# Patient Record
Sex: Male | Born: 1935
Health system: Southern US, Community
[De-identification: ages and names within clinical notes are randomized; demographics above are authoritative.]

## PROBLEM LIST (undated history)

## (undated) DIAGNOSIS — D649 Anemia, unspecified: Secondary | ICD-10-CM

## (undated) DIAGNOSIS — N189 Chronic kidney disease, unspecified: Secondary | ICD-10-CM

## (undated) DIAGNOSIS — G709 Myoneural disorder, unspecified: Secondary | ICD-10-CM

## (undated) DIAGNOSIS — F329 Major depressive disorder, single episode, unspecified: Secondary | ICD-10-CM

## (undated) DIAGNOSIS — I10 Essential (primary) hypertension: Secondary | ICD-10-CM

## (undated) DIAGNOSIS — C801 Malignant (primary) neoplasm, unspecified: Secondary | ICD-10-CM

## (undated) DIAGNOSIS — F32A Depression, unspecified: Secondary | ICD-10-CM

## (undated) HISTORY — PX: EYE SURGERY: SHX253

## (undated) MED FILL — Iron Sucrose Inj 20 MG/ML (Fe Equiv): INTRAVENOUS | Qty: 10 | Status: AC

---

## 2006-08-23 ENCOUNTER — Ambulatory Visit: Payer: Self-pay

## 2006-08-23 IMAGING — CR DG CHEST 2V
1 series · 3 of 3 positions shown · non-contrast
Comparison: none

REASON FOR EXAM: COUGH, FEVER, WEAKNESS
COMMENTS:

[Series 1: view not recorded · 0.17mm/px · 3 of 3 slices shown]
[im 1/3]
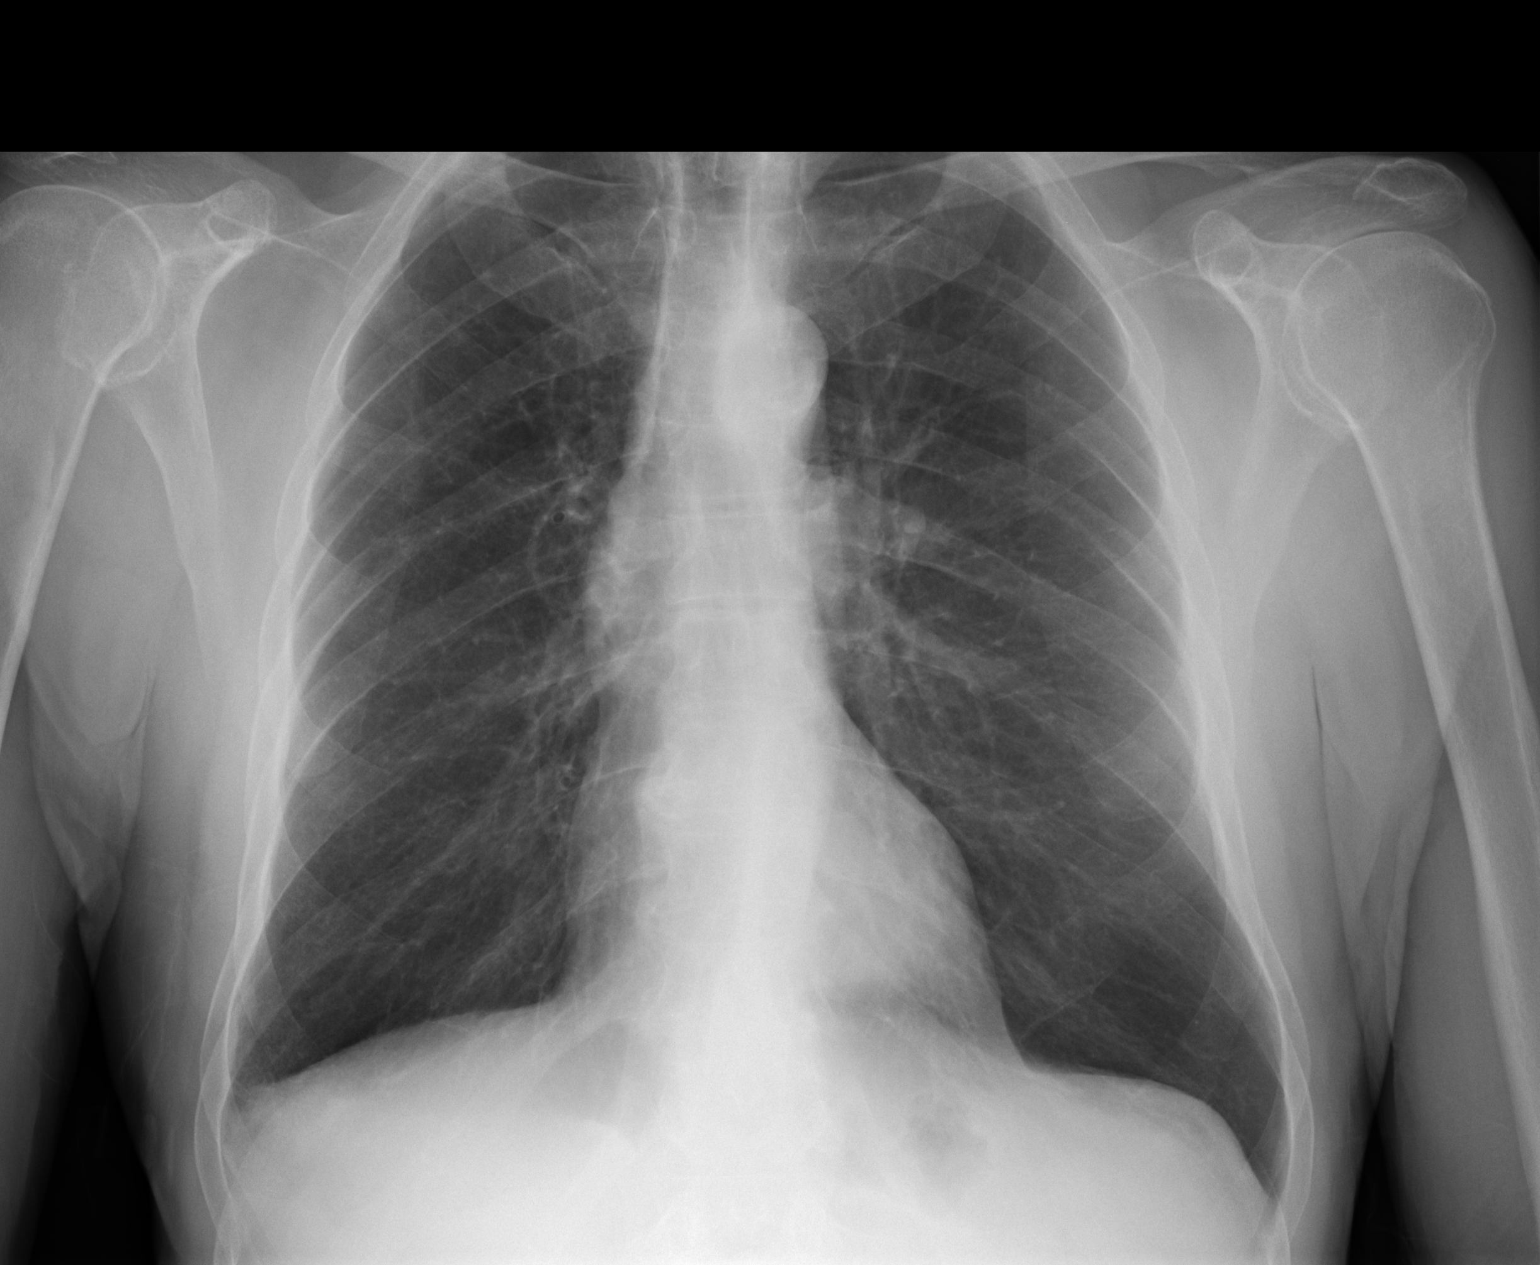
[im 2/3]
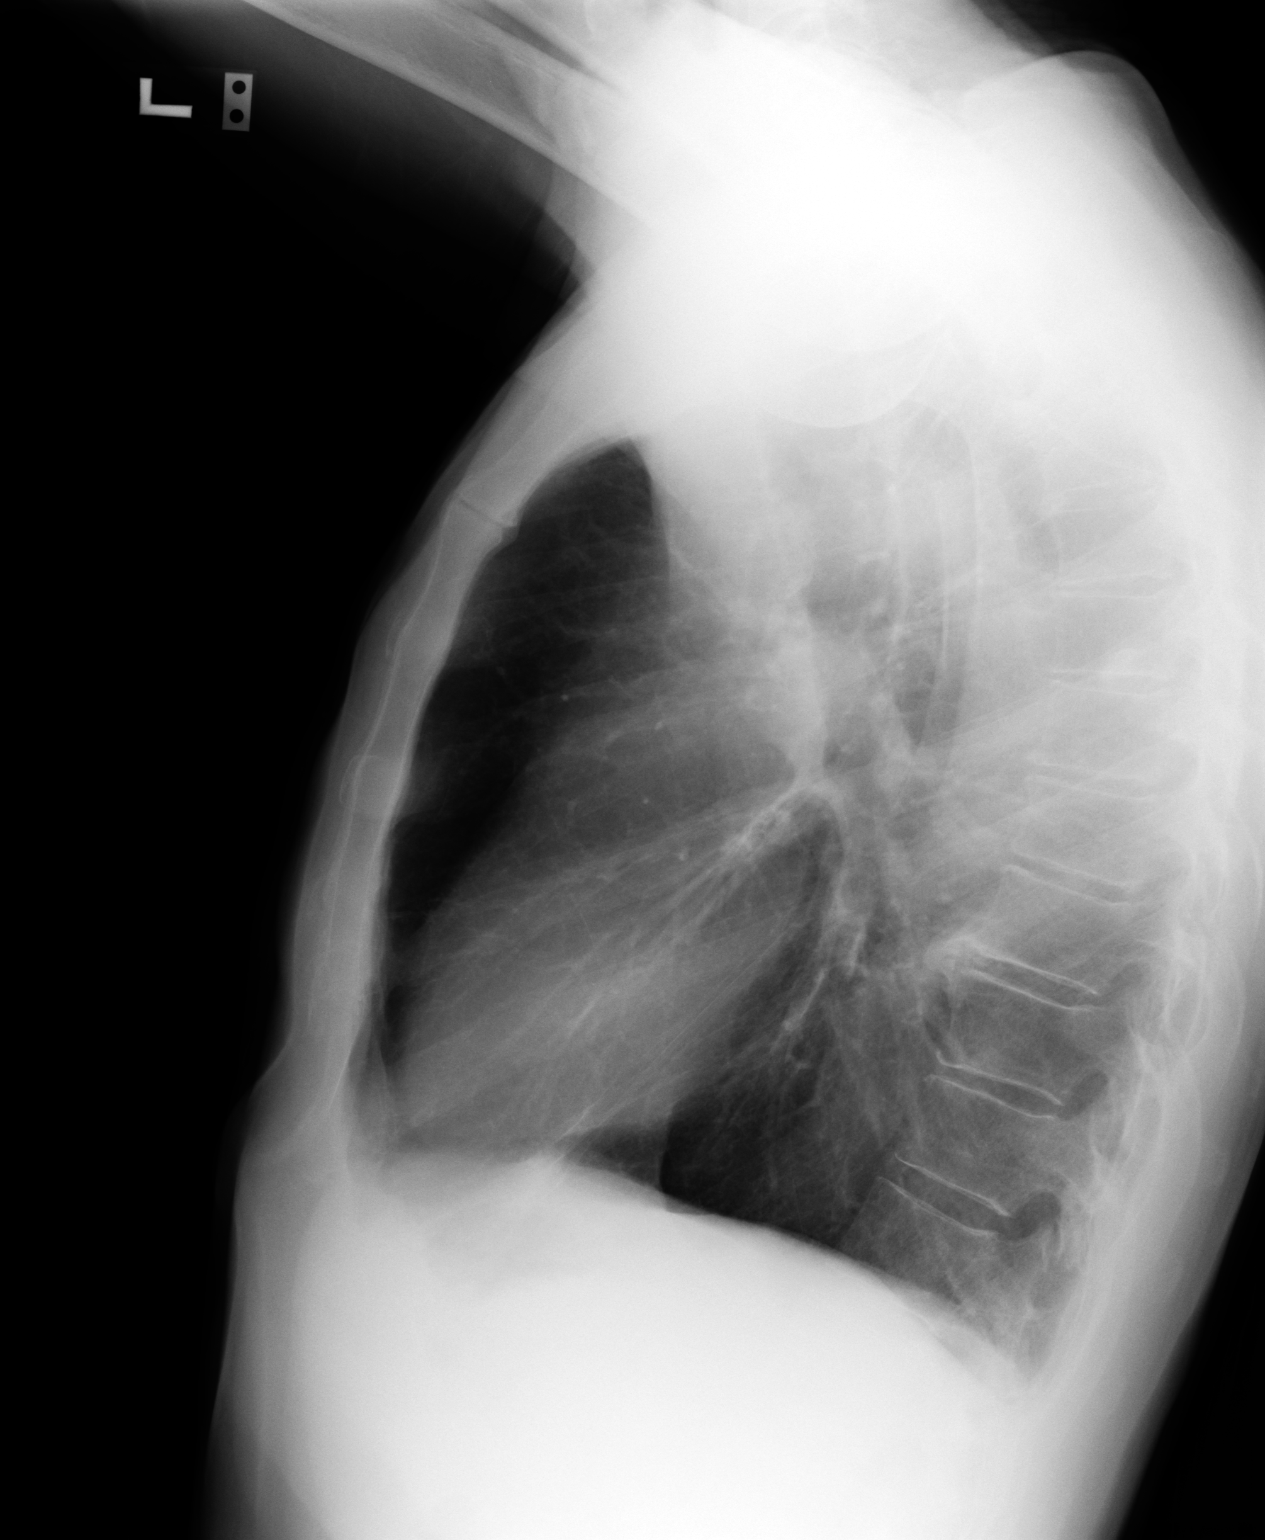
[im 3/3]
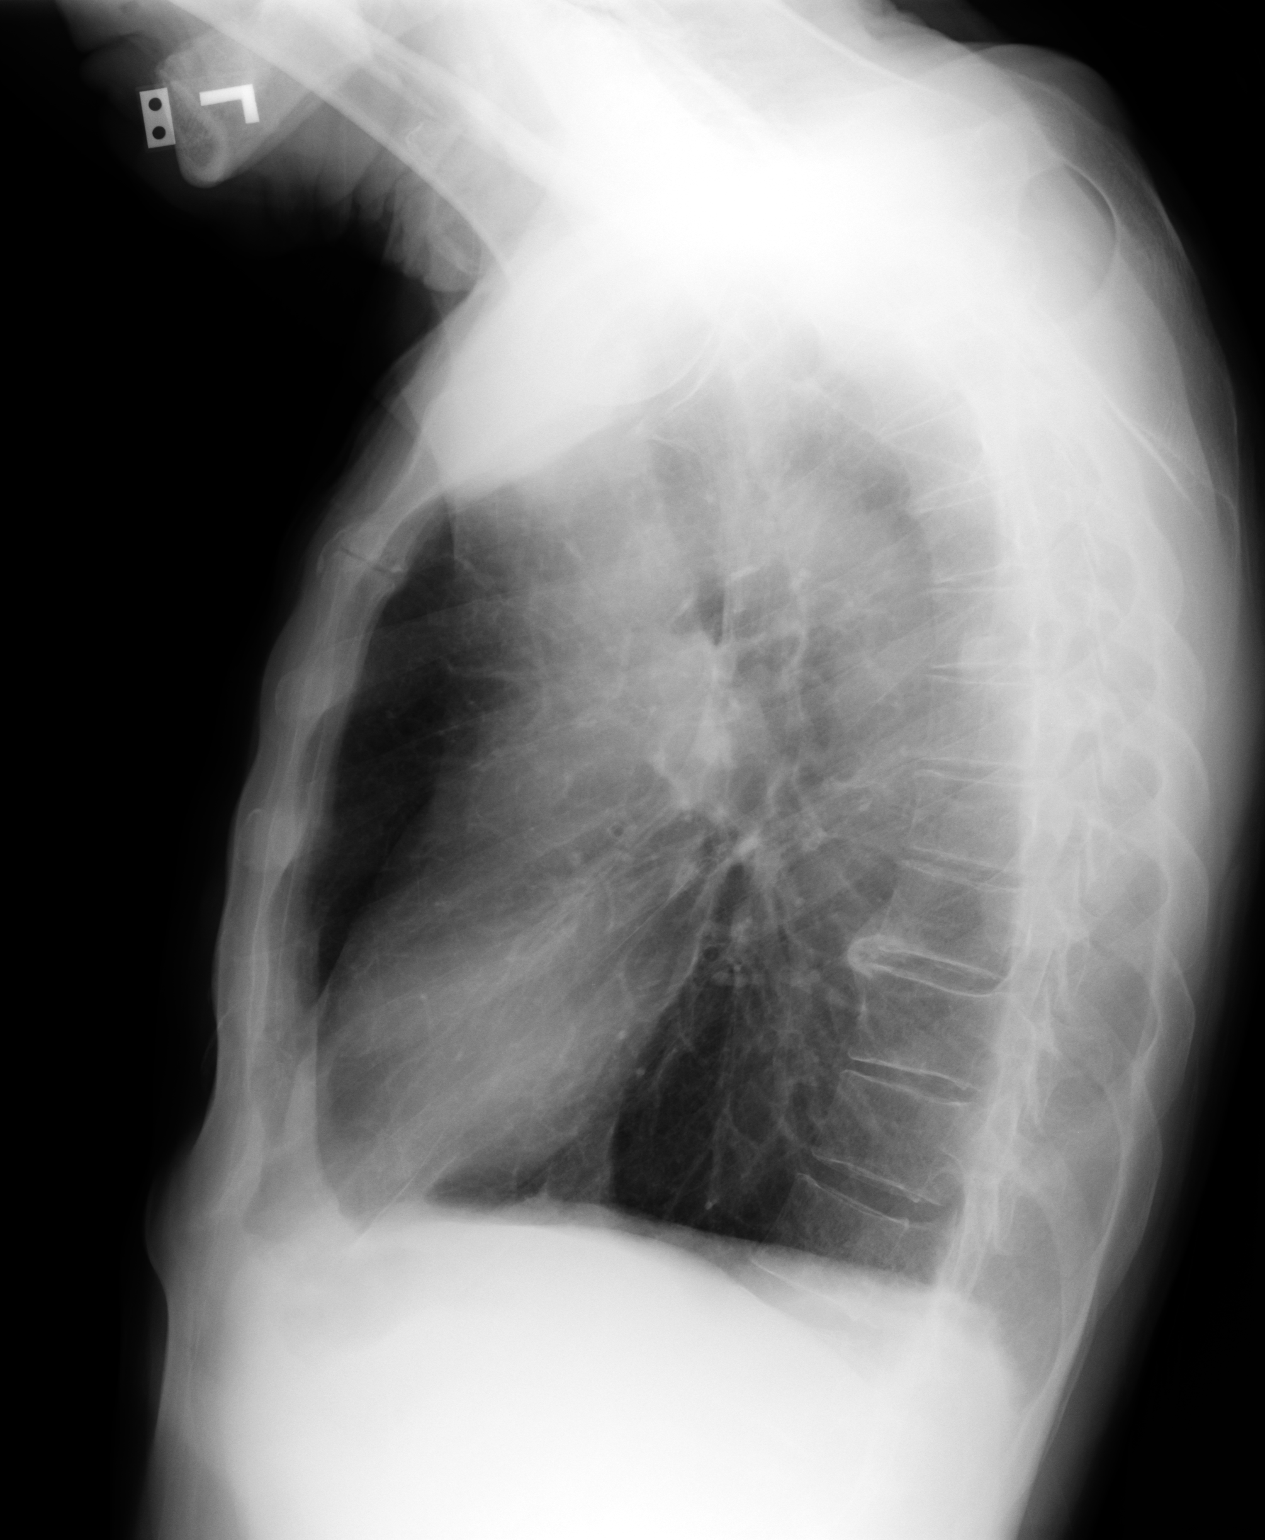

[3 of 3 positions shown; findings below may reference images not displayed]

PROCEDURE:     DXR - DXR CHEST PA (OR AP) AND LATERAL  - [DATE]  [DATE]

RESULT:     The lung fields are clear. No pneumonia, pneumothorax, or
pleural effusion is seen. Heart size is normal. The lungs are hyperexpanded
compatible with a history of COPD. There is slight degenerative spurring at
the midthoracic spine.
IMPRESSION: 1. The lung fields are clear of infiltrate.
2. The chest is hyperexpanded compatible with COPD.

## 2008-07-01 ENCOUNTER — Inpatient Hospital Stay: Payer: Self-pay | Admitting: Internal Medicine

## 2008-07-01 IMAGING — CR DG CHEST 2V
1 series · 3 of 3 positions shown · non-contrast
Comparison: none

REASON FOR EXAM: syncope
COMMENTS:

PROCEDURE:     DXR - DXR CHEST PA (OR AP) AND LATERAL  - [DATE]  [DATE]
RESULT:     The lungs are hyperinflated consistent with COPD. There is mild
hemidiaphragm flattening. The heart is not enlarged. The pulmonary
vascularity is not engorged.

[Series 1: view not recorded · 0.17mm/px · 3 of 3 slices shown]
[im 1/3]
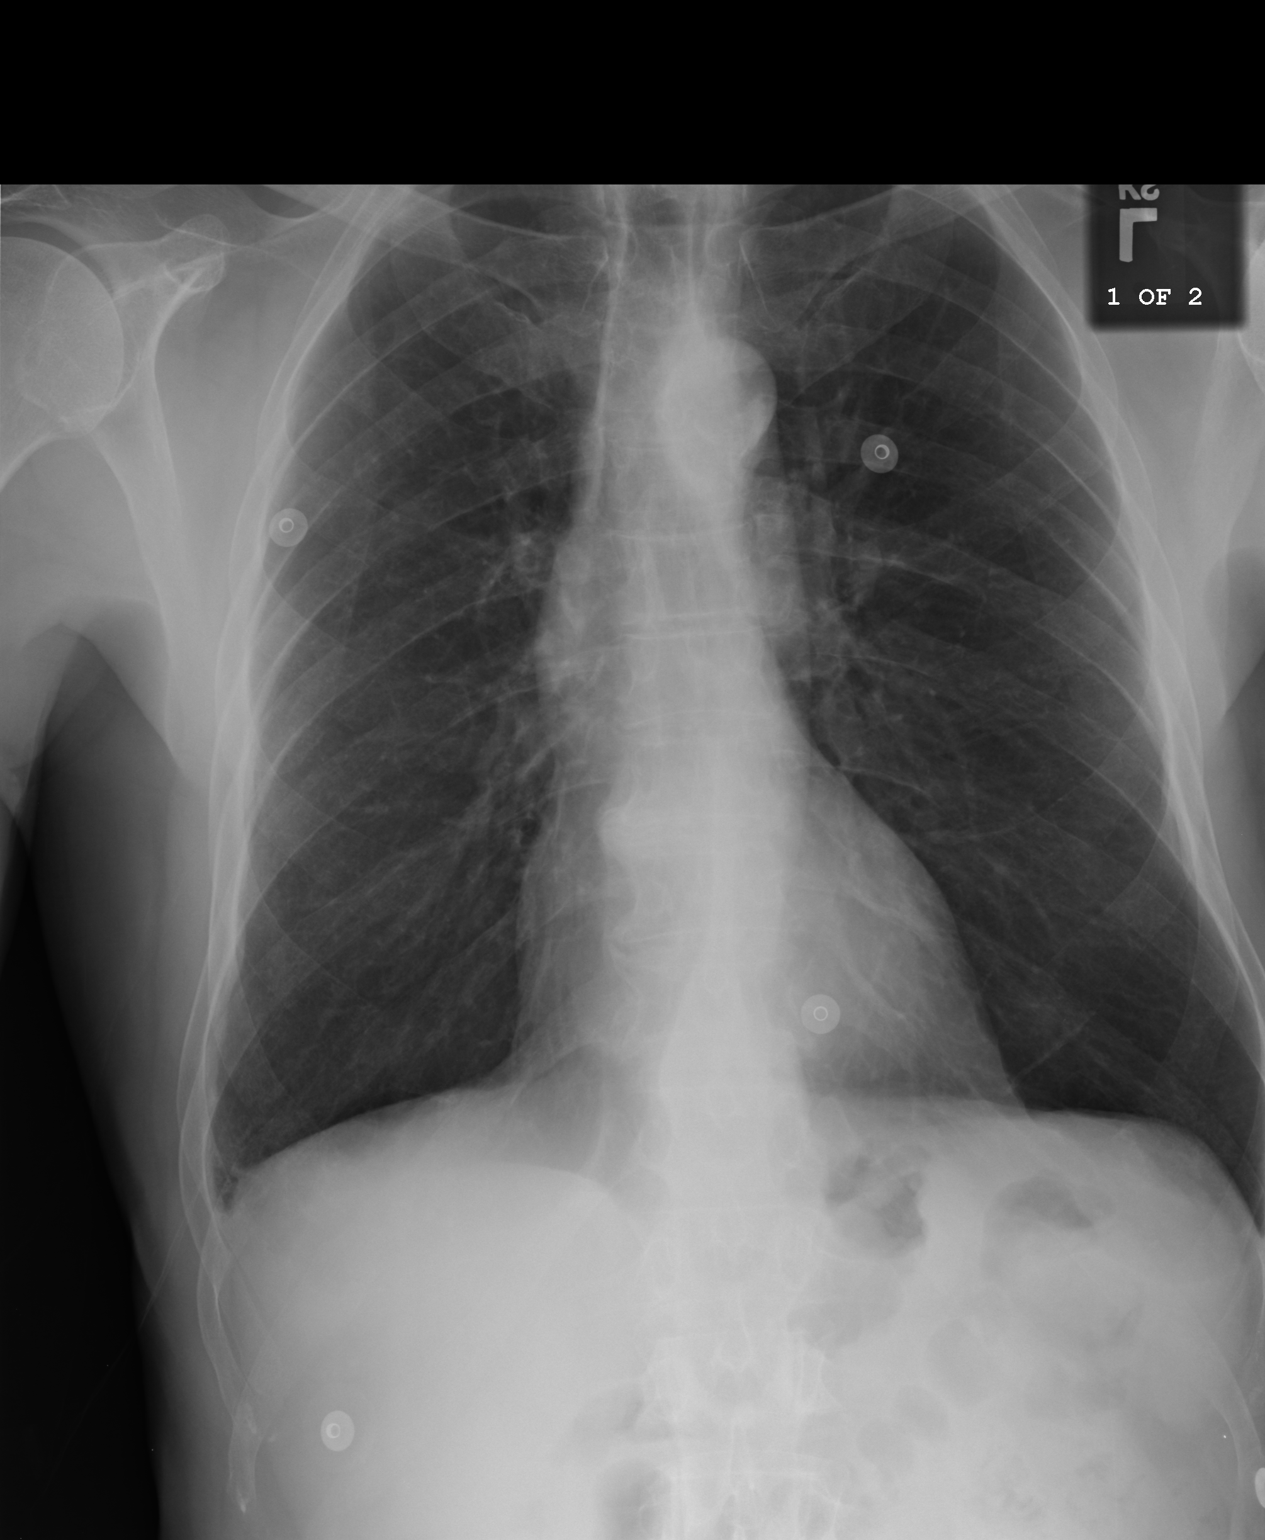
[im 2/3]
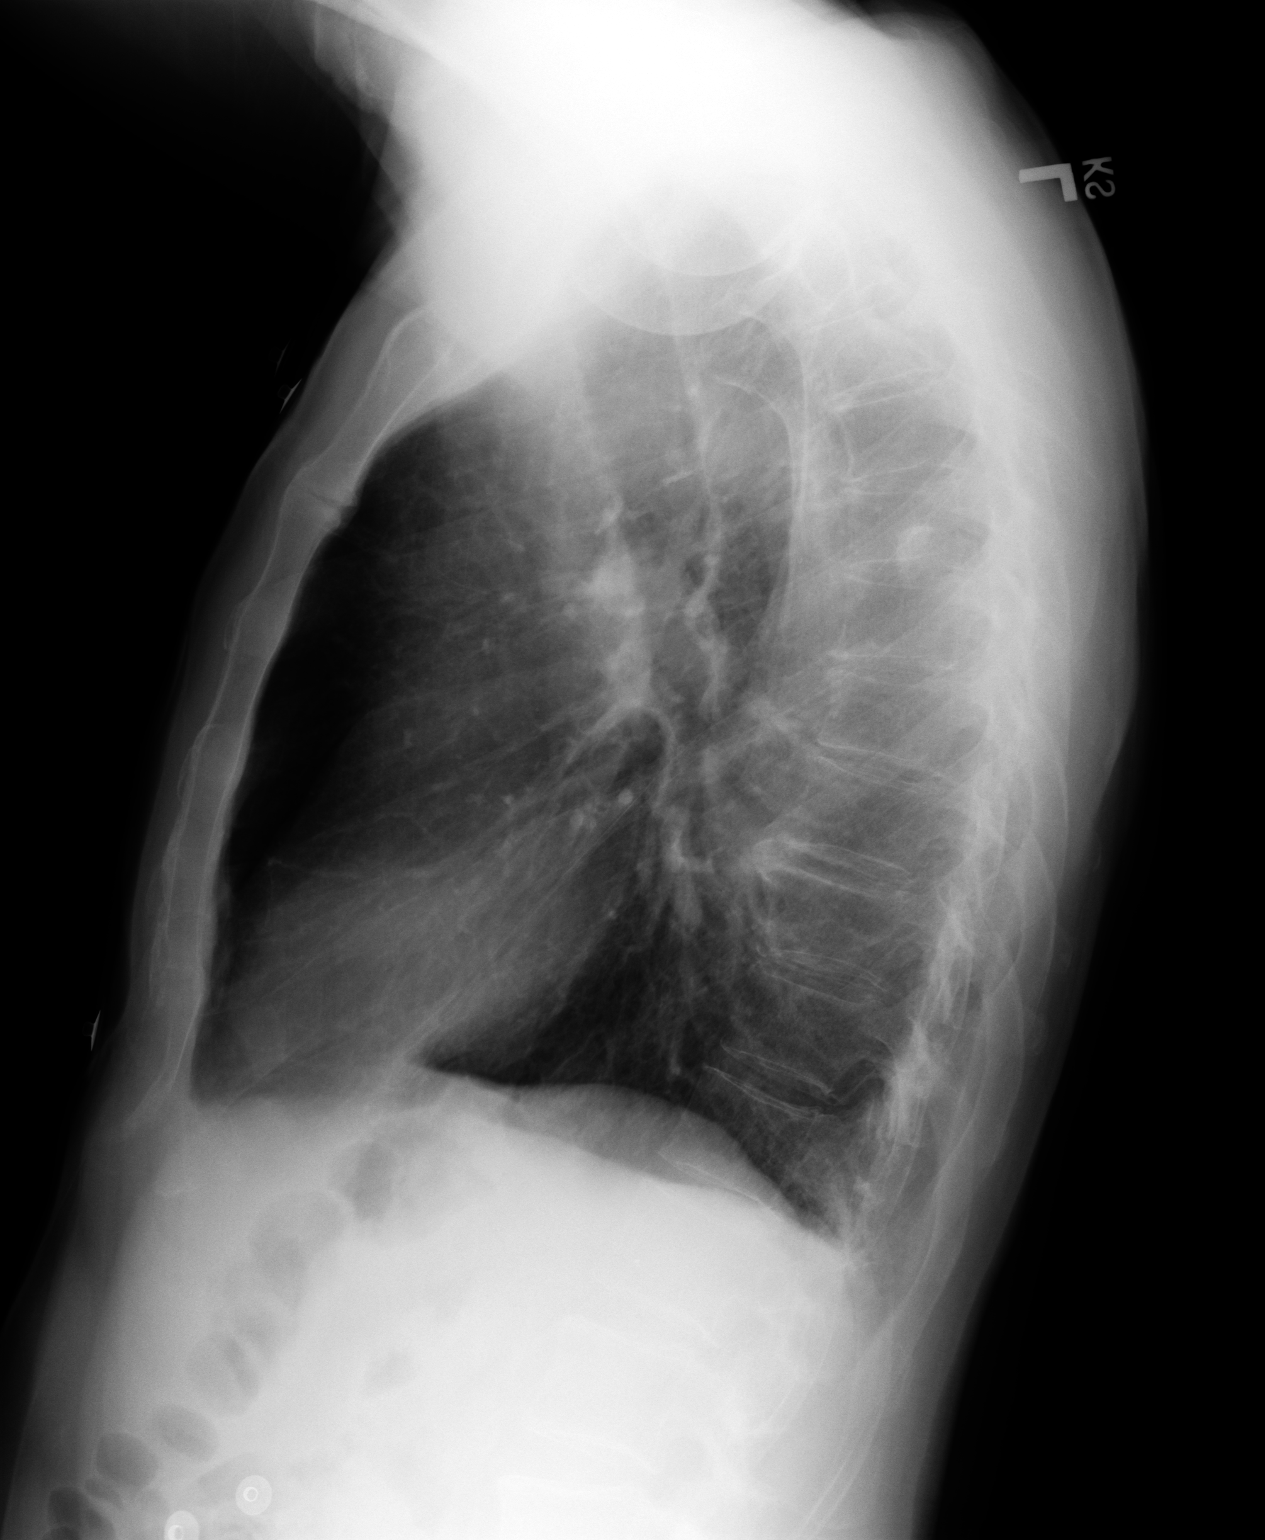
[im 3/3]
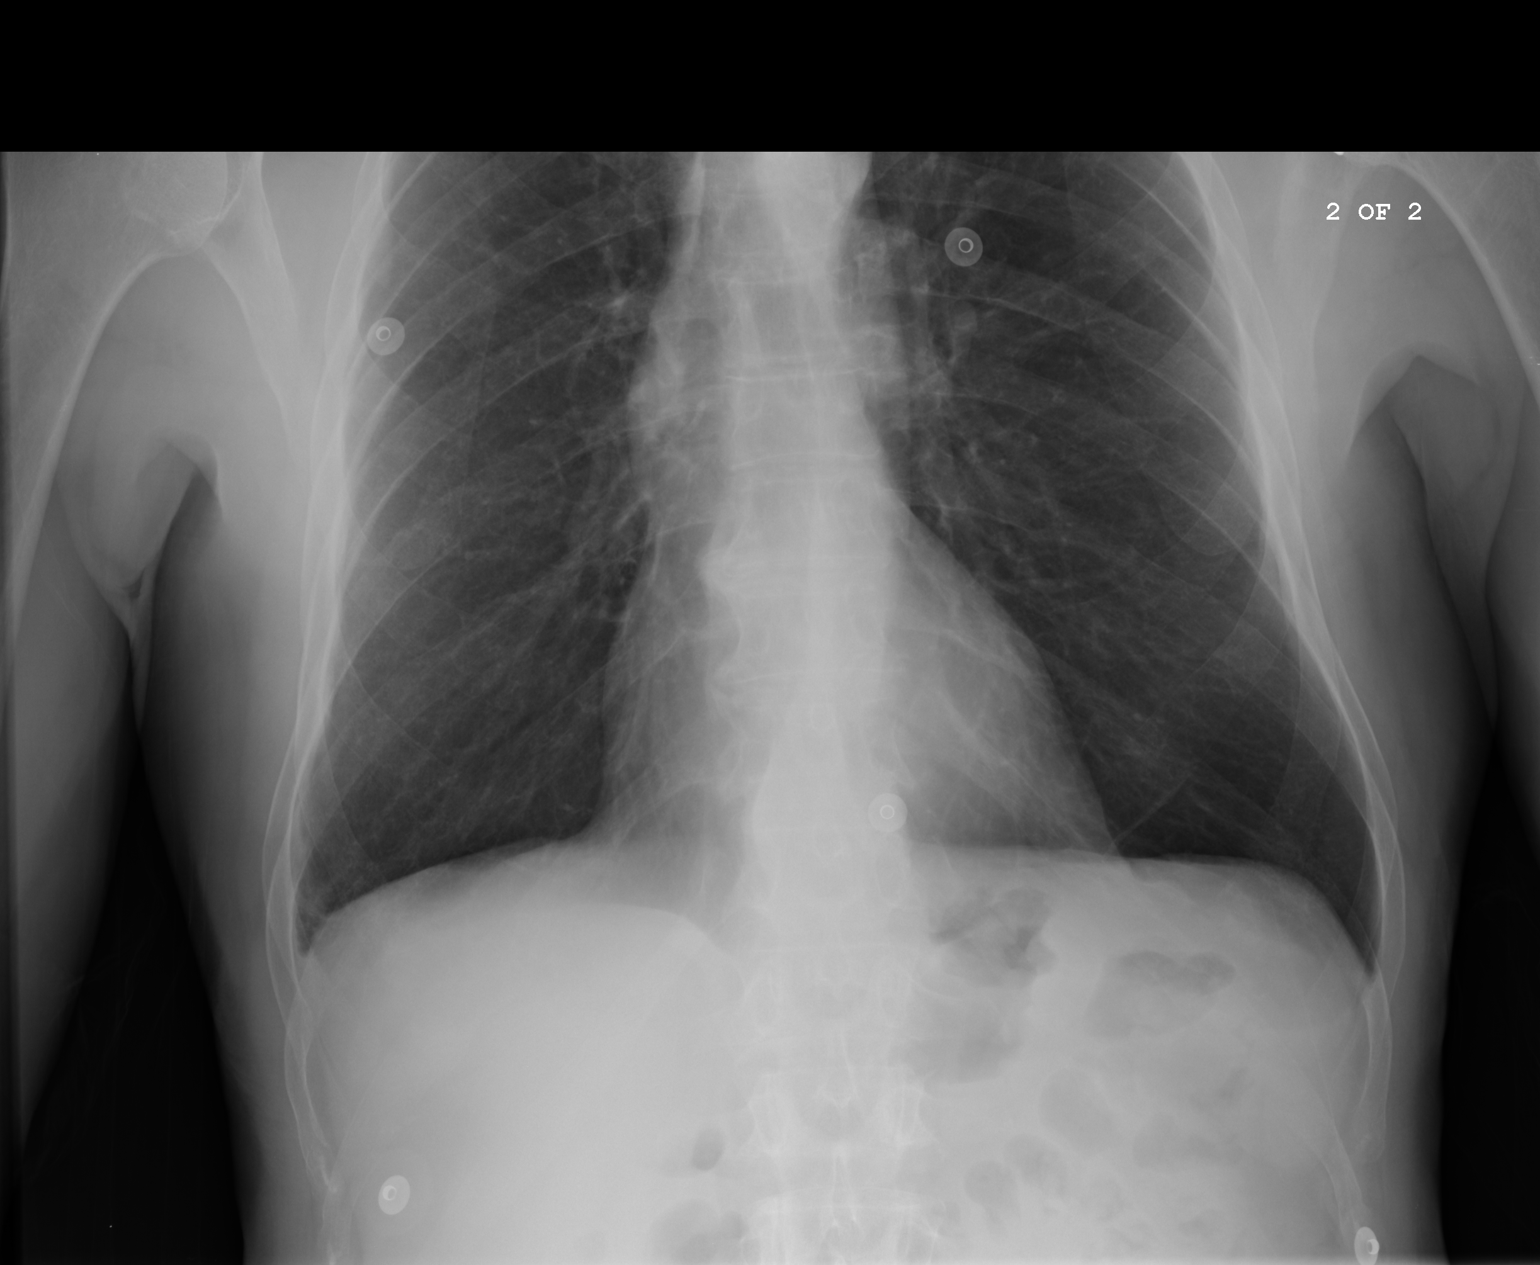

[3 of 3 positions shown; findings below may reference images not displayed]

IMPRESSION: There are findings consistent with COPD. I do not see evidence of congestive
heart failure, pneumonia or other acute cardiopulmonary abnormality.

## 2008-07-01 IMAGING — CT CT HEAD WITHOUT CONTRAST
2 series · 15 of 30 positions shown, 19 images · non-contrast
Comparison: none

REASON FOR EXAM: syncope
COMMENTS:

[Series 2: without · axial · non-contrast · 0.40mm/px · z∈[+1142,+1262]mm · 13 of 29 slices shown, 17 images]
[im 3/29  brain]
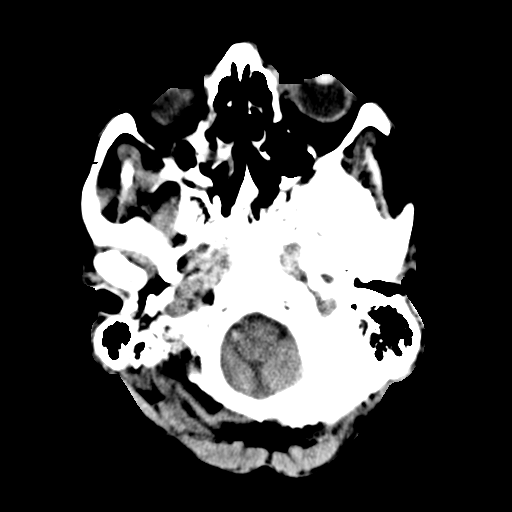
[im 3/29  bone]
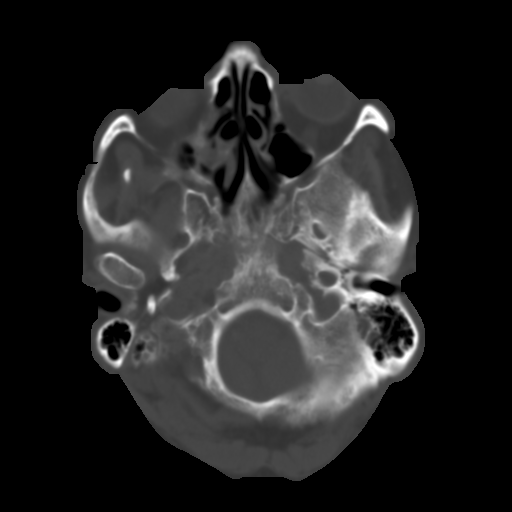
[im 5/29  brain]
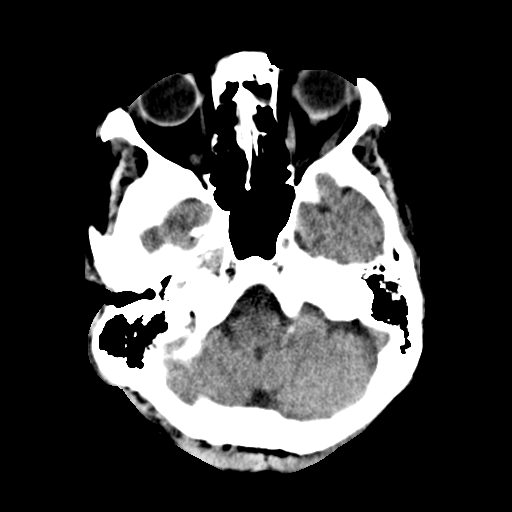
[im 7/29  brain]
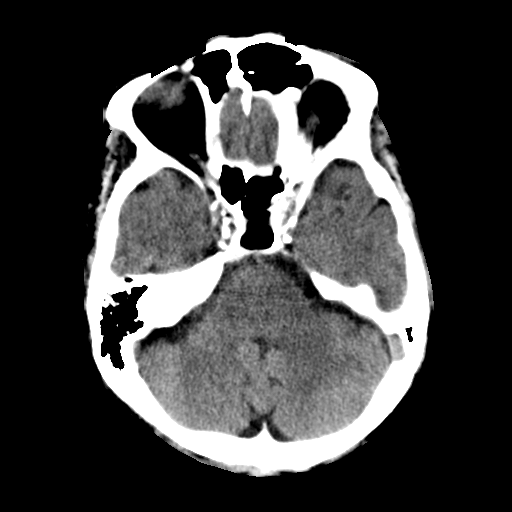
[im 9/29  brain]
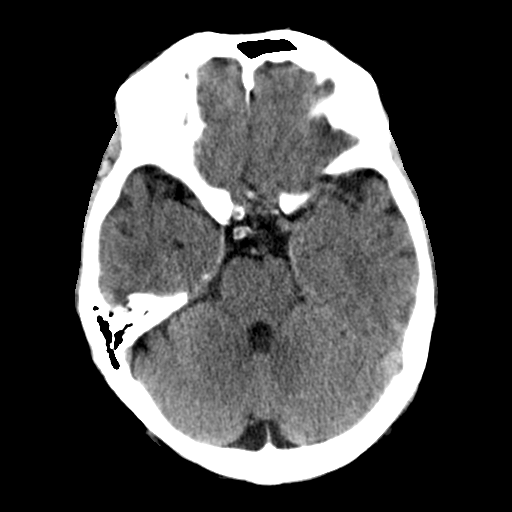
[im 11/29  brain]
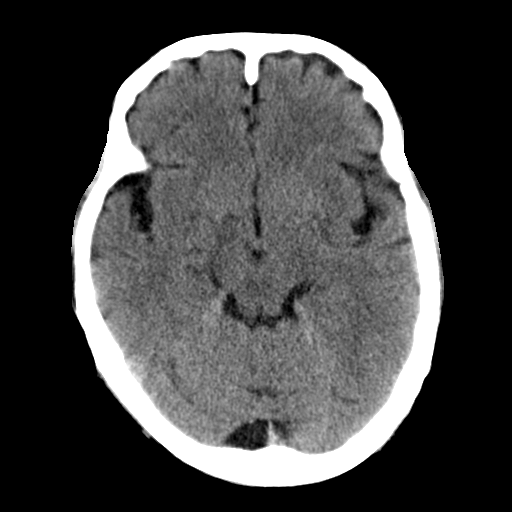
[im 11/29  bone]
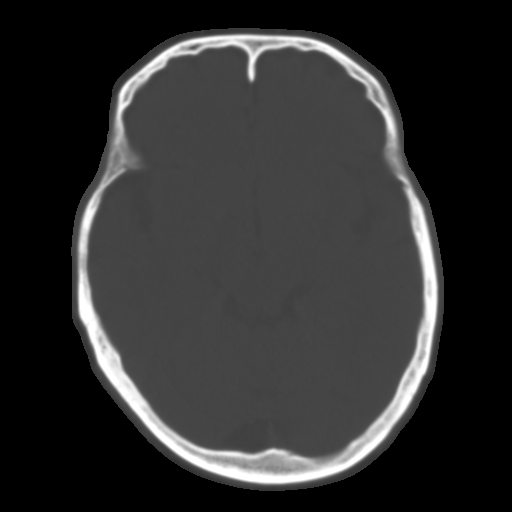
[im 13/29  brain]
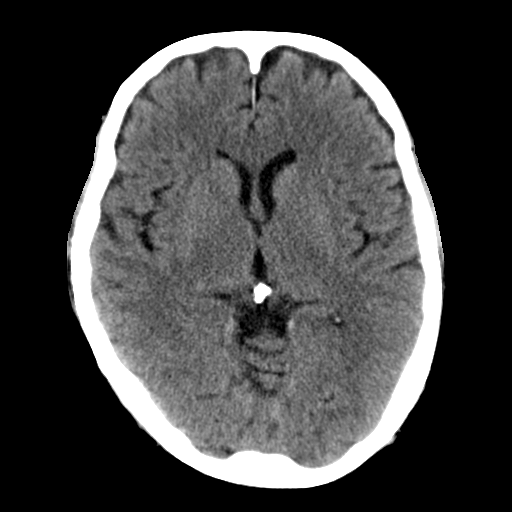
[im 15/29  brain]
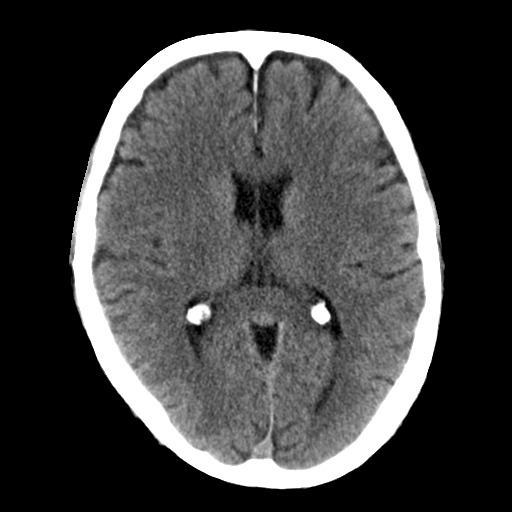
[im 17/29  brain]
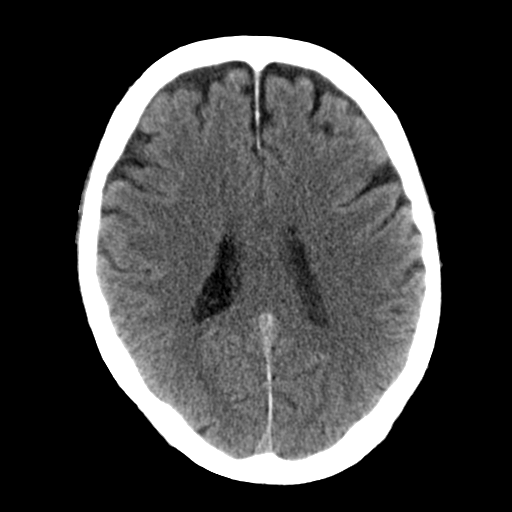
[im 19/29  brain]
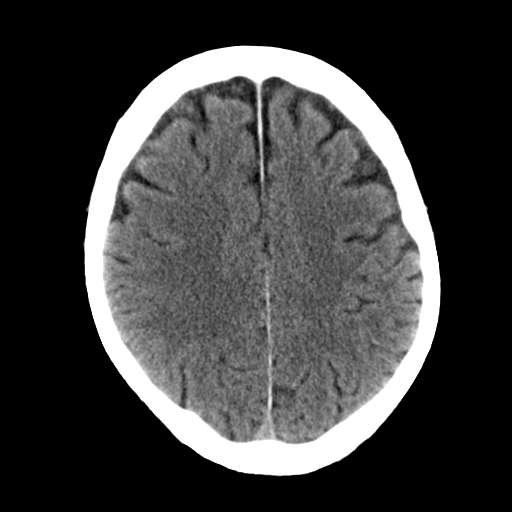
[im 19/29  bone]
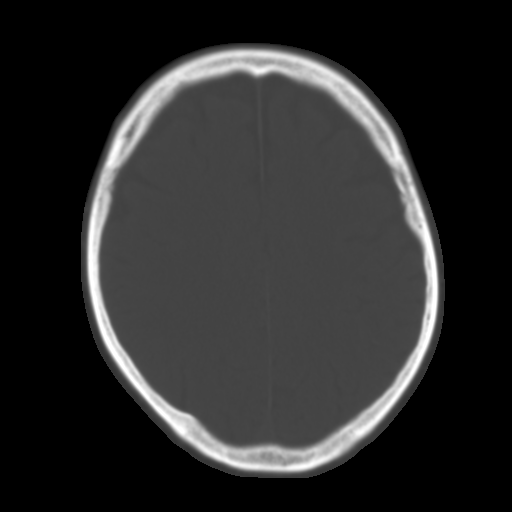
[im 21/29  brain]
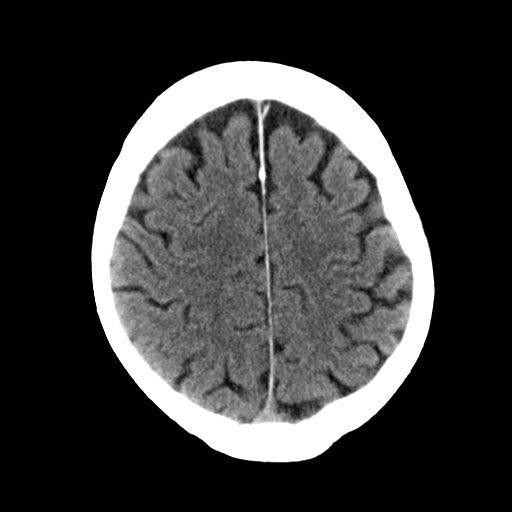
[im 23/29  brain]
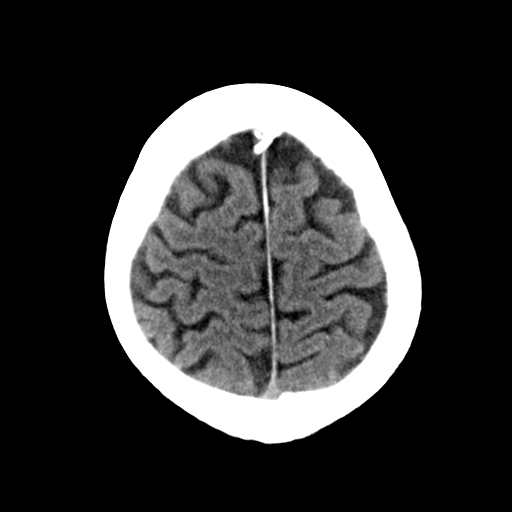
[im 25/29  brain]
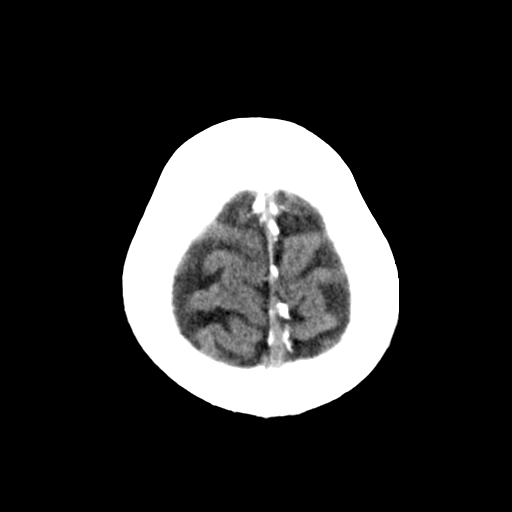
[im 27/29  brain]
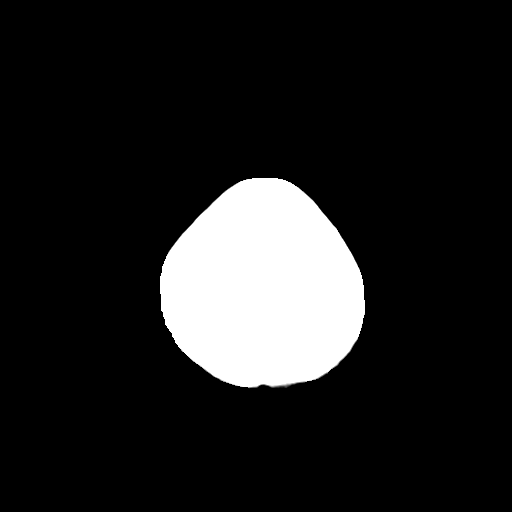
[im 27/29  bone]
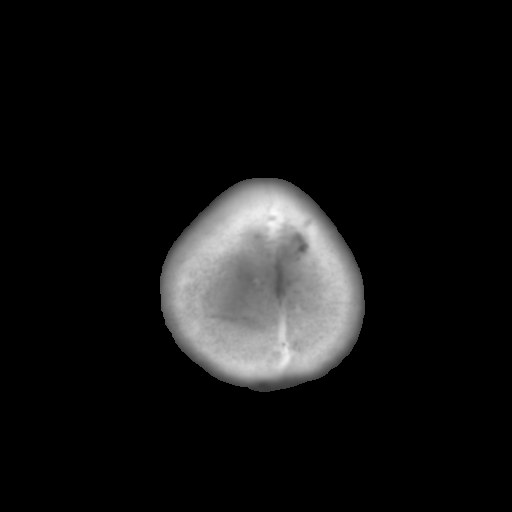

[Series 3: bone · axial · 0.40mm/px · z∈[+1142,+1162]mm · 2 of 29 slices shown]
[im 3/29  bone]
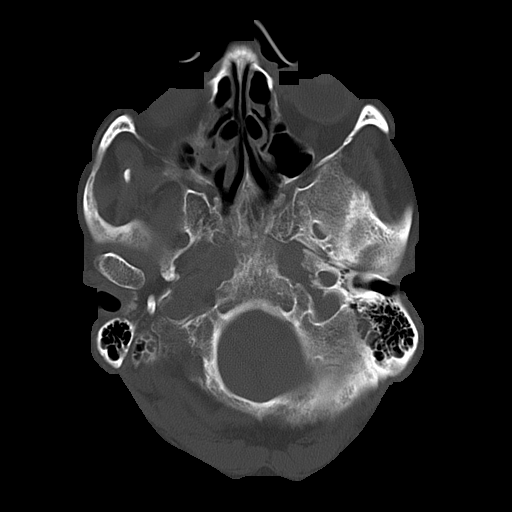
[im 7/29  bone]
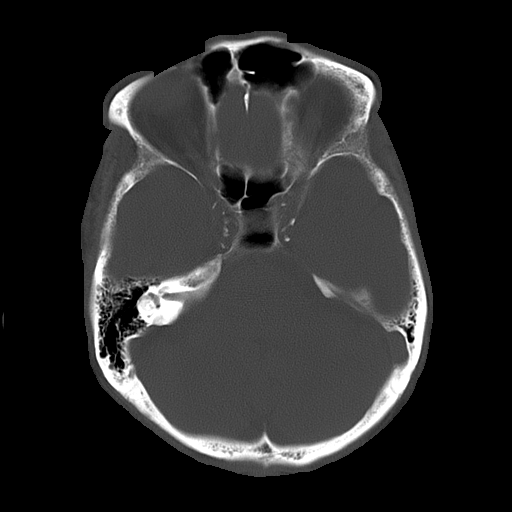

[15 of 30 positions shown; findings below may reference images not displayed]

PROCEDURE:     CT  - CT HEAD WITHOUT CONTRAST  - [DATE]  [DATE]

RESULT:     Axial imaging was performed through the brain at 5 mm intervals
and slice thicknesses. Images were reviewed at soft tissue and bone window
settings.

The ventricles are normal in size and position. There is moderate diffuse
cerebral and cerebellar atrophy present consistent with the patient's age.
There is no intracranial hemorrhage, mass, or mass-effect. At bone window
settings there is an air-fluid level in the right maxillary sinus. There are
small amounts of mucoperiosteal thickening within the ethmoid sinuses and a
portion of the right sphenoid sinus. The frontal sinuses are clear.
IMPRESSION: 1. I do not see evidence of an acute ischemic or hemorrhagic event. There
are mild age-appropriate atrophic changes present.
2. No acute skull fractures are identified.
3. There is an air-fluid level in the right maxillary sinus and there is
mucoperiosteal thickening noted within the ethmoid and a right sphenoid
sinus cell. This may reflect acute and chronic sinus inflammation

This report was called to the [HOSPITAL] the conclusion of the
study.

## 2008-07-02 IMAGING — US US RENAL KIDNEY
1 series · 17 of 25 positions shown · non-contrast
Comparison: none

REASON FOR EXAM: ARF
COMMENTS:

[Series 1: us renal kidney · 17 of 33 slices shown]
[im 1/33]
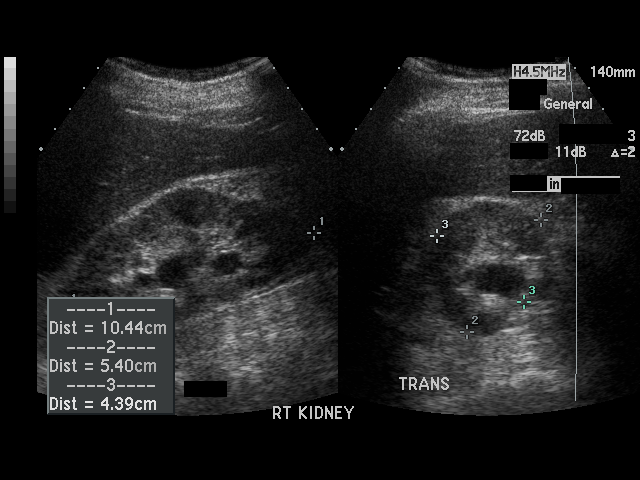
[im 3/33]
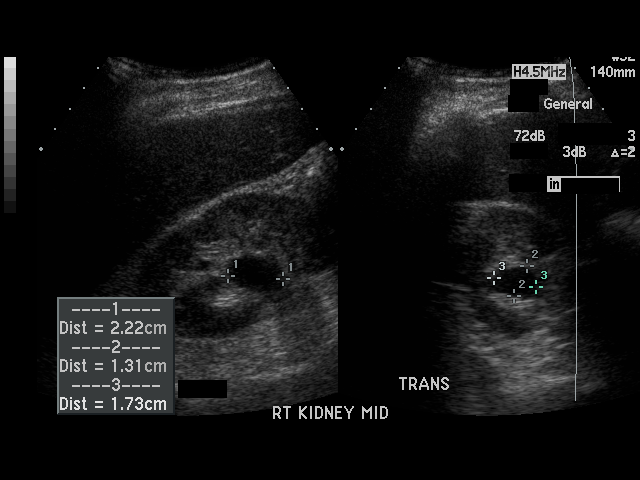
[im 5/33]
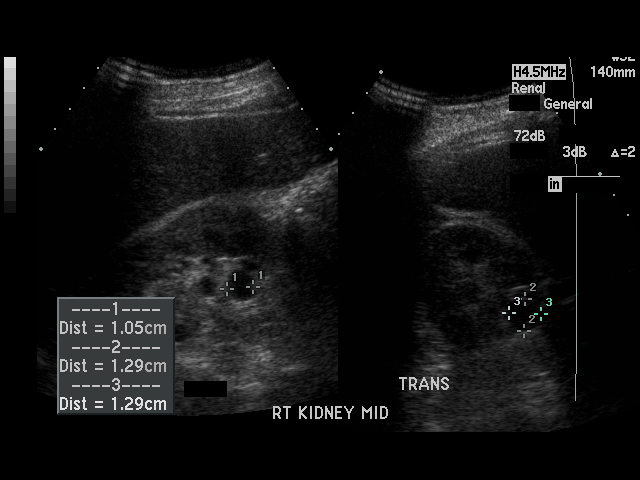
[im 7/33]
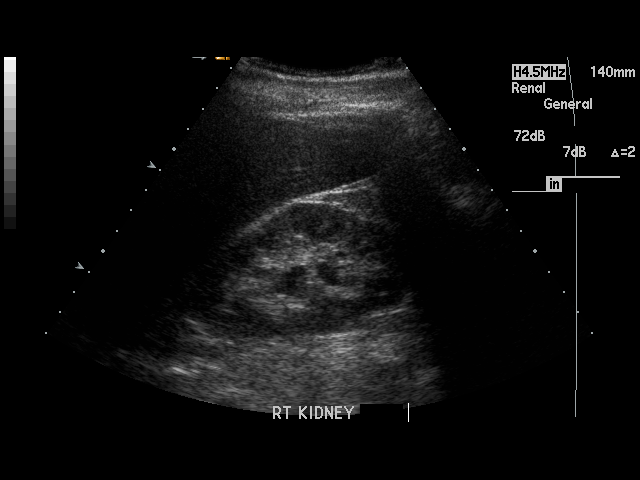
[im 9/33]
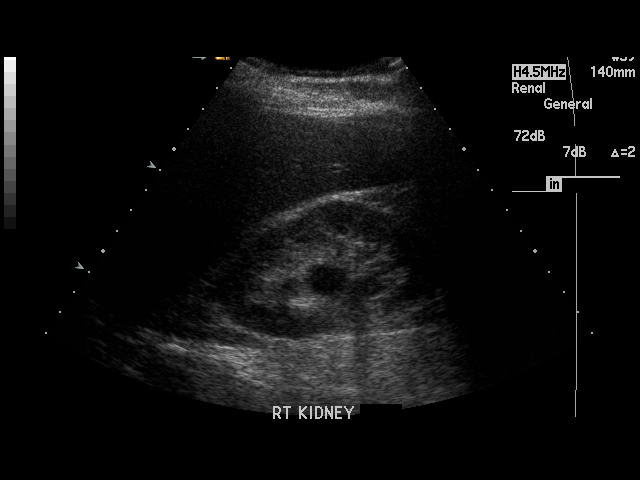
[im 11/33]
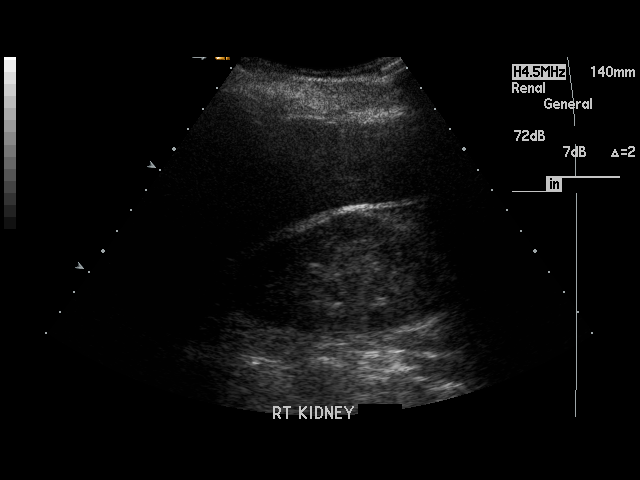
[im 13/33]
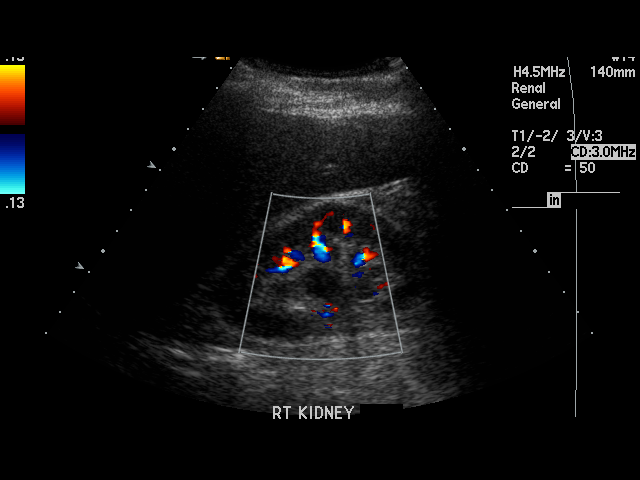
[im 15/33]
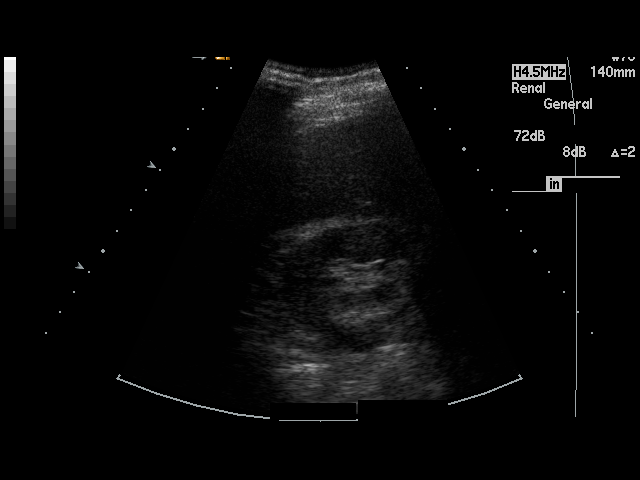
[im 17/33]
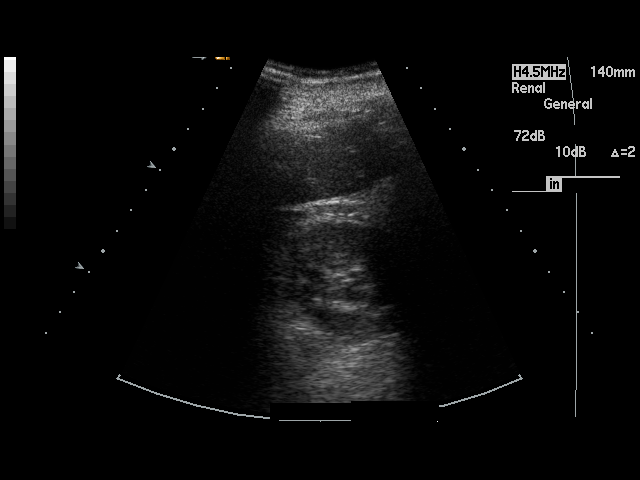
[im 18/33]
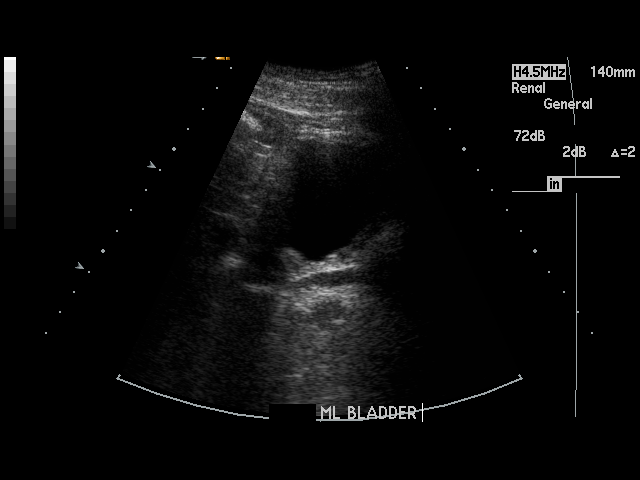
[im 21/33]
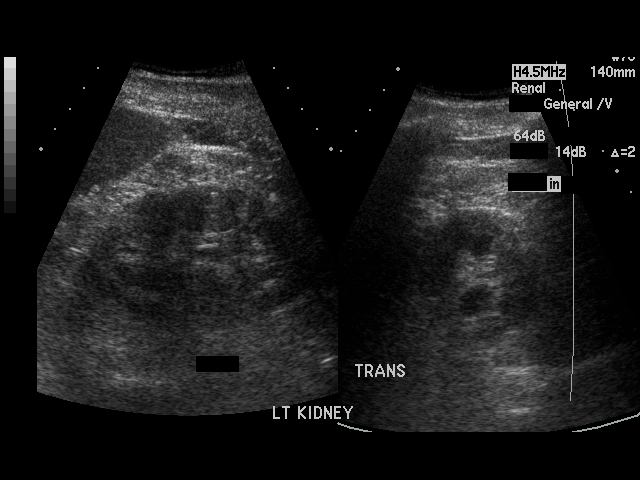
[im 22/33]
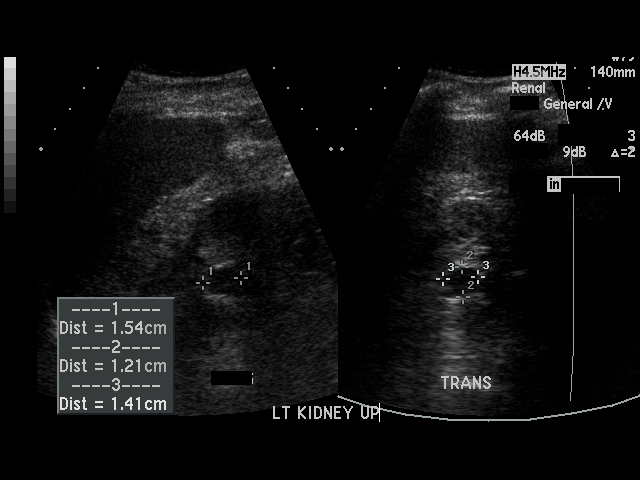
[im 25/33]
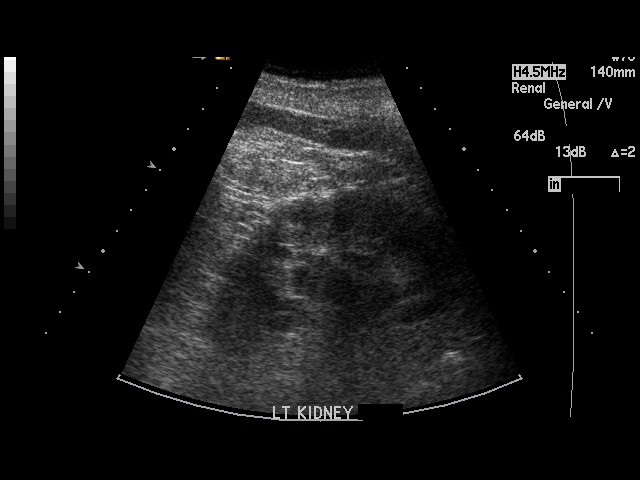
[im 26/33]
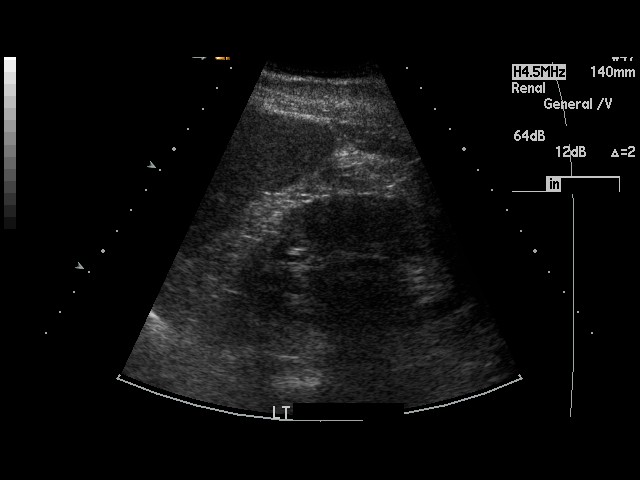
[im 29/33]
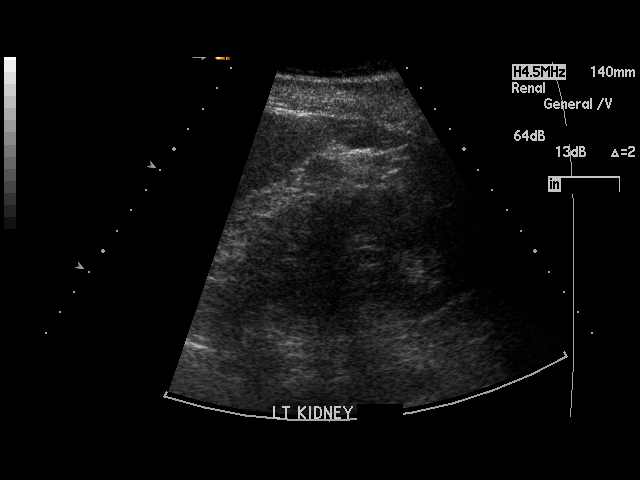
[im 30/33]
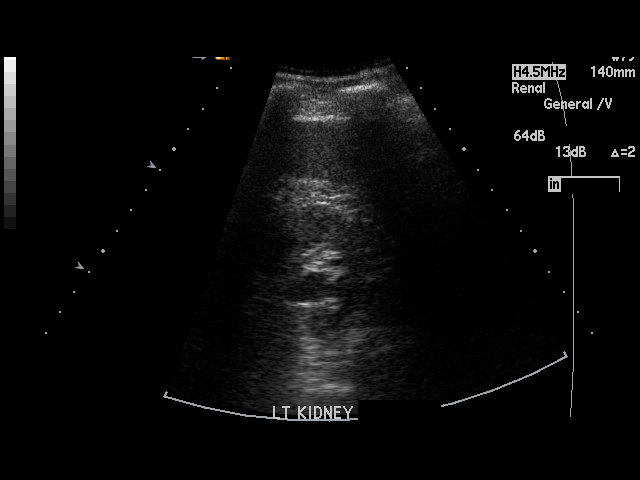
[im 33/33]
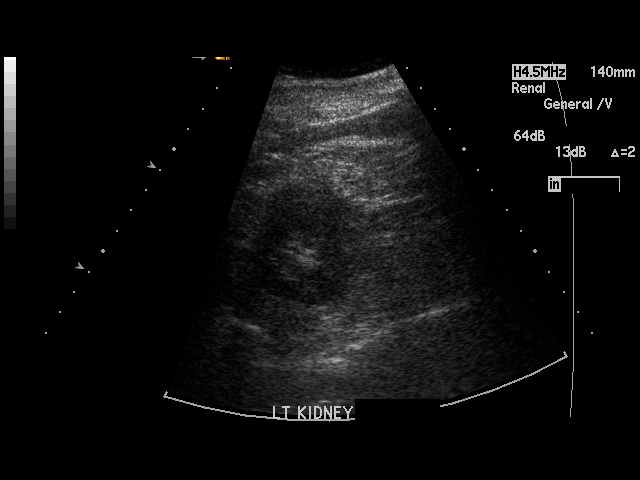

[17 of 25 positions shown; findings below may reference images not displayed]

PROCEDURE:     US  - US KIDNEY  - [DATE] [DATE]

RESULT:     Ultrasound evaluation of the kidneys demonstrates bilateral
renal cysts. Parapelvic cyst on the RIGHT measures up to 2.2 cm in diameter.
LEFT renal cyst is present measuring up approximately 1.5 cm in diameter.
The RIGHT kidney measures 10.4 x 5.4 x 4.4 cm. The LEFT kidney measures
approximately 10.1 x 5.2 x 4.5 cm. There is no obstruction. No definite
stones are identified.
IMPRESSION: Renal cysts present bilaterally. No other significant abnormality.

## 2008-07-02 IMAGING — US US CAROTID DUPLEX BILAT
1 series · 17 of 24 positions shown · non-contrast
Comparison: none

REASON FOR EXAM: syncope
COMMENTS:

[Series 1: us carotid duplex bilat · 17 of 62 slices shown]
[im 1/62]
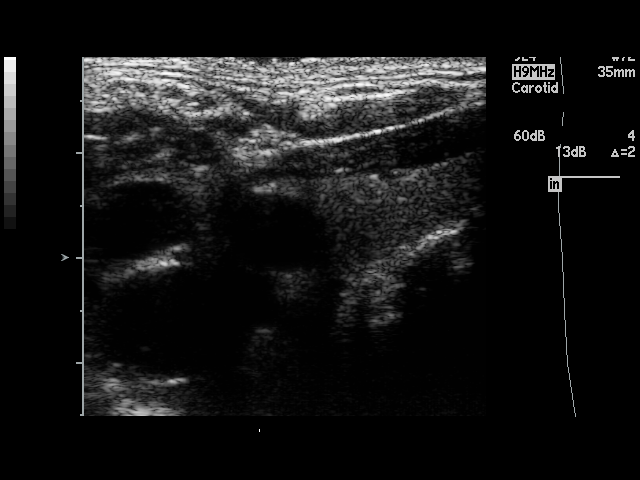
[im 6/62]
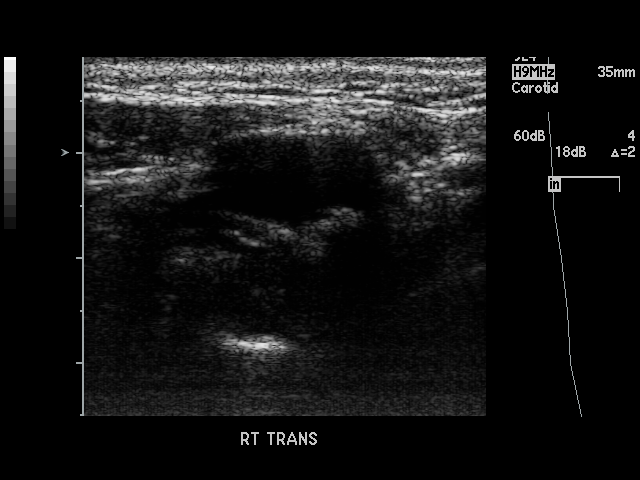
[im 8/62]
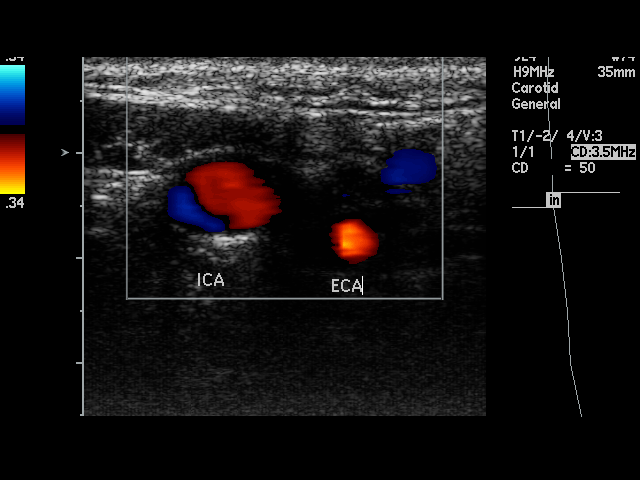
[im 11/62]
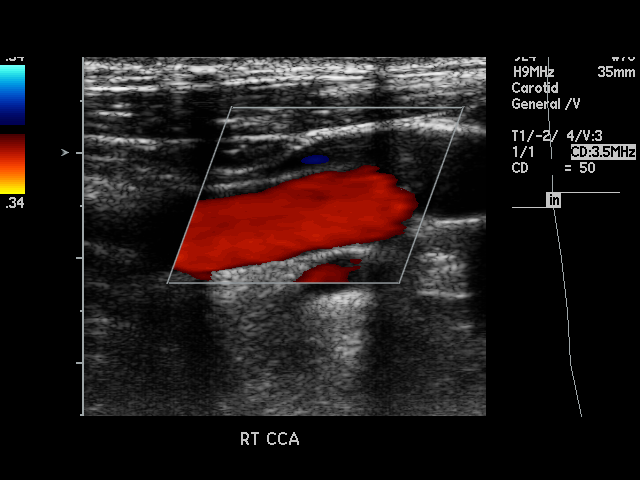
[im 16/62]
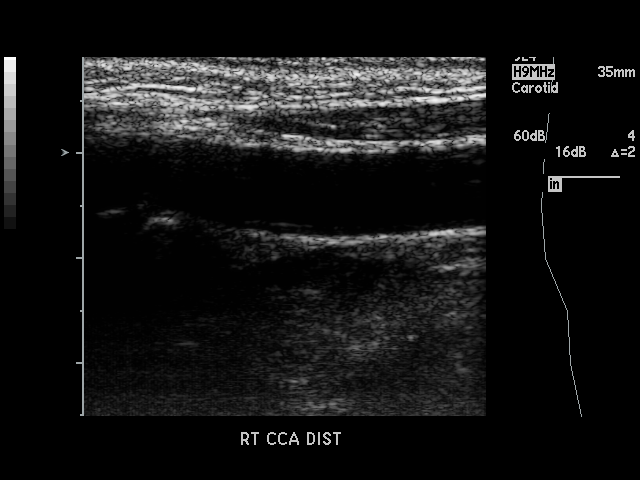
[im 19/62]
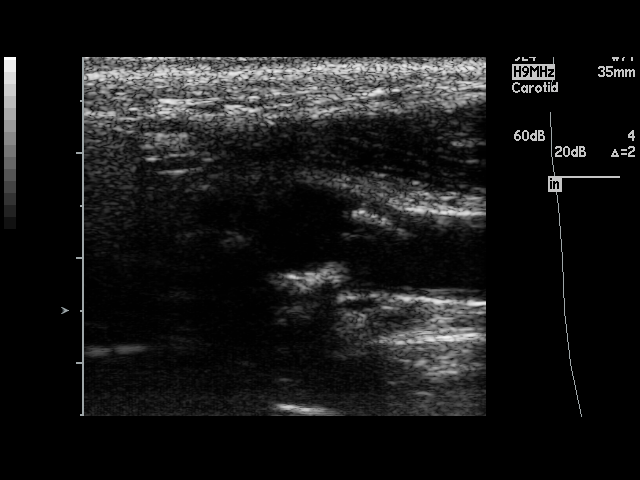
[im 24/62]
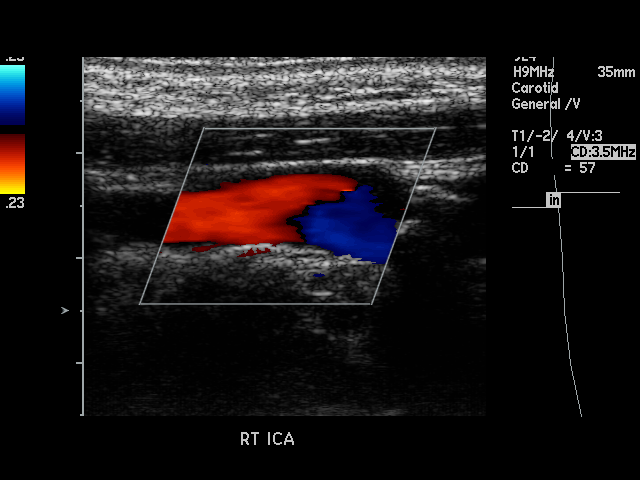
[im 27/62]
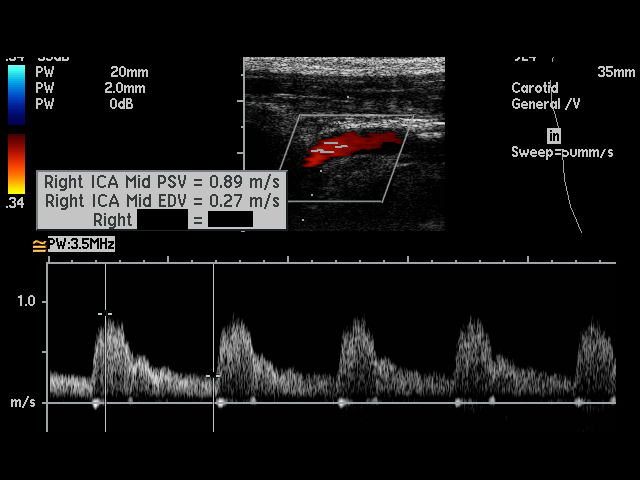
[im 32/62]
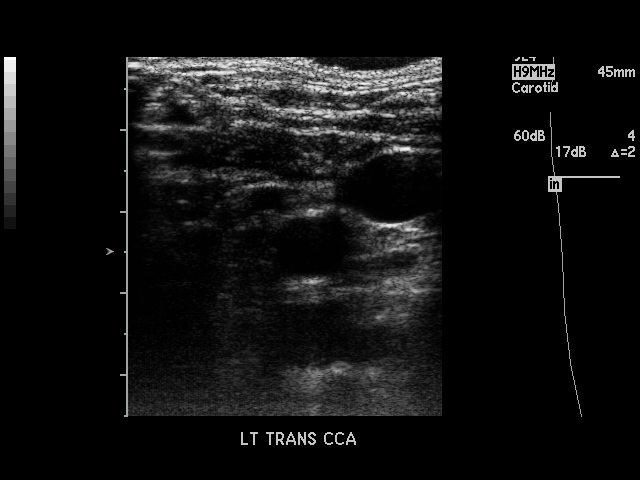
[im 35/62]
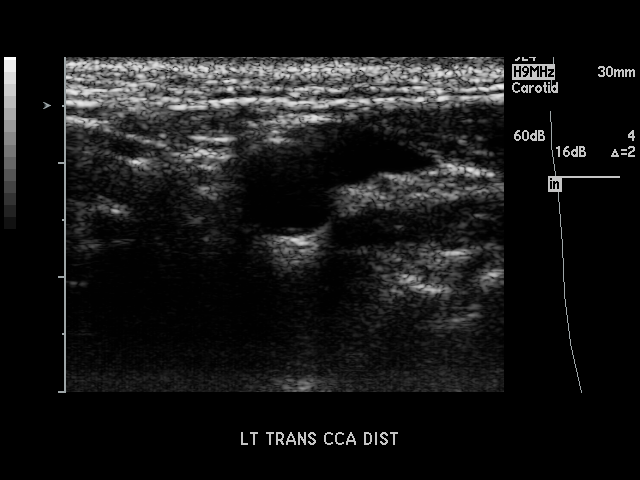
[im 38/62]
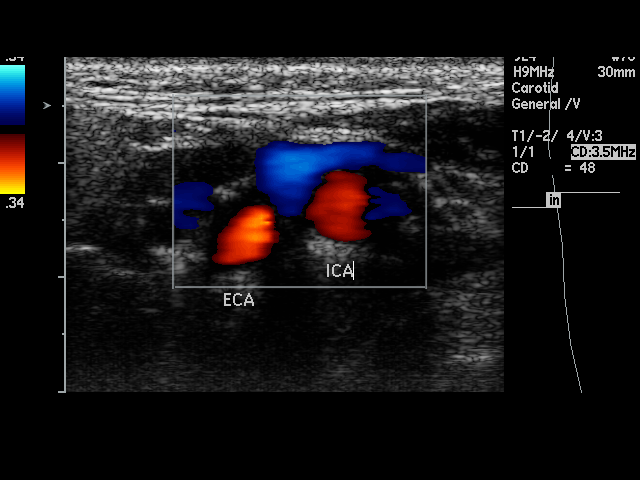
[im 43/62]
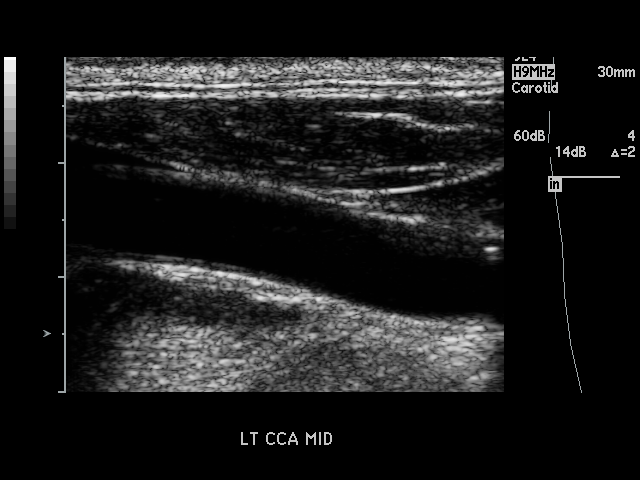
[im 46/62]
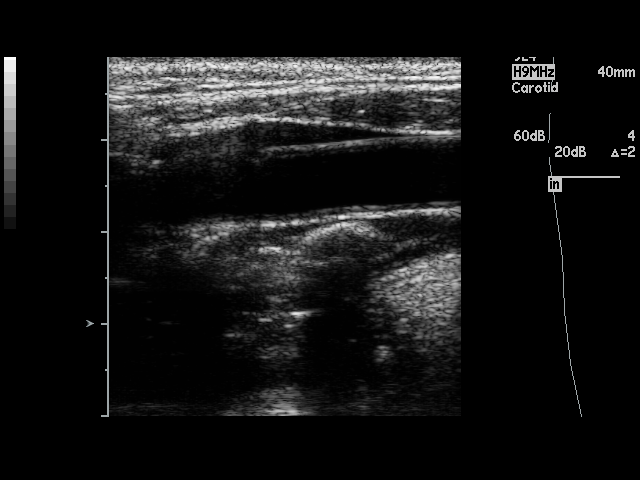
[im 51/62]
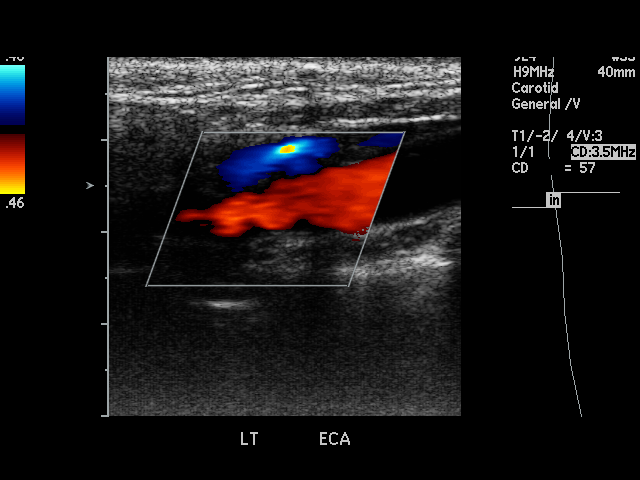
[im 54/62]
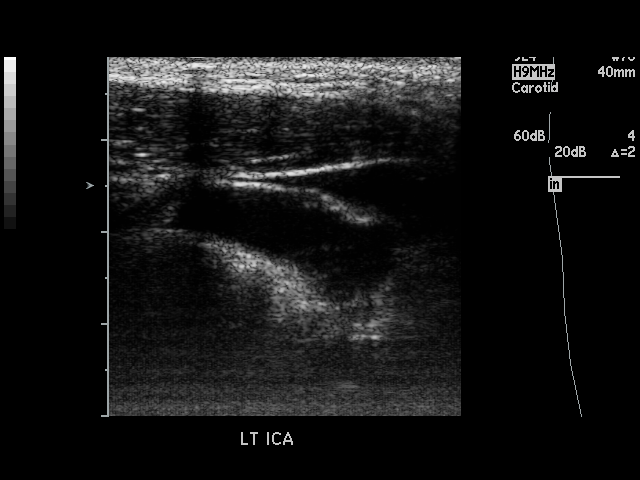
[im 56/62]
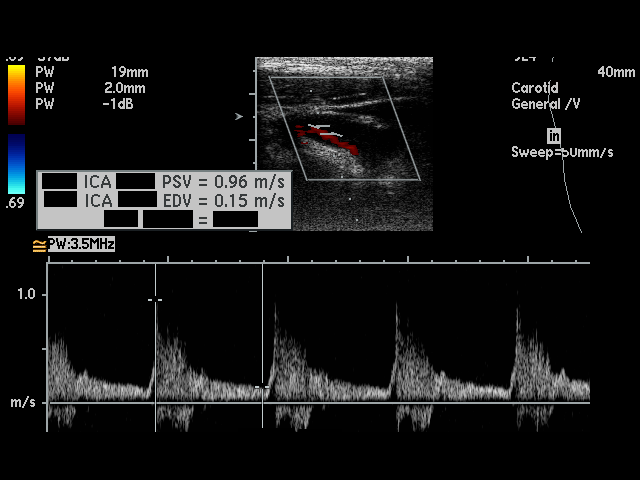
[im 62/62]
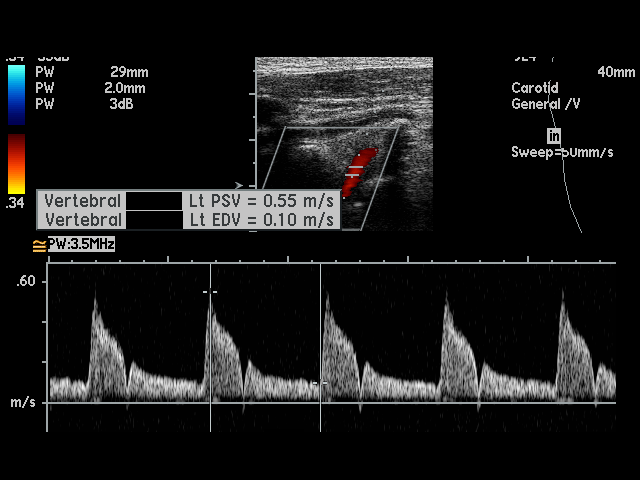

[17 of 24 positions shown; findings below may reference images not displayed]

PROCEDURE:     US  - US CAROTID DOPPLER BILATERAL  - [DATE] [DATE]

RESULT:     Duplex Doppler interrogation of the carotids is performed in the
standard fashion. The study demonstrates the presence of atherosclerotic
plaque bilaterally with visually no definite stenosis evident. Antegrade
flow is seen in both vertebral arteries. The spectral Doppler and color
Doppler images appear to be within normal limits. The peak systolic
velocities are unremarkable. The internal to common carotid peak systolic
velocity ratio on the RIGHT is 0.84. The ratio on the LEFT is 0.86.
IMPRESSION: Atherosclerotic disease without evidence of hemodynamically significant
stenosis.

## 2013-10-10 DIAGNOSIS — H18519 Endothelial corneal dystrophy, unspecified eye: Secondary | ICD-10-CM | POA: Insufficient documentation

## 2013-10-10 DIAGNOSIS — H2513 Age-related nuclear cataract, bilateral: Secondary | ICD-10-CM | POA: Insufficient documentation

## 2013-11-19 DIAGNOSIS — I10 Essential (primary) hypertension: Secondary | ICD-10-CM | POA: Insufficient documentation

## 2016-06-20 DIAGNOSIS — C44119 Basal cell carcinoma of skin of left eyelid, including canthus: Secondary | ICD-10-CM | POA: Diagnosis not present

## 2016-06-20 DIAGNOSIS — C44311 Basal cell carcinoma of skin of nose: Secondary | ICD-10-CM | POA: Diagnosis not present

## 2016-07-04 DIAGNOSIS — C44311 Basal cell carcinoma of skin of nose: Secondary | ICD-10-CM | POA: Diagnosis not present

## 2016-07-04 DIAGNOSIS — Z85828 Personal history of other malignant neoplasm of skin: Secondary | ICD-10-CM | POA: Diagnosis not present

## 2016-08-22 DIAGNOSIS — C44311 Basal cell carcinoma of skin of nose: Secondary | ICD-10-CM | POA: Diagnosis not present

## 2016-09-06 DIAGNOSIS — C44311 Basal cell carcinoma of skin of nose: Secondary | ICD-10-CM | POA: Diagnosis not present

## 2017-08-08 DIAGNOSIS — E7849 Other hyperlipidemia: Secondary | ICD-10-CM | POA: Diagnosis not present

## 2017-08-08 DIAGNOSIS — I10 Essential (primary) hypertension: Secondary | ICD-10-CM | POA: Diagnosis not present

## 2017-08-08 DIAGNOSIS — R5381 Other malaise: Secondary | ICD-10-CM | POA: Diagnosis not present

## 2017-08-08 DIAGNOSIS — Z125 Encounter for screening for malignant neoplasm of prostate: Secondary | ICD-10-CM | POA: Diagnosis not present

## 2017-08-09 DIAGNOSIS — I10 Essential (primary) hypertension: Secondary | ICD-10-CM | POA: Diagnosis not present

## 2017-08-09 DIAGNOSIS — R12 Heartburn: Secondary | ICD-10-CM | POA: Diagnosis not present

## 2017-08-09 DIAGNOSIS — D509 Iron deficiency anemia, unspecified: Secondary | ICD-10-CM | POA: Diagnosis not present

## 2017-08-09 DIAGNOSIS — H53002 Unspecified amblyopia, left eye: Secondary | ICD-10-CM | POA: Diagnosis not present

## 2017-12-12 ENCOUNTER — Inpatient Hospital Stay
Admission: EM | Admit: 2017-12-12 | Discharge: 2017-12-14 | DRG: 684 | Disposition: A | Discharge status: Home / Self Care | Payer: Medicare HMO | Attending: Family Medicine | Admitting: Family Medicine

## 2017-12-12 ENCOUNTER — Encounter: Payer: Self-pay | Admitting: Emergency Medicine

## 2017-12-12 ENCOUNTER — Inpatient Hospital Stay: Payer: Medicare HMO

## 2017-12-12 ENCOUNTER — Other Ambulatory Visit: Payer: Self-pay

## 2017-12-12 DIAGNOSIS — B9689 Other specified bacterial agents as the cause of diseases classified elsewhere: Secondary | ICD-10-CM | POA: Diagnosis present

## 2017-12-12 DIAGNOSIS — N136 Pyonephrosis: Secondary | ICD-10-CM | POA: Diagnosis present

## 2017-12-12 DIAGNOSIS — K5900 Constipation, unspecified: Secondary | ICD-10-CM | POA: Diagnosis present

## 2017-12-12 DIAGNOSIS — I10 Essential (primary) hypertension: Secondary | ICD-10-CM | POA: Diagnosis not present

## 2017-12-12 DIAGNOSIS — E860 Dehydration: Secondary | ICD-10-CM | POA: Diagnosis not present

## 2017-12-12 DIAGNOSIS — Z7982 Long term (current) use of aspirin: Secondary | ICD-10-CM

## 2017-12-12 DIAGNOSIS — N1330 Unspecified hydronephrosis: Secondary | ICD-10-CM | POA: Diagnosis not present

## 2017-12-12 DIAGNOSIS — Z79899 Other long term (current) drug therapy: Secondary | ICD-10-CM

## 2017-12-12 DIAGNOSIS — R531 Weakness: Secondary | ICD-10-CM | POA: Diagnosis not present

## 2017-12-12 DIAGNOSIS — N329 Bladder disorder, unspecified: Secondary | ICD-10-CM | POA: Diagnosis present

## 2017-12-12 DIAGNOSIS — N179 Acute kidney failure, unspecified: Principal | ICD-10-CM | POA: Diagnosis present

## 2017-12-12 DIAGNOSIS — N390 Urinary tract infection, site not specified: Secondary | ICD-10-CM | POA: Diagnosis not present

## 2017-12-12 DIAGNOSIS — R310 Gross hematuria: Secondary | ICD-10-CM | POA: Diagnosis not present

## 2017-12-12 DIAGNOSIS — N1339 Other hydronephrosis: Secondary | ICD-10-CM | POA: Diagnosis not present

## 2017-12-12 DIAGNOSIS — F17220 Nicotine dependence, chewing tobacco, uncomplicated: Secondary | ICD-10-CM | POA: Diagnosis present

## 2017-12-12 DIAGNOSIS — R339 Retention of urine, unspecified: Secondary | ICD-10-CM | POA: Diagnosis not present

## 2017-12-12 DIAGNOSIS — Z720 Tobacco use: Secondary | ICD-10-CM | POA: Diagnosis not present

## 2017-12-12 DIAGNOSIS — N3289 Other specified disorders of bladder: Secondary | ICD-10-CM | POA: Diagnosis not present

## 2017-12-12 HISTORY — DX: Essential (primary) hypertension: I10

## 2017-12-12 LAB — URINALYSIS, COMPLETE (UACMP) WITH MICROSCOPIC
Bacteria, UA: NONE SEEN
Bilirubin Urine: NEGATIVE
GLUCOSE, UA: NEGATIVE mg/dL
Ketones, ur: NEGATIVE mg/dL
Nitrite: NEGATIVE
PROTEIN: 100 mg/dL — AB
RBC / HPF: >50 RBC/hpf — ABNORMAL HIGH (ref 0–5)
SPECIFIC GRAVITY, URINE: 1.014 (ref 1.005–1.030)
pH: 5 (ref 5.0–8.0)

## 2017-12-12 LAB — CBC
HEMATOCRIT: 30.7 % — AB (ref 40.0–52.0)
HEMOGLOBIN: 10.9 g/dL — AB (ref 13.0–18.0)
MCH: 34.1 pg — ABNORMAL HIGH (ref 26.0–34.0)
MCHC: 35.5 g/dL (ref 32.0–36.0)
MCV: 95.9 fL (ref 80.0–100.0)
Platelets: 250 10*3/uL (ref 150–440)
RBC: 3.2 MIL/uL — AB (ref 4.40–5.90)
RDW: 12.8 % (ref 11.5–14.5)
WBC: 6.9 10*3/uL (ref 3.8–10.6)

## 2017-12-12 LAB — BASIC METABOLIC PANEL
Anion gap: 4 — ABNORMAL LOW (ref 5–15)
BUN: 38 mg/dL — AB (ref 8–23)
CO2: 24 mmol/L (ref 22–32)
CREATININE: 2.4 mg/dL — AB (ref 0.61–1.24)
Calcium: 9.1 mg/dL (ref 8.9–10.3)
Chloride: 111 mmol/L (ref 98–111)
GFR calc non Af Amer: 23 mL/min — ABNORMAL LOW (ref >60)
GFR, EST AFRICAN AMERICAN: 27 mL/min — AB (ref >60)
Glucose, Bld: 96 mg/dL (ref 70–99)
Potassium: 4.8 mmol/L (ref 3.5–5.1)
Sodium: 139 mmol/L (ref 135–145)

## 2017-12-12 IMAGING — US US RENAL
1 series · 14 of 25 positions shown · non-contrast
Comparison: No prior renal imaging.

CLINICAL DATA: 85-year-old male with acute renal injury.

EXAM:
RENAL / URINARY TRACT ULTRASOUND COMPLETE

[Series 1: us renal · 14 of 46 slices shown]
[im 1/46]
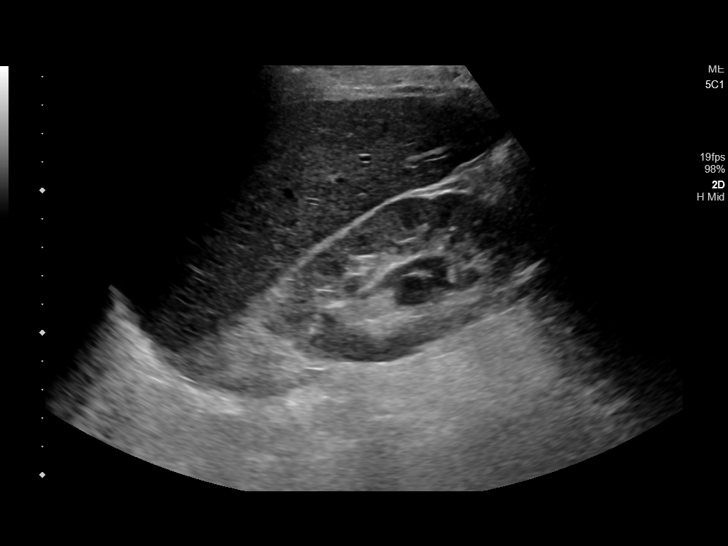
[im 4/46]
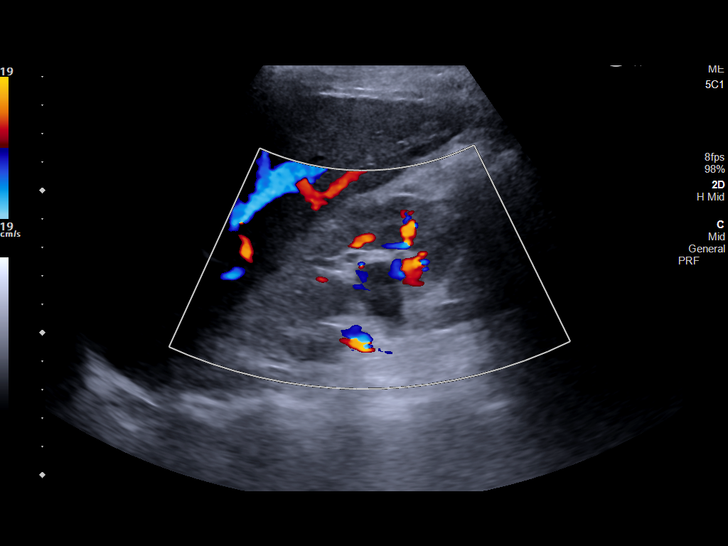
[im 8/46]
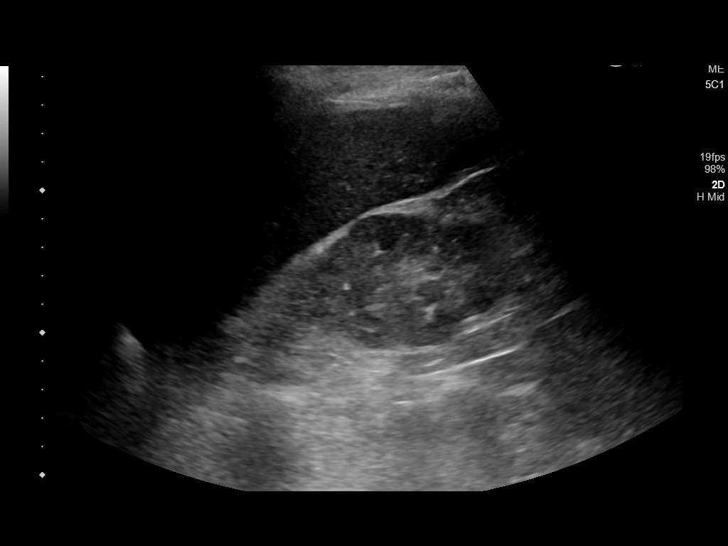
[im 12/46]
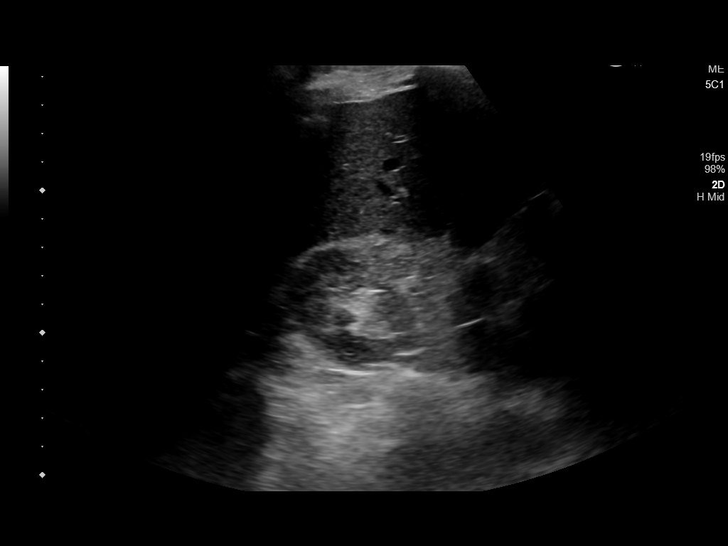
[im 16/46]
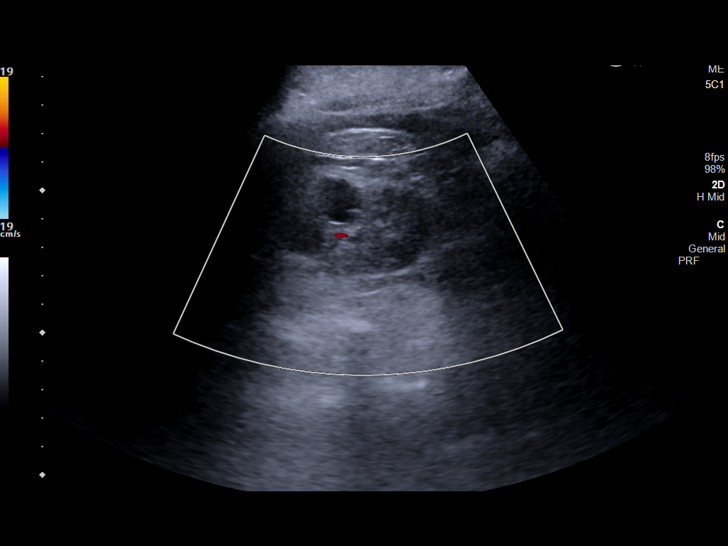
[im 17/46]
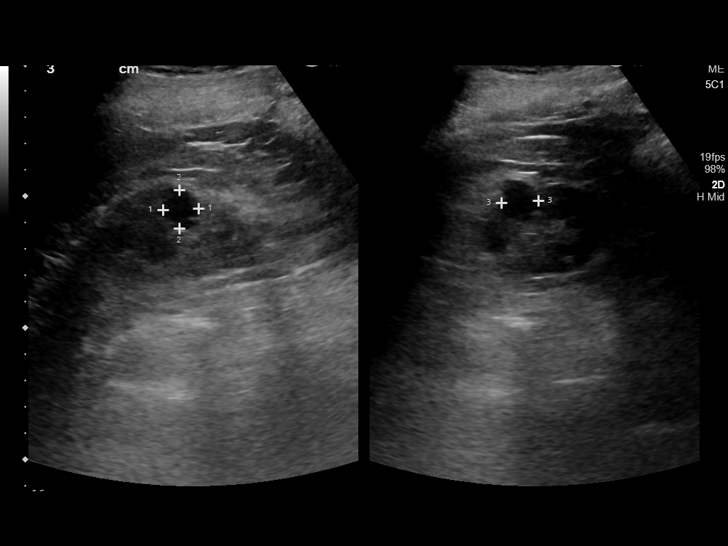
[im 21/46]
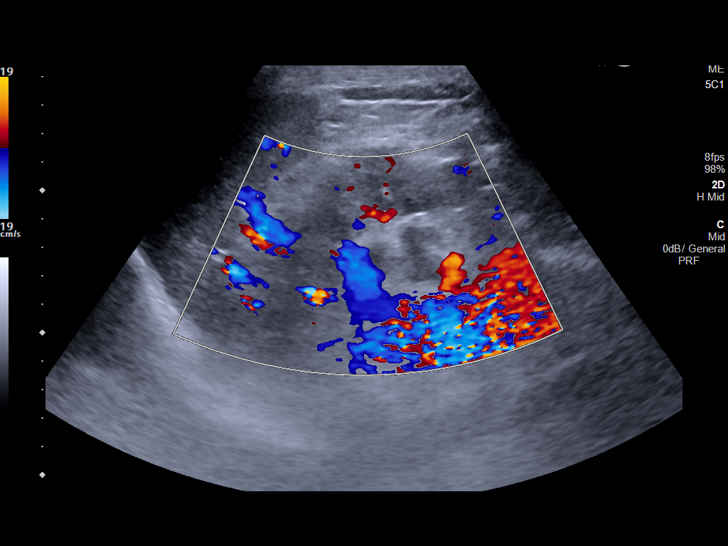
[im 25/46]
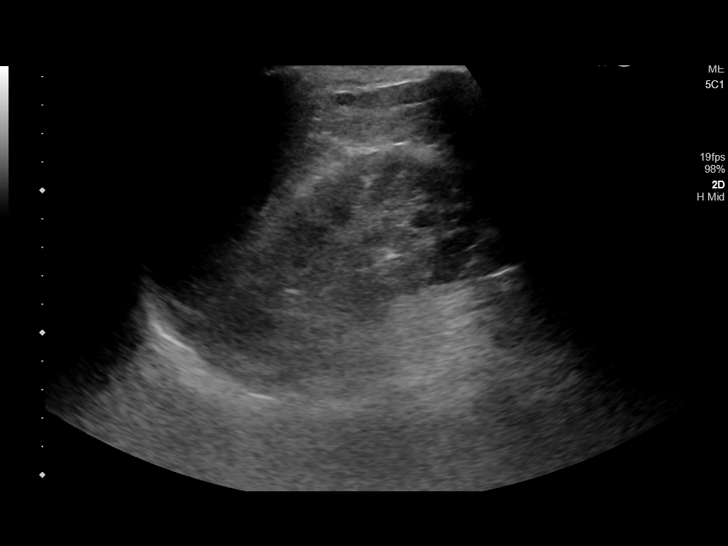
[im 29/46]
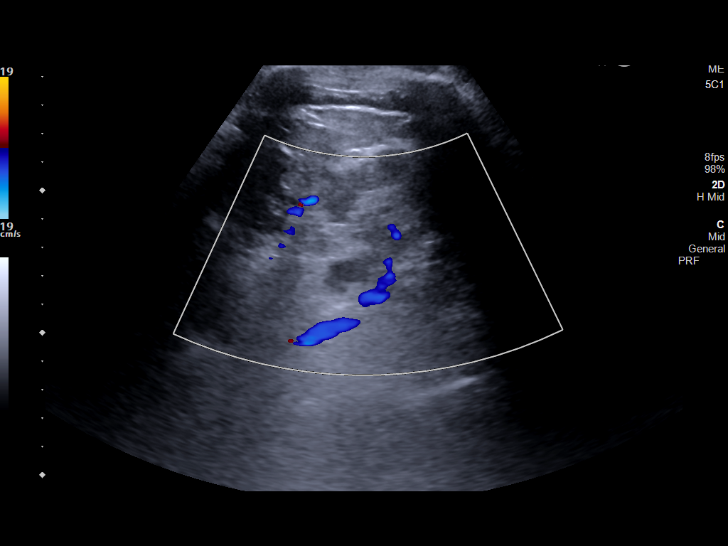
[im 31/46]
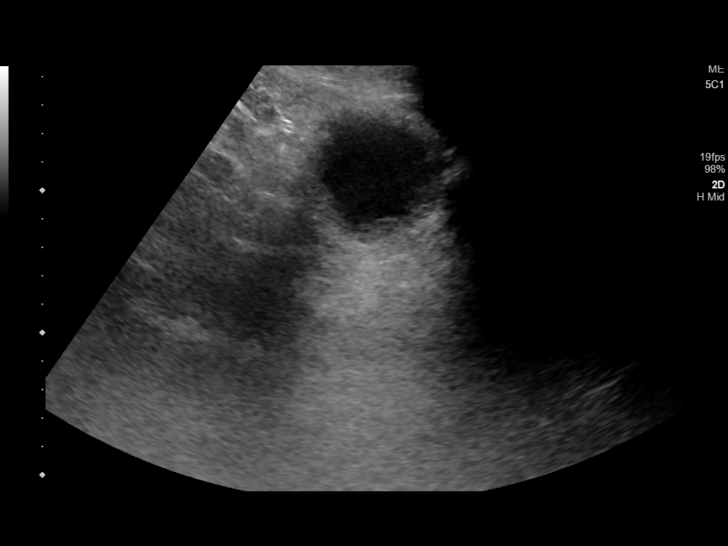
[im 34/46]
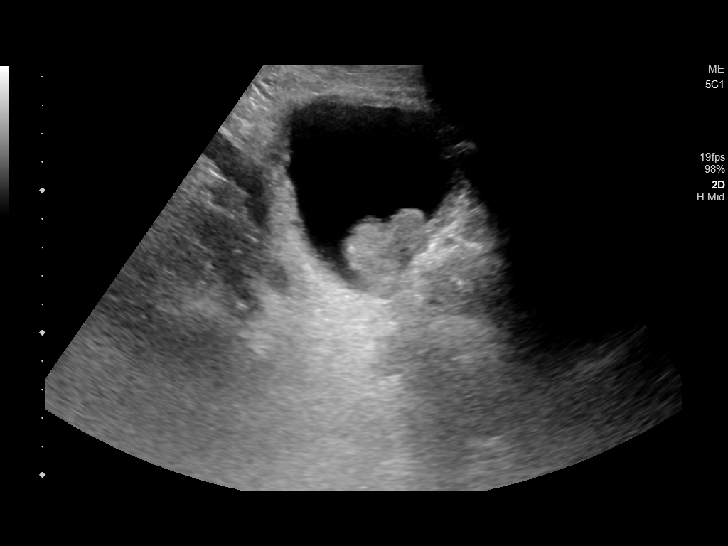
[im 38/46]
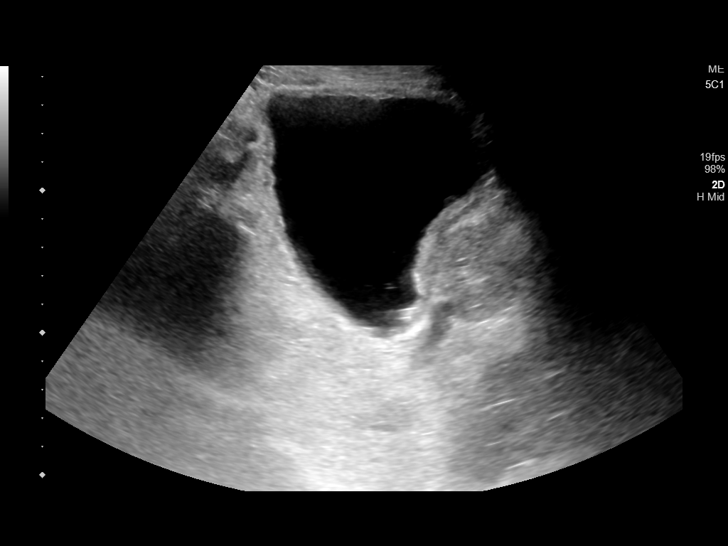
[im 42/46]
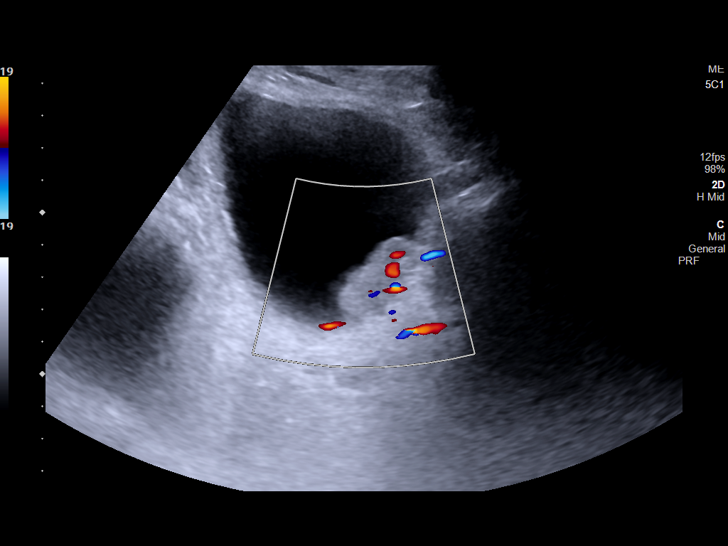
[im 46/46]
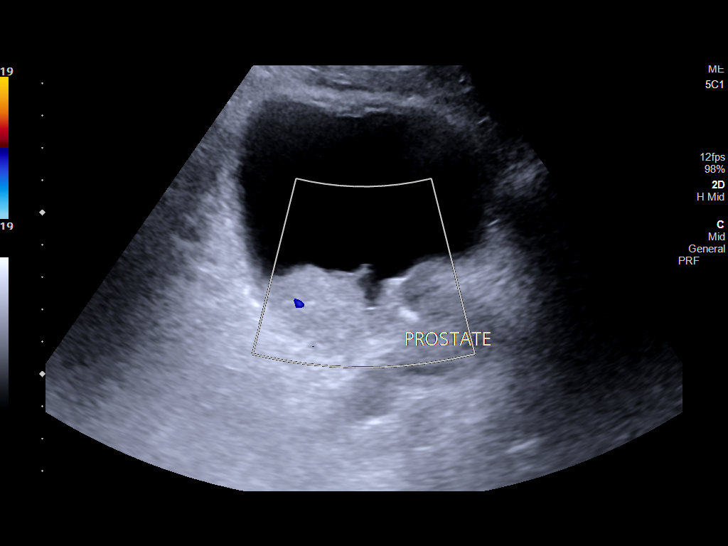

[14 of 25 positions shown; findings below may reference images not displayed]

FINDINGS: Right Kidney:

Length: 8.8 centimeters. Cortical echogenicity within normal limits.
Mild right hydronephrosis (image 6). The proximal right ureter is
mildly dilated on image 7. A small simple 15 millimeter lower pole
cyst is noted.

Left Kidney:

Length: 7.8 centimeters. The left kidney is not as well visualized
as the right. There is evidence of mild left hydronephrosis (images
22 and 23) similar to the right side.

Bladder:

There is a solid, vascular lobulated echogenic mass at the base of
the bladder (image 43), measuring about 3.8 centimeters. See image
46.
IMPRESSION: 1. Lobulated, vascular soft tissue mass at the base of the bladder,
estimated at 3.8 cm.
2. Mild bilateral hydronephrosis, likely bilateral UVJ obstruction
from #1.
3. Recommend Urology consultation.

## 2017-12-12 MED ORDER — ENOXAPARIN SODIUM 30 MG/0.3ML ~~LOC~~ SOLN
30.0000 mg | SUBCUTANEOUS | Status: DC
  Administered 2017-12-13: 30 mg via SUBCUTANEOUS
  Filled 2017-12-12 (×2): qty 0.3

## 2017-12-12 MED ORDER — FERROUS SULFATE 325 (65 FE) MG PO TABS
325.0000 mg | ORAL_TABLET | Freq: Every day | ORAL | Status: DC
  Administered 2017-12-13 – 2017-12-14 (×2): 325 mg via ORAL
  Filled 2017-12-12 (×2): qty 1

## 2017-12-12 MED ORDER — AMLODIPINE BESYLATE 10 MG PO TABS
10.0000 mg | ORAL_TABLET | Freq: Every day | ORAL | Status: DC
  Administered 2017-12-13 – 2017-12-14 (×2): 10 mg via ORAL
  Filled 2017-12-12 (×2): qty 1

## 2017-12-12 MED ORDER — ASPIRIN EC 81 MG PO TBEC
81.0000 mg | DELAYED_RELEASE_TABLET | Freq: Every day | ORAL | Status: DC
  Administered 2017-12-13 – 2017-12-14 (×2): 81 mg via ORAL
  Filled 2017-12-12 (×2): qty 1

## 2017-12-12 MED ORDER — ACETAMINOPHEN 650 MG RE SUPP: 650.0000 mg | Freq: Four times a day (QID) | RECTAL | Status: DC | PRN

## 2017-12-12 MED ORDER — SODIUM CHLORIDE 0.9 % IV SOLN
1000.0000 mL | Freq: Once | INTRAVENOUS | Status: AC
  Administered 2017-12-12: 1000 mL via INTRAVENOUS

## 2017-12-12 MED ORDER — ADULT MULTIVITAMIN W/MINERALS CH
1.0000 | ORAL_TABLET | Freq: Every day | ORAL | Status: DC
  Administered 2017-12-13 – 2017-12-14 (×2): 1 via ORAL
  Filled 2017-12-12 (×2): qty 1

## 2017-12-12 MED ORDER — CEFTRIAXONE SODIUM 1 G IJ SOLR
1.0000 g | Freq: Once | INTRAMUSCULAR | Status: AC
  Administered 2017-12-12: 1 g via INTRAVENOUS
  Filled 2017-12-12: qty 10

## 2017-12-12 MED ORDER — SODIUM CHLORIDE 0.9 % IV SOLN
INTRAVENOUS | Status: AC
  Administered 2017-12-12 – 2017-12-13 (×3): via INTRAVENOUS

## 2017-12-12 MED ORDER — ACETAMINOPHEN 325 MG PO TABS: 650.0000 mg | ORAL_TABLET | Freq: Four times a day (QID) | ORAL | Status: DC | PRN

## 2017-12-12 MED ORDER — ONDANSETRON HCL 4 MG/2ML IJ SOLN: 4.0000 mg | Freq: Four times a day (QID) | INTRAMUSCULAR | Status: DC | PRN

## 2017-12-12 MED ORDER — CEFTRIAXONE SODIUM 1 G IJ SOLR
1.0000 g | INTRAMUSCULAR | Status: DC
  Administered 2017-12-13: 1 g via INTRAVENOUS
  Filled 2017-12-12: qty 1
  Filled 2017-12-12: qty 10

## 2017-12-12 MED ORDER — ONDANSETRON HCL 4 MG PO TABS: 4.0000 mg | ORAL_TABLET | Freq: Four times a day (QID) | ORAL | Status: DC | PRN

## 2017-12-12 NOTE — ED Triage Notes (Signed)
Pt comes into the ED via POV qwith his family c/o weakness x 3-4 days.  Patient states he feels like "hes giving out".  Patient has even and unlabored respirations and states at times he has dizziness but denies any chest pain or shortness of breath.  Patient in NAD at this time and is neurologically intact.

## 2017-12-12 NOTE — ED Provider Notes (Signed)
[facility] Emergency Department Provider Note   ____________________________________________    I have reviewed the triage vital signs and the nursing notes.   HISTORY  Chief Complaint Weakness     HPI Edward Cummings is a 84 y.o. male who presents with complaints of weakness.  Patient reports that over the last week he has had worsening diffuse weakness.  He denies shortness of breath or cough.  No fevers or chills.  Does report some dysuria and lower abdominal discomfort occasionally.  Has never had this before.  Reports normal stools.  No weight loss.  No calf pain or swelling.  No recent travel.  Past Medical History:  Diagnosis Date  . Hypertension     Patient Active Problem List   Diagnosis Date Noted  . AKI (acute kidney injury) ([place]) 12/12/2017    History reviewed. No pertinent surgical history.  Prior to Admission medications   Medication Sig Start Date End Date Taking? Authorizing Provider  amLODipine (NORVASC) 10 MG tablet Take 1 tablet by mouth daily. 08/25/13  Yes [provider]  aspirin EC 81 MG tablet Take 1 tablet by mouth daily.   Yes [provider]  ferrous sulfate 325 (65 FE) MG tablet Take 325 mg by mouth daily with breakfast.   Yes [provider]  meloxicam (MOBIC) 7.5 MG tablet Take 1 tablet by mouth daily. 11/08/17  Yes [provider]  Multiple Vitamin (MULTIVITAMIN) capsule Take 1 capsule by mouth daily.   Yes [provider]     Allergies Patient has no known allergies.  No family history on file.  Social History Social History   Tobacco Use  . Smoking status: Former Research scientist (life sciences)  . Smokeless tobacco: Current User    Types: Chew  Substance Use Topics  . Alcohol use: Yes  . Drug use: Never    Review of Systems  Constitutional: No fever/chills Eyes: No visual changes.  ENT: No sore throat. Cardiovascular: Denies chest pain. Respiratory: Denies shortness of  breath. Gastrointestinal: No nausea, no vomiting.   Genitourinary: As above Musculoskeletal: Negative for back pain. Skin: Negative for rash. Neurological: Negative for headaches   ____________________________________________   PHYSICAL EXAM:  VITAL SIGNS: ED Triage Vitals  Enc Vitals Group     BP 12/12/17 1202 (!) 121/51     Pulse Rate 12/12/17 1202 66     Resp 12/12/17 1202 18     Temp 12/12/17 1202 97.8 F (36.6 C)     Temp Source 12/12/17 1202 Oral     SpO2 12/12/17 1202 100 %     Weight 12/12/17 1212 63.5 kg (140 lb)     Height 12/12/17 1212 1.727 m (5\' 8" )     Head Circumference --      Peak Flow --      Pain Score 12/12/17 1212 0     Pain Loc --      Pain Edu? --      Excl. in [place]? --     Constitutional: Alert and oriented.Pleasant and interactive Eyes: Conjunctivae are normal.   Nose: No congestion/rhinnorhea. Mouth/Throat: Mucous membranes are moist.   Neck:  Painless ROM Cardiovascular: Normal rate, regular rhythm. Grossly normal heart sounds.  Good peripheral circulation. Respiratory: Normal respiratory effort.  No retractions. Lungs CTAB. Gastrointestinal: Soft and nontender. No distention.  No CVA tenderness. Genitourinary: deferred Musculoskeletal: No lower extremity tenderness nor edema.  Warm and well perfused Neurologic:  Normal speech and language. No gross focal neurologic  deficits are appreciated.  Skin:  Skin is warm, dry and intact. No rash noted. Psychiatric: Mood and affect are normal. Speech and behavior are normal.  ____________________________________________   LABS (all labs ordered are listed, but only abnormal results are displayed)  Labs Reviewed  BASIC METABOLIC PANEL - Abnormal; Notable for the following components:      Result Value   BUN 38 (*)    Creatinine, Ser 2.40 (*)    GFR calc non Af Amer 23 (*)    GFR calc Af Amer 27 (*)    Anion gap 4 (*)    All other components within normal limits  CBC - Abnormal; Notable for  the following components:   RBC 3.20 (*)    Hemoglobin 10.9 (*)    HCT 30.7 (*)    MCH 34.1 (*)    All other components within normal limits  URINALYSIS, COMPLETE (UACMP) WITH MICROSCOPIC - Abnormal; Notable for the following components:   Color, Urine AMBER (*)    APPearance CLOUDY (*)    Hgb urine dipstick LARGE (*)    Protein, ur 100 (*)    Leukocytes, UA MODERATE (*)    RBC / HPF >50 (*)    WBC, UA >50 (*)    Non Squamous Epithelial 0-5 (*)    All other components within normal limits  URINE CULTURE   ____________________________________________  EKG  ED ECG REPORT I, Lavonia Drafts, the attending physician, personally viewed and interpreted this ECG.  Date: 12/12/2017  Rhythm: normal sinus rhythm QRS Axis: normal Intervals: normal ST/T Wave abnormalities: normal Narrative Interpretation: no evidence of acute ischemia  ____________________________________________  RADIOLOGY  None ____________________________________________   PROCEDURES  Procedure(s) performed: No  Procedures   Critical Care performed: No ____________________________________________   INITIAL IMPRESSION / ASSESSMENT AND PLAN / ED COURSE  Pertinent labs & imaging results that were available during my care of the patient were reviewed by me and considered in my medical decision making (see chart for details).  Patient with worsening weakness over the last week, his creatinine and BUN are significantly elevated consistent with likely acute kidney injury, no old records available, attempted to call PCP but apparently they are closed for the holidays.  Also with evidence of urinary tract infection, will give IV Rocephin, IV fluids, admit to the hospitalist for treatment of AKI    ____________________________________________   FINAL CLINICAL IMPRESSION(S) / ED DIAGNOSES  Final diagnoses:  Dehydration  AKI (acute kidney injury) ([place])  Lower urinary tract infectious disease         Note:  This document was prepared using Dragon voice recognition software and may include unintentional dictation errors.    Lavonia Drafts, MD 12/12/17 1357

## 2017-12-12 NOTE — H&P (Signed)
Edward Cummings: Edward Cummings    MR#:  [id number]  DATE OF BIRTH:  [birth year]  DATE OF ADMISSION:  12/12/2017  PRIMARY CARE PHYSICIAN: [name], MD   REQUESTING/REFERRING PHYSICIAN:   CHIEF COMPLAINT:   Generalized weakness HISTORY OF PRESENT ILLNESS:  Edward Cummings  is a 84 y.o. male with a known history of essential hypertension is presenting to the ED with a chief complaint of worsening of weakness for the past 1 week.  Denies any nausea vomiting or abdominal pain.  Denies any back pain or fever.  Urinalysis is abnormal.  Creatinine 2.40.  Patient denies any past medical history of renal insufficiency.  Hospitalist team is called to admit the patient urinalysis is abnormal with WBC clumps  PAST MEDICAL HISTORY:   Past Medical History:  Diagnosis Date  . Hypertension     PAST SURGICAL HISTOIRY:  History reviewed. No pertinent surgical history.  SOCIAL HISTORY:   Social History   Tobacco Use  . Smoking status: Former Research scientist (life sciences)  . Smokeless tobacco: Current User    Types: Chew  Substance Use Topics  . Alcohol use: Yes    FAMILY HISTORY:  Hypertension runs in his family  DRUG ALLERGIES:  No Known Allergies  REVIEW OF SYSTEMS:  CONSTITUTIONAL: No fever, fatigue or weakness.  EYES: No blurred or double vision.  EARS, NOSE, AND THROAT: No tinnitus or ear pain.  RESPIRATORY: No cough, shortness of breath, wheezing or hemoptysis.  CARDIOVASCULAR: No chest pain, orthopnea, edema.  GASTROINTESTINAL: No nausea, vomiting, diarrhea or abdominal pain.  GENITOURINARY: No dysuria, hematuria.  ENDOCRINE: No polyuria, nocturia,  HEMATOLOGY: No anemia, easy bruising or bleeding SKIN: No rash or lesion. MUSCULOSKELETAL: No joint pain or arthritis.   NEUROLOGIC: No tingling, numbness, weakness.  PSYCHIATRY: No anxiety or depression.   MEDICATIONS AT HOME:   Prior to Admission medications   Medication Sig Start  Date End Date Taking? Authorizing Provider  amLODipine (NORVASC) 10 MG tablet Take 1 tablet by mouth daily. 08/25/13  Yes [provider]  aspirin EC 81 MG tablet Take 1 tablet by mouth daily.   Yes [provider]  ferrous sulfate 325 (65 FE) MG tablet Take 325 mg by mouth daily with breakfast.   Yes [provider]  meloxicam (MOBIC) 7.5 MG tablet Take 1 tablet by mouth daily. 11/08/17  Yes [provider]  Multiple Vitamin (MULTIVITAMIN) capsule Take 1 capsule by mouth daily.   Yes [provider]      VITAL SIGNS:  Blood pressure (!) 127/93, pulse 67, temperature 97.8 F (36.6 C), temperature source Oral, resp. rate 16, height 5\' 8"  (1.727 m), weight 63.5 kg (140 lb), SpO2 99 %.  PHYSICAL EXAMINATION:  GENERAL:  84 y.o.-year-old patient lying in the bed with no acute distress.  EYES: Pupils equal, round, reactive to light and accommodation. No scleral icterus. Extraocular muscles intact.  HEENT: Head atraumatic, normocephalic. Oropharynx and nasopharynx clear.  NECK:  Supple, no jugular venous distention. No thyroid enlargement, no tenderness.  LUNGS: Normal breath sounds bilaterally, no wheezing, rales,rhonchi or crepitation. No use of accessory muscles of respiration.  CARDIOVASCULAR: S1, S2 normal. No murmurs, rubs, or gallops.  ABDOMEN: Soft, nontender, nondistended. Bowel sounds present. No organomegaly or mass.  EXTREMITIES: No pedal edema, cyanosis, or clubbing.  NEUROLOGIC: Cranial nerves II through XII are intact. Muscle strength 5/5 in all extremities. Sensation intact. Gait not checked.  PSYCHIATRIC: The patient is alert  and oriented x 3.  SKIN: No obvious rash, lesion, or ulcer.   LABORATORY PANEL:   CBC Recent Labs  Lab 12/12/17 1214  WBC 6.9  HGB 10.9*  HCT 30.7*  PLT 250   ------------------------------------------------------------------------------------------------------------------  Chemistries  Recent Labs   Lab 12/12/17 1214  NA 139  K 4.8  CL 111  CO2 24  GLUCOSE 96  BUN 38*  CREATININE 2.40*  CALCIUM 9.1   ------------------------------------------------------------------------------------------------------------------  Cardiac Enzymes No results for input(s): TROPONINI in the last 168 hours. ------------------------------------------------------------------------------------------------------------------  RADIOLOGY:  No results found.  EKG:   Orders placed or performed during the hospital encounter of 12/12/17  . ED EKG  . ED EKG    IMPRESSION AND PLAN:   Edward Cummings  is a 84 y.o. male with a known history of essential hypertension is presenting to the ED with a chief complaint of worsening of weakness for the past 1 week.  Denies any nausea vomiting or abdominal pain.  Denies any back pain or fever.  Urinalysis is abnormal.  Creatinine 2.40   #Acute kidney injury Admit to MedSurg unit  hydrate with IV fluids and monitor urine output Avoid nephrotoxins and I discontinued home medication meloxicam  renal ultrasound  check a.m. labs if no improvement in nephrology consult  #Acute cystitis IV fluids, IV Rocephin, follow-up on the urine culture and sensitivity  #Urinary retention check PSA, Foley catheter, monitor urine output  #Essential hypertension continue home medication amlodipine and titrate as needed  DVT prophylaxis with Lovenox subcu renal dose adjusted     All the records are reviewed and case discussed with ED provider. Management plans discussed with the patient, family and they are in agreement.  CODE STATUS: fc   TOTAL TIME TAKING CARE OF THIS PATIENT: 42 minutes.   Note: This dictation was prepared with Dragon dictation along with smaller phrase technology. Any transcriptional errors that result from this process are unintentional.  Nicholes Mango M.D on 12/12/2017 at 2:31 PM  Between 7am to 6pm - Pager - [phone]  After 6pm go to  [email or web address] - password [email or web address] [facility]  Office  [phone]  CC: Primary care physician; [name], MD

## 2017-12-12 NOTE — Progress Notes (Signed)
Family Meeting Note  Advance Directive:yes  Today a meeting took place with the Patient, son at bed side     The following clinical team members were present during this meeting:MD  The following were discussed:Patient's diagnosis: Acute kidney injury, acute cystitis, urinary retention, essential hypertension treatment plan of care discussed in detail with the patient.  He verbalized understanding of the plan.,,  Son patient's progosis: Unable to determine and Goals for treatment: Full Code Ronalee Belts is a healthcare power of attorney   Additional follow-up to be provided: Hospitalist  Time spent during discussion:17 min  Nicholes Mango, MD

## 2017-12-12 NOTE — Progress Notes (Signed)
Patient refusing foley at this time; spoke with Dr. Margaretmary Eddy - she does not want to cancel order but acknowledges refusal.

## 2017-12-13 DIAGNOSIS — N1330 Unspecified hydronephrosis: Secondary | ICD-10-CM

## 2017-12-13 DIAGNOSIS — N179 Acute kidney failure, unspecified: Secondary | ICD-10-CM

## 2017-12-13 DIAGNOSIS — R310 Gross hematuria: Secondary | ICD-10-CM

## 2017-12-13 DIAGNOSIS — N3289 Other specified disorders of bladder: Secondary | ICD-10-CM

## 2017-12-13 LAB — PSA: Prostatic Specific Antigen: 0.6 ng/mL (ref 0.00–4.00)

## 2017-12-13 LAB — CBC
HCT: 28.9 % — ABNORMAL LOW (ref 40.0–52.0)
Hemoglobin: 10.2 g/dL — ABNORMAL LOW (ref 13.0–18.0)
MCH: 33.5 pg (ref 26.0–34.0)
MCHC: 35.1 g/dL (ref 32.0–36.0)
MCV: 95.4 fL (ref 80.0–100.0)
PLATELETS: 227 10*3/uL (ref 150–440)
RBC: 3.03 MIL/uL — ABNORMAL LOW (ref 4.40–5.90)
RDW: 13.2 % (ref 11.5–14.5)
WBC: 5.9 10*3/uL (ref 3.8–10.6)

## 2017-12-13 LAB — BASIC METABOLIC PANEL
Anion gap: 5 (ref 5–15)
BUN: 29 mg/dL — AB (ref 8–23)
CALCIUM: 8.5 mg/dL — AB (ref 8.9–10.3)
CHLORIDE: 115 mmol/L — AB (ref 98–111)
CO2: 22 mmol/L (ref 22–32)
CREATININE: 1.75 mg/dL — AB (ref 0.61–1.24)
GFR calc Af Amer: 40 mL/min — ABNORMAL LOW (ref >60)
GFR calc non Af Amer: 34 mL/min — ABNORMAL LOW (ref >60)
GLUCOSE: 80 mg/dL (ref 70–99)
Potassium: 4.7 mmol/L (ref 3.5–5.1)
Sodium: 142 mmol/L (ref 135–145)

## 2017-12-13 MED ORDER — DOCUSATE SODIUM 100 MG PO CAPS
200.0000 mg | ORAL_CAPSULE | Freq: Two times a day (BID) | ORAL | Status: DC
  Administered 2017-12-13 – 2017-12-14 (×3): 200 mg via ORAL
  Filled 2017-12-13 (×3): qty 2

## 2017-12-13 MED ORDER — SODIUM CHLORIDE 0.9 % IV SOLN
INTRAVENOUS | Status: DC
  Administered 2017-12-13 (×2): via INTRAVENOUS

## 2017-12-13 NOTE — Consult Note (Addendum)
H&P Physician requesting consult: Loney Hering, MD  Chief Complaint: Bladder mass  History of Present Illness: 84 year old male who presented to the emergency department with a weeklong history of weakness.  He was found to have acute renal failure with a creatinine of 2.4.  He also had gross hematuria.  He underwent a renal bladder ultrasound that revealed evidence of a 4 cm posterior bladder wall bladder mass.  There is also revealed mild bilateral hydronephrosis.  He refused catheter placement.  After fluid resuscitation, he feels much improved.  Weakness is much better.  Serum creatinine has also improved down to 1.75.  He states he is voiding well and denies any obstructive voiding complaints.  Urine has cleared up.  Patient admits to a greater than 50-year history of smoking.  He also notes intermittent gross hematuria for quite some time now.  This is only his second time being in the hospital in his life.  PSA was drawn and was normal at 0.6.  Past Medical History:  Diagnosis Date  . Hypertension    History reviewed. No pertinent surgical history.  Home Medications:  Medications Prior to Admission  Medication Sig Dispense Refill Last Dose  . amLODipine (NORVASC) 10 MG tablet Take 1 tablet by mouth daily.   12/12/2017 at Unknown time  . aspirin EC 81 MG tablet Take 1 tablet by mouth daily.   12/12/2017 at Unknown time  . ferrous sulfate 325 (65 FE) MG tablet Take 325 mg by mouth daily with breakfast.   12/12/2017 at Unknown time  . meloxicam (MOBIC) 7.5 MG tablet Take 1 tablet by mouth daily.   12/12/2017 at Unknown time  . Multiple Vitamin (MULTIVITAMIN) capsule Take 1 capsule by mouth daily.   12/12/2017 at Unknown time   Allergies: No Known Allergies  No family history on file. Social History:  reports that he has quit smoking. His smokeless tobacco use includes chew. He reports that he drinks alcohol. He reports that he does not use drugs.  ROS: A complete review of systems was  performed.  All systems are negative except for pertinent findings as noted. ROS   Physical Exam:  Vital signs in last 24 hours: Temp:  [97.5 F (36.4 C)-98.3 F (36.8 C)] 97.8 F (36.6 C) (07/04 0955) Pulse Rate:  [58-78] 63 (07/04 0955) Resp:  [16-20] 16 (07/04 0955) BP: (124-139)/(61-93) 135/65 (07/04 0955) SpO2:  [96 %-100 %] 100 % (07/04 0955)  General:  Alert and oriented, No acute distress HEENT: Normocephalic, atraumatic Neck: No JVD or lymphadenopathy Cardiovascular: Regular rate and rhythm Lungs: Regular rate and effort Abdomen: Soft, nontender, nondistended, no abdominal masses Back: No CVA tenderness Extremities: No edema Neurologic: Grossly intact  Laboratory Data:  Results for orders placed or performed during the hospital encounter of 12/12/17 (from the past 24 hour(s))  PSA     Status: None   Collection Time: 12/13/17  4:53 AM  Result Value Ref Range   Prostatic Specific Antigen 0.60 0.00 - 4.00 ng/mL  Basic metabolic panel     Status: Abnormal   Collection Time: 12/13/17  4:53 AM  Result Value Ref Range   Sodium 142 135 - 145 mmol/L   Potassium 4.7 3.5 - 5.1 mmol/L   Chloride 115 (H) 98 - 111 mmol/L   CO2 22 22 - 32 mmol/L   Glucose, Bld 80 70 - 99 mg/dL   BUN 29 (H) 8 - 23 mg/dL   Creatinine, Ser 1.75 (H) 0.61 - 1.24 mg/dL   Calcium  8.5 (L) 8.9 - 10.3 mg/dL   GFR calc non Af Amer 34 (L) >60 mL/min   GFR calc Af Amer 40 (L) >60 mL/min   Anion gap 5 5 - 15  CBC     Status: Abnormal   Collection Time: 12/13/17  4:53 AM  Result Value Ref Range   WBC 5.9 3.8 - 10.6 K/uL   RBC 3.03 (L) 4.40 - 5.90 MIL/uL   Hemoglobin 10.2 (L) 13.0 - 18.0 g/dL   HCT 28.9 (L) 40.0 - 52.0 %   MCV 95.4 80.0 - 100.0 fL   MCH 33.5 26.0 - 34.0 pg   MCHC 35.1 32.0 - 36.0 g/dL   RDW 13.2 11.5 - 14.5 %   Platelets 227 150 - 440 K/uL   No results found for this or any previous visit (from the past 240 hour(s)). Creatinine: Recent Labs    12/12/17 1214 12/13/17 0453   CREATININE 2.40* 1.75*   Renal ultrasound personally reviewed and is documented in the history of present illness  Impression/Assessment:  Bladder mass Gross hematuria secondary to the above, improving Bilateral hydronephrosis likely secondary to obstruction from bladder mass  Plan:  Creatinine has improved as has his hematuria with conservative management with IV fluids.  No urgent intervention necessary today.  I discussed the option of TURBT with bilateral retrograde pyelogram and possible ureteral stent placement.  We will get him scheduled with a outpatient follow-up with 1 of the [facility] urology providers.  He will eventually need upper tract imaging with a CT scan of the abdomen and pelvis.  This would be preferably performed with contrast if his kidney function is able to improve to that point but otherwise, can just use a noncontrast scan if renal function does not allow for IV contrast.  This can be done outpatient.  Marton Redwood, III 12/13/2017, 1:31 PM

## 2017-12-13 NOTE — Progress Notes (Signed)
[place] at [place] NAME: Edward Cummings    MR#:  [id number]  DATE OF BIRTH:  1932/10/22  SUBJECTIVE:  CHIEF COMPLAINT:   Chief Complaint  Patient presents with  . Weakness  Patient complains of constipation only, discussed case with the patient's son with all questions answered, discussed ultrasound abnormalities with urology/Dr. Bell-recommended outpatient follow-up next week for reevaluation  REVIEW OF SYSTEMS:  CONSTITUTIONAL: No fever, fatigue or weakness.  EYES: No blurred or double vision.  EARS, NOSE, AND THROAT: No tinnitus or ear pain.  RESPIRATORY: No cough, shortness of breath, wheezing or hemoptysis.  CARDIOVASCULAR: No chest pain, orthopnea, edema.  GASTROINTESTINAL: No nausea, vomiting, diarrhea or abdominal pain.  GENITOURINARY: No dysuria, hematuria.  ENDOCRINE: No polyuria, nocturia,  HEMATOLOGY: No anemia, easy bruising or bleeding SKIN: No rash or lesion. MUSCULOSKELETAL: No joint pain or arthritis.   NEUROLOGIC: No tingling, numbness, weakness.  PSYCHIATRY: No anxiety or depression.   ROS  DRUG ALLERGIES:  No Known Allergies  VITALS:  Blood pressure 135/65, pulse 63, temperature 97.8 F (36.6 C), temperature source Oral, resp. rate 16, height 5\' 8"  (1.727 m), weight 63.5 kg (140 lb), SpO2 100 %.  PHYSICAL EXAMINATION:  GENERAL:  84 y.o.-year-old patient lying in the bed with no acute distress.  EYES: Pupils equal, round, reactive to light and accommodation. No scleral icterus. Extraocular muscles intact.  HEENT: Head atraumatic, normocephalic. Oropharynx and nasopharynx clear.  NECK:  Supple, no jugular venous distention. No thyroid enlargement, no tenderness.  LUNGS: Normal breath sounds bilaterally, no wheezing, rales,rhonchi or crepitation. No use of accessory muscles of respiration.  CARDIOVASCULAR: S1, S2 normal. No murmurs, rubs, or gallops.  ABDOMEN: Soft, nontender, nondistended. Bowel sounds present. No  organomegaly or mass.  EXTREMITIES: No pedal edema, cyanosis, or clubbing.  NEUROLOGIC: Cranial nerves II through XII are intact. Muscle strength 5/5 in all extremities. Sensation intact. Gait not checked.  PSYCHIATRIC: The patient is alert and oriented x 3.  SKIN: No obvious rash, lesion, or ulcer.   Physical Exam LABORATORY PANEL:   CBC Recent Labs  Lab 12/13/17 0453  WBC 5.9  HGB 10.2*  HCT 28.9*  PLT 227   ------------------------------------------------------------------------------------------------------------------  Chemistries  Recent Labs  Lab 12/13/17 0453  NA 142  K 4.7  CL 115*  CO2 22  GLUCOSE 80  BUN 29*  CREATININE 1.75*  CALCIUM 8.5*   ------------------------------------------------------------------------------------------------------------------  Cardiac Enzymes No results for input(s): TROPONINI in the last 168 hours. ------------------------------------------------------------------------------------------------------------------  RADIOLOGY:  US Renal  Result Date: 12/12/2017 CLINICAL DATA:  84 year old male with acute renal injury. EXAM: RENAL / URINARY TRACT ULTRASOUND COMPLETE COMPARISON:  No prior renal imaging. FINDINGS: Right Kidney: Length: 8.8 centimeters. Cortical echogenicity within normal limits. Mild right hydronephrosis (image 6). The proximal right ureter is mildly dilated on image 7. A small simple 15 millimeter lower pole cyst is noted. Left Kidney: Length: 7.8 centimeters. The left kidney is not as well visualized as the right. There is evidence of mild left hydronephrosis (images 22 and 23) similar to the right side. Bladder: There is a solid, vascular lobulated echogenic mass at the base of the bladder (image 43), measuring about 3.8 centimeters. See image 46. IMPRESSION: 1. Lobulated, vascular soft tissue mass at the base of the bladder, estimated at 3.8 cm. 2. Mild bilateral hydronephrosis, likely bilateral UVJ obstruction from #1.  3. Recommend Urology consultation. Electronically Signed   By: Genevie Ann M.D.   On: 12/12/2017 15:31  ASSESSMENT AND PLAN:  Edward Cummings  is a 84 y.o. male with a known history of essential hypertension is presenting to the ED with a chief complaint of worsening of weakness for the past 1 week.  Denies any nausea vomiting or abdominal pain.  Denies any back pain or fever.  Urinalysis is abnormal.  Creatinine 2.40   *AKI Resolving  Continue IV fluids for rehydration, avoid nephrotoxic agents, ultrasound noted for UV junction obstruction/rated 3 cm bladder mass-Case discussed with Dr. Bell/urology who recommended outpatient follow-up next week for reevaluation   *Acute constipation  Colace twice daily   *Acute UTI  Continue empiric Rocephin and follow-up on cultures   *Acute urinary retention  Ultrasound noted above-plan of care per above   *hypertension Stable on current regiment   All the records are reviewed and case discussed with Care Management/Social Workerr. Management plans discussed with the patient, family and they are in agreement.  CODE STATUS: full  TOTAL TIME TAKING CARE OF THIS PATIENT: 35 minutes.     POSSIBLE D/C IN 1-3 DAYS, DEPENDING ON CLINICAL CONDITION.   Avel Peace Francyne Arreaga M.D on 12/13/2017   Between 7am to 6pm - Pager - [phone]  After 6pm go to [email or web address] - password [email or web address] [facility]  Office  [phone]  CC: Primary care physician; [name], MD  Note: This dictation was prepared with Dragon dictation along with smaller phrase technology. Any transcriptional errors that result from this process are unintentional.

## 2017-12-13 NOTE — Progress Notes (Signed)
IVF has expired.  Dr Jerelyn Charles requested that it be resumed at 81ml/hr

## 2017-12-13 NOTE — Progress Notes (Signed)
Patient concerned about not having a BM.  Order received from Dr Jerelyn Charles for colace

## 2017-12-14 LAB — URINE CULTURE: Culture: 60000 — AB

## 2017-12-14 LAB — BASIC METABOLIC PANEL
Anion gap: 8 (ref 5–15)
BUN: 22 mg/dL (ref 8–23)
CALCIUM: 9 mg/dL (ref 8.9–10.3)
CO2: 23 mmol/L (ref 22–32)
CREATININE: 1.79 mg/dL — AB (ref 0.61–1.24)
Chloride: 112 mmol/L — ABNORMAL HIGH (ref 98–111)
GFR calc Af Amer: 39 mL/min — ABNORMAL LOW (ref >60)
GFR, EST NON AFRICAN AMERICAN: 33 mL/min — AB (ref >60)
GLUCOSE: 133 mg/dL — AB (ref 70–99)
Potassium: 4.9 mmol/L (ref 3.5–5.1)
Sodium: 143 mmol/L (ref 135–145)

## 2017-12-14 MED ORDER — DOCUSATE SODIUM 100 MG PO CAPS: 200.0000 mg | ORAL_CAPSULE | Freq: Two times a day (BID) | ORAL | 0 refills | Status: DC | PRN

## 2017-12-14 MED ORDER — CEFDINIR 300 MG PO CAPS: 300.0000 mg | ORAL_CAPSULE | Freq: Two times a day (BID) | ORAL | 0 refills | Status: DC

## 2017-12-14 MED ORDER — POLYETHYLENE GLYCOL 3350 17 G PO PACK
17.0000 g | PACK | Freq: Once | ORAL | Status: AC
  Administered 2017-12-14: 17 g via ORAL
  Filled 2017-12-14: qty 1

## 2017-12-14 NOTE — Discharge Summary (Signed)
[place] at [place] NAME: Edward Cummings    MR#:  [id number]  DATE OF BIRTH:  1933/03/21  DATE OF ADMISSION:  12/12/2017 ADMITTING PHYSICIAN: Nicholes Mango, MD  DATE OF DISCHARGE: No discharge date for patient encounter.  PRIMARY CARE PHYSICIAN: [name], MD    ADMISSION DIAGNOSIS:  Dehydration [E86.0] Lower urinary tract infectious disease [N39.0] AKI (acute kidney injury) ([place]) [N17.9]  DISCHARGE DIAGNOSIS:  Active Problems:   AKI (acute kidney injury) ([place])   SECONDARY DIAGNOSIS:   Past Medical History:  Diagnosis Date  . Hypertension     HOSPITAL COURSE:   [name] is presenting to the ED with a chief complaint of worsening of weakness for the past 1 week. Denies any nausea vomiting or abdominal pain. Denies any back pain or fever. Urinalysis is abnormal. Creatinine 2.40   *AKI Resolved Treated with IV fluids for rehydration, avoided nephrotoxic agents, ultrasound noted for UV junction obstruction/rated 3 cm bladder mass-Case discussed with Dr. Bell/urology whom recommended outpatient follow-up next week for reevaluation   *Acute constipation  Treated with MiraLAX and Colace twice daily   *Acute Morganella UTI  Resolving Treated initially with Rocephin, sensitivities noted, will complete course with cefdinir   *Acute urinary retention  Resolved Ultrasound noted above-plan of care per above   *hypertension Stable on current regiment   DISCHARGE CONDITIONS:  stable  CONSULTS OBTAINED:    DRUG ALLERGIES:  No Known Allergies  DISCHARGE MEDICATIONS:   Allergies as of 12/14/2017   No Known Allergies     Medication List    TAKE these medications   amLODipine 10 MG tablet Commonly known as:  NORVASC Take 1 tablet by mouth daily.   aspirin EC 81 MG tablet Take 1 tablet by mouth daily.   cefdinir 300 MG  capsule Commonly known as:  OMNICEF Take 1 capsule (300 mg total) by mouth 2 (two) times daily.   docusate sodium 100 MG capsule Commonly known as:  COLACE Take 2 capsules (200 mg total) by mouth 2 (two) times daily as needed for mild constipation.   ferrous sulfate 325 (65 FE) MG tablet Take 325 mg by mouth daily with breakfast.   meloxicam 7.5 MG tablet Commonly known as:  MOBIC Take 1 tablet by mouth daily.   multivitamin capsule Take 1 capsule by mouth daily.        DISCHARGE INSTRUCTIONS:  If you experience worsening of your admission symptoms, develop shortness of breath, life threatening emergency, suicidal or homicidal thoughts you must seek medical attention immediately by calling 911 or calling your MD immediately  if symptoms less severe.  You Must read complete instructions/literature along with all the possible adverse reactions/side effects for all the Medicines you take and that have been prescribed to you. Take any new Medicines after you have completely understood and accept all the possible adverse reactions/side effects.   Please note  You were cared for by a hospitalist during your hospital stay. If you have any questions about your discharge medications or the care you received while you were in the hospital after you are discharged, you can call the unit and asked to speak with the hospitalist on call if the hospitalist that took care of you is not available. Once you are discharged, your primary care physician will handle any further medical issues. Please note that NO REFILLS for any discharge medications will be authorized once you are discharged,  as it is imperative that you return to your primary care physician (or establish a relationship with a primary care physician if you do not have one) for your aftercare needs so that they can reassess your need for medications and monitor your lab values.    Today   CHIEF COMPLAINT:   Chief Complaint  Patient  presents with  . Weakness    HISTORY OF PRESENT ILLNESS:  84 y.o. male with a known history of essential hypertension is presenting to the ED with a chief complaint of worsening of weakness for the past 1 week.  Denies any nausea vomiting or abdominal pain.  Denies any back pain or fever.  Urinalysis is abnormal.  Creatinine 2.40.  Patient denies any past medical history of renal insufficiency.  Hospitalist team is called to admit the patient urinalysis is abnormal with WBC clumps VITAL SIGNS:  Blood pressure (!) 149/67, pulse (!) 59, temperature 97.7 F (36.5 C), temperature source Oral, resp. rate 19, height 5\' 8"  (1.727 m), weight 63.5 kg (140 lb), SpO2 100 %.  I/O:    Intake/Output Summary (Last 24 hours) at 12/14/2017 0937 Last data filed at 12/14/2017 0600 Gross per 24 hour  Intake 1296 ml  Output 725 ml  Net 571 ml    PHYSICAL EXAMINATION:  GENERAL:  84 y.o.-year-old patient lying in the bed with no acute distress.  EYES: Pupils equal, round, reactive to light and accommodation. No scleral icterus. Extraocular muscles intact.  HEENT: Head atraumatic, normocephalic. Oropharynx and nasopharynx clear.  NECK:  Supple, no jugular venous distention. No thyroid enlargement, no tenderness.  LUNGS: Normal breath sounds bilaterally, no wheezing, rales,rhonchi or crepitation. No use of accessory muscles of respiration.  CARDIOVASCULAR: S1, S2 normal. No murmurs, rubs, or gallops.  ABDOMEN: Soft, non-tender, non-distended. Bowel sounds present. No organomegaly or mass.  EXTREMITIES: No pedal edema, cyanosis, or clubbing.  NEUROLOGIC: Cranial nerves II through XII are intact. Muscle strength 5/5 in all extremities. Sensation intact. Gait not checked.  PSYCHIATRIC: The patient is alert and oriented x 3.  SKIN: No obvious rash, lesion, or ulcer.   DATA REVIEW:   CBC Recent Labs  Lab 12/13/17 0453  WBC 5.9  HGB 10.2*  HCT 28.9*  PLT 227    Chemistries  Recent Labs  Lab  12/14/17 0848  NA 143  K 4.9  CL 112*  CO2 23  GLUCOSE 133*  BUN 22  CREATININE 1.79*  CALCIUM 9.0    Cardiac Enzymes No results for input(s): TROPONINI in the last 168 hours.  Microbiology Results  Results for orders placed or performed during the hospital encounter of 12/12/17  Urine culture     Status: Abnormal   Collection Time: 12/12/17 12:14 PM  Result Value Ref Range Status   Specimen Description   Final    URINE, RANDOM Performed at [facility], [address], [place], [place] [address]    Special Requests   Final    NONE Performed at [facility], [address] [place], [place] [address]    Culture 60,000 COLONIES/mL [facility] MORGANII (A)  Final   Report Status 12/14/2017 FINAL  Final   Organism ID, Bacteria MORGANELLA MORGANII (A)  Final      Susceptibility   Morganella morganii - MIC*    AMPICILLIN >=32 RESISTANT Resistant     CEFAZOLIN >=64 RESISTANT Resistant     CEFTRIAXONE <=1 SENSITIVE Sensitive     CIPROFLOXACIN <=0.25 SENSITIVE Sensitive     GENTAMICIN <=1  SENSITIVE Sensitive     IMIPENEM 4 SENSITIVE Sensitive     NITROFURANTOIN 128 RESISTANT Resistant     TRIMETH/SULFA <=20 SENSITIVE Sensitive     AMPICILLIN/SULBACTAM 16 INTERMEDIATE Intermediate     PIP/TAZO <=4 SENSITIVE Sensitive     * 60,000 COLONIES/mL MORGANELLA MORGANII  Urine culture     Status: Abnormal   Collection Time: 12/12/17  1:05 PM  Result Value Ref Range Status   Specimen Description   Final    URINE, RANDOM Performed at [facility], [address] [place], [place] [address]    Special Requests   Final    NONE Performed at [facility], [address] [place], [place] [address]    Culture 60,000 COLONIES/mL [facility] MORGANII (A)  Final   Report Status 12/14/2017 FINAL  Final   Organism ID, Bacteria MORGANELLA MORGANII (A)  Final      Susceptibility   Morganella morganii - MIC*    AMPICILLIN >=32 RESISTANT Resistant      CEFAZOLIN >=64 RESISTANT Resistant     CEFTRIAXONE <=1 SENSITIVE Sensitive     CIPROFLOXACIN <=0.25 SENSITIVE Sensitive     GENTAMICIN <=1 SENSITIVE Sensitive     IMIPENEM 1 SENSITIVE Sensitive     NITROFURANTOIN RESISTANT Resistant     TRIMETH/SULFA <=20 SENSITIVE Sensitive     AMPICILLIN/SULBACTAM 16 INTERMEDIATE Intermediate     PIP/TAZO <=4 SENSITIVE Sensitive     * 60,000 COLONIES/mL MORGANELLA MORGANII    RADIOLOGY:  US Renal  Result Date: 12/12/2017 CLINICAL DATA:  84 year old male with acute renal injury. EXAM: RENAL / URINARY TRACT ULTRASOUND COMPLETE COMPARISON:  No prior renal imaging. FINDINGS: Right Kidney: Length: 8.8 centimeters. Cortical echogenicity within normal limits. Mild right hydronephrosis (image 6). The proximal right ureter is mildly dilated on image 7. A small simple 15 millimeter lower pole cyst is noted. Left Kidney: Length: 7.8 centimeters. The left kidney is not as well visualized as the right. There is evidence of mild left hydronephrosis (images 22 and 23) similar to the right side. Bladder: There is a solid, vascular lobulated echogenic mass at the base of the bladder (image 43), measuring about 3.8 centimeters. See image 46. IMPRESSION: 1. Lobulated, vascular soft tissue mass at the base of the bladder, estimated at 3.8 cm. 2. Mild bilateral hydronephrosis, likely bilateral UVJ obstruction from #1. 3. Recommend Urology consultation. Electronically Signed   By: Genevie Ann M.D.   On: 12/12/2017 15:31    EKG:   Orders placed or performed during the hospital encounter of 12/12/17  . ED EKG  . ED EKG      Management plans discussed with the patient, family and they are in agreement.  CODE STATUS:     Code Status Orders  (From admission, onward)        Start     Ordered   12/12/17 1550  Full code  Continuous     12/12/17 1549    Code Status History    This patient has a current code status but no historical code status.      TOTAL TIME TAKING  CARE OF THIS PATIENT: 45 minutes.    Edward Cummings M.D on 12/14/2017 at 9:37 AM  Between 7am to 6pm - Pager - [phone]  After 6pm go to [email or web address] - password [email or web address] [place] [facility]  Office  [phone]  CC: Primary care physician; [name], MD   Note: This dictation was prepared with Dragon dictation along with smaller  Company secretary. Any transcriptional errors that result from this process are unintentional.

## 2017-12-14 NOTE — Progress Notes (Signed)
Discharge order received. Patient is alert and oriented. Vital signs stable . No signs of acute distress. Discharge instructions given. Patient verbalized understanding. No other issues noted at this time.   

## 2017-12-14 NOTE — Care Management Important Message (Signed)
Copy of signed IM left with patient in room.  

## 2017-12-19 ENCOUNTER — Encounter

## 2017-12-19 ENCOUNTER — Ambulatory Visit: Payer: Medicare HMO | Admitting: Urology

## 2017-12-19 ENCOUNTER — Encounter: Payer: Self-pay | Admitting: Urology

## 2017-12-19 VITALS — BP 144/60 | HR 62 | Resp 16 | Ht 68.0 in | Wt 137.6 lb

## 2017-12-19 DIAGNOSIS — R310 Gross hematuria: Secondary | ICD-10-CM

## 2017-12-19 DIAGNOSIS — N3289 Other specified disorders of bladder: Secondary | ICD-10-CM

## 2017-12-19 LAB — MICROSCOPIC EXAMINATION: RBC, UA: >30 /hpf — ABNORMAL HIGH (ref 0–2)

## 2017-12-19 LAB — URINALYSIS, COMPLETE
BILIRUBIN UA: NEGATIVE
Glucose, UA: NEGATIVE
KETONES UA: NEGATIVE
Nitrite, UA: NEGATIVE
SPEC GRAV UA: 1.02 (ref 1.005–1.030)
Urobilinogen, Ur: 0.2 mg/dL (ref 0.2–1.0)
pH, UA: 5 (ref 5.0–7.5)

## 2017-12-19 NOTE — Progress Notes (Signed)
12/19/2017 2:47 PM   Edward Cummings Sep 09, 1932 [id number]  Referring provider: [name], MD [address] [place], [place] [address]  Chief Complaint  Patient presents with  . Hospitalization Follow-up    HPI: Edward Cummings is an 84 year old male who was admitted to [facility] in early July 2019 with a weeklong history of weakness.  He was found to have a creatinine of 2.4.  A renal ultrasound was performed which showed a 4 cm posterior wall bladder mass and mild bilateral hydronephrosis.  He refused Foley catheter placement.  He had clinical improvement with fluid resuscitation and his creatinine was 1.75 at the time of discharge.  He was seen as an inpatient by Dr. Gloriann Loan and was scheduled for follow-up here.  He denies previous history of urologic problems.  He has had intermittent gross hematuria.  He has a greater than 50-pack-year smoking history.   PMH: Past Medical History:  Diagnosis Date  . Hypertension     Surgical History: History reviewed. No pertinent surgical history.  Home Medications:  Allergies as of 12/19/2017   No Known Allergies     Medication List        Accurate as of 12/19/17  2:47 PM. Always use your most recent med list.          amLODipine 10 MG tablet Commonly known as:  NORVASC Take 1 tablet by mouth daily.   aspirin EC 81 MG tablet Take 1 tablet by mouth daily.   cefdinir 300 MG capsule Commonly known as:  OMNICEF Take 1 capsule (300 mg total) by mouth 2 (two) times daily.   docusate sodium 100 MG capsule Commonly known as:  COLACE Take 2 capsules (200 mg total) by mouth 2 (two) times daily as needed for mild constipation.   ferrous sulfate 325 (65 FE) MG tablet Take 325 mg by mouth daily with breakfast.   meloxicam 7.5 MG tablet Commonly known as:  MOBIC Take 1 tablet by mouth daily.   multivitamin capsule Take 1 capsule by mouth daily.       Allergies: No Known Allergies  Family History: Family History  Problem Relation  Age of Onset  . Prostate cancer Neg Hx   . Kidney cancer Neg Hx   . Bladder Cancer Neg Hx     Social History:  reports that he has quit smoking. His smokeless tobacco use includes chew. He reports that he drinks alcohol. He reports that he does not use drugs.  ROS: UROLOGY Frequent Urination?: Yes Hard to postpone urination?: No Burning/pain with urination?: Yes Get up at night to urinate?: Yes Leakage of urine?: Yes Urine stream starts and stops?: Yes Trouble starting stream?: Yes Do you have to strain to urinate?: No Blood in urine?: Yes Urinary tract infection?: No Sexually transmitted disease?: No Injury to kidneys or bladder?: Yes Painful intercourse?: No Weak stream?: Yes Erection problems?: Yes Penile pain?: No  Gastrointestinal Nausea?: No Vomiting?: No Indigestion/heartburn?: No Diarrhea?: No Constipation?: Yes  Constitutional Fever: No Night sweats?: No Weight loss?: No Fatigue?: No  Skin Skin rash/lesions?: No Itching?: No  Eyes Blurred vision?: No Double vision?: No  Ears/Nose/Throat Sore throat?: No Sinus problems?: No  Hematologic/Lymphatic Swollen glands?: No Easy bruising?: No  Cardiovascular Leg swelling?: No Chest pain?: No  Respiratory Cough?: No Shortness of breath?: No  Endocrine Excessive thirst?: No  Musculoskeletal Back pain?: No Joint pain?: No  Neurological Headaches?: No Dizziness?: Yes  Psychologic Depression?: No Anxiety?: No  Physical Exam: BP (!) 144/60  Pulse 62   Resp 16   Ht 5\' 8"  (1.727 m)   Wt 137 lb 9.6 oz (62.4 kg)   SpO2 96%   BMI 20.92 kg/m   Constitutional:  Alert and oriented, No acute distress. HEENT: [place] AT, moist mucus membranes.  Trachea midline, no masses. Cardiovascular: No clubbing, cyanosis, or edema.  RRR Respiratory: Normal respiratory effort, no increased work of breathing.  Lungs clear GI: Abdomen is soft, nontender, nondistended, no abdominal masses GU: No CVA  tenderness Lymph: No cervical or inguinal lymphadenopathy. Skin: No rashes, bruises or suspicious lesions. Neurologic: Grossly intact, no focal deficits, moving all 4 extremities. Psychiatric: Normal mood and affect.  Laboratory Data:  Urinalysis Dipstick 3+ blood trace leukocytes nitrite negative; micro 11-30 WBC, >30 RBC  Pertinent Imaging:  Results for orders placed during the hospital encounter of 12/12/17  US RENAL   Narrative CLINICAL DATA:  84 year old male with acute renal injury.  EXAM: RENAL / URINARY TRACT ULTRASOUND COMPLETE  COMPARISON:  No prior renal imaging.  FINDINGS: Right Kidney:  Length: 8.8 centimeters. Cortical echogenicity within normal limits. Mild right hydronephrosis (image 6). The proximal right ureter is mildly dilated on image 7. A small simple 15 millimeter lower pole cyst is noted.  Left Kidney:  Length: 7.8 centimeters. The left kidney is not as well visualized as the right. There is evidence of mild left hydronephrosis (images 22 and 23) similar to the right side.  Bladder:  There is a solid, vascular lobulated echogenic mass at the base of the bladder (image 43), measuring about 3.8 centimeters. See image 46.  IMPRESSION: 1. Lobulated, vascular soft tissue mass at the base of the bladder, estimated at 3.8 cm. 2. Mild bilateral hydronephrosis, likely bilateral UVJ obstruction from #1. 3. Recommend Urology consultation.   Electronically Signed   By: Genevie Ann M.D.   On: 12/12/2017 15:31     Assessment & Plan:   84 year old male with intermittent gross hematuria and a large bladder mass seen on renal ultrasound.  His serum creatinine was repeated today and if improving will schedule a CT urogram.  Based on his ultrasonic findings would forego office cystoscopy and schedule cystoscopy under anesthesia with possible TURBT and possible ureteral stent placement based on CT findings.    The procedure was discussed in detail including  potential risks of bleeding, infection, bladder injury as well as risks of anesthesia and the potential need for additional treatment.  He indicated all questions were answered and desires to schedule.  He will be notified with his CT results.   Abbie Sons, [place] [address] [place],  [address] [phone]

## 2017-12-20 LAB — CREATININE, SERUM
CREATININE: 2.37 mg/dL — AB (ref 0.76–1.27)
GFR calc Af Amer: 28 mL/min/{1.73_m2} — ABNORMAL LOW (ref >59)
GFR calc non Af Amer: 24 mL/min/{1.73_m2} — ABNORMAL LOW (ref >59)

## 2017-12-21 DIAGNOSIS — D509 Iron deficiency anemia, unspecified: Secondary | ICD-10-CM | POA: Diagnosis not present

## 2017-12-21 DIAGNOSIS — R399 Unspecified symptoms and signs involving the genitourinary system: Secondary | ICD-10-CM | POA: Diagnosis not present

## 2017-12-21 DIAGNOSIS — H53002 Unspecified amblyopia, left eye: Secondary | ICD-10-CM | POA: Diagnosis not present

## 2017-12-21 DIAGNOSIS — C679 Malignant neoplasm of bladder, unspecified: Secondary | ICD-10-CM | POA: Diagnosis not present

## 2018-01-02 DIAGNOSIS — R0602 Shortness of breath: Secondary | ICD-10-CM | POA: Diagnosis not present

## 2018-01-02 DIAGNOSIS — R12 Heartburn: Secondary | ICD-10-CM | POA: Diagnosis not present

## 2018-01-02 DIAGNOSIS — I10 Essential (primary) hypertension: Secondary | ICD-10-CM | POA: Diagnosis not present

## 2018-01-02 DIAGNOSIS — C679 Malignant neoplasm of bladder, unspecified: Secondary | ICD-10-CM | POA: Diagnosis not present

## 2018-01-03 ENCOUNTER — Telehealth: Payer: Self-pay | Admitting: Urology

## 2018-01-03 DIAGNOSIS — N3289 Other specified disorders of bladder: Secondary | ICD-10-CM

## 2018-01-03 DIAGNOSIS — R310 Gross hematuria: Secondary | ICD-10-CM

## 2018-01-03 NOTE — Telephone Encounter (Signed)
Entered

## 2018-01-03 NOTE — Telephone Encounter (Signed)
Patient's creatine is high and CT thinks he needs it without contrast. Can you out in a new order to do it without please. His scan is tomorrow and I will need to get it approved today. Please and thank you!!!   Sharyn Lull

## 2018-01-04 ENCOUNTER — Ambulatory Visit
Admission: RE | Admit: 2018-01-04 | Discharge: 2018-01-04 | Disposition: A | Discharge status: Home / Self Care | Payer: Medicare HMO | Source: Ambulatory Visit | Attending: Urology | Admitting: Urology

## 2018-01-04 DIAGNOSIS — N400 Enlarged prostate without lower urinary tract symptoms: Secondary | ICD-10-CM | POA: Insufficient documentation

## 2018-01-04 DIAGNOSIS — R310 Gross hematuria: Secondary | ICD-10-CM | POA: Diagnosis not present

## 2018-01-04 DIAGNOSIS — N3289 Other specified disorders of bladder: Secondary | ICD-10-CM | POA: Diagnosis not present

## 2018-01-04 DIAGNOSIS — J439 Emphysema, unspecified: Secondary | ICD-10-CM | POA: Insufficient documentation

## 2018-01-04 DIAGNOSIS — I700 Atherosclerosis of aorta: Secondary | ICD-10-CM | POA: Insufficient documentation

## 2018-01-04 DIAGNOSIS — R590 Localized enlarged lymph nodes: Secondary | ICD-10-CM | POA: Insufficient documentation

## 2018-01-04 IMAGING — CT CT RENAL STONE PROTOCOL
2 of 4 series · 15 of 46 positions shown, 17 images · non-contrast
Comparison: [DATE] renal sonogram.

CLINICAL DATA: Gross hematuria. Dysuria. Reported history of
bladder mass.

EXAM:
CT ABDOMEN AND PELVIS WITHOUT CONTRAST
TECHNIQUE: Multidetector CT imaging of the abdomen and pelvis was performed
following the standard protocol without IV contrast.

[Series 2: renal stone · axial · 0.65mm/px · z∈[-1561,-1221]mm · 12 of 82 slices shown, 14 images (1 of 2)]
[im 7/82  soft-tissue]
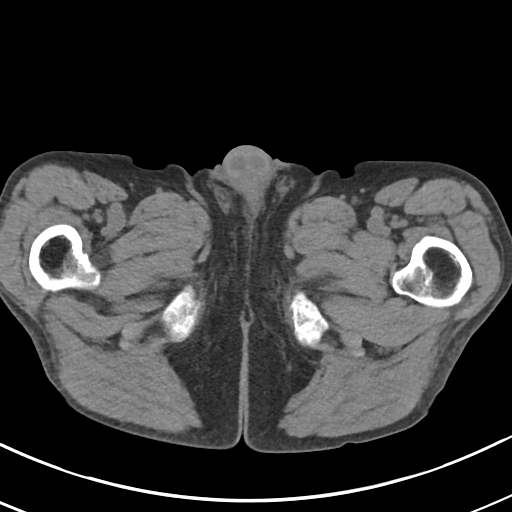
[im 7/82  bone]
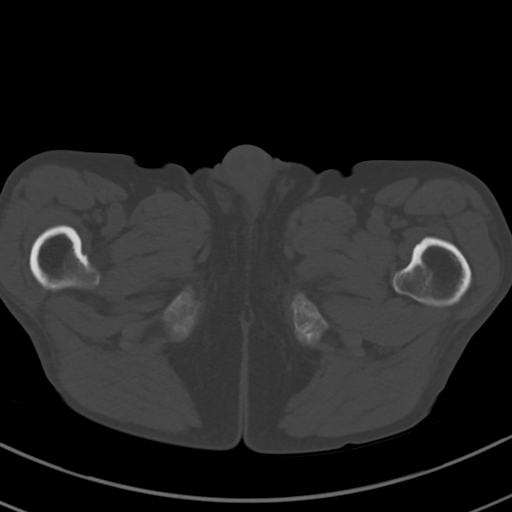
[im 13/82  soft-tissue]
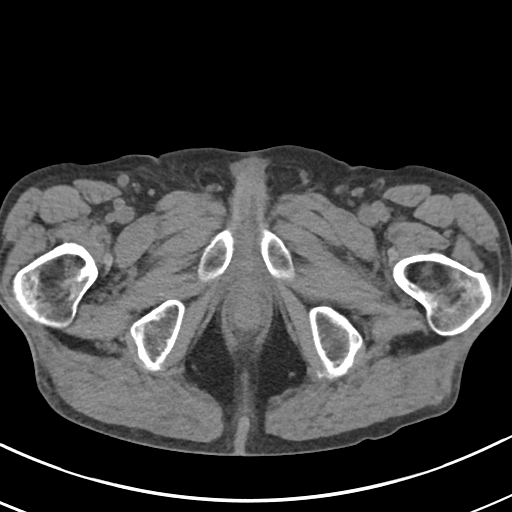
[im 19/82  soft-tissue]
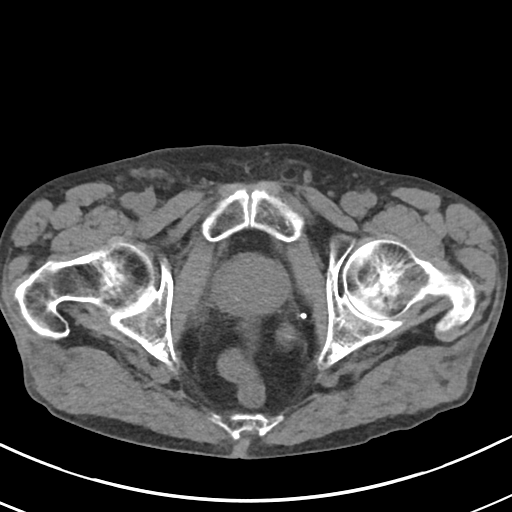
[im 25/82  soft-tissue]
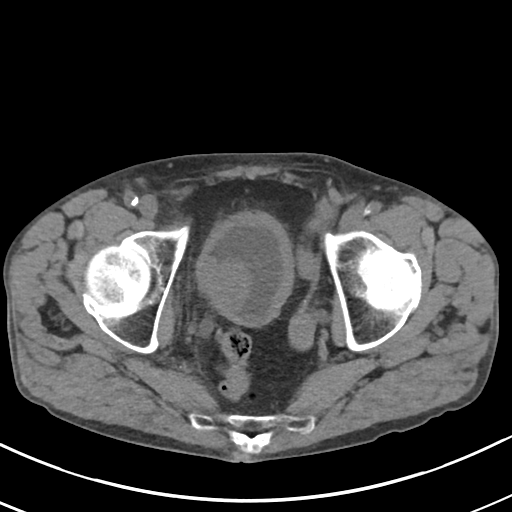
[im 32/82  soft-tissue]
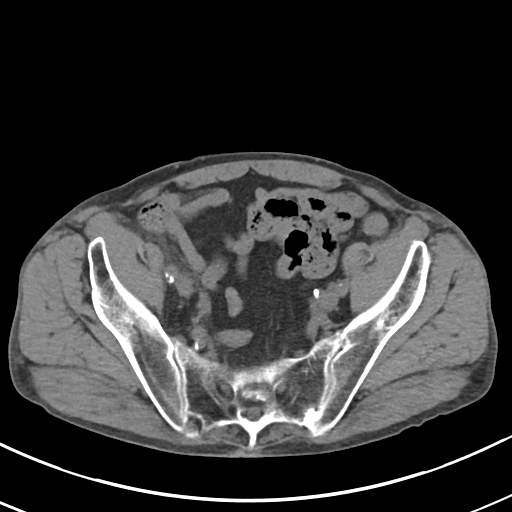
[im 38/82  soft-tissue]
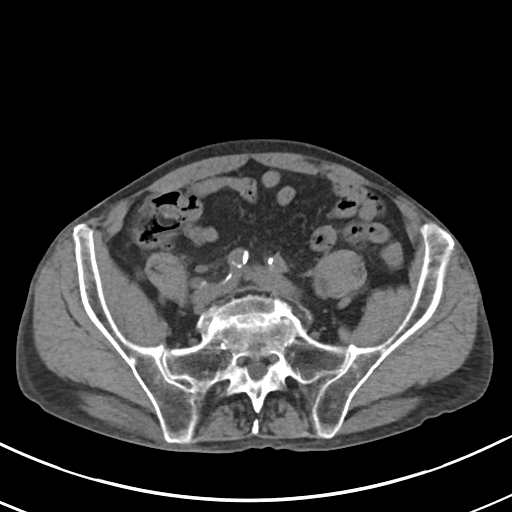
[im 44/82  soft-tissue]
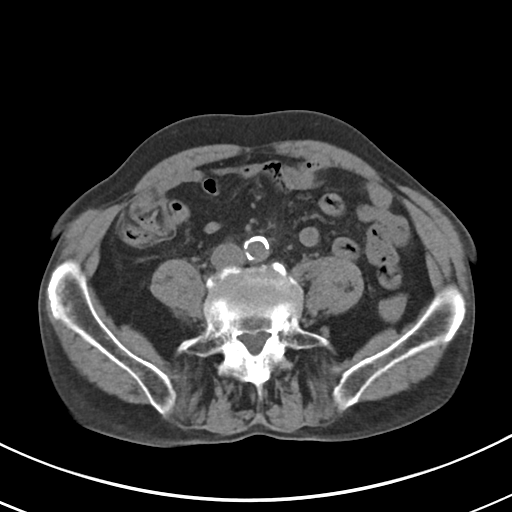
[im 50/82  soft-tissue]
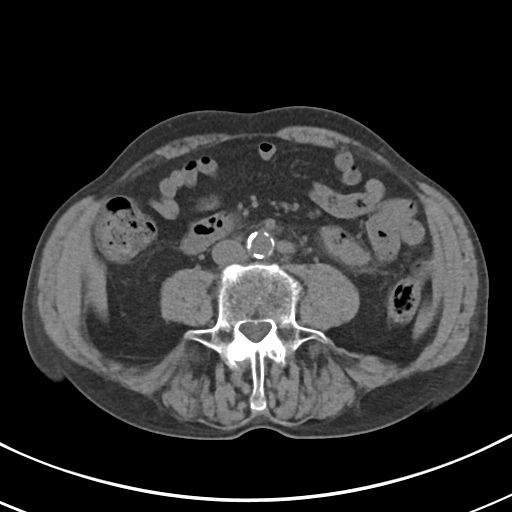
[im 57/82  soft-tissue]
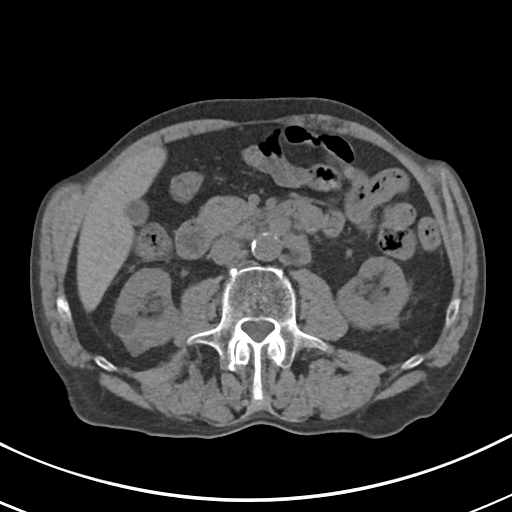
[im 57/82  bone]
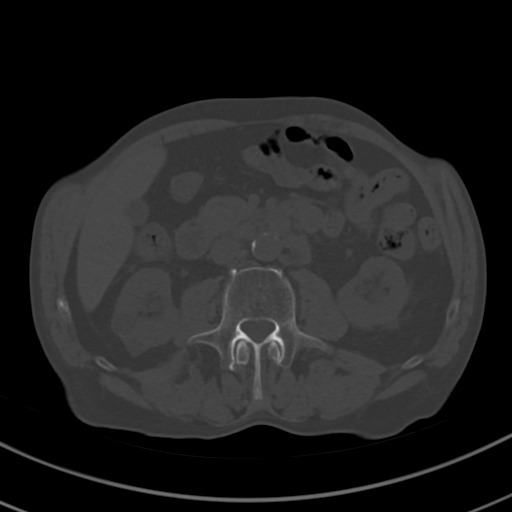
[im 63/82  soft-tissue]
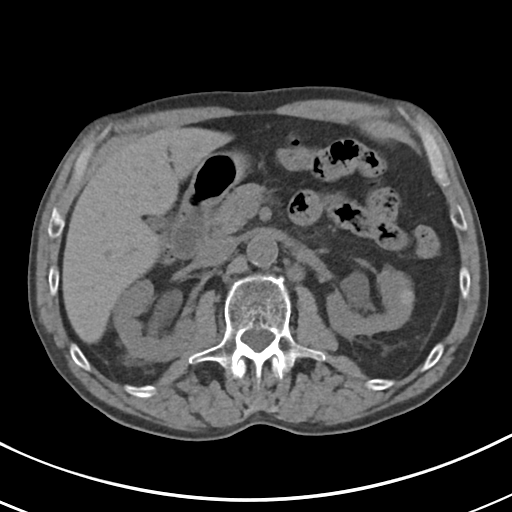
[im 69/82  soft-tissue]
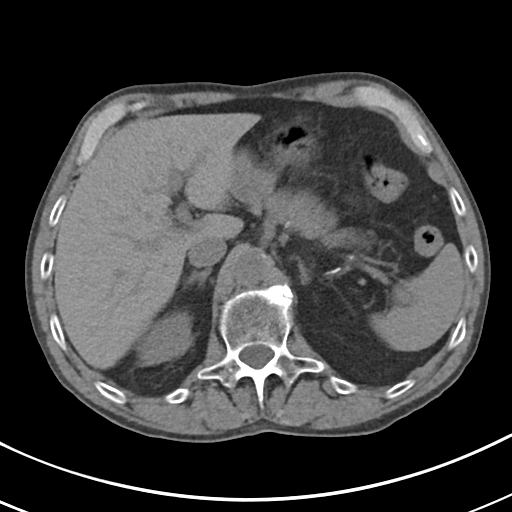
[im 75/82  soft-tissue]
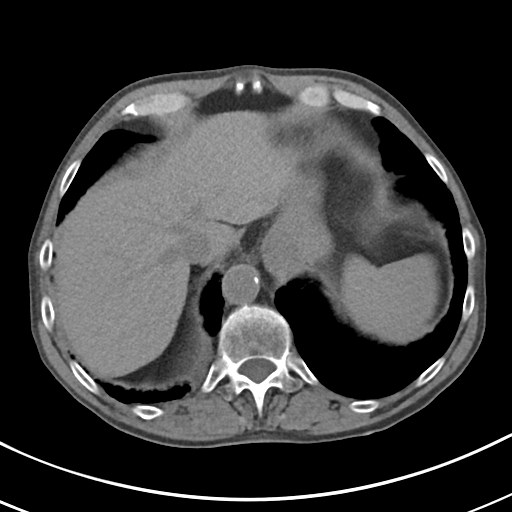

[Series 4: renal stone · coronal · 0.63mm/px · 3 of 128 slices shown (2 of 2)]
[im 43/128  soft-tissue]
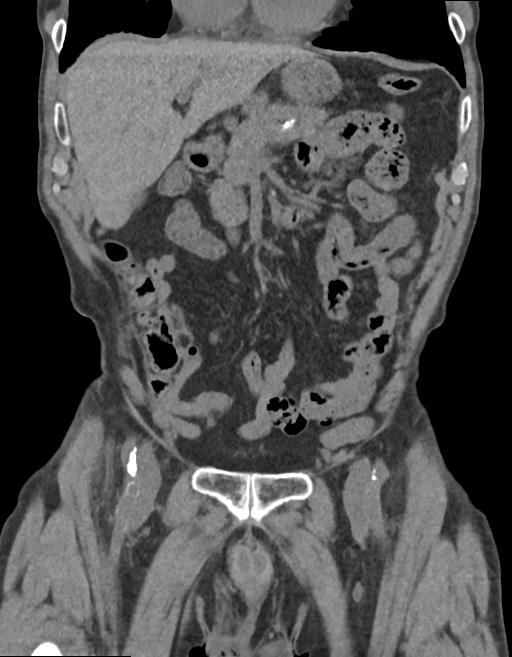
[im 57/128  soft-tissue]
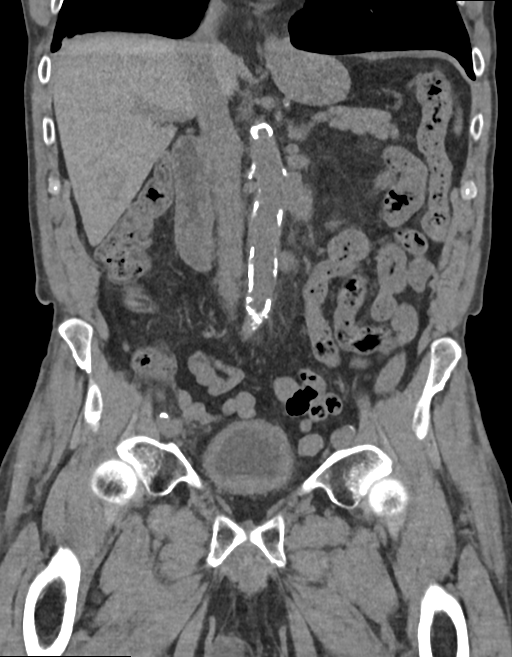
[im 71/128  soft-tissue]
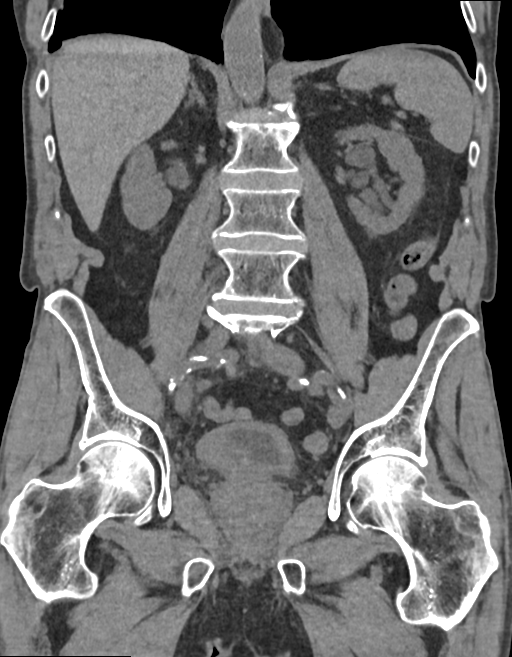

[15 of 46 positions shown; findings below may reference images not displayed]

FINDINGS: Lower chest: Emphysema and nonspecific patchy subpleural
reticulation at the lung bases. Right coronary atherosclerosis.

Hepatobiliary: Normal liver size. No liver mass. Normal gallbladder
with no radiopaque cholelithiasis. No biliary ductal dilatation.

Pancreas: Normal, with no mass or duct dilation.

Spleen: Normal size. No mass.

Adrenals/Urinary Tract: Normal adrenals. There is a polypoid 4.1 x
3.0 x 3.2 cm right posterior bladder wall mass (series 2/image 58),
with associated mild right hydroureteronephrosis. No left
hydronephrosis. Normal caliber left ureter. No renal or ureteral
stones. No bladder stones. Parapelvic renal cysts in the left
kidney. Simple lower right renal cysts, largest 1.6 cm. Homogeneous
hyperdense 0.9 cm renal cortical lesion in the lateral upper left
kidney with density 68 HU (series 2/image 20). No additional contour
deforming renal lesions. Bladder is nondistended. Underlying diffuse
bladder wall thickening.

Stomach/Bowel: Normal non-distended stomach. Normal caliber small
bowel with no small bowel wall thickening. Normal appendix. Normal
large bowel with no diverticulosis, large bowel wall thickening or
pericolonic fat stranding.

Vascular/Lymphatic: Atherosclerotic nonaneurysmal abdominal aorta.
Multiple mildly enlarged aortocaval and left para-aortic nodes.
Representative 1.2 cm aortocaval node (series 2/image 27).
Representative 1.3 cm left para-aortic node (series 2/image 21).

Reproductive: Mildly enlarged prostate.

Other: No pneumoperitoneum, ascites or focal fluid collection.

Musculoskeletal: No aggressive appearing focal osseous lesions.
Moderate lumbar spondylosis.
IMPRESSION: 1. Polypoid 4.1 cm right posterior bladder wall mass with associated
mild right hydroureteronephrosis. Primary bladder malignancy is the
diagnosis of exclusion. Cystoscopic correlation advised.
2. Retroperitoneal lymphadenopathy involving the aortocaval and left
para-aortic chains, suspicious for metastatic disease.
3. Mildly enlarged prostate with underlying diffuse bladder wall
thickening suggestive of chronic bladder outlet obstruction.
4. Hyperdense 0.9 cm upper left renal cortical lesion,
indeterminate. MRI (preferred) or CT abdomen without and with IV
contrast recommended for further characterization when clinically
feasible.
5. Aortic Atherosclerosis ([Z3]-[Z3]) and Emphysema ([Z3]-[Z3]).

## 2018-01-11 ENCOUNTER — Telehealth: Payer: Self-pay | Admitting: Urology

## 2018-01-11 NOTE — Telephone Encounter (Signed)
Pt's wife called today asking about CT results from 7/26.  He doesn't have a return appt.  Discharge said to schedule CT ASAP.

## 2018-01-11 NOTE — Telephone Encounter (Signed)
-----   Message from Abbie Sons, MD sent at 01/07/2018  1:48 PM EDT ----- He has a large bladder mass discovered on ultrasound.  I had previously discussed scheduling TURBT.  The CT confirms the large mass however there does not appear to be extension through the bladder.  Recommend scheduling TURBT, bilateral retrogrades and possible right ureteral stent placement.  I will bring by the order sheet ----- Message ----- From: Ranell Patrick, RN Sent: 01/07/2018  10:36 AM To: Abbie Sons, MD  Patient would like a call back regarding results

## 2018-01-11 NOTE — Telephone Encounter (Signed)
Patient's wife notified of Dr Dene Gentry note below. Surgery has been scheduled for 01/25/2018. Questions answered. Wife voices understanding.

## 2018-01-15 ENCOUNTER — Other Ambulatory Visit: Payer: Self-pay | Admitting: Radiology

## 2018-01-15 DIAGNOSIS — N3289 Other specified disorders of bladder: Secondary | ICD-10-CM

## 2018-01-17 ENCOUNTER — Ambulatory Visit: Payer: Self-pay

## 2018-01-17 ENCOUNTER — Other Ambulatory Visit: Payer: Self-pay

## 2018-01-17 ENCOUNTER — Encounter
Admission: RE | Admit: 2018-01-17 | Discharge: 2018-01-17 | Disposition: A | Discharge status: Home / Self Care | Payer: Medicare HMO | Source: Ambulatory Visit | Attending: Urology | Admitting: Urology

## 2018-01-17 DIAGNOSIS — Z01812 Encounter for preprocedural laboratory examination: Secondary | ICD-10-CM | POA: Insufficient documentation

## 2018-01-17 HISTORY — DX: Chronic kidney disease, unspecified: N18.9

## 2018-01-17 HISTORY — DX: Depression, unspecified: F32.A

## 2018-01-17 HISTORY — DX: Major depressive disorder, single episode, unspecified: F32.9

## 2018-01-17 HISTORY — DX: Myoneural disorder, unspecified: G70.9

## 2018-01-17 HISTORY — DX: Anemia, unspecified: D64.9

## 2018-01-17 LAB — BASIC METABOLIC PANEL
ANION GAP: 5 (ref 5–15)
BUN: 33 mg/dL — ABNORMAL HIGH (ref 8–23)
CALCIUM: 9.4 mg/dL (ref 8.9–10.3)
CO2: 25 mmol/L (ref 22–32)
Chloride: 113 mmol/L — ABNORMAL HIGH (ref 98–111)
Creatinine, Ser: 2.2 mg/dL — ABNORMAL HIGH (ref 0.61–1.24)
GFR calc Af Amer: 30 mL/min — ABNORMAL LOW (ref >60)
GFR, EST NON AFRICAN AMERICAN: 26 mL/min — AB (ref >60)
Glucose, Bld: 96 mg/dL (ref 70–99)
Potassium: 5.5 mmol/L — ABNORMAL HIGH (ref 3.5–5.1)
Sodium: 143 mmol/L (ref 135–145)

## 2018-01-17 NOTE — Patient Instructions (Signed)
Your procedure is scheduled on: Friday 01/25/18.  Report to DAY SURGERY DEPARTMENT LOCATED ON 2ND FLOOR MEDICAL MALL ENTRANCE. To find out your arrival time please call [phone] between 1PM - 3PM on Thursday 01/24/18.  Remember: Instructions that are not followed completely may result in serious medical risk, up to and including death, or upon the discretion of your surgeon and anesthesiologist your surgery may need to be rescheduled.     _X__ 1. Do not eat food after midnight the night before your procedure.                 No gum chewing or hard candies. You may drink clear liquids up to 2 hours                 before you are scheduled to arrive for your surgery- DO not drink clear                 liquids within 2 hours of the start of your surgery.                 Clear Liquids include:  water, apple juice without pulp, clear carbohydrate                 drink such as Clearfast or Gatorade, Black Coffee or Tea (Do not add                 anything to coffee or tea).  __X__2.  On the morning of surgery brush your teeth with toothpaste and water, you may rinse your mouth with mouthwash if you wish.  Do not swallow any toothpaste of mouthwash.     _X__ 3.  No Alcohol for 24 hours before or after surgery.   _X__ 4.  Do Not Smoke or use e-cigarettes For 24 Hours Prior to Your Surgery.                 Do not use any chewable tobacco products for at least 6 hours prior to                 surgery.  ____  5.  Bring all medications with you on the day of surgery if instructed.   __X__  6.  Notify your doctor if there is any change in your medical condition      (cold, fever, infections).     Do not wear jewelry, make-up, hairpins, clips or nail polish. Do not wear lotions, powders, or perfumes.  Do not shave 48 hours prior to surgery. Men may shave face and neck. Do not bring valuables to the hospital.    [facility] is not responsible for any belongings or valuables.  Contacts,  dentures/partials or body piercings may not be worn into surgery. Bring a case for your contacts, glasses or hearing aids, a denture cup will be supplied. Leave your suitcase in the car. After surgery it may be brought to your room. For patients admitted to the hospital, discharge time is determined by your treatment team.   Patients discharged the day of surgery will not be allowed to drive home.   Please read over the following fact sheets that you were given:   MRSA Information  __X__ Take these medicines the morning of surgery with A SIP OF WATER:     1. amLODipine (NORVASC) 10 MG tablet  2. prednisoLONE acetate (PRED FORTE) 1 % ophthalmic suspension  3. tamsulosin (FLOMAX) 0.4 MG CAPS capsule  4.  5.  6.  ____ Fleet Enema (as directed)   __X__ Use CHG Soap/SAGE wipes as directed   __X__ Stop Blood Thinners Coumadin/Plavix/Xarelto/Pleta/Pradaxa/Eliquis/Effient/Aspirin   ____ Stop Anti-inflammatories 7 days before surgery such as Advil, Ibuprofen, Motrin, BC or Goodies Powder, Naprosyn, Naproxen, Aleve, Aspirin, Meloxicam TODAY. May take Tylenol if needed for pain or discomfort.   Marland Kitchen

## 2018-01-17 NOTE — Pre-Procedure Instructions (Signed)
Faxed Met B results to Dr. Bernardo Heater and Dr. Lavera Guise. Fax confirmation received.

## 2018-01-18 ENCOUNTER — Telehealth: Payer: Self-pay | Admitting: Radiology

## 2018-01-18 LAB — URINE CULTURE

## 2018-01-18 NOTE — Telephone Encounter (Signed)
Notified patient of increased potassium level from 01/17/2018. Recommended that patient should discontinue multivitamin & avoid potassium rich foods such as bananas, cantaloupe & potatoes until after surgery scheduled 01/25/2018. Patient voices understanding & states an appointment has been made for 01/23/2018 with PCP regarding this as well.

## 2018-01-23 DIAGNOSIS — R12 Heartburn: Secondary | ICD-10-CM | POA: Diagnosis not present

## 2018-01-23 DIAGNOSIS — R0602 Shortness of breath: Secondary | ICD-10-CM | POA: Diagnosis not present

## 2018-01-23 DIAGNOSIS — I10 Essential (primary) hypertension: Secondary | ICD-10-CM | POA: Diagnosis not present

## 2018-01-23 DIAGNOSIS — C679 Malignant neoplasm of bladder, unspecified: Secondary | ICD-10-CM | POA: Diagnosis not present

## 2018-01-24 MED ORDER — CEFAZOLIN SODIUM-DEXTROSE 2-4 GM/100ML-% IV SOLN
2.0000 g | INTRAVENOUS | Status: AC
  Administered 2018-01-25: 2 g via INTRAVENOUS

## 2018-01-25 ENCOUNTER — Encounter
Admission: RE | Disposition: A | Discharge status: Home / Self Care | Payer: Self-pay | Source: Ambulatory Visit | Attending: Urology

## 2018-01-25 ENCOUNTER — Ambulatory Visit: Payer: Medicare HMO | Admitting: Certified Registered Nurse Anesthetist

## 2018-01-25 ENCOUNTER — Encounter: Payer: Self-pay | Admitting: Emergency Medicine

## 2018-01-25 ENCOUNTER — Ambulatory Visit
Admission: RE | Admit: 2018-01-25 | Discharge: 2018-01-25 | Disposition: A | Discharge status: Home / Self Care | Payer: Medicare HMO | Source: Ambulatory Visit | Attending: Urology | Admitting: Urology

## 2018-01-25 DIAGNOSIS — C672 Malignant neoplasm of lateral wall of bladder: Secondary | ICD-10-CM | POA: Diagnosis not present

## 2018-01-25 DIAGNOSIS — N3289 Other specified disorders of bladder: Secondary | ICD-10-CM

## 2018-01-25 DIAGNOSIS — I10 Essential (primary) hypertension: Secondary | ICD-10-CM | POA: Diagnosis not present

## 2018-01-25 DIAGNOSIS — N1330 Unspecified hydronephrosis: Secondary | ICD-10-CM | POA: Diagnosis not present

## 2018-01-25 DIAGNOSIS — D649 Anemia, unspecified: Secondary | ICD-10-CM | POA: Insufficient documentation

## 2018-01-25 DIAGNOSIS — Z87891 Personal history of nicotine dependence: Secondary | ICD-10-CM | POA: Insufficient documentation

## 2018-01-25 DIAGNOSIS — N179 Acute kidney failure, unspecified: Secondary | ICD-10-CM | POA: Diagnosis not present

## 2018-01-25 DIAGNOSIS — Z79899 Other long term (current) drug therapy: Secondary | ICD-10-CM | POA: Insufficient documentation

## 2018-01-25 DIAGNOSIS — Z7982 Long term (current) use of aspirin: Secondary | ICD-10-CM | POA: Insufficient documentation

## 2018-01-25 DIAGNOSIS — I129 Hypertensive chronic kidney disease with stage 1 through stage 4 chronic kidney disease, or unspecified chronic kidney disease: Secondary | ICD-10-CM | POA: Diagnosis not present

## 2018-01-25 DIAGNOSIS — C674 Malignant neoplasm of posterior wall of bladder: Secondary | ICD-10-CM | POA: Insufficient documentation

## 2018-01-25 DIAGNOSIS — D631 Anemia in chronic kidney disease: Secondary | ICD-10-CM | POA: Diagnosis not present

## 2018-01-25 DIAGNOSIS — F329 Major depressive disorder, single episode, unspecified: Secondary | ICD-10-CM | POA: Insufficient documentation

## 2018-01-25 DIAGNOSIS — N189 Chronic kidney disease, unspecified: Secondary | ICD-10-CM | POA: Diagnosis not present

## 2018-01-25 HISTORY — PX: CYSTOSCOPY W/ RETROGRADES: SHX1426

## 2018-01-25 HISTORY — PX: TRANSURETHRAL RESECTION OF BLADDER TUMOR: SHX2575

## 2018-01-25 HISTORY — PX: CYSTOSCOPY WITH STENT PLACEMENT: SHX5790

## 2018-01-25 LAB — BASIC METABOLIC PANEL
Anion gap: 7 (ref 5–15)
BUN: 31 mg/dL — ABNORMAL HIGH (ref 8–23)
CHLORIDE: 111 mmol/L (ref 98–111)
CO2: 23 mmol/L (ref 22–32)
Calcium: 9.2 mg/dL (ref 8.9–10.3)
Creatinine, Ser: 2.34 mg/dL — ABNORMAL HIGH (ref 0.61–1.24)
GFR, EST AFRICAN AMERICAN: 28 mL/min — AB (ref >60)
GFR, EST NON AFRICAN AMERICAN: 24 mL/min — AB (ref >60)
Glucose, Bld: 105 mg/dL — ABNORMAL HIGH (ref 70–99)
POTASSIUM: 4.2 mmol/L (ref 3.5–5.1)
SODIUM: 141 mmol/L (ref 135–145)

## 2018-01-25 SURGERY — TURBT (TRANSURETHRAL RESECTION OF BLADDER TUMOR)
Anesthesia: General | Site: Ureter | Wound class: Clean Contaminated

## 2018-01-25 MED ORDER — DEXAMETHASONE SODIUM PHOSPHATE 10 MG/ML IJ SOLN
INTRAMUSCULAR | Status: DC | PRN
  Administered 2018-01-25: 5 mg via INTRAVENOUS

## 2018-01-25 MED ORDER — PROPOFOL 10 MG/ML IV BOLUS
INTRAVENOUS | Status: AC
  Filled 2018-01-25: qty 20

## 2018-01-25 MED ORDER — SUGAMMADEX SODIUM 200 MG/2ML IV SOLN
INTRAVENOUS | Status: DC | PRN
  Administered 2018-01-25: 150 mg via INTRAVENOUS

## 2018-01-25 MED ORDER — PHENYLEPHRINE HCL 10 MG/ML IJ SOLN
INTRAMUSCULAR | Status: DC | PRN
  Administered 2018-01-25 (×2): 200 ug via INTRAVENOUS

## 2018-01-25 MED ORDER — FAMOTIDINE 20 MG PO TABS
20.0000 mg | ORAL_TABLET | Freq: Once | ORAL | Status: AC
  Administered 2018-01-25: 20 mg via ORAL

## 2018-01-25 MED ORDER — LACTATED RINGERS IV SOLN
INTRAVENOUS | Status: DC
  Administered 2018-01-25: 08:00:00 via INTRAVENOUS

## 2018-01-25 MED ORDER — OXYBUTYNIN CHLORIDE 5 MG PO TABS
ORAL_TABLET | ORAL | Status: AC
  Filled 2018-01-25: qty 1

## 2018-01-25 MED ORDER — ROCURONIUM BROMIDE 100 MG/10ML IV SOLN
INTRAVENOUS | Status: DC | PRN
  Administered 2018-01-25: 5 mg via INTRAVENOUS
  Administered 2018-01-25: 25 mg via INTRAVENOUS
  Administered 2018-01-25: 10 mg via INTRAVENOUS

## 2018-01-25 MED ORDER — SULFAMETHOXAZOLE-TRIMETHOPRIM 800-160 MG PO TABS: 1.0000 | ORAL_TABLET | Freq: Two times a day (BID) | ORAL | 0 refills | Status: DC

## 2018-01-25 MED ORDER — SUCCINYLCHOLINE CHLORIDE 20 MG/ML IJ SOLN
INTRAMUSCULAR | Status: DC | PRN
  Administered 2018-01-25: 100 mg via INTRAVENOUS

## 2018-01-25 MED ORDER — IOTHALAMATE MEGLUMINE 43 % IV SOLN
INTRAVENOUS | Status: DC | PRN
  Administered 2018-01-25: 45 mL via URETHRAL

## 2018-01-25 MED ORDER — MIDAZOLAM HCL 2 MG/2ML IJ SOLN
INTRAMUSCULAR | Status: AC
  Filled 2018-01-25: qty 2

## 2018-01-25 MED ORDER — PROPOFOL 10 MG/ML IV BOLUS
INTRAVENOUS | Status: DC | PRN
  Administered 2018-01-25: 130 mg via INTRAVENOUS

## 2018-01-25 MED ORDER — ACETAMINOPHEN 10 MG/ML IV SOLN
INTRAVENOUS | Status: DC | PRN
  Administered 2018-01-25: 1000 mg via INTRAVENOUS

## 2018-01-25 MED ORDER — OXYBUTYNIN CHLORIDE 5 MG PO TABS
5.0000 mg | ORAL_TABLET | Freq: Three times a day (TID) | ORAL | Status: DC
  Administered 2018-01-25: 5 mg via ORAL
  Filled 2018-01-25: qty 1

## 2018-01-25 MED ORDER — ONDANSETRON HCL 4 MG/2ML IJ SOLN: 4.0000 mg | Freq: Once | INTRAMUSCULAR | Status: DC | PRN

## 2018-01-25 MED ORDER — LIDOCAINE HCL (CARDIAC) PF 100 MG/5ML IV SOSY
PREFILLED_SYRINGE | INTRAVENOUS | Status: DC | PRN
  Administered 2018-01-25: 100 mg via INTRAVENOUS

## 2018-01-25 MED ORDER — OXYBUTYNIN CHLORIDE 5 MG PO TABS: ORAL_TABLET | ORAL | 0 refills | Status: DC

## 2018-01-25 MED ORDER — SEVOFLURANE IN SOLN
RESPIRATORY_TRACT | Status: AC
  Filled 2018-01-25: qty 250

## 2018-01-25 MED ORDER — FAMOTIDINE 20 MG PO TABS
ORAL_TABLET | ORAL | Status: AC
Start: 2018-01-25 — End: 2018-01-25
  Administered 2018-01-25: 20 mg via ORAL
  Filled 2018-01-25: qty 1

## 2018-01-25 MED ORDER — FENTANYL CITRATE (PF) 100 MCG/2ML IJ SOLN
INTRAMUSCULAR | Status: AC
  Administered 2018-01-25: 25 ug via INTRAVENOUS
  Filled 2018-01-25: qty 2

## 2018-01-25 MED ORDER — DEXAMETHASONE SODIUM PHOSPHATE 10 MG/ML IJ SOLN
INTRAMUSCULAR | Status: AC
  Filled 2018-01-25: qty 1

## 2018-01-25 MED ORDER — FENTANYL CITRATE (PF) 100 MCG/2ML IJ SOLN
INTRAMUSCULAR | Status: AC
  Filled 2018-01-25: qty 2

## 2018-01-25 MED ORDER — LIDOCAINE HCL (PF) 2 % IJ SOLN
INTRAMUSCULAR | Status: AC
  Filled 2018-01-25: qty 10

## 2018-01-25 MED ORDER — ONDANSETRON HCL 4 MG/2ML IJ SOLN
INTRAMUSCULAR | Status: AC
  Filled 2018-01-25: qty 2

## 2018-01-25 MED ORDER — ONDANSETRON HCL 4 MG/2ML IJ SOLN
INTRAMUSCULAR | Status: DC | PRN
  Administered 2018-01-25: 4 mg via INTRAVENOUS

## 2018-01-25 MED ORDER — FENTANYL CITRATE (PF) 100 MCG/2ML IJ SOLN
INTRAMUSCULAR | Status: DC | PRN
  Administered 2018-01-25 (×2): 25 ug via INTRAVENOUS

## 2018-01-25 MED ORDER — HYDROCODONE-ACETAMINOPHEN 5-325 MG PO TABS: 1.0000 | ORAL_TABLET | ORAL | 0 refills | Status: DC | PRN

## 2018-01-25 MED ORDER — CEFAZOLIN SODIUM-DEXTROSE 2-4 GM/100ML-% IV SOLN
INTRAVENOUS | Status: AC
  Filled 2018-01-25: qty 100

## 2018-01-25 MED ORDER — FENTANYL CITRATE (PF) 100 MCG/2ML IJ SOLN
25.0000 ug | INTRAMUSCULAR | Status: DC | PRN
  Administered 2018-01-25 (×3): 25 ug via INTRAVENOUS

## 2018-01-25 SURGICAL SUPPLY — 40 items
BAG DRAIN CYSTO-URO LG1000N (MISCELLANEOUS) ×3 IMPLANT
BAG URINE DRAINAGE (UROLOGICAL SUPPLIES) ×3 IMPLANT
BRUSH SCRUB EZ  4% CHG (MISCELLANEOUS) ×1
BRUSH SCRUB EZ 4% CHG (MISCELLANEOUS) ×2 IMPLANT
CATH FOL 2WAY LX 18X30 (CATHETERS) ×1 IMPLANT
CATH FOLEY 2WAY  5CC 16FR (CATHETERS)
CATH FOLEY 2WAY 5CC 16FR (CATHETERS)
CATH URETL 5X70 OPEN END (CATHETERS) ×3 IMPLANT
CATH URTH 16FR FL 2W BLN LF (CATHETERS) ×2 IMPLANT
CONRAY 43 FOR UROLOGY 50M (MISCELLANEOUS) ×3 IMPLANT
DRAPE UTILITY 15X26 TOWEL STRL (DRAPES) ×3 IMPLANT
DRSG TELFA 4X3 1S NADH ST (GAUZE/BANDAGES/DRESSINGS) ×3 IMPLANT
ELECT LOOP 22F BIPOLAR SML (ELECTROSURGICAL) ×3
ELECT REM PT RETURN 9FT ADLT (ELECTROSURGICAL)
ELECTRODE LOOP 22F BIPOLAR SML (ELECTROSURGICAL) ×2 IMPLANT
ELECTRODE REM PT RTRN 9FT ADLT (ELECTROSURGICAL) IMPLANT
GLOVE BIO SURGEON STRL SZ8 (GLOVE) ×3 IMPLANT
GLOVE BIOGEL PI IND STRL 8 (GLOVE) ×2 IMPLANT
GLOVE BIOGEL PI INDICATOR 8 (GLOVE)
GOWN STANDARD XL  REUSABL (MISCELLANEOUS) ×3 IMPLANT
GOWN STRL REUS W/ TWL LRG LVL3 (GOWN DISPOSABLE) ×2 IMPLANT
GOWN STRL REUS W/TWL LRG LVL3 (GOWN DISPOSABLE) ×3
GOWN STRL REUS W/TWL XL LVL3 (GOWN DISPOSABLE) ×3 IMPLANT
GOWN STRL REUS W/TWL XL LVL4 (GOWN DISPOSABLE) ×3 IMPLANT
KIT TURNOVER CYSTO (KITS) ×3 IMPLANT
LOOP CUT BIPOLAR 24F LRG (ELECTROSURGICAL) IMPLANT
PACK CYSTO AR (MISCELLANEOUS) ×3 IMPLANT
SENSORWIRE 0.038 NOT ANGLED (WIRE) ×3
SET CYSTO W/LG BORE CLAMP LF (SET/KITS/TRAYS/PACK) ×2 IMPLANT
SET IRRIG Y TYPE TUR BLADDER L (SET/KITS/TRAYS/PACK) ×3 IMPLANT
SOL .9 NS 3000ML IRR  AL (IV SOLUTION) ×6
SOL .9 NS 3000ML IRR AL (IV SOLUTION) ×12
SOL .9 NS 3000ML IRR UROMATIC (IV SOLUTION) ×2 IMPLANT
STENT URET 6FRX24 CONTOUR (STENTS) IMPLANT
STENT URET 6FRX26 CONTOUR (STENTS) IMPLANT
STENT URO INLAY 6FRX24CM (STENTS) ×2 IMPLANT
SURGILUBE 2OZ TUBE FLIPTOP (MISCELLANEOUS) ×3 IMPLANT
SYRINGE IRR TOOMEY STRL 70CC (SYRINGE) ×3 IMPLANT
WATER STERILE IRR 1000ML POUR (IV SOLUTION) ×3 IMPLANT
WIRE SENSOR 0.038 NOT ANGLED (WIRE) ×2 IMPLANT

## 2018-01-25 NOTE — Interval H&P Note (Signed)
History and Physical Interval Note:  01/25/2018 9:53 AM  [name]  has presented today for surgery, with the diagnosis of bladder mass  The various methods of treatment have been discussed with the patient and family. After consideration of risks, benefits and other options for treatment, the patient has consented to  Procedure(s): TRANSURETHRAL RESECTION OF BLADDER TUMOR (TURBT) (N/A) CYSTOSCOPY WITH RETROGRADE PYELOGRAM (Bilateral) CYSTOSCOPY WITH STENT PLACEMENT (Right) as a surgical intervention .  The patient's history has been reviewed, patient examined, no change in status, stable for surgery.  I have reviewed the patient's chart and labs.  Questions were answered to the patient's satisfaction.     Richey

## 2018-01-25 NOTE — Transfer of Care (Signed)
Immediate Anesthesia Transfer of Care Note  Patient: MALCOLM QUAST  Procedure(s) Performed: TRANSURETHRAL RESECTION OF BLADDER TUMOR (TURBT) (N/A Bladder) CYSTOSCOPY WITH RETROGRADE PYELOGRAM (Bilateral Ureter) CYSTOSCOPY WITH STENT PLACEMENT (Bilateral Ureter)  Patient Location: PACU  Anesthesia Type:General  Level of Consciousness: drowsy  Airway & Oxygen Therapy: Patient Spontanous Breathing and Patient connected to face mask oxygen  Post-op Assessment: Report given to RN and Post -op Vital signs reviewed and stable  Post vital signs: Reviewed and stable  Last Vitals:  Vitals Value Taken Time  BP 131/56 01/25/2018 11:58 AM  Temp 36.3 C 01/25/2018 11:58 AM  Pulse 51 01/25/2018 12:01 PM  Resp 12 01/25/2018 12:01 PM  SpO2 100 % 01/25/2018 12:01 PM  Vitals shown include unvalidated device data.  Last Pain:  Vitals:   01/25/18 0753  TempSrc: Tympanic  PainSc: 0-No pain         Complications: No apparent anesthesia complications

## 2018-01-25 NOTE — Anesthesia Postprocedure Evaluation (Signed)
Anesthesia Post Note  Patient: Edward Cummings  Procedure(s) Performed: TRANSURETHRAL RESECTION OF BLADDER TUMOR (TURBT) (N/A Bladder) CYSTOSCOPY WITH RETROGRADE PYELOGRAM (Bilateral Ureter) CYSTOSCOPY WITH STENT PLACEMENT (Bilateral Ureter)  Patient location during evaluation: PACU Anesthesia Type: General Level of consciousness: awake and alert and oriented Pain management: pain level controlled Vital Signs Assessment: post-procedure vital signs reviewed and stable Respiratory status: spontaneous breathing Cardiovascular status: blood pressure returned to baseline Anesthetic complications: no     Last Vitals:  Vitals:   01/25/18 1300 01/25/18 1321  BP: 109/65 (!) 144/62  Pulse: 61   Resp: 18   Temp: (!) 36 C   SpO2: 99% 99%    Last Pain:  Vitals:   01/25/18 1300  TempSrc:   PainSc: 2                  Bernese Doffing

## 2018-01-25 NOTE — H&P (Signed)
01/25/2018 9:49 AM   [name] April 13, 1933 [id number]  Referring provider: No referring provider defined for this encounter.    HPI: Edward Cummings is an 84 year old male who was admitted to [facility] in early July 2019 with a weeklong history of weakness.  He was found to have a creatinine of 2.4.  A renal ultrasound was performed which showed a 4 cm posterior wall bladder mass and mild bilateral hydronephrosis.  He refused Foley catheter placement.  He had clinical improvement with fluid resuscitation and his creatinine was 1.75 at the time of discharge.  He denies previous history of urologic problems.  He has had intermittent gross hematuria.  He has a greater than 50-pack-year smoking history.  Due to chronic kidney disease a noncontrast CT of the abdomen pelvis was performed which confirmed a 4 cm bladder mass.  There was mild right hydronephrosis.  He is scheduled for cystoscopy under anesthesia, TURBT, bilateral retrograde pyelograms and possible right ureteral stent placement.   PMH: Past Medical History:  Diagnosis Date  . Anemia   . Chronic kidney disease   . Depression   . Hypertension   . Neuromuscular disorder ([place])    Nerve damage to left face/eye since around 2002.    Surgical History: Past Surgical History:  Procedure Laterality Date  . EYE SURGERY     Cornea transplants bilaterally & cataract surgery.    Home Medications:  amLODipine 10 MG tablet Commonly known as:  NORVASC Take 1 tablet by mouth daily.   aspirin EC 81 MG tablet Take 1 tablet by mouth daily.     docusate sodium 100 MG capsule Commonly known as:  COLACE Take 2 capsules (200 mg total) by mouth 2 (two) times daily as needed for mild constipation.   ferrous sulfate 325 (65 FE) MG tablet Take 325 mg by mouth daily with breakfast.   meloxicam 7.5 MG tablet Commonly known as:  MOBIC Take 1 tablet by mouth daily.   multivitamin capsule Take 1 capsule by mouth daily.      Allergies: No Known Allergies  Family History: Family History  Problem Relation Age of Onset  . Prostate cancer Neg Hx   . Kidney cancer Neg Hx   . Bladder Cancer Neg Hx     Social History:  reports that he has quit smoking. His smokeless tobacco use includes chew. He reports that he drinks about 2.0 standard drinks of alcohol per week. He reports that he does not use drugs.  ROS: No significant change from 12/19/2017  Physical Exam: BP (!) 144/75   Pulse 71   Temp (!) 96.1 F (35.6 C) (Tympanic)   Resp 16   Ht 5' 7.5" (1.715 m)   Wt 63 kg   SpO2 99%   BMI 21.45 kg/m   Constitutional:  Alert and oriented, No acute distress. HEENT: [place] AT, moist mucus membranes.  Trachea midline, no masses. Cardiovascular: No clubbing, cyanosis, or edema.  RRR Respiratory: Normal respiratory effort, no increased work of breathing.  Lungs clear GI: Abdomen is soft, nontender, nondistended, no abdominal masses GU: No CVA tenderness Lymph: No cervical or inguinal lymphadenopathy. Skin: No rashes, bruises or suspicious lesions. Neurologic: Grossly intact, no focal deficits, moving all 4 extremities. Psychiatric: Normal mood and affect.  Laboratory Data: Lab Results  Component Value Date   WBC 5.9 12/13/2017   HGB 10.2 (L) 12/13/2017   HCT 28.9 (L) 12/13/2017   MCV 95.4 12/13/2017   PLT 227 12/13/2017    Lab  Results  Component Value Date   CREATININE 2.34 (H) 01/25/2018    No results found for: PSA  No results found for: TESTOSTERONE  No results found for: HGBA1C  Urinalysis    Component Value Date/Time   COLORURINE AMBER (A) 12/12/2017 1213   APPEARANCEUR Cloudy (A) 12/19/2017 1352   LABSPEC 1.014 12/12/2017 1213   PHURINE 5.0 12/12/2017 1213   GLUCOSEU Negative 12/19/2017 1352   HGBUR LARGE (A) 12/12/2017 1213   BILIRUBINUR Negative 12/19/2017 1352   KETONESUR NEGATIVE 12/12/2017 1213   PROTEINUR 3+ (A) 12/19/2017 1352   PROTEINUR 100 (A) 12/12/2017 1213    NITRITE Negative 12/19/2017 1352   NITRITE NEGATIVE 12/12/2017 1213   LEUKOCYTESUR Trace (A) 12/19/2017 1352    Lab Results  Component Value Date   LABMICR See below: 12/19/2017   WBCUA 11-30 (A) 12/19/2017   RBCUA >30 (H) 12/19/2017   LABEPIT 0-10 12/19/2017   MUCUS Present (A) 12/19/2017   BACTERIA Moderate (A) 12/19/2017    Pertinent Imaging:  Results for orders placed during the hospital encounter of 12/12/17  US RENAL   Narrative CLINICAL DATA:  84 year old male with acute renal injury.  EXAM: RENAL / URINARY TRACT ULTRASOUND COMPLETE  COMPARISON:  No prior renal imaging.  FINDINGS: Right Kidney:  Length: 8.8 centimeters. Cortical echogenicity within normal limits. Mild right hydronephrosis (image 6). The proximal right ureter is mildly dilated on image 7. A small simple 15 millimeter lower pole cyst is noted.  Left Kidney:  Length: 7.8 centimeters. The left kidney is not as well visualized as the right. There is evidence of mild left hydronephrosis (images 22 and 23) similar to the right side.  Bladder:  There is a solid, vascular lobulated echogenic mass at the base of the bladder (image 43), measuring about 3.8 centimeters. See image 46.  IMPRESSION: 1. Lobulated, vascular soft tissue mass at the base of the bladder, estimated at 3.8 cm. 2. Mild bilateral hydronephrosis, likely bilateral UVJ obstruction from #1. 3. Recommend Urology consultation.   Electronically Signed   By: Genevie Ann M.D.   On: 12/12/2017 15:31    Results for orders placed during the hospital encounter of 01/04/18  CT RENAL STONE STUDY   Narrative CLINICAL DATA:  Gross hematuria. Dysuria. Reported history of bladder mass.  EXAM: CT ABDOMEN AND PELVIS WITHOUT CONTRAST  TECHNIQUE: Multidetector CT imaging of the abdomen and pelvis was performed following the standard protocol without IV contrast.  COMPARISON:  12/12/2017 renal sonogram.  FINDINGS: Lower chest: Emphysema  and nonspecific patchy subpleural reticulation at the lung bases. Right coronary atherosclerosis.  Hepatobiliary: Normal liver size. No liver mass. Normal gallbladder with no radiopaque cholelithiasis. No biliary ductal dilatation.  Pancreas: Normal, with no mass or duct dilation.  Spleen: Normal size. No mass.  Adrenals/Urinary Tract: Normal adrenals. There is a polypoid 4.1 x 3.0 x 3.2 cm right posterior bladder wall mass (series 2/image 58), with associated mild right hydroureteronephrosis. No left hydronephrosis. Normal caliber left ureter. No renal or ureteral stones. No bladder stones. Parapelvic renal cysts in the left kidney. Simple lower right renal cysts, largest 1.6 cm. Homogeneous hyperdense 0.9 cm renal cortical lesion in the lateral upper left kidney with density 68 HU (series 2/image 20). No additional contour deforming renal lesions. Bladder is nondistended. Underlying diffuse bladder wall thickening.  Stomach/Bowel: Normal non-distended stomach. Normal caliber small bowel with no small bowel wall thickening. Normal appendix. Normal large bowel with no diverticulosis, large bowel wall thickening or pericolonic fat stranding.  Vascular/Lymphatic: Atherosclerotic nonaneurysmal abdominal aorta. Multiple mildly enlarged aortocaval and left para-aortic nodes. Representative 1.2 cm aortocaval node (series 2/image 27). Representative 1.3 cm left para-aortic node (series 2/image 21).  Reproductive: Mildly enlarged prostate.  Other: No pneumoperitoneum, ascites or focal fluid collection.  Musculoskeletal: No aggressive appearing focal osseous lesions. Moderate lumbar spondylosis.  IMPRESSION: 1. Polypoid 4.1 cm right posterior bladder wall mass with associated mild right hydroureteronephrosis. Primary bladder malignancy is the diagnosis of exclusion. Cystoscopic correlation advised. 2. Retroperitoneal lymphadenopathy involving the aortocaval and left para-aortic  chains, suspicious for metastatic disease. 3. Mildly enlarged prostate with underlying diffuse bladder wall thickening suggestive of chronic bladder outlet obstruction. 4. Hyperdense 0.9 cm upper left renal cortical lesion, indeterminate. MRI (preferred) or CT abdomen without and with IV contrast recommended for further characterization when clinically feasible. 5. Aortic Atherosclerosis (ICD10-I70.0) and Emphysema (ICD10-J43.9).   Electronically Signed   By: Ilona Sorrel M.D.   On: 01/04/2018 16:02     Assessment & Plan:   84 year old male with a large bladder mass suspicious for urothelial carcinoma.  He presents for cystoscopy under anesthesia with TURBT and bilateral retrograde pyelograms with possible right ureteral stent placement.  The procedure has been discussed in detail including potential risks of bleeding, infection, bladder injury as well as potential anesthetic complications.  He indicated all questions were answered and desires to proceed.   Abbie Sons, [place] [address] [place], [place] [address] [phone]

## 2018-01-25 NOTE — OR Nursing (Signed)
Discharge instructions discussed with pt and family. All voice understanding. 

## 2018-01-25 NOTE — Anesthesia Preprocedure Evaluation (Addendum)
Anesthesia Evaluation  Patient identified by MRN, date of birth, ID band Patient awake    Reviewed: Allergy & Precautions, NPO status , Patient's Chart, lab work & pertinent test results  Airway Mallampati: II  TM Distance: >3 FB     Dental  (+) Edentulous Upper, Edentulous Lower   Pulmonary former smoker,    Pulmonary exam normal        Cardiovascular hypertension, Normal cardiovascular exam     Neuro/Psych PSYCHIATRIC DISORDERS Depression  Neuromuscular disease    GI/Hepatic negative GI ROS, Neg liver ROS,   Endo/Other  negative endocrine ROS  Renal/GU Renal InsufficiencyRenal disease     Musculoskeletal negative musculoskeletal ROS (+)   Abdominal Normal abdominal exam  (+)   Peds negative pediatric ROS (+)  Hematology  (+) anemia ,   Anesthesia Other Findings   Reproductive/Obstetrics                            Anesthesia Physical Anesthesia Plan  ASA: III  Anesthesia Plan: General   Post-op Pain Management:    Induction: Intravenous  PONV Risk Score and Plan:   Airway Management Planned: Oral ETT  Additional Equipment:   Intra-op Plan:   Post-operative Plan: Extubation in OR  Informed Consent: I have reviewed the patients History and Physical, chart, labs and discussed the procedure including the risks, benefits and alternatives for the proposed anesthesia with the patient or authorized representative who has indicated his/her understanding and acceptance.   Dental advisory given  Plan Discussed with: CRNA and Surgeon  Anesthesia Plan Comments:         Anesthesia Quick Evaluation

## 2018-01-25 NOTE — Op Note (Signed)
Preoperative diagnosis: 1. Bladder tumor (4 cm) 2. Right hydronephrosis  Postoperative diagnosis:  1. Bladder tumor ( 4cm) 2. Bilateral hydronephrosis  Procedure:  1. Cystoscopy 2. Transurethral resection of bladder tumor (medium) 3. Bilateral retrograde pyelography with interpretation 4. Bilateral ureteral stent placement  Surgeon: Nicki Reaper C. Safiyah Cisney, M.D.  Anesthesia: General  Complications: None  Intraoperative findings:  1. Bladder tumor: High-grade appearing solid/papillary tumor right lateral wall extending to the trigone 2. Retrograde pyelography: Moderate right hydronephrosis and hydroureter to the distal ureter.  Mild left hydronephrosis/hydroureter with a persistent, contrast noted at the distal ureter throughout the entirety of the case  EBL: Minimal  Specimens: 1. Bladder tumor   Indication: Edward Cummings is a patient who was incidentally found to have a bladder tumor during a hospital admission for weakness and acute kidney injury on renal ultrasound with survey views of the bladder.  A contrast study was not performed due to acute kidney injury and a noncontrast CT of the abdomen pelvis showed right hydronephrosis and a large bladder tumor measuring up to 4 cm on the right lateral wall.  He presents for cystoscopy under anesthesia, bilateral retrograde pyelograms and TURBT.  After reviewing the management options for treatment, he elected to proceed with the above surgical procedure(s). We have discussed the potential benefits and risks of the procedure, side effects of the proposed treatment, the likelihood of the patient achieving the goals of the procedure, and any potential problems that might occur during the procedure or recuperation. Informed consent has been obtained.  Description of procedure:  The patient was taken to the operating room and general anesthesia was induced.  The patient was placed in the dorsal lithotomy position, prepped and draped in the  usual sterile fashion, and preoperative antibiotics were administered. A preoperative time-out was performed.   Cystourethroscopy was performed.  The patient's urethra was examined and was normal/ demonstrated moderate bilobar prostatic hypertrophy.  The bladder was then systematically examined in its entirety with findings of a large bladder tumor as described above.  Attention then turned to the left ureteral orifice and a ureteral catheter was used to intubate the ureteral orifice.  Omnipaque contrast was injected through the ureteral catheter and a retrograde pyelogram was performed with findings as dictated above.  The right ureteral orifice could not be identified secondary to tumor.  The cystoscope was removed and a 26 French continuous-flow resectoscope with obturator was passed per urethra.  An Iglesias resectoscope with loop was then placed into the sheath.  The bladder was then re-examined after the resectoscope was placed.  Using loop cautery resection, the entire tumor was resected starting at the most superior aspect and working inferiorly towards the trigone.  The tumor was completely resected down to superficial muscle.  Hemostasis was obtained with cautery.  All tumor was removed via irrigation.    While resecting the portion of the tumor on the right hemitrigone a rush of urine was noted and the ureteral orifice was identified.  A sensor wire was placed through the resectoscope via a laser bridge and advanced into the ureteral orifice.  A 6 French open-ended ureteral catheter was then advanced over the wire and right retrograde pyelogram was performed with findings as described above.   It was elected to place a right ureteral stent and due to persistent hydroureter on the left a ureteral stent on that side in addition.  6 French/24 cm stents were placed through the laser bridge in the usual fashion over a guidewire.  Good positioning was noted proximally and distally.  The entire  bladder mucosa was inspected and hemostasis was noted to be adequate.  The bladder was then emptied and the resectoscope was removed.  An 23 French Foley catheter was placed with return of pink-tinged effluent upon irrigation.  The patient appeared to tolerate the procedure well and after anesthetic reversal was transferred to the PACU in satisfactory condition.   John Giovanni, MD   Taneka Espiritu C. Bernardo Heater,  MD

## 2018-01-25 NOTE — Anesthesia Procedure Notes (Signed)
Procedure Name: Intubation Date/Time: 01/25/2018 10:10 AM Performed by: Johnna Acosta, CRNA Pre-anesthesia Checklist: Patient identified, Emergency Drugs available, Suction available, Patient being monitored and Timeout performed Patient Re-evaluated:Patient Re-evaluated prior to induction Oxygen Delivery Method: Circle system utilized Preoxygenation: Pre-oxygenation with 100% oxygen Induction Type: IV induction Ventilation: Mask ventilation without difficulty and Oral airway inserted - appropriate to patient size Laryngoscope Size: Sabra Heck and 2 Grade View: Grade I Tube type: Oral Tube size: 7.5 mm Number of attempts: 1 Airway Equipment and Method: Stylet Placement Confirmation: ETT inserted through vocal cords under direct vision,  positive ETCO2 and breath sounds checked- equal and bilateral Secured at: 21 cm Tube secured with: Tape Dental Injury: Teeth and Oropharynx as per pre-operative assessment

## 2018-01-25 NOTE — Anesthesia Post-op Follow-up Note (Signed)
Anesthesia QCDR form completed.        

## 2018-01-28 ENCOUNTER — Other Ambulatory Visit: Payer: Self-pay | Admitting: Urology

## 2018-01-28 LAB — SURGICAL PATHOLOGY

## 2018-01-29 ENCOUNTER — Telehealth: Payer: Self-pay | Admitting: Urology

## 2018-01-29 ENCOUNTER — Ambulatory Visit: Payer: Medicare HMO | Admitting: Urology

## 2018-01-29 ENCOUNTER — Ambulatory Visit: Payer: Medicare HMO

## 2018-01-29 DIAGNOSIS — N3289 Other specified disorders of bladder: Secondary | ICD-10-CM

## 2018-01-29 NOTE — Progress Notes (Signed)
Catheter Removal  Patient is present today for a catheter removal.  12ml of water was drained from the balloon. A 16FR foley cath was removed from the bladder no complications were noted . Patient tolerated well.  Preformed by: Cristie Hem, CMA Follow up/ Additional notes: As Scheduled

## 2018-01-29 NOTE — Telephone Encounter (Signed)
Patient had surgery on 01-25-18 and has a foley When does he need to have it removed and he states he is in a lot of pain. Can I bring him in on the nurse schedule to have it removed?

## 2018-02-04 ENCOUNTER — Other Ambulatory Visit: Payer: Self-pay

## 2018-02-04 ENCOUNTER — Encounter: Payer: Self-pay | Admitting: Internal Medicine

## 2018-02-04 ENCOUNTER — Inpatient Hospital Stay: Payer: Medicare HMO | Attending: Internal Medicine | Admitting: Internal Medicine

## 2018-02-04 ENCOUNTER — Inpatient Hospital Stay: Payer: Medicare HMO

## 2018-02-04 VITALS — BP 126/65 | HR 61 | Temp 97.6°F | Resp 20 | Ht 67.5 in | Wt 134.0 lb

## 2018-02-04 DIAGNOSIS — R634 Abnormal weight loss: Secondary | ICD-10-CM | POA: Insufficient documentation

## 2018-02-04 DIAGNOSIS — D631 Anemia in chronic kidney disease: Secondary | ICD-10-CM | POA: Diagnosis not present

## 2018-02-04 DIAGNOSIS — G8929 Other chronic pain: Secondary | ICD-10-CM | POA: Insufficient documentation

## 2018-02-04 DIAGNOSIS — M545 Low back pain: Secondary | ICD-10-CM | POA: Diagnosis not present

## 2018-02-04 DIAGNOSIS — N183 Chronic kidney disease, stage 3 (moderate): Secondary | ICD-10-CM | POA: Diagnosis not present

## 2018-02-04 DIAGNOSIS — D649 Anemia, unspecified: Secondary | ICD-10-CM

## 2018-02-04 DIAGNOSIS — C678 Malignant neoplasm of overlapping sites of bladder: Secondary | ICD-10-CM | POA: Insufficient documentation

## 2018-02-04 DIAGNOSIS — N184 Chronic kidney disease, stage 4 (severe): Secondary | ICD-10-CM | POA: Insufficient documentation

## 2018-02-04 DIAGNOSIS — Z87891 Personal history of nicotine dependence: Secondary | ICD-10-CM | POA: Insufficient documentation

## 2018-02-04 DIAGNOSIS — R599 Enlarged lymph nodes, unspecified: Secondary | ICD-10-CM | POA: Insufficient documentation

## 2018-02-04 DIAGNOSIS — Z8551 Personal history of malignant neoplasm of bladder: Secondary | ICD-10-CM | POA: Insufficient documentation

## 2018-02-04 DIAGNOSIS — R5383 Other fatigue: Secondary | ICD-10-CM | POA: Diagnosis not present

## 2018-02-04 DIAGNOSIS — R319 Hematuria, unspecified: Secondary | ICD-10-CM | POA: Diagnosis not present

## 2018-02-04 LAB — CBC WITH DIFFERENTIAL/PLATELET
BASOS PCT: 1 %
Basophils Absolute: 0 10*3/uL (ref 0–0.1)
EOS ABS: 0.4 10*3/uL (ref 0–0.7)
EOS PCT: 5 %
HCT: 28.6 % — ABNORMAL LOW (ref 40.0–52.0)
Hemoglobin: 9.7 g/dL — ABNORMAL LOW (ref 13.0–18.0)
LYMPHS ABS: 1.7 10*3/uL (ref 1.0–3.6)
Lymphocytes Relative: 21 %
MCH: 32.5 pg (ref 26.0–34.0)
MCHC: 33.8 g/dL (ref 32.0–36.0)
MCV: 96.2 fL (ref 80.0–100.0)
MONOS PCT: 8 %
Monocytes Absolute: 0.7 10*3/uL (ref 0.2–1.0)
Neutro Abs: 5.2 10*3/uL (ref 1.4–6.5)
Neutrophils Relative %: 65 %
PLATELETS: 350 10*3/uL (ref 150–440)
RBC: 2.97 MIL/uL — ABNORMAL LOW (ref 4.40–5.90)
RDW: 12.6 % (ref 11.5–14.5)
WBC: 8 10*3/uL (ref 3.8–10.6)

## 2018-02-04 LAB — COMPREHENSIVE METABOLIC PANEL
ALK PHOS: 82 U/L (ref 38–126)
ALT: 20 U/L (ref 0–44)
AST: 27 U/L (ref 15–41)
Albumin: 3.6 g/dL (ref 3.5–5.0)
Anion gap: 10 (ref 5–15)
BUN: 37 mg/dL — AB (ref 8–23)
CALCIUM: 9.1 mg/dL (ref 8.9–10.3)
CHLORIDE: 109 mmol/L (ref 98–111)
CO2: 21 mmol/L — AB (ref 22–32)
CREATININE: 2.28 mg/dL — AB (ref 0.61–1.24)
GFR calc Af Amer: 29 mL/min — ABNORMAL LOW (ref >60)
GFR, EST NON AFRICAN AMERICAN: 25 mL/min — AB (ref >60)
Glucose, Bld: 100 mg/dL — ABNORMAL HIGH (ref 70–99)
Potassium: 4.5 mmol/L (ref 3.5–5.1)
SODIUM: 140 mmol/L (ref 135–145)
Total Bilirubin: 0.4 mg/dL (ref 0.3–1.2)
Total Protein: 7.3 g/dL (ref 6.5–8.1)

## 2018-02-04 LAB — IRON AND TIBC
IRON: 28 ug/dL — AB (ref 45–182)
Saturation Ratios: 11 % — ABNORMAL LOW (ref 17.9–39.5)
TIBC: 251 ug/dL (ref 250–450)
UIBC: 223 ug/dL

## 2018-02-04 LAB — LACTATE DEHYDROGENASE: LDH: 150 U/L (ref 98–192)

## 2018-02-04 LAB — FERRITIN: FERRITIN: 232 ng/mL (ref 24–336)

## 2018-02-04 NOTE — Progress Notes (Signed)
[facility] CONSULT NOTE  Patient Care Team: [name], MD as PCP - General (Internal Medicine)  CHIEF COMPLAINTS/PURPOSE OF CONSULTATION:  Bladder cancer  #  Oncology History   # AUG 2019-TRANSITIONAL CELL BLADDER CA [~ 4cm tumor] s/p cystoscopy [Dr.Stoiff]  with extensive angiolymphatic invasion; lamina propria present but no involvement.  However unfortunately, approximately 1.5 cm retroperitoneal lymph nodes present-highly suspicious for malignancy.  # CKD stage III [creat 2.5]      Cancer of overlapping sites of bladder ([place])     HISTORY OF PRESENTING ILLNESS:  Edward Cummings 84 y.o.  male with a new diagnosis of bladder cancer is here for initial consultation.  Patient states that he was recently admitted the hospital for acute renal failure-and further imaging noted to have approximately 4 cm bladder tumor/right hydronephrosis.  Patient underwent TURBT; also stenting of his right ureter.  Biopsy came back positive for high-grade transitional cell carcinoma; but negative for lamina propria.  CT imaging showed-approximately 1.3 cm multiple retroperitoneal lymph nodes-concerning for malignancy.  Patient complains of moderate to severe fatigue.  Also planes of 10 to 12 pounds weight loss in the last 1 to 2 months.  He has intermittent blood in urine.  Otherwise currently resolved.  Chronic back pain.  Not any worse.   Review of Systems  Constitutional: Positive for malaise/fatigue and weight loss (10-12 pounds 1-2 months). Negative for chills, diaphoresis and fever.  HENT: Negative for nosebleeds and sore throat.   Eyes: Negative for double vision.  Respiratory: Negative for cough, hemoptysis, sputum production, shortness of breath and wheezing.   Cardiovascular: Negative for chest pain, palpitations, orthopnea and leg swelling.  Gastrointestinal: Negative for abdominal pain, blood in stool, constipation, diarrhea, heartburn, melena, nausea and vomiting.   Genitourinary: Positive for hematuria. Negative for dysuria, frequency and urgency.  Musculoskeletal: Positive for back pain. Negative for joint pain.  Skin: Negative.  Negative for itching and rash.  Neurological: Negative for dizziness, tingling, focal weakness, weakness and headaches.  Endo/Heme/Allergies: Does not bruise/bleed easily.  Psychiatric/Behavioral: Negative for depression. The patient is not nervous/anxious and does not have insomnia.      MEDICAL HISTORY:  Past Medical History:  Diagnosis Date  . Anemia   . Chronic kidney disease   . Depression   . Hypertension   . Neuromuscular disorder ([place])    Nerve damage to left face/eye since around 2002.    SURGICAL HISTORY: Past Surgical History:  Procedure Laterality Date  . CYSTOSCOPY W/ RETROGRADES Bilateral 01/25/2018   Procedure: CYSTOSCOPY WITH RETROGRADE PYELOGRAM;  [name], MD;  [name];  Service: Urology;  Laterality: Bilateral;  . CYSTOSCOPY WITH STENT PLACEMENT Bilateral 01/25/2018   Procedure: CYSTOSCOPY WITH STENT PLACEMENT;  [name], MD;  [name];  Service: Urology;  Laterality: Bilateral;  . EYE SURGERY     Cornea transplants bilaterally & cataract surgery.  . TRANSURETHRAL RESECTION OF BLADDER TUMOR N/A 01/25/2018   Procedure: TRANSURETHRAL RESECTION OF BLADDER TUMOR (TURBT);  [name], MD;  [name];  Service: Urology;  [name];    SOCIAL HISTORY: lives in [place]; with wife; quit smoking 18 years ago; beer every 2 months or so.mechanic/retd.   Social History   Socioeconomic History  . Marital status: Married    Spouse name: Not on file  . Number of children: Not on file  . Years of education: Not on file  . Highest education level: Not on file  Occupational History  . Not on file  Social Needs  . Financial resource strain: Not hard at all  . Food insecurity:    Worry: Never true    Inability: Never  true  . Transportation needs:    Medical: Not on file    Non-medical: Not on file  Tobacco Use  . Smoking status: Former Research scientist (life sciences)  . Smokeless tobacco: Current User    Types: Chew  . Tobacco comment: Stopped approximately 10 years ago.  Substance and Sexual Activity  . Alcohol use: Yes    Alcohol/week: 2.0 standard drinks    Types: 2 Cans of beer per week    Comment: Daily  . Drug use: Never  . Sexual activity: Not on file  Lifestyle  . Physical activity:    Days per week: Not on file    Minutes per session: Not on file  . Stress: Only a little  Relationships  . Social connections:    Talks on phone: Not on file    Gets together: Not on file    Attends religious service: Not on file    Active member of club or organization: Not on file    Attends meetings of clubs or organizations: Not on file    Relationship status: Not on file  . Intimate partner violence:    Fear of current or ex partner: Not on file    Emotionally abused: Not on file    Physically abused: Not on file    Forced sexual activity: Not on file  Other Topics Concern  . Not on file  Social History Narrative  . Not on file    FAMILY HISTORY: Family History  Problem Relation Age of Onset  . Prostate cancer Neg Hx   . Kidney cancer Neg Hx   . Bladder Cancer Neg Hx     ALLERGIES:  has No Known Allergies.  MEDICATIONS:  Current Outpatient Medications  Medication Sig Dispense Refill  . amLODipine (NORVASC) 10 MG tablet Take 10 mg by mouth daily.     . diphenhydrAMINE (BENADRYL) 25 MG tablet Take 25 mg by mouth daily as needed (ALLERGIES).    Marland Kitchen docusate sodium (COLACE) 100 MG capsule Take 2 capsules (200 mg total) by mouth 2 (two) times daily as needed for mild constipation. 10 capsule 0  . HYDROcodone-acetaminophen (NORCO/VICODIN) 5-325 MG tablet Take 1 tablet by mouth every 4 (four) hours as needed for moderate pain. 20 tablet 0  . oxybutynin (DITROPAN) 5 MG tablet 1 tab tid prn frequency,urgency,  bladder spasm 15 tablet 0  . prednisoLONE acetate (PRED FORTE) 1 % ophthalmic suspension Place 1 drop into both eyes daily.    . tamsulosin (FLOMAX) 0.4 MG CAPS capsule Take 0.4 mg by mouth daily.    Marland Kitchen aspirin EC 81 MG tablet Take 81 mg by mouth daily.     . Multiple Vitamin (MULTIVITAMIN WITH MINERALS) TABS tablet Take 1 tablet by mouth daily. One-A-Day     No current facility-administered medications for this visit.       Marland Kitchen  PHYSICAL EXAMINATION: ECOG PERFORMANCE STATUS: 1 - Symptomatic but completely ambulatory  Vitals:   02/04/18 1445  BP: 126/65  Pulse: 61  Resp: 20  Temp: 97.6 F (36.4 C)   Filed Weights   02/04/18 1455  Weight: 134 lb (60.8 kg)    Physical Exam  Constitutional: He is oriented to person, place, and time and well-developed, well-nourished, and in no distress.  Accompanied by 2 sons and wife.  Walking himself.  HENT:  Head: Normocephalic and atraumatic.  Mouth/Throat: Oropharynx is clear and moist. No oropharyngeal exudate.  Eyes: Pupils are equal, round, and reactive to light.  Neck: Normal range of motion. Neck supple.  Cardiovascular: Normal rate and regular rhythm.  Pulmonary/Chest: No respiratory distress. He has no wheezes.  Abdominal: Soft. Bowel sounds are normal. He exhibits no distension and no mass. There is no tenderness. There is no rebound and no guarding.  Musculoskeletal: Normal range of motion. He exhibits no edema or tenderness.  Neurological: He is alert and oriented to person, place, and time.  Skin: Skin is warm.  Psychiatric: Affect normal.     LABORATORY DATA:  I have reviewed the data as listed Lab Results  Component Value Date   WBC 8.0 02/04/2018   HGB 9.7 (L) 02/04/2018   HCT 28.6 (L) 02/04/2018   MCV 96.2 02/04/2018   PLT 350 02/04/2018   Recent Labs    12/14/17 0848 12/19/17 1450 01/17/18 0943 01/25/18 0813  NA 143  --  143 141  K 4.9  --  5.5* 4.2  CL 112*  --  113* 111  CO2 23  --  25 23  GLUCOSE  133*  --  96 105*  BUN 22  --  33* 31*  CREATININE 1.79* 2.37* 2.20* 2.34*  CALCIUM 9.0  --  9.4 9.2  GFRNONAA 33* 24* 26* 24*  GFRAA 39* 28* 30* 28*    RADIOGRAPHIC STUDIES: I have personally reviewed the radiological images as listed and agreed with the findings in the report. No results found.  ASSESSMENT & PLAN:   Cancer of overlapping sites of bladder ([place]) #High-grade transitional cell carcinoma; with extensive angiolymphatic invasion; lamina propria present but no involvement.  However unfortunately, approximately 1.5 cm retroperitoneal lymph nodes present-highly suspicious for malignancy.  #A long discussion the patient and family regarding the importance of biopsy of the retroperitoneal lymph nodes-as this would change treatment plan/prognosis.  Discussed that if lymph node biopsy is positive for malignancy-recommend immunotherapy.  However if it is negative-would recommend imaging like PET scan.  Again if all the work-up is negative for any advanced malignancy-then intravesical therapy could be recommended.  However await on above biopsy at this time.  This was discussed at the tumor conference on August 22.  #Extreme fatigue -anemia /CKD -recommend checking lab work; CBC CMP LDH iron studies ferritin myeloma panel.  Also check PT PTT for upcoming biopsy.  #Follow-up with me few days after the retroperitoneal lymph node biopsy; possible IV iron/Venofer.  # I reviewed the blood work- with the patient in detail; also reviewed the imaging independently [as summarized above]; and with the patient in detail.   # Thank you Drs. Masoud/Stoifffor allowing me to participate in the care of your pleasant patient. Please do not hesitate to contact me with questions or concerns in the interim.   All questions were answered. The patient knows to call the clinic with any problems, questions or concerns.    Cammie Sickle, MD 02/04/2018 4:27 PM

## 2018-02-04 NOTE — Assessment & Plan Note (Addendum)
#  High-grade transitional cell carcinoma; with extensive angiolymphatic invasion; lamina propria present but no involvement.  However unfortunately, approximately 1.5 cm retroperitoneal lymph nodes present-highly suspicious for malignancy.  #A long discussion the patient and family regarding the importance of biopsy of the retroperitoneal lymph nodes-as this would change treatment plan/prognosis.  Discussed that if lymph node biopsy is positive for malignancy-recommend immunotherapy.  However if it is negative-would recommend imaging like PET scan.  Again if all the work-up is negative for any advanced malignancy-then intravesical therapy could be recommended.  However await on above biopsy at this time.  This was discussed at the tumor conference on August 22.  #Extreme fatigue -anemia /CKD -recommend checking lab work; CBC CMP LDH iron studies ferritin myeloma panel.  Also check PT PTT for upcoming biopsy.  #Follow-up with me few days after the retroperitoneal lymph node biopsy; possible IV iron/Venofer.  # I reviewed the blood work- with the patient in detail; also reviewed the imaging independently [as summarized above]; and with the patient in detail.   # Thank you Drs. Masoud/Stoifffor allowing me to participate in the care of your pleasant patient. Please do not hesitate to contact me with questions or concerns in the interim.

## 2018-02-05 DIAGNOSIS — R12 Heartburn: Secondary | ICD-10-CM | POA: Diagnosis not present

## 2018-02-05 DIAGNOSIS — Z0000 Encounter for general adult medical examination without abnormal findings: Secondary | ICD-10-CM | POA: Diagnosis not present

## 2018-02-05 DIAGNOSIS — R0602 Shortness of breath: Secondary | ICD-10-CM | POA: Diagnosis not present

## 2018-02-05 DIAGNOSIS — C679 Malignant neoplasm of bladder, unspecified: Secondary | ICD-10-CM | POA: Diagnosis not present

## 2018-02-05 DIAGNOSIS — I10 Essential (primary) hypertension: Secondary | ICD-10-CM | POA: Diagnosis not present

## 2018-02-05 LAB — MULTIPLE MYELOMA PANEL, SERUM
ALBUMIN/GLOB SERPL: 1.1 (ref 0.7–1.7)
ALPHA2 GLOB SERPL ELPH-MCNC: 0.9 g/dL (ref 0.4–1.0)
Albumin SerPl Elph-Mcnc: 3.3 g/dL (ref 2.9–4.4)
Alpha 1: 0.3 g/dL (ref 0.0–0.4)
B-GLOBULIN SERPL ELPH-MCNC: 1 g/dL (ref 0.7–1.3)
GAMMA GLOB SERPL ELPH-MCNC: 1 g/dL (ref 0.4–1.8)
GLOBULIN, TOTAL: 3.3 g/dL (ref 2.2–3.9)
IGG (IMMUNOGLOBIN G), SERUM: 1164 mg/dL (ref 700–1600)
IgA: 267 mg/dL (ref 61–437)
IgM (Immunoglobulin M), Srm: 43 mg/dL (ref 15–143)
TOTAL PROTEIN ELP: 6.6 g/dL (ref 6.0–8.5)

## 2018-02-05 LAB — KAPPA/LAMBDA LIGHT CHAINS
Kappa free light chain: 82.2 mg/L — ABNORMAL HIGH (ref 3.3–19.4)
Kappa, lambda light chain ratio: 1.39 (ref 0.26–1.65)
Lambda free light chains: 59.1 mg/L — ABNORMAL HIGH (ref 5.7–26.3)

## 2018-02-14 ENCOUNTER — Other Ambulatory Visit: Payer: Self-pay | Admitting: Radiology

## 2018-02-14 ENCOUNTER — Telehealth: Payer: Self-pay | Admitting: *Deleted

## 2018-02-14 ENCOUNTER — Telehealth: Payer: Self-pay | Admitting: Internal Medicine

## 2018-02-14 DIAGNOSIS — C678 Malignant neoplasm of overlapping sites of bladder: Secondary | ICD-10-CM

## 2018-02-14 DIAGNOSIS — N183 Chronic kidney disease, stage 3 unspecified: Secondary | ICD-10-CM

## 2018-02-14 DIAGNOSIS — D649 Anemia, unspecified: Secondary | ICD-10-CM

## 2018-02-14 DIAGNOSIS — D631 Anemia in chronic kidney disease: Secondary | ICD-10-CM

## 2018-02-14 DIAGNOSIS — C679 Malignant neoplasm of bladder, unspecified: Secondary | ICD-10-CM

## 2018-02-14 NOTE — Telephone Encounter (Signed)
Per Pamala Hurry in scheduling, patient's bx can be added tomorrow on 02/15/18 at 9 am. Patient's apt with symptom mgmt - moved to pm tomorrow due to apts in am. Wife aware of plan of care. I spoke with wife and provided bx instructions. Patient will need to be npo after midnight. Wife gave verbal understanding. Wife states that patient has decreased lower extremity strength. She feels for this reason, he is declining.

## 2018-02-14 NOTE — Telephone Encounter (Signed)
Spoke to IR scheduling; we have fax confirmation of request for 8/26; as per IR did not receive the request.   Faxed request again; will set up asap.   Given pt "declining" recommend eval in [facility].

## 2018-02-14 NOTE — Telephone Encounter (Signed)
[name] called to report that the biopsy for patient has not been scheduled and his condition is deteriorating quickly. Please advise  #A long discussion the patient and family regarding the importance of biopsy of the retroperitoneal lymph nodes-as this would change treatment plan/prognosis.  Discussed that if lymph node biopsy is positive for malignancy-recommend immunotherapy.  However if it is negative-would recommend imaging like PET scan.  Again if all the work-up is negative for any advanced malignancy-then intravesical therapy could be recommended.  However await on above biopsy at this time.  This was discussed at the tumor conference on August 22. #Follow-up with me few days after the retroperitoneal lymph node biopsy; possible IV iron/Venofer.

## 2018-02-14 NOTE — Telephone Encounter (Signed)
Appointment accepted for tomorrow at 845 for labs followed by [facility] labs entered in computer

## 2018-02-14 NOTE — Telephone Encounter (Signed)
Per md - please have NP see patient tomorrow - cbc, metc, hold tube.  We are inquiring about the patient's biopsy information. This information was previously faxed on 8/26. Per radiology scheduling, this procedure was not scheduled.  Will follow-up with radiology.

## 2018-02-15 ENCOUNTER — Inpatient Hospital Stay: Payer: Medicare HMO | Attending: Internal Medicine

## 2018-02-15 ENCOUNTER — Inpatient Hospital Stay: Payer: Medicare HMO | Admitting: Nurse Practitioner

## 2018-02-15 ENCOUNTER — Inpatient Hospital Stay (HOSPITAL_BASED_OUTPATIENT_CLINIC_OR_DEPARTMENT_OTHER): Payer: Medicare HMO | Admitting: Nurse Practitioner

## 2018-02-15 ENCOUNTER — Other Ambulatory Visit: Payer: Self-pay

## 2018-02-15 ENCOUNTER — Encounter: Payer: Medicare HMO | Admitting: Nurse Practitioner

## 2018-02-15 ENCOUNTER — Other Ambulatory Visit: Payer: Medicare HMO

## 2018-02-15 ENCOUNTER — Encounter: Payer: Self-pay | Admitting: Nurse Practitioner

## 2018-02-15 ENCOUNTER — Telehealth: Payer: Self-pay | Admitting: Internal Medicine

## 2018-02-15 ENCOUNTER — Ambulatory Visit
Admission: RE | Admit: 2018-02-15 | Discharge: 2018-02-15 | Disposition: A | Discharge status: Home / Self Care | Payer: Medicare HMO | Source: Ambulatory Visit | Attending: Internal Medicine | Admitting: Internal Medicine

## 2018-02-15 VITALS — BP 121/76 | HR 72 | Temp 97.7°F | Resp 20 | Ht 68.0 in | Wt 120.0 lb

## 2018-02-15 DIAGNOSIS — C678 Malignant neoplasm of overlapping sites of bladder: Secondary | ICD-10-CM | POA: Diagnosis not present

## 2018-02-15 DIAGNOSIS — M545 Low back pain: Secondary | ICD-10-CM | POA: Insufficient documentation

## 2018-02-15 DIAGNOSIS — Z79899 Other long term (current) drug therapy: Secondary | ICD-10-CM | POA: Diagnosis not present

## 2018-02-15 DIAGNOSIS — Z87891 Personal history of nicotine dependence: Secondary | ICD-10-CM | POA: Insufficient documentation

## 2018-02-15 DIAGNOSIS — C679 Malignant neoplasm of bladder, unspecified: Secondary | ICD-10-CM

## 2018-02-15 DIAGNOSIS — D649 Anemia, unspecified: Secondary | ICD-10-CM | POA: Insufficient documentation

## 2018-02-15 DIAGNOSIS — R5383 Other fatigue: Secondary | ICD-10-CM | POA: Insufficient documentation

## 2018-02-15 DIAGNOSIS — I129 Hypertensive chronic kidney disease with stage 1 through stage 4 chronic kidney disease, or unspecified chronic kidney disease: Secondary | ICD-10-CM | POA: Insufficient documentation

## 2018-02-15 DIAGNOSIS — F329 Major depressive disorder, single episode, unspecified: Secondary | ICD-10-CM | POA: Insufficient documentation

## 2018-02-15 DIAGNOSIS — R634 Abnormal weight loss: Secondary | ICD-10-CM | POA: Diagnosis not present

## 2018-02-15 DIAGNOSIS — R590 Localized enlarged lymph nodes: Secondary | ICD-10-CM | POA: Diagnosis not present

## 2018-02-15 DIAGNOSIS — N184 Chronic kidney disease, stage 4 (severe): Secondary | ICD-10-CM | POA: Diagnosis not present

## 2018-02-15 DIAGNOSIS — G709 Myoneural disorder, unspecified: Secondary | ICD-10-CM | POA: Diagnosis not present

## 2018-02-15 DIAGNOSIS — Z5112 Encounter for antineoplastic immunotherapy: Secondary | ICD-10-CM | POA: Insufficient documentation

## 2018-02-15 DIAGNOSIS — N189 Chronic kidney disease, unspecified: Secondary | ICD-10-CM | POA: Insufficient documentation

## 2018-02-15 DIAGNOSIS — D631 Anemia in chronic kidney disease: Secondary | ICD-10-CM

## 2018-02-15 DIAGNOSIS — R630 Anorexia: Secondary | ICD-10-CM | POA: Insufficient documentation

## 2018-02-15 DIAGNOSIS — G8929 Other chronic pain: Secondary | ICD-10-CM | POA: Insufficient documentation

## 2018-02-15 DIAGNOSIS — N183 Chronic kidney disease, stage 3 unspecified: Secondary | ICD-10-CM

## 2018-02-15 DIAGNOSIS — Z7982 Long term (current) use of aspirin: Secondary | ICD-10-CM | POA: Diagnosis not present

## 2018-02-15 DIAGNOSIS — N329 Bladder disorder, unspecified: Secondary | ICD-10-CM | POA: Diagnosis not present

## 2018-02-15 LAB — COMPREHENSIVE METABOLIC PANEL
ALBUMIN: 3.9 g/dL (ref 3.5–5.0)
ALBUMIN: 3.6 g/dL (ref 3.5–5.0)
ALT: 12 U/L (ref 0–44)
ALT: 11 U/L (ref 0–44)
ANION GAP: 10 (ref 5–15)
AST: 23 U/L (ref 15–41)
AST: 19 U/L (ref 15–41)
Alkaline Phosphatase: 93 U/L (ref 38–126)
Alkaline Phosphatase: 91 U/L (ref 38–126)
Anion gap: 7 (ref 5–15)
BUN: 34 mg/dL — ABNORMAL HIGH (ref 8–23)
BUN: 36 mg/dL — ABNORMAL HIGH (ref 8–23)
CHLORIDE: 109 mmol/L (ref 98–111)
CHLORIDE: 111 mmol/L (ref 98–111)
CO2: 23 mmol/L (ref 22–32)
CO2: 23 mmol/L (ref 22–32)
CREATININE: 2.58 mg/dL — AB (ref 0.61–1.24)
Calcium: 9.1 mg/dL (ref 8.9–10.3)
Calcium: 9 mg/dL (ref 8.9–10.3)
Creatinine, Ser: 2.61 mg/dL — ABNORMAL HIGH (ref 0.61–1.24)
GFR calc Af Amer: 25 mL/min — ABNORMAL LOW (ref >60)
GFR calc non Af Amer: 21 mL/min — ABNORMAL LOW (ref >60)
GFR, EST AFRICAN AMERICAN: 25 mL/min — AB (ref >60)
GFR, EST NON AFRICAN AMERICAN: 21 mL/min — AB (ref >60)
GLUCOSE: 96 mg/dL (ref 70–99)
GLUCOSE: 120 mg/dL — AB (ref 70–99)
POTASSIUM: 3.8 mmol/L (ref 3.5–5.1)
Potassium: 4.4 mmol/L (ref 3.5–5.1)
SODIUM: 142 mmol/L (ref 135–145)
SODIUM: 141 mmol/L (ref 135–145)
Total Bilirubin: 0.7 mg/dL (ref 0.3–1.2)
Total Bilirubin: 0.7 mg/dL (ref 0.3–1.2)
Total Protein: 7.4 g/dL (ref 6.5–8.1)
Total Protein: 7.1 g/dL (ref 6.5–8.1)

## 2018-02-15 LAB — SAMPLE TO BLOOD BANK

## 2018-02-15 LAB — CBC WITH DIFFERENTIAL/PLATELET
BASOS PCT: 1 %
Basophils Absolute: 0.1 10*3/uL (ref 0–0.1)
Basophils Absolute: 0 10*3/uL (ref 0–0.1)
Basophils Relative: 1 %
EOS ABS: 0.2 10*3/uL (ref 0–0.7)
EOS PCT: 3 %
Eosinophils Absolute: 0.3 10*3/uL (ref 0–0.7)
Eosinophils Relative: 5 %
HCT: 27.2 % — ABNORMAL LOW (ref 40.0–52.0)
HEMATOCRIT: 28.5 % — AB (ref 40.0–52.0)
Hemoglobin: 10 g/dL — ABNORMAL LOW (ref 13.0–18.0)
Hemoglobin: 9.2 g/dL — ABNORMAL LOW (ref 13.0–18.0)
LYMPHS ABS: 2 10*3/uL (ref 1.0–3.6)
LYMPHS ABS: 1.7 10*3/uL (ref 1.0–3.6)
LYMPHS PCT: 28 %
Lymphocytes Relative: 27 %
MCH: 33.2 pg (ref 26.0–34.0)
MCH: 32.1 pg (ref 26.0–34.0)
MCHC: 35.2 g/dL (ref 32.0–36.0)
MCHC: 33.9 g/dL (ref 32.0–36.0)
MCV: 94.3 fL (ref 80.0–100.0)
MCV: 94.7 fL (ref 80.0–100.0)
MONOS PCT: 8 %
MONOS PCT: 10 %
Monocytes Absolute: 0.5 10*3/uL (ref 0.2–1.0)
Monocytes Absolute: 0.6 10*3/uL (ref 0.2–1.0)
NEUTROS ABS: 4.2 10*3/uL (ref 1.4–6.5)
Neutro Abs: 3.7 10*3/uL (ref 1.4–6.5)
Neutrophils Relative %: 58 %
Neutrophils Relative %: 59 %
PLATELETS: 350 10*3/uL (ref 150–440)
Platelets: 401 10*3/uL (ref 150–440)
RBC: 3.02 MIL/uL — ABNORMAL LOW (ref 4.40–5.90)
RBC: 2.88 MIL/uL — ABNORMAL LOW (ref 4.40–5.90)
RDW: 12.8 % (ref 11.5–14.5)
RDW: 12.6 % (ref 11.5–14.5)
WBC: 7.1 10*3/uL (ref 3.8–10.6)
WBC: 6.2 10*3/uL (ref 3.8–10.6)

## 2018-02-15 LAB — TSH: TSH: 1.268 u[IU]/mL (ref 0.350–4.500)

## 2018-02-15 LAB — PROTIME-INR
INR: 1.12
Prothrombin Time: 14.3 seconds (ref 11.4–15.2)

## 2018-02-15 IMAGING — CT CT BIOPSY
2 series · 10 of 14 positions shown, 12 images · non-contrast
Comparison: none

INDICATION: Bladder mass, retroperitoneal adenopathy

[Series 2: i-spiral 5.0 b30f · axial · 0.61mm/px · z∈[-220,-114]mm · 7 of 40 slices shown, 9 images]
[im 5/40  soft-tissue]
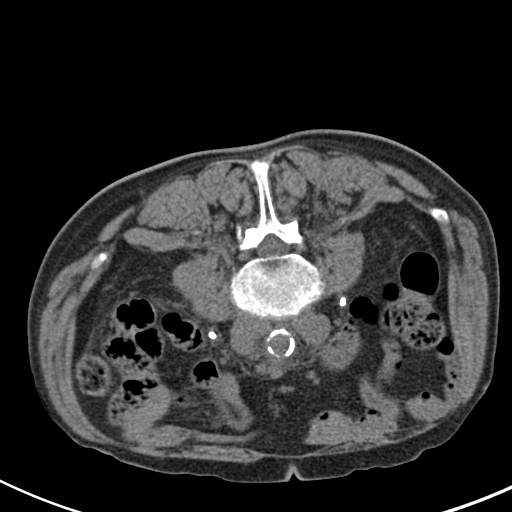
[im 5/40  bone]
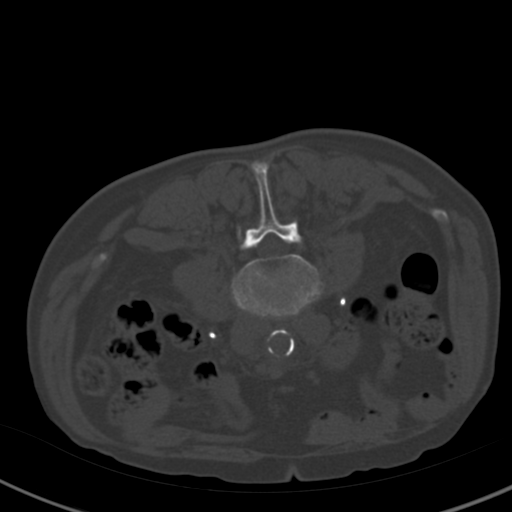
[im 10/40  bone]
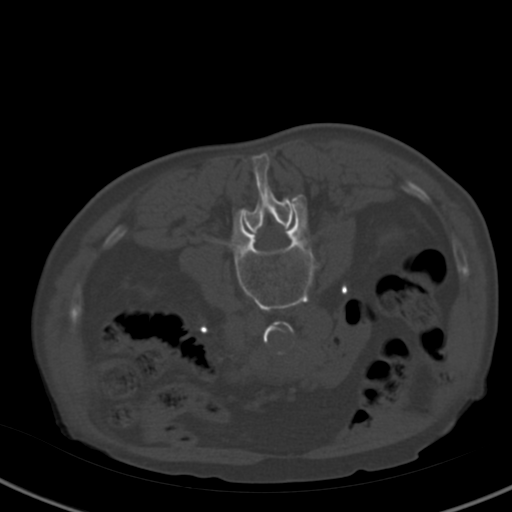
[im 15/40  bone]
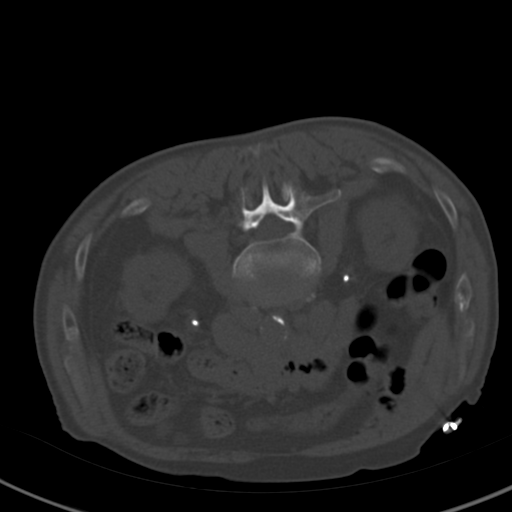
[im 20/40  bone]
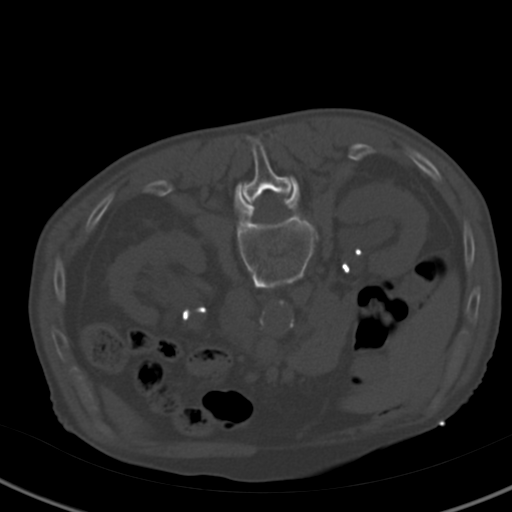
[im 25/40  soft-tissue]
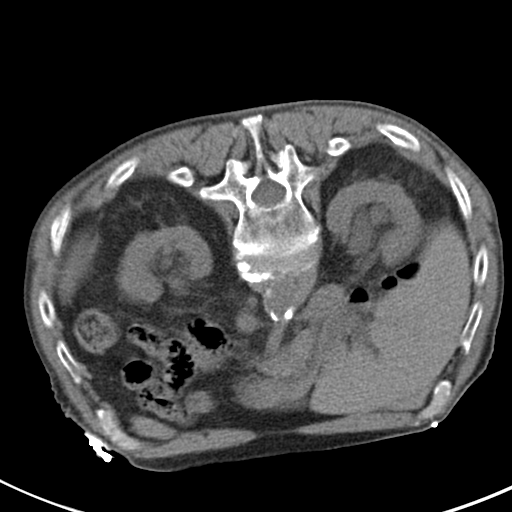
[im 25/40  bone]
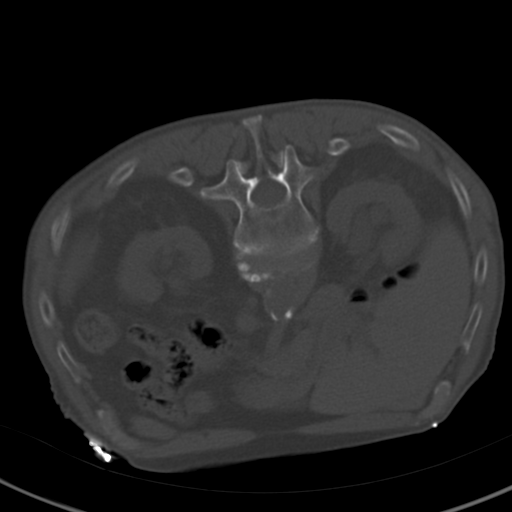
[im 30/40  bone]
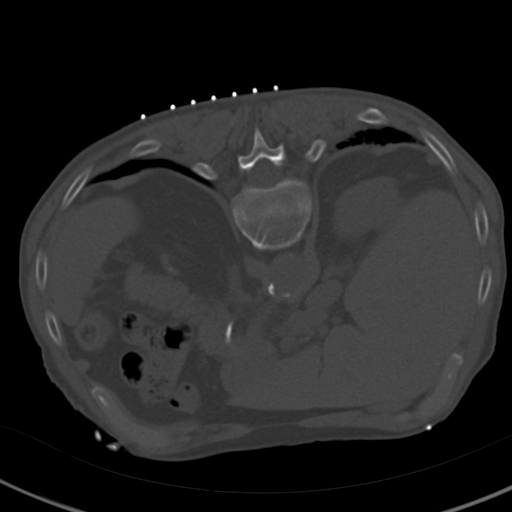
[im 35/40  bone]
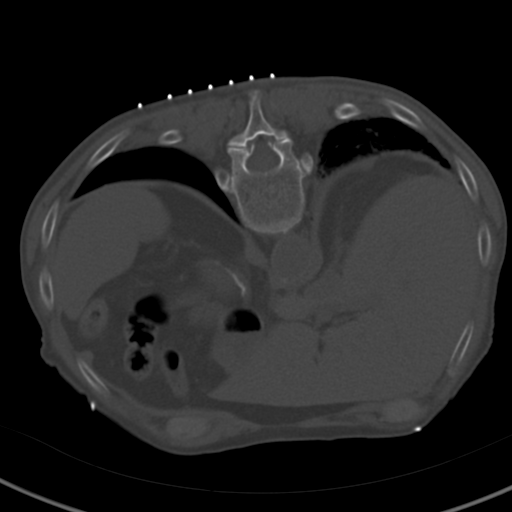

[Series 3: i-sequence 4.8 b30s · axial · 0.61mm/px · z∈[-193,-183]mm · 3 of 21 slices shown]
[im 6/21  bone]
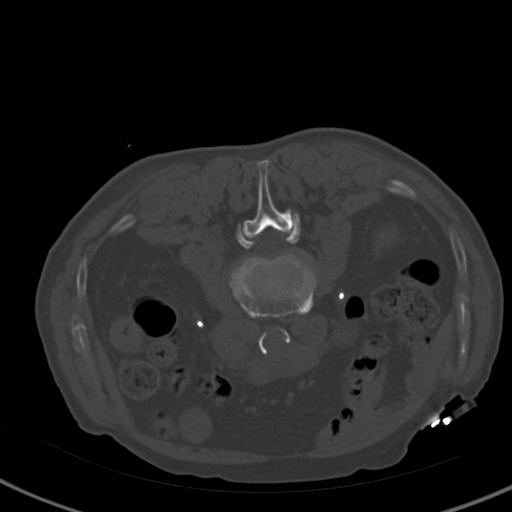
[im 11/21  bone]
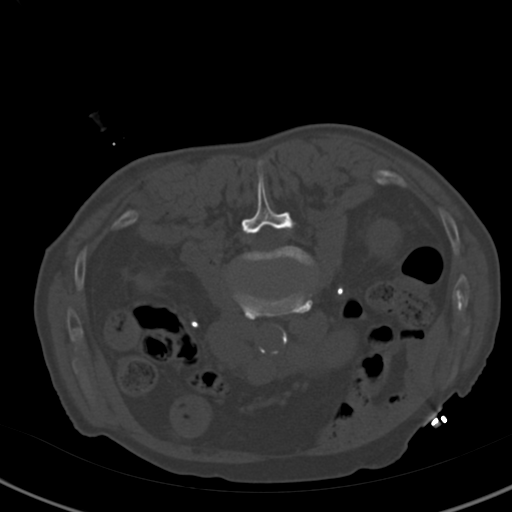
[im 16/21  bone]
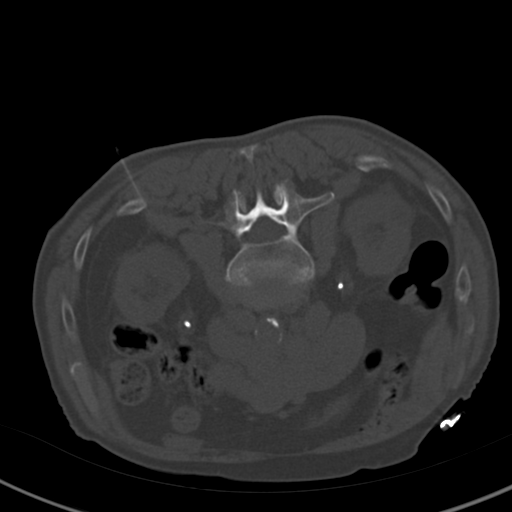

[10 of 14 positions shown; findings below may reference images not displayed]

EXAM:
CT BIOPSY LEFT RETROPERITONEAL ADENOPATHY

MEDICATIONS:
1% lidocaine local

ANESTHESIA/SEDATION:
Moderate (conscious) sedation was employed during this procedure. A
total of Versed 1.5 mg and Fentanyl 75 mcg was administered
intravenously.

Moderate Sedation Time: 11 minutes. The patient's level of
consciousness and vital signs were monitored continuously by
radiology nursing throughout the procedure under my direct
supervision.

FLUOROSCOPY TIME:  Fluoroscopy Time: None.

COMPLICATIONS:
None immediate.

PROCEDURE:
Informed written consent was obtained from the patient after a
thorough discussion of the procedural risks, benefits and
alternatives. All questions were addressed. Maximal Sterile Barrier
Technique was utilized including caps, mask, sterile gowns, sterile
gloves, sterile drape, hand hygiene and skin antiseptic. A timeout
was performed prior to the initiation of the procedure.

Previous imaging reviewed. Patient positioned prone. Noncontrast
localization CT performed. The left retroperitoneal adenopathy was
localized. Overlying skin marked for a left posterior paraspinous
approach. Under CT guidance, a 17 gauge 11.8 cm access needle was
advanced from a posterior approach to the left retroperitoneal
adenopathy. Needle position confirmed with CT. 18 gauge core
biopsies obtained. Samples were intact and non fragmented. These
were placed in formalin. Needle removed. Postprocedure imaging
demonstrates no hemorrhage or hematoma. Patient tolerated the biopsy
well.
IMPRESSION: Successful CT-guided left retroperitoneal adenopathy 18 gauge core
biopsies

## 2018-02-15 MED ORDER — FENTANYL CITRATE (PF) 100 MCG/2ML IJ SOLN
INTRAMUSCULAR | Status: AC | PRN
  Administered 2018-02-15: 50 ug via INTRAVENOUS
  Administered 2018-02-15: 25 ug via INTRAVENOUS

## 2018-02-15 MED ORDER — FENTANYL CITRATE (PF) 100 MCG/2ML IJ SOLN
INTRAMUSCULAR | Status: AC
  Filled 2018-02-15: qty 4

## 2018-02-15 MED ORDER — LIDOCAINE HCL (PF) 1 % IJ SOLN
INTRAMUSCULAR | Status: AC | PRN
  Administered 2018-02-15: 9 mL

## 2018-02-15 MED ORDER — MIDAZOLAM HCL 5 MG/5ML IJ SOLN
INTRAMUSCULAR | Status: AC
  Filled 2018-02-15: qty 5

## 2018-02-15 MED ORDER — SODIUM CHLORIDE 0.9 % IV SOLN
INTRAVENOUS | Status: DC
  Administered 2018-02-15: 09:00:00 via INTRAVENOUS

## 2018-02-15 MED ORDER — MIDAZOLAM HCL 5 MG/5ML IJ SOLN
INTRAMUSCULAR | Status: AC | PRN
  Administered 2018-02-15: 0.5 mg via INTRAVENOUS
  Administered 2018-02-15: 1 mg via INTRAVENOUS

## 2018-02-15 NOTE — Progress Notes (Signed)
Erroneous encounter.   Patient to follow up with Dr. Rogue Bussing on 02/20/18 for discussion of biopsy results. We discussed the role of Symptom Management and services offered. He denies acute issues today and does not feel he needs to be evaluated. Family feels appointment was made by mistake.  Beckey Rutter, NP

## 2018-02-15 NOTE — Procedures (Signed)
Retroperitoneal adenopathy  S/p CT RP NODE CORE BX  No comp Stable EBL MIN Full report in pacs

## 2018-02-15 NOTE — Telephone Encounter (Signed)
#  Patient had a biopsy today; would recommend a follow-up with me on 9/11 at 8:30 to discuss the results of pathology.    patient is an appointment this afternoon with Lauren. dw lauren.

## 2018-02-15 NOTE — Progress Notes (Signed)
Patient has had a better appetite today. Although in previous days, he has had no appetite. Patient denies N&V. Patient reports lower extremity weakness. He has w/c and walker/canes in the home, but does not use these items to ambulate.

## 2018-02-15 NOTE — H&P (Signed)
Chief Complaint: Patient was seen in consultation today for retroperitoneal LN bx at the request of Brahmanday,Govinda R  Referring Physician(s): Cammie Sickle  Supervising Physician: Daryll Brod  Patient Status: [facility] - Out-pt  History of Present Illness: Edward Cummings is a 84 y.o. male referred for retroperitoneal adenopathy biopsy. PMHx, meds, labs, imaging, allergies reviewed. Feels well, no recent fevers, chills, illness. Has been NPO today as directed. Family at bedside.   Past Medical History:  Diagnosis Date  . Anemia   . Chronic kidney disease   . Depression   . Hypertension   . Neuromuscular disorder ([place])    Nerve damage to left face/eye since around 2002.    Past Surgical History:  Procedure Laterality Date  . CYSTOSCOPY W/ RETROGRADES Bilateral 01/25/2018   Procedure: CYSTOSCOPY WITH RETROGRADE PYELOGRAM;  [name], MD;  [name];  Service: Urology;  Laterality: Bilateral;  . CYSTOSCOPY WITH STENT PLACEMENT Bilateral 01/25/2018   Procedure: CYSTOSCOPY WITH STENT PLACEMENT;  [name], MD;  [name];  Service: Urology;  Laterality: Bilateral;  . EYE SURGERY     Cornea transplants bilaterally & cataract surgery.  . TRANSURETHRAL RESECTION OF BLADDER TUMOR N/A 01/25/2018   Procedure: TRANSURETHRAL RESECTION OF BLADDER TUMOR (TURBT);  [name], MD;  [name];  Service: Urology;  [name];    Allergies: Patient has no known allergies.  Medications: Prior to Admission medications   Medication Sig Start Date End Date Taking? Authorizing Provider  amLODipine (NORVASC) 10 MG tablet Take 10 mg by mouth daily.  08/25/13   [provider]  aspirin EC 81 MG tablet Take 81 mg by mouth daily.     [provider]  diphenhydrAMINE (BENADRYL) 25 MG tablet Take 25 mg by mouth daily as needed (ALLERGIES).    [provider]  docusate sodium (COLACE)  100 MG capsule Take 2 capsules (200 mg total) by mouth 2 (two) times daily as needed for mild constipation. 12/14/17   Salary, Avel Peace, MD  HYDROcodone-acetaminophen (NORCO/VICODIN) 5-325 MG tablet Take 1 tablet by mouth every 4 (four) hours as needed for moderate pain. 01/25/18   Stoioff, Ronda Fairly, MD  Multiple Vitamin (MULTIVITAMIN WITH MINERALS) TABS tablet Take 1 tablet by mouth daily. One-A-Day    [provider]  oxybutynin (DITROPAN) 5 MG tablet 1 tab tid prn frequency,urgency, bladder spasm 01/25/18   Stoioff, Ronda Fairly, MD  prednisoLONE acetate (PRED FORTE) 1 % ophthalmic suspension Place 1 drop into both eyes daily.    [provider]  tamsulosin (FLOMAX) 0.4 MG CAPS capsule Take 0.4 mg by mouth daily.    [provider]     Family History  Problem Relation Age of Onset  . Prostate cancer Neg Hx   . Kidney cancer Neg Hx   . Bladder Cancer Neg Hx     Social History   Socioeconomic History  . Marital status: Married    Spouse name: Not on file  . Number of children: Not on file  . Years of education: Not on file  . Highest education level: Not on file  Occupational History  . Not on file  Social Needs  . Financial resource strain: Not hard at all  . Food insecurity:    Worry: Never true    Inability: Never true  . Transportation needs:    Medical: Not on file    Non-medical: Not on file  Tobacco Use  .  Smoking status: Former Research scientist (life sciences)  . Smokeless tobacco: Current User    Types: Chew  . Tobacco comment: Stopped approximately 10 years ago.  Substance and Sexual Activity  . Alcohol use: Yes    Alcohol/week: 2.0 standard drinks    Types: 2 Cans of beer per week    Comment: Daily  . Drug use: Never  . Sexual activity: Not on file  Lifestyle  . Physical activity:    Days per week: Not on file    Minutes per session: Not on file  . Stress: Only a little  Relationships  . Social connections:    Talks on phone: Not on file    Gets together: Not  on file    Attends religious service: Not on file    Active member of club or organization: Not on file    Attends meetings of clubs or organizations: Not on file    Relationship status: Not on file  Other Topics Concern  . Not on file  Social History Narrative  . Not on file     Review of Systems: A 12 point ROS discussed and pertinent positives are indicated in the HPI above.  All other systems are negative.  Review of Systems  Vital Signs: BP (!) 124/56   Pulse 67   Temp 97.7 F (36.5 C) (Oral)   Resp 15   Ht 5\' 8"  (1.727 m)   Wt 54.4 kg   SpO2 100%   BMI 18.25 kg/m   Physical Exam  Constitutional: He is oriented to person, place, and time. He appears well-developed and well-nourished. No distress.  HENT:  Head: Normocephalic.  Mouth/Throat: Oropharynx is clear and moist.  Neck: Normal range of motion. No JVD present. No tracheal deviation present.  Cardiovascular: Normal rate, regular rhythm and normal heart sounds.  Pulmonary/Chest: Effort normal and breath sounds normal. No respiratory distress.  Neurological: He is alert and oriented to person, place, and time.  Skin: Skin is warm and dry.  Psychiatric: He has a normal mood and affect.     Imaging: No results found.  Labs:  CBC: Recent Labs    12/12/17 1214 12/13/17 0453 02/04/18 1556 02/15/18 0838  WBC 6.9 5.9 8.0 7.1  HGB 10.9* 10.2* 9.7* 10.0*  HCT 30.7* 28.9* 28.6* 28.5*  PLT 250 227 350 401    COAGS: Recent Labs    02/15/18 0838  INR 1.12    BMP: Recent Labs    01/17/18 0943 01/25/18 0813 02/04/18 1556 02/15/18 0838  NA 143 141 140 142  K 5.5* 4.2 4.5 3.8  CL 113* 111 109 109  CO2 25 23 21* 23  GLUCOSE 96 105* 100* 96  BUN 33* 31* 37* 34*  CALCIUM 9.4 9.2 9.1 9.1  CREATININE 2.20* 2.34* 2.28* 2.61*  GFRNONAA 26* 24* 25* 21*  GFRAA 30* 28* 29* 25*    LIVER FUNCTION TESTS: Recent Labs    02/04/18 1556 02/15/18 0838  BILITOT 0.4 0.7  AST 27 23  ALT 20 12  ALKPHOS  82 93  PROT 7.3 7.4  ALBUMIN 3.6 3.9    TUMOR MARKERS: No results for input(s): AFPTM, CEA, CA199, CHROMGRNA in the last 8760 hours.  Assessment and Plan: RP adenopathy For CT guided biopsy Labs ok Risks and benefits discussed with the patient including, but not limited to bleeding, infection, damage to adjacent structures or low yield requiring additional tests.  All of the patient's questions were answered, patient is agreeable to proceed. Consent signed and  in chart.    Thank you for this interesting consult.  I greatly enjoyed meeting HARVY RIERA and look forward to participating in their care.  A copy of this report was sent to the requesting provider on this date.  Electronically Signed: Ascencion Dike, PA-C 02/15/2018, 9:12 AM   I spent a total of 20 minutes in face to face in clinical consultation, greater than 50% of which was counseling/coordinating care for LN bx

## 2018-02-18 LAB — SURGICAL PATHOLOGY

## 2018-02-20 ENCOUNTER — Inpatient Hospital Stay (HOSPITAL_BASED_OUTPATIENT_CLINIC_OR_DEPARTMENT_OTHER): Payer: Medicare HMO | Admitting: Internal Medicine

## 2018-02-20 ENCOUNTER — Other Ambulatory Visit: Payer: Self-pay

## 2018-02-20 ENCOUNTER — Inpatient Hospital Stay: Payer: Medicare HMO | Admitting: Internal Medicine

## 2018-02-20 ENCOUNTER — Encounter: Payer: Self-pay | Admitting: Internal Medicine

## 2018-02-20 VITALS — BP 119/69 | HR 73 | Temp 97.6°F | Resp 18 | Ht 68.0 in | Wt 134.0 lb

## 2018-02-20 DIAGNOSIS — D649 Anemia, unspecified: Secondary | ICD-10-CM | POA: Diagnosis not present

## 2018-02-20 DIAGNOSIS — Z5112 Encounter for antineoplastic immunotherapy: Secondary | ICD-10-CM | POA: Diagnosis not present

## 2018-02-20 DIAGNOSIS — R630 Anorexia: Secondary | ICD-10-CM

## 2018-02-20 DIAGNOSIS — G8929 Other chronic pain: Secondary | ICD-10-CM

## 2018-02-20 DIAGNOSIS — R5383 Other fatigue: Secondary | ICD-10-CM

## 2018-02-20 DIAGNOSIS — C678 Malignant neoplasm of overlapping sites of bladder: Secondary | ICD-10-CM

## 2018-02-20 DIAGNOSIS — R634 Abnormal weight loss: Secondary | ICD-10-CM

## 2018-02-20 DIAGNOSIS — N184 Chronic kidney disease, stage 4 (severe): Secondary | ICD-10-CM | POA: Diagnosis not present

## 2018-02-20 DIAGNOSIS — M545 Low back pain: Secondary | ICD-10-CM

## 2018-02-20 DIAGNOSIS — Z79899 Other long term (current) drug therapy: Secondary | ICD-10-CM | POA: Diagnosis not present

## 2018-02-20 DIAGNOSIS — R339 Retention of urine, unspecified: Secondary | ICD-10-CM | POA: Diagnosis not present

## 2018-02-20 NOTE — Progress Notes (Signed)
[name]  Patient Care Team: [name], MD as PCP - General (Internal Medicine)  CHIEF COMPLAINTS/PURPOSE OF CONSULTATION:  Bladder cancer  #  Oncology History   # AUG 2019-TRANSITIONAL CELL BLADDER CA [~ 4cm tumor] s/p cystoscopy [Dr.Stoiff]  with extensive angiolymphatic invasion; [name]  # Mid Sep 2019- Start Key/Tec  # CKD stage III-IV [creat 2.5]  # Molecular testing- pending.   DIAGNOSIS: Bladder ca  STAGE:   IV      ;GOALS: palliative  CURRENT/MOST RECENT THERAPY:        Cancer of overlapping sites of bladder ([place])   02/27/2018 -  Chemotherapy    The patient had atezolizumab (TECENTRIQ) 1,[name], 1,200 mg, Intravenous, Once, 0 of 6 cycles  for chemotherapy treatment.       HISTORY OF PRESENTING ILLNESS: Edward Cummings 84 y.o.  male with a new diagnosis of bladder cancer is here for follow-up/review the results of his lymph node biopsy.  Patient continues to complain of worsening fatigue continues to have poor appetite.  Intermittent hematuria.  He denies any blood in urine currently.  Chronic back pain.  Review of Systems  Constitutional: Positive for malaise/fatigue and weight loss (10-12 pounds 1-2 months). Negative for chills, diaphoresis and fever.  HENT: Negative for nosebleeds and sore throat.   Eyes: Negative for double vision.  Respiratory: Negative for cough, hemoptysis, sputum production, shortness of breath and wheezing.   Cardiovascular: Negative for chest pain, palpitations, orthopnea and leg swelling.  Gastrointestinal: Negative for abdominal pain, blood in stool, constipation, diarrhea, heartburn, melena, nausea and vomiting.  Genitourinary: Positive for hematuria. Negative for dysuria, frequency and urgency.  Musculoskeletal: Positive for back pain. Negative for joint pain.  Skin:  Negative.  Negative for itching and rash.  Neurological: Negative for dizziness, tingling, focal weakness, weakness and headaches.  Endo/Heme/Allergies: Does not bruise/bleed easily.  Psychiatric/Behavioral: Negative for depression. The patient is not nervous/anxious and does not have insomnia.      MEDICAL HISTORY:  Past Medical History:  Diagnosis Date  . Anemia   . Chronic kidney disease   . Depression   . Hypertension   . Neuromuscular disorder ([place])    Nerve damage to left face/eye since around 2002.    SURGICAL HISTORY: Past Surgical History:  Procedure Laterality Date  . CYSTOSCOPY W/ RETROGRADES Bilateral 01/25/2018   Procedure: CYSTOSCOPY WITH RETROGRADE PYELOGRAM;  [name], MD;  [name];  Service: Urology;  Laterality: Bilateral;  . CYSTOSCOPY WITH STENT PLACEMENT Bilateral 01/25/2018   Procedure: CYSTOSCOPY WITH STENT PLACEMENT;  [name], MD;  [name];  Service: Urology;  Laterality: Bilateral;  . EYE SURGERY     Cornea transplants bilaterally & cataract surgery.  . TRANSURETHRAL RESECTION OF BLADDER TUMOR N/A 01/25/2018   Procedure: TRANSURETHRAL RESECTION OF BLADDER TUMOR (TURBT);  [name], MD;  [name];  Service: Urology;  [name];    SOCIAL HISTORY: lives in [place]; with wife; quit smoking 18 years ago; beer every 2 months or so.mechanic/retd.   Social History   Socioeconomic History  . Marital status: Married    Spouse name: Not on file  . Number of children: Not on file  . Years of education: Not on file  . Highest education level: Not on file  Occupational History  .  Not on file  Social Needs  . Financial resource strain: Not hard at all  . Food insecurity:    Worry: Never true    Inability: Never true  . Transportation needs:    Medical: Not on file    Non-medical: Not on file  Tobacco Use  . Smoking status: Former Research scientist (life sciences)  . Smokeless tobacco: Current  User    Types: Chew  . Tobacco comment: Stopped approximately 10 years ago.  Substance and Sexual Activity  . Alcohol use: Yes    Alcohol/week: 2.0 standard drinks    Types: 2 Cans of beer per week    Comment: Daily  . Drug use: Never  . Sexual activity: Not on file  Lifestyle  . Physical activity:    Days per week: Not on file    Minutes per session: Not on file  . Stress: Only a little  Relationships  . Social connections:    Talks on phone: Not on file    Gets together: Not on file    Attends religious service: Not on file    Active member of club or organization: Not on file    Attends meetings of clubs or organizations: Not on file    Relationship status: Not on file  . Intimate partner violence:    Fear of current or ex partner: Not on file    Emotionally abused: Not on file    Physically abused: Not on file    Forced sexual activity: Not on file  Other Topics Concern  . Not on file  Social History Narrative  . Not on file    FAMILY HISTORY: Family History  Problem Relation Age of Onset  . Prostate cancer Neg Hx   . Kidney cancer Neg Hx   . Bladder Cancer Neg Hx     ALLERGIES:  has No Known Allergies.  MEDICATIONS:  Current Outpatient Medications  Medication Sig Dispense Refill  . amLODipine (NORVASC) 10 MG tablet Take 10 mg by mouth daily.     Marland Kitchen aspirin EC 81 MG tablet Take 81 mg by mouth daily.     . diphenhydrAMINE (BENADRYL) 25 MG tablet Take 25 mg by mouth daily as needed (ALLERGIES).    Marland Kitchen docusate sodium (COLACE) 100 MG capsule Take 2 capsules (200 mg total) by mouth 2 (two) times daily as needed for mild constipation. 10 capsule 0  . HYDROcodone-acetaminophen (NORCO/VICODIN) 5-325 MG tablet Take 1 tablet by mouth every 4 (four) hours as needed for moderate pain. 20 tablet 0  . Multiple Vitamin (MULTIVITAMIN WITH MINERALS) TABS tablet Take 1 tablet by mouth daily. One-A-Day    . oxybutynin (DITROPAN) 5 MG tablet 1 tab tid prn frequency,urgency, bladder  spasm 15 tablet 0  . prednisoLONE acetate (PRED FORTE) 1 % ophthalmic suspension Place 1 drop into both eyes daily.    . tamsulosin (FLOMAX) 0.4 MG CAPS capsule Take 0.4 mg by mouth daily.     No current facility-administered medications for this visit.       Marland Kitchen  PHYSICAL EXAMINATION: ECOG PERFORMANCE STATUS: 1 - Symptomatic but completely ambulatory  Vitals:   02/20/18 1136  BP: 119/69  Pulse: 73  Resp: 18  Temp: 97.6 F (36.4 C)   Filed Weights   02/20/18 1136  Weight: 134 lb (60.8 kg)    Physical Exam  Constitutional: He is oriented to person, place, and time and well-developed, well-nourished, and in no distress.  Accompanied by 2 sons and wife.  Walking himself.  HENT:  Head: Normocephalic and atraumatic.  Mouth/Throat: Oropharynx is clear and moist. No oropharyngeal exudate.  Eyes: Pupils are equal, round, and reactive to light.  Chronic drooping of the left eyelid.  Neck: Normal range of motion. Neck supple.  Cardiovascular: Normal rate and regular rhythm.  Pulmonary/Chest: No respiratory distress. He has no wheezes.  Abdominal: Soft. Bowel sounds are normal. He exhibits no distension and no mass. There is no tenderness. There is no rebound and no guarding.  Musculoskeletal: Normal range of motion. He exhibits no edema or tenderness.  Neurological: He is alert and oriented to person, place, and time.  Skin: Skin is warm.  Psychiatric: Affect normal.     LABORATORY DATA:  I have reviewed the data as listed Lab Results  Component Value Date   WBC 6.2 02/15/2018   HGB 9.2 (L) 02/15/2018   HCT 27.2 (L) 02/15/2018   MCV 94.7 02/15/2018   PLT 350 02/15/2018   Recent Labs    02/04/18 1556 02/15/18 0838 02/15/18 1354  NA 140 142 141  K 4.5 3.8 4.4  CL 109 109 111  CO2 21* 23 23  GLUCOSE 100* 96 120*  BUN 37* 34* 36*  CREATININE 2.28* 2.61* 2.58*  CALCIUM 9.1 9.1 9.0  GFRNONAA 25* 21* 21*  GFRAA 29* 25* 25*  PROT 7.3 7.4 7.1  ALBUMIN 3.6 3.9 3.6   AST _0 ALT _1 ALKPHOS 82 93 91  BILITOT 0.4 0.7 0.7    RADIOGRAPHIC STUDIES: I have personally reviewed the radiological images as listed and agreed with the findings in the report. Ct Biopsy  Result Date: 02/15/2018 INDICATION: Bladder mass, retroperitoneal adenopathy EXAM: CT BIOPSY LEFT RETROPERITONEAL ADENOPATHY MEDICATIONS: 1% lidocaine local ANESTHESIA/SEDATION: Moderate (conscious) sedation was employed during this procedure. A total of Versed 1.5 mg and Fentanyl 75 mcg was administered intravenously. Moderate Sedation Time: 11 minutes. The patient's level of consciousness and vital signs were monitored continuously by radiology nursing throughout the procedure under my direct supervision. FLUOROSCOPY TIME:  Fluoroscopy Time: None. COMPLICATIONS: None immediate. PROCEDURE: Informed written consent was obtained from the patient after a thorough discussion of the procedural risks, benefits and alternatives. All questions were addressed. Maximal Sterile Barrier Technique was utilized including caps, mask, sterile gowns, sterile gloves, sterile drape, hand hygiene and skin antiseptic. A timeout was performed prior to the initiation of the procedure. Previous imaging reviewed. Patient positioned prone. Noncontrast localization CT performed. The left retroperitoneal adenopathy was localized. Overlying skin marked for a left posterior paraspinous approach. Under CT guidance, a 17 gauge 11.8 cm access needle was advanced from a posterior approach to the left retroperitoneal adenopathy. Needle position confirmed with CT. 18 gauge core biopsies obtained. Samples were intact and non fragmented. These were placed in formalin. Needle removed. Postprocedure imaging demonstrates no hemorrhage or hematoma. Patient tolerated the biopsy well. IMPRESSION: Successful CT-guided left retroperitoneal adenopathy 18 gauge core biopsies Electronically Signed   By: Jerilynn Mages.  Shick M.D.   On: 02/15/2018 11:25     ASSESSMENT & PLAN:   Cancer of overlapping sites of bladder ([place]) #High-grade transitional cell carcinoma of the bladder metastatic to retroperitoneal lymph node.  Stage IV.   #I reviewed the pathology and stage with the patient and family detail.  Discussed that unfortunately this is incurable; treatments are palliative.   #Recommend immunotherapy as patient is platinum ineligible given stage IV kidney disease/comorbidities frailty. Will check NGS/Omniseq/PDL-1.    I discussed the mechanism of action; The goal  of therapy is palliative; and length of treatments are likely ongoing/based upon the results of the scans. Discussed the potential side effects of immunotherapy including but not limited to diarrhea; skin rash; elevated LFTs/endocrine abnormalities etc.  #Extreme fatigue/hemoglobin 9-anemia /CKD-iron saturation 11%. Recommend IV Venofer  # Recommend follow-up in approximately 1 week/labs/immunotherapy/Venofer.  PET scan ASAP.  # 40 minutes face-to-face with the patient discussing the above plan of care; more than 50% of time spent on prognosis/ natural history; counseling and coordination.   All questions were answered. The patient knows to call the clinic with any problems, questions or concerns.    Cammie Sickle, MD 02/20/2018 12:42 PM

## 2018-02-20 NOTE — Progress Notes (Signed)
START ON PATHWAY REGIMEN - Bladder     A cycle is every 21 days:     Atezolizumab   **Always confirm dose/schedule in your pharmacy ordering system**  Patient Characteristics: Metastatic Disease, First Line, No Prior Neoadjuvant/Adjuvant Therapy, Poor Renal Function (CrCl < 50 mL/min), Unknown PD-L1 Expression AJCC M Category: M1a AJCC N Category: NX AJCC T Category: T1 Current evidence of distant metastases<= Yes AJCC 8 Stage Grouping: IVA Line of Therapy: First Line Prior Neoadjuvant/Adjuvant Therapy<= No Renal Function: Poor Renal Function (CrCl < 50 mL/min) PD-L1 Expression Status: Unknown PD-L1 Expression Intent of Therapy: Non-Curative / Palliative Intent, Discussed with Patient

## 2018-02-20 NOTE — Progress Notes (Deleted)
[facility] CONSULT NOTE  Patient Care Team: [name], MD as PCP - General (Internal Medicine)  CHIEF COMPLAINTS/PURPOSE OF CONSULTATION:  Bladder cancer  #  Oncology History   # AUG 2019-TRANSITIONAL CELL BLADDER CA [~ 4cm tumor] s/p cystoscopy [Dr.Stoiff]  with extensive angiolymphatic invasion; lamina propria present but no involvement.  However unfortunately, approximately 1.5 cm retroperitoneal lymph nodes present-highly suspicious for malignancy.  # CKD stage III [creat 2.5]      Cancer of overlapping sites of bladder ([place])     HISTORY OF PRESENTING ILLNESS:  Edward Cummings 84 y.o.  male with a new diagnosis of bladder cancer is here for initial consultation.  Patient states that he was recently admitted the hospital for acute renal failure-and further imaging noted to have approximately 4 cm bladder tumor/right hydronephrosis.  Patient underwent TURBT; also stenting of his right ureter.  Biopsy came back positive for high-grade transitional cell carcinoma; but negative for lamina propria.  CT imaging showed-approximately 1.3 cm multiple retroperitoneal lymph nodes-concerning for malignancy.  Patient complains of moderate to severe fatigue.  Also planes of 10 to 12 pounds weight loss in the last 1 to 2 months.  He has intermittent blood in urine.  Otherwise currently resolved.  Chronic back pain.  Not any worse.   Review of Systems  Constitutional: Positive for malaise/fatigue and weight loss (10-12 pounds 1-2 months). Negative for chills, diaphoresis and fever.  HENT: Negative for nosebleeds and sore throat.   Eyes: Negative for double vision.  Respiratory: Negative for cough, hemoptysis, sputum production, shortness of breath and wheezing.   Cardiovascular: Negative for chest pain, palpitations, orthopnea and leg swelling.  Gastrointestinal: Negative for abdominal pain, blood in stool, constipation, diarrhea, heartburn, melena, nausea and vomiting.   Genitourinary: Positive for hematuria. Negative for dysuria, frequency and urgency.  Musculoskeletal: Positive for back pain. Negative for joint pain.  Skin: Negative.  Negative for itching and rash.  Neurological: Negative for dizziness, tingling, focal weakness, weakness and headaches.  Endo/Heme/Allergies: Does not bruise/bleed easily.  Psychiatric/Behavioral: Negative for depression. The patient is not nervous/anxious and does not have insomnia.      MEDICAL HISTORY:  Past Medical History:  Diagnosis Date  . Anemia   . Chronic kidney disease   . Depression   . Hypertension   . Neuromuscular disorder ([place])    Nerve damage to left face/eye since around 2002.    SURGICAL HISTORY: Past Surgical History:  Procedure Laterality Date  . CYSTOSCOPY W/ RETROGRADES Bilateral 01/25/2018   Procedure: CYSTOSCOPY WITH RETROGRADE PYELOGRAM;  [name], MD;  [name];  Service: Urology;  Laterality: Bilateral;  . CYSTOSCOPY WITH STENT PLACEMENT Bilateral 01/25/2018   Procedure: CYSTOSCOPY WITH STENT PLACEMENT;  [name], MD;  [name];  Service: Urology;  Laterality: Bilateral;  . EYE SURGERY     Cornea transplants bilaterally & cataract surgery.  . TRANSURETHRAL RESECTION OF BLADDER TUMOR N/A 01/25/2018   Procedure: TRANSURETHRAL RESECTION OF BLADDER TUMOR (TURBT);  [name], MD;  [name];  Service: Urology;  [name];    SOCIAL HISTORY: lives in [place]; with wife; quit smoking 18 years ago; beer every 2 months or so.mechanic/retd.   Social History   Socioeconomic History  . Marital status: Married    Spouse name: Not on file  . Number of children: Not on file  . Years of education: Not on file  . Highest education level: Not on file  Occupational History  . Not on file  Social Needs  . Financial resource strain: Not hard at all  . Food insecurity:    Worry: Never true    Inability: Never  true  . Transportation needs:    Medical: Not on file    Non-medical: Not on file  Tobacco Use  . Smoking status: Former Research scientist (life sciences)  . Smokeless tobacco: Current User    Types: Chew  . Tobacco comment: Stopped approximately 10 years ago.  Substance and Sexual Activity  . Alcohol use: Yes    Alcohol/week: 2.0 standard drinks    Types: 2 Cans of beer per week    Comment: Daily  . Drug use: Never  . Sexual activity: Not on file  Lifestyle  . Physical activity:    Days per week: Not on file    Minutes per session: Not on file  . Stress: Only a little  Relationships  . Social connections:    Talks on phone: Not on file    Gets together: Not on file    Attends religious service: Not on file    Active member of club or organization: Not on file    Attends meetings of clubs or organizations: Not on file    Relationship status: Not on file  . Intimate partner violence:    Fear of current or ex partner: Not on file    Emotionally abused: Not on file    Physically abused: Not on file    Forced sexual activity: Not on file  Other Topics Concern  . Not on file  Social History Narrative  . Not on file    FAMILY HISTORY: Family History  Problem Relation Age of Onset  . Prostate cancer Neg Hx   . Kidney cancer Neg Hx   . Bladder Cancer Neg Hx     ALLERGIES:  has No Known Allergies.  MEDICATIONS:  Current Outpatient Medications  Medication Sig Dispense Refill  . amLODipine (NORVASC) 10 MG tablet Take 10 mg by mouth daily.     Marland Kitchen aspirin EC 81 MG tablet Take 81 mg by mouth daily.     . diphenhydrAMINE (BENADRYL) 25 MG tablet Take 25 mg by mouth daily as needed (ALLERGIES).    Marland Kitchen docusate sodium (COLACE) 100 MG capsule Take 2 capsules (200 mg total) by mouth 2 (two) times daily as needed for mild constipation. 10 capsule 0  . HYDROcodone-acetaminophen (NORCO/VICODIN) 5-325 MG tablet Take 1 tablet by mouth every 4 (four) hours as needed for moderate pain. 20 tablet 0  . Multiple  Vitamin (MULTIVITAMIN WITH MINERALS) TABS tablet Take 1 tablet by mouth daily. One-A-Day    . oxybutynin (DITROPAN) 5 MG tablet 1 tab tid prn frequency,urgency, bladder spasm 15 tablet 0  . prednisoLONE acetate (PRED FORTE) 1 % ophthalmic suspension Place 1 drop into both eyes daily.    . tamsulosin (FLOMAX) 0.4 MG CAPS capsule Take 0.4 mg by mouth daily.     No current facility-administered medications for this visit.       Marland Kitchen  PHYSICAL EXAMINATION: ECOG PERFORMANCE STATUS: 1 - Symptomatic but completely ambulatory  There were no vitals filed for this visit. There were no vitals filed for this visit.  Physical Exam  Constitutional: He is oriented to person, place, and time and well-developed, well-nourished, and in no distress.  Accompanied by 2 sons and wife.  Walking himself.  HENT:  Head: Normocephalic and atraumatic.  Mouth/Throat: Oropharynx is clear and moist. No oropharyngeal exudate.  Eyes: Pupils are equal, round, and reactive to light.  Neck: Normal range of motion. Neck supple.  Cardiovascular: Normal rate and regular rhythm.  Pulmonary/Chest: No respiratory distress. He has no wheezes.  Abdominal: Soft. Bowel sounds are normal. He exhibits no distension and no mass. There is no tenderness. There is no rebound and no guarding.  Musculoskeletal: Normal range of motion. He exhibits no edema or tenderness.  Neurological: He is alert and oriented to person, place, and time.  Skin: Skin is warm.  Psychiatric: Affect normal.     LABORATORY DATA:  I have reviewed the data as listed Lab Results  Component Value Date   WBC 6.2 02/15/2018   HGB 9.2 (L) 02/15/2018   HCT 27.2 (L) 02/15/2018   MCV 94.7 02/15/2018   PLT 350 02/15/2018   Recent Labs    02/04/18 1556 02/15/18 0838 02/15/18 1354  NA 140 142 141  K 4.5 3.8 4.4  CL 109 109 111  CO2 21* 23 23  GLUCOSE 100* 96 120*  BUN 37* 34* 36*  CREATININE 2.28* 2.61* 2.58*  CALCIUM 9.1 9.1 9.0  GFRNONAA 25* 21*  21*  GFRAA 29* 25* 25*  PROT 7.3 7.4 7.1  ALBUMIN 3.6 3.9 3.6  AST 27 23 19   ALT 20 12 11   ALKPHOS 82 93 91  BILITOT 0.4 0.7 0.7    RADIOGRAPHIC STUDIES: I have personally reviewed the radiological images as listed and agreed with the findings in the report. Ct Biopsy  Result Date: 02/15/2018 INDICATION: Bladder mass, retroperitoneal adenopathy EXAM: CT BIOPSY LEFT RETROPERITONEAL ADENOPATHY MEDICATIONS: 1% lidocaine local ANESTHESIA/SEDATION: Moderate (conscious) sedation was employed during this procedure. A total of Versed 1.5 mg and Fentanyl 75 mcg was administered intravenously. Moderate Sedation Time: 11 minutes. The patient's level of consciousness and vital signs were monitored continuously by radiology nursing throughout the procedure under my direct supervision. FLUOROSCOPY TIME:  Fluoroscopy Time: None. COMPLICATIONS: None immediate. PROCEDURE: Informed written consent was obtained from the patient after a thorough discussion of the procedural risks, benefits and alternatives. All questions were addressed. Maximal Sterile Barrier Technique was utilized including caps, mask, sterile gowns, sterile gloves, sterile drape, hand hygiene and skin antiseptic. A timeout was performed prior to the initiation of the procedure. Previous imaging reviewed. Patient positioned prone. Noncontrast localization CT performed. The left retroperitoneal adenopathy was localized. Overlying skin marked for a left posterior paraspinous approach. Under CT guidance, a 17 gauge 11.8 cm access needle was advanced from a posterior approach to the left retroperitoneal adenopathy. Needle position confirmed with CT. 18 gauge core biopsies obtained. Samples were intact and non fragmented. These were placed in formalin. Needle removed. Postprocedure imaging demonstrates no hemorrhage or hematoma. Patient tolerated the biopsy well. IMPRESSION: Successful CT-guided left retroperitoneal adenopathy 18 gauge core biopsies  Electronically Signed   By: Jerilynn Mages.  Shick M.D.   On: 02/15/2018 11:25    ASSESSMENT & PLAN:   No problem-specific Assessment & Plan notes found for this encounter.  All questions were answered. The patient knows to call the clinic with any problems, questions or concerns.    Cammie Sickle, MD 02/20/2018 8:12 AM

## 2018-02-20 NOTE — Assessment & Plan Note (Deleted)
#  High-grade transitional cell carcinoma; with extensive angiolymphatic invasion; lamina propria present but no involvement.  However unfortunately, approximately 1.5 cm retroperitoneal lymph nodes present-highly suspicious for malignancy.  #A long discussion the patient and family regarding the importance of biopsy of the retroperitoneal lymph nodes-as this would change treatment plan/prognosis.  Discussed that if lymph node biopsy is positive for malignancy-recommend immunotherapy.  However if it is negative-would recommend imaging like PET scan.  Again if all the work-up is negative for any advanced malignancy-then intravesical therapy could be recommended.  However await on above biopsy at this time.  This was discussed at the tumor conference on August 22.  #Extreme fatigue -anemia /CKD -recommend checking lab work; CBC CMP LDH iron studies ferritin myeloma panel.  Also check PT PTT for upcoming biopsy.  #Follow-up with me few days after the retroperitoneal lymph node biopsy; possible IV iron/Venofer.  # I reviewed the blood work- with the patient in detail; also reviewed the imaging independently [as summarized above]; and with the patient in detail.   # Thank you Drs. Masoud/Stoifffor allowing me to participate in the care of your pleasant patient. Please do not hesitate to contact me with questions or concerns in the interim.

## 2018-02-20 NOTE — Assessment & Plan Note (Addendum)
#  High-grade transitional cell carcinoma of the bladder metastatic to retroperitoneal lymph node.  Stage IV.   #I reviewed the pathology and stage with the patient and family detail.  Discussed that unfortunately this is incurable; treatments are palliative.   #Recommend immunotherapy as patient is platinum ineligible given stage IV kidney disease/comorbidities frailty. Will check NGS/Omniseq/PDL-1.    I discussed the mechanism of action; The goal of therapy is palliative; and length of treatments are likely ongoing/based upon the results of the scans. Discussed the potential side effects of immunotherapy including but not limited to diarrhea; skin rash; elevated LFTs/endocrine abnormalities etc.  #Extreme fatigue/hemoglobin 9-anemia /CKD-iron saturation 11%. Recommend IV Venofer  # Recommend follow-up in approximately 1 week/labs/immunotherapy/Venofer.  PET scan ASAP.  # 40 minutes face-to-face with the patient discussing the above plan of care; more than 50% of time spent on prognosis/ natural history; counseling and coordination.

## 2018-02-21 ENCOUNTER — Emergency Department
Admission: EM | Admit: 2018-02-21 | Discharge: 2018-02-21 | Disposition: A | Discharge status: Home / Self Care | Payer: Medicare HMO | Attending: Emergency Medicine | Admitting: Emergency Medicine

## 2018-02-21 ENCOUNTER — Other Ambulatory Visit: Payer: Self-pay

## 2018-02-21 DIAGNOSIS — C679 Malignant neoplasm of bladder, unspecified: Secondary | ICD-10-CM | POA: Insufficient documentation

## 2018-02-21 DIAGNOSIS — R339 Retention of urine, unspecified: Secondary | ICD-10-CM

## 2018-02-21 DIAGNOSIS — Z79899 Other long term (current) drug therapy: Secondary | ICD-10-CM | POA: Diagnosis not present

## 2018-02-21 DIAGNOSIS — Z7982 Long term (current) use of aspirin: Secondary | ICD-10-CM | POA: Insufficient documentation

## 2018-02-21 DIAGNOSIS — R110 Nausea: Secondary | ICD-10-CM | POA: Insufficient documentation

## 2018-02-21 DIAGNOSIS — R1030 Lower abdominal pain, unspecified: Secondary | ICD-10-CM | POA: Insufficient documentation

## 2018-02-21 DIAGNOSIS — I129 Hypertensive chronic kidney disease with stage 1 through stage 4 chronic kidney disease, or unspecified chronic kidney disease: Secondary | ICD-10-CM | POA: Insufficient documentation

## 2018-02-21 DIAGNOSIS — N183 Chronic kidney disease, stage 3 (moderate): Secondary | ICD-10-CM | POA: Insufficient documentation

## 2018-02-21 DIAGNOSIS — F17220 Nicotine dependence, chewing tobacco, uncomplicated: Secondary | ICD-10-CM | POA: Diagnosis not present

## 2018-02-21 HISTORY — DX: Malignant (primary) neoplasm, unspecified: C80.1

## 2018-02-21 LAB — URINALYSIS, COMPLETE (UACMP) WITH MICROSCOPIC
BACTERIA UA: NONE SEEN
BILIRUBIN URINE: NEGATIVE
Glucose, UA: NEGATIVE mg/dL
KETONES UR: NEGATIVE mg/dL
Nitrite: NEGATIVE
PROTEIN: 30 mg/dL — AB
Specific Gravity, Urine: 1.006 (ref 1.005–1.030)
pH: 7 (ref 5.0–8.0)

## 2018-02-21 MED ORDER — LIDOCAINE HCL URETHRAL/MUCOSAL 2 % EX GEL: 1.0000 "application " | Freq: Once | CUTANEOUS | Status: DC | PRN

## 2018-02-21 MED ORDER — LIDOCAINE HCL URETHRAL/MUCOSAL 2 % EX GEL
1.0000 "application " | Freq: Once | CUTANEOUS | Status: DC
  Filled 2018-02-21: qty 10

## 2018-02-21 NOTE — ED Triage Notes (Signed)
Pt arrives to ED via POV from home with c/o urinary retention x3-4 hrs. Pt reports a diagnosis of bladder cancer received today. No c/o CP, no SHOB, no fever, N/V/D.

## 2018-02-21 NOTE — ED Notes (Signed)
Bladder scan= 479mL

## 2018-02-21 NOTE — ED Provider Notes (Signed)
[facility] Emergency Department Provider Note  ____________________________________________   First MD Initiated Contact with Patient 02/21/18 0044     (approximate)  I have reviewed the triage vital signs and the nursing notes.   HISTORY  Chief Complaint Urinary Retention   HPI Edward Cummings is a 84 y.o. male who self presents the emergency department with about 3 to 4 hours of acute urinary retention.   Today he received a diagnosis of bladder cancer and has not yet begun treatment.  He was able to urinate until about 3 hours prior to arrival at which point he attempted to and nothing would come out.  No fevers or chills.  No back pain.  Symptoms have been slowly progressive and he now has moderate to severe suprapubic discomfort.  Some nausea.   Past Medical History:  Diagnosis Date  . Anemia   . Cancer ([place])   . Chronic kidney disease   . Depression   . Hypertension   . Neuromuscular disorder ([place])    Nerve damage to left face/eye since around 2002.    Patient Active Problem List   Diagnosis Date Noted  . Cancer of overlapping sites of bladder ([place]) 02/04/2018  . Anemia due to stage 3 chronic kidney disease ([place]) 02/04/2018  . AKI (acute kidney injury) ([place]) 12/12/2017    Past Surgical History:  Procedure Laterality Date  . CYSTOSCOPY W/ RETROGRADES Bilateral 01/25/2018   Procedure: CYSTOSCOPY WITH RETROGRADE PYELOGRAM;  [name], MD;  [name];  Service: Urology;  Laterality: Bilateral;  . CYSTOSCOPY WITH STENT PLACEMENT Bilateral 01/25/2018   Procedure: CYSTOSCOPY WITH STENT PLACEMENT;  [name], MD;  [name];  Service: Urology;  Laterality: Bilateral;  . EYE SURGERY     Cornea transplants bilaterally & cataract surgery.  . TRANSURETHRAL RESECTION OF BLADDER TUMOR N/A 01/25/2018   Procedure: TRANSURETHRAL RESECTION OF BLADDER TUMOR (TURBT);  [name], MD;  Location:  [facility] ORS;  Service: Urology;  [name];    Prior to Admission medications   Medication Sig Start Date End Date Taking? Authorizing Provider  amLODipine (NORVASC) 10 MG tablet Take 10 mg by mouth daily.  08/25/13   [provider]  aspirin EC 81 MG tablet Take 81 mg by mouth daily.     [provider]  diphenhydrAMINE (BENADRYL) 25 MG tablet Take 25 mg by mouth daily as needed (ALLERGIES).    [provider]  docusate sodium (COLACE) 100 MG capsule Take 2 capsules (200 mg total) by mouth 2 (two) times daily as needed for mild constipation. 12/14/17   Salary, Avel Peace, MD  HYDROcodone-acetaminophen (NORCO/VICODIN) 5-325 MG tablet Take 1 tablet by mouth every 4 (four) hours as needed for moderate pain. 01/25/18   Stoioff, Ronda Fairly, MD  Multiple Vitamin (MULTIVITAMIN WITH MINERALS) TABS tablet Take 1 tablet by mouth daily. One-A-Day    [provider]  oxybutynin (DITROPAN) 5 MG tablet 1 tab tid prn frequency,urgency, bladder spasm 01/25/18   Stoioff, Ronda Fairly, MD  prednisoLONE acetate (PRED FORTE) 1 % ophthalmic suspension Place 1 drop into both eyes daily.    [provider]  tamsulosin (FLOMAX) 0.4 MG CAPS capsule Take 0.4 mg by mouth daily.    [provider]    Allergies Patient has no known allergies.  Family History  Problem Relation Age of Onset  . Prostate cancer Neg Hx   . Kidney cancer Neg Hx   .  Bladder Cancer Neg Hx     Social History Social History   Tobacco Use  . Smoking status: Former Research scientist (life sciences)  . Smokeless tobacco: Current User    Types: Chew  . Tobacco comment: Stopped approximately 10 years ago.  Substance Use Topics  . Alcohol use: Yes    Alcohol/week: 2.0 standard drinks    Types: 2 Cans of beer per week    Comment: Daily  . Drug use: Never    Review of Systems Constitutional: No fever/chills Cardiovascular: Denies chest pain. Respiratory: Denies shortness of breath. Gastrointestinal: Positive for  abdominal pain.  Positive for nausea, no vomiting.  No diarrhea.  No constipation. Genitourinary: Urinary retention Musculoskeletal: Negative for back pain. Skin: Negative for rash. Neurological: Negative for headaches, focal weakness or numbness.   ____________________________________________   PHYSICAL EXAM:  VITAL SIGNS: ED Triage Vitals  Enc Vitals Group     BP 02/21/18 0031 140/75     Pulse Rate 02/21/18 0031 (!) 133     Resp 02/21/18 0031 18     Temp 02/21/18 0031 97.8 F (36.6 C)     Temp Source 02/21/18 0031 Oral     SpO2 02/21/18 0031 100 %     Weight 02/21/18 0029 133 lb (60.3 kg)     Height 02/21/18 0029 5\' 8"  (1.727 m)     Head Circumference --      Peak Flow --      Pain Score 02/21/18 0028 10     Pain Loc --      Pain Edu? --      Excl. in [place]? --     Constitutional: Alert and oriented x4 somewhat uncomfortable appearing nontoxic no diaphoresis Cardiovascular: Normal rate, regular rhythm. Grossly normal heart sounds.  Good peripheral circulation. Respiratory: Normal respiratory effort.  No retractions. Lungs CTAB and moving good air Gastrointestinal: No peritonitis.  He does have some suprapubic fullness and tenderness Musculoskeletal: No lower extremity edema   Neurologic:  Normal speech and language. No gross focal neurologic deficits are appreciated. Skin:  Skin is warm, dry and intact. No rash noted. Psychiatric: Mood and affect are normal. Speech and behavior are normal.    ____________________________________________   DIFFERENTIAL includes but not limited to  Acute urinary retention, prostatitis, pyelonephritis ____________________________________________   LABS (all labs ordered are listed, but only abnormal results are displayed)  Labs Reviewed  URINALYSIS, COMPLETE (UACMP) WITH MICROSCOPIC - Abnormal; Notable for the following components:      Result Value   Color, Urine YELLOW (*)    APPearance CLEAR (*)    Hgb urine dipstick LARGE (*)     Protein, ur 30 (*)    Leukocytes, UA MODERATE (*)    RBC / HPF >50 (*)    WBC, UA >50 (*)    All other components within normal limits    Urinalysis reviewed by me with large amount of blood __________________________________________  EKG   ____________________________________________  RADIOLOGY   ____________________________________________   PROCEDURES  Procedure(s) performed: no  Procedures  Critical Care performed: no  ____________________________________________   INITIAL IMPRESSION / ASSESSMENT AND PLAN / ED COURSE  Pertinent labs & imaging results that were available during my care of the patient were reviewed by me and considered in my medical decision making (see chart for details).   As part of my medical decision making, I reviewed the following data within the [place] History obtained from family if available, nursing notes, old chart and ekg, as  well as notes from prior ED visits.  The patient has acute urinary retention with a bladder scan of about 500 cc.  Nursing was able to place a Foley catheter without complication and the patient's symptoms nearly completely resolved thereafter.  No gross clot.  I will discharge him home with a Foley catheter in place and a one-week urology follow-up.      ____________________________________________   FINAL CLINICAL IMPRESSION(S) / ED DIAGNOSES  Final diagnoses:  Urinary retention      NEW MEDICATIONS STARTED DURING THIS VISIT:  Discharge Medication List as of 02/21/2018  6:33 AM       Note:  This document was prepared using Dragon voice recognition software and may include unintentional dictation errors.     Darel Hong, MD 02/22/18 1007

## 2018-02-22 ENCOUNTER — Other Ambulatory Visit: Payer: Medicare HMO

## 2018-02-22 NOTE — Patient Instructions (Signed)
Atezolizumab injection What is this medicine? ATEZOLIZUMAB (a te zoe LIZ ue mab) is a monoclonal antibody. It is used to treat bladder cancer (urothelial cancer) and non-small cell lung cancer. This medicine may be used for other purposes; ask your health care provider or pharmacist if you have questions. COMMON BRAND NAME(S): Tecentriq What should I tell my health care provider before I take this medicine? They need to know if you have any of these conditions: -diabetes -immune system problems -infection -inflammatory bowel disease -liver disease -lung or breathing disease -lupus -nervous system problems like myasthenia gravis or Guillain-Barre syndrome -organ transplant -an unusual or allergic reaction to atezolizumab, other medicines, foods, dyes, or preservatives -pregnant or trying to get pregnant -breast-feeding How should I use this medicine? This medicine is for infusion into a vein. It is given by a health care professional in a hospital or clinic setting. A special MedGuide will be given to you before each treatment. Be sure to read this information carefully each time. Talk to your pediatrician regarding the use of this medicine in children. Special care may be needed. Overdosage: If you think you have taken too much of this medicine contact a poison control center or emergency room at once. NOTE: This medicine is only for you. Do not share this medicine with others. What if I miss a dose? It is important not to miss your dose. Call your doctor or health care professional if you are unable to keep an appointment. What may interact with this medicine? Interactions have not been studied. This list may not describe all possible interactions. Give your health care provider a list of all the medicines, herbs, non-prescription drugs, or dietary supplements you use. Also tell them if you smoke, drink alcohol, or use illegal drugs. Some items may interact with your medicine. What  should I watch for while using this medicine? Your condition will be monitored carefully while you are receiving this medicine. You may need blood work done while you are taking this medicine. Do not become pregnant while taking this medicine or for at least 5 months after stopping it. Women should inform their doctor if they wish to become pregnant or think they might be pregnant. There is a potential for serious side effects to an unborn child. Talk to your health care professional or pharmacist for more information. Do not breast-feed an infant while taking this medicine or for at least 5 months after the last dose. What side effects may I notice from receiving this medicine? Side effects that you should report to your doctor or health care professional as soon as possible: -allergic reactions like skin rash, itching or hives, swelling of the face, lips, or tongue -black, tarry stools -bloody or watery diarrhea -breathing problems -changes in vision -chest pain or chest tightness -chills -facial flushing -fever -headache -signs and symptoms of high blood sugar such as dizziness; dry mouth; dry skin; fruity breath; nausea; stomach pain; increased hunger or thirst; increased urination -signs and symptoms of liver injury like dark yellow or brown urine; general ill feeling or flu-like symptoms; light-colored stools; loss of appetite; nausea; right upper belly pain; unusually weak or tired; yellowing of the eyes or skin -stomach pain -trouble passing urine or change in the amount of urine Side effects that usually do not require medical attention (report to your doctor or health care professional if they continue or are bothersome): -cough -diarrhea -joint pain -muscle pain -muscle weakness -tiredness -weight loss This list may not describe all   possible side effects. Call your doctor for medical advice about side effects. You may report side effects to FDA at 1-800-FDA-1088. Where should  I keep my medicine? This drug is given in a hospital or clinic and will not be stored at home. NOTE: This sheet is a summary. It may not cover all possible information. If you have questions about this medicine, talk to your doctor, pharmacist, or health care provider.  2018 Elsevier/Gold Standard (2015-06-30 17:54:14)  

## 2018-02-25 ENCOUNTER — Inpatient Hospital Stay: Payer: Medicare HMO

## 2018-02-26 ENCOUNTER — Encounter
Admission: RE | Admit: 2018-02-26 | Discharge: 2018-02-26 | Disposition: A | Discharge status: Home / Self Care | Payer: Medicare HMO | Source: Ambulatory Visit | Attending: Internal Medicine | Admitting: Internal Medicine

## 2018-02-26 DIAGNOSIS — R911 Solitary pulmonary nodule: Secondary | ICD-10-CM | POA: Diagnosis not present

## 2018-02-26 DIAGNOSIS — R590 Localized enlarged lymph nodes: Secondary | ICD-10-CM | POA: Diagnosis not present

## 2018-02-26 DIAGNOSIS — C678 Malignant neoplasm of overlapping sites of bladder: Secondary | ICD-10-CM | POA: Diagnosis not present

## 2018-02-26 DIAGNOSIS — C679 Malignant neoplasm of bladder, unspecified: Secondary | ICD-10-CM | POA: Diagnosis not present

## 2018-02-26 LAB — GLUCOSE, CAPILLARY: GLUCOSE-CAPILLARY: 88 mg/dL (ref 70–99)

## 2018-02-26 IMAGING — PT NM PET TUM IMG INITIAL (PI) SKULL BASE T - THIGH
2 series · 25 of 25 positions shown · non-contrast
Comparison: None.

CLINICAL DATA: Initial treatment strategy for bladder cancer.

EXAM:
NUCLEAR MEDICINE PET SKULL BASE TO THIGH
TECHNIQUE: 7.3 mCi F-18 FDG was injected intravenously. Full-ring PET imaging
was performed from the skull base to thigh after the radiotracer. CT
data was obtained and used for attenuation correction and anatomic
localization.
Fasting blood glucose: 88 mg/dl

[Series 4: pet wb (ac) · axial · 5.0mm · 2.72mm/px · z∈[-1046,-180]mm · 13 of 290 slices shown]
[im 1/290]
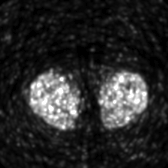
[im 25/290]
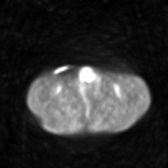
[im 49/290]
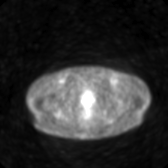
[im 73/290]
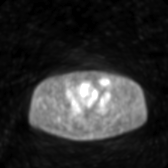
[im 97/290]
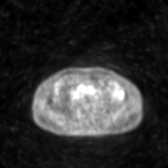
[im 121/290]
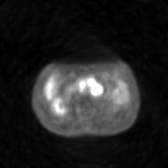
[im 145/290]
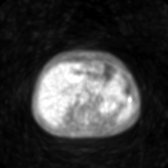
[im 169/290]
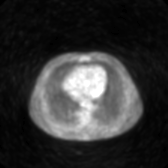
[im 193/290]
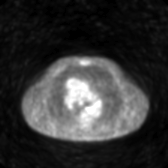
[im 217/290]
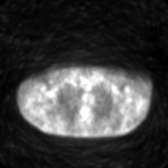
[im 241/290]
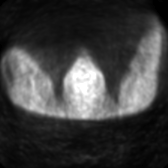
[im 265/290]
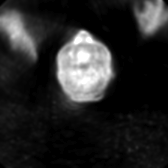
[im 290/290]
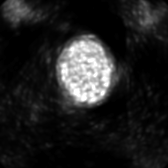

[Series 5: pet wb uncorrected (nac) · axial · 5.0mm · 4.07mm/px · z∈[-1046,-180]mm · 12 of 290 slices shown]
[im 1/290]
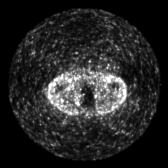
[im 27/290]
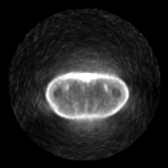
[im 53/290]
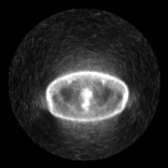
[im 79/290]
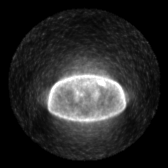
[im 106/290]
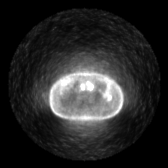
[im 132/290]
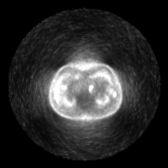
[im 158/290]
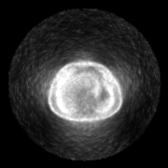
[im 184/290]
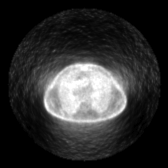
[im 211/290]
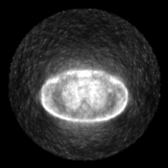
[im 237/290]
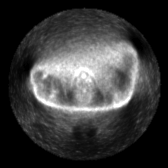
[im 263/290]
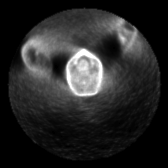
[im 290/290]
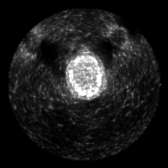

[25 of 25 positions shown; findings below may reference images not displayed]

FINDINGS: Mediastinal blood pool activity: SUV max

NECK: No hypermetabolic lymph nodes in the neck.

Incidental CT findings: none

CHEST: Within the posterior mediastinum there is a small lymph node
between the descending aorta and esophagus which measures 1 cm and
has an SUV max of 5.03.

Several tiny lower lobe pulmonary nodules are identified which are
too small to reliably characterize by PET-CT the largest is in the
posterior left lower lobe measuring 4 mm, image 121/3. Nonspecific.

Incidental CT findings: Mild to moderate changes of emphysema.
Aortic atherosclerosis. Calcification in the RCA, LAD, left
circumflex and left main coronary artery noted.

ABDOMEN/PELVIS: No abnormal radiotracer uptake identified within the
liver, pancreas, or spleen. Extensive retroperitoneal adenopathy
identified. Left retroperitoneal nodal mass measures 5.2 by 3.1 cm
within SUV max of 10.6. Hypermetabolic right common iliac node
measures 1 cm and has an SUV max of 6.88. Lymph node just below the
aortic bifurcation measures 0.8 cm and has an SUV max of 5.19 thick
walled urinary bladder is collapsed around a Foley catheter balloon.

Incidental CT findings: Bilateral nephroureteral stents are in
place. Aortic atherosclerosis noted. No aneurysm.

SKELETON: There is a lytic lesion involving the right inferior pubic
rami which has an SUV max of 9.9.

Incidental CT findings: none
IMPRESSION: 1. Enlarged and hypermetabolic retroperitoneal lymph nodes
identified compatible with metastatic adenopathy. There also
hypermetabolic right common iliac lymph nodes and a small mildly
hypermetabolic posterior mediastinal lymph node.
2. Hypermetabolic lytic lesion is noted within the right inferior
pubic rami.
3. Aortic Atherosclerosis ([2P]-[2P]) and Emphysema ([2P]-[2P]).
4. Multi vessel coronary artery atherosclerotic calcifications
including left main disease.
5. A few small scattered pulmonary nodules are noted which are
nonspecific and too small to characterize by PET-CT.

## 2018-02-26 MED ORDER — FLUDEOXYGLUCOSE F - 18 (FDG) INJECTION
6.9000 | Freq: Once | INTRAVENOUS | Status: AC | PRN
  Administered 2018-02-26: 7.3 via INTRAVENOUS

## 2018-02-28 ENCOUNTER — Inpatient Hospital Stay: Payer: Medicare HMO

## 2018-02-28 ENCOUNTER — Telehealth: Payer: Self-pay | Admitting: *Deleted

## 2018-02-28 ENCOUNTER — Inpatient Hospital Stay (HOSPITAL_BASED_OUTPATIENT_CLINIC_OR_DEPARTMENT_OTHER): Payer: Medicare HMO | Admitting: Internal Medicine

## 2018-02-28 ENCOUNTER — Ambulatory Visit (INDEPENDENT_AMBULATORY_CARE_PROVIDER_SITE_OTHER): Payer: Medicare HMO

## 2018-02-28 ENCOUNTER — Other Ambulatory Visit: Payer: Self-pay

## 2018-02-28 ENCOUNTER — Encounter: Payer: Self-pay | Admitting: Internal Medicine

## 2018-02-28 VITALS — BP 112/64 | HR 81 | Temp 98.0°F | Resp 20

## 2018-02-28 DIAGNOSIS — M545 Low back pain: Secondary | ICD-10-CM | POA: Diagnosis not present

## 2018-02-28 DIAGNOSIS — G8929 Other chronic pain: Secondary | ICD-10-CM | POA: Diagnosis not present

## 2018-02-28 DIAGNOSIS — C678 Malignant neoplasm of overlapping sites of bladder: Secondary | ICD-10-CM | POA: Diagnosis not present

## 2018-02-28 DIAGNOSIS — N184 Chronic kidney disease, stage 4 (severe): Secondary | ICD-10-CM

## 2018-02-28 DIAGNOSIS — D649 Anemia, unspecified: Secondary | ICD-10-CM | POA: Diagnosis not present

## 2018-02-28 DIAGNOSIS — D631 Anemia in chronic kidney disease: Secondary | ICD-10-CM

## 2018-02-28 DIAGNOSIS — N183 Chronic kidney disease, stage 3 (moderate): Secondary | ICD-10-CM

## 2018-02-28 DIAGNOSIS — K5900 Constipation, unspecified: Secondary | ICD-10-CM | POA: Diagnosis not present

## 2018-02-28 DIAGNOSIS — R630 Anorexia: Secondary | ICD-10-CM

## 2018-02-28 DIAGNOSIS — C7951 Secondary malignant neoplasm of bone: Secondary | ICD-10-CM | POA: Diagnosis not present

## 2018-02-28 DIAGNOSIS — E611 Iron deficiency: Secondary | ICD-10-CM

## 2018-02-28 DIAGNOSIS — R634 Abnormal weight loss: Secondary | ICD-10-CM | POA: Diagnosis not present

## 2018-02-28 DIAGNOSIS — Z79899 Other long term (current) drug therapy: Secondary | ICD-10-CM | POA: Diagnosis not present

## 2018-02-28 DIAGNOSIS — Z5112 Encounter for antineoplastic immunotherapy: Secondary | ICD-10-CM | POA: Diagnosis not present

## 2018-02-28 DIAGNOSIS — R339 Retention of urine, unspecified: Secondary | ICD-10-CM | POA: Diagnosis not present

## 2018-02-28 DIAGNOSIS — R5383 Other fatigue: Secondary | ICD-10-CM | POA: Diagnosis not present

## 2018-02-28 DIAGNOSIS — Z87891 Personal history of nicotine dependence: Secondary | ICD-10-CM

## 2018-02-28 LAB — CBC WITH DIFFERENTIAL/PLATELET
Basophils Absolute: 0.1 10*3/uL (ref 0–0.1)
Basophils Relative: 1 %
EOS PCT: 7 %
Eosinophils Absolute: 0.5 10*3/uL (ref 0–0.7)
HCT: 28.3 % — ABNORMAL LOW (ref 40.0–52.0)
Hemoglobin: 10 g/dL — ABNORMAL LOW (ref 13.0–18.0)
LYMPHS ABS: 1.5 10*3/uL (ref 1.0–3.6)
LYMPHS PCT: 21 %
MCH: 32.7 pg (ref 26.0–34.0)
MCHC: 35.3 g/dL (ref 32.0–36.0)
MCV: 92.9 fL (ref 80.0–100.0)
MONO ABS: 0.5 10*3/uL (ref 0.2–1.0)
MONOS PCT: 7 %
Neutro Abs: 4.7 10*3/uL (ref 1.4–6.5)
Neutrophils Relative %: 64 %
PLATELETS: 286 10*3/uL (ref 150–440)
RBC: 3.04 MIL/uL — ABNORMAL LOW (ref 4.40–5.90)
RDW: 12.8 % (ref 11.5–14.5)
WBC: 7.2 10*3/uL (ref 3.8–10.6)

## 2018-02-28 LAB — BASIC METABOLIC PANEL
Anion gap: 5 (ref 5–15)
BUN: 31 mg/dL — AB (ref 8–23)
CHLORIDE: 107 mmol/L (ref 98–111)
CO2: 28 mmol/L (ref 22–32)
Calcium: 9.2 mg/dL (ref 8.9–10.3)
Creatinine, Ser: 2.24 mg/dL — ABNORMAL HIGH (ref 0.61–1.24)
GFR calc Af Amer: 29 mL/min — ABNORMAL LOW (ref >60)
GFR calc non Af Amer: 25 mL/min — ABNORMAL LOW (ref >60)
GLUCOSE: 101 mg/dL — AB (ref 70–99)
Potassium: 4.1 mmol/L (ref 3.5–5.1)
Sodium: 140 mmol/L (ref 135–145)

## 2018-02-28 MED ORDER — SODIUM CHLORIDE 0.9 % IV SOLN: 200.0000 mg | Freq: Once | INTRAVENOUS | Status: DC

## 2018-02-28 MED ORDER — SODIUM CHLORIDE 0.9 % IV SOLN
Freq: Once | INTRAVENOUS | Status: AC
  Filled 2018-02-28: qty 250

## 2018-02-28 MED ORDER — SODIUM CHLORIDE 0.9 % IV SOLN
1200.0000 mg | Freq: Once | INTRAVENOUS | Status: AC
  Administered 2018-02-28: 1200 mg via INTRAVENOUS
  Filled 2018-02-28: qty 20

## 2018-02-28 MED ORDER — IRON SUCROSE 20 MG/ML IV SOLN
200.0000 mg | Freq: Once | INTRAVENOUS | Status: AC
  Administered 2018-02-28: 200 mg via INTRAVENOUS
  Filled 2018-02-28: qty 10

## 2018-02-28 MED ORDER — SODIUM CHLORIDE 0.9 % IV SOLN
Freq: Once | INTRAVENOUS | Status: AC
  Administered 2018-02-28: 10:00:00 via INTRAVENOUS
  Filled 2018-02-28: qty 250

## 2018-02-28 NOTE — Progress Notes (Signed)
Per Dr Rogue Bussing proceed with treatment today with CBC/ BMP results

## 2018-02-28 NOTE — Progress Notes (Signed)
Catheter Removal  Patient is present today for a catheter removal.  15ml of water was drained from the balloon. A 16FR foley cath was removed from the bladder no complications were noted . Patient tolerated well.  Preformed by: Ambrose [place]  Follow up/ Additional notes: Return at 1430 for PVR

## 2018-02-28 NOTE — Progress Notes (Signed)
Parchment CONSULT NOTE  Patient Care Team: [name], MD as PCP - General (Internal Medicine)  CHIEF COMPLAINTS/PURPOSE OF CONSULTATION:  Bladder cancer  #  Oncology History   # AUG 2019-TRANSITIONAL CELL BLADDER CA [~ 4cm tumor] s/p cystoscopy [Dr.Stoiff]  with extensive angiolymphatic invasion; [name]; [name]; mediastinal uptake; right pubic rami uptake.  # 19th ep 2019- Tecentrir  # CKD stage III-IV [creat 2.5]  # Molecular testing- pending.   DIAGNOSIS: Bladder ca  STAGE:   IV      ;GOALS: palliative  CURRENT/MOST RECENT THERAPY:Tecentriq        Cancer of overlapping sites of bladder ([place])   02/27/2018 -  Chemotherapy    The patient had atezolizumab (TECENTRIQ) 1,[name], 1,200 mg, Intravenous, Once, 1 of 6 cycles  for chemotherapy treatment.       HISTORY OF PRESENTING ILLNESS: Edward Cummings 84 y.o.  male with a new diagnosis of bladder cancer is here for follow-up/review the results of the PET scan/proceed with Tecentriq.  Patient continues to complain of ongoing fatigue.  Poor appetite.  Positive weight loss.  No hematuria.  Chronic back pain.  Complains of constipation.  No nausea or abdominal pain.  Review of Systems  Constitutional: Positive for malaise/fatigue and weight loss (10-12 pounds 1-2 months). Negative for chills, diaphoresis and fever.  HENT: Negative for nosebleeds and sore throat.   Eyes: Negative for double vision.  Respiratory: Negative for cough, hemoptysis, sputum production, shortness of breath and wheezing.   Cardiovascular: Negative for chest pain, palpitations, orthopnea and leg swelling.  Gastrointestinal: Positive for constipation. Negative for abdominal pain, blood in stool, diarrhea, heartburn, melena, nausea and vomiting.  Genitourinary:  Negative for dysuria, frequency and urgency.  Musculoskeletal: Positive for back pain. Negative for joint pain.  Skin: Negative.  Negative for itching and rash.  Neurological: Negative for dizziness, tingling, focal weakness, weakness and headaches.  Endo/Heme/Allergies: Does not bruise/bleed easily.  Psychiatric/Behavioral: Negative for depression. The patient is not nervous/anxious and does not have insomnia.      MEDICAL HISTORY:  Past Medical History:  Diagnosis Date  . Anemia   . Cancer ([place])   . Chronic kidney disease   . Depression   . Hypertension   . Neuromuscular disorder ([place])    Nerve damage to left face/eye since around 2002.    SURGICAL HISTORY: Past Surgical History:  Procedure Laterality Date  . CYSTOSCOPY W/ RETROGRADES Bilateral 01/25/2018   Procedure: CYSTOSCOPY WITH RETROGRADE PYELOGRAM;  [name], MD;  [name];  Service: Urology;  Laterality: Bilateral;  . CYSTOSCOPY WITH STENT PLACEMENT Bilateral 01/25/2018   Procedure: CYSTOSCOPY WITH STENT PLACEMENT;  [name], MD;  [name];  Service: Urology;  Laterality: Bilateral;  . EYE SURGERY     Cornea transplants bilaterally & cataract surgery.  . TRANSURETHRAL RESECTION OF BLADDER TUMOR N/A 01/25/2018   Procedure: TRANSURETHRAL RESECTION OF BLADDER TUMOR (TURBT);  [name], MD;  [name];  Service: Urology;  [name];    SOCIAL HISTORY: lives in [place]; with wife; quit smoking 18 years ago; beer every 2 months or so.mechanic/retd.   Social History   Socioeconomic History  . Marital status: Married    Spouse name: Not on file  . Number of children: Not on file  .  Years of education: Not on file  . Highest education level: Not on file  Occupational History  . Not on file  Social Needs  . Financial resource strain: Not hard at all  . Food insecurity:    Worry: Never true    Inability: Never true  . Transportation  needs:    Medical: Not on file    Non-medical: Not on file  Tobacco Use  . Smoking status: Former Research scientist (life sciences)  . Smokeless tobacco: Current User    Types: Chew  . Tobacco comment: Stopped approximately 10 years ago.  Substance and Sexual Activity  . Alcohol use: Yes    Alcohol/week: 2.0 standard drinks    Types: 2 Cans of beer per week    Comment: Daily  . Drug use: Never  . Sexual activity: Not on file  Lifestyle  . Physical activity:    Days per week: Not on file    Minutes per session: Not on file  . Stress: Only a little  Relationships  . Social connections:    Talks on phone: Not on file    Gets together: Not on file    Attends religious service: Not on file    Active member of club or organization: Not on file    Attends meetings of clubs or organizations: Not on file    Relationship status: Not on file  . Intimate partner violence:    Fear of current or ex partner: Not on file    Emotionally abused: Not on file    Physically abused: Not on file    Forced sexual activity: Not on file  Other Topics Concern  . Not on file  Social History Narrative  . Not on file    FAMILY HISTORY: Family History  Problem Relation Age of Onset  . Prostate cancer Neg Hx   . Kidney cancer Neg Hx   . Bladder Cancer Neg Hx     ALLERGIES:  has No Known Allergies.  MEDICATIONS:  Current Outpatient Medications  Medication Sig Dispense Refill  . amLODipine (NORVASC) 10 MG tablet Take 10 mg by mouth daily.     Marland Kitchen aspirin EC 81 MG tablet Take 81 mg by mouth daily.     . diphenhydrAMINE (BENADRYL) 25 MG tablet Take 25 mg by mouth daily as needed (ALLERGIES).    Marland Kitchen docusate sodium (COLACE) 100 MG capsule Take 2 capsules (200 mg total) by mouth 2 (two) times daily as needed for mild constipation. 10 capsule 0  . HYDROcodone-acetaminophen (NORCO/VICODIN) 5-325 MG tablet Take 1 tablet by mouth every 4 (four) hours as needed for moderate pain. 20 tablet 0  . Multiple Vitamin (MULTIVITAMIN WITH  MINERALS) TABS tablet Take 1 tablet by mouth daily. One-A-Day    . oxybutynin (DITROPAN) 5 MG tablet 1 tab tid prn frequency,urgency, bladder spasm 15 tablet 0  . prednisoLONE acetate (PRED FORTE) 1 % ophthalmic suspension Place 1 drop into both eyes daily.    . tamsulosin (FLOMAX) 0.4 MG CAPS capsule Take 0.4 mg by mouth daily.     No current facility-administered medications for this visit.    Facility-Administered Medications Ordered in Other Visits  Medication Dose Route Frequency Provider Last Rate Last Dose  . 0.9 %  sodium chloride infusion   Intravenous Once Cammie Sickle, MD      . [facility]) 1,[name]  1,200 mg Intravenous Once Cammie Sickle, MD      .  iron sucrose (VENOFER) injection 200 mg  200 mg Intravenous Once Charlaine Dalton R, MD          .  PHYSICAL EXAMINATION: ECOG PERFORMANCE STATUS: 1 - Symptomatic but completely ambulatory  Vitals:   02/28/18 0900  BP: 112/64  Pulse: 81  Resp: 20  Temp: 98 F (36.7 C)   There were no vitals filed for this visit.  Physical Exam  Constitutional: He is oriented to person, place, and time.  Accompanied by son and wife.  Walking himself.  Thin built moderately nourished male patient.  Appears pale.  HENT:  Head: Normocephalic and atraumatic.  Mouth/Throat: Oropharynx is clear and moist. No oropharyngeal exudate.  Eyes: Pupils are equal, round, and reactive to light.  Chronic drooping of the left eyelid.  Neck: Normal range of motion. Neck supple.  Cardiovascular: Normal rate and regular rhythm.  Pulmonary/Chest: No respiratory distress. He has no wheezes.  Abdominal: Soft. Bowel sounds are normal. He exhibits no distension and no mass. There is no tenderness. There is no rebound and no guarding.  Musculoskeletal: Normal range of motion. He exhibits no edema or tenderness.  Neurological: He is alert and oriented to person, place, and time.   Skin: Skin is warm.  Psychiatric: Affect normal.     LABORATORY DATA:  I have reviewed the data as listed Lab Results  Component Value Date   WBC 7.2 02/28/2018   HGB 10.0 (L) 02/28/2018   HCT 28.3 (L) 02/28/2018   MCV 92.9 02/28/2018   PLT 286 02/28/2018   Recent Labs    02/04/18 1556 02/15/18 0838 02/15/18 1354 02/28/18 0840  NA 140 142 141 140  K 4.5 3.8 4.4 4.1  CL 109 109 111 107  CO2 21* 23 23 28   GLUCOSE 100* 96 120* 101*  BUN 37* 34* 36* 31*  CREATININE 2.28* 2.61* 2.58* 2.24*  CALCIUM 9.1 9.1 9.0 9.2  GFRNONAA 25* 21* 21* 25*  GFRAA 29* 25* 25* 29*  PROT 7.3 7.4 7.1  --   ALBUMIN 3.6 3.9 3.6  --   AST 27 23 19   --   ALT 20 12 11   --   ALKPHOS 82 93 91  --   BILITOT 0.4 0.7 0.7  --     RADIOGRAPHIC STUDIES: I have personally reviewed the radiological images as listed and agreed with the findings in the report. Nm Pet Image Initial (pi) Skull Base To Thigh  Result Date: 02/26/2018 CLINICAL DATA:  Initial treatment strategy for bladder cancer. EXAM: NUCLEAR MEDICINE PET SKULL BASE TO THIGH TECHNIQUE: 7.3 mCi F-18 FDG was injected intravenously. Full-ring PET imaging was performed from the skull base to thigh after the radiotracer. CT data was obtained and used for attenuation correction and anatomic localization. Fasting blood glucose: 88 mg/dl COMPARISON:  None. FINDINGS: Mediastinal blood pool activity: SUV max 2.44 NECK: No hypermetabolic lymph nodes in the neck. Incidental CT findings: none CHEST: Within the posterior mediastinum there is a small lymph node between the descending aorta and esophagus which measures 1 cm and has an SUV max of 5.03. Several tiny lower lobe pulmonary nodules are identified which are too small to reliably characterize by PET-CT the largest is in the posterior left lower lobe measuring 4 mm, image 121/3. Nonspecific. Incidental CT findings: Mild to moderate changes of emphysema. Aortic atherosclerosis. Calcification in the RCA, LAD,  left circumflex and left main coronary artery noted. ABDOMEN/PELVIS: No abnormal radiotracer uptake identified within the liver, pancreas, or spleen. Extensive retroperitoneal  adenopathy identified. Left retroperitoneal nodal mass measures 5.2 by 3.1 cm within SUV max of 10.6. Hypermetabolic right common iliac node measures 1 cm and has an SUV max of 6.88. Lymph node just below the aortic bifurcation measures 0.8 cm and has an SUV max of 5.19 thick walled urinary bladder is collapsed around a Foley catheter balloon. Incidental CT findings: Bilateral nephroureteral stents are in place. Aortic atherosclerosis noted. No aneurysm. SKELETON: There is a lytic lesion involving the right inferior pubic rami which has an SUV max of 9.9. Incidental CT findings: none IMPRESSION: 1. Enlarged and hypermetabolic retroperitoneal lymph nodes identified compatible with metastatic adenopathy. There also hypermetabolic right common iliac lymph nodes and a small mildly hypermetabolic posterior mediastinal lymph node. 2. Hypermetabolic lytic lesion is noted within the right inferior pubic rami. 3. Aortic Atherosclerosis (ICD10-I70.0) and Emphysema (ICD10-J43.9). 4. Multi vessel coronary artery atherosclerotic calcifications including left main disease. 5. A few small scattered pulmonary nodules are noted which are nonspecific and too small to characterize by PET-CT. Electronically Signed   By: Kerby Moors M.D.   On: 02/26/2018 14:02   Ct Biopsy  Result Date: 02/15/2018 INDICATION: Bladder mass, retroperitoneal adenopathy EXAM: CT BIOPSY LEFT RETROPERITONEAL ADENOPATHY MEDICATIONS: 1% lidocaine local ANESTHESIA/SEDATION: Moderate (conscious) sedation was employed during this procedure. A total of Versed 1.5 mg and Fentanyl 75 mcg was administered intravenously. Moderate Sedation Time: 11 minutes. The patient's level of consciousness and vital signs were monitored continuously by radiology nursing throughout the procedure under my  direct supervision. FLUOROSCOPY TIME:  Fluoroscopy Time: None. COMPLICATIONS: None immediate. PROCEDURE: Informed written consent was obtained from the patient after a thorough discussion of the procedural risks, benefits and alternatives. All questions were addressed. Maximal Sterile Barrier Technique was utilized including caps, mask, sterile gowns, sterile gloves, sterile drape, hand hygiene and skin antiseptic. A timeout was performed prior to the initiation of the procedure. Previous imaging reviewed. Patient positioned prone. Noncontrast localization CT performed. The left retroperitoneal adenopathy was localized. Overlying skin marked for a left posterior paraspinous approach. Under CT guidance, a 17 gauge 11.8 cm access needle was advanced from a posterior approach to the left retroperitoneal adenopathy. Needle position confirmed with CT. 18 gauge core biopsies obtained. Samples were intact and non fragmented. These were placed in formalin. Needle removed. Postprocedure imaging demonstrates no hemorrhage or hematoma. Patient tolerated the biopsy well. IMPRESSION: Successful CT-guided left retroperitoneal adenopathy 18 gauge core biopsies Electronically Signed   By: Jerilynn Mages.  Shick M.D.   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  Stage IV; SEP 17th PET-bulky retroperitoneal adenopathy; mediastinal uptake; right pubic rami uptake.  # proceed with Tecentriq infusion #1 today. Labs today reviewed;  acceptable for treatment today.   #Again reviewed the potential side effects of immunotherapy in detail.  #Iron deficiency CKD-IV-hemoglobin between 9-10.  Recommend Venofer every 3 weeks.  #Solitary right pubic rami metastases-monitor for now patient would be candidate for Xgeva.  We will monitor for now.  # constipation-recommend MiraLAX.  #Follow-up in 3 weeks  labs MD Tecentriq IV iron.  # I reviewed the blood work- with the patient in detail; also reviewed the imaging independently [as summarized above]; and with the patient in detail.     All questions were answered. The patient knows to call the clinic with any problems, questions or concerns.    Cammie Sickle, MD  02/28/2018 10:46 AM

## 2018-02-28 NOTE — Assessment & Plan Note (Addendum)
#  High-grade transitional cell carcinoma of the bladder metastatic to retroperitoneal lymph node.  Stage IV; SEP 17th PET-bulky retroperitoneal adenopathy; mediastinal uptake; right pubic rami uptake.  # proceed with Tecentriq infusion #1 today. Labs today reviewed;  acceptable for treatment today.   #Again reviewed the potential side effects of immunotherapy in detail.  #Iron deficiency CKD-IV-hemoglobin between 9-10.  Recommend Venofer every 3 weeks.  #Solitary right pubic rami metastases-monitor for now patient would be candidate for Xgeva.  We will monitor for now.  # constipation-recommend MiraLAX.  #Follow-up in 3 weeks labs MD Tecentriq IV iron.  # I reviewed the blood work- with the patient in detail; also reviewed the imaging independently [as summarized above]; and with the patient in detail.

## 2018-02-28 NOTE — Telephone Encounter (Signed)
Patient called to report that after having his foley catheter removed today he is unable to void. Advised patient to contact urologist for follow up. If the urologist could not be reached patient should go to emergency room. Patient verbalized understanding of plan.

## 2018-03-01 ENCOUNTER — Ambulatory Visit: Payer: Medicare HMO | Admitting: Internal Medicine

## 2018-03-05 ENCOUNTER — Ambulatory Visit: Payer: Medicare HMO | Admitting: Urology

## 2018-03-08 DIAGNOSIS — C679 Malignant neoplasm of bladder, unspecified: Secondary | ICD-10-CM | POA: Diagnosis not present

## 2018-03-08 DIAGNOSIS — H53002 Unspecified amblyopia, left eye: Secondary | ICD-10-CM | POA: Diagnosis not present

## 2018-03-08 DIAGNOSIS — D509 Iron deficiency anemia, unspecified: Secondary | ICD-10-CM | POA: Diagnosis not present

## 2018-03-08 DIAGNOSIS — I10 Essential (primary) hypertension: Secondary | ICD-10-CM | POA: Diagnosis not present

## 2018-03-21 ENCOUNTER — Inpatient Hospital Stay (HOSPITAL_BASED_OUTPATIENT_CLINIC_OR_DEPARTMENT_OTHER): Payer: Medicare HMO | Admitting: Internal Medicine

## 2018-03-21 ENCOUNTER — Encounter: Payer: Self-pay | Admitting: Internal Medicine

## 2018-03-21 ENCOUNTER — Other Ambulatory Visit: Payer: Self-pay

## 2018-03-21 ENCOUNTER — Inpatient Hospital Stay: Payer: Medicare HMO

## 2018-03-21 ENCOUNTER — Inpatient Hospital Stay: Payer: Medicare HMO | Attending: Internal Medicine

## 2018-03-21 VITALS — BP 104/65 | HR 52 | Temp 95.7°F | Resp 18

## 2018-03-21 VITALS — BP 137/56 | HR 69 | Temp 97.6°F | Resp 18 | Ht 68.0 in | Wt 128.0 lb

## 2018-03-21 DIAGNOSIS — D631 Anemia in chronic kidney disease: Secondary | ICD-10-CM | POA: Diagnosis not present

## 2018-03-21 DIAGNOSIS — K5900 Constipation, unspecified: Secondary | ICD-10-CM | POA: Diagnosis not present

## 2018-03-21 DIAGNOSIS — C772 Secondary and unspecified malignant neoplasm of intra-abdominal lymph nodes: Secondary | ICD-10-CM | POA: Insufficient documentation

## 2018-03-21 DIAGNOSIS — C7951 Secondary malignant neoplasm of bone: Secondary | ICD-10-CM | POA: Insufficient documentation

## 2018-03-21 DIAGNOSIS — Z5112 Encounter for antineoplastic immunotherapy: Secondary | ICD-10-CM | POA: Diagnosis not present

## 2018-03-21 DIAGNOSIS — R5383 Other fatigue: Secondary | ICD-10-CM

## 2018-03-21 DIAGNOSIS — N183 Chronic kidney disease, stage 3 unspecified: Secondary | ICD-10-CM

## 2018-03-21 DIAGNOSIS — C678 Malignant neoplasm of overlapping sites of bladder: Secondary | ICD-10-CM | POA: Diagnosis not present

## 2018-03-21 DIAGNOSIS — N139 Obstructive and reflux uropathy, unspecified: Secondary | ICD-10-CM

## 2018-03-21 DIAGNOSIS — Z87891 Personal history of nicotine dependence: Secondary | ICD-10-CM

## 2018-03-21 LAB — COMPREHENSIVE METABOLIC PANEL
ALBUMIN: 3.6 g/dL (ref 3.5–5.0)
ALK PHOS: 86 U/L (ref 38–126)
ALT: 20 U/L (ref 0–44)
AST: 23 U/L (ref 15–41)
Anion gap: 8 (ref 5–15)
BILIRUBIN TOTAL: 0.8 mg/dL (ref 0.3–1.2)
BUN: 33 mg/dL — ABNORMAL HIGH (ref 8–23)
CO2: 23 mmol/L (ref 22–32)
CREATININE: 2.5 mg/dL — AB (ref 0.61–1.24)
Calcium: 9.1 mg/dL (ref 8.9–10.3)
Chloride: 109 mmol/L (ref 98–111)
GFR calc Af Amer: 26 mL/min — ABNORMAL LOW (ref >60)
GFR, EST NON AFRICAN AMERICAN: 22 mL/min — AB (ref >60)
GLUCOSE: 100 mg/dL — AB (ref 70–99)
POTASSIUM: 4.3 mmol/L (ref 3.5–5.1)
Sodium: 140 mmol/L (ref 135–145)
TOTAL PROTEIN: 6.7 g/dL (ref 6.5–8.1)

## 2018-03-21 LAB — CBC WITH DIFFERENTIAL/PLATELET
ABS IMMATURE GRANULOCYTES: 0.02 10*3/uL (ref 0.00–0.07)
BASOS PCT: 1 %
Basophils Absolute: 0 10*3/uL (ref 0.0–0.1)
EOS PCT: 8 %
Eosinophils Absolute: 0.4 10*3/uL (ref 0.0–0.5)
HCT: 24.5 % — ABNORMAL LOW (ref 39.0–52.0)
HEMOGLOBIN: 7.9 g/dL — AB (ref 13.0–17.0)
Immature Granulocytes: 0 %
Lymphocytes Relative: 29 %
Lymphs Abs: 1.4 10*3/uL (ref 0.7–4.0)
MCH: 30.6 pg (ref 26.0–34.0)
MCHC: 32.2 g/dL (ref 30.0–36.0)
MCV: 95 fL (ref 80.0–100.0)
MONOS PCT: 10 %
Monocytes Absolute: 0.5 10*3/uL (ref 0.1–1.0)
NEUTROS ABS: 2.5 10*3/uL (ref 1.7–7.7)
Neutrophils Relative %: 52 %
Platelets: 290 10*3/uL (ref 150–400)
RBC: 2.58 MIL/uL — ABNORMAL LOW (ref 4.22–5.81)
RDW: 12.9 % (ref 11.5–15.5)
WBC: 4.9 10*3/uL (ref 4.0–10.5)
nRBC: 0 % (ref 0.0–0.2)

## 2018-03-21 LAB — SAMPLE TO BLOOD BANK

## 2018-03-21 MED ORDER — SODIUM CHLORIDE 0.9 % IV SOLN
Freq: Once | INTRAVENOUS | Status: DC
  Filled 2018-03-21: qty 250

## 2018-03-21 MED ORDER — SODIUM CHLORIDE 0.9 % IV SOLN
Freq: Once | INTRAVENOUS | Status: AC
  Administered 2018-03-21: 10:00:00 via INTRAVENOUS
  Filled 2018-03-21: qty 250

## 2018-03-21 MED ORDER — IRON SUCROSE 20 MG/ML IV SOLN: 200.0000 mg | Freq: Once | INTRAVENOUS | Status: DC

## 2018-03-21 MED ORDER — IRON SUCROSE 20 MG/ML IV SOLN
200.0000 mg | Freq: Once | INTRAVENOUS | Status: AC
  Administered 2018-03-21: 200 mg via INTRAVENOUS
  Filled 2018-03-21: qty 10

## 2018-03-21 MED ORDER — SODIUM CHLORIDE 0.9 % IV SOLN
1200.0000 mg | Freq: Once | INTRAVENOUS | Status: AC
  Administered 2018-03-21: 1200 mg via INTRAVENOUS
  Filled 2018-03-21: qty 20

## 2018-03-21 NOTE — Progress Notes (Signed)
[facility] CONSULT NOTE  Patient Care Team: [name], MD as PCP - General (Internal Medicine)  CHIEF COMPLAINTS/PURPOSE OF CONSULTATION:  Bladder cancer  #  Oncology History   # AUG 2019-TRANSITIONAL CELL BLADDER CA [~ 4cm tumor] s/p cystoscopy [Dr.Stoiff]  with extensive angiolymphatic invasion; [name]; [name]; mediastinal uptake; right pubic rami uptake.  # 19th ep 2019- Tecentrir  # CKD stage III-IV [creat 2.5]  # Molecular testing- pending.   DIAGNOSIS: Bladder ca  STAGE:   IV      ;GOALS: palliative  CURRENT/MOST RECENT THERAPY:Tecentriq        Cancer of overlapping sites of bladder ([place])   02/27/2018 -  Chemotherapy    The patient had atezolizumab (TECENTRIQ) 1,[name], 1,200 mg, Intravenous, Once, 2 of 6 cycles Administration: 1,200 mg (02/28/2018)  for chemotherapy treatment.       HISTORY OF PRESENTING ILLNESS: Edward Cummings 84 y.o.  male with metastatic transitional carcinoma of the bladder currently on Tecentriq is here for follow-up.  Patient interim had his Foley catheter taken out.  Is currently on Flomax.  Patient complains of mild to moderate fatigue.  Otherwise appetite is fair.  Weight is stable.  No blood in urine.  No diarrhea.  Constipation stable.  Dizziness and standing.  Review of Systems  Constitutional: Positive for malaise/fatigue and weight loss (10-12 pounds 1-2 months). Negative for chills, diaphoresis and fever.  HENT: Negative for nosebleeds and sore throat.   Eyes: Negative for double vision.  Respiratory: Negative for cough, hemoptysis, sputum production, shortness of breath and wheezing.   Cardiovascular: Negative for chest pain, palpitations, orthopnea and leg swelling.  Gastrointestinal: Positive for constipation. Negative for abdominal pain,  blood in stool, diarrhea, heartburn, melena, nausea and vomiting.  Genitourinary: Negative for dysuria, frequency and urgency.  Musculoskeletal: Positive for back pain. Negative for joint pain.  Skin: Negative.  Negative for itching and rash.  Neurological: Negative for dizziness, tingling, focal weakness, weakness and headaches.  Endo/Heme/Allergies: Does not bruise/bleed easily.  Psychiatric/Behavioral: Negative for depression. The patient is not nervous/anxious and does not have insomnia.      MEDICAL HISTORY:  Past Medical History:  Diagnosis Date  . Anemia   . Cancer ([place])   . Chronic kidney disease   . Depression   . Hypertension   . Neuromuscular disorder ([place])    Nerve damage to left face/eye since around 2002.    SURGICAL HISTORY: Past Surgical History:  Procedure Laterality Date  . CYSTOSCOPY W/ RETROGRADES Bilateral 01/25/2018   Procedure: CYSTOSCOPY WITH RETROGRADE PYELOGRAM;  [name], MD;  [name];  Service: Urology;  Laterality: Bilateral;  . CYSTOSCOPY WITH STENT PLACEMENT Bilateral 01/25/2018   Procedure: CYSTOSCOPY WITH STENT PLACEMENT;  [name], MD;  [name];  Service: Urology;  Laterality: Bilateral;  . EYE SURGERY     Cornea transplants bilaterally & cataract surgery.  . TRANSURETHRAL RESECTION OF BLADDER TUMOR N/A 01/25/2018   Procedure: TRANSURETHRAL RESECTION OF BLADDER TUMOR (TURBT);  [name], MD;  [name];  Service: Urology;  [name];    SOCIAL HISTORY: lives in [place]; with wife; quit smoking 18 years ago; beer every 2 months or so.mechanic/retd.   Social History   Socioeconomic History  . Marital status: Married    Spouse  name: Not on file  . Number of children: Not on file  . Years of education: Not on file  . Highest education level: Not on file  Occupational History  . Not on file  Social Needs  . Financial resource strain: Not hard at all  .  Food insecurity:    Worry: Never true    Inability: Never true  . Transportation needs:    Medical: Not on file    Non-medical: Not on file  Tobacco Use  . Smoking status: Former Research scientist (life sciences)  . Smokeless tobacco: Current User    Types: Chew  . Tobacco comment: Stopped approximately 10 years ago.  Substance and Sexual Activity  . Alcohol use: Yes    Alcohol/week: 2.0 standard drinks    Types: 2 Cans of beer per week    Comment: Daily  . Drug use: Never  . Sexual activity: Not on file  Lifestyle  . Physical activity:    Days per week: Not on file    Minutes per session: Not on file  . Stress: Only a little  Relationships  . Social connections:    Talks on phone: Not on file    Gets together: Not on file    Attends religious service: Not on file    Active member of club or organization: Not on file    Attends meetings of clubs or organizations: Not on file    Relationship status: Not on file  . Intimate partner violence:    Fear of current or ex partner: Not on file    Emotionally abused: Not on file    Physically abused: Not on file    Forced sexual activity: Not on file  Other Topics Concern  . Not on file  Social History Narrative  . Not on file    FAMILY HISTORY: Family History  Problem Relation Age of Onset  . Prostate cancer Neg Hx   . Kidney cancer Neg Hx   . Bladder Cancer Neg Hx     ALLERGIES:  has No Known Allergies.  MEDICATIONS:  Current Outpatient Medications  Medication Sig Dispense Refill  . amLODipine (NORVASC) 10 MG tablet Take 10 mg by mouth daily.     Marland Kitchen aspirin EC 81 MG tablet Take 81 mg by mouth daily.     Marland Kitchen HYDROcodone-acetaminophen (NORCO/VICODIN) 5-325 MG tablet Take 1 tablet by mouth every 4 (four) hours as needed for moderate pain. 20 tablet 0  . Multiple Vitamin (MULTIVITAMIN WITH MINERALS) TABS tablet Take 1 tablet by mouth daily. One-A-Day    . oxybutynin (DITROPAN) 5 MG tablet 1 tab tid prn frequency,urgency, bladder spasm 15 tablet 0   . prednisoLONE acetate (PRED FORTE) 1 % ophthalmic suspension Place 1 drop into both eyes daily.    . tamsulosin (FLOMAX) 0.4 MG CAPS capsule Take 0.4 mg by mouth daily.    . diphenhydrAMINE (BENADRYL) 25 MG tablet Take 25 mg by mouth daily as needed (ALLERGIES).    Marland Kitchen docusate sodium (COLACE) 100 MG capsule Take 2 capsules (200 mg total) by mouth 2 (two) times daily as needed for mild constipation. (Patient not taking: Reported on 03/21/2018) 10 capsule 0   No current facility-administered medications for this visit.    Facility-Administered Medications Ordered in Other Visits  Medication Dose Route Frequency Provider Last Rate Last Dose  . 0.9 %  sodium chloride infusion   Intravenous Once Cammie Sickle, MD      . [facility]) 1,200 mg in sodium chloride  0.9 % 250 mL chemo infusion  1,200 mg Intravenous Once Charlaine Dalton R, MD      . iron sucrose (VENOFER) injection 200 mg  200 mg Intravenous Once Charlaine Dalton R, MD          .  PHYSICAL EXAMINATION: ECOG PERFORMANCE STATUS: 1 - Symptomatic but completely ambulatory  Vitals:   03/21/18 0913  BP: (!) 137/56  Pulse: 69  Resp: 18  Temp: 97.6 F (36.4 C)   Filed Weights   03/21/18 0913  Weight: 128 lb (58.1 kg)    Physical Exam  Constitutional: He is oriented to person, place, and time.  Accompanied by son and wife.  Walking himself.  Thin built moderately nourished male patient.  Appears pale.  HENT:  Head: Normocephalic and atraumatic.  Mouth/Throat: Oropharynx is clear and moist. No oropharyngeal exudate.  Eyes: Pupils are equal, round, and reactive to light.  Chronic drooping of the left eyelid.  Neck: Normal range of motion. Neck supple.  Cardiovascular: Normal rate and regular rhythm.  Pulmonary/Chest: No respiratory distress. He has no wheezes.  Abdominal: Soft. Bowel sounds are normal. He exhibits no distension and no mass. There is no tenderness. There is no rebound and no guarding.   Musculoskeletal: Normal range of motion. He exhibits no edema or tenderness.  Neurological: He is alert and oriented to person, place, and time.  Skin: Skin is warm.  Psychiatric: Affect normal.     LABORATORY DATA:  I have reviewed the data as listed Lab Results  Component Value Date   WBC 4.9 03/21/2018   HGB 7.9 (L) 03/21/2018   HCT 24.5 (L) 03/21/2018   MCV 95.0 03/21/2018   PLT 290 03/21/2018   Recent Labs    02/15/18 0838 02/15/18 1354 02/28/18 0840 03/21/18 0840  NA 142 141 140 140  K 3.8 4.4 4.1 4.3  CL 109 111 107 109  CO2 23 23 28 23   GLUCOSE 96 120* 101* 100*  BUN 34* 36* 31* 33*  CREATININE 2.61* 2.58* 2.24* 2.50*  CALCIUM 9.1 9.0 9.2 9.1  GFRNONAA 21* 21* 25* 22*  GFRAA 25* 25* 29* 26*  PROT 7.4 7.1  --  6.7  ALBUMIN 3.9 3.6  --  3.6  AST 23 19  --  23  ALT 12 11  --  20  ALKPHOS 93 91  --  86  BILITOT 0.7 0.7  --  0.8    RADIOGRAPHIC STUDIES: I have personally reviewed the radiological images as listed and agreed with the findings in the report. Nm Pet Image Initial (pi) Skull Base To Thigh  Result Date: 02/26/2018 CLINICAL DATA:  Initial treatment strategy for bladder cancer. EXAM: NUCLEAR MEDICINE PET SKULL BASE TO THIGH TECHNIQUE: 7.3 mCi F-18 FDG was injected intravenously. Full-ring PET imaging was performed from the skull base to thigh after the radiotracer. CT data was obtained and used for attenuation correction and anatomic localization. Fasting blood glucose: 88 mg/dl COMPARISON:  None. FINDINGS: Mediastinal blood pool activity: SUV max 2.44 NECK: No hypermetabolic lymph nodes in the neck. Incidental CT findings: none CHEST: Within the posterior mediastinum there is a small lymph node between the descending aorta and esophagus which measures 1 cm and has an SUV max of 5.03. Several tiny lower lobe pulmonary nodules are identified which are too small to reliably characterize by PET-CT the largest is in the posterior left lower lobe measuring 4 mm,  image 121/3. Nonspecific. Incidental CT findings: Mild to moderate changes of emphysema. Aortic  atherosclerosis. Calcification in the RCA, LAD, left circumflex and left main coronary artery noted. ABDOMEN/PELVIS: No abnormal radiotracer uptake identified within the liver, pancreas, or spleen. Extensive retroperitoneal adenopathy identified. Left retroperitoneal nodal mass measures 5.2 by 3.1 cm within SUV max of 10.6. Hypermetabolic right common iliac node measures 1 cm and has an SUV max of 6.88. Lymph node just below the aortic bifurcation measures 0.8 cm and has an SUV max of 5.19 thick walled urinary bladder is collapsed around a Foley catheter balloon. Incidental CT findings: Bilateral nephroureteral stents are in place. Aortic atherosclerosis noted. No aneurysm. SKELETON: There is a lytic lesion involving the right inferior pubic rami which has an SUV max of 9.9. Incidental CT findings: none IMPRESSION: 1. Enlarged and hypermetabolic retroperitoneal lymph nodes identified compatible with metastatic adenopathy. There also hypermetabolic right common iliac lymph nodes and a small mildly hypermetabolic posterior mediastinal lymph node. 2. Hypermetabolic lytic lesion is noted within the right inferior pubic rami. 3. Aortic Atherosclerosis (ICD10-I70.0) and Emphysema (ICD10-J43.9). 4. Multi vessel coronary artery atherosclerotic calcifications including left main disease. 5. A few small scattered pulmonary nodules are noted which are nonspecific and too small to characterize by PET-CT. Electronically Signed   By: Kerby Moors M.D.   On: 02/26/2018 14:02    ASSESSMENT & PLAN:   Cancer of overlapping sites of bladder ([place]) # High-grade transitional cell carcinoma of the bladder metastatic to retroperitoneal lymph node.  Stage IV; SEP 17th PET-bulky retroperitoneal adenopathy; mediastinal uptake; right pubic rami uptake.  Currently on Tecentriq.  # Proceed with Tecentriq #2 today. Labs today reviewed;   Acceptable for treatment today-except for anemia/see discussion below.  ## Anemia sec to CKD/ on Iv iron.  Hemoglobin 7.9.  Hold off pRBC transfusion.  Iron deficiency CKD-IV- continue Venofer every 3 weeks.  # Urinary Obstruction- S/p foley explantation; Continue flomax 0.4; off foley  #Solitary right pubic rami metastases-monitor for now patient would be candidate for Xgeva.  We will monitor for now.  # constipation- improved on MiraLAX.  # DISPOSITION: # Tecentriq/venofer today # Follow-up in 3 weeks labs-cbc/cmp- MD Tecentriq IV venofer- Dr.B  All questions were answered. The patient knows to call the clinic with any problems, questions or concerns.    Cammie Sickle, MD 03/21/2018 10:20 AM

## 2018-03-21 NOTE — Progress Notes (Signed)
Ok to proceed with labs today per MD

## 2018-03-21 NOTE — Progress Notes (Signed)
Per Nira Conn RN Per Dr. Rogue Bussing okay to proceed with Tecentriq and venofer with Hemoglobin of 7.9

## 2018-03-21 NOTE — Assessment & Plan Note (Addendum)
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  Stage IV; SEP 17th PET-bulky retroperitoneal adenopathy; mediastinal uptake; right pubic rami uptake.  Currently on Tecentriq.  # Proceed with Tecentriq #2 today. Labs today reviewed;  Acceptable for treatment today-except for anemia/see discussion below.  ## Anemia sec to CKD/ on Iv iron.  Hemoglobin 7.9.  Hold off pRBC transfusion.  Iron deficiency CKD-IV- continue Venofer every 3 weeks.  # Urinary Obstruction- S/p foley explantation; Continue flomax 0.4; off foley  #Solitary right pubic rami metastases-monitor for now patient would be candidate for Xgeva.  We will monitor for now.  # constipation- improved on MiraLAX.  # DISPOSITION: # Tecentriq/venofer today # Follow-up in 3 weeks labs-cbc/cmp- MD Tecentriq IV venofer- Dr.B

## 2018-04-11 ENCOUNTER — Encounter: Payer: Self-pay | Admitting: Internal Medicine

## 2018-04-11 ENCOUNTER — Inpatient Hospital Stay: Payer: Medicare HMO

## 2018-04-11 ENCOUNTER — Inpatient Hospital Stay (HOSPITAL_BASED_OUTPATIENT_CLINIC_OR_DEPARTMENT_OTHER): Payer: Medicare HMO | Admitting: Internal Medicine

## 2018-04-11 ENCOUNTER — Other Ambulatory Visit: Payer: Self-pay

## 2018-04-11 ENCOUNTER — Telehealth: Payer: Self-pay | Admitting: Internal Medicine

## 2018-04-11 VITALS — BP 115/65 | HR 62 | Temp 97.4°F | Resp 16 | Wt 127.0 lb

## 2018-04-11 DIAGNOSIS — D631 Anemia in chronic kidney disease: Secondary | ICD-10-CM

## 2018-04-11 DIAGNOSIS — R42 Dizziness and giddiness: Secondary | ICD-10-CM | POA: Diagnosis not present

## 2018-04-11 DIAGNOSIS — N139 Obstructive and reflux uropathy, unspecified: Secondary | ICD-10-CM

## 2018-04-11 DIAGNOSIS — Z87891 Personal history of nicotine dependence: Secondary | ICD-10-CM

## 2018-04-11 DIAGNOSIS — Z5112 Encounter for antineoplastic immunotherapy: Secondary | ICD-10-CM | POA: Diagnosis not present

## 2018-04-11 DIAGNOSIS — C678 Malignant neoplasm of overlapping sites of bladder: Secondary | ICD-10-CM | POA: Diagnosis not present

## 2018-04-11 DIAGNOSIS — N183 Chronic kidney disease, stage 3 (moderate): Secondary | ICD-10-CM

## 2018-04-11 DIAGNOSIS — C772 Secondary and unspecified malignant neoplasm of intra-abdominal lymph nodes: Secondary | ICD-10-CM | POA: Diagnosis not present

## 2018-04-11 DIAGNOSIS — C7951 Secondary malignant neoplasm of bone: Secondary | ICD-10-CM | POA: Diagnosis not present

## 2018-04-11 DIAGNOSIS — K5900 Constipation, unspecified: Secondary | ICD-10-CM

## 2018-04-11 LAB — COMPREHENSIVE METABOLIC PANEL
ALT: 13 U/L (ref 0–44)
AST: 19 U/L (ref 15–41)
Albumin: 3.7 g/dL (ref 3.5–5.0)
Alkaline Phosphatase: 66 U/L (ref 38–126)
Anion gap: 4 — ABNORMAL LOW (ref 5–15)
BUN: 24 mg/dL — ABNORMAL HIGH (ref 8–23)
CO2: 26 mmol/L (ref 22–32)
Calcium: 9 mg/dL (ref 8.9–10.3)
Chloride: 111 mmol/L (ref 98–111)
Creatinine, Ser: 2.14 mg/dL — ABNORMAL HIGH (ref 0.61–1.24)
GFR calc Af Amer: 31 mL/min — ABNORMAL LOW (ref >60)
GFR, EST NON AFRICAN AMERICAN: 27 mL/min — AB (ref >60)
Glucose, Bld: 86 mg/dL (ref 70–99)
POTASSIUM: 4.1 mmol/L (ref 3.5–5.1)
Sodium: 141 mmol/L (ref 135–145)
TOTAL PROTEIN: 6.6 g/dL (ref 6.5–8.1)
Total Bilirubin: 0.4 mg/dL (ref 0.3–1.2)

## 2018-04-11 LAB — CBC WITH DIFFERENTIAL/PLATELET
ABS IMMATURE GRANULOCYTES: 0.01 10*3/uL (ref 0.00–0.07)
BASOS PCT: 1 %
Basophils Absolute: 0.1 10*3/uL (ref 0.0–0.1)
Eosinophils Absolute: 0.5 10*3/uL (ref 0.0–0.5)
Eosinophils Relative: 9 %
HCT: 27.5 % — ABNORMAL LOW (ref 39.0–52.0)
Hemoglobin: 9 g/dL — ABNORMAL LOW (ref 13.0–17.0)
IMMATURE GRANULOCYTES: 0 %
Lymphocytes Relative: 32 %
Lymphs Abs: 1.8 10*3/uL (ref 0.7–4.0)
MCH: 31.1 pg (ref 26.0–34.0)
MCHC: 32.7 g/dL (ref 30.0–36.0)
MCV: 95.2 fL (ref 80.0–100.0)
Monocytes Absolute: 0.5 10*3/uL (ref 0.1–1.0)
Monocytes Relative: 9 %
NEUTROS ABS: 2.7 10*3/uL (ref 1.7–7.7)
NEUTROS PCT: 49 %
NRBC: 0 % (ref 0.0–0.2)
PLATELETS: 173 10*3/uL (ref 150–400)
RBC: 2.89 MIL/uL — AB (ref 4.22–5.81)
RDW: 13 % (ref 11.5–15.5)
WBC: 5.4 10*3/uL (ref 4.0–10.5)

## 2018-04-11 MED ORDER — SODIUM CHLORIDE 0.9 % IV SOLN
Freq: Once | INTRAVENOUS | Status: AC
  Administered 2018-04-11: 10:00:00 via INTRAVENOUS
  Filled 2018-04-11: qty 250

## 2018-04-11 MED ORDER — AMLODIPINE BESYLATE 5 MG PO TABS: 5.0000 mg | ORAL_TABLET | Freq: Every day | ORAL | 3 refills | Status: DC

## 2018-04-11 MED ORDER — SODIUM CHLORIDE 0.9 % IV SOLN
1200.0000 mg | Freq: Once | INTRAVENOUS | Status: AC
  Administered 2018-04-11: 1200 mg via INTRAVENOUS
  Filled 2018-04-11: qty 20

## 2018-04-11 MED ORDER — IRON SUCROSE 20 MG/ML IV SOLN
200.0000 mg | Freq: Once | INTRAVENOUS | Status: AC
  Administered 2018-04-11: 200 mg via INTRAVENOUS
  Filled 2018-04-11: qty 10

## 2018-04-11 NOTE — Progress Notes (Signed)
[facility] CONSULT NOTE  Patient Care Team: [name], MD as PCP - General (Internal Medicine)  CHIEF COMPLAINTS/PURPOSE OF CONSULTATION:  Bladder cancer  #  Oncology History   # AUG 2019-TRANSITIONAL CELL BLADDER CA [~ 4cm tumor] s/p cystoscopy [Dr.Stoiff]  with extensive angiolymphatic invasion; [name]; [name]; mediastinal uptake; right pubic rami uptake.  # 19th ep 2019- Tecentrir  # CKD stage III-IV [creat 2.5]  # Molecular testing- pending.   DIAGNOSIS: Bladder ca  STAGE:   IV      ;GOALS: palliative  CURRENT/MOST RECENT THERAPY:Tecentriq        Cancer of overlapping sites of bladder ([place])   02/27/2018 -  Chemotherapy    The patient had atezolizumab (TECENTRIQ) 1,[name], 1,200 mg, Intravenous, Once, 2 of 6 cycles Administration: 1,200 mg (02/28/2018), 1,200 mg (03/21/2018)  for chemotherapy treatment.       HISTORY OF PRESENTING ILLNESS: Edward Cummings 84 y.o.  male with metastatic transitional carcinoma of the bladder currently on Tecentriq is here for follow-up.  Patient had an episode of dizziness especially on standing few days ago.  Currently improved.  Had mild abdominal pain for which he took hydrocodone currently improved.  Appetite improving.  No nausea no vomiting no diarrhea.  Continues to have mild constipation.  Review of Systems  Constitutional: Positive for malaise/fatigue. Negative for chills, diaphoresis and fever.  HENT: Negative for nosebleeds and sore throat.   Eyes: Negative for double vision.  Respiratory: Negative for cough, hemoptysis, sputum production, shortness of breath and wheezing.   Cardiovascular: Negative for chest pain, palpitations, orthopnea and leg swelling.  Gastrointestinal: Positive for constipation. Negative for abdominal pain, blood in  stool, diarrhea, heartburn, melena, nausea and vomiting.  Genitourinary: Negative for dysuria, frequency and urgency.  Musculoskeletal: Positive for back pain. Negative for joint pain.  Skin: Negative.  Negative for itching and rash.  Neurological: Positive for dizziness. Negative for tingling, focal weakness, weakness and headaches.  Endo/Heme/Allergies: Does not bruise/bleed easily.  Psychiatric/Behavioral: Negative for depression. The patient is not nervous/anxious and does not have insomnia.      MEDICAL HISTORY:  Past Medical History:  Diagnosis Date  . Anemia   . Cancer ([place])   . Chronic kidney disease   . Depression   . Hypertension   . Neuromuscular disorder ([place])    Nerve damage to left face/eye since around 2002.    SURGICAL HISTORY: Past Surgical History:  Procedure Laterality Date  . CYSTOSCOPY W/ RETROGRADES Bilateral 01/25/2018   Procedure: CYSTOSCOPY WITH RETROGRADE PYELOGRAM;  [name], MD;  [name];  Service: Urology;  Laterality: Bilateral;  . CYSTOSCOPY WITH STENT PLACEMENT Bilateral 01/25/2018   Procedure: CYSTOSCOPY WITH STENT PLACEMENT;  [name], MD;  [name];  Service: Urology;  Laterality: Bilateral;  . EYE SURGERY     Cornea transplants bilaterally & cataract surgery.  . TRANSURETHRAL RESECTION OF BLADDER TUMOR N/A 01/25/2018   Procedure: TRANSURETHRAL RESECTION OF BLADDER TUMOR (TURBT);  [name], MD;  [name];  Service: Urology;  [name];    SOCIAL HISTORY: lives in [place]; with wife; quit smoking 18 years ago; beer every 2 months or so.mechanic/retd.   Social History   Socioeconomic History  . Marital status: Married    Spouse name: Not on file  .  Number of children: Not on file  . Years of education: Not on file  . Highest education level: Not on file  Occupational History  . Not on file  Social Needs  . Financial resource strain: Not hard at all   . Food insecurity:    Worry: Never true    Inability: Never true  . Transportation needs:    Medical: Not on file    Non-medical: Not on file  Tobacco Use  . Smoking status: Former Research scientist (life sciences)  . Smokeless tobacco: Current User    Types: Chew  . Tobacco comment: Stopped approximately 10 years ago.  Substance and Sexual Activity  . Alcohol use: Yes    Alcohol/week: 2.0 standard drinks    Types: 2 Cans of beer per week    Comment: Daily  . Drug use: Never  . Sexual activity: Not on file  Lifestyle  . Physical activity:    Days per week: Not on file    Minutes per session: Not on file  . Stress: Only a little  Relationships  . Social connections:    Talks on phone: Not on file    Gets together: Not on file    Attends religious service: Not on file    Active member of club or organization: Not on file    Attends meetings of clubs or organizations: Not on file    Relationship status: Not on file  . Intimate partner violence:    Fear of current or ex partner: Not on file    Emotionally abused: Not on file    Physically abused: Not on file    Forced sexual activity: Not on file  Other Topics Concern  . Not on file  Social History Narrative  . Not on file    FAMILY HISTORY: Family History  Problem Relation Age of Onset  . Prostate cancer Neg Hx   . Kidney cancer Neg Hx   . Bladder Cancer Neg Hx     ALLERGIES:  has No Known Allergies.  MEDICATIONS:  Current Outpatient Medications  Medication Sig Dispense Refill  . amLODipine (NORVASC) 5 MG tablet Take 1 tablet (5 mg total) by mouth daily. 30 tablet 3  . aspirin EC 81 MG tablet Take 81 mg by mouth daily.     . diphenhydrAMINE (BENADRYL) 25 MG tablet Take 25 mg by mouth daily as needed (ALLERGIES).    Marland Kitchen docusate sodium (COLACE) 100 MG capsule Take 2 capsules (200 mg total) by mouth 2 (two) times daily as needed for mild constipation. 10 capsule 0  . HYDROcodone-acetaminophen (NORCO/VICODIN) 5-325 MG tablet Take 1 tablet  by mouth every 4 (four) hours as needed for moderate pain. 20 tablet 0  . Multiple Vitamin (MULTIVITAMIN WITH MINERALS) TABS tablet Take 1 tablet by mouth daily. One-A-Day    . oxybutynin (DITROPAN) 5 MG tablet 1 tab tid prn frequency,urgency, bladder spasm 15 tablet 0  . prednisoLONE acetate (PRED FORTE) 1 % ophthalmic suspension Place 1 drop into both eyes daily.    . tamsulosin (FLOMAX) 0.4 MG CAPS capsule Take 0.4 mg by mouth daily.     No current facility-administered medications for this visit.       Marland Kitchen  PHYSICAL EXAMINATION: ECOG PERFORMANCE STATUS: 1 - Symptomatic but completely ambulatory  Vitals:   04/11/18 0855  BP: 115/65  Pulse: 62  Resp: 16  Temp: (!) 97.4 F (36.3 C)   Filed Weights   04/11/18 0855  Weight: 127 lb (57.6 kg)  Physical Exam  Constitutional: He is oriented to person, place, and time.  Accompanied by son and wife.  Walking himself.  Thin built moderately nourished male patient.  Appears pale.  HENT:  Head: Normocephalic and atraumatic.  Mouth/Throat: Oropharynx is clear and moist. No oropharyngeal exudate.  Eyes: Pupils are equal, round, and reactive to light.  Chronic drooping of the left eyelid.  Neck: Normal range of motion. Neck supple.  Cardiovascular: Normal rate and regular rhythm.  Pulmonary/Chest: No respiratory distress. He has no wheezes.  Abdominal: Soft. Bowel sounds are normal. He exhibits no distension and no mass. There is no tenderness. There is no rebound and no guarding.  Musculoskeletal: Normal range of motion. He exhibits no edema or tenderness.  Neurological: He is alert and oriented to person, place, and time.  Skin: Skin is warm.  Psychiatric: Affect normal.     LABORATORY DATA:  I have reviewed the data as listed Lab Results  Component Value Date   WBC 5.4 04/11/2018   HGB 9.0 (L) 04/11/2018   HCT 27.5 (L) 04/11/2018   MCV 95.2 04/11/2018   PLT 173 04/11/2018   Recent Labs    02/15/18 1354 02/28/18 0840  03/21/18 0840 04/11/18 0759  NA 141 140 140 141  K 4.4 4.1 4.3 4.1  CL 111 107 109 111  CO2 23 28 23 26   GLUCOSE 120* 101* 100* 86  BUN 36* 31* 33* 24*  CREATININE 2.58* 2.24* 2.50* 2.14*  CALCIUM 9.0 9.2 9.1 9.0  GFRNONAA 21* 25* 22* 27*  GFRAA 25* 29* 26* 31*  PROT 7.1  --  6.7 6.6  ALBUMIN 3.6  --  3.6 3.7  AST 19  --  23 19  ALT 11  --  20 13  ALKPHOS 91  --  86 66  BILITOT 0.7  --  0.8 0.4    RADIOGRAPHIC STUDIES: I have personally reviewed the radiological images as listed and agreed with the findings in the report. No results found.  ASSESSMENT & PLAN:   Cancer of overlapping sites of bladder ([place]) # High-grade transitional cell carcinoma of the bladder metastatic to retroperitoneal lymph node.  Stage IV; SEP 17th PET-bulky retroperitoneal adenopathy; mediastinal uptake; right pubic rami uptake.  Currently on Tecentriq.  # Proceed with Tecentriq #3 today. Labs today reviewed;  acceptable for treatment today.  We will get a CT scan prior to next visit.  ## Anemia sec to CKD/ on Iv iron.hb- 9; improving; continue IV iron every 3 weeks.  # Dizyy-question orthostatic.  Recommend decreasing the dose of Norvasc to 5 mg a day.  # Urinary Obstruction- S/p foley explantation; Continue flomax 0.4.  Stable.  #Solitary right pubic rami metastases-monitor for now patient would be candidate for Xgeva.  Stable.  # constipation-on MiraLAX stable.  # DISPOSITION: # Tecentriq/venofer today # Follow-up in 3 weeks labs-cbc/cmp- MD Tecentriq IV venofer; CT prior- Dr.B  All questions were answered. The patient knows to call the clinic with any problems, questions or concerns.    Cammie Sickle, MD 04/11/2018 9:20 AM

## 2018-04-11 NOTE — Assessment & Plan Note (Addendum)
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  Stage IV; SEP 17th PET-bulky retroperitoneal adenopathy; mediastinal uptake; right pubic rami uptake.  Currently on Tecentriq.  # Proceed with Tecentriq #3 today. Labs today reviewed;  acceptable for treatment today.  We will get a CT scan prior to next visit.  ## Anemia sec to CKD/ on Iv iron.hb- 9; improving; continue IV iron every 3 weeks.  # Dizyy-question orthostatic.  Recommend decreasing the dose of Norvasc to 5 mg a day.  # Urinary Obstruction- S/p foley explantation; Continue flomax 0.4.  Stable.  #Solitary right pubic rami metastases-monitor for now patient would be candidate for Xgeva.  Stable.  # constipation-on MiraLAX stable.  # DISPOSITION: # Tecentriq/venofer today # Follow-up in 3 weeks labs-cbc/cmp- MD Tecentriq IV venofer; CT prior- Dr.B

## 2018-04-11 NOTE — Telephone Encounter (Signed)
Brooke please check if we have sent omniseq; if not we need to order. thx

## 2018-04-12 NOTE — Telephone Encounter (Signed)
Omniseq order has been signed and faxed.

## 2018-04-13 ENCOUNTER — Other Ambulatory Visit: Payer: Self-pay

## 2018-04-13 ENCOUNTER — Observation Stay
Admission: EM | Admit: 2018-04-13 | Discharge: 2018-04-14 | Disposition: A | Discharge status: Home / Self Care | Payer: Medicare HMO | Attending: Internal Medicine | Admitting: Internal Medicine

## 2018-04-13 ENCOUNTER — Emergency Department: Payer: Medicare HMO

## 2018-04-13 DIAGNOSIS — Z7982 Long term (current) use of aspirin: Secondary | ICD-10-CM | POA: Insufficient documentation

## 2018-04-13 DIAGNOSIS — Z8551 Personal history of malignant neoplasm of bladder: Secondary | ICD-10-CM | POA: Diagnosis not present

## 2018-04-13 DIAGNOSIS — J984 Other disorders of lung: Secondary | ICD-10-CM | POA: Diagnosis not present

## 2018-04-13 DIAGNOSIS — D631 Anemia in chronic kidney disease: Secondary | ICD-10-CM | POA: Insufficient documentation

## 2018-04-13 DIAGNOSIS — Z66 Do not resuscitate: Secondary | ICD-10-CM | POA: Diagnosis not present

## 2018-04-13 DIAGNOSIS — R55 Syncope and collapse: Secondary | ICD-10-CM | POA: Diagnosis not present

## 2018-04-13 DIAGNOSIS — N183 Chronic kidney disease, stage 3 (moderate): Secondary | ICD-10-CM | POA: Diagnosis not present

## 2018-04-13 DIAGNOSIS — I129 Hypertensive chronic kidney disease with stage 1 through stage 4 chronic kidney disease, or unspecified chronic kidney disease: Secondary | ICD-10-CM | POA: Diagnosis not present

## 2018-04-13 DIAGNOSIS — I491 Atrial premature depolarization: Secondary | ICD-10-CM | POA: Diagnosis not present

## 2018-04-13 DIAGNOSIS — N179 Acute kidney failure, unspecified: Secondary | ICD-10-CM | POA: Diagnosis not present

## 2018-04-13 DIAGNOSIS — N400 Enlarged prostate without lower urinary tract symptoms: Secondary | ICD-10-CM | POA: Insufficient documentation

## 2018-04-13 DIAGNOSIS — E860 Dehydration: Secondary | ICD-10-CM | POA: Insufficient documentation

## 2018-04-13 DIAGNOSIS — Z87891 Personal history of nicotine dependence: Secondary | ICD-10-CM | POA: Insufficient documentation

## 2018-04-13 DIAGNOSIS — R000 Tachycardia, unspecified: Secondary | ICD-10-CM | POA: Diagnosis not present

## 2018-04-13 DIAGNOSIS — I10 Essential (primary) hypertension: Secondary | ICD-10-CM | POA: Diagnosis not present

## 2018-04-13 DIAGNOSIS — Z79899 Other long term (current) drug therapy: Secondary | ICD-10-CM | POA: Insufficient documentation

## 2018-04-13 DIAGNOSIS — C679 Malignant neoplasm of bladder, unspecified: Secondary | ICD-10-CM | POA: Diagnosis not present

## 2018-04-13 LAB — CBC
HCT: 26.2 % — ABNORMAL LOW (ref 39.0–52.0)
Hemoglobin: 8.7 g/dL — ABNORMAL LOW (ref 13.0–17.0)
MCH: 31.4 pg (ref 26.0–34.0)
MCHC: 33.2 g/dL (ref 30.0–36.0)
MCV: 94.6 fL (ref 80.0–100.0)
NRBC: 0 % (ref 0.0–0.2)
PLATELETS: 168 10*3/uL (ref 150–400)
RBC: 2.77 MIL/uL — AB (ref 4.22–5.81)
RDW: 13.2 % (ref 11.5–15.5)
WBC: 5.5 10*3/uL (ref 4.0–10.5)

## 2018-04-13 LAB — URINALYSIS, COMPLETE (UACMP) WITH MICROSCOPIC
BACTERIA UA: NONE SEEN
Bilirubin Urine: NEGATIVE
Glucose, UA: NEGATIVE mg/dL
KETONES UR: NEGATIVE mg/dL
Nitrite: NEGATIVE
Protein, ur: NEGATIVE mg/dL
Specific Gravity, Urine: 1.006 (ref 1.005–1.030)
pH: 6 (ref 5.0–8.0)

## 2018-04-13 LAB — COMPREHENSIVE METABOLIC PANEL
ALT: 14 U/L (ref 0–44)
AST: 20 U/L (ref 15–41)
Albumin: 3.8 g/dL (ref 3.5–5.0)
Alkaline Phosphatase: 66 U/L (ref 38–126)
Anion gap: 7 (ref 5–15)
BUN: 26 mg/dL — AB (ref 8–23)
CHLORIDE: 111 mmol/L (ref 98–111)
CO2: 22 mmol/L (ref 22–32)
Calcium: 8.7 mg/dL — ABNORMAL LOW (ref 8.9–10.3)
Creatinine, Ser: 1.91 mg/dL — ABNORMAL HIGH (ref 0.61–1.24)
GFR calc Af Amer: 36 mL/min — ABNORMAL LOW (ref >60)
GFR calc non Af Amer: 31 mL/min — ABNORMAL LOW (ref >60)
Glucose, Bld: 97 mg/dL (ref 70–99)
Potassium: 3.9 mmol/L (ref 3.5–5.1)
SODIUM: 140 mmol/L (ref 135–145)
Total Bilirubin: 0.6 mg/dL (ref 0.3–1.2)
Total Protein: 6.4 g/dL — ABNORMAL LOW (ref 6.5–8.1)

## 2018-04-13 LAB — TROPONIN I: Troponin I: <0.03 ng/mL (ref <0.03)

## 2018-04-13 IMAGING — CT CT HEAD W/O CM
3 series · 16 of 46 positions shown, 19 images · non-contrast
Comparison: CT scan [DATE]

CLINICAL DATA: Syncope.

EXAM:
CT HEAD WITHOUT CONTRAST
TECHNIQUE: Contiguous axial images were obtained from the base of the skull
through the vertex without intravenous contrast.

[Series 3: head wo · axial · 0.40mm/px · z∈[-90,+30]mm · 10 of 29 slices shown, 13 images]
[im 3/29  brain]
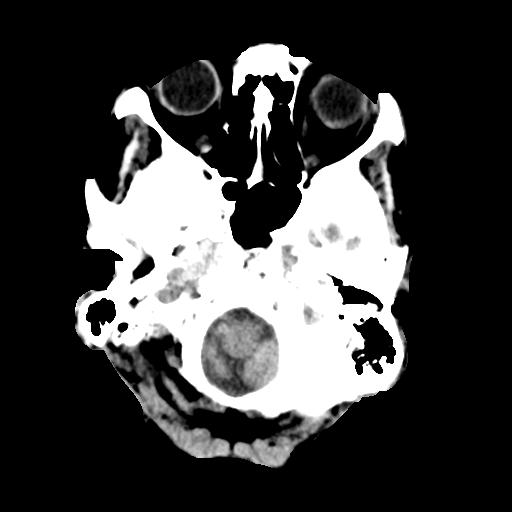
[im 3/29  bone]
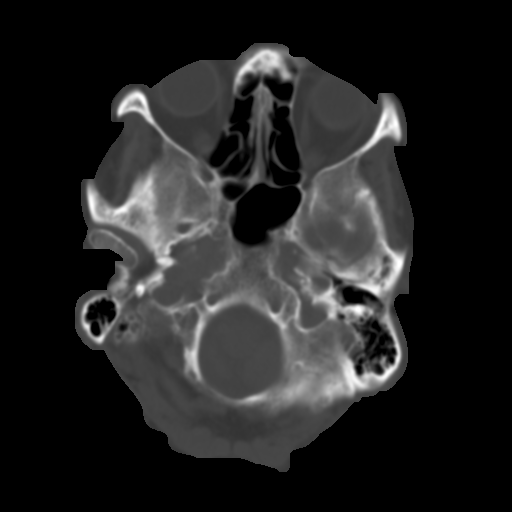
[im 6/29  brain]
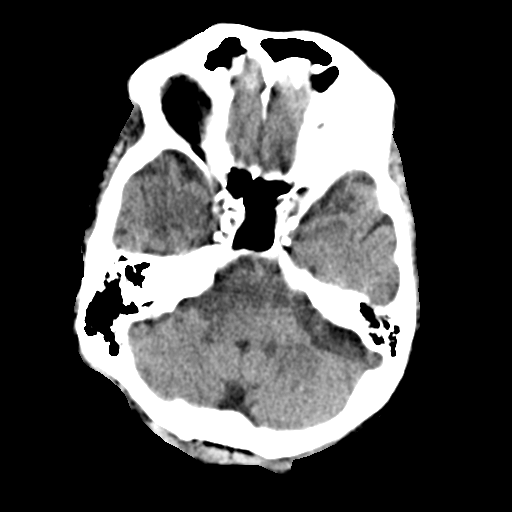
[im 8/29  brain]
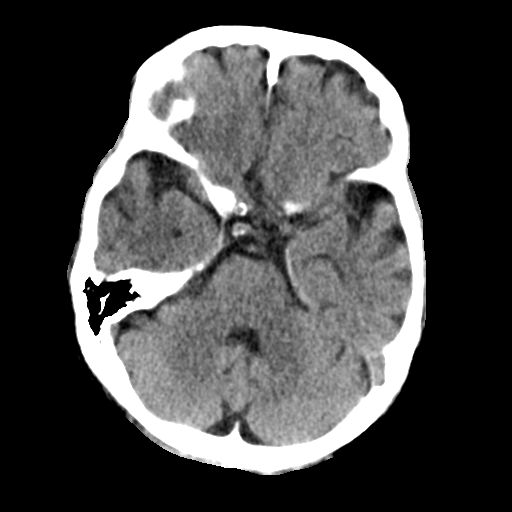
[im 11/29  brain]
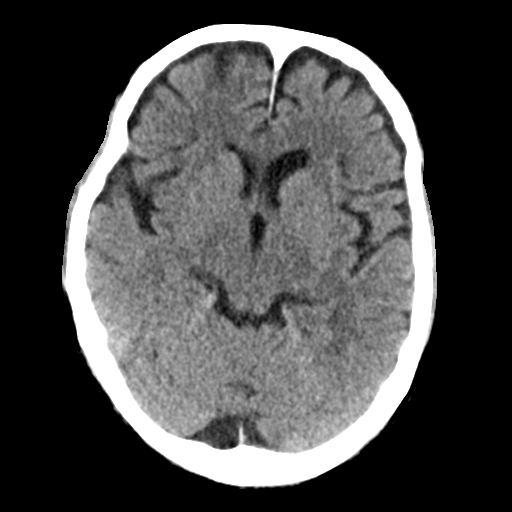
[im 14/29  brain]
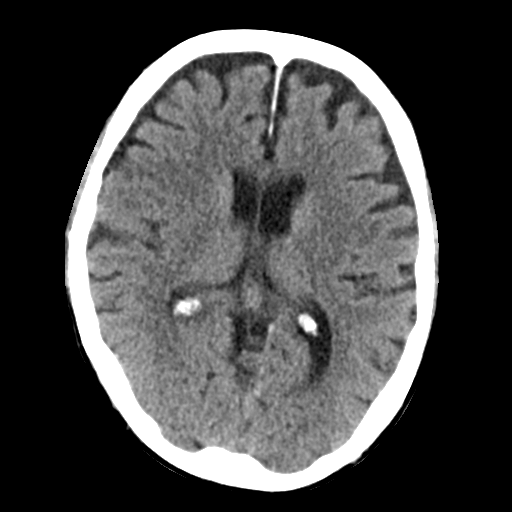
[im 14/29  bone]
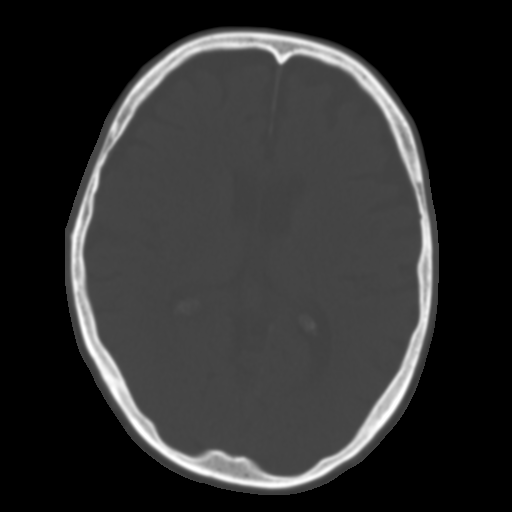
[im 16/29  brain]
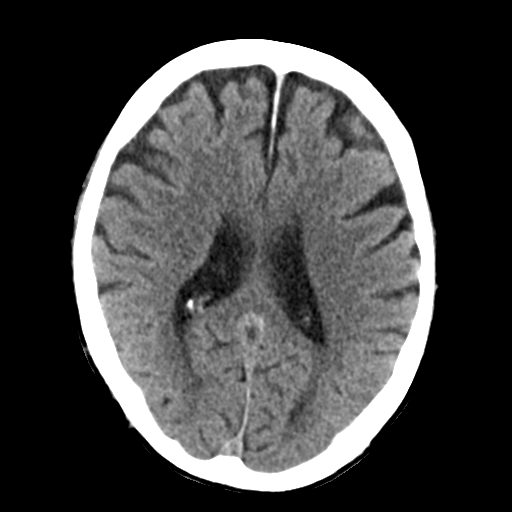
[im 19/29  brain]
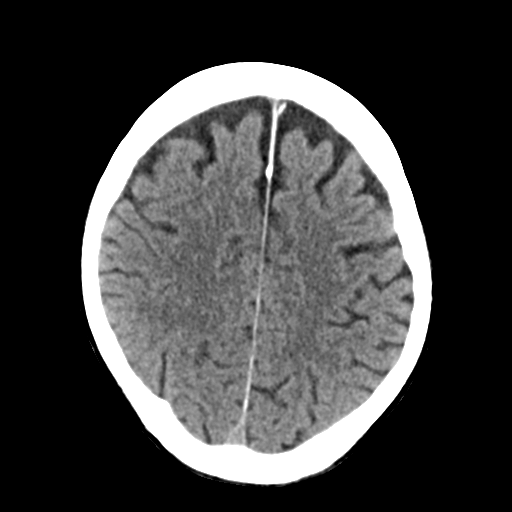
[im 22/29  brain]
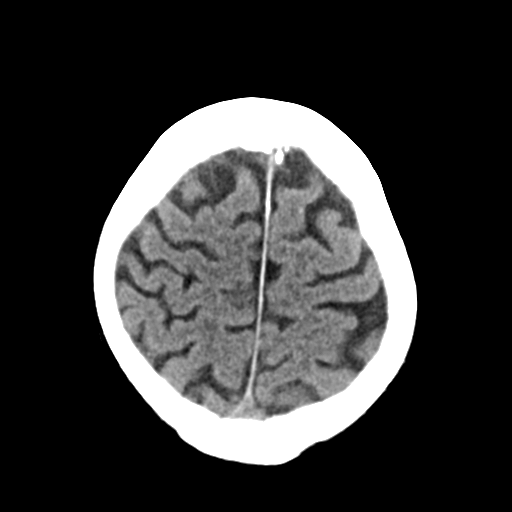
[im 24/29  brain]
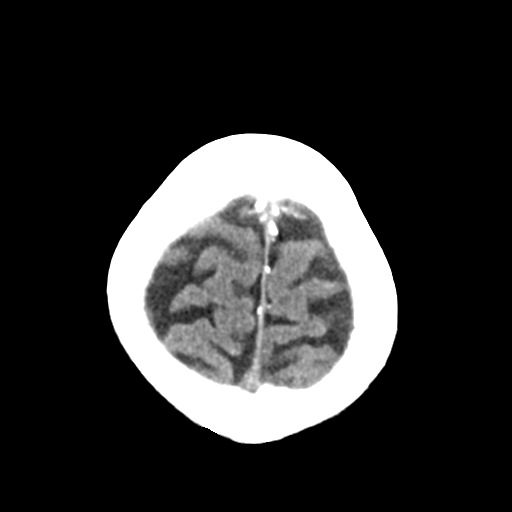
[im 24/29  bone]
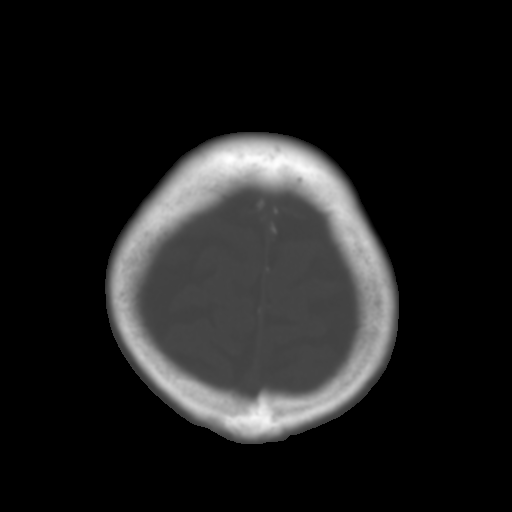
[im 27/29  brain]
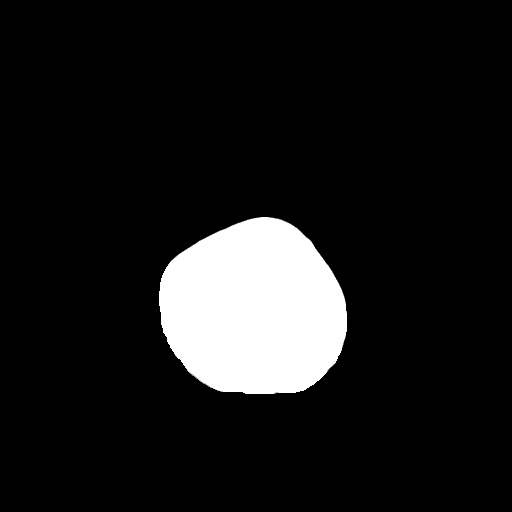

[Series 6: coronal soft tissue · coronal · 0.29mm/px · 3 of 65 slices shown]
[im 22/65  brain]
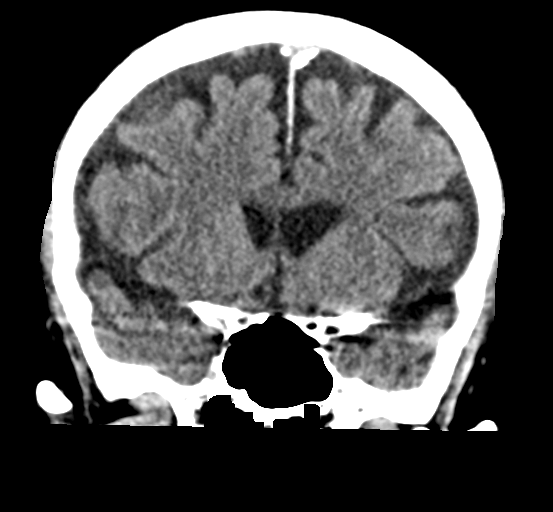
[im 29/65  brain]
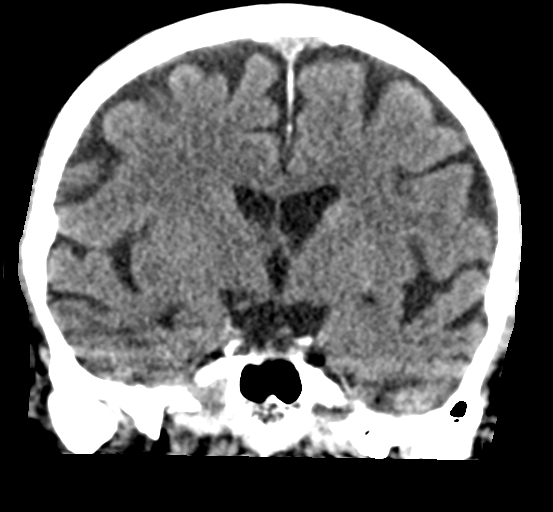
[im 36/65  brain]
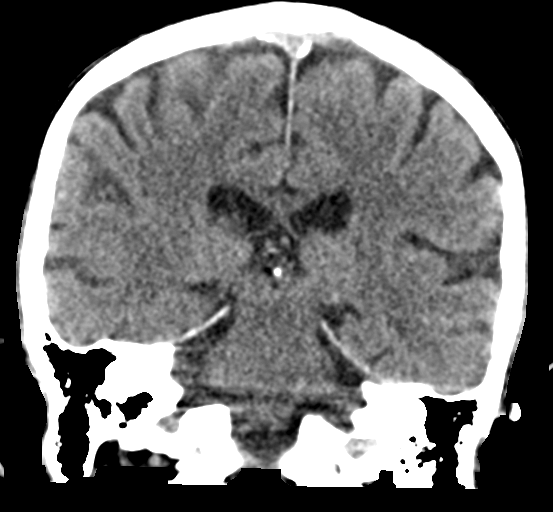

[Series 7: sagittal soft tissue · sagittal · 0.30mm/px · 3 of 54 slices shown]
[im 18/54  brain]
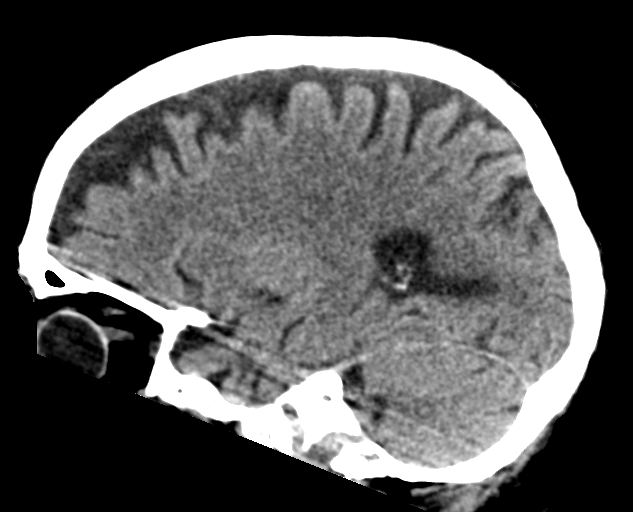
[im 27/54  brain]
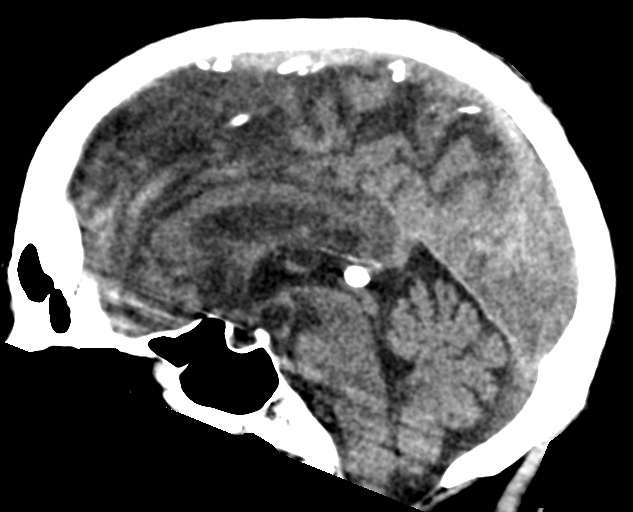
[im 36/54  brain]
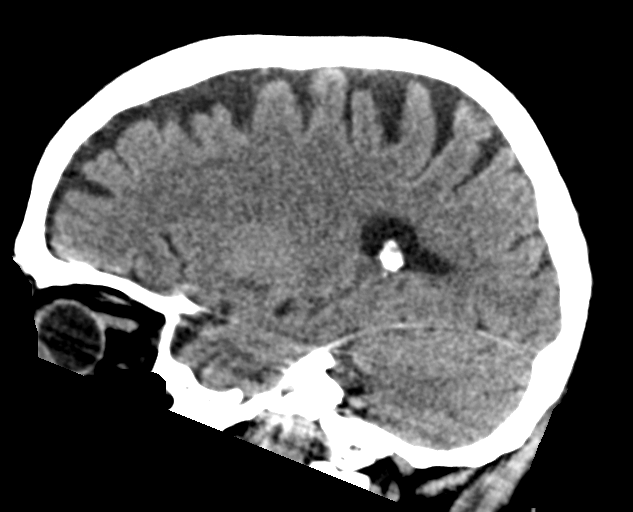

[16 of 46 positions shown; findings below may reference images not displayed]

FINDINGS: Brain: No subdural, epidural, or subarachnoid hemorrhage.
Cerebellum, brainstem, and basal cisterns are normal. Ventricles and
sulci are prominent consistent volume loss. No acute cortical
ischemia infarct. No mass effect or midline shift.

Vascular: Calcified atherosclerosis in the intracranial carotids.

Skull: Normal. Negative for fracture or focal lesion.

Sinuses/Orbits: No acute finding.

Other: None.
IMPRESSION: 1. No acute intracranial abnormalities.

## 2018-04-13 IMAGING — CR DG CHEST 2V
1 series · 2 of 2 positions shown · non-contrast
Comparison: [DATE]

CLINICAL DATA: Syncope.  History of bladder cancer.

EXAM:
CHEST - 2 VIEW

[Series 1: dg chest 2 view · 0.14mm/px · 2 of 2 slices shown]
[im 1/2]
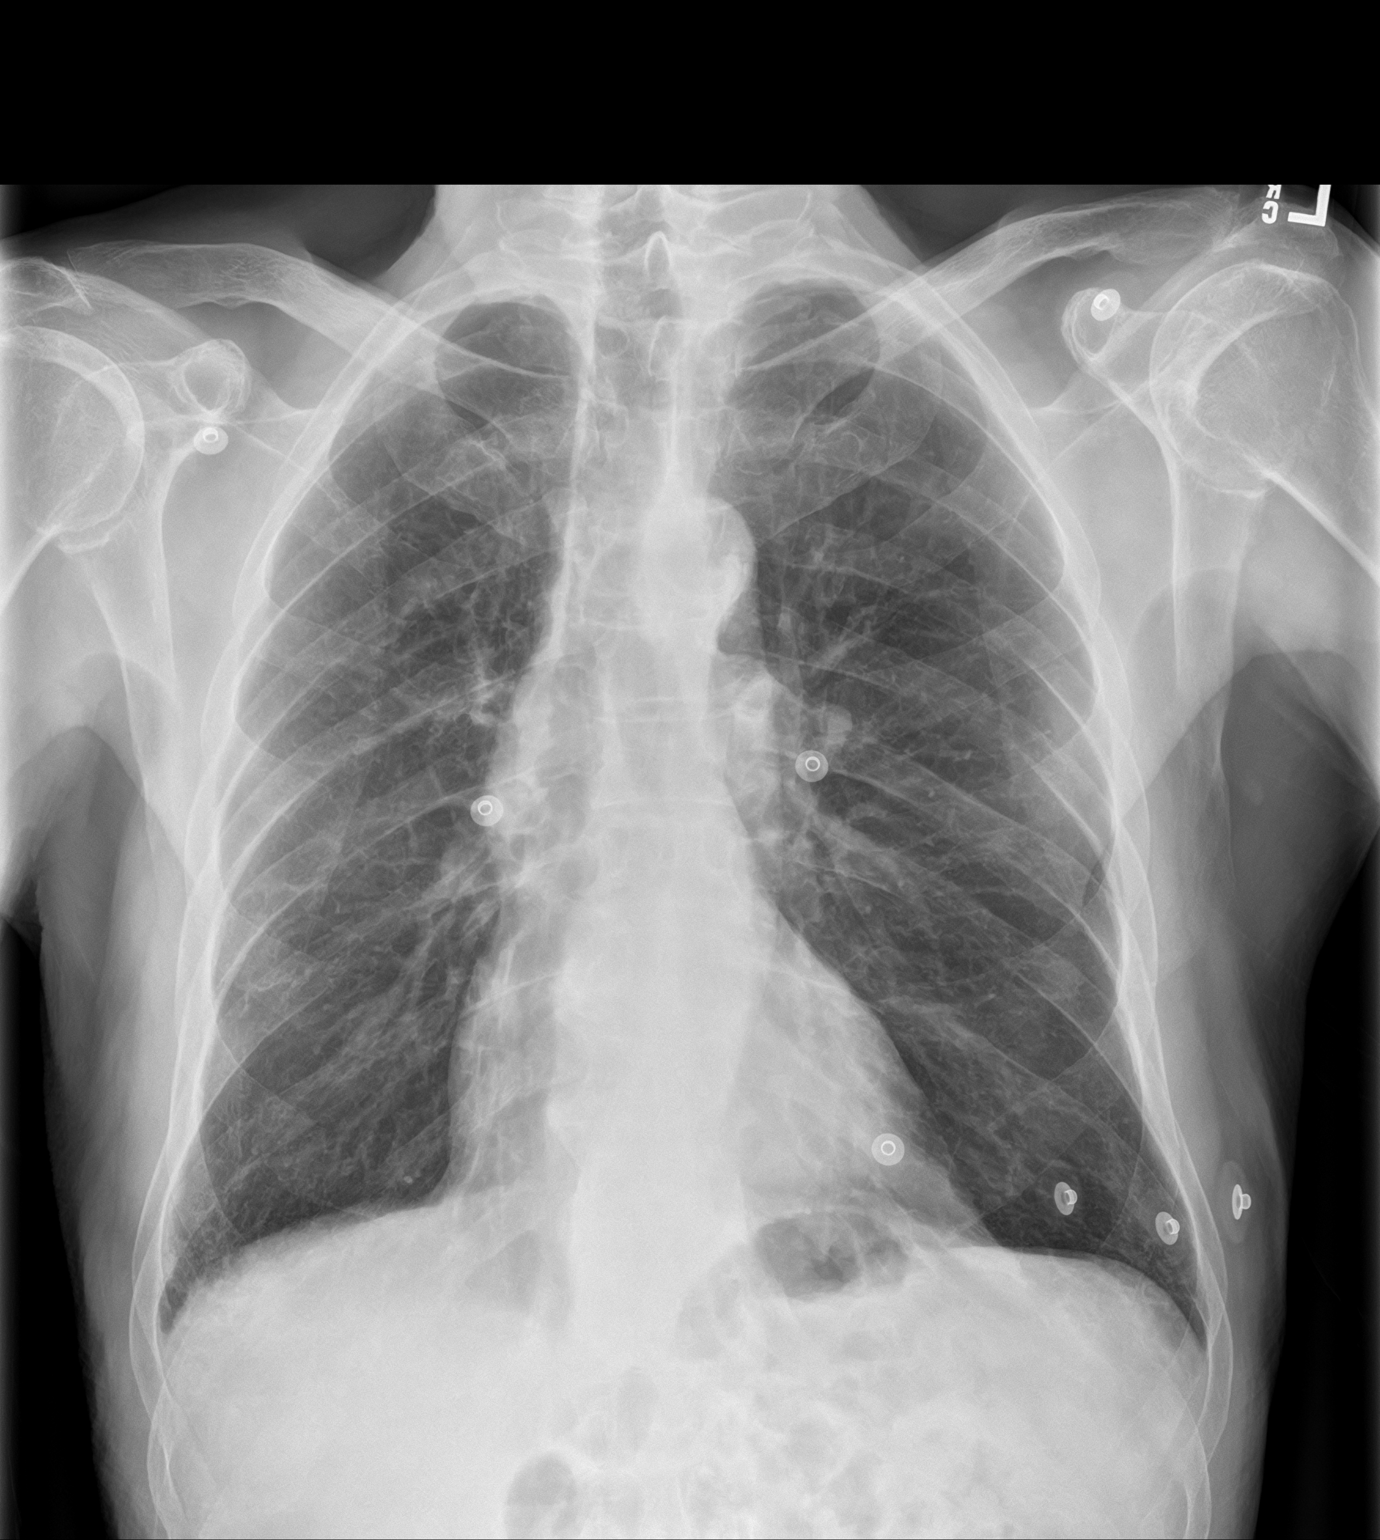
[im 2/2]
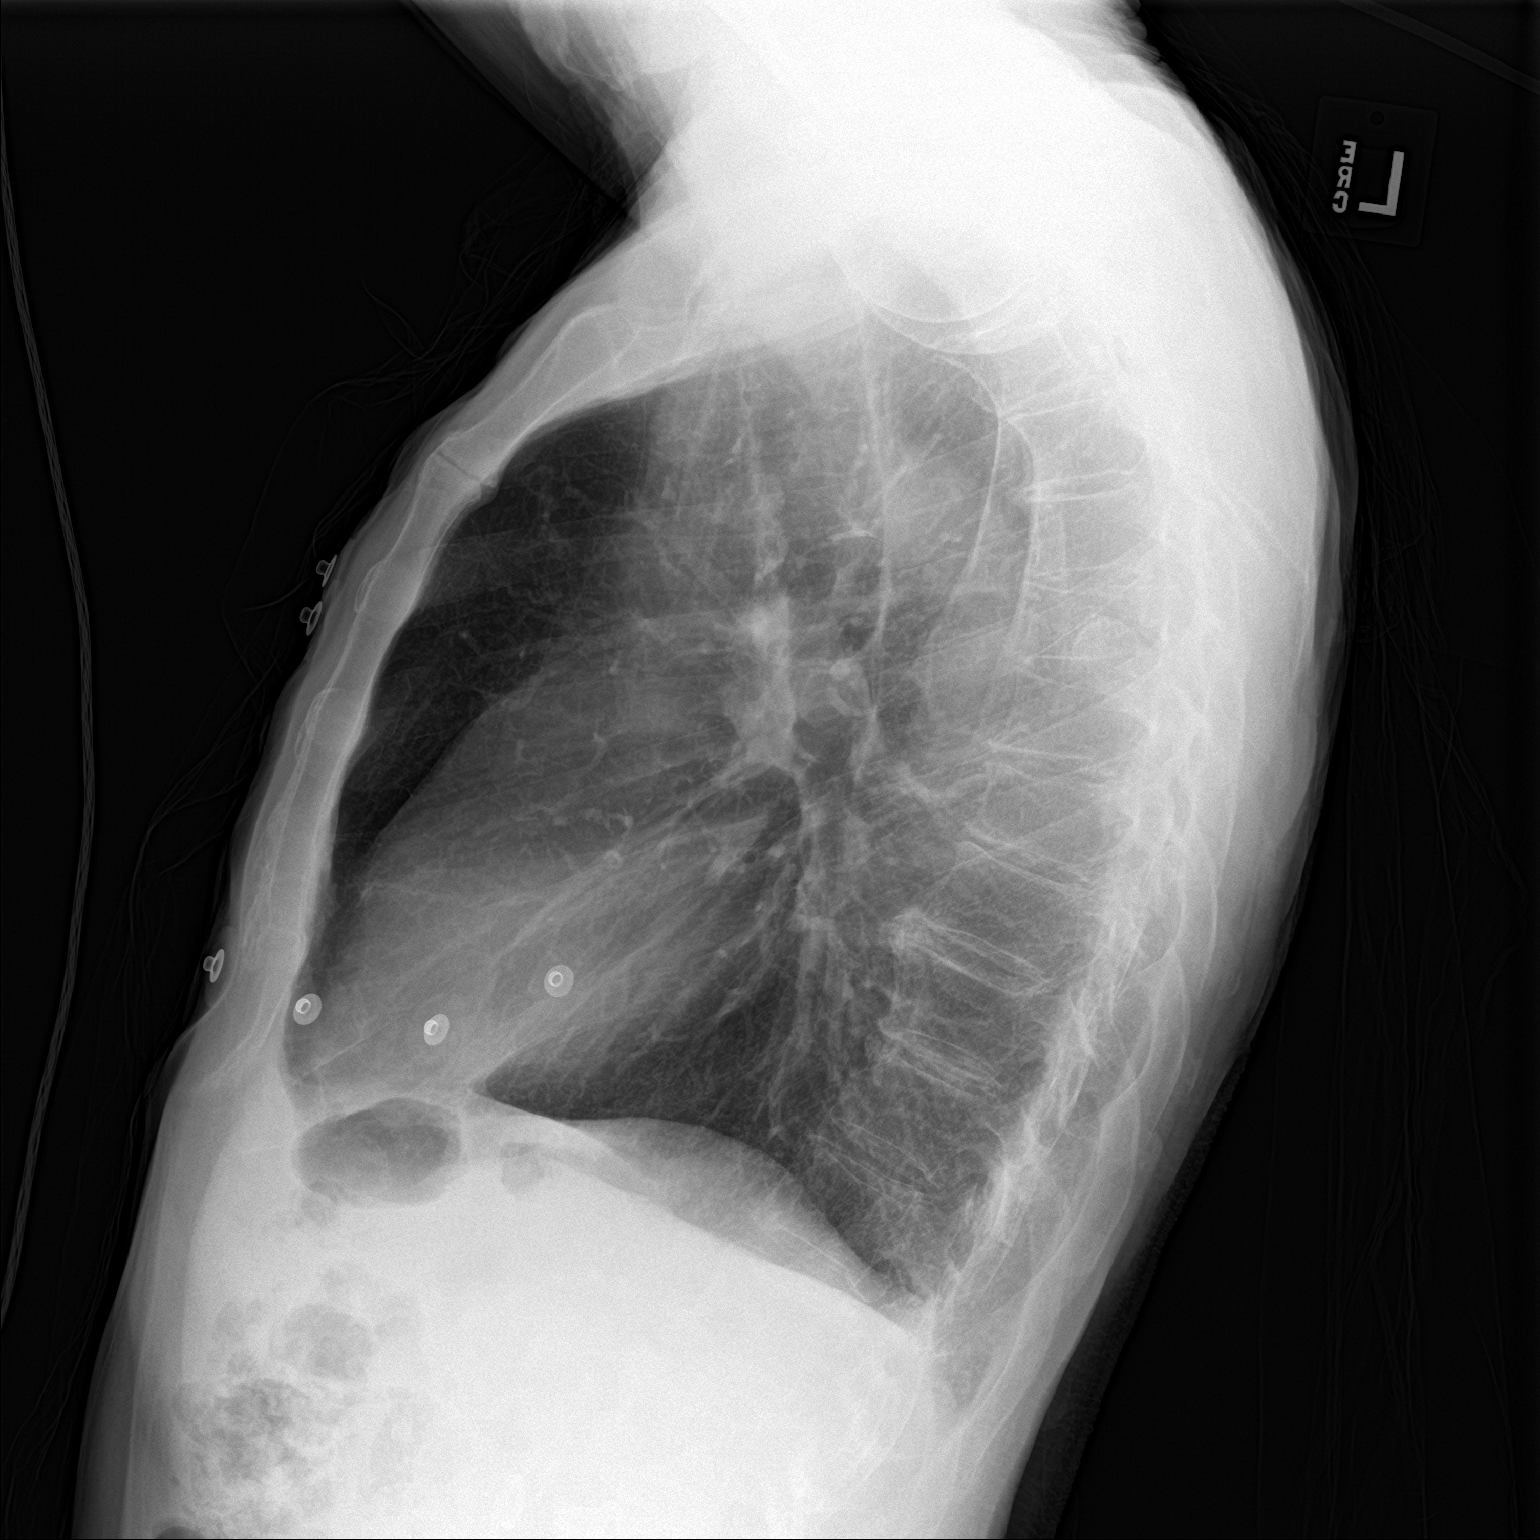

[2 of 2 positions shown; findings below may reference images not displayed]

FINDINGS: The heart, hila, and mediastinum are normal. A nodular density over
the lateral left chest may represent a nipple shadow. No other
nodules or masses are noted. No focal infiltrates. No other acute
abnormalities.
IMPRESSION: A nodular density over the lateral left chest may represent a nipple
shadow. Repeat imaging with nipple markers could confirm. No other
acute abnormalities.

## 2018-04-13 MED ORDER — INSULIN ASPART 100 UNIT/ML ~~LOC~~ SOLN: 0.0000 [IU] | Freq: Three times a day (TID) | SUBCUTANEOUS | Status: DC

## 2018-04-13 MED ORDER — ACETAMINOPHEN 325 MG PO TABS: 650.0000 mg | ORAL_TABLET | Freq: Four times a day (QID) | ORAL | Status: DC | PRN

## 2018-04-13 MED ORDER — OXYBUTYNIN CHLORIDE 5 MG PO TABS
10.0000 mg | ORAL_TABLET | Freq: Two times a day (BID) | ORAL | Status: DC
  Administered 2018-04-14: 11:00:00 10 mg via ORAL
  Filled 2018-04-13: qty 2

## 2018-04-13 MED ORDER — SODIUM CHLORIDE 0.9 % IV BOLUS
500.0000 mL | Freq: Once | INTRAVENOUS | Status: AC
  Administered 2018-04-13: 500 mL via INTRAVENOUS

## 2018-04-13 MED ORDER — ONDANSETRON HCL 4 MG PO TABS: 4.0000 mg | ORAL_TABLET | Freq: Four times a day (QID) | ORAL | Status: DC | PRN

## 2018-04-13 MED ORDER — ALBUTEROL SULFATE (2.5 MG/3ML) 0.083% IN NEBU: 2.5000 mg | INHALATION_SOLUTION | RESPIRATORY_TRACT | Status: DC | PRN

## 2018-04-13 MED ORDER — ACETAMINOPHEN 650 MG RE SUPP: 650.0000 mg | Freq: Four times a day (QID) | RECTAL | Status: DC | PRN

## 2018-04-13 MED ORDER — PREDNISOLONE ACETATE 1 % OP SUSP
1.0000 [drp] | Freq: Every day | OPHTHALMIC | Status: DC
  Filled 2018-04-13: qty 1

## 2018-04-13 MED ORDER — DOCUSATE SODIUM 100 MG PO CAPS
200.0000 mg | ORAL_CAPSULE | Freq: Two times a day (BID) | ORAL | Status: DC | PRN
  Administered 2018-04-14: 200 mg via ORAL
  Filled 2018-04-13: qty 2

## 2018-04-13 MED ORDER — HYDROCODONE-ACETAMINOPHEN 5-325 MG PO TABS: 1.0000 | ORAL_TABLET | ORAL | Status: DC | PRN

## 2018-04-13 MED ORDER — AMLODIPINE BESYLATE 5 MG PO TABS
5.0000 mg | ORAL_TABLET | Freq: Every day | ORAL | Status: DC
  Administered 2018-04-14: 5 mg via ORAL
  Filled 2018-04-13: qty 1

## 2018-04-13 MED ORDER — SODIUM CHLORIDE 0.9 % IV BOLUS
1000.0000 mL | Freq: Once | INTRAVENOUS | Status: AC
  Administered 2018-04-14: 1000 mL via INTRAVENOUS

## 2018-04-13 MED ORDER — TAMSULOSIN HCL 0.4 MG PO CAPS
0.4000 mg | ORAL_CAPSULE | Freq: Every day | ORAL | Status: DC
  Administered 2018-04-14: 11:00:00 0.4 mg via ORAL
  Filled 2018-04-13: qty 1

## 2018-04-13 MED ORDER — POLYETHYLENE GLYCOL 3350 17 G PO PACK: 17.0000 g | PACK | Freq: Every day | ORAL | Status: DC | PRN

## 2018-04-13 MED ORDER — ENOXAPARIN SODIUM 40 MG/0.4ML ~~LOC~~ SOLN: 30.0000 mg | SUBCUTANEOUS | Status: DC

## 2018-04-13 MED ORDER — SODIUM CHLORIDE 0.9% FLUSH
3.0000 mL | Freq: Two times a day (BID) | INTRAVENOUS | Status: DC
  Administered 2018-04-14: 3 mL via INTRAVENOUS

## 2018-04-13 MED ORDER — ONDANSETRON HCL 4 MG/2ML IJ SOLN: 4.0000 mg | Freq: Four times a day (QID) | INTRAMUSCULAR | Status: DC | PRN

## 2018-04-13 MED ORDER — ASPIRIN EC 81 MG PO TBEC
81.0000 mg | DELAYED_RELEASE_TABLET | Freq: Every day | ORAL | Status: DC
  Administered 2018-04-14: 11:00:00 81 mg via ORAL
  Filled 2018-04-13: qty 1

## 2018-04-13 NOTE — ED Notes (Signed)
Pt is going to medical imaging.   

## 2018-04-13 NOTE — ED Provider Notes (Signed)
[facility] Emergency Department Provider Note   ____________________________________________   First MD Initiated Contact with Patient 04/13/18 1909     (approximate)  I have reviewed the triage vital signs and the nursing notes.   HISTORY  Chief Complaint Loss of Consciousness    HPI Edward Cummings is a 84 y.o. male with a history of transitional cell carcinoma, chronic kidney disease anemia  Patient reports that tonight he was getting his haircut by family, as he was seated he then remembers is waking up on the couch.  He reports his family told him they saw him pass out.  He did not fall or get injured but he was caught in his family lowered him onto the sofa.  No pain.  No headache no neck pain.  No nausea or vomiting.  Reports he felt lightheaded right before and then remembers waking up on the couch  There is no report of any seizure activity.  Also reports a similar act occurred about a week ago as he was getting out of his truck, he felt lightheaded as though he is going to pass out but did not.  He saw his doctor and they decreased his blood pressure medicine by half.  He does report he has not been to keeping up too well with eating and drinking because he was having a little bit of stomach discomfort and occasional nausea over the last week which is now better, but he has had decreased appetite due to his cancer.  Denies previous history of passing out.  Does report he has some chronic weakness in the muscles of the left side of his face and eyelid that is due to previous problem not new.  No trouble speaking.  No weakness in arm or leg.   Past Medical History:  Diagnosis Date  . Anemia   . Cancer ([place])   . Chronic kidney disease   . Depression   . Hypertension   . 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    Patient Active Problem List   Diagnosis Date Noted  . Cancer of overlapping sites of bladder  ([place]) 02/04/2018  . Anemia due to stage 3 chronic kidney disease ([place]) 02/04/2018  . AKI (acute kidney injury) ([place]) 12/12/2017    Past Surgical History:  Procedure Laterality Date  . CYSTOSCOPY W/ RETROGRADES Bilateral 01/25/2018   Procedure: CYSTOSCOPY WITH RETROGRADE PYELOGRAM;  [name], MD;  [name];  Service: Urology;  Laterality: Bilateral;  . CYSTOSCOPY WITH STENT PLACEMENT Bilateral 01/25/2018   Procedure: CYSTOSCOPY WITH STENT PLACEMENT;  [name], MD;  [name];  Service: Urology;  Laterality: Bilateral;  . EYE SURGERY     Cornea transplants bilaterally & cataract surgery.  . TRANSURETHRAL RESECTION OF BLADDER TUMOR N/A 01/25/2018   Procedure: TRANSURETHRAL RESECTION OF BLADDER TUMOR (TURBT);  [name], MD;  [name];  Service: Urology;  [name];    Prior to Admission medications   Medication Sig Start Date End Date Taking? Authorizing Provider  amLODipine (NORVASC) 5 MG tablet Take 1 tablet (5 mg total) by mouth daily. 04/11/18   Cammie Sickle, MD  aspirin EC 81 MG tablet Take 81 mg by mouth daily.     [provider]  diphenhydrAMINE (BENADRYL) 25 MG tablet Take 25 mg by mouth daily as needed (ALLERGIES).    [provider]  docusate sodium (COLACE)  100 MG capsule Take 2 capsules (200 mg total) by mouth 2 (two) times daily as needed for mild constipation. 12/14/17   Salary, Avel Peace, MD  HYDROcodone-acetaminophen (NORCO/VICODIN) 5-325 MG tablet Take 1 tablet by mouth every 4 (four) hours as needed for moderate pain. 01/25/18   Stoioff, Ronda Fairly, MD  Multiple Vitamin (MULTIVITAMIN WITH MINERALS) TABS tablet Take 1 tablet by mouth daily. One-A-Day    [provider]  oxybutynin (DITROPAN) 5 MG tablet 1 tab tid prn frequency,urgency, bladder spasm 01/25/18   Stoioff, Ronda Fairly, MD  prednisoLONE acetate (PRED FORTE) 1 % ophthalmic suspension Place 1 drop into both  eyes daily.    [provider]  tamsulosin (FLOMAX) 0.4 MG CAPS capsule Take 0.4 mg by mouth daily.    [provider]    Allergies Patient has no known allergies.  Family History  Problem Relation Age of Onset  . Prostate cancer Neg Hx   . Kidney cancer Neg Hx   . Bladder Cancer Neg Hx     Social History Social History   Tobacco Use  . Smoking status: Former Research scientist (life sciences)  . Smokeless tobacco: Current User    Types: Chew  . Tobacco comment: Stopped approximately 10 years ago.  Substance Use Topics  . Alcohol use: Yes    Alcohol/week: 2.0 standard drinks    Types: 2 Cans of beer per week    Comment: Daily  . Drug use: Never    Review of Systems Constitutional: No fever/chills but feeling fatigued for about a week Eyes: No visual changes. ENT: No sore throat. Cardiovascular: Denies chest pain. Respiratory: Denies shortness of breath. Gastrointestinal: No abdominal pain.  Had some abdominal discomfort nausea earlier last week which is better now Genitourinary: Negative for dysuria. Musculoskeletal: Negative for back pain. Skin: Negative for rash. Neurological: Negative for headaches, areas of focal weakness or numbness.    ____________________________________________   PHYSICAL EXAM:  VITAL SIGNS: ED Triage Vitals  Enc Vitals Group     BP 04/13/18 1913 (!) 143/92     Pulse Rate 04/13/18 1913 97     Resp 04/13/18 1913 16     Temp 04/13/18 1913 98.6 F (37 C)     Temp Source 04/13/18 1913 Oral     SpO2 04/13/18 1913 100 %     Weight 04/13/18 1908 120 lb (54.4 kg)     Height 04/13/18 1908 5\' 8"  (1.727 m)     Head Circumference --      Peak Flow --      Pain Score 04/13/18 1908 0     Pain Loc --      Pain Edu? --      Excl. in [place]? --     Constitutional: Alert and oriented. Well appearing and in no acute distress.  He is very pleasant, does appear somewhat pale in complexion but in no distress. Eyes: Conjunctivae are normal. Head:  Atraumatic. Nose: No congestion/rhinnorhea. Mouth/Throat: Mucous membranes are lightly dry. Neck: No stridor.  Cardiovascular: Normal rate, regular rhythm though noted to have frequent PVCs. Grossly normal heart sounds.  Good peripheral circulation. Respiratory: Normal respiratory effort.  No retractions. Lungs CTAB. Gastrointestinal: Soft and nontender. No distention. Musculoskeletal: No lower extremity tenderness nor edema. Neurologic:  Normal speech and language. No gross focal neurologic deficits are appreciated.  Skin:  Skin is warm, dry and intact. No rash noted. Psychiatric: Mood and affect are normal. Speech and behavior are normal.  ____________________________________________   LABS (all labs  ordered are listed, but only abnormal results are displayed)  Labs Reviewed  CBC - Abnormal; Notable for the following components:      Result Value   RBC 2.77 (*)    Hemoglobin 8.7 (*)    HCT 26.2 (*)    All other components within normal limits  COMPREHENSIVE METABOLIC PANEL - Abnormal; Notable for the following components:   BUN 26 (*)    Creatinine, Ser 1.91 (*)    Calcium 8.7 (*)    Total Protein 6.4 (*)    GFR calc non Af Amer 31 (*)    GFR calc Af Amer 36 (*)    All other components within normal limits  TROPONIN I  URINALYSIS, COMPLETE (UACMP) WITH MICROSCOPIC   ____________________________________________  EKG  Reviewed enterotomy at 1920 Heart rate 100 QRS 110 QTc 479 sinus tachycardia, occasional ectopic beats including PACs.  Review of telemetry also having occasional PVC.  No runs of V. tach denoted.  No evidence of acute T wave abnormality suggest ischemia. ____________________________________________  RADIOLOGY  Dg Chest 2 View  Result Date: 04/13/2018 CLINICAL DATA:  Syncope.  History of bladder cancer. EXAM: CHEST - 2 VIEW COMPARISON:  July 01, 2008 FINDINGS: The heart, hila, and mediastinum are normal. A nodular density over the lateral left chest  may represent a nipple shadow. No other nodules or masses are noted. No focal infiltrates. No other acute abnormalities. IMPRESSION: A nodular density over the lateral left chest may represent a nipple shadow. Repeat imaging with nipple markers could confirm. No other acute abnormalities. Electronically Signed   By: Dorise Bullion III M.D   On: 04/13/2018 19:47   Ct Head Wo Contrast  Result Date: 04/13/2018 CLINICAL DATA:  Syncope. EXAM: CT HEAD WITHOUT CONTRAST TECHNIQUE: Contiguous axial images were obtained from the base of the skull through the vertex without intravenous contrast. COMPARISON:  CT scan July 01, 2008 FINDINGS: Brain: No subdural, epidural, or subarachnoid hemorrhage. Cerebellum, brainstem, and basal cisterns are normal. Ventricles and sulci are prominent consistent volume loss. No acute cortical ischemia infarct. No mass effect or midline shift. Vascular: Calcified atherosclerosis in the intracranial carotids. Skull: Normal. Negative for fracture or focal lesion. Sinuses/Orbits: No acute finding. Other: None. IMPRESSION: 1. No acute intracranial abnormalities. Electronically Signed   By: Dorise Bullion III M.D   On: 04/13/2018 19:51     ____________________________________________   PROCEDURES  Procedure(s) performed: None  Procedures  Critical Care performed: No  ____________________________________________   INITIAL IMPRESSION / ASSESSMENT AND PLAN / ED COURSE  Pertinent labs & imaging results that were available during my care of the patient were reviewed by me and considered in my medical decision making (see chart for details).   Syncope, witnessed.  No fall or trauma.  Known cancer, with recent fatigue and weakness.  Denies acute cardiac or pulmonary symptoms such as chest pain or shortness of breath.  Alert with normal mental status, some slight weakness of the musculature around the left eye is chronic and nonacute according to patient.  No focal  abnormalities on examination.  Because of his history of known cancer that is reported by patient has metastatic I will proceed with CT of the head to evaluate for gross abnormality tumor mass or bleed.  Additionally will monitor carefully on telemetry, check blood count, continue to monitor the patient and further evaluate for etiology of syncope.  Will start with gentle hydration at this time.     ----------------------------------------- 8:34 PM on 04/13/2018 -----------------------------------------  Patient resting comfortably, normal vital signs.  Due to the patient's syncope, associated occasional PACs and PVCs as well as his low hematocrit patient fails the [facility] syncope rule.  Discussed with the patient, will admit for further evaluation for syncope.  He is awake alert asymptomatic well-appearing at this time.  Patient has family at bedside agreeable and understanding of plan for admission for further work-up. ____________________________________________   FINAL CLINICAL IMPRESSION(S) / ED DIAGNOSES  Final diagnoses:  Syncope and collapse        Note:  This document was prepared using Dragon voice recognition software and may include unintentional dictation errors       Delman Kitten, MD 04/13/18 2034

## 2018-04-13 NOTE — ED Notes (Signed)
Attempted to call report and nurse was unavailable to take report. Stated she would called back to ext [phone]

## 2018-04-13 NOTE — Progress Notes (Signed)
Advance care planning  Purpose of Encounter Syncope, bladder cancer and CODE STATUS discussion  Parties in Attendance Patient, wife and son  Patients Decisional capacity Alert and oriented.  Able to make medical decisions.  Patient does not have a documented healthcare power of attorney.  He tells me he wants his youngest son to be his healthcare power of attorney but he is trying to have all of his family together at the same time to make that decision.  Discussed regarding CODE STATUS.  Patient was full code till now and after discussing he has changed his CODE STATUS to DNR/DNI.  Orders entered.  Wife and son at bedside when patient made the decision.   Time spent - 17 minutes

## 2018-04-13 NOTE — ED Triage Notes (Signed)
EMS stated that pt was sitting down in a chair and he passed out. Family stated that it lasted for about 5 mins. On arrival to ED, pt is A&OX4, pt stated that he has been feeling dizzy and recently his pcp changed his BP medication(cut the dose in half). Pt denies any pain at this time.

## 2018-04-13 NOTE — ED Notes (Signed)
Pt given a boxed lunch and cup of iced water ok'd by Dr.Quale

## 2018-04-13 NOTE — ED Notes (Signed)
Edward Cummings

## 2018-04-13 NOTE — H&P (Signed)
[facility] NAME: Edward Cummings    MR#:  [id number]  DATE OF BIRTH:  09/26/1932  DATE OF ADMISSION:  04/13/2018  PRIMARY CARE PHYSICIAN: [name], MD   REQUESTING/REFERRING PHYSICIAN: Dr. Jacqualine Code  CHIEF COMPLAINT:   Chief Complaint  Patient presents with  . Loss of Consciousness    HISTORY OF PRESENT ILLNESS:  Edward Cummings  is a 84 y.o. male with a known history of bladder cancer, anemia of chronic disease, hypertension, weight loss presents to the emergency room after having an episode of syncope while sitting in a chair.  Patient was passed out for 3 to 4 minutes.  By time EMS arrived he was back to baseline.  Here in the emergency room he has been found to be dehydrated with some acute kidney injury and anemia and is being admitted to the hospitalist service.  Patient recently had dizziness and presyncope and his blood pressure medications were cut in half by his physician.  He used to drink alcohol heavily but quit 3 months back when he was diagnosed with bladder cancer.  He had one beer earlier in the day today but did not eat or drink well.  PAST MEDICAL HISTORY:   Past Medical History:  Diagnosis Date  . Anemia   . Cancer ([place])   . Chronic kidney disease   . Depression   . Hypertension   . Neuromuscular disorder ([place])    Nerve damage to left face/eye since around 2002.    PAST SURGICAL HISTORY:   Past Surgical History:  Procedure Laterality Date  . CYSTOSCOPY W/ RETROGRADES Bilateral 01/25/2018   Procedure: CYSTOSCOPY WITH RETROGRADE PYELOGRAM;  [name], MD;  [name];  Service: Urology;  Laterality: Bilateral;  . CYSTOSCOPY WITH STENT PLACEMENT Bilateral 01/25/2018   Procedure: CYSTOSCOPY WITH STENT PLACEMENT;  [name], MD;  [name];  Service: Urology;  Laterality: Bilateral;  . EYE SURGERY     Cornea transplants bilaterally & cataract surgery.  .  TRANSURETHRAL RESECTION OF BLADDER TUMOR N/A 01/25/2018   Procedure: TRANSURETHRAL RESECTION OF BLADDER TUMOR (TURBT);  [name], MD;  [name];  Service: Urology;  [name];    SOCIAL HISTORY:   Social History   Tobacco Use  . Smoking status: Former Research scientist (life sciences)  . Smokeless tobacco: Current User    Types: Chew  . Tobacco comment: Stopped approximately 10 years ago.  Substance Use Topics  . Alcohol use: Yes    Alcohol/week: 2.0 standard drinks    Types: 2 Cans of beer per week    Comment: Daily    FAMILY HISTORY:   Family History  Problem Relation Age of Onset  . Prostate cancer Neg Hx   . Kidney cancer Neg Hx   . Bladder Cancer Neg Hx     DRUG ALLERGIES:  No Known Allergies  REVIEW OF SYSTEMS:   Review of Systems  Constitutional: Positive for malaise/fatigue and weight loss. Negative for chills and fever.  HENT: Negative for hearing loss and nosebleeds.   Eyes: Negative for blurred vision, double vision and pain.  Respiratory: Negative for cough, hemoptysis, sputum production, shortness of breath and wheezing.   Cardiovascular: Negative for chest pain, palpitations, orthopnea and leg swelling.  Gastrointestinal: Negative for abdominal pain, constipation, diarrhea, nausea and vomiting.  Genitourinary: Positive for hematuria. Negative for dysuria.  Musculoskeletal: Negative for back pain, falls and myalgias.  Skin: Negative for rash.  Neurological: Positive for dizziness, loss of consciousness and weakness. Negative for tremors, sensory change, speech change, focal weakness, seizures and headaches.  Endo/Heme/Allergies: Does not bruise/bleed easily.  Psychiatric/Behavioral: Negative for depression and memory loss. The patient is not nervous/anxious.     MEDICATIONS AT HOME:   Prior to Admission medications   Medication Sig Start Date End Date Taking? Authorizing Provider  amLODipine (NORVASC) 5 MG tablet Take 1 tablet (5 mg total) by  mouth daily. 04/11/18   Cammie Sickle, MD  aspirin EC 81 MG tablet Take 81 mg by mouth daily.     [provider]  diphenhydrAMINE (BENADRYL) 25 MG tablet Take 25 mg by mouth daily as needed (ALLERGIES).    [provider]  docusate sodium (COLACE) 100 MG capsule Take 2 capsules (200 mg total) by mouth 2 (two) times daily as needed for mild constipation. 12/14/17   Salary, Avel Peace, MD  HYDROcodone-acetaminophen (NORCO/VICODIN) 5-325 MG tablet Take 1 tablet by mouth every 4 (four) hours as needed for moderate pain. 01/25/18   Stoioff, Ronda Fairly, MD  Multiple Vitamin (MULTIVITAMIN WITH MINERALS) TABS tablet Take 1 tablet by mouth daily. One-A-Day    [provider]  oxybutynin (DITROPAN) 5 MG tablet 1 tab tid prn frequency,urgency, bladder spasm 01/25/18   Stoioff, Ronda Fairly, MD  prednisoLONE acetate (PRED FORTE) 1 % ophthalmic suspension Place 1 drop into both eyes daily.    [provider]  tamsulosin (FLOMAX) 0.4 MG CAPS capsule Take 0.4 mg by mouth daily.    [provider]     VITAL SIGNS:  Blood pressure 138/67, pulse 88, temperature 98.6 F (37 C), temperature source Oral, resp. rate 19, height 5\' 8"  (1.727 m), weight 54.4 kg, SpO2 100 %.  PHYSICAL EXAMINATION:  Physical Exam  GENERAL:  84 y.o.-year-old patient lying in the bed with no acute distress.  EYES: Pupils equal, round, reactive to light and accommodation. No scleral icterus. Extraocular muscles intact.  HEENT: Head atraumatic, normocephalic. Oropharynx and nasopharynx clear. No oropharyngeal erythema, moist oral mucosa  NECK:  Supple, no jugular venous distention. No thyroid enlargement, no tenderness.  LUNGS: Normal breath sounds bilaterally, no wheezing, rales, rhonchi. No use of accessory muscles of respiration.  CARDIOVASCULAR: S1, S2 normal. No murmurs, rubs, or gallops.  ABDOMEN: Soft, nontender, nondistended. Bowel sounds present. No organomegaly or mass.  EXTREMITIES: No  pedal edema, cyanosis, or clubbing. + 2 pedal & radial pulses b/l.   NEUROLOGIC: Cranial nerves II through XII are intact. No focal Motor or sensory deficits appreciated b/l PSYCHIATRIC: The patient is alert and oriented x 3. Good affect.  SKIN: No obvious rash, lesion, or ulcer.   Left ptosis  LABORATORY PANEL:   CBC Recent Labs  Lab 04/13/18 1929  WBC 5.5  HGB 8.7*  HCT 26.2*  PLT 168   ------------------------------------------------------------------------------------------------------------------  Chemistries  Recent Labs  Lab 04/13/18 1929  NA 140  K 3.9  CL 111  CO2 22  GLUCOSE 97  BUN 26*  CREATININE 1.91*  CALCIUM 8.7*  AST 20  ALT 14  ALKPHOS 66  BILITOT 0.6   ------------------------------------------------------------------------------------------------------------------  Cardiac Enzymes Recent Labs  Lab 04/13/18 1929  TROPONINI <0.03   ------------------------------------------------------------------------------------------------------------------  RADIOLOGY:  Dg Chest 2 View  Result Date: 04/13/2018 CLINICAL DATA:  Syncope.  History of bladder cancer. EXAM: CHEST - 2 VIEW COMPARISON:  July 01, 2008 FINDINGS: The heart, hila, and mediastinum are normal. A nodular density over the lateral left chest may represent a  nipple shadow. No other nodules or masses are noted. No focal infiltrates. No other acute abnormalities. IMPRESSION: A nodular density over the lateral left chest may represent a nipple shadow. Repeat imaging with nipple markers could confirm. No other acute abnormalities. Electronically Signed   By: Dorise Bullion III M.D   On: 04/13/2018 19:47   Ct Head Wo Contrast  Result Date: 04/13/2018 CLINICAL DATA:  Syncope. EXAM: CT HEAD WITHOUT CONTRAST TECHNIQUE: Contiguous axial images were obtained from the base of the skull through the vertex without intravenous contrast. COMPARISON:  CT scan July 01, 2008 FINDINGS: Brain: No subdural,  epidural, or subarachnoid hemorrhage. Cerebellum, brainstem, and basal cisterns are normal. Ventricles and sulci are prominent consistent volume loss. No acute cortical ischemia infarct. No mass effect or midline shift. Vascular: Calcified atherosclerosis in the intracranial carotids. Skull: Normal. Negative for fracture or focal lesion. Sinuses/Orbits: No acute finding. Other: None. IMPRESSION: 1. No acute intracranial abnormalities. Electronically Signed   By: Dorise Bullion III M.D   On: 04/13/2018 19:51     IMPRESSION AND PLAN:   * Syncope likely due to dehydration or alcohol Patient admission to medical floor with telemetry monitoring.  Repeat troponin.  Will give normal saline 1 L bolus.  Orthostats.  No need for echocardiogram  *Acute kidney injury over CKD stage III.  Repeat labs in the morning.  IV fluids.  *Bladder cancer.  Follows with Dr. Bradd Canary  *Hypertension.  Continue Norvasc  *Anemia of chronic disease.  Is getting IV iron infusions at the cancer center regularly  DVT prophylaxis with Lovenox  All the records are reviewed and case discussed with ED provider. Management plans discussed with the patient, family and they are in agreement.  CODE STATUS: DNR  TOTAL TIME TAKING CARE OF THIS PATIENT: 40 minutes.   Neita Carp M.D on 04/13/2018 at 9:13 PM  Between 7am to 6pm - Pager - [phone]  After 6pm go to [email or web address] - password [email or web address] [facility]  Office  [phone]  CC: Primary care physician; [name], MD  Note: This dictation was prepared with Dragon dictation along with smaller phrase technology. Any transcriptional errors that result from this process are unintentional.

## 2018-04-14 ENCOUNTER — Other Ambulatory Visit: Payer: Self-pay

## 2018-04-14 DIAGNOSIS — R55 Syncope and collapse: Secondary | ICD-10-CM | POA: Diagnosis not present

## 2018-04-14 DIAGNOSIS — E860 Dehydration: Secondary | ICD-10-CM | POA: Diagnosis not present

## 2018-04-14 DIAGNOSIS — C679 Malignant neoplasm of bladder, unspecified: Secondary | ICD-10-CM | POA: Diagnosis not present

## 2018-04-14 DIAGNOSIS — N179 Acute kidney failure, unspecified: Secondary | ICD-10-CM | POA: Diagnosis not present

## 2018-04-14 LAB — CBC
HEMATOCRIT: 24.9 % — AB (ref 39.0–52.0)
Hemoglobin: 8.4 g/dL — ABNORMAL LOW (ref 13.0–17.0)
MCH: 31.8 pg (ref 26.0–34.0)
MCHC: 33.7 g/dL (ref 30.0–36.0)
MCV: 94.3 fL (ref 80.0–100.0)
NRBC: 0 % (ref 0.0–0.2)
PLATELETS: 173 10*3/uL (ref 150–400)
RBC: 2.64 MIL/uL — AB (ref 4.22–5.81)
RDW: 13.2 % (ref 11.5–15.5)
WBC: 4.9 10*3/uL (ref 4.0–10.5)

## 2018-04-14 LAB — TROPONIN I

## 2018-04-14 LAB — BASIC METABOLIC PANEL
Anion gap: 4 — ABNORMAL LOW (ref 5–15)
BUN: 24 mg/dL — ABNORMAL HIGH (ref 8–23)
CO2: 25 mmol/L (ref 22–32)
Calcium: 8.4 mg/dL — ABNORMAL LOW (ref 8.9–10.3)
Chloride: 115 mmol/L — ABNORMAL HIGH (ref 98–111)
Creatinine, Ser: 1.68 mg/dL — ABNORMAL HIGH (ref 0.61–1.24)
GFR calc Af Amer: 42 mL/min — ABNORMAL LOW (ref >60)
GFR calc non Af Amer: 36 mL/min — ABNORMAL LOW (ref >60)
Glucose, Bld: 81 mg/dL (ref 70–99)
Potassium: 3.8 mmol/L (ref 3.5–5.1)
SODIUM: 144 mmol/L (ref 135–145)

## 2018-04-14 LAB — HEMOGLOBIN A1C
HEMOGLOBIN A1C: 4.5 % — AB (ref 4.8–5.6)
Mean Plasma Glucose: 82.45 mg/dL

## 2018-04-14 LAB — GLUCOSE, CAPILLARY: Glucose-Capillary: 81 mg/dL (ref 70–99)

## 2018-04-14 NOTE — Discharge Summary (Signed)
[facility] [place] NAME: Edward Cummings    MR#:  [id number]  DATE OF BIRTH:  14-Mar-1933  DATE OF ADMISSION:  04/13/2018   ADMITTING PHYSICIAN: Hillary Bow, MD  DATE OF DISCHARGE: 04/14/2018 11:35 AM  PRIMARY CARE PHYSICIAN: [name], MD   ADMISSION DIAGNOSIS:   Syncope and collapse [R55]  DISCHARGE DIAGNOSIS:   Active Problems:   Syncope   SECONDARY DIAGNOSIS:   Past Medical History:  Diagnosis Date  . Anemia   . Cancer ([place])   . Chronic kidney disease   . Depression   . Hypertension   . Neuromuscular disorder ([place])    Nerve damage to left face/eye since around 2002.    HOSPITAL COURSE:   84 year old male with past medical history significant for bladder cancer, CKD, hypertension who presents to hospital secondary to a near syncopal episode.  1.  Near syncope-secondary to dehydration.  Patient said he had worked all day without eating or drinking and then almost passed out. -He still remembers when he was out how he was moved to the self and then got back up within few minutes. -Denies any cardiac symptoms. -Improved with IV fluids.  No further symptoms.  Ambulated well with therapy. -Advised to cut down alcohol  2.  ARF on CKD-Baseline creatinine seems to be around 2, creatinine is now at 1.6 with IV fluids.  No further changes.  Outpatient follow-up recommended  3.  Hypertension-Norvasc  4.  BPH-Flomax  Patient is being discharged home today  DISCHARGE CONDITIONS:   Guarded CONSULTS OBTAINED:   None  DRUG ALLERGIES:   No Known Allergies DISCHARGE MEDICATIONS:   Allergies as of 04/14/2018   No Known Allergies     Medication List    TAKE these medications   amLODipine 5 MG tablet Commonly known as:  NORVASC Take 1 tablet (5 mg total) by mouth daily.   aspirin EC 81 MG tablet Take 81 mg by mouth daily.   diphenhydrAMINE 25 MG tablet Commonly known as:  BENADRYL Take 25 mg by mouth daily  as needed (ALLERGIES).   docusate sodium 100 MG capsule Commonly known as:  COLACE Take 2 capsules (200 mg total) by mouth 2 (two) times daily as needed for mild constipation. What changed:    how much to take  when to take this   HYDROcodone-acetaminophen 5-325 MG tablet Commonly known as:  NORCO/VICODIN Take 1 tablet by mouth every 4 (four) hours as needed for moderate pain.   multivitamin with minerals Tabs tablet Take 1 tablet by mouth daily. One-A-Day   oxybutynin 5 MG tablet Commonly known as:  DITROPAN 1 tab tid prn frequency,urgency, bladder spasm What changed:    how much to take  how to take this  when to take this  additional instructions   prednisoLONE acetate 1 % ophthalmic suspension Commonly known as:  PRED FORTE Place 1 drop into both eyes daily.   tamsulosin 0.4 MG Caps capsule Commonly known as:  FLOMAX Take 0.4 mg by mouth at bedtime.        DISCHARGE INSTRUCTIONS:   1.  PCP follow-up in 1 to 2 weeks 2.  Oncology follow-up as prior scheduled  DIET:   Cardiac diet  ACTIVITY:   Activity as tolerated  OXYGEN:   Home Oxygen: No.  Oxygen Delivery: room air  DISCHARGE LOCATION:   home   If you experience worsening of your admission symptoms, develop shortness of breath, life threatening emergency,  suicidal or homicidal thoughts you must seek medical attention immediately by calling 911 or calling your MD immediately  if symptoms less severe.  You Must read complete instructions/literature along with all the possible adverse reactions/side effects for all the Medicines you take and that have been prescribed to you. Take any new Medicines after you have completely understood and accpet all the possible adverse reactions/side effects.   Please note  You were cared for by a hospitalist during your hospital stay. If you have any questions about your discharge medications or the care you received while you were in the hospital after you  are discharged, you can call the unit and asked to speak with the hospitalist on call if the hospitalist that took care of you is not available. Once you are discharged, your primary care physician will handle any further medical issues. Please note that NO REFILLS for any discharge medications will be authorized once you are discharged, as it is imperative that you return to your primary care physician (or establish a relationship with a primary care physician if you do not have one) for your aftercare needs so that they can reassess your need for medications and monitor your lab values.    On the day of Discharge:  VITAL SIGNS:   Blood pressure 120/68, pulse 67, temperature 97.7 F (36.5 C), temperature source Oral, resp. rate 16, height 5\' 8"  (1.727 m), weight 56.2 kg, SpO2 99 %.  PHYSICAL EXAMINATION:    GENERAL:  84 y.o.-year-old elderly patient lying in the bed with no acute distress.  EYES: Pupils equal, round, reactive to light and accommodation. No scleral icterus. Extraocular muscles intact.  Ptosis of the left eye noted due to injury. HEENT: Head atraumatic, normocephalic. Oropharynx and nasopharynx clear.  NECK:  Supple, no jugular venous distention. No thyroid enlargement, no tenderness.  LUNGS: Normal breath sounds bilaterally, no wheezing, rales,rhonchi or crepitation. No use of accessory muscles of respiration.  CARDIOVASCULAR: S1, S2 normal. No  rubs, or gallops. 2/6 systolic murmur is present. ABDOMEN: Soft, non-tender, non-distended. Bowel sounds present. No organomegaly or mass.  EXTREMITIES: No pedal edema, cyanosis, or clubbing.  NEUROLOGIC: Cranial nerves II through XII are intact. Muscle strength 5/5 in all extremities. Sensation intact. Gait not checked.  PSYCHIATRIC: The patient is alert and oriented x 3.  SKIN: No obvious rash, lesion, or ulcer.   DATA REVIEW:   CBC Recent Labs  Lab 04/14/18 0436  WBC 4.9  HGB 8.4*  HCT 24.9*  PLT 173    Chemistries    Recent Labs  Lab 04/13/18 1929 04/14/18 0436  NA 140 144  K 3.9 3.8  CL 111 115*  CO2 22 25  GLUCOSE 97 81  BUN 26* 24*  CREATININE 1.91* 1.68*  CALCIUM 8.7* 8.4*  AST 20  --   ALT 14  --   ALKPHOS 66  --   BILITOT 0.6  --      Microbiology Results  Results for orders placed or performed during the hospital encounter of 01/17/18  Urine culture     Status: Abnormal   Collection Time: 01/17/18  9:43 AM  Result Value Ref Range Status   Specimen Description   Final    URINE, CLEAN CATCH Performed at [facility], [address], [place], [place] [address]    Special Requests   Final    NONE Performed at [facility], [address] [place], [place] [address]    Culture [place]  RECOLLECTION (A)  Final   Report Status 01/18/2018 FINAL  Final    RADIOLOGY:  Dg Chest 2 View  Result Date: 04/13/2018 CLINICAL DATA:  Syncope.  History of bladder cancer. EXAM: CHEST - 2 VIEW COMPARISON:  July 01, 2008 FINDINGS: The heart, hila, and mediastinum are normal. A nodular density over the lateral left chest may represent a nipple shadow. No other nodules or masses are noted. No focal infiltrates. No other acute abnormalities. IMPRESSION: A nodular density over the lateral left chest may represent a nipple shadow. Repeat imaging with nipple markers could confirm. No other acute abnormalities. Electronically Signed   By: Dorise Bullion III M.D   On: 04/13/2018 19:47   Ct Head Wo Contrast  Result Date: 04/13/2018 CLINICAL DATA:  Syncope. EXAM: CT HEAD WITHOUT CONTRAST TECHNIQUE: Contiguous axial images were obtained from the base of the skull through the vertex without intravenous contrast. COMPARISON:  CT scan July 01, 2008 FINDINGS: Brain: No subdural, epidural, or subarachnoid hemorrhage. Cerebellum, brainstem, and basal cisterns are normal. Ventricles and sulci are prominent consistent volume loss. No acute cortical ischemia infarct. No  mass effect or midline shift. Vascular: Calcified atherosclerosis in the intracranial carotids. Skull: Normal. Negative for fracture or focal lesion. Sinuses/Orbits: No acute finding. Other: None. IMPRESSION: 1. No acute intracranial abnormalities. Electronically Signed   By: Dorise Bullion III M.D   On: 04/13/2018 19:51     Management plans discussed with the patient, family and they are in agreement.  CODE STATUS:     Code Status Orders  (From admission, onward)         Start     Ordered   04/13/18 2111  Do not attempt resuscitation (DNR)  Continuous    Question Answer Comment  In the event of cardiac or respiratory ARREST Do not call a "code blue"   In the event of cardiac or respiratory ARREST Do not perform Intubation, CPR, defibrillation or ACLS   In the event of cardiac or respiratory ARREST Use medication by any route, position, wound care, and other measures to relive pain and suffering. May use oxygen, suction and manual treatment of airway obstruction as needed for comfort.      04/13/18 2111        Code Status History    Date Active Date Inactive Code Status Order ID Comments User Context   12/12/2017 1549 12/14/2017 1439 Full Code [id number]  Nicholes Mango, MD Inpatient      TOTAL TIME TAKING CARE OF THIS PATIENT: 38 minutes.    Gladstone Lighter M.D on 04/14/2018 at 2:50 PM  Between 7am to 6pm - Pager - [phone]  After 6pm go to [email or web address] - Proofreader  [facility]  Office  [phone]  CC: Primary care physician; [name], MD   Note: This dictation was prepared with Dragon dictation along with smaller phrase technology. Any transcriptional errors that result from this process are unintentional.

## 2018-04-14 NOTE — Care Management Note (Signed)
Case Management Note  Patient Details  Name: Edward Cummings MRN: [id number] Date of Birth: 06/24/1932  Subjective/Objective:    Patient admitted to [facility] under observation status for syncope. RNCM consulted on patient to provide MOON letter and complete assessment. Patient tells me he currently lives with his son and is complete independent with all of his activities at baseline. Patient does not use any DME and still drives occasionally. Patient had a syncopal episode and had passed out, according to the patient he has been at his baseline since he awakened from this episode. Patient noted to be dehydrated on admission and he tells me his normal nutritional status is strong. Recently had his BP meds decreased by PCP ([place]).                    Action/Plan: PT pending. Staff to check orthostatic VS  Expected Discharge Date:                  Expected Discharge Plan:     In-House Referral:     Discharge planning Services     Post Acute Care Choice:    Choice offered to:     DME Arranged:    DME Agency:     HH Arranged:    HH Agency:     Status of Service:     If discussed at [employer], dates discussed:    Additional Comments:  Latanya Maudlin, RN 04/14/2018, 9:36 AM

## 2018-04-14 NOTE — Care Management Obs Status (Signed)
[place] NOTIFICATION   Patient Details  Name: Edward Cummings MRN: [id number] Date of Birth: 1932/07/21   Medicare Observation Status Notification Given:  Yes    Welma Mccombs A Juelz Whittenberg, RN 04/14/2018, 9:34 AM

## 2018-04-14 NOTE — Progress Notes (Signed)
PT Cancellation Note  Patient Details Name: Edward Cummings MRN: [id number] DOB: February 14, 1933   Cancelled Treatment:    Reason Eval/Treat Not Completed: PT screened, no needs identified, will sign off.  Pt up and walking in room when PT arrived.  Able to bend and lift object from floor and reach overhead without LOB.  No pain, sensation loss or asymmetry of strength noted.  No dizziness or symptoms of hypotension reported.  Pt does not present with PT needs at this time.  Please consult if future need arises.   Roxanne Gates, PT, DPT 04/14/2018, 10:59 AM

## 2018-04-14 NOTE — Progress Notes (Signed)
Pt D/C to home with wife. IV removed intact. Education completed. All questions answered.

## 2018-04-15 DIAGNOSIS — C678 Malignant neoplasm of overlapping sites of bladder: Secondary | ICD-10-CM | POA: Diagnosis not present

## 2018-04-15 DIAGNOSIS — C7989 Secondary malignant neoplasm of other specified sites: Secondary | ICD-10-CM | POA: Diagnosis not present

## 2018-04-16 DIAGNOSIS — C679 Malignant neoplasm of bladder, unspecified: Secondary | ICD-10-CM | POA: Diagnosis not present

## 2018-04-16 DIAGNOSIS — R55 Syncope and collapse: Secondary | ICD-10-CM | POA: Diagnosis not present

## 2018-04-16 DIAGNOSIS — R12 Heartburn: Secondary | ICD-10-CM | POA: Diagnosis not present

## 2018-04-16 DIAGNOSIS — Z0000 Encounter for general adult medical examination without abnormal findings: Secondary | ICD-10-CM | POA: Diagnosis not present

## 2018-04-16 DIAGNOSIS — I10 Essential (primary) hypertension: Secondary | ICD-10-CM | POA: Diagnosis not present

## 2018-04-24 ENCOUNTER — Ambulatory Visit
Admission: RE | Admit: 2018-04-24 | Discharge: 2018-04-24 | Disposition: A | Discharge status: Home / Self Care | Payer: Medicare HMO | Source: Ambulatory Visit | Attending: Internal Medicine | Admitting: Internal Medicine

## 2018-04-24 DIAGNOSIS — I700 Atherosclerosis of aorta: Secondary | ICD-10-CM | POA: Diagnosis not present

## 2018-04-24 DIAGNOSIS — C678 Malignant neoplasm of overlapping sites of bladder: Secondary | ICD-10-CM

## 2018-04-24 DIAGNOSIS — C679 Malignant neoplasm of bladder, unspecified: Secondary | ICD-10-CM | POA: Diagnosis not present

## 2018-04-24 IMAGING — CT CT ABD-PELV W/O CM
2 of 4 series · 16 of 46 positions shown, 18 images · non-contrast
Comparison: CT [DATE]

CLINICAL DATA: Follow up for metastatic transitional carcinoma of
the bladder E[facility]RLICH this year and treated with chemo. Pt currently
denies any symptoms. bladder cancer; on immunotherapy

EXAM:
CT ABDOMEN AND PELVIS WITHOUT CONTRAST
TECHNIQUE: Multidetector CT imaging of the abdomen and pelvis was performed
following the standard protocol without IV contrast.

[Series 2: axial st · axial · 0.68mm/px · z∈[+304,+684]mm · 13 of 84 slices shown, 15 images]
[im 4/84  soft-tissue]
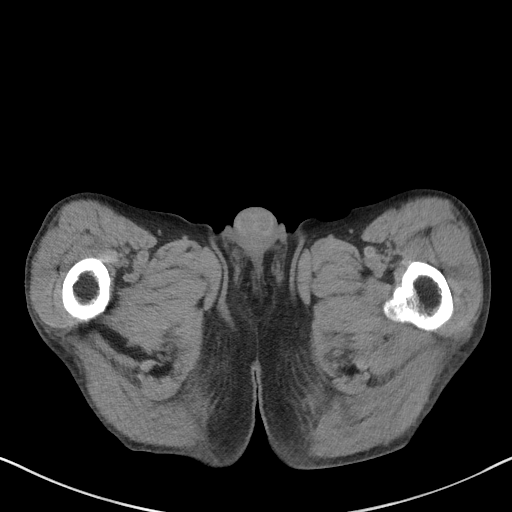
[im 4/84  bone]
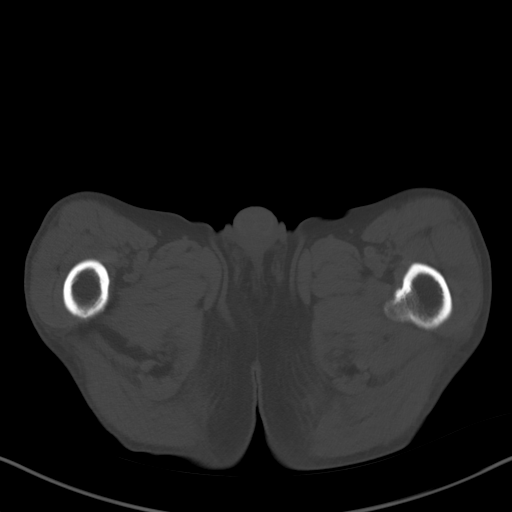
[im 10/84  soft-tissue]
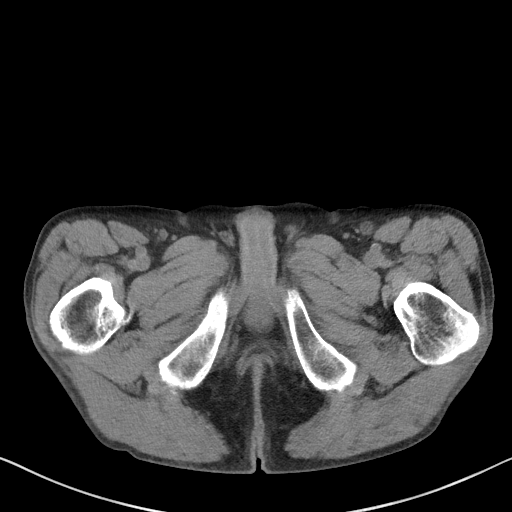
[im 16/84  soft-tissue]
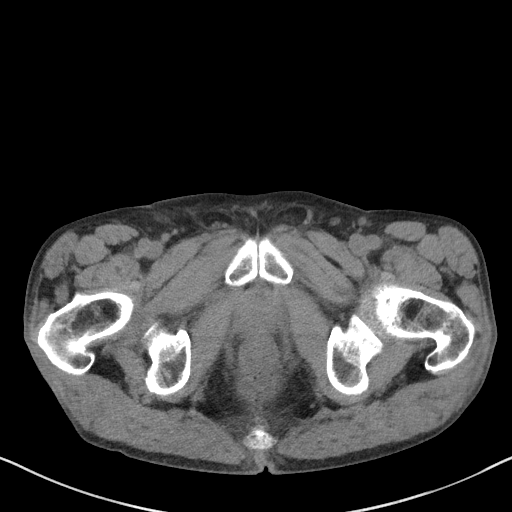
[im 23/84  soft-tissue]
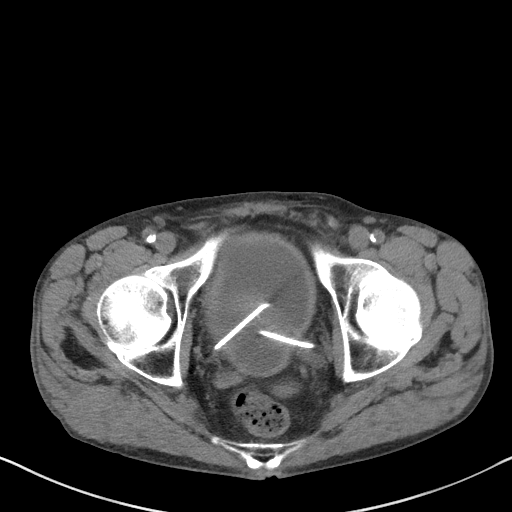
[im 29/84  soft-tissue]
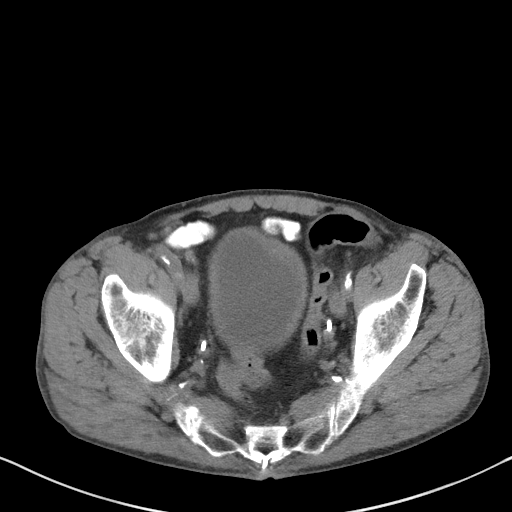
[im 36/84  soft-tissue]
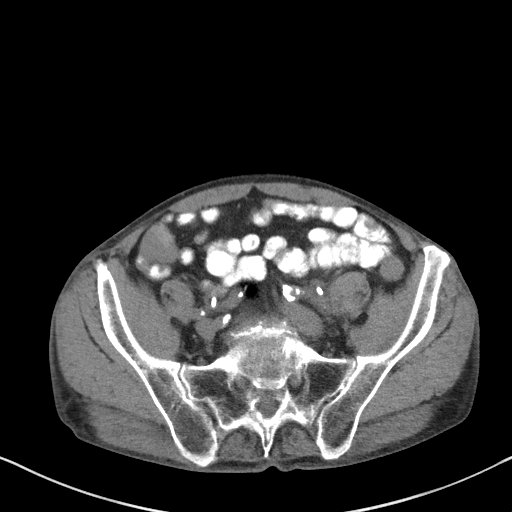
[im 42/84  soft-tissue]
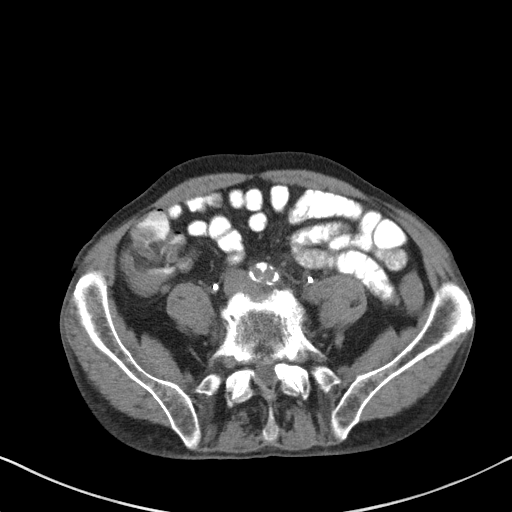
[im 48/84  soft-tissue]
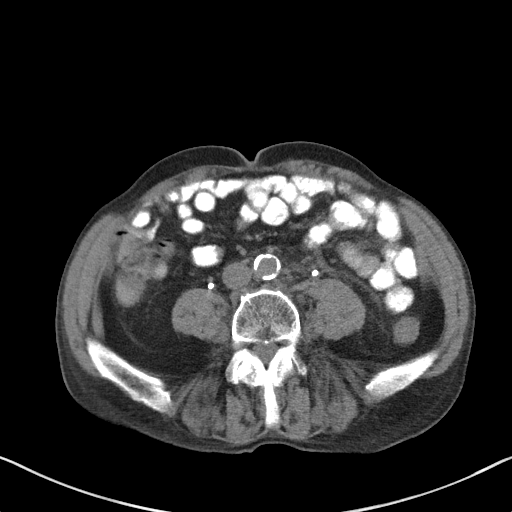
[im 55/84  soft-tissue]
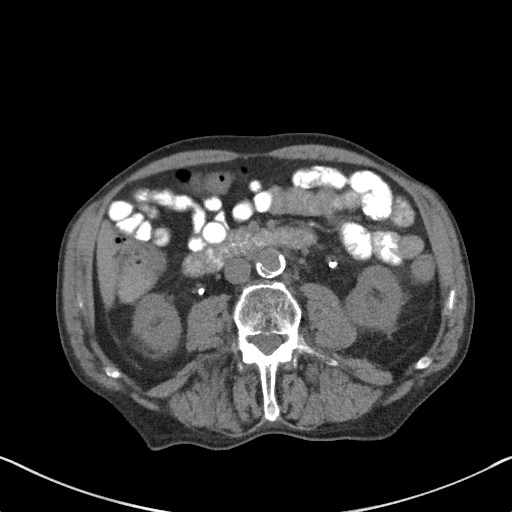
[im 55/84  bone]
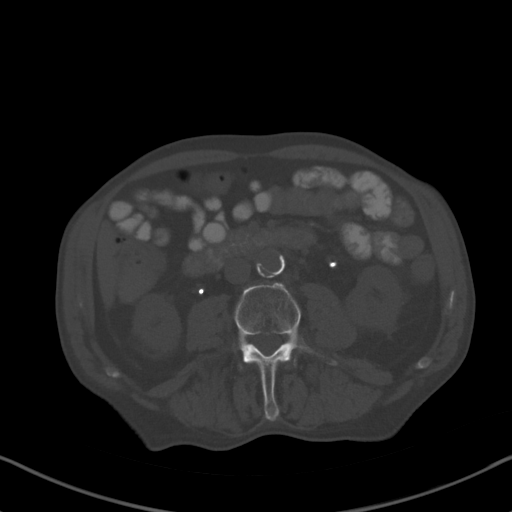
[im 61/84  soft-tissue]
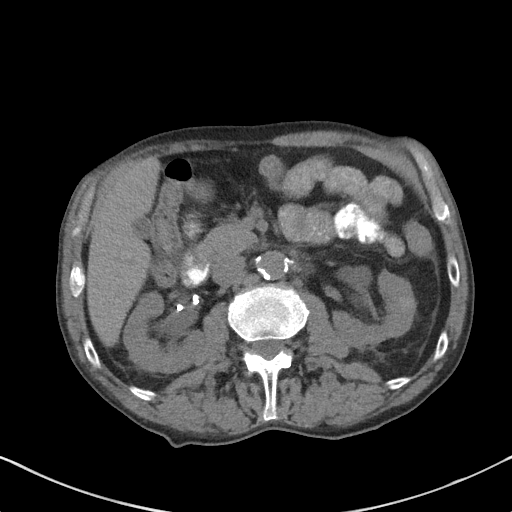
[im 68/84  soft-tissue]
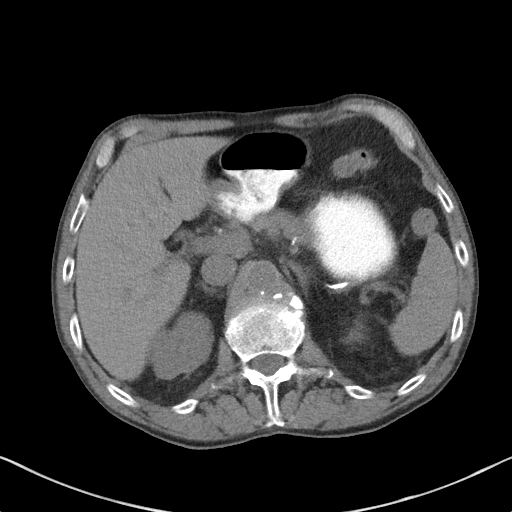
[im 74/84  soft-tissue]
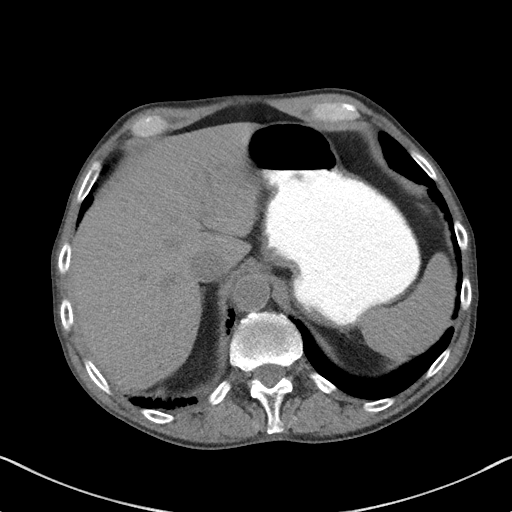
[im 80/84  soft-tissue]
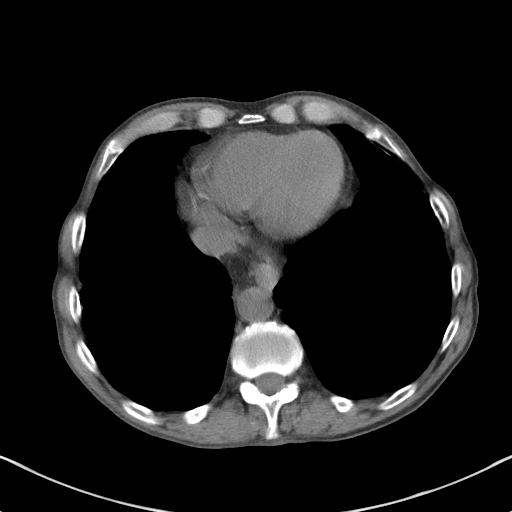

[Series 5: coronal st · coronal · 0.64mm/px · 3 of 80 slices shown]
[im 27/80  soft-tissue]
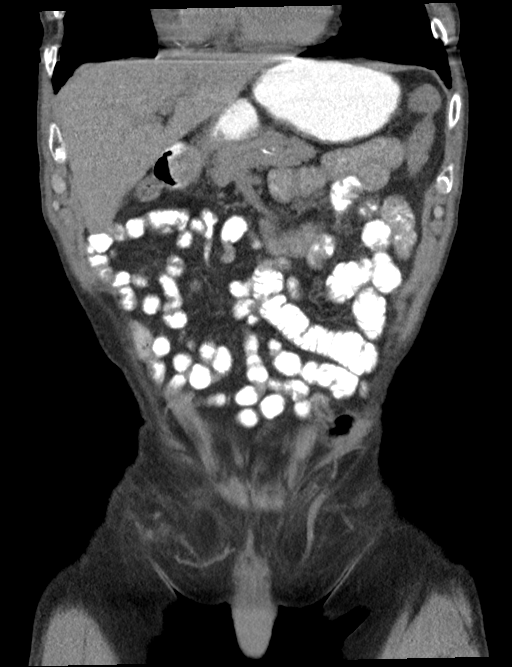
[im 36/80  soft-tissue]
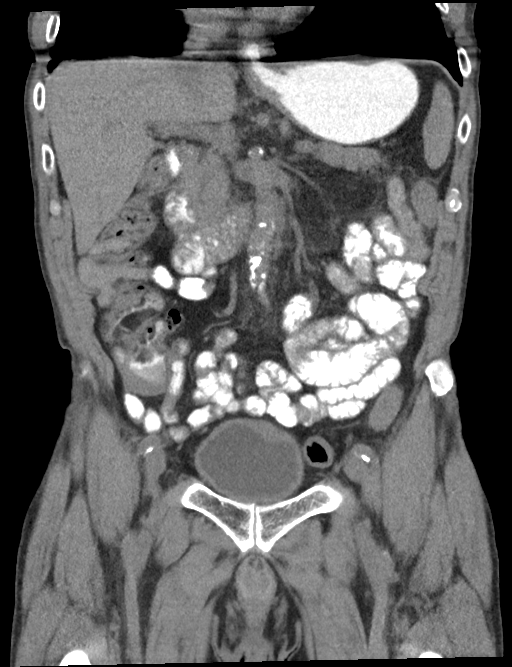
[im 44/80  soft-tissue]
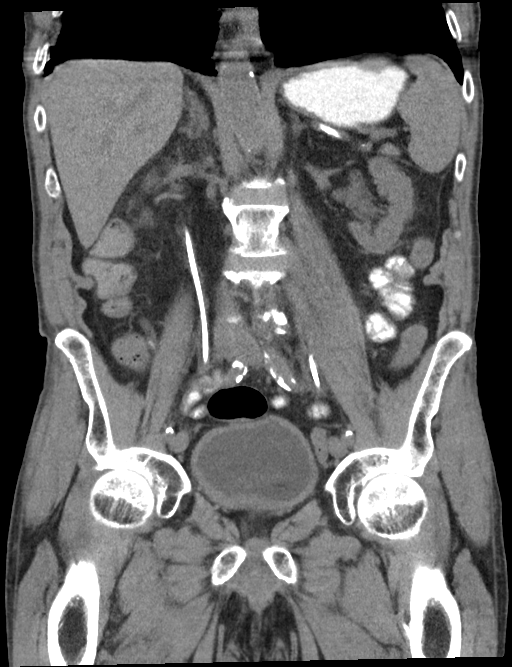

[16 of 46 positions shown; findings below may reference images not displayed]

FINDINGS: Lower chest: Lung bases are clear.

Hepatobiliary: No focal hepatic lesion. No biliary duct dilatation.
Tiny gallstone. No cholecystitis.. Common bile duct is normal.

Pancreas: Pancreas is normal. No ductal dilatation. No pancreatic
inflammation.

Spleen: Normal spleen

Adrenals/urinary tract: Adrenal glands normal. No renal lesion
identified on noncontrast exam. Bilateral ureteral stents in
appropriate position. No gross bladder lesion on noncontrast exam.
Prostate hypertrophy.

Stomach/Bowel: Small hiatal hernia. Stomach, small-bowel appendix
and cecum normal. The colon and rectosigmoid colon normal.

Vascular/Lymphatic: Abdominal aorta is normal caliber with
atherosclerotic calcification. There is no retroperitoneal or
periportal lymphadenopathy. No pelvic lymphadenopathy.

Reproductive: Mild large of the prostate gland.

Other: No free fluid.

Musculoskeletal: No aggressive osseous lesion.
IMPRESSION: 1. No evidence of bladder cancer recurrence on noncontrast exam.
2. Bilateral ureteral stents in appropriate position.
3. No evidence of metastatic disease in the abdomen pelvis.
4. Atherosclerotic calcification of the abdominal aorta.
([M7]-[M7]).

## 2018-04-30 ENCOUNTER — Encounter: Payer: Self-pay | Admitting: Internal Medicine

## 2018-04-30 DIAGNOSIS — R12 Heartburn: Secondary | ICD-10-CM | POA: Diagnosis not present

## 2018-04-30 DIAGNOSIS — C679 Malignant neoplasm of bladder, unspecified: Secondary | ICD-10-CM | POA: Diagnosis not present

## 2018-04-30 DIAGNOSIS — I10 Essential (primary) hypertension: Secondary | ICD-10-CM | POA: Diagnosis not present

## 2018-04-30 DIAGNOSIS — H53002 Unspecified amblyopia, left eye: Secondary | ICD-10-CM | POA: Diagnosis not present

## 2018-05-02 ENCOUNTER — Encounter: Payer: Self-pay | Admitting: Internal Medicine

## 2018-05-02 ENCOUNTER — Inpatient Hospital Stay: Payer: Medicare HMO | Attending: Internal Medicine

## 2018-05-02 ENCOUNTER — Inpatient Hospital Stay (HOSPITAL_BASED_OUTPATIENT_CLINIC_OR_DEPARTMENT_OTHER): Payer: Medicare HMO | Admitting: Internal Medicine

## 2018-05-02 ENCOUNTER — Inpatient Hospital Stay: Payer: Medicare HMO

## 2018-05-02 VITALS — BP 159/63 | HR 76 | Temp 97.4°F | Resp 16 | Wt 128.0 lb

## 2018-05-02 DIAGNOSIS — R42 Dizziness and giddiness: Secondary | ICD-10-CM | POA: Insufficient documentation

## 2018-05-02 DIAGNOSIS — D631 Anemia in chronic kidney disease: Secondary | ICD-10-CM

## 2018-05-02 DIAGNOSIS — N183 Chronic kidney disease, stage 3 unspecified: Secondary | ICD-10-CM

## 2018-05-02 DIAGNOSIS — C772 Secondary and unspecified malignant neoplasm of intra-abdominal lymph nodes: Secondary | ICD-10-CM | POA: Diagnosis not present

## 2018-05-02 DIAGNOSIS — Z87891 Personal history of nicotine dependence: Secondary | ICD-10-CM

## 2018-05-02 DIAGNOSIS — C678 Malignant neoplasm of overlapping sites of bladder: Secondary | ICD-10-CM

## 2018-05-02 DIAGNOSIS — C7951 Secondary malignant neoplasm of bone: Secondary | ICD-10-CM | POA: Insufficient documentation

## 2018-05-02 DIAGNOSIS — Z5112 Encounter for antineoplastic immunotherapy: Secondary | ICD-10-CM | POA: Diagnosis not present

## 2018-05-02 LAB — COMPREHENSIVE METABOLIC PANEL
ALBUMIN: 4 g/dL (ref 3.5–5.0)
ALT: 14 U/L (ref 0–44)
ANION GAP: 4 — AB (ref 5–15)
AST: 20 U/L (ref 15–41)
Alkaline Phosphatase: 67 U/L (ref 38–126)
BUN: 28 mg/dL — ABNORMAL HIGH (ref 8–23)
CALCIUM: 9.4 mg/dL (ref 8.9–10.3)
CO2: 25 mmol/L (ref 22–32)
CREATININE: 2.04 mg/dL — AB (ref 0.61–1.24)
Chloride: 109 mmol/L (ref 98–111)
GFR calc Af Amer: 33 mL/min — ABNORMAL LOW (ref >60)
GFR calc non Af Amer: 28 mL/min — ABNORMAL LOW (ref >60)
Glucose, Bld: 96 mg/dL (ref 70–99)
Potassium: 4.3 mmol/L (ref 3.5–5.1)
SODIUM: 138 mmol/L (ref 135–145)
TOTAL PROTEIN: 6.7 g/dL (ref 6.5–8.1)
Total Bilirubin: 0.8 mg/dL (ref 0.3–1.2)

## 2018-05-02 LAB — CBC WITH DIFFERENTIAL/PLATELET
Abs Immature Granulocytes: 0.01 10*3/uL (ref 0.00–0.07)
BASOS ABS: 0.1 10*3/uL (ref 0.0–0.1)
BASOS PCT: 1 %
EOS ABS: 0.4 10*3/uL (ref 0.0–0.5)
EOS PCT: 9 %
HEMATOCRIT: 29.4 % — AB (ref 39.0–52.0)
Hemoglobin: 9.7 g/dL — ABNORMAL LOW (ref 13.0–17.0)
Immature Granulocytes: 0 %
Lymphocytes Relative: 36 %
Lymphs Abs: 1.8 10*3/uL (ref 0.7–4.0)
MCH: 31.7 pg (ref 26.0–34.0)
MCHC: 33 g/dL (ref 30.0–36.0)
MCV: 96.1 fL (ref 80.0–100.0)
Monocytes Absolute: 0.4 10*3/uL (ref 0.1–1.0)
Monocytes Relative: 7 %
Neutro Abs: 2.3 10*3/uL (ref 1.7–7.7)
Neutrophils Relative %: 47 %
PLATELETS: 231 10*3/uL (ref 150–400)
RBC: 3.06 MIL/uL — AB (ref 4.22–5.81)
RDW: 13 % (ref 11.5–15.5)
WBC: 5 10*3/uL (ref 4.0–10.5)
nRBC: 0 % (ref 0.0–0.2)

## 2018-05-02 MED ORDER — SODIUM CHLORIDE 0.9 % IV SOLN
Freq: Once | INTRAVENOUS | Status: DC
  Filled 2018-05-02: qty 250

## 2018-05-02 MED ORDER — SODIUM CHLORIDE 0.9 % IV SOLN
1200.0000 mg | Freq: Once | INTRAVENOUS | Status: AC
  Administered 2018-05-02: 1200 mg via INTRAVENOUS
  Filled 2018-05-02: qty 20

## 2018-05-02 MED ORDER — SODIUM CHLORIDE 0.9 % IV SOLN
Freq: Once | INTRAVENOUS | Status: AC
  Administered 2018-05-02: 10:00:00 via INTRAVENOUS
  Filled 2018-05-02: qty 250

## 2018-05-02 MED ORDER — IRON SUCROSE 20 MG/ML IV SOLN
200.0000 mg | Freq: Once | INTRAVENOUS | Status: AC
  Administered 2018-05-02: 200 mg via INTRAVENOUS
  Filled 2018-05-02: qty 10

## 2018-05-02 NOTE — Assessment & Plan Note (Addendum)
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  Stage IV;  S/p cycle #3- Currently on Tecentriq; 11th NOV 2019- CT- improved RP LN. improved  # Proceed with Tecentriq #4 today. Labs today reviewed;  acceptable for treatment today.   ## Anemia sec to CKD/ on Iv iron.hb- 9; improving; continue IV iron every 3 weeks.  Stable  # Dizyy-question orthostatic.  Stable recommend Norvasc 5 mg a day.  # Urinary Obstruction- S/p foley explantation; Continue flomax 0.4.  Stable.  #Solitary right pubic rami metastases-Hold x-geva for now.   #Recent syncopal episode noncontrast CT negative for metastases.  Stable  # DISPOSITION: # Tecentriq/venofer today # Follow-up in 3 weeks labs-cbc/cmp- MD Tecentriq IV venofer-Dr.B  # I reviewed the blood work- with the patient in detail; also reviewed the imaging independently [as summarized above]; and with the patient in detail.

## 2018-05-02 NOTE — Progress Notes (Signed)
[facility] CONSULT NOTE  Patient Care Team: [name], MD as PCP - General (Internal Medicine)  CHIEF COMPLAINTS/PURPOSE OF CONSULTATION:  Bladder cancer  #  Oncology History   # AUG 2019-TRANSITIONAL CELL BLADDER CA [~ 4cm tumor] s/p cystoscopy [Dr.Stoiff]  with extensive angiolymphatic invasion; [name]; [name]; mediastinal uptake; right pubic rami uptake.  # 19th ep 2019- Tecentrir  # CKD stage III-IV [creat 2.5]  # Molecular testing- PDL-1 CPS- 20%;   DIAGNOSIS: Bladder ca  STAGE:   IV  ;GOALS: palliative  CURRENT/MOST RECENT THERAPY:Tecentriq        Cancer of overlapping sites of bladder ([place])   02/27/2018 -  Chemotherapy    The patient had atezolizumab (TECENTRIQ) 1,[name], 1,200 mg, Intravenous, Once, 4 of 6 cycles Administration: 1,200 mg (02/28/2018), 1,200 mg (03/21/2018), 1,200 mg (04/11/2018), 1,200 mg (05/02/2018)  for chemotherapy treatment.       HISTORY OF PRESENTING ILLNESS: Edward Cummings 84 y.o.  male with metastatic transitional carcinoma of the bladder currently on Tecentriq is here for follow-up/review results of the restaging CAT scan  In the interim patient was admitted to the hospital for syncopal episode.  Thought to be from dehydration.  Discharge after IV fluids.  Appetite is improving.  No weight loss.  No nausea no vomiting.  No chest pain or shortness with the cough.  No diarrhea.  Chronic mild dizziness.  Review of Systems  Constitutional: Positive for malaise/fatigue. Negative for chills, diaphoresis and fever.  HENT: Negative for nosebleeds and sore throat.   Eyes: Negative for double vision.  Respiratory: Negative for cough, hemoptysis, sputum production, shortness of breath and wheezing.   Cardiovascular: Negative for chest pain, palpitations,  orthopnea and leg swelling.  Gastrointestinal: Positive for constipation. Negative for abdominal pain, blood in stool, diarrhea, heartburn, melena, nausea and vomiting.  Genitourinary: Negative for dysuria, frequency and urgency.  Musculoskeletal: Positive for back pain. Negative for joint pain.  Skin: Negative.  Negative for itching and rash.  Neurological: Positive for dizziness. Negative for tingling, focal weakness, weakness and headaches.  Endo/Heme/Allergies: Does not bruise/bleed easily.  Psychiatric/Behavioral: Negative for depression. The patient is not nervous/anxious and does not have insomnia.      MEDICAL HISTORY:  Past Medical History:  Diagnosis Date  . Anemia   . Cancer ([place])   . Chronic kidney disease   . Depression   . Hypertension   . Neuromuscular disorder ([place])    Nerve damage to left face/eye since around 2002.    SURGICAL HISTORY: Past Surgical History:  Procedure Laterality Date  . CYSTOSCOPY W/ RETROGRADES Bilateral 01/25/2018   Procedure: CYSTOSCOPY WITH RETROGRADE PYELOGRAM;  [name], MD;  [name];  Service: Urology;  Laterality: Bilateral;  . CYSTOSCOPY WITH STENT PLACEMENT Bilateral 01/25/2018   Procedure: CYSTOSCOPY WITH STENT PLACEMENT;  [name], MD;  [name];  Service: Urology;  Laterality: Bilateral;  . EYE SURGERY     Cornea transplants bilaterally & cataract surgery.  . TRANSURETHRAL RESECTION OF BLADDER TUMOR N/A 01/25/2018   Procedure: TRANSURETHRAL RESECTION OF BLADDER TUMOR (TURBT);  [name], MD;  [name];  Service: Urology;  [name];    SOCIAL HISTORY: lives in [place]; with wife; quit smoking 18 years ago; beer every 2 months or so.mechanic/retd.  Social History   Socioeconomic History  . Marital status: Married    Spouse name: Not on file  . Number of children: Not on file  . Years of education: Not on file  . Highest education level:  Not on file  Occupational History  . Not on file  Social Needs  . Financial resource strain: Not hard at all  . Food insecurity:    Worry: Never true    Inability: Never true  . Transportation needs:    Medical: Not on file    Non-medical: Not on file  Tobacco Use  . Smoking status: Former Research scientist (life sciences)  . Smokeless tobacco: Current User    Types: Chew  . Tobacco comment: Stopped approximately 10 years ago.  Substance and Sexual Activity  . Alcohol use: Yes    Alcohol/week: 2.0 standard drinks    Types: 2 Cans of beer per week    Comment: Daily  . Drug use: Never  . Sexual activity: Not on file  Lifestyle  . Physical activity:    Days per week: Not on file    Minutes per session: Not on file  . Stress: Only a little  Relationships  . Social connections:    Talks on phone: Not on file    Gets together: Not on file    Attends religious service: Not on file    Active member of club or organization: Not on file    Attends meetings of clubs or organizations: Not on file    Relationship status: Not on file  . Intimate partner violence:    Fear of current or ex partner: Not on file    Emotionally abused: Not on file    Physically abused: Not on file    Forced sexual activity: Not on file  Other Topics Concern  . Not on file  Social History Narrative  . Not on file    FAMILY HISTORY: Family History  Problem Relation Age of Onset  . Prostate cancer Neg Hx   . Kidney cancer Neg Hx   . Bladder Cancer Neg Hx     ALLERGIES:  has No Known Allergies.  MEDICATIONS:  Current Outpatient Medications  Medication Sig Dispense Refill  . amLODipine (NORVASC) 5 MG tablet Take 1 tablet (5 mg total) by mouth daily. 30 tablet 3  . aspirin EC 81 MG tablet Take 81 mg by mouth daily.     . diphenhydrAMINE (BENADRYL) 25 MG tablet Take 25 mg by mouth daily as needed (ALLERGIES).    Marland Kitchen docusate sodium (COLACE) 100 MG capsule Take 2 capsules (200 mg total) by mouth 2 (two) times daily as needed  for mild constipation. (Patient taking differently: Take 100 mg by mouth at bedtime. ) 10 capsule 0  . HYDROcodone-acetaminophen (NORCO/VICODIN) 5-325 MG tablet Take 1 tablet by mouth every 4 (four) hours as needed for moderate pain. 20 tablet 0  . Multiple Vitamin (MULTIVITAMIN WITH MINERALS) TABS tablet Take 1 tablet by mouth daily. One-A-Day    . oxybutynin (DITROPAN) 5 MG tablet 1 tab tid prn frequency,urgency, bladder spasm (Patient taking differently: Take 5 mg by mouth 3 (three) times daily. ) 15 tablet 0  . prednisoLONE acetate (PRED FORTE) 1 % ophthalmic suspension Place 1 drop into both eyes daily.    . tamsulosin (FLOMAX) 0.4 MG CAPS capsule Take 0.4 mg by mouth at bedtime.      No current facility-administered medications for this visit.       Marland Kitchen  PHYSICAL EXAMINATION:  ECOG PERFORMANCE STATUS: 1 - Symptomatic but completely ambulatory  Vitals:   05/02/18 0859  BP: (!) 159/63  Pulse: 76  Resp: 16  Temp: (!) 97.4 F (36.3 C)   Filed Weights   05/02/18 0859  Weight: 128 lb (58.1 kg)    Physical Exam  Constitutional: He is oriented to person, place, and time.  Accompanied by son and wife.  Walking himself.  Thin built moderately nourished male patient.  Appears pale.  HENT:  Head: Normocephalic and atraumatic.  Mouth/Throat: Oropharynx is clear and moist. No oropharyngeal exudate.  Eyes: Pupils are equal, round, and reactive to light.  Chronic drooping of the left eyelid.  Neck: Normal range of motion. Neck supple.  Cardiovascular: Normal rate and regular rhythm.  Pulmonary/Chest: No respiratory distress. He has no wheezes.  Abdominal: Soft. Bowel sounds are normal. He exhibits no distension and no mass. There is no tenderness. There is no rebound and no guarding.  Musculoskeletal: Normal range of motion. He exhibits no edema or tenderness.  Neurological: He is alert and oriented to person, place, and time.  Skin: Skin is warm.  Psychiatric: Affect normal.      LABORATORY DATA:  I have reviewed the data as listed Lab Results  Component Value Date   WBC 5.0 05/02/2018   HGB 9.7 (L) 05/02/2018   HCT 29.4 (L) 05/02/2018   MCV 96.1 05/02/2018   PLT 231 05/02/2018   Recent Labs    04/11/18 0759 04/13/18 1929 04/14/18 0436 05/02/18 0830  NA 141 140 144 138  K 4.1 3.9 3.8 4.3  CL 111 111 115* 109  CO2 '26 22 25 25  ' GLUCOSE 86 97 81 96  BUN 24* 26* 24* 28*  CREATININE 2.14* 1.91* 1.68* 2.04*  CALCIUM 9.0 8.7* 8.4* 9.4  GFRNONAA 27* 31* 36* 28*  GFRAA 31* 36* 42* 33*  PROT 6.6 6.4*  --  6.7  ALBUMIN 3.7 3.8  --  4.0  AST 19 20  --  20  ALT 13 14  --  14  ALKPHOS 66 66  --  67  BILITOT 0.4 0.6  --  0.8    RADIOGRAPHIC STUDIES: I have personally reviewed the radiological images as listed and agreed with the findings in the report. Ct Abdomen Pelvis Wo Contrast  Result Date: 04/25/2018 CLINICAL DATA:  Follow up for metastatic transitional carcinoma of the bladder dx Sept this year and treated with chemo. Pt currently denies any symptoms. bladder cancer; on immunotherapy EXAM: CT ABDOMEN AND PELVIS WITHOUT CONTRAST TECHNIQUE: Multidetector CT imaging of the abdomen and pelvis was performed following the standard protocol without IV contrast. COMPARISON:  CT 02/26/2018 FINDINGS: Lower chest: Lung bases are clear. Hepatobiliary: No focal hepatic lesion. No biliary duct dilatation. Tiny gallstone. No cholecystitis. Common bile duct is normal. Pancreas: Pancreas is normal. No ductal dilatation. No pancreatic inflammation. Spleen: Normal spleen Adrenals/urinary tract: Adrenal glands normal. No renal lesion identified on noncontrast exam. Bilateral ureteral stents in appropriate position. No gross bladder lesion on noncontrast exam. Prostate hypertrophy. Stomach/Bowel: Small hiatal hernia. Stomach, small-bowel appendix and cecum normal. The colon and rectosigmoid colon normal. Vascular/Lymphatic: Abdominal aorta is normal caliber with  atherosclerotic calcification. There is no retroperitoneal or periportal lymphadenopathy. No pelvic lymphadenopathy. Reproductive: Mild large of the prostate gland. Other: No free fluid. Musculoskeletal: No aggressive osseous lesion. IMPRESSION: 1. No evidence of bladder cancer recurrence on noncontrast exam. 2. Bilateral ureteral stents in appropriate position. 3. No evidence of metastatic disease in the abdomen  pelvis. 4. Atherosclerotic calcification of the abdominal aorta. (ICD10-I70.0). Electronically Signed   By: Suzy Bouchard M.D.   On: 04/25/2018 07:38   Dg Chest 2 View  Result Date: 04/13/2018 CLINICAL DATA:  Syncope.  History of bladder cancer. EXAM: CHEST - 2 VIEW COMPARISON:  July 01, 2008 FINDINGS: The heart, hila, and mediastinum are normal. A nodular density over the lateral left chest may represent a nipple shadow. No other nodules or masses are noted. No focal infiltrates. No other acute abnormalities. IMPRESSION: A nodular density over the lateral left chest may represent a nipple shadow. Repeat imaging with nipple markers could confirm. No other acute abnormalities. Electronically Signed   By: Dorise Bullion III M.D   On: 04/13/2018 19:47   Ct Head Wo Contrast  Result Date: 04/13/2018 CLINICAL DATA:  Syncope. EXAM: CT HEAD WITHOUT CONTRAST TECHNIQUE: Contiguous axial images were obtained from the base of the skull through the vertex without intravenous contrast. COMPARISON:  CT scan July 01, 2008 FINDINGS: Brain: No subdural, epidural, or subarachnoid hemorrhage. Cerebellum, brainstem, and basal cisterns are normal. Ventricles and sulci are prominent consistent volume loss. No acute cortical ischemia infarct. No mass effect or midline shift. Vascular: Calcified atherosclerosis in the intracranial carotids. Skull: Normal. Negative for fracture or focal lesion. Sinuses/Orbits: No acute finding. Other: None. IMPRESSION: 1. No acute intracranial abnormalities. Electronically Signed    By: Dorise Bullion III M.D   On: 04/13/2018 19:51    ASSESSMENT & PLAN:   Cancer of overlapping sites of bladder ([place]) # High-grade transitional cell carcinoma of the bladder metastatic to retroperitoneal lymph node.  Stage IV;  S/p cycle #3- Currently on Tecentriq; 11th NOV 2019- CT- improved RP LN. improved  # Proceed with Tecentriq #4 today. Labs today reviewed;  acceptable for treatment today.   ## Anemia sec to CKD/ on Iv iron.hb- 9; improving; continue IV iron every 3 weeks.  Stable  # Dizyy-question orthostatic.  Stable recommend Norvasc 5 mg a day.  # Urinary Obstruction- S/p foley explantation; Continue flomax 0.4.  Stable.  #Solitary right pubic rami metastases-Hold x-geva for now.   #Recent syncopal episode noncontrast CT negative for metastases.  Stable  # DISPOSITION: # Tecentriq/venofer today # Follow-up in 3 weeks labs-cbc/cmp- MD Tecentriq IV venofer-Dr.B  # I reviewed the blood work- with the patient in detail; also reviewed the imaging independently [as summarized above]; and with the patient in detail.    All questions were answered. The patient knows to call the clinic with any problems, questions or concerns.    Cammie Sickle, MD 05/03/2018 7:27 AM

## 2018-05-23 ENCOUNTER — Inpatient Hospital Stay (HOSPITAL_BASED_OUTPATIENT_CLINIC_OR_DEPARTMENT_OTHER): Payer: Medicare HMO | Admitting: Internal Medicine

## 2018-05-23 ENCOUNTER — Other Ambulatory Visit: Payer: Self-pay

## 2018-05-23 ENCOUNTER — Inpatient Hospital Stay: Payer: Medicare HMO

## 2018-05-23 ENCOUNTER — Inpatient Hospital Stay: Payer: Medicare HMO | Attending: Internal Medicine

## 2018-05-23 VITALS — BP 143/66 | HR 77 | Temp 96.1°F | Resp 16 | Wt 127.6 lb

## 2018-05-23 DIAGNOSIS — R682 Dry mouth, unspecified: Secondary | ICD-10-CM | POA: Diagnosis not present

## 2018-05-23 DIAGNOSIS — D631 Anemia in chronic kidney disease: Secondary | ICD-10-CM

## 2018-05-23 DIAGNOSIS — C772 Secondary and unspecified malignant neoplasm of intra-abdominal lymph nodes: Secondary | ICD-10-CM | POA: Diagnosis not present

## 2018-05-23 DIAGNOSIS — R42 Dizziness and giddiness: Secondary | ICD-10-CM

## 2018-05-23 DIAGNOSIS — Z5112 Encounter for antineoplastic immunotherapy: Secondary | ICD-10-CM | POA: Insufficient documentation

## 2018-05-23 DIAGNOSIS — N183 Chronic kidney disease, stage 3 unspecified: Secondary | ICD-10-CM

## 2018-05-23 DIAGNOSIS — C678 Malignant neoplasm of overlapping sites of bladder: Secondary | ICD-10-CM | POA: Diagnosis not present

## 2018-05-23 DIAGNOSIS — K6289 Other specified diseases of anus and rectum: Secondary | ICD-10-CM | POA: Diagnosis not present

## 2018-05-23 DIAGNOSIS — Z87891 Personal history of nicotine dependence: Secondary | ICD-10-CM

## 2018-05-23 LAB — CBC WITH DIFFERENTIAL/PLATELET
ABS IMMATURE GRANULOCYTES: 0.01 10*3/uL (ref 0.00–0.07)
BASOS ABS: 0 10*3/uL (ref 0.0–0.1)
BASOS PCT: 0 %
EOS ABS: 0.4 10*3/uL (ref 0.0–0.5)
Eosinophils Relative: 7 %
HCT: 28 % — ABNORMAL LOW (ref 39.0–52.0)
Hemoglobin: 9.1 g/dL — ABNORMAL LOW (ref 13.0–17.0)
Immature Granulocytes: 0 %
Lymphocytes Relative: 31 %
Lymphs Abs: 1.6 10*3/uL (ref 0.7–4.0)
MCH: 31.1 pg (ref 26.0–34.0)
MCHC: 32.5 g/dL (ref 30.0–36.0)
MCV: 95.6 fL (ref 80.0–100.0)
MONO ABS: 0.5 10*3/uL (ref 0.1–1.0)
Monocytes Relative: 9 %
NEUTROS PCT: 53 %
Neutro Abs: 2.6 10*3/uL (ref 1.7–7.7)
Platelets: 190 10*3/uL (ref 150–400)
RBC: 2.93 MIL/uL — ABNORMAL LOW (ref 4.22–5.81)
RDW: 12.9 % (ref 11.5–15.5)
WBC: 5 10*3/uL (ref 4.0–10.5)
nRBC: 0 % (ref 0.0–0.2)

## 2018-05-23 LAB — COMPREHENSIVE METABOLIC PANEL
ALT: 19 U/L (ref 0–44)
AST: 22 U/L (ref 15–41)
Albumin: 3.8 g/dL (ref 3.5–5.0)
Alkaline Phosphatase: 66 U/L (ref 38–126)
Anion gap: 4 — ABNORMAL LOW (ref 5–15)
BUN: 36 mg/dL — ABNORMAL HIGH (ref 8–23)
CO2: 25 mmol/L (ref 22–32)
CREATININE: 2.25 mg/dL — AB (ref 0.61–1.24)
Calcium: 9.2 mg/dL (ref 8.9–10.3)
Chloride: 113 mmol/L — ABNORMAL HIGH (ref 98–111)
GFR calc Af Amer: 30 mL/min — ABNORMAL LOW (ref >60)
GFR, EST NON AFRICAN AMERICAN: 26 mL/min — AB (ref >60)
Glucose, Bld: 111 mg/dL — ABNORMAL HIGH (ref 70–99)
POTASSIUM: 4.3 mmol/L (ref 3.5–5.1)
SODIUM: 142 mmol/L (ref 135–145)
Total Bilirubin: 0.7 mg/dL (ref 0.3–1.2)
Total Protein: 6.3 g/dL — ABNORMAL LOW (ref 6.5–8.1)

## 2018-05-23 MED ORDER — HYDROCODONE-ACETAMINOPHEN 5-325 MG PO TABS: 1.0000 | ORAL_TABLET | Freq: Three times a day (TID) | ORAL | 0 refills | Status: DC | PRN

## 2018-05-23 MED ORDER — HEPARIN SOD (PORK) LOCK FLUSH 100 UNIT/ML IV SOLN: 500.0000 [IU] | Freq: Once | INTRAVENOUS | Status: DC | PRN

## 2018-05-23 MED ORDER — IRON SUCROSE 20 MG/ML IV SOLN
200.0000 mg | Freq: Once | INTRAVENOUS | Status: AC
  Administered 2018-05-23: 200 mg via INTRAVENOUS
  Filled 2018-05-23: qty 10

## 2018-05-23 MED ORDER — SODIUM CHLORIDE 0.9 % IV SOLN
Freq: Once | INTRAVENOUS | Status: AC
  Administered 2018-05-23: 10:00:00 via INTRAVENOUS
  Filled 2018-05-23: qty 250

## 2018-05-23 MED ORDER — SODIUM CHLORIDE 0.9 % IV SOLN
1200.0000 mg | Freq: Once | INTRAVENOUS | Status: AC
  Administered 2018-05-23: 1200 mg via INTRAVENOUS
  Filled 2018-05-23: qty 20

## 2018-05-23 NOTE — Progress Notes (Signed)
[facility] CONSULT NOTE  Patient Care Team: [name], MD as PCP - General (Internal Medicine)  CHIEF COMPLAINTS/PURPOSE OF CONSULTATION:  Bladder cancer  #  Oncology History   # AUG 2019-TRANSITIONAL CELL BLADDER CA [~ 4cm tumor] s/p cystoscopy [Dr.Stoiff]  with extensive angiolymphatic invasion; [name]; [name]; mediastinal uptake; right pubic rami uptake.  # 19th ep 2019- Tecentrir  # CKD stage III-IV [creat 2.5]  # Molecular testing- PDL-1 CPS- 20%; NO other targets**  DIAGNOSIS: Bladder ca  STAGE:   IV  ;GOALS: palliative  CURRENT/MOST RECENT THERAPY:Tecentriq        Cancer of overlapping sites of bladder ([place])   02/28/2018 -  Chemotherapy    The patient had atezolizumab (TECENTRIQ) 1,[name], 1,200 mg, Intravenous, Once, 5 of 8 cycles Administration: 1,200 mg (02/28/2018), 1,200 mg (03/21/2018), 1,200 mg (04/11/2018), 1,200 mg (05/02/2018)  for chemotherapy treatment.       HISTORY OF PRESENTING ILLNESS: Edward Cummings 84 y.o.  male with metastatic transitional carcinoma of the bladder currently on Tecentriq is here for follow-up.  Patient has not had any recent hospital visits.  No more syncopal episodes.  He continues to feel little dizzy on standing up.  Otherwise no falls.  Denies any chest pain or shortness of breath.  Has intermittent pain in his rectal region/penile area as per his wife.  He has been taking hydrocodone as needed.  Not any worse.  Complains of dry mouth.  Review of Systems  Constitutional: Positive for malaise/fatigue. Negative for chills, diaphoresis and fever.  HENT: Negative for nosebleeds and sore throat.        Dry mouth  Eyes: Negative for double vision.  Respiratory: Negative for cough, hemoptysis, sputum production, shortness of breath and  wheezing.   Cardiovascular: Negative for chest pain, palpitations, orthopnea and leg swelling.  Gastrointestinal: Negative for abdominal pain, blood in stool, diarrhea, heartburn, melena, nausea and vomiting.  Genitourinary: Negative for dysuria, frequency and urgency.  Musculoskeletal: Positive for back pain. Negative for joint pain.  Skin: Negative.  Negative for itching and rash.  Neurological: Positive for dizziness. Negative for tingling, focal weakness, weakness and headaches.  Endo/Heme/Allergies: Does not bruise/bleed easily.  Psychiatric/Behavioral: Negative for depression. The patient is not nervous/anxious and does not have insomnia.      MEDICAL HISTORY:  Past Medical History:  Diagnosis Date  . Anemia   . Cancer ([place])   . Chronic kidney disease   . Depression   . Hypertension   . Neuromuscular disorder ([place])    Nerve damage to left face/eye since around 2002.    SURGICAL HISTORY: Past Surgical History:  Procedure Laterality Date  . CYSTOSCOPY W/ RETROGRADES Bilateral 01/25/2018   Procedure: CYSTOSCOPY WITH RETROGRADE PYELOGRAM;  [name], MD;  [name];  Service: Urology;  Laterality: Bilateral;  . CYSTOSCOPY WITH STENT PLACEMENT Bilateral 01/25/2018   Procedure: CYSTOSCOPY WITH STENT PLACEMENT;  [name], MD;  [name];  Service: Urology;  Laterality: Bilateral;  . EYE SURGERY     Cornea transplants bilaterally & cataract surgery.  . TRANSURETHRAL RESECTION OF BLADDER TUMOR N/A 01/25/2018   Procedure: TRANSURETHRAL RESECTION OF BLADDER TUMOR (TURBT);  [name], MD;  [name];  Service: Urology;  [name];    SOCIAL HISTORY: lives in [place];  with wife; quit smoking 18 years ago; beer every 2 months or so.mechanic/retd.   Social History   Socioeconomic History  . Marital status: Married    Spouse name: Not on file  . Number of children: Not on file  . Years of education:  Not on file  . Highest education level: Not on file  Occupational History  . Not on file  Social Needs  . Financial resource strain: Not hard at all  . Food insecurity:    Worry: Never true    Inability: Never true  . Transportation needs:    Medical: Not on file    Non-medical: Not on file  Tobacco Use  . Smoking status: Former Research scientist (life sciences)  . Smokeless tobacco: Current User    Types: Chew  . Tobacco comment: Stopped approximately 10 years ago.  Substance and Sexual Activity  . Alcohol use: Yes    Alcohol/week: 2.0 standard drinks    Types: 2 Cans of beer per week    Comment: Daily  . Drug use: Never  . Sexual activity: Not on file  Lifestyle  . Physical activity:    Days per week: Not on file    Minutes per session: Not on file  . Stress: Only a little  Relationships  . Social connections:    Talks on phone: Not on file    Gets together: Not on file    Attends religious service: Not on file    Active member of club or organization: Not on file    Attends meetings of clubs or organizations: Not on file    Relationship status: Not on file  . Intimate partner violence:    Fear of current or ex partner: Not on file    Emotionally abused: Not on file    Physically abused: Not on file    Forced sexual activity: Not on file  Other Topics Concern  . Not on file  Social History Narrative  . Not on file    FAMILY HISTORY: Family History  Problem Relation Age of Onset  . Prostate cancer Neg Hx   . Kidney cancer Neg Hx   . Bladder Cancer Neg Hx     ALLERGIES:  has No Known Allergies.  MEDICATIONS:  Current Outpatient Medications  Medication Sig Dispense Refill  . amLODipine (NORVASC) 5 MG tablet Take 1 tablet (5 mg total) by mouth daily. 30 tablet 3  . aspirin EC 81 MG tablet Take 81 mg by mouth daily.     . diphenhydrAMINE (BENADRYL) 25 MG tablet Take 25 mg by mouth daily as needed (ALLERGIES).    Marland Kitchen docusate sodium (COLACE) 100 MG capsule Take 2 capsules (200 mg  total) by mouth 2 (two) times daily as needed for mild constipation. (Patient taking differently: Take 100 mg by mouth at bedtime. ) 10 capsule 0  . HYDROcodone-acetaminophen (NORCO/VICODIN) 5-325 MG tablet Take 1 tablet by mouth every 8 (eight) hours as needed for moderate pain. 30 tablet 0  . Multiple Vitamin (MULTIVITAMIN WITH MINERALS) TABS tablet Take 1 tablet by mouth daily. One-A-Day    . oxybutynin (DITROPAN) 5 MG tablet 1 tab tid prn frequency,urgency, bladder spasm (Patient taking differently: Take 5 mg by mouth 3 (three) times daily. ) 15 tablet 0  . prednisoLONE acetate (PRED FORTE) 1 % ophthalmic suspension Place 1 drop into both eyes daily.    . tamsulosin (FLOMAX) 0.4 MG CAPS capsule Take 0.4 mg by mouth at bedtime.      No current  facility-administered medications for this visit.    Facility-Administered Medications Ordered in Other Visits  Medication Dose Route Frequency Provider Last Rate Last Dose  . atezolizumab (TECENTRIQ) 1,[name]  1,200 mg Intravenous Once Charlaine Dalton R, MD      . heparin lock flush 100 unit/mL  500 Units Intracatheter Once PRN Cammie Sickle, MD          .  PHYSICAL EXAMINATION: ECOG PERFORMANCE STATUS: 1 - Symptomatic but completely ambulatory  Vitals:   05/23/18 0846  BP: (!) 143/66  Pulse: 77  Resp: 16  Temp: (!) 96.1 F (35.6 C)   Filed Weights   05/23/18 0846  Weight: 127 lb 9.6 oz (57.9 kg)    Physical Exam  Constitutional: He is oriented to person, place, and time.  Accompanied by son and wife.  Walking himself.  Thin built moderately nourished male patient.  Appears pale.  HENT:  Head: Normocephalic and atraumatic.  Mouth/Throat: Oropharynx is clear and moist. No oropharyngeal exudate.  Eyes: Pupils are equal, round, and reactive to light.  Chronic drooping of the left eyelid.  Neck: Normal range of motion. Neck supple.  Cardiovascular: Normal rate and regular  rhythm.  Pulmonary/Chest: No respiratory distress. He has no wheezes.  Abdominal: Soft. Bowel sounds are normal. He exhibits no distension and no mass. There is no abdominal tenderness. There is no rebound and no guarding.  Musculoskeletal: Normal range of motion.        General: No tenderness or edema.  Neurological: He is alert and oriented to person, place, and time.  Skin: Skin is warm.  Psychiatric: Affect normal.     LABORATORY DATA:  I have reviewed the data as listed Lab Results  Component Value Date   WBC 5.0 05/23/2018   HGB 9.1 (L) 05/23/2018   HCT 28.0 (L) 05/23/2018   MCV 95.6 05/23/2018   PLT 190 05/23/2018   Recent Labs    04/13/18 1929 04/14/18 0436 05/02/18 0830 05/23/18 0819  NA 140 144 138 142  K 3.9 3.8 4.3 4.3  CL 111 115* 109 113*  CO2 _0 GLUCOSE 97 81 96 111*  BUN 26* 24* 28* 36*  CREATININE 1.91* 1.68* 2.04* 2.25*  CALCIUM 8.7* 8.4* 9.4 9.2  GFRNONAA 31* 36* 28* 26*  GFRAA 36* 42* 33* 30*  PROT 6.4*  --  6.7 6.3*  ALBUMIN 3.8  --  4.0 3.8  AST 20  --  20 22  ALT 14  --  14 19  ALKPHOS 66  --  67 66  BILITOT 0.6  --  0.8 0.7    RADIOGRAPHIC STUDIES: I have personally reviewed the radiological images as listed and agreed with the findings in the report. Ct Abdomen Pelvis Wo Contrast  Result Date: 04/25/2018 CLINICAL DATA:  Follow up for metastatic transitional carcinoma of the bladder dx Sept this year and treated with chemo. Pt currently denies any symptoms. bladder cancer; on immunotherapy EXAM: CT ABDOMEN AND PELVIS WITHOUT CONTRAST TECHNIQUE: Multidetector CT imaging of the abdomen and pelvis was performed following the standard protocol without IV contrast. COMPARISON:  CT 02/26/2018 FINDINGS: Lower chest: Lung bases are clear. Hepatobiliary: No focal hepatic lesion. No biliary duct dilatation. Tiny gallstone. No cholecystitis. Common bile duct is normal. Pancreas: Pancreas is normal. No ductal dilatation. No pancreatic  inflammation. Spleen: Normal spleen Adrenals/urinary tract: Adrenal glands normal. No renal lesion identified on noncontrast exam. Bilateral ureteral  stents in appropriate position. No gross bladder lesion on noncontrast exam. Prostate hypertrophy. Stomach/Bowel: Small hiatal hernia. Stomach, small-bowel appendix and cecum normal. The colon and rectosigmoid colon normal. Vascular/Lymphatic: Abdominal aorta is normal caliber with atherosclerotic calcification. There is no retroperitoneal or periportal lymphadenopathy. No pelvic lymphadenopathy. Reproductive: Mild large of the prostate gland. Other: No free fluid. Musculoskeletal: No aggressive osseous lesion. IMPRESSION: 1. No evidence of bladder cancer recurrence on noncontrast exam. 2. Bilateral ureteral stents in appropriate position. 3. No evidence of metastatic disease in the abdomen pelvis. 4. Atherosclerotic calcification of the abdominal aorta. (ICD10-I70.0). Electronically Signed   By: Suzy Bouchard M.D.   On: 04/25/2018 07:38    ASSESSMENT & PLAN:   Cancer of overlapping sites of bladder ([place]) # High-grade transitional cell carcinoma of the bladder metastatic to retroperitoneal lymph node.  Stage IV;  S/p cycle #3- Currently on Tecentriq; 11th NOV 2019- CT- improved RP LN. stable  # Proceed with Tecentriq #5 today. Labs today reviewed;  acceptable for treatment today.   # Anemia sec to CKD/ on Iv iron.hb- 9;STABLE; continue IV iron every 3 weeks.  # Urinary Obstruction- improved on Continue flomax 0.4.  Stable.  # Dry mouth- ? Ditropan; recommend taking once a day.   # DISPOSITION: # Tecentriq/venofer today # Follow-up in 3 weeks labs-cbc/cmp- MD Tecentriq IV venofer-Dr.B   All questions were answered. The patient knows to call the clinic with any problems, questions or concerns.    Cammie Sickle, MD 05/23/2018 9:55 AM

## 2018-05-23 NOTE — Assessment & Plan Note (Addendum)
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  Stage IV;  S/p cycle #3- Currently on Tecentriq; 11th NOV 2019- CT- improved RP LN. stable  # Proceed with Tecentriq #5 today. Labs today reviewed;  acceptable for treatment today.   # Anemia sec to CKD/ on Iv iron.hb- 9;STABLE; continue IV iron every 3 weeks.  # Urinary Obstruction- improved on Continue flomax 0.4.  Stable.  # Dry mouth- ? Ditropan; recommend taking once a day.   # DISPOSITION: # Tecentriq/venofer today # Follow-up in 3 weeks labs-cbc/cmp- MD Tecentriq IV venofer-Dr.B

## 2018-06-11 ENCOUNTER — Other Ambulatory Visit: Payer: Self-pay | Admitting: *Deleted

## 2018-06-11 DIAGNOSIS — C678 Malignant neoplasm of overlapping sites of bladder: Secondary | ICD-10-CM

## 2018-06-13 ENCOUNTER — Other Ambulatory Visit: Payer: Self-pay

## 2018-06-13 ENCOUNTER — Inpatient Hospital Stay (HOSPITAL_BASED_OUTPATIENT_CLINIC_OR_DEPARTMENT_OTHER): Payer: Medicare HMO | Admitting: Internal Medicine

## 2018-06-13 ENCOUNTER — Encounter: Payer: Self-pay | Admitting: Internal Medicine

## 2018-06-13 ENCOUNTER — Inpatient Hospital Stay: Payer: Medicare HMO | Attending: Internal Medicine

## 2018-06-13 ENCOUNTER — Inpatient Hospital Stay: Payer: Medicare HMO

## 2018-06-13 VITALS — BP 161/65 | HR 70 | Temp 97.5°F | Resp 18 | Ht 68.0 in | Wt 129.0 lb

## 2018-06-13 DIAGNOSIS — D631 Anemia in chronic kidney disease: Secondary | ICD-10-CM | POA: Insufficient documentation

## 2018-06-13 DIAGNOSIS — R5383 Other fatigue: Secondary | ICD-10-CM

## 2018-06-13 DIAGNOSIS — Z87891 Personal history of nicotine dependence: Secondary | ICD-10-CM

## 2018-06-13 DIAGNOSIS — M545 Low back pain: Secondary | ICD-10-CM

## 2018-06-13 DIAGNOSIS — N183 Chronic kidney disease, stage 3 unspecified: Secondary | ICD-10-CM

## 2018-06-13 DIAGNOSIS — Z79899 Other long term (current) drug therapy: Secondary | ICD-10-CM | POA: Insufficient documentation

## 2018-06-13 DIAGNOSIS — Z5112 Encounter for antineoplastic immunotherapy: Secondary | ICD-10-CM | POA: Insufficient documentation

## 2018-06-13 DIAGNOSIS — I129 Hypertensive chronic kidney disease with stage 1 through stage 4 chronic kidney disease, or unspecified chronic kidney disease: Secondary | ICD-10-CM

## 2018-06-13 DIAGNOSIS — C678 Malignant neoplasm of overlapping sites of bladder: Secondary | ICD-10-CM | POA: Insufficient documentation

## 2018-06-13 DIAGNOSIS — G8929 Other chronic pain: Secondary | ICD-10-CM | POA: Diagnosis not present

## 2018-06-13 DIAGNOSIS — I951 Orthostatic hypotension: Secondary | ICD-10-CM

## 2018-06-13 LAB — CBC WITH DIFFERENTIAL/PLATELET
Abs Immature Granulocytes: 0 10*3/uL (ref 0.00–0.07)
Basophils Absolute: 0 10*3/uL (ref 0.0–0.1)
Basophils Relative: 1 %
Eosinophils Absolute: 0.5 10*3/uL (ref 0.0–0.5)
Eosinophils Relative: 8 %
HCT: 29.8 % — ABNORMAL LOW (ref 39.0–52.0)
Hemoglobin: 9.7 g/dL — ABNORMAL LOW (ref 13.0–17.0)
Immature Granulocytes: 0 %
Lymphocytes Relative: 33 %
Lymphs Abs: 1.8 10*3/uL (ref 0.7–4.0)
MCH: 30.6 pg (ref 26.0–34.0)
MCHC: 32.6 g/dL (ref 30.0–36.0)
MCV: 94 fL (ref 80.0–100.0)
Monocytes Absolute: 0.5 10*3/uL (ref 0.1–1.0)
Monocytes Relative: 9 %
NEUTROS PCT: 49 %
Neutro Abs: 2.7 10*3/uL (ref 1.7–7.7)
PLATELETS: 206 10*3/uL (ref 150–400)
RBC: 3.17 MIL/uL — AB (ref 4.22–5.81)
RDW: 12.4 % (ref 11.5–15.5)
WBC: 5.4 10*3/uL (ref 4.0–10.5)
nRBC: 0 % (ref 0.0–0.2)

## 2018-06-13 LAB — COMPREHENSIVE METABOLIC PANEL
ALT: 25 U/L (ref 0–44)
ANION GAP: 6 (ref 5–15)
AST: 24 U/L (ref 15–41)
Albumin: 3.7 g/dL (ref 3.5–5.0)
Alkaline Phosphatase: 73 U/L (ref 38–126)
BUN: 25 mg/dL — ABNORMAL HIGH (ref 8–23)
CHLORIDE: 110 mmol/L (ref 98–111)
CO2: 25 mmol/L (ref 22–32)
Calcium: 9.4 mg/dL (ref 8.9–10.3)
Creatinine, Ser: 1.72 mg/dL — ABNORMAL HIGH (ref 0.61–1.24)
GFR calc Af Amer: 42 mL/min — ABNORMAL LOW (ref >60)
GFR calc non Af Amer: 36 mL/min — ABNORMAL LOW (ref >60)
Glucose, Bld: 119 mg/dL — ABNORMAL HIGH (ref 70–99)
Potassium: 3.7 mmol/L (ref 3.5–5.1)
Sodium: 141 mmol/L (ref 135–145)
Total Bilirubin: 0.6 mg/dL (ref 0.3–1.2)
Total Protein: 6.7 g/dL (ref 6.5–8.1)

## 2018-06-13 LAB — TSH: TSH: <0.01 u[IU]/mL — ABNORMAL LOW (ref 0.350–4.500)

## 2018-06-13 MED ORDER — IRON SUCROSE 20 MG/ML IV SOLN
200.0000 mg | Freq: Once | INTRAVENOUS | Status: AC
  Administered 2018-06-13: 200 mg via INTRAVENOUS
  Filled 2018-06-13: qty 10

## 2018-06-13 MED ORDER — SODIUM CHLORIDE 0.9 % IV SOLN
Freq: Once | INTRAVENOUS | Status: AC
  Administered 2018-06-13: 10:00:00 via INTRAVENOUS
  Filled 2018-06-13: qty 250

## 2018-06-13 MED ORDER — SODIUM CHLORIDE 0.9 % IV SOLN
1200.0000 mg | Freq: Once | INTRAVENOUS | Status: AC
  Administered 2018-06-13: 1200 mg via INTRAVENOUS
  Filled 2018-06-13: qty 20

## 2018-06-13 MED ORDER — SODIUM CHLORIDE 0.9 % IV SOLN
Freq: Once | INTRAVENOUS | Status: AC
  Filled 2018-06-13: qty 250

## 2018-06-13 NOTE — Progress Notes (Signed)
[facility] CONSULT NOTE  Patient Care Team: Masoud, Javed, MD as PCP - General (Internal Medicine)  CHIEF COMPLAINTS/PURPOSE OF CONSULTATION:  Bladder cancer  #  Oncology History   # AUG 2019-TRANSITIONAL CELL BLADDER CA [~ 4cm tumor] s/p cystoscopy [Dr.Stoiff]  with extensive angiolymphatic invasion; [name]; [name]; mediastinal uptake; right pubic rami uptake.  # 19th ep 2019- Tecentrir  # CKD stage III-IV [creat 2.5]  # Molecular testing- PDL-1 CPS- 20%; NO other targets**  DIAGNOSIS: Bladder ca  STAGE:   IV  ;GOALS: palliative  CURRENT/MOST RECENT THERAPY:Tecentriq        Cancer of overlapping sites of bladder ([facility])   02/28/2018 -  Chemotherapy    The patient had atezolizumab (TECENTRIQ) 1,[name], 1,200 mg, Intravenous, Once, 5 of 8 cycles Administration: 1,200 mg (02/28/2018), 1,200 mg (03/21/2018), 1,200 mg (04/11/2018), 1,200 mg (05/02/2018), 1,200 mg (05/23/2018)  for chemotherapy treatment.       HISTORY OF PRESENTING ILLNESS: Edward Cummings 85 y.o.  male with metastatic transitional carcinoma of the bladder currently on Tecentriq is here for follow-up.  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   Currently symptoms have overall improved.  Continues to have mild to moderate fatigue on exertion.  His appetite is improving.  Chronic mild back pain.   Review of Systems  Constitutional: Positive for malaise/fatigue. Negative for chills, diaphoresis and fever.  HENT: Negative for nosebleeds and sore throat.   Eyes: Negative for double vision.  Respiratory: Negative for cough, hemoptysis, sputum production, shortness of breath and wheezing.   Cardiovascular: Negative for chest pain, palpitations,  orthopnea and leg swelling.  Gastrointestinal: Negative for abdominal pain, blood in stool, diarrhea, heartburn, melena, nausea and vomiting.  Genitourinary: Negative for dysuria, frequency and urgency.  Musculoskeletal: Positive for back pain. Negative for joint pain.  Skin: Negative.  Negative for itching and rash.  Neurological: Positive for dizziness. Negative for tingling, focal weakness, weakness and headaches.  Endo/Heme/Allergies: Does not bruise/bleed easily.  Psychiatric/Behavioral: Negative for depression. The patient is not nervous/anxious and does not have insomnia.      MEDICAL HISTORY:  Past Medical History:  Diagnosis Date  . Anemia   . Cancer ([facility])   . Chronic kidney disease   . Depression   . Hypertension   . Neuromuscular disorder ([facility])    Nerve damage to left face/eye since around 2002.    SURGICAL HISTORY: Past Surgical History:  Procedure Laterality Date  . CYSTOSCOPY W/ RETROGRADES Bilateral 01/25/2018   Procedure: CYSTOSCOPY WITH RETROGRADE PYELOGRAM;  Surgeon: Stoioff, Scott C, MD;  [name];  Service: Urology;  Laterality: Bilateral;  . CYSTOSCOPY WITH STENT PLACEMENT Bilateral 01/25/2018   Procedure: CYSTOSCOPY WITH STENT PLACEMENT;  Surgeon: Stoioff, Scott C, MD;  [name];  Service: Urology;  Laterality: Bilateral;  . EYE SURGERY     Cornea transplants bilaterally & cataract surgery.  . TRANSURETHRAL RESECTION OF BLADDER TUMOR N/A 01/25/2018   Procedure: TRANSURETHRAL RESECTION OF BLADDER TUMOR (TURBT);  Surgeon: Stoioff, Scott C, MD;  [name];  Service: Urology;  [name];    SOCIAL HISTORY: lives in [place]; with wife; quit smoking 18 years ago; beer every 2 months or so.mechanic/retd.   Social History   Socioeconomic History  .   Marital status: Married    Spouse name: Not on file  . Number of children: Not on file  . Years of education: Not on file  . Highest education level: Not on file  Occupational  History  . Not on file  Social Needs  . Financial resource strain: Not hard at all  . Food insecurity:    Worry: Never true    Inability: Never true  . Transportation needs:    Medical: Not on file    Non-medical: Not on file  Tobacco Use  . Smoking status: Former Research scientist (life sciences)  . Smokeless tobacco: Current User    Types: Chew  . Tobacco comment: Stopped approximately 10 years ago.  Substance and Sexual Activity  . Alcohol use: Yes    Alcohol/week: 2.0 standard drinks    Types: 2 Cans of beer per week    Comment: Daily  . Drug use: Never  . Sexual activity: Not on file  Lifestyle  . Physical activity:    Days per week: Not on file    Minutes per session: Not on file  . Stress: Only a little  Relationships  . Social connections:    Talks on phone: Not on file    Gets together: Not on file    Attends religious service: Not on file    Active member of club or organization: Not on file    Attends meetings of clubs or organizations: Not on file    Relationship status: Not on file  . Intimate partner violence:    Fear of current or ex partner: Not on file    Emotionally abused: Not on file    Physically abused: Not on file    Forced sexual activity: Not on file  Other Topics Concern  . Not on file  Social History Narrative  . Not on file    FAMILY HISTORY: Family History  Problem Relation Age of Onset  . Prostate cancer Neg Hx   . Kidney cancer Neg Hx   . Bladder Cancer Neg Hx     ALLERGIES:  has No Known Allergies.  MEDICATIONS:  Current Outpatient Medications  Medication Sig Dispense Refill  . amLODipine (NORVASC) 5 MG tablet Take 1 tablet (5 mg total) by mouth daily. 30 tablet 3  . aspirin EC 81 MG tablet Take 81 mg by mouth daily.     . Multiple Vitamin (MULTIVITAMIN WITH MINERALS) TABS tablet Take 1 tablet by mouth daily. One-A-Day    . oxybutynin (DITROPAN) 5 MG tablet 1 tab tid prn frequency,urgency, bladder spasm (Patient taking differently: Take 5 mg by mouth  3 (three) times daily. ) 15 tablet 0  . prednisoLONE acetate (PRED FORTE) 1 % ophthalmic suspension Place 1 drop into both eyes daily.    . tamsulosin (FLOMAX) 0.4 MG CAPS capsule Take 0.4 mg by mouth at bedtime.     . diphenhydrAMINE (BENADRYL) 25 MG tablet Take 25 mg by mouth daily as needed (ALLERGIES).    Marland Kitchen docusate sodium (COLACE) 100 MG capsule Take 2 capsules (200 mg total) by mouth 2 (two) times daily as needed for mild constipation. (Patient not taking: Reported on 06/13/2018) 10 capsule 0  . HYDROcodone-acetaminophen (NORCO/VICODIN) 5-325 MG tablet Take 1 tablet by mouth every 8 (eight) hours as needed for moderate pain. (Patient not taking: Reported on 06/13/2018) 30 tablet 0   No current facility-administered medications for this visit.       Marland Kitchen  PHYSICAL EXAMINATION: ECOG PERFORMANCE STATUS: 1 - Symptomatic but  completely ambulatory  Vitals:   06/13/18 0850  BP: (!) 161/65  Pulse: 70  Resp: 18  Temp: (!) 97.5 F (36.4 C)   Filed Weights   06/13/18 0850  Weight: 129 lb (58.5 kg)    Physical Exam  Constitutional: He is oriented to person, place, and time.  Accompanied by son and wife.  Walking himself.  Thin built moderately nourished male patient.  Appears pale.  HENT:  Head: Normocephalic and atraumatic.  Mouth/Throat: Oropharynx is clear and moist. No oropharyngeal exudate.  Eyes: Pupils are equal, round, and reactive to light.  Chronic drooping of the left eyelid.  Neck: Normal range of motion. Neck supple.  Cardiovascular: Normal rate and regular rhythm.  Pulmonary/Chest: No respiratory distress. He has no wheezes.  Abdominal: Soft. Bowel sounds are normal. He exhibits no distension and no mass. There is no abdominal tenderness. There is no rebound and no guarding.  Musculoskeletal: Normal range of motion.        General: No tenderness or edema.  Neurological: He is alert and oriented to person, place, and time.  Skin: Skin is warm.  Psychiatric: Affect normal.      LABORATORY DATA:  I have reviewed the data as listed Lab Results  Component Value Date   WBC 5.4 06/13/2018   HGB 9.7 (L) 06/13/2018   HCT 29.8 (L) 06/13/2018   MCV 94.0 06/13/2018   PLT 206 06/13/2018   Recent Labs    05/02/18 0830 05/23/18 0819 06/13/18 0813  NA 138 142 141  K 4.3 4.3 3.7  CL 109 113* 110  CO2 25 25 25  GLUCOSE 96 111* 119*  BUN 28* 36* 25*  CREATININE 2.04* 2.25* 1.72*  CALCIUM 9.4 9.2 9.4  GFRNONAA 28* 26* 36*  GFRAA 33* 30* 42*  PROT 6.7 6.3* 6.7  ALBUMIN 4.0 3.8 3.7  AST 20 22 24  ALT 14 19 25  ALKPHOS 67 66 73  BILITOT 0.8 0.7 0.6    RADIOGRAPHIC STUDIES: I have personally reviewed the radiological images as listed and agreed with the findings in the report. No results found.  ASSESSMENT & PLAN:   Cancer of overlapping sites of bladder ([facility]) # High-grade transitional cell carcinoma of the bladder metastatic to retroperitoneal lymph node.  Stage IV;  S/p cycle #3- Currently on Tecentriq; 11th NOV 2019- CT- improved RP LN. Stable.   # Proceed with Tecentriq #6 today. Labs today reviewed;  acceptable for treatment today.   # Anemia sec to CKD/ on Iv iron.hb- 9.7; stable;  continue IV iron every 3 weeks.  # Urinary Obstruction- improved on Continue flomax 0.4. stable.   # HTN- labile-given the orthostasis hold off increasing the antihypertensives at this time.  Monitor closely goal would be around 140 systolic.  # DISPOSITION: # Tecentriq/venofer today # Follow-up in 3 weeks labs-cbc/cmp- MD Tecentriq IV venofer-Dr.B   All questions were answered. The patient knows to call the clinic with any problems, questions or concerns.     R , MD 06/13/2018 9:14 AM    

## 2018-06-13 NOTE — Assessment & Plan Note (Addendum)
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  Stage IV;  S/p cycle #3- Currently on Tecentriq; 11th NOV 2019- CT- improved RP LN. Stable.   # Proceed with Tecentriq #6 today. Labs today reviewed;  acceptable for treatment today.  We will repeat imaging towards mid to late February.  # Anemia sec to CKD/ on Iv iron.hb- 9.7; stable;  continue IV iron every 3 weeks.  # Urinary Obstruction- improved on Continue flomax 0.4. stable.   # HTN- labile-given the orthostasis hold off increasing the antihypertensives at this time.  Monitor closely goal would be around 915 systolic.  # DISPOSITION: # Tecentriq/venofer today # Follow-up in 3 weeks labs-cbc/cmp- MD Tecentriq IV venofer-Dr.B

## 2018-06-18 ENCOUNTER — Other Ambulatory Visit: Payer: Self-pay | Admitting: Internal Medicine

## 2018-06-18 DIAGNOSIS — E0590 Thyrotoxicosis, unspecified without thyrotoxic crisis or storm: Secondary | ICD-10-CM

## 2018-07-04 ENCOUNTER — Inpatient Hospital Stay: Payer: Medicare HMO

## 2018-07-04 ENCOUNTER — Inpatient Hospital Stay (HOSPITAL_BASED_OUTPATIENT_CLINIC_OR_DEPARTMENT_OTHER): Payer: Medicare HMO | Admitting: Internal Medicine

## 2018-07-04 ENCOUNTER — Other Ambulatory Visit: Payer: Self-pay

## 2018-07-04 ENCOUNTER — Encounter: Payer: Self-pay | Admitting: Internal Medicine

## 2018-07-04 VITALS — BP 163/62 | HR 59 | Temp 96.1°F | Resp 18 | Ht 68.0 in | Wt 125.0 lb

## 2018-07-04 DIAGNOSIS — E0590 Thyrotoxicosis, unspecified without thyrotoxic crisis or storm: Secondary | ICD-10-CM

## 2018-07-04 DIAGNOSIS — N183 Chronic kidney disease, stage 3 unspecified: Secondary | ICD-10-CM

## 2018-07-04 DIAGNOSIS — G8929 Other chronic pain: Secondary | ICD-10-CM | POA: Diagnosis not present

## 2018-07-04 DIAGNOSIS — M545 Low back pain: Secondary | ICD-10-CM | POA: Diagnosis not present

## 2018-07-04 DIAGNOSIS — C678 Malignant neoplasm of overlapping sites of bladder: Secondary | ICD-10-CM

## 2018-07-04 DIAGNOSIS — I129 Hypertensive chronic kidney disease with stage 1 through stage 4 chronic kidney disease, or unspecified chronic kidney disease: Secondary | ICD-10-CM

## 2018-07-04 DIAGNOSIS — R5382 Chronic fatigue, unspecified: Secondary | ICD-10-CM

## 2018-07-04 DIAGNOSIS — D631 Anemia in chronic kidney disease: Secondary | ICD-10-CM

## 2018-07-04 DIAGNOSIS — Z87891 Personal history of nicotine dependence: Secondary | ICD-10-CM | POA: Diagnosis not present

## 2018-07-04 DIAGNOSIS — Z5112 Encounter for antineoplastic immunotherapy: Secondary | ICD-10-CM | POA: Diagnosis not present

## 2018-07-04 LAB — CBC WITH DIFFERENTIAL/PLATELET
Abs Immature Granulocytes: 0.01 10*3/uL (ref 0.00–0.07)
Basophils Absolute: 0 10*3/uL (ref 0.0–0.1)
Basophils Relative: 1 %
Eosinophils Absolute: 0.7 10*3/uL — ABNORMAL HIGH (ref 0.0–0.5)
Eosinophils Relative: 11 %
HCT: 30.6 % — ABNORMAL LOW (ref 39.0–52.0)
Hemoglobin: 10.1 g/dL — ABNORMAL LOW (ref 13.0–17.0)
Immature Granulocytes: 0 %
Lymphocytes Relative: 36 %
Lymphs Abs: 2.3 10*3/uL (ref 0.7–4.0)
MCH: 31.2 pg (ref 26.0–34.0)
MCHC: 33 g/dL (ref 30.0–36.0)
MCV: 94.4 fL (ref 80.0–100.0)
MONO ABS: 0.5 10*3/uL (ref 0.1–1.0)
Monocytes Relative: 7 %
Neutro Abs: 2.9 10*3/uL (ref 1.7–7.7)
Neutrophils Relative %: 45 %
Platelets: 214 10*3/uL (ref 150–400)
RBC: 3.24 MIL/uL — AB (ref 4.22–5.81)
RDW: 12.5 % (ref 11.5–15.5)
WBC: 6.3 10*3/uL (ref 4.0–10.5)
nRBC: 0 % (ref 0.0–0.2)

## 2018-07-04 LAB — COMPREHENSIVE METABOLIC PANEL
ALT: 25 U/L (ref 0–44)
ANION GAP: 4 — AB (ref 5–15)
AST: 28 U/L (ref 15–41)
Albumin: 3.8 g/dL (ref 3.5–5.0)
Alkaline Phosphatase: 81 U/L (ref 38–126)
BUN: 32 mg/dL — ABNORMAL HIGH (ref 8–23)
CO2: 26 mmol/L (ref 22–32)
Calcium: 9.3 mg/dL (ref 8.9–10.3)
Chloride: 112 mmol/L — ABNORMAL HIGH (ref 98–111)
Creatinine, Ser: 1.77 mg/dL — ABNORMAL HIGH (ref 0.61–1.24)
GFR calc Af Amer: 40 mL/min — ABNORMAL LOW (ref >60)
GFR calc non Af Amer: 35 mL/min — ABNORMAL LOW (ref >60)
Glucose, Bld: 110 mg/dL — ABNORMAL HIGH (ref 70–99)
Potassium: 4 mmol/L (ref 3.5–5.1)
SODIUM: 142 mmol/L (ref 135–145)
TOTAL PROTEIN: 6.8 g/dL (ref 6.5–8.1)
Total Bilirubin: 0.7 mg/dL (ref 0.3–1.2)

## 2018-07-04 MED ORDER — SODIUM CHLORIDE 0.9 % IV SOLN
1200.0000 mg | Freq: Once | INTRAVENOUS | Status: AC
  Administered 2018-07-04: 1200 mg via INTRAVENOUS
  Filled 2018-07-04: qty 20

## 2018-07-04 MED ORDER — IRON SUCROSE 20 MG/ML IV SOLN
200.0000 mg | Freq: Once | INTRAVENOUS | Status: AC
  Administered 2018-07-04: 200 mg via INTRAVENOUS
  Filled 2018-07-04: qty 10

## 2018-07-04 MED ORDER — SODIUM CHLORIDE 0.9 % IV SOLN
Freq: Once | INTRAVENOUS | Status: AC
  Administered 2018-07-04: 10:00:00 via INTRAVENOUS
  Filled 2018-07-04: qty 250

## 2018-07-04 NOTE — Progress Notes (Signed)
[place] CONSULT NOTE  Patient Care Team: [name], MD as PCP - General (Internal Medicine)  CHIEF COMPLAINTS/PURPOSE OF CONSULTATION:  Bladder cancer  #  Oncology History   # AUG 2019-TRANSITIONAL CELL BLADDER CA [~ 4cm tumor] s/p cystoscopy [Dr.Stoiff]  with extensive angiolymphatic invasion; [name]; [name]; mediastinal uptake; right pubic rami uptake.  # 19th ep 2019- Tecentrir  # CKD stage III-IV [creat 2.5]  # Molecular testing- PDL-1 CPS- 20%; NO other targets**  DIAGNOSIS: Bladder ca  STAGE:   IV  ;GOALS: palliative  CURRENT/MOST RECENT THERAPY:Tecentriq        Cancer of overlapping sites of bladder ([place])   02/28/2018 -  Chemotherapy    The patient had atezolizumab (TECENTRIQ) 1,[name], 1,200 mg, Intravenous, Once, 7 of 8 cycles Administration: 1,200 mg (02/28/2018), 1,200 mg (03/21/2018), 1,200 mg (04/11/2018), 1,200 mg (05/02/2018), 1,200 mg (06/13/2018), 1,200 mg (05/23/2018), 1,200 mg (07/04/2018)  for chemotherapy treatment.       HISTORY OF PRESENTING ILLNESS: Edward Cummings 85 y.o.  male with metastatic transitional carcinoma of the bladder currently on Tecentriq is here for follow-up.  Patient denies any nausea vomiting abdominal pain constipation.  Continues to have chronic back pain.  Chronic mild fatigue.  Not any worse.   Review of Systems  Constitutional: Positive for malaise/fatigue. Negative for chills, diaphoresis and fever.  HENT: Negative for nosebleeds and sore throat.   Eyes: Negative for double vision.  Respiratory: Negative for cough, hemoptysis, sputum production, shortness of breath and wheezing.   Cardiovascular: Negative for chest pain, palpitations, orthopnea and leg swelling.  Gastrointestinal: Negative for abdominal pain, blood in stool, diarrhea,  heartburn, melena, nausea and vomiting.  Genitourinary: Negative for dysuria, frequency and urgency.  Musculoskeletal: Positive for back pain. Negative for joint pain.  Skin: Negative.  Negative for itching and rash.  Neurological: Positive for dizziness. Negative for tingling, focal weakness, weakness and headaches.  Endo/Heme/Allergies: Does not bruise/bleed easily.  Psychiatric/Behavioral: Negative for depression. The patient is not nervous/anxious and does not have insomnia.      MEDICAL HISTORY:  Past Medical History:  Diagnosis Date  . Anemia   . Cancer ([place])   . Chronic kidney disease   . Depression   . Hypertension   . Neuromuscular disorder ([place])    Nerve damage to left face/eye since around 2002.    SURGICAL HISTORY: Past Surgical History:  Procedure Laterality Date  . CYSTOSCOPY W/ RETROGRADES Bilateral 01/25/2018   Procedure: CYSTOSCOPY WITH RETROGRADE PYELOGRAM;  [name], MD;  [name];  Service: Urology;  Laterality: Bilateral;  . CYSTOSCOPY WITH STENT PLACEMENT Bilateral 01/25/2018   Procedure: CYSTOSCOPY WITH STENT PLACEMENT;  [name], MD;  [name];  Service: Urology;  Laterality: Bilateral;  . EYE SURGERY     Cornea transplants bilaterally & cataract surgery.  . TRANSURETHRAL RESECTION OF BLADDER TUMOR N/A 01/25/2018   Procedure: TRANSURETHRAL RESECTION OF BLADDER TUMOR (TURBT);  [name], MD;  [name];  Service: Urology;  [name];    SOCIAL HISTORY: lives in [place]; with wife; quit smoking 18 years ago; beer every 2 months or so.mechanic/retd.   Social History   Socioeconomic History  . Marital status: Married    Spouse name: Not on file  . Number of children: Not on  file  . Years of education: Not on file  . Highest education level: Not on file  Occupational History  . Not on file  Social Needs  . Financial resource strain: Not hard at all  . Food  insecurity:    Worry: Never true    Inability: Never true  . Transportation needs:    Medical: Not on file    Non-medical: Not on file  Tobacco Use  . Smoking status: Former Research scientist (life sciences)  . Smokeless tobacco: Current User    Types: Chew  . Tobacco comment: Stopped approximately 10 years ago.  Substance and Sexual Activity  . Alcohol use: Yes    Alcohol/week: 2.0 standard drinks    Types: 2 Cans of beer per week    Comment: Daily  . Drug use: Never  . Sexual activity: Not on file  Lifestyle  . Physical activity:    Days per week: Not on file    Minutes per session: Not on file  . Stress: Only a little  Relationships  . Social connections:    Talks on phone: Not on file    Gets together: Not on file    Attends religious service: Not on file    Active member of club or organization: Not on file    Attends meetings of clubs or organizations: Not on file    Relationship status: Not on file  . Intimate partner violence:    Fear of current or ex partner: Not on file    Emotionally abused: Not on file    Physically abused: Not on file    Forced sexual activity: Not on file  Other Topics Concern  . Not on file  Social History Narrative  . Not on file    FAMILY HISTORY: Family History  Problem Relation Age of Onset  . Prostate cancer Neg Hx   . Kidney cancer Neg Hx   . Bladder Cancer Neg Hx     ALLERGIES:  has No Known Allergies.  MEDICATIONS:  Current Outpatient Medications  Medication Sig Dispense Refill  . amLODipine (NORVASC) 5 MG tablet Take 1 tablet (5 mg total) by mouth daily. 30 tablet 3  . aspirin EC 81 MG tablet Take 81 mg by mouth daily.     . diphenhydrAMINE (BENADRYL) 25 MG tablet Take 25 mg by mouth daily as needed (ALLERGIES).    . Multiple Vitamin (MULTIVITAMIN WITH MINERALS) TABS tablet Take 1 tablet by mouth daily. One-A-Day    . oxybutynin (DITROPAN) 5 MG tablet 1 tab tid prn frequency,urgency, bladder spasm (Patient taking differently: Take 5 mg by mouth  3 (three) times daily. ) 15 tablet 0  . prednisoLONE acetate (PRED FORTE) 1 % ophthalmic suspension Place 1 drop into both eyes daily.    . tamsulosin (FLOMAX) 0.4 MG CAPS capsule Take 0.4 mg by mouth at bedtime.     . docusate sodium (COLACE) 100 MG capsule Take 2 capsules (200 mg total) by mouth 2 (two) times daily as needed for mild constipation. (Patient not taking: Reported on 06/13/2018) 10 capsule 0  . HYDROcodone-acetaminophen (NORCO/VICODIN) 5-325 MG tablet Take 1 tablet by mouth every 8 (eight) hours as needed for moderate pain. (Patient not taking: Reported on 06/13/2018) 30 tablet 0   No current facility-administered medications for this visit.       Marland Kitchen  PHYSICAL EXAMINATION: ECOG PERFORMANCE STATUS: 1 - Symptomatic but completely ambulatory  Vitals:   07/04/18 0853  BP: (!) 163/62  Pulse: (!) 59  Resp:  18  Temp: (!) 96.1 F (35.6 C)   Filed Weights   07/04/18 0853  Weight: 125 lb (56.7 kg)    Physical Exam  Constitutional: He is oriented to person, place, and time.  Accompanied by son and wife.  Walking himself.  Thin built moderately nourished male patient.  Appears pale.  HENT:  Head: Normocephalic and atraumatic.  Mouth/Throat: Oropharynx is clear and moist. No oropharyngeal exudate.  Eyes: Pupils are equal, round, and reactive to light.  Chronic drooping of the left eyelid.  Neck: Normal range of motion. Neck supple.  Cardiovascular: Normal rate and regular rhythm.  Pulmonary/Chest: No respiratory distress. He has no wheezes.  Abdominal: Soft. Bowel sounds are normal. He exhibits no distension and no mass. There is no abdominal tenderness. There is no rebound and no guarding.  Musculoskeletal: Normal range of motion.        General: No tenderness or edema.  Neurological: He is alert and oriented to person, place, and time.  Skin: Skin is warm.  Psychiatric: Affect normal.     LABORATORY DATA:  I have reviewed the data as listed Lab Results  Component  Value Date   WBC 6.3 07/04/2018   HGB 10.1 (L) 07/04/2018   HCT 30.6 (L) 07/04/2018   MCV 94.4 07/04/2018   PLT 214 07/04/2018   Recent Labs    05/23/18 0819 06/13/18 0813 07/04/18 0812  NA 142 141 142  K 4.3 3.7 4.0  CL 113* 110 112*  CO2 '25 25 26  ' GLUCOSE 111* 119* 110*  BUN 36* 25* 32*  CREATININE 2.25* 1.72* 1.77*  CALCIUM 9.2 9.4 9.3  GFRNONAA 26* 36* 35*  GFRAA 30* 42* 40*  PROT 6.3* 6.7 6.8  ALBUMIN 3.8 3.7 3.8  AST '22 24 28  ' ALT '19 25 25  ' ALKPHOS 66 73 81  BILITOT 0.7 0.6 0.7    RADIOGRAPHIC STUDIES: I have personally reviewed the radiological images as listed and agreed with the findings in the report. Ct Abdomen Pelvis Wo Contrast  Result Date: 07/23/2018 CLINICAL DATA:  Follow-up bladder cancer EXAM: CT ABDOMEN AND PELVIS WITHOUT CONTRAST TECHNIQUE: Multidetector CT imaging of the abdomen and pelvis was performed following the standard protocol without IV contrast. COMPARISON:  04/24/2018 FINDINGS: Motion degraded images. Lower chest: Lung bases are clear. Hepatobiliary: Unenhanced liver is grossly unremarkable. Gallbladder is decompressed. No intrahepatic or extrahepatic ductal dilatation. Pancreas: Within normal limits. Spleen: Within normal limits. Adrenals/Urinary Tract: Adrenal glands are within normal limits. Two lower pole renal cysts, measuring up to 1.8 cm (series 2/image 25), poorly evaluated on unenhanced CT. 12 mm hyperdense./hemorrhagic lesion in the lateral left upper kidney. Double pigtail ureteral stents in the proximal ureters and terminating in the bladder. No hydronephrosis. Thick-walled bladder, although underdistended. No bladder mass is evident on unenhanced CT. Stomach/Bowel: Stomach is within normal limits. No evidence of bowel obstruction. Normal appendix (series 2/image 45). Mild to moderate left colonic stool burden. Vascular/Lymphatic: No evidence of abdominal aortic aneurysm. Atherosclerotic calcifications of the abdominal aorta and branch  vessels. No suspicious abdominopelvic lymphadenopathy. Reproductive: Mild prostatomegaly. Other: No abdominopelvic ascites. Musculoskeletal: Degenerative changes of the lumbar spine. IMPRESSION: Thick-walled bladder.  No bladder mass is evident on unenhanced CT. Indwelling double pigtail ureteral stents.  No hydronephrosis. No findings specific for metastatic disease. Additional ancillary findings as above. Electronically Signed   By: Julian Hy M.D.   On: 07/23/2018 13:02    ASSESSMENT & PLAN:   Cancer of overlapping sites of bladder [facility]) #  High-grade transitional cell carcinoma of the bladder metastatic to retroperitoneal lymph node.  Stage IV;  S/p cycle #3- Currently on Tecentriq; 11th NOV 2019- CT- improved RP LN. Stable.   # Proceed with Tecentriq #7 today. Labs today reviewed;  acceptable for treatment today.  Will order imaging today.  # Anemia sec to CKD/ on Iv iron.hb- 10 stable.  # Urinary Obstruction/CKD- improved on Continue flomax 0.4.stable.   # HTN- labile-given the orthostasis hold off increasing the antihypertensives at this time.  STABLE.  # DISPOSITION: # Tecentriq/venofer today # Follow-up in 3 weeks labs-cbc/cmp- MD Tecentriq IV; CT scan prior-Dr.B   All questions were answered. The patient knows to call the clinic with any problems, questions or concerns.    Cammie Sickle, MD 07/25/2018 7:35 AM

## 2018-07-04 NOTE — Assessment & Plan Note (Signed)
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  Stage IV;  S/p cycle #3- Currently on Tecentriq; 11th NOV 2019- CT- improved RP LN. Stable.   # Proceed with Tecentriq #7 today. Labs today reviewed;  acceptable for treatment today.  Will order imaging today.  # Anemia sec to CKD/ on Iv iron.hb- 10 stable.  # Urinary Obstruction/CKD- improved on Continue flomax 0.4.stable.   # HTN- labile-given the orthostasis hold off increasing the antihypertensives at this time.  STABLE.  # DISPOSITION: # Tecentriq/venofer today # Follow-up in 3 weeks labs-cbc/cmp- MD Tecentriq IV; CT scan prior-Dr.B

## 2018-07-05 LAB — THYROID PANEL WITH TSH
Free Thyroxine Index: 3.6 (ref 1.2–4.9)
T3 Uptake Ratio: 35 % (ref 24–39)
T4, Total: 10.4 ug/dL (ref 4.5–12.0)
TSH: <0.006 u[IU]/mL — ABNORMAL LOW (ref 0.450–4.500)

## 2018-07-23 ENCOUNTER — Ambulatory Visit
Admission: RE | Admit: 2018-07-23 | Discharge: 2018-07-23 | Disposition: A | Discharge status: Home / Self Care | Payer: Medicare HMO | Source: Ambulatory Visit | Attending: Internal Medicine | Admitting: Internal Medicine

## 2018-07-23 DIAGNOSIS — C678 Malignant neoplasm of overlapping sites of bladder: Secondary | ICD-10-CM | POA: Insufficient documentation

## 2018-07-23 DIAGNOSIS — C679 Malignant neoplasm of bladder, unspecified: Secondary | ICD-10-CM | POA: Diagnosis not present

## 2018-07-23 IMAGING — CT CT ABD-PELV W/O
2 of 4 series · 16 of 46 positions shown, 18 images · non-contrast
Comparison: [DATE]

CLINICAL DATA: Follow-up bladder cancer

EXAM:
CT ABDOMEN AND PELVIS WITHOUT CONTRAST
TECHNIQUE: Multidetector CT imaging of the abdomen and pelvis was performed
following the standard protocol without IV contrast.

[Series 2: routine abd/pel wo · axial · 0.65mm/px · z∈[-946,-586]mm · 13 of 80 slices shown, 15 images]
[im 4/80  soft-tissue]
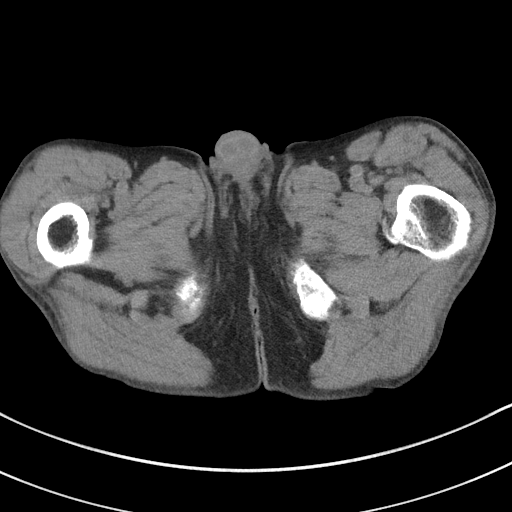
[im 4/80  bone]
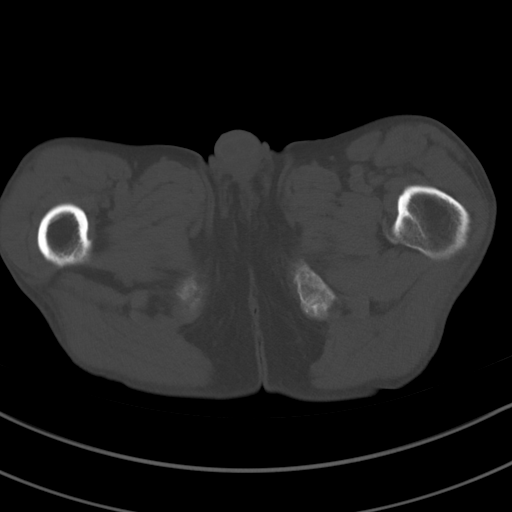
[im 10/80  soft-tissue]
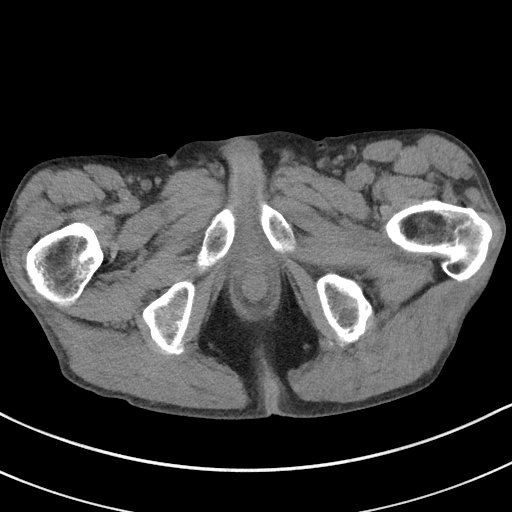
[im 16/80  soft-tissue]
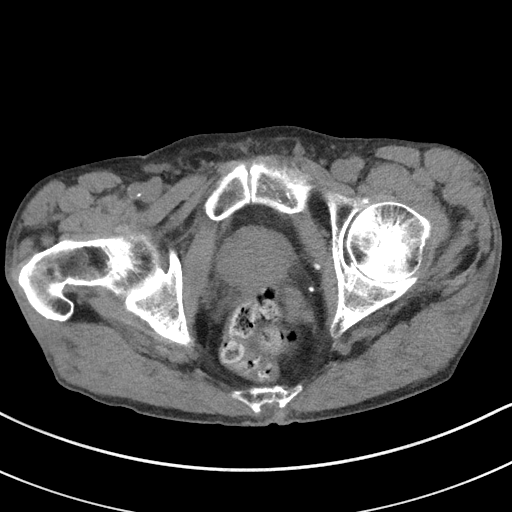
[im 22/80  soft-tissue]
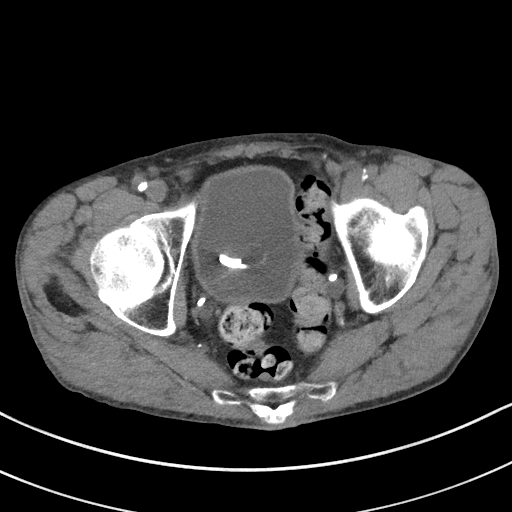
[im 28/80  soft-tissue]
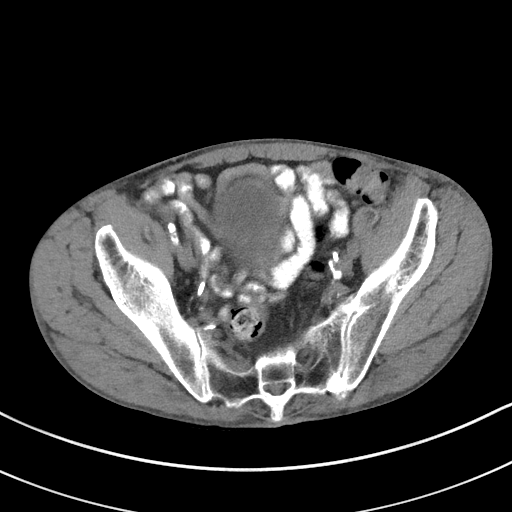
[im 34/80  soft-tissue]
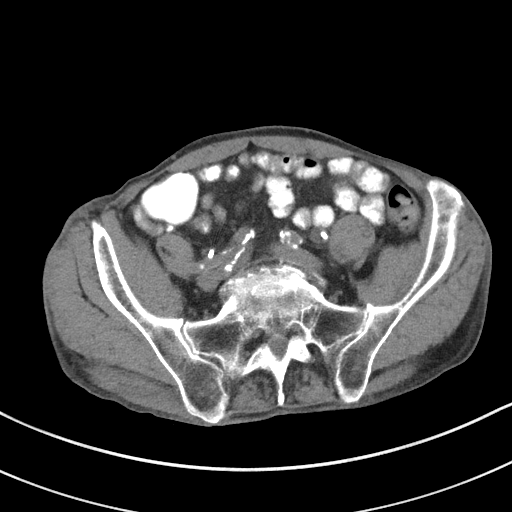
[im 40/80  soft-tissue]
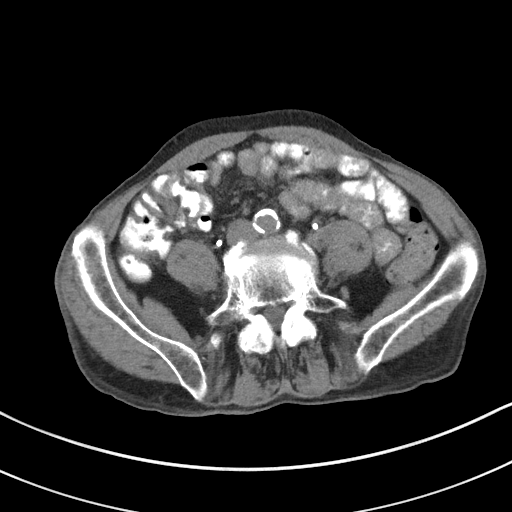
[im 46/80  soft-tissue]
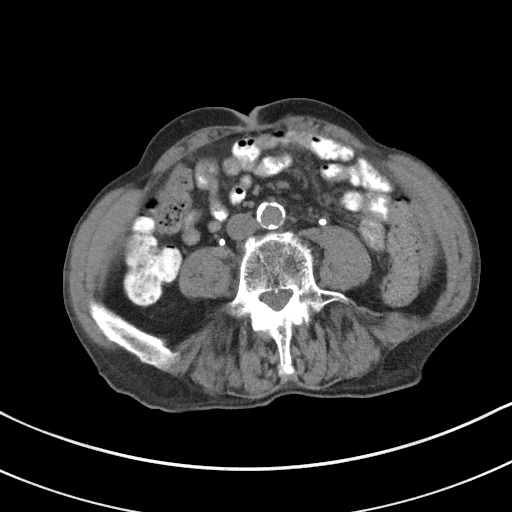
[im 52/80  soft-tissue]
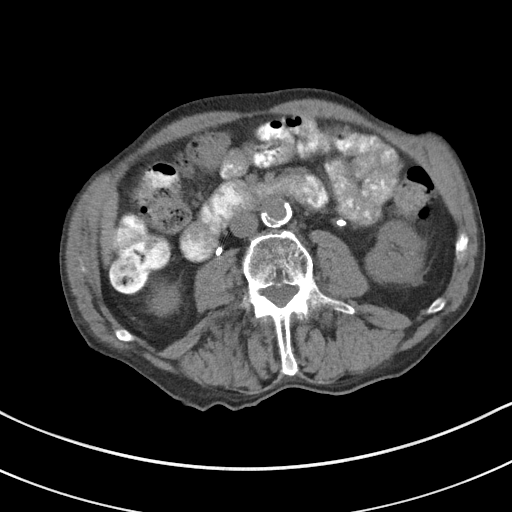
[im 52/80  bone]
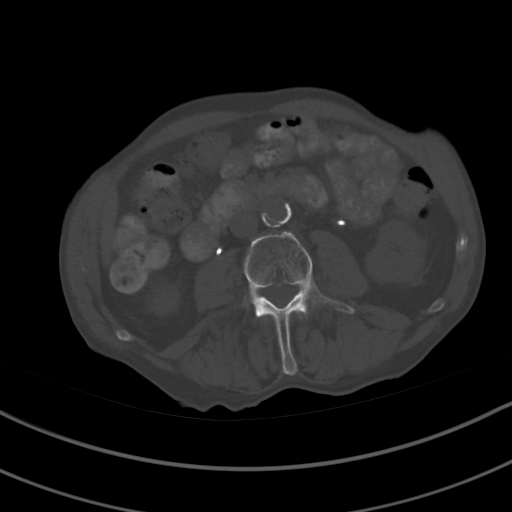
[im 58/80  soft-tissue]
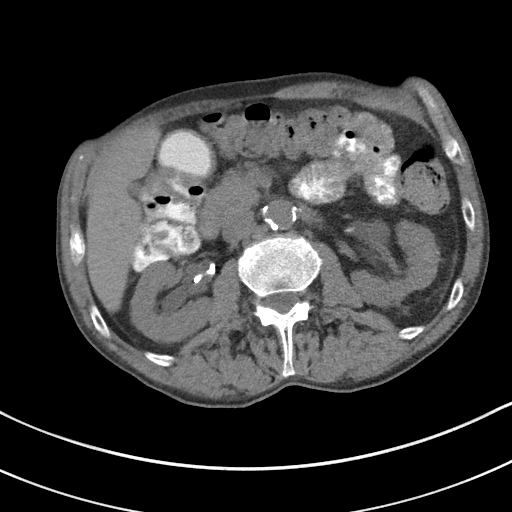
[im 64/80  soft-tissue]
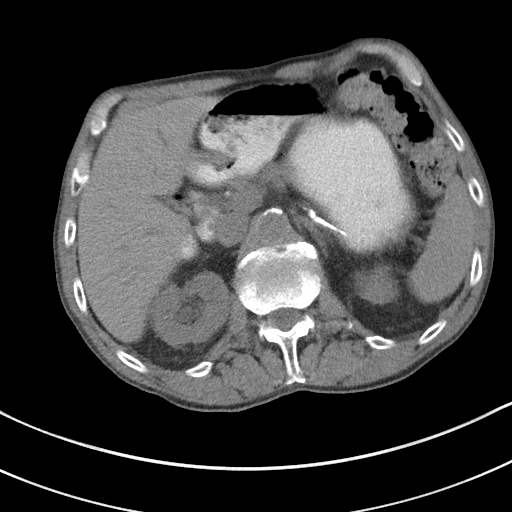
[im 70/80  soft-tissue]
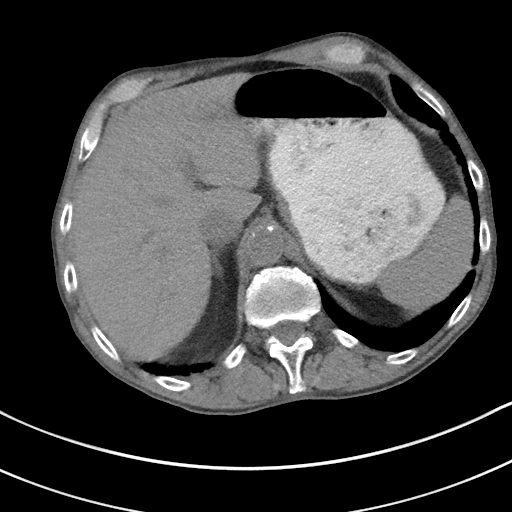
[im 76/80  soft-tissue]
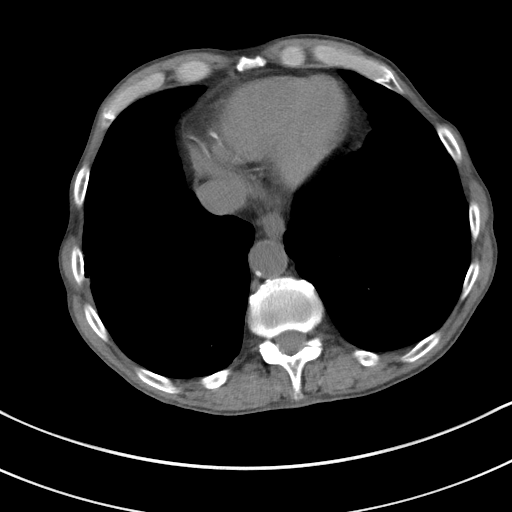

[Series 5: coronal st · coronal · 0.65mm/px · 3 of 86 slices shown]
[im 29/86  soft-tissue]
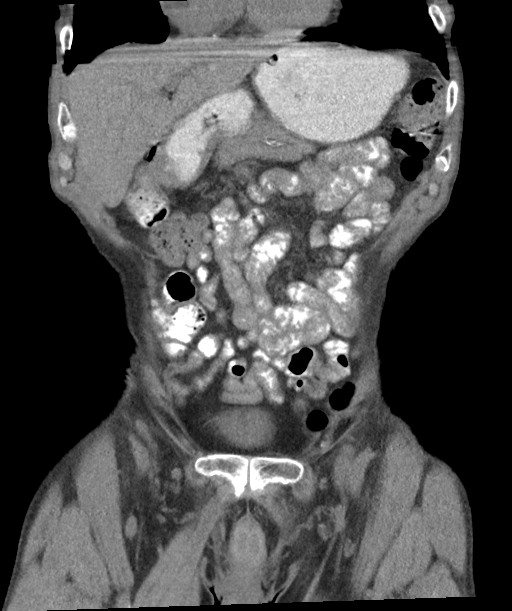
[im 38/86  soft-tissue]
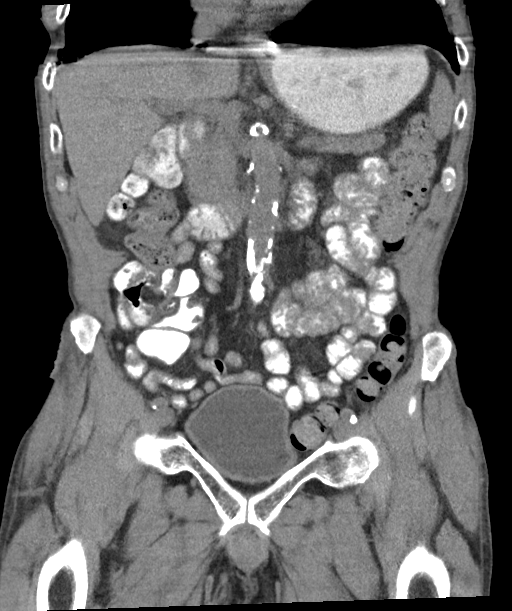
[im 48/86  soft-tissue]
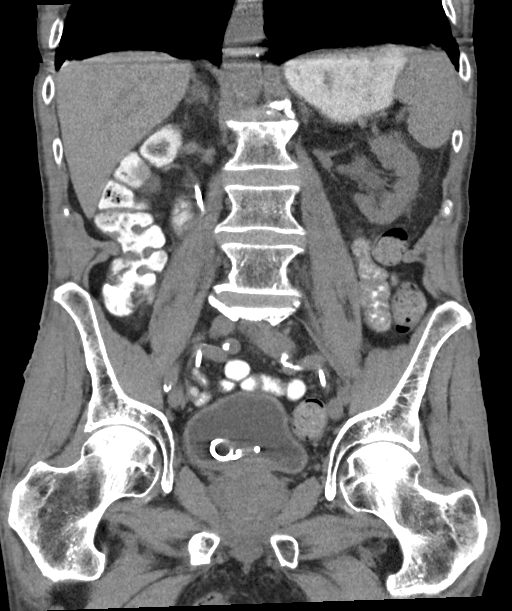

[16 of 46 positions shown; findings below may reference images not displayed]

FINDINGS: Motion degraded images.

Lower chest: Lung bases are clear.

Hepatobiliary: Unenhanced liver is grossly unremarkable.

Gallbladder is decompressed. No intrahepatic or extrahepatic ductal
dilatation.

Pancreas: Within normal limits.

Spleen: Within normal limits.

Adrenals/Urinary Tract: Adrenal glands are within normal limits.

Two lower pole renal cysts, measuring up to 1.8 cm (series 2/image
25), poorly evaluated on unenhanced CT. 12 mm
hyperdense./hemorrhagic lesion in the lateral left upper kidney.
Double pigtail ureteral stents in the proximal ureters and
terminating in the bladder. No hydronephrosis.

Thick-walled bladder, although underdistended. No bladder mass is
evident on unenhanced CT.

Stomach/Bowel: Stomach is within normal limits.

No evidence of bowel obstruction.

Normal appendix (series 2/image 45).

Mild to moderate left colonic stool burden.

Vascular/Lymphatic: No evidence of abdominal aortic aneurysm.

Atherosclerotic calcifications of the abdominal aorta and branch
vessels.

No suspicious abdominopelvic lymphadenopathy.

Reproductive: Mild prostatomegaly.

Other: No abdominopelvic ascites.

Musculoskeletal: Degenerative changes of the lumbar spine.
IMPRESSION: Thick-walled bladder.  No bladder mass is evident on unenhanced CT.

Indwelling double pigtail ureteral stents.  No hydronephrosis.

No findings specific for metastatic disease.

Additional ancillary findings as above.

## 2018-07-25 ENCOUNTER — Encounter: Payer: Self-pay | Admitting: Internal Medicine

## 2018-07-25 ENCOUNTER — Inpatient Hospital Stay (HOSPITAL_BASED_OUTPATIENT_CLINIC_OR_DEPARTMENT_OTHER): Payer: Medicare HMO | Admitting: Internal Medicine

## 2018-07-25 ENCOUNTER — Inpatient Hospital Stay: Payer: Medicare HMO

## 2018-07-25 ENCOUNTER — Other Ambulatory Visit: Payer: Self-pay

## 2018-07-25 ENCOUNTER — Inpatient Hospital Stay: Payer: Medicare HMO | Attending: Internal Medicine

## 2018-07-25 DIAGNOSIS — D631 Anemia in chronic kidney disease: Secondary | ICD-10-CM | POA: Insufficient documentation

## 2018-07-25 DIAGNOSIS — C678 Malignant neoplasm of overlapping sites of bladder: Secondary | ICD-10-CM | POA: Diagnosis not present

## 2018-07-25 DIAGNOSIS — Z5112 Encounter for antineoplastic immunotherapy: Secondary | ICD-10-CM | POA: Insufficient documentation

## 2018-07-25 DIAGNOSIS — Z7189 Other specified counseling: Secondary | ICD-10-CM | POA: Insufficient documentation

## 2018-07-25 DIAGNOSIS — Z87891 Personal history of nicotine dependence: Secondary | ICD-10-CM | POA: Diagnosis not present

## 2018-07-25 DIAGNOSIS — Z0000 Encounter for general adult medical examination without abnormal findings: Secondary | ICD-10-CM | POA: Insufficient documentation

## 2018-07-25 DIAGNOSIS — N139 Obstructive and reflux uropathy, unspecified: Secondary | ICD-10-CM | POA: Diagnosis not present

## 2018-07-25 DIAGNOSIS — G8929 Other chronic pain: Secondary | ICD-10-CM

## 2018-07-25 DIAGNOSIS — M545 Low back pain: Secondary | ICD-10-CM | POA: Diagnosis not present

## 2018-07-25 DIAGNOSIS — N183 Chronic kidney disease, stage 3 (moderate): Secondary | ICD-10-CM | POA: Diagnosis not present

## 2018-07-25 DIAGNOSIS — I129 Hypertensive chronic kidney disease with stage 1 through stage 4 chronic kidney disease, or unspecified chronic kidney disease: Secondary | ICD-10-CM

## 2018-07-25 DIAGNOSIS — R5383 Other fatigue: Secondary | ICD-10-CM | POA: Diagnosis not present

## 2018-07-25 LAB — CBC WITH DIFFERENTIAL/PLATELET
Abs Immature Granulocytes: 0.01 10*3/uL (ref 0.00–0.07)
BASOS ABS: 0.1 10*3/uL (ref 0.0–0.1)
Basophils Relative: 1 %
EOS PCT: 14 %
Eosinophils Absolute: 0.9 10*3/uL — ABNORMAL HIGH (ref 0.0–0.5)
HCT: 31.1 % — ABNORMAL LOW (ref 39.0–52.0)
Hemoglobin: 10.3 g/dL — ABNORMAL LOW (ref 13.0–17.0)
Immature Granulocytes: 0 %
Lymphocytes Relative: 34 %
Lymphs Abs: 2.2 10*3/uL (ref 0.7–4.0)
MCH: 31.6 pg (ref 26.0–34.0)
MCHC: 33.1 g/dL (ref 30.0–36.0)
MCV: 95.4 fL (ref 80.0–100.0)
Monocytes Absolute: 0.5 10*3/uL (ref 0.1–1.0)
Monocytes Relative: 7 %
NRBC: 0 % (ref 0.0–0.2)
Neutro Abs: 2.9 10*3/uL (ref 1.7–7.7)
Neutrophils Relative %: 44 %
Platelets: 220 10*3/uL (ref 150–400)
RBC: 3.26 MIL/uL — ABNORMAL LOW (ref 4.22–5.81)
RDW: 12.8 % (ref 11.5–15.5)
WBC: 6.5 10*3/uL (ref 4.0–10.5)

## 2018-07-25 LAB — COMPREHENSIVE METABOLIC PANEL
ALT: 26 U/L (ref 0–44)
AST: 28 U/L (ref 15–41)
Albumin: 3.9 g/dL (ref 3.5–5.0)
Alkaline Phosphatase: 85 U/L (ref 38–126)
Anion gap: 4 — ABNORMAL LOW (ref 5–15)
BUN: 25 mg/dL — ABNORMAL HIGH (ref 8–23)
CO2: 25 mmol/L (ref 22–32)
Calcium: 9 mg/dL (ref 8.9–10.3)
Chloride: 112 mmol/L — ABNORMAL HIGH (ref 98–111)
Creatinine, Ser: 1.82 mg/dL — ABNORMAL HIGH (ref 0.61–1.24)
GFR calc Af Amer: 39 mL/min — ABNORMAL LOW (ref >60)
GFR calc non Af Amer: 34 mL/min — ABNORMAL LOW (ref >60)
Glucose, Bld: 114 mg/dL — ABNORMAL HIGH (ref 70–99)
Potassium: 4.3 mmol/L (ref 3.5–5.1)
Sodium: 141 mmol/L (ref 135–145)
Total Bilirubin: 0.6 mg/dL (ref 0.3–1.2)
Total Protein: 6.9 g/dL (ref 6.5–8.1)

## 2018-07-25 MED ORDER — SODIUM CHLORIDE 0.9 % IV SOLN
1200.0000 mg | Freq: Once | INTRAVENOUS | Status: AC
  Administered 2018-07-25: 1200 mg via INTRAVENOUS
  Filled 2018-07-25: qty 20

## 2018-07-25 MED ORDER — SODIUM CHLORIDE 0.9 % IV SOLN
Freq: Once | INTRAVENOUS | Status: AC
  Administered 2018-07-25: 10:00:00 via INTRAVENOUS
  Filled 2018-07-25: qty 250

## 2018-07-25 NOTE — Progress Notes (Signed)
[facility] CONSULT NOTE  Patient Care Team: [name], MD as PCP - General (Internal Medicine)  CHIEF COMPLAINTS/PURPOSE OF CONSULTATION:  Bladder cancer  #  Oncology History   # AUG 2019-TRANSITIONAL CELL BLADDER CA [~ 4cm tumor] s/p cystoscopy [Dr.Stoiff]  with extensive angiolymphatic invasion; [name]; [name]; mediastinal uptake; right pubic rami uptake.  # 19th ep 2019- Tecentrir  # CKD stage III-IV [creat 2.5]  # Molecular testing- PDL-1 CPS- 20%; NO other targets**  DIAGNOSIS: Bladder ca  STAGE:   IV  ;GOALS: palliative  CURRENT/MOST RECENT THERAPY:Tecentriq        Cancer of overlapping sites of bladder ([place])   02/28/2018 -  Chemotherapy    The patient had atezolizumab (TECENTRIQ) 1,[name], 1,200 mg, Intravenous, Once, 7 of 11 cycles Administration: 1,200 mg (02/28/2018), 1,200 mg (03/21/2018), 1,200 mg (04/11/2018), 1,200 mg (05/02/2018), 1,200 mg (06/13/2018), 1,200 mg (05/23/2018), 1,200 mg (07/04/2018)  for chemotherapy treatment.       HISTORY OF PRESENTING ILLNESS: Edward Cummings 85 y.o.  male with metastatic transitional carcinoma of the bladder currently on Tecentriq is here for follow-up/ review the results of CT scan.  Patient appetite is good.  Denies any nausea vomiting.  No palpitations.  Continues to have chronic back pain.  Mild to moderate fatigue.  Denies any heat intolerance.  Denies any palpitations.  Appetite is good.   Review of Systems  Constitutional: Positive for malaise/fatigue. Negative for chills, diaphoresis and fever.  HENT: Negative for nosebleeds and sore throat.   Eyes: Negative for double vision.  Respiratory: Negative for cough, hemoptysis, sputum production, shortness of breath and wheezing.   Cardiovascular: Negative for chest pain,  palpitations, orthopnea and leg swelling.  Gastrointestinal: Negative for abdominal pain, blood in stool, diarrhea, heartburn, melena, nausea and vomiting.  Genitourinary: Negative for dysuria, frequency and urgency.  Musculoskeletal: Positive for back pain. Negative for joint pain.  Skin: Negative.  Negative for itching and rash.  Neurological: Negative for tingling, focal weakness, weakness and headaches.  Endo/Heme/Allergies: Does not bruise/bleed easily.  Psychiatric/Behavioral: Negative for depression. The patient is not nervous/anxious and does not have insomnia.      MEDICAL HISTORY:  Past Medical History:  Diagnosis Date  . Anemia   . Cancer ([place])   . Chronic kidney disease   . Depression   . Hypertension   . Neuromuscular disorder ([place])    Nerve damage to left face/eye since around 2002.    SURGICAL HISTORY: Past Surgical History:  Procedure Laterality Date  . CYSTOSCOPY W/ RETROGRADES Bilateral 01/25/2018   Procedure: CYSTOSCOPY WITH RETROGRADE PYELOGRAM;  [name], MD;  [name];  Service: Urology;  Laterality: Bilateral;  . CYSTOSCOPY WITH STENT PLACEMENT Bilateral 01/25/2018   Procedure: CYSTOSCOPY WITH STENT PLACEMENT;  [name], MD;  [name];  Service: Urology;  Laterality: Bilateral;  . EYE SURGERY     Cornea transplants bilaterally & cataract surgery.  . TRANSURETHRAL RESECTION OF BLADDER TUMOR N/A 01/25/2018   Procedure: TRANSURETHRAL RESECTION OF BLADDER TUMOR (TURBT);  [name], MD;  [name];  Service: Urology;  [name];    SOCIAL HISTORY: lives in [place]; with wife; quit smoking 18 years ago; beer every 2 months or so.mechanic/retd.   Social History   Socioeconomic History  . Marital  status: Married    Spouse name: Not on file  . Number of children: Not on file  . Years of education: Not on file  . Highest education level: Not on file  Occupational History  .  Not on file  Social Needs  . Financial resource strain: Not hard at all  . Food insecurity:    Worry: Never true    Inability: Never true  . Transportation needs:    Medical: Not on file    Non-medical: Not on file  Tobacco Use  . Smoking status: Former Research scientist (life sciences)  . Smokeless tobacco: Current User    Types: Chew  . Tobacco comment: Stopped approximately 10 years ago.  Substance and Sexual Activity  . Alcohol use: Yes    Alcohol/week: 2.0 standard drinks    Types: 2 Cans of beer per week    Comment: Daily  . Drug use: Never  . Sexual activity: Not on file  Lifestyle  . Physical activity:    Days per week: Not on file    Minutes per session: Not on file  . Stress: Only a little  Relationships  . Social connections:    Talks on phone: Not on file    Gets together: Not on file    Attends religious service: Not on file    Active member of club or organization: Not on file    Attends meetings of clubs or organizations: Not on file    Relationship status: Not on file  . Intimate partner violence:    Fear of current or ex partner: Not on file    Emotionally abused: Not on file    Physically abused: Not on file    Forced sexual activity: Not on file  Other Topics Concern  . Not on file  Social History Narrative  . Not on file    FAMILY HISTORY: Family History  Problem Relation Age of Onset  . Prostate cancer Neg Hx   . Kidney cancer Neg Hx   . Bladder Cancer Neg Hx     ALLERGIES:  has No Known Allergies.  MEDICATIONS:  Current Outpatient Medications  Medication Sig Dispense Refill  . amLODipine (NORVASC) 5 MG tablet Take 1 tablet (5 mg total) by mouth daily. 30 tablet 3  . aspirin EC 81 MG tablet Take 81 mg by mouth daily.     . diphenhydrAMINE (BENADRYL) 25 MG tablet Take 25 mg by mouth daily as needed (ALLERGIES).    . Multiple Vitamin (MULTIVITAMIN WITH MINERALS) TABS tablet Take 1 tablet by mouth daily. One-A-Day    . oxybutynin (DITROPAN) 5 MG tablet 1 tab tid  prn frequency,urgency, bladder spasm (Patient taking differently: Take 5 mg by mouth 3 (three) times daily. ) 15 tablet 0  . prednisoLONE acetate (PRED FORTE) 1 % ophthalmic suspension Place 1 drop into both eyes daily.    . tamsulosin (FLOMAX) 0.4 MG CAPS capsule Take 0.4 mg by mouth at bedtime.     . docusate sodium (COLACE) 100 MG capsule Take 2 capsules (200 mg total) by mouth 2 (two) times daily as needed for mild constipation. (Patient not taking: Reported on 06/13/2018) 10 capsule 0  . HYDROcodone-acetaminophen (NORCO/VICODIN) 5-325 MG tablet Take 1 tablet by mouth every 8 (eight) hours as needed for moderate pain. (Patient not taking: Reported on 06/13/2018) 30 tablet 0   No current facility-administered medications for this visit.       Marland Kitchen  PHYSICAL EXAMINATION: ECOG PERFORMANCE STATUS: 1 - Symptomatic but completely  ambulatory  Vitals:   07/25/18 0854  BP: 137/61  Pulse: (!) 53  Resp: 20  Temp: 97.6 F (36.4 C)   Filed Weights   07/25/18 0854  Weight: 123 lb (55.8 kg)    Physical Exam  Constitutional: He is oriented to person, place, and time.  Accompanied by son and wife.  Walking himself.  Thin built moderately nourished male patient.  Appears pale.  HENT:  Head: Normocephalic and atraumatic.  Mouth/Throat: Oropharynx is clear and moist. No oropharyngeal exudate.  Eyes: Pupils are equal, round, and reactive to light.  Chronic drooping of the left eyelid.  Neck: Normal range of motion. Neck supple.  Cardiovascular: Normal rate and regular rhythm.  Pulmonary/Chest: No respiratory distress. He has no wheezes.  Abdominal: Soft. Bowel sounds are normal. He exhibits no distension and no mass. There is no abdominal tenderness. There is no rebound and no guarding.  Musculoskeletal: Normal range of motion.        General: No tenderness or edema.  Neurological: He is alert and oriented to person, place, and time.  Skin: Skin is warm.  Psychiatric: Affect normal.      LABORATORY DATA:  I have reviewed the data as listed Lab Results  Component Value Date   WBC 6.5 07/25/2018   HGB 10.3 (L) 07/25/2018   HCT 31.1 (L) 07/25/2018   MCV 95.4 07/25/2018   PLT 220 07/25/2018   Recent Labs    06/13/18 0813 07/04/18 0812 07/25/18 0813  NA 141 142 141  K 3.7 4.0 4.3  CL 110 112* 112*  CO2 '25 26 25  ' GLUCOSE 119* 110* 114*  BUN 25* 32* 25*  CREATININE 1.72* 1.77* 1.82*  CALCIUM 9.4 9.3 9.0  GFRNONAA 36* 35* 34*  GFRAA 42* 40* 39*  PROT 6.7 6.8 6.9  ALBUMIN 3.7 3.8 3.9  AST '24 28 28  ' ALT '25 25 26  ' ALKPHOS 73 81 85  BILITOT 0.6 0.7 0.6    RADIOGRAPHIC STUDIES: I have personally reviewed the radiological images as listed and agreed with the findings in the report. Ct Abdomen Pelvis Wo Contrast  Result Date: 07/23/2018 CLINICAL DATA:  Follow-up bladder cancer EXAM: CT ABDOMEN AND PELVIS WITHOUT CONTRAST TECHNIQUE: Multidetector CT imaging of the abdomen and pelvis was performed following the standard protocol without IV contrast. COMPARISON:  04/24/2018 FINDINGS: Motion degraded images. Lower chest: Lung bases are clear. Hepatobiliary: Unenhanced liver is grossly unremarkable. Gallbladder is decompressed. No intrahepatic or extrahepatic ductal dilatation. Pancreas: Within normal limits. Spleen: Within normal limits. Adrenals/Urinary Tract: Adrenal glands are within normal limits. Two lower pole renal cysts, measuring up to 1.8 cm (series 2/image 25), poorly evaluated on unenhanced CT. 12 mm hyperdense./hemorrhagic lesion in the lateral left upper kidney. Double pigtail ureteral stents in the proximal ureters and terminating in the bladder. No hydronephrosis. Thick-walled bladder, although underdistended. No bladder mass is evident on unenhanced CT. Stomach/Bowel: Stomach is within normal limits. No evidence of bowel obstruction. Normal appendix (series 2/image 45). Mild to moderate left colonic stool burden. Vascular/Lymphatic: No evidence of abdominal  aortic aneurysm. Atherosclerotic calcifications of the abdominal aorta and branch vessels. No suspicious abdominopelvic lymphadenopathy. Reproductive: Mild prostatomegaly. Other: No abdominopelvic ascites. Musculoskeletal: Degenerative changes of the lumbar spine. IMPRESSION: Thick-walled bladder.  No bladder mass is evident on unenhanced CT. Indwelling double pigtail ureteral stents.  No hydronephrosis. No findings specific for metastatic disease. Additional ancillary findings as above. Electronically Signed   By: Julian Hy M.D.   On: 07/23/2018 13:02  ASSESSMENT & PLAN:   Cancer of overlapping sites of bladder ([place]) # High-grade transitional cell carcinoma of the bladder metastatic to retroperitoneal lymph node.  Stage IV;  FEB 11th CT- NED in RP LN; thickening of bladder  # Proceed with Tecentriq #8 today. Labs today reviewed;  acceptable for treatment today.  # Anemia sec to CKD/ on Iv iron.hb- 10 stable.   # Urinary Obstruction/CKD- improved on Continue flomax 0.4. stable.   # HTN- improved/.continue current medications.   # DISPOSITION: # Tecentriq today # Follow-up in 3 weeks labs-cbc/cmp/Thyroid profile- MD Tecentriq IV-Dr.B   All questions were answered. The patient knows to call the clinic with any problems, questions or concerns.    Cammie Sickle, MD 07/25/2018 9:19 AM

## 2018-07-25 NOTE — Assessment & Plan Note (Signed)
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  Stage IV;  FEB 11th CT- NED in RP LN; thickening of bladder  # Proceed with Tecentriq #8 today. Labs today reviewed;  acceptable for treatment today.  # Anemia sec to CKD/ on Iv iron.hb- 10 stable.   # Urinary Obstruction/CKD- improved on Continue flomax 0.4. stable.   # HTN- improved/.continue current medications.   # DISPOSITION: # Tecentriq today # Follow-up in 3 weeks labs-cbc/cmp/Thyroid profile- MD Tecentriq IV-Dr.B

## 2018-08-15 ENCOUNTER — Inpatient Hospital Stay (HOSPITAL_BASED_OUTPATIENT_CLINIC_OR_DEPARTMENT_OTHER): Payer: Medicare HMO | Admitting: Internal Medicine

## 2018-08-15 ENCOUNTER — Other Ambulatory Visit: Payer: Self-pay

## 2018-08-15 ENCOUNTER — Inpatient Hospital Stay: Payer: Medicare HMO | Attending: Internal Medicine

## 2018-08-15 ENCOUNTER — Inpatient Hospital Stay: Payer: Medicare HMO

## 2018-08-15 ENCOUNTER — Encounter: Payer: Self-pay | Admitting: Internal Medicine

## 2018-08-15 VITALS — BP 138/66 | HR 52 | Temp 97.0°F | Resp 20 | Ht 68.0 in | Wt 127.0 lb

## 2018-08-15 DIAGNOSIS — N139 Obstructive and reflux uropathy, unspecified: Secondary | ICD-10-CM | POA: Diagnosis not present

## 2018-08-15 DIAGNOSIS — C678 Malignant neoplasm of overlapping sites of bladder: Secondary | ICD-10-CM | POA: Insufficient documentation

## 2018-08-15 DIAGNOSIS — D631 Anemia in chronic kidney disease: Secondary | ICD-10-CM | POA: Insufficient documentation

## 2018-08-15 DIAGNOSIS — M545 Low back pain: Secondary | ICD-10-CM

## 2018-08-15 DIAGNOSIS — Z87891 Personal history of nicotine dependence: Secondary | ICD-10-CM | POA: Diagnosis not present

## 2018-08-15 DIAGNOSIS — Z5112 Encounter for antineoplastic immunotherapy: Secondary | ICD-10-CM | POA: Diagnosis not present

## 2018-08-15 DIAGNOSIS — R5383 Other fatigue: Secondary | ICD-10-CM | POA: Diagnosis not present

## 2018-08-15 DIAGNOSIS — Z79899 Other long term (current) drug therapy: Secondary | ICD-10-CM | POA: Insufficient documentation

## 2018-08-15 DIAGNOSIS — I129 Hypertensive chronic kidney disease with stage 1 through stage 4 chronic kidney disease, or unspecified chronic kidney disease: Secondary | ICD-10-CM | POA: Diagnosis not present

## 2018-08-15 DIAGNOSIS — Z7189 Other specified counseling: Secondary | ICD-10-CM

## 2018-08-15 DIAGNOSIS — N183 Chronic kidney disease, stage 3 (moderate): Secondary | ICD-10-CM

## 2018-08-15 LAB — COMPREHENSIVE METABOLIC PANEL
ALT: 20 U/L (ref 0–44)
AST: 29 U/L (ref 15–41)
Albumin: 4.2 g/dL (ref 3.5–5.0)
Alkaline Phosphatase: 79 U/L (ref 38–126)
Anion gap: 5 (ref 5–15)
BUN: 36 mg/dL — ABNORMAL HIGH (ref 8–23)
CO2: 24 mmol/L (ref 22–32)
Calcium: 9.3 mg/dL (ref 8.9–10.3)
Chloride: 109 mmol/L (ref 98–111)
Creatinine, Ser: 2.39 mg/dL — ABNORMAL HIGH (ref 0.61–1.24)
GFR calc Af Amer: 28 mL/min — ABNORMAL LOW (ref >60)
GFR calc non Af Amer: 24 mL/min — ABNORMAL LOW (ref >60)
Glucose, Bld: 124 mg/dL — ABNORMAL HIGH (ref 70–99)
Potassium: 5.4 mmol/L — ABNORMAL HIGH (ref 3.5–5.1)
Sodium: 138 mmol/L (ref 135–145)
Total Bilirubin: 0.8 mg/dL (ref 0.3–1.2)
Total Protein: 7.3 g/dL (ref 6.5–8.1)

## 2018-08-15 LAB — CBC WITH DIFFERENTIAL/PLATELET
Abs Immature Granulocytes: 0.02 10*3/uL (ref 0.00–0.07)
Basophils Absolute: 0.1 10*3/uL (ref 0.0–0.1)
Basophils Relative: 1 %
Eosinophils Absolute: 0.4 10*3/uL (ref 0.0–0.5)
Eosinophils Relative: 7 %
HCT: 31.5 % — ABNORMAL LOW (ref 39.0–52.0)
Hemoglobin: 10.6 g/dL — ABNORMAL LOW (ref 13.0–17.0)
Immature Granulocytes: 0 %
Lymphocytes Relative: 30 %
Lymphs Abs: 1.6 10*3/uL (ref 0.7–4.0)
MCH: 32.5 pg (ref 26.0–34.0)
MCHC: 33.7 g/dL (ref 30.0–36.0)
MCV: 96.6 fL (ref 80.0–100.0)
Monocytes Absolute: 0.4 10*3/uL (ref 0.1–1.0)
Monocytes Relative: 7 %
NEUTROS ABS: 2.9 10*3/uL (ref 1.7–7.7)
Neutrophils Relative %: 55 %
Platelets: 225 10*3/uL (ref 150–400)
RBC: 3.26 MIL/uL — ABNORMAL LOW (ref 4.22–5.81)
RDW: 13.1 % (ref 11.5–15.5)
WBC: 5.4 10*3/uL (ref 4.0–10.5)
nRBC: 0 % (ref 0.0–0.2)

## 2018-08-15 MED ORDER — SODIUM CHLORIDE 0.9 % IV SOLN
Freq: Once | INTRAVENOUS | Status: AC
  Administered 2018-08-15: 11:00:00 via INTRAVENOUS
  Filled 2018-08-15: qty 250

## 2018-08-15 MED ORDER — SODIUM CHLORIDE 0.9 % IV SOLN
1200.0000 mg | Freq: Once | INTRAVENOUS | Status: AC
  Administered 2018-08-15: 1200 mg via INTRAVENOUS
  Filled 2018-08-15: qty 20

## 2018-08-15 MED ORDER — HYDROCODONE-ACETAMINOPHEN 5-325 MG PO TABS: 1.0000 | ORAL_TABLET | Freq: Three times a day (TID) | ORAL | 0 refills | Status: DC | PRN

## 2018-08-15 NOTE — Patient Instructions (Signed)
#   NO MOTRIN/ALEEVE/IBUPROFEN  # Okay with tylenol.

## 2018-08-15 NOTE — Assessment & Plan Note (Addendum)
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  Stage IV;  FEB 11th CT- NED in RP LN; thickening of bladder. STABLE.   # Proceed with Tecentriq #9 today. Labs today reviewed;  acceptable for treatment today.  #Ongoing fatigue-overall stable.  If worse would recommend further work-up.  Discussed regarding care program declines.  # Anemia sec to CKD/ on Iv iron.hb- 10; STABLE.   # Urinary Obstruction/CKD--creatnine-2.3; STABLE. Continue flomax 0.4.   # HTN- improved/.continue current medications.   # DISPOSITION: # Tecentriq today # Follow-up in 3 weeks labs-cbc/cmp/Thyroid profile- MD Tecentriq IV-Dr.B

## 2018-08-15 NOTE — Addendum Note (Signed)
Addended by: Sandria Bales B on: 08/15/2018 11:33 AM   Modules accepted: Orders

## 2018-08-15 NOTE — Progress Notes (Signed)
[facility] CONSULT NOTE  Patient Care Team: [name], MD as PCP - General (Internal Medicine)  CHIEF COMPLAINTS/PURPOSE OF CONSULTATION:  Bladder cancer  #  Oncology History   # AUG 2019-TRANSITIONAL CELL BLADDER CA [~ 4cm tumor] s/p cystoscopy [Dr.Stoiff]  with extensive angiolymphatic invasion; [name]; [name]; mediastinal uptake; right pubic rami uptake.  # 19th ep 2019- Tecentrir  # CKD stage III-IV [creat 2.5]  # Molecular testing- PDL-1 CPS- 20%; NO other targets**  DIAGNOSIS: Bladder ca  STAGE:   IV  ;GOALS: palliative  CURRENT/MOST RECENT THERAPY:Tecentriq        Cancer of overlapping sites of bladder ([place])   02/28/2018 -  Chemotherapy    The patient had atezolizumab (TECENTRIQ) 1,[name], 1,200 mg, Intravenous, Once, 8 of 11 cycles Administration: 1,200 mg (02/28/2018), 1,200 mg (03/21/2018), 1,200 mg (04/11/2018), 1,200 mg (05/02/2018), 1,200 mg (06/13/2018), 1,200 mg (05/23/2018), 1,200 mg (07/04/2018), 1,200 mg (07/25/2018)  for chemotherapy treatment.       HISTORY OF PRESENTING ILLNESS: Edward Cummings 85 y.o.  male with metastatic transitional carcinoma of the bladder currently on Tecentriq is here for follow-up.   Patient denies any shortness of breath or cough.  Complains of fatigue.  Denies any blood in urine.  He is urinating adequately.  He has been taking hydrocodone as needed for back pain.  Review of Systems  Constitutional: Positive for malaise/fatigue. Negative for chills, diaphoresis and fever.  HENT: Negative for nosebleeds and sore throat.   Eyes: Negative for double vision.  Respiratory: Negative for cough, hemoptysis, sputum production, shortness of breath and wheezing.   Cardiovascular: Negative for chest pain, palpitations, orthopnea and leg swelling.   Gastrointestinal: Negative for abdominal pain, blood in stool, diarrhea, heartburn, melena, nausea and vomiting.  Genitourinary: Negative for dysuria, frequency and urgency.  Musculoskeletal: Positive for back pain. Negative for joint pain.  Skin: Negative.  Negative for itching and rash.  Neurological: Negative for tingling, focal weakness, weakness and headaches.  Endo/Heme/Allergies: Does not bruise/bleed easily.  Psychiatric/Behavioral: Negative for depression. The patient is not nervous/anxious and does not have insomnia.      MEDICAL HISTORY:  Past Medical History:  Diagnosis Date  . Anemia   . Cancer ([place])   . Chronic kidney disease   . Depression   . Hypertension   . Neuromuscular disorder ([place])    Nerve damage to left face/eye since around 2002.    SURGICAL HISTORY: Past Surgical History:  Procedure Laterality Date  . CYSTOSCOPY W/ RETROGRADES Bilateral 01/25/2018   Procedure: CYSTOSCOPY WITH RETROGRADE PYELOGRAM;  [name], MD;  [name];  Service: Urology;  Laterality: Bilateral;  . CYSTOSCOPY WITH STENT PLACEMENT Bilateral 01/25/2018   Procedure: CYSTOSCOPY WITH STENT PLACEMENT;  [name], MD;  [name];  Service: Urology;  Laterality: Bilateral;  . EYE SURGERY     Cornea transplants bilaterally & cataract surgery.  . TRANSURETHRAL RESECTION OF BLADDER TUMOR N/A 01/25/2018   Procedure: TRANSURETHRAL RESECTION OF BLADDER TUMOR (TURBT);  [name], MD;  [name];  Service: Urology;  [name];    SOCIAL HISTORY: lives in [place]; with wife; quit smoking 18 years ago; beer every 2 months or so.mechanic/retd.   Social History   Socioeconomic History  . Marital status: Married    Spouse  name: Not on file  . Number of children: Not on file  . Years of education: Not on file  . Highest education level: Not on file  Occupational History  . Not on file  Social Needs  . Financial  resource strain: Not hard at all  . Food insecurity:    Worry: Never true    Inability: Never true  . Transportation needs:    Medical: Not on file    Non-medical: Not on file  Tobacco Use  . Smoking status: Former Research scientist (life sciences)  . Smokeless tobacco: Current User    Types: Chew  . Tobacco comment: Stopped approximately 10 years ago.  Substance and Sexual Activity  . Alcohol use: Yes    Alcohol/week: 2.0 standard drinks    Types: 2 Cans of beer per week    Comment: Daily  . Drug use: Never  . Sexual activity: Not on file  Lifestyle  . Physical activity:    Days per week: Not on file    Minutes per session: Not on file  . Stress: Only a little  Relationships  . Social connections:    Talks on phone: Not on file    Gets together: Not on file    Attends religious service: Not on file    Active member of club or organization: Not on file    Attends meetings of clubs or organizations: Not on file    Relationship status: Not on file  . Intimate partner violence:    Fear of current or ex partner: Not on file    Emotionally abused: Not on file    Physically abused: Not on file    Forced sexual activity: Not on file  Other Topics Concern  . Not on file  Social History Narrative  . Not on file    FAMILY HISTORY: Family History  Problem Relation Age of Onset  . Prostate cancer Neg Hx   . Kidney cancer Neg Hx   . Bladder Cancer Neg Hx     ALLERGIES:  has No Known Allergies.  MEDICATIONS:  Current Outpatient Medications  Medication Sig Dispense Refill  . amLODipine (NORVASC) 5 MG tablet Take 1 tablet (5 mg total) by mouth daily. 30 tablet 3  . aspirin EC 81 MG tablet Take 81 mg by mouth daily.     . diphenhydrAMINE (BENADRYL) 25 MG tablet Take 25 mg by mouth daily as needed (ALLERGIES).    Marland Kitchen HYDROcodone-acetaminophen (NORCO/VICODIN) 5-325 MG tablet Take 1 tablet by mouth every 8 (eight) hours as needed for moderate pain. 60 tablet 0  . Multiple Vitamin (MULTIVITAMIN WITH  MINERALS) TABS tablet Take 1 tablet by mouth daily. One-A-Day    . oxybutynin (DITROPAN) 5 MG tablet 1 tab tid prn frequency,urgency, bladder spasm (Patient taking differently: Take 5 mg by mouth 3 (three) times daily. ) 15 tablet 0  . prednisoLONE acetate (PRED FORTE) 1 % ophthalmic suspension Place 1 drop into both eyes daily.    . tamsulosin (FLOMAX) 0.4 MG CAPS capsule Take 0.4 mg by mouth at bedtime.     . docusate sodium (COLACE) 100 MG capsule Take 2 capsules (200 mg total) by mouth 2 (two) times daily as needed for mild constipation. (Patient not taking: Reported on 06/13/2018) 10 capsule 0   No current facility-administered medications for this visit.       Marland Kitchen  PHYSICAL EXAMINATION: ECOG PERFORMANCE STATUS: 1 - Symptomatic but completely ambulatory  Vitals:   08/15/18 0913  BP: 138/66  Pulse: Marland Kitchen)  52  Resp: 20  Temp: (!) 97 F (36.1 C)   Filed Weights   08/15/18 0913  Weight: 127 lb (57.6 kg)    Physical Exam  Constitutional: He is oriented to person, place, and time.  Accompanied by son and wife.  Walking himself.  Thin built moderately nourished male patient.  Appears pale.  HENT:  Head: Normocephalic and atraumatic.  Mouth/Throat: Oropharynx is clear and moist. No oropharyngeal exudate.  Eyes: Pupils are equal, round, and reactive to light.  Chronic drooping of the left eyelid.  Neck: Normal range of motion. Neck supple.  Cardiovascular: Normal rate and regular rhythm.  Pulmonary/Chest: No respiratory distress. He has no wheezes.  Abdominal: Soft. Bowel sounds are normal. He exhibits no distension and no mass. There is no abdominal tenderness. There is no rebound and no guarding.  Musculoskeletal: Normal range of motion.        General: No tenderness or edema.  Neurological: He is alert and oriented to person, place, and time.  Skin: Skin is warm.  Psychiatric: Affect normal.     LABORATORY DATA:  I have reviewed the data as listed Lab Results  Component  Value Date   WBC 5.4 08/15/2018   HGB 10.6 (L) 08/15/2018   HCT 31.5 (L) 08/15/2018   MCV 96.6 08/15/2018   PLT 225 08/15/2018   Recent Labs    07/04/18 0812 07/25/18 0813 08/15/18 0847  NA 142 141 138  K 4.0 4.3 5.4*  CL 112* 112* 109  CO2 '26 25 24  ' GLUCOSE 110* 114* 124*  BUN 32* 25* 36*  CREATININE 1.77* 1.82* 2.39*  CALCIUM 9.3 9.0 9.3  GFRNONAA 35* 34* 24*  GFRAA 40* 39* 28*  PROT 6.8 6.9 7.3  ALBUMIN 3.8 3.9 4.2  AST '28 28 29  ' ALT '25 26 20  ' ALKPHOS 81 85 79  BILITOT 0.7 0.6 0.8    RADIOGRAPHIC STUDIES: I have personally reviewed the radiological images as listed and agreed with the findings in the report. Ct Abdomen Pelvis Wo Contrast  Result Date: 07/23/2018 CLINICAL DATA:  Follow-up bladder cancer EXAM: CT ABDOMEN AND PELVIS WITHOUT CONTRAST TECHNIQUE: Multidetector CT imaging of the abdomen and pelvis was performed following the standard protocol without IV contrast. COMPARISON:  04/24/2018 FINDINGS: Motion degraded images. Lower chest: Lung bases are clear. Hepatobiliary: Unenhanced liver is grossly unremarkable. Gallbladder is decompressed. No intrahepatic or extrahepatic ductal dilatation. Pancreas: Within normal limits. Spleen: Within normal limits. Adrenals/Urinary Tract: Adrenal glands are within normal limits. Two lower pole renal cysts, measuring up to 1.8 cm (series 2/image 25), poorly evaluated on unenhanced CT. 12 mm hyperdense./hemorrhagic lesion in the lateral left upper kidney. Double pigtail ureteral stents in the proximal ureters and terminating in the bladder. No hydronephrosis. Thick-walled bladder, although underdistended. No bladder mass is evident on unenhanced CT. Stomach/Bowel: Stomach is within normal limits. No evidence of bowel obstruction. Normal appendix (series 2/image 45). Mild to moderate left colonic stool burden. Vascular/Lymphatic: No evidence of abdominal aortic aneurysm. Atherosclerotic calcifications of the abdominal aorta and branch  vessels. No suspicious abdominopelvic lymphadenopathy. Reproductive: Mild prostatomegaly. Other: No abdominopelvic ascites. Musculoskeletal: Degenerative changes of the lumbar spine. IMPRESSION: Thick-walled bladder.  No bladder mass is evident on unenhanced CT. Indwelling double pigtail ureteral stents.  No hydronephrosis. No findings specific for metastatic disease. Additional ancillary findings as above. Electronically Signed   By: Julian Hy M.D.   On: 07/23/2018 13:02    ASSESSMENT & PLAN:   Cancer of overlapping sites of  bladder ([place]) # High-grade transitional cell carcinoma of the bladder metastatic to retroperitoneal lymph node.  Stage IV;  FEB 11th CT- NED in RP LN; thickening of bladder. STABLE.   # Proceed with Tecentriq #9 today. Labs today reviewed;  acceptable for treatment today.  #Ongoing fatigue-overall stable.  If worse would recommend further work-up.  Discussed regarding care program declines.  # Anemia sec to CKD/ on Iv iron.hb- 10; STABLE.   # Urinary Obstruction/CKD--creatnine-2.3; STABLE. Continue flomax 0.4.   # HTN- improved/.continue current medications.   # DISPOSITION: # Tecentriq today # Follow-up in 3 weeks labs-cbc/cmp/Thyroid profile- MD Tecentriq IV-Dr.B   All questions were answered. The patient knows to call the clinic with any problems, questions or concerns.    Cammie Sickle, MD 08/15/2018 10:15 AM

## 2018-08-16 LAB — THYROID PANEL WITH TSH
FREE THYROXINE INDEX: 0.5 — AB (ref 1.2–4.9)
T3 Uptake Ratio: 21 % — ABNORMAL LOW (ref 24–39)
T4, Total: 2.5 ug/dL — ABNORMAL LOW (ref 4.5–12.0)
TSH: 61.59 u[IU]/mL — ABNORMAL HIGH (ref 0.450–4.500)

## 2018-08-20 ENCOUNTER — Telehealth: Payer: Self-pay | Admitting: Internal Medicine

## 2018-08-20 DIAGNOSIS — R5383 Other fatigue: Secondary | ICD-10-CM

## 2018-08-20 MED ORDER — LEVOTHYROXINE SODIUM 88 MCG PO TABS: 88.0000 ug | ORAL_TABLET | Freq: Every day | ORAL | 3 refills | Status: DC

## 2018-08-20 NOTE — Telephone Encounter (Signed)
I left message for patient's wife to return phone call.

## 2018-08-20 NOTE — Telephone Encounter (Signed)
I spoke to pt's Ronalee Belts- that re: low thyroid functioning. Recommend synthroid/sent to pharmacy.   Heather/brooke- please inform pt/wife that he needs to take it on empty stomach.   Also- schedule cortisol/ACTH [I have ordered] in next 1-2 days/ needs to be drawn in AM [prior to 9 AM. ]

## 2018-08-21 NOTE — Telephone Encounter (Signed)
I left 2nd message this AM for pt/pt's wife to return phone call in regards to medication.

## 2018-08-21 NOTE — Telephone Encounter (Signed)
I was able to contact patient's wife and advised her of Dr. Sharmaine Base recommendations. She verbalized understanding.

## 2018-08-22 ENCOUNTER — Inpatient Hospital Stay: Payer: Medicare HMO

## 2018-08-22 ENCOUNTER — Other Ambulatory Visit: Payer: Self-pay

## 2018-08-22 DIAGNOSIS — C678 Malignant neoplasm of overlapping sites of bladder: Secondary | ICD-10-CM

## 2018-08-22 DIAGNOSIS — R5383 Other fatigue: Secondary | ICD-10-CM

## 2018-08-22 DIAGNOSIS — Z5112 Encounter for antineoplastic immunotherapy: Secondary | ICD-10-CM | POA: Diagnosis not present

## 2018-08-22 LAB — CBC WITH DIFFERENTIAL/PLATELET
ABS IMMATURE GRANULOCYTES: 0.01 10*3/uL (ref 0.00–0.07)
Basophils Absolute: 0 10*3/uL (ref 0.0–0.1)
Basophils Relative: 1 %
Eosinophils Absolute: 0.5 10*3/uL (ref 0.0–0.5)
Eosinophils Relative: 9 %
HCT: 28.3 % — ABNORMAL LOW (ref 39.0–52.0)
HEMOGLOBIN: 9.7 g/dL — AB (ref 13.0–17.0)
Immature Granulocytes: 0 %
LYMPHS PCT: 29 %
Lymphs Abs: 1.4 10*3/uL (ref 0.7–4.0)
MCH: 32.8 pg (ref 26.0–34.0)
MCHC: 34.3 g/dL (ref 30.0–36.0)
MCV: 95.6 fL (ref 80.0–100.0)
Monocytes Absolute: 0.3 10*3/uL (ref 0.1–1.0)
Monocytes Relative: 6 %
Neutro Abs: 2.7 10*3/uL (ref 1.7–7.7)
Neutrophils Relative %: 55 %
Platelets: 202 10*3/uL (ref 150–400)
RBC: 2.96 MIL/uL — ABNORMAL LOW (ref 4.22–5.81)
RDW: 13.4 % (ref 11.5–15.5)
WBC: 5 10*3/uL (ref 4.0–10.5)
nRBC: 0 % (ref 0.0–0.2)

## 2018-08-22 LAB — COMPREHENSIVE METABOLIC PANEL
ALT: 21 U/L (ref 0–44)
AST: 30 U/L (ref 15–41)
Albumin: 4 g/dL (ref 3.5–5.0)
Alkaline Phosphatase: 70 U/L (ref 38–126)
Anion gap: 4 — ABNORMAL LOW (ref 5–15)
BUN: 30 mg/dL — ABNORMAL HIGH (ref 8–23)
CO2: 25 mmol/L (ref 22–32)
Calcium: 9.1 mg/dL (ref 8.9–10.3)
Chloride: 110 mmol/L (ref 98–111)
Creatinine, Ser: 2.26 mg/dL — ABNORMAL HIGH (ref 0.61–1.24)
GFR calc Af Amer: 30 mL/min — ABNORMAL LOW (ref >60)
GFR calc non Af Amer: 26 mL/min — ABNORMAL LOW (ref >60)
Glucose, Bld: 95 mg/dL (ref 70–99)
Potassium: 4.7 mmol/L (ref 3.5–5.1)
Sodium: 139 mmol/L (ref 135–145)
Total Bilirubin: 0.7 mg/dL (ref 0.3–1.2)
Total Protein: 6.9 g/dL (ref 6.5–8.1)

## 2018-08-22 LAB — CORTISOL: Cortisol, Plasma: 11.3 ug/dL

## 2018-09-04 ENCOUNTER — Other Ambulatory Visit: Payer: Self-pay

## 2018-09-05 ENCOUNTER — Inpatient Hospital Stay (HOSPITAL_BASED_OUTPATIENT_CLINIC_OR_DEPARTMENT_OTHER): Payer: Medicare HMO | Admitting: Internal Medicine

## 2018-09-05 ENCOUNTER — Encounter: Payer: Self-pay | Admitting: Internal Medicine

## 2018-09-05 ENCOUNTER — Other Ambulatory Visit: Payer: Self-pay

## 2018-09-05 ENCOUNTER — Telehealth: Payer: Self-pay | Admitting: Internal Medicine

## 2018-09-05 ENCOUNTER — Inpatient Hospital Stay: Payer: Medicare HMO

## 2018-09-05 VITALS — BP 164/62 | HR 53 | Temp 97.0°F | Resp 16 | Wt 130.0 lb

## 2018-09-05 DIAGNOSIS — Z5112 Encounter for antineoplastic immunotherapy: Secondary | ICD-10-CM | POA: Diagnosis not present

## 2018-09-05 DIAGNOSIS — D631 Anemia in chronic kidney disease: Secondary | ICD-10-CM

## 2018-09-05 DIAGNOSIS — Z87891 Personal history of nicotine dependence: Secondary | ICD-10-CM | POA: Diagnosis not present

## 2018-09-05 DIAGNOSIS — C678 Malignant neoplasm of overlapping sites of bladder: Secondary | ICD-10-CM | POA: Diagnosis not present

## 2018-09-05 DIAGNOSIS — N183 Chronic kidney disease, stage 3 (moderate): Secondary | ICD-10-CM

## 2018-09-05 DIAGNOSIS — E038 Other specified hypothyroidism: Secondary | ICD-10-CM

## 2018-09-05 DIAGNOSIS — Z7189 Other specified counseling: Secondary | ICD-10-CM

## 2018-09-05 LAB — COMPREHENSIVE METABOLIC PANEL
ALT: 20 U/L (ref 0–44)
AST: 28 U/L (ref 15–41)
Albumin: 4.2 g/dL (ref 3.5–5.0)
Alkaline Phosphatase: 71 U/L (ref 38–126)
Anion gap: 5 (ref 5–15)
BILIRUBIN TOTAL: 0.8 mg/dL (ref 0.3–1.2)
BUN: 25 mg/dL — AB (ref 8–23)
CO2: 24 mmol/L (ref 22–32)
Calcium: 9.2 mg/dL (ref 8.9–10.3)
Chloride: 109 mmol/L (ref 98–111)
Creatinine, Ser: 1.85 mg/dL — ABNORMAL HIGH (ref 0.61–1.24)
GFR calc Af Amer: 38 mL/min — ABNORMAL LOW (ref >60)
GFR, EST NON AFRICAN AMERICAN: 33 mL/min — AB (ref >60)
Glucose, Bld: 99 mg/dL (ref 70–99)
Potassium: 4.2 mmol/L (ref 3.5–5.1)
Sodium: 138 mmol/L (ref 135–145)
Total Protein: 7.3 g/dL (ref 6.5–8.1)

## 2018-09-05 LAB — CBC WITH DIFFERENTIAL/PLATELET
Abs Immature Granulocytes: 0 10*3/uL (ref 0.00–0.07)
Basophils Absolute: 0.1 10*3/uL (ref 0.0–0.1)
Basophils Relative: 1 %
Eosinophils Absolute: 0.4 10*3/uL (ref 0.0–0.5)
Eosinophils Relative: 8 %
HEMATOCRIT: 30 % — AB (ref 39.0–52.0)
Hemoglobin: 10 g/dL — ABNORMAL LOW (ref 13.0–17.0)
Immature Granulocytes: 0 %
Lymphocytes Relative: 41 %
Lymphs Abs: 2.2 10*3/uL (ref 0.7–4.0)
MCH: 32.6 pg (ref 26.0–34.0)
MCHC: 33.3 g/dL (ref 30.0–36.0)
MCV: 97.7 fL (ref 80.0–100.0)
MONOS PCT: 7 %
Monocytes Absolute: 0.4 10*3/uL (ref 0.1–1.0)
Neutro Abs: 2.3 10*3/uL (ref 1.7–7.7)
Neutrophils Relative %: 43 %
Platelets: 192 10*3/uL (ref 150–400)
RBC: 3.07 MIL/uL — ABNORMAL LOW (ref 4.22–5.81)
RDW: 14.1 % (ref 11.5–15.5)
WBC: 5.4 10*3/uL (ref 4.0–10.5)
nRBC: 0 % (ref 0.0–0.2)

## 2018-09-05 MED ORDER — SODIUM CHLORIDE 0.9 % IV SOLN
Freq: Once | INTRAVENOUS | Status: AC
  Administered 2018-09-05: 11:00:00 via INTRAVENOUS
  Filled 2018-09-05: qty 250

## 2018-09-05 MED ORDER — SODIUM CHLORIDE 0.9 % IV SOLN
1200.0000 mg | Freq: Once | INTRAVENOUS | Status: AC
  Administered 2018-09-05: 1200 mg via INTRAVENOUS
  Filled 2018-09-05: qty 20

## 2018-09-05 MED ORDER — TAMSULOSIN HCL 0.4 MG PO CAPS: 0.4000 mg | ORAL_CAPSULE | Freq: Every day | ORAL | 1 refills | Status: DC

## 2018-09-05 NOTE — Assessment & Plan Note (Addendum)
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  Stage IV;  FEB 11th CT- NED in RP LN; thickening of bladder.STABLE.   # Proceed with Tecentriq. . Labs today reviewed;  acceptable for treatment today.  Given the significant improvement on recent CT scan in February/in view of coronavirus pandemic-I think it is reasonable to hold off the next treatment.  # Iatrogenic hypothyroidism-secondary Keytruda.  Started on Synthroid 88 mcg tolerating well.  Slightly improved.  # Anemia sec to CKD/ on Iv iron.hb- 10;stable.   #CKD stage III continue flomax 0.4.  New prescription in.  #I tried to reach patient's son Mike-unable to leave a message/ to the discuss above.  # DISPOSITION: # Tecentriq today # in 4 weeks- labs thyroid profile; Tele health next day- # Follow-up in 7 weeks-MD labs-cbc/cmp. Tecentriq IV-Dr.B

## 2018-09-05 NOTE — Telephone Encounter (Signed)
I tried calling pt son.

## 2018-09-05 NOTE — Progress Notes (Signed)
[facility] CONSULT NOTE  Patient Care Team: [name], MD as PCP - General (Internal Medicine)  CHIEF COMPLAINTS/PURPOSE OF CONSULTATION:  Bladder cancer  #  Oncology History   # AUG 2019-TRANSITIONAL CELL BLADDER CA [~ 4cm tumor] s/p cystoscopy [Dr.Stoiff]  with extensive angiolymphatic invasion; [name]; [name]; mediastinal uptake; right pubic rami uptake.  # 19th ep 2019- Tecentriq   # Match 2020- HYPOTHYROIDISM [sec to Tecen]  # CKD stage III-IV [creat 2.5]  # Molecular testing- PDL-1 CPS- 20%; NO other targets**  DIAGNOSIS: Bladder ca  STAGE:   IV  ;GOALS: palliative  CURRENT/MOST RECENT THERAPY:Tecentriq        Cancer of overlapping sites of bladder ([place])   02/28/2018 -  Chemotherapy    The patient had atezolizumab (TECENTRIQ) 1,[name], 1,200 mg, Intravenous, Once, 10 of 11 cycles Administration: 1,200 mg (02/28/2018), 1,200 mg (03/21/2018), 1,200 mg (04/11/2018), 1,200 mg (05/02/2018), 1,200 mg (06/13/2018), 1,200 mg (05/23/2018), 1,200 mg (07/04/2018), 1,200 mg (07/25/2018), 1,200 mg (08/15/2018), 1,200 mg (09/05/2018)  for chemotherapy treatment.       HISTORY OF PRESENTING ILLNESS: Edward Cummings 85 y.o.  male with metastatic transitional carcinoma of the bladder currently on Tecentriq is here for follow-up.   Patient was diagnosed with hypothyroidism at last visit.  Patient was started on Synthroid.  He states his fatigue is slightly improving.  Denies any worsening shortness of breath or cough.  Appetite is fair.  Review of Systems  Constitutional: Positive for malaise/fatigue. Negative for chills, diaphoresis and fever.  HENT: Negative for nosebleeds and sore throat.   Eyes: Negative for double vision.  Respiratory: Negative for cough, hemoptysis, sputum production, shortness  of breath and wheezing.   Cardiovascular: Negative for chest pain, palpitations, orthopnea and leg swelling.  Gastrointestinal: Negative for abdominal pain, blood in stool, diarrhea, heartburn, melena, nausea and vomiting.  Genitourinary: Negative for dysuria, frequency and urgency.  Musculoskeletal: Positive for back pain. Negative for joint pain.  Skin: Negative.  Negative for itching and rash.  Neurological: Negative for tingling, focal weakness, weakness and headaches.  Endo/Heme/Allergies: Does not bruise/bleed easily.  Psychiatric/Behavioral: Negative for depression. The patient is not nervous/anxious and does not have insomnia.      MEDICAL HISTORY:  Past Medical History:  Diagnosis Date  . Anemia   . Cancer ([place])   . Chronic kidney disease   . Depression   . Hypertension   . Neuromuscular disorder ([place])    Nerve damage to left face/eye since around 2002.    SURGICAL HISTORY: Past Surgical History:  Procedure Laterality Date  . CYSTOSCOPY W/ RETROGRADES Bilateral 01/25/2018   Procedure: CYSTOSCOPY WITH RETROGRADE PYELOGRAM;  [name], MD;  [name];  Service: Urology;  Laterality: Bilateral;  . CYSTOSCOPY WITH STENT PLACEMENT Bilateral 01/25/2018   Procedure: CYSTOSCOPY WITH STENT PLACEMENT;  [name], MD;  [name];  Service: Urology;  Laterality: Bilateral;  . EYE SURGERY     Cornea transplants bilaterally & cataract surgery.  . TRANSURETHRAL RESECTION OF BLADDER TUMOR N/A 01/25/2018   Procedure: TRANSURETHRAL RESECTION OF BLADDER TUMOR (TURBT);  [name], MD;  [name];  Service: Urology;  [name];    SOCIAL HISTORY: lives in [place]; with wife; quit smoking 18 years ago; beer every 2 months or so.mechanic/retd.  Social History   Socioeconomic History  . Marital status: Married    Spouse name: Not on file  . Number of children: Not on file  . Years of education: Not on file   . Highest education level: Not on file  Occupational History  . Not on file  Social Needs  . Financial resource strain: Not hard at all  . Food insecurity:    Worry: Never true    Inability: Never true  . Transportation needs:    Medical: Not on file    Non-medical: Not on file  Tobacco Use  . Smoking status: Former Research scientist (life sciences)  . Smokeless tobacco: Current User    Types: Chew  . Tobacco comment: Stopped approximately 10 years ago.  Substance and Sexual Activity  . Alcohol use: Yes    Alcohol/week: 2.0 standard drinks    Types: 2 Cans of beer per week    Comment: Daily  . Drug use: Never  . Sexual activity: Not on file  Lifestyle  . Physical activity:    Days per week: Not on file    Minutes per session: Not on file  . Stress: Only a little  Relationships  . Social connections:    Talks on phone: Not on file    Gets together: Not on file    Attends religious service: Not on file    Active member of club or organization: Not on file    Attends meetings of clubs or organizations: Not on file    Relationship status: Not on file  . Intimate partner violence:    Fear of current or ex partner: Not on file    Emotionally abused: Not on file    Physically abused: Not on file    Forced sexual activity: Not on file  Other Topics Concern  . Not on file  Social History Narrative  . Not on file    FAMILY HISTORY: Family History  Problem Relation Age of Onset  . Prostate cancer Neg Hx   . Kidney cancer Neg Hx   . Bladder Cancer Neg Hx     ALLERGIES:  has No Known Allergies.  MEDICATIONS:  Current Outpatient Medications  Medication Sig Dispense Refill  . amLODipine (NORVASC) 5 MG tablet Take 1 tablet (5 mg total) by mouth daily. 30 tablet 3  . aspirin EC 81 MG tablet Take 81 mg by mouth daily.     . diphenhydrAMINE (BENADRYL) 25 MG tablet Take 25 mg by mouth daily as needed (ALLERGIES).    Marland Kitchen HYDROcodone-acetaminophen (NORCO/VICODIN) 5-325 MG tablet Take 1 tablet by mouth  every 8 (eight) hours as needed for moderate pain. 60 tablet 0  . levothyroxine (SYNTHROID) 88 MCG tablet Take 1 tablet (88 mcg total) by mouth daily before breakfast. Empty stomach- 1 hour prior to breakfast. 30 tablet 3  . Multiple Vitamin (MULTIVITAMIN WITH MINERALS) TABS tablet Take 1 tablet by mouth daily. One-A-Day    . oxybutynin (DITROPAN) 5 MG tablet 1 tab tid prn frequency,urgency, bladder spasm (Patient taking differently: Take 5 mg by mouth 3 (three) times daily. ) 15 tablet 0  . prednisoLONE acetate (PRED FORTE) 1 % ophthalmic suspension Place 1 drop into both eyes daily.    Marland Kitchen docusate sodium (COLACE) 100 MG capsule Take 2 capsules (200 mg total) by mouth 2 (two) times daily as needed for mild constipation. (Patient not taking: Reported on 06/13/2018) 10 capsule 0  . tamsulosin (FLOMAX) 0.4 MG CAPS capsule Take 1 capsule (0.4 mg total)  by mouth at bedtime. 90 capsule 1   No current facility-administered medications for this visit.       Marland Kitchen  PHYSICAL EXAMINATION: ECOG PERFORMANCE STATUS: 1 - Symptomatic but completely ambulatory  Vitals:   09/05/18 0955  BP: (!) 164/62  Pulse: (!) 53  Resp: 16  Temp: (!) 97 F (36.1 C)   Filed Weights   09/05/18 0955  Weight: 130 lb (59 kg)    Physical Exam  Constitutional: He is oriented to person, place, and time.  Accompanied by son and wife.  Walking himself.  Thin built moderately nourished male patient.  Appears pale.  HENT:  Head: Normocephalic and atraumatic.  Mouth/Throat: Oropharynx is clear and moist. No oropharyngeal exudate.  Eyes: Pupils are equal, round, and reactive to light.  Chronic drooping of the left eyelid.  Neck: Normal range of motion. Neck supple.  Cardiovascular: Normal rate and regular rhythm.  Pulmonary/Chest: No respiratory distress. He has no wheezes.  Abdominal: Soft. Bowel sounds are normal. He exhibits no distension and no mass. There is no abdominal tenderness. There is no rebound and no guarding.   Musculoskeletal: Normal range of motion.        General: No tenderness or edema.  Neurological: He is alert and oriented to person, place, and time.  Skin: Skin is warm.  Psychiatric: Affect normal.     LABORATORY DATA:  I have reviewed the data as listed Lab Results  Component Value Date   WBC 5.4 09/05/2018   HGB 10.0 (L) 09/05/2018   HCT 30.0 (L) 09/05/2018   MCV 97.7 09/05/2018   PLT 192 09/05/2018   Recent Labs    08/15/18 0847 08/22/18 0826 09/05/18 0911  NA 138 139 138  K 5.4* 4.7 4.2  CL 109 110 109  CO2 '24 25 24  ' GLUCOSE 124* 95 99  BUN 36* 30* 25*  CREATININE 2.39* 2.26* 1.85*  CALCIUM 9.3 9.1 9.2  GFRNONAA 24* 26* 33*  GFRAA 28* 30* 38*  PROT 7.3 6.9 7.3  ALBUMIN 4.2 4.0 4.2  AST '29 30 28  ' ALT '20 21 20  ' ALKPHOS 79 70 71  BILITOT 0.8 0.7 0.8    RADIOGRAPHIC STUDIES: I have personally reviewed the radiological images as listed and agreed with the findings in the report. No results found.  ASSESSMENT & PLAN:   Cancer of overlapping sites of bladder ([place]) # High-grade transitional cell carcinoma of the bladder metastatic to retroperitoneal lymph node.  Stage IV;  FEB 11th CT- NED in RP LN; thickening of bladder.STABLE.   # Proceed with Tecentriq. . Labs today reviewed;  acceptable for treatment today.  Given the significant improvement on recent CT scan in February/in view of coronavirus pandemic-I think it is reasonable to hold off the next treatment.  # Iatrogenic hypothyroidism-secondary Keytruda.  Started on Synthroid 88 mcg tolerating well.  Slightly improved.  # Anemia sec to CKD/ on Iv iron.hb- 10;stable.   #CKD stage III continue flomax 0.4.  New prescription in.  #I tried to reach patient's son Mike-unable to leave a message/ to the discuss above.  # DISPOSITION: # Tecentriq today # in 4 weeks- labs thyroid profile; Tele health next day- # Follow-up in 7 weeks-MD labs-cbc/cmp. Tecentriq IV-Dr.B  All questions were answered. The  patient knows to call the clinic with any problems, questions or concerns.    Cammie Sickle, MD 09/06/2018 8:42 AM

## 2018-09-06 LAB — THYROID PANEL WITH TSH
Free Thyroxine Index: 1.7 (ref 1.2–4.9)
T3 UPTAKE RATIO: 24 % (ref 24–39)
T4, Total: 7.2 ug/dL (ref 4.5–12.0)
TSH: 21.92 u[IU]/mL — ABNORMAL HIGH (ref 0.450–4.500)

## 2018-09-17 DIAGNOSIS — I10 Essential (primary) hypertension: Secondary | ICD-10-CM | POA: Diagnosis not present

## 2018-09-17 DIAGNOSIS — R12 Heartburn: Secondary | ICD-10-CM | POA: Diagnosis not present

## 2018-09-17 DIAGNOSIS — R6251 Failure to thrive (child): Secondary | ICD-10-CM | POA: Diagnosis not present

## 2018-09-17 DIAGNOSIS — H53002 Unspecified amblyopia, left eye: Secondary | ICD-10-CM | POA: Diagnosis not present

## 2018-10-03 ENCOUNTER — Other Ambulatory Visit: Payer: Self-pay

## 2018-10-03 ENCOUNTER — Inpatient Hospital Stay: Payer: Medicare HMO | Attending: Internal Medicine | Admitting: *Deleted

## 2018-10-03 VITALS — BP 171/66 | HR 54 | Temp 96.5°F | Resp 20

## 2018-10-03 DIAGNOSIS — D631 Anemia in chronic kidney disease: Secondary | ICD-10-CM | POA: Diagnosis not present

## 2018-10-03 DIAGNOSIS — R5383 Other fatigue: Secondary | ICD-10-CM

## 2018-10-03 DIAGNOSIS — Z95828 Presence of other vascular implants and grafts: Secondary | ICD-10-CM

## 2018-10-03 DIAGNOSIS — N183 Chronic kidney disease, stage 3 (moderate): Secondary | ICD-10-CM | POA: Insufficient documentation

## 2018-10-03 DIAGNOSIS — C678 Malignant neoplasm of overlapping sites of bladder: Secondary | ICD-10-CM | POA: Diagnosis not present

## 2018-10-03 DIAGNOSIS — E038 Other specified hypothyroidism: Secondary | ICD-10-CM | POA: Insufficient documentation

## 2018-10-03 LAB — CBC WITH DIFFERENTIAL/PLATELET
Abs Immature Granulocytes: 0.01 10*3/uL (ref 0.00–0.07)
Basophils Absolute: 0 10*3/uL (ref 0.0–0.1)
Basophils Relative: 1 %
Eosinophils Absolute: 0.5 10*3/uL (ref 0.0–0.5)
Eosinophils Relative: 8 %
HCT: 29.3 % — ABNORMAL LOW (ref 39.0–52.0)
Hemoglobin: 9.8 g/dL — ABNORMAL LOW (ref 13.0–17.0)
Immature Granulocytes: 0 %
Lymphocytes Relative: 32 %
Lymphs Abs: 1.8 10*3/uL (ref 0.7–4.0)
MCH: 32.9 pg (ref 26.0–34.0)
MCHC: 33.4 g/dL (ref 30.0–36.0)
MCV: 98.3 fL (ref 80.0–100.0)
Monocytes Absolute: 0.4 10*3/uL (ref 0.1–1.0)
Monocytes Relative: 8 %
Neutro Abs: 2.9 10*3/uL (ref 1.7–7.7)
Neutrophils Relative %: 51 %
Platelets: 197 10*3/uL (ref 150–400)
RBC: 2.98 MIL/uL — ABNORMAL LOW (ref 4.22–5.81)
RDW: 13.2 % (ref 11.5–15.5)
WBC: 5.6 10*3/uL (ref 4.0–10.5)
nRBC: 0 % (ref 0.0–0.2)

## 2018-10-03 LAB — COMPREHENSIVE METABOLIC PANEL
ALT: 17 U/L (ref 0–44)
AST: 25 U/L (ref 15–41)
Albumin: 4.2 g/dL (ref 3.5–5.0)
Alkaline Phosphatase: 72 U/L (ref 38–126)
Anion gap: 5 (ref 5–15)
BUN: 35 mg/dL — ABNORMAL HIGH (ref 8–23)
CO2: 24 mmol/L (ref 22–32)
Calcium: 9.3 mg/dL (ref 8.9–10.3)
Chloride: 110 mmol/L (ref 98–111)
Creatinine, Ser: 2.35 mg/dL — ABNORMAL HIGH (ref 0.61–1.24)
GFR calc Af Amer: 28 mL/min — ABNORMAL LOW (ref >60)
GFR calc non Af Amer: 24 mL/min — ABNORMAL LOW (ref >60)
Glucose, Bld: 93 mg/dL (ref 70–99)
Potassium: 4.8 mmol/L (ref 3.5–5.1)
Sodium: 139 mmol/L (ref 135–145)
Total Bilirubin: 0.8 mg/dL (ref 0.3–1.2)
Total Protein: 7.2 g/dL (ref 6.5–8.1)

## 2018-10-03 LAB — TSH: TSH: 2.562 u[IU]/mL (ref 0.350–4.500)

## 2018-10-04 ENCOUNTER — Other Ambulatory Visit: Payer: Self-pay | Admitting: *Deleted

## 2018-10-04 ENCOUNTER — Other Ambulatory Visit: Payer: Self-pay

## 2018-10-04 ENCOUNTER — Inpatient Hospital Stay (HOSPITAL_BASED_OUTPATIENT_CLINIC_OR_DEPARTMENT_OTHER): Payer: Medicare HMO | Admitting: Internal Medicine

## 2018-10-04 ENCOUNTER — Encounter: Payer: Self-pay | Admitting: Internal Medicine

## 2018-10-04 DIAGNOSIS — N183 Chronic kidney disease, stage 3 unspecified: Secondary | ICD-10-CM

## 2018-10-04 DIAGNOSIS — C678 Malignant neoplasm of overlapping sites of bladder: Secondary | ICD-10-CM

## 2018-10-04 DIAGNOSIS — D631 Anemia in chronic kidney disease: Secondary | ICD-10-CM | POA: Diagnosis not present

## 2018-10-04 DIAGNOSIS — C772 Secondary and unspecified malignant neoplasm of intra-abdominal lymph nodes: Secondary | ICD-10-CM | POA: Diagnosis not present

## 2018-10-04 DIAGNOSIS — E032 Hypothyroidism due to medicaments and other exogenous substances: Secondary | ICD-10-CM

## 2018-10-04 NOTE — Progress Notes (Signed)
I connected with [name] on 10/04/18 at  1:45 PM EDT by telephone visit and verified that I am speaking with the correct person using two identifiers.  I discussed the limitations, risks, security and privacy concerns of performing an evaluation and management service by telemedicine and the availability of in-person appointments. I also discussed with the patient that there may be a patient responsible charge related to this service. The patient expressed understanding and agreed to proceed.    Other persons participating in the visit and their role in the encounter: wife Patient's location: home  Provider's location: home   Oncology History   # AUG 2019-TRANSITIONAL CELL BLADDER CA [~ 4cm tumor] s/p cystoscopy [Dr.Stoiff]  with extensive angiolymphatic invasion; [name]; [name]; mediastinal uptake; right pubic rami uptake.  # 19th ep 2019- Tecentriq   # Match 2020- HYPOTHYROIDISM [sec to Tecen]  # CKD stage III-IV [creat 2.5]  # Molecular testing- PDL-1 CPS- 20%; NO other targets**  DIAGNOSIS: Bladder ca  STAGE:   IV  ;GOALS: palliative  CURRENT/MOST RECENT THERAPY:Tecentriq        Cancer of overlapping sites of bladder ([place])   02/28/2018 -  Chemotherapy    The patient had atezolizumab (TECENTRIQ) 1,[name], 1,200 mg, Intravenous, Once, 10 of 11 cycles Administration: 1,200 mg (02/28/2018), 1,200 mg (03/21/2018), 1,200 mg (04/11/2018), 1,200 mg (05/02/2018), 1,200 mg (06/13/2018), 1,200 mg (05/23/2018), 1,200 mg (07/04/2018), 1,200 mg (07/25/2018), 1,200 mg (08/15/2018), 1,200 mg (09/05/2018)  for chemotherapy treatment.       Chief Complaint: bladder cancer     History of present illness:Edward Cummings 85 y.o.  male with history of current on Tecentriq.  His treatment approximately 4 weeks ago was held  because of hypothyroidism.  And also because of coronavirus pandemic.  His appetite is improving.  Denies any unusual cough or shortness of breath or chest pain.  Observation/objective: Hemoglobin 9.8.+  Assessment and plan: Cancer of overlapping sites of bladder ([place]) # High-grade transitional cell carcinoma of the bladder metastatic to retroperitoneal lymph node.  Stage IV;  FEB 11th CT- NED in RP LN; thickening of bladder. Stable.    # clinically stable.  Currently Tecentriq on hold because of corona pandemic.  # Iatrogenic hypothyroidism-secondary Keytruda.  Started on Synthroid 88 mcg tolerating well.  Improved TSH normal.  # Anemia sec to CKD/ on Iv iron.hb-9.9 stable.  #CKD stage III continue flomax 0.4.  Stable  # DISPOSITION: # Follow-up in 3 weeks-MD labs-cbc/cmp/thyroid profile. Tecentriq IV-Dr.B    Follow-up instructions:  I discussed the assessment and treatment plan with the patient.  The patient was provided an opportunity to ask questions and all were answered.  The patient agreed with the plan and demonstrated understanding of instructions.  The patient was advised to call back or seek an in person evaluation if the symptoms worsen or if the condition fails to improve as anticipated.  I provided 12  minutes of non face-to-face telephone visit time during this encounter, and > 50% was spent counseling as documented under my assessment & plan.   Dr. Charlaine Dalton [place] at [facility] 10/04/2018 2:11 PM

## 2018-10-04 NOTE — Assessment & Plan Note (Signed)
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  Stage IV;  FEB 11th CT- NED in RP LN; thickening of bladder. Stable.    # clinically stable.  Currently Tecentriq on hold because of corona pandemic.  # Iatrogenic hypothyroidism-secondary Keytruda.  Started on Synthroid 88 mcg tolerating well.  Improved TSH normal.  # Anemia sec to CKD/ on Iv iron.hb-9.9 stable.  #CKD stage III continue flomax 0.4.  Stable  # DISPOSITION: # Follow-up in 3 weeks-MD labs-cbc/cmp/thyroid profile. Tecentriq IV-Dr.B

## 2018-10-08 DIAGNOSIS — R6251 Failure to thrive (child): Secondary | ICD-10-CM | POA: Diagnosis not present

## 2018-10-08 DIAGNOSIS — R0602 Shortness of breath: Secondary | ICD-10-CM | POA: Diagnosis not present

## 2018-10-08 DIAGNOSIS — I119 Hypertensive heart disease without heart failure: Secondary | ICD-10-CM | POA: Diagnosis not present

## 2018-10-08 DIAGNOSIS — D509 Iron deficiency anemia, unspecified: Secondary | ICD-10-CM | POA: Diagnosis not present

## 2018-10-17 ENCOUNTER — Ambulatory Visit: Payer: Medicare HMO | Admitting: Internal Medicine

## 2018-10-17 ENCOUNTER — Other Ambulatory Visit: Payer: Medicare HMO

## 2018-10-17 ENCOUNTER — Ambulatory Visit: Payer: Medicare HMO

## 2018-10-25 ENCOUNTER — Inpatient Hospital Stay: Payer: Medicare HMO

## 2018-10-25 ENCOUNTER — Other Ambulatory Visit: Payer: Self-pay

## 2018-10-25 ENCOUNTER — Other Ambulatory Visit: Payer: Self-pay | Admitting: *Deleted

## 2018-10-25 ENCOUNTER — Inpatient Hospital Stay: Payer: Medicare HMO | Attending: Internal Medicine

## 2018-10-25 ENCOUNTER — Inpatient Hospital Stay (HOSPITAL_BASED_OUTPATIENT_CLINIC_OR_DEPARTMENT_OTHER): Payer: Medicare HMO | Admitting: Internal Medicine

## 2018-10-25 ENCOUNTER — Encounter: Payer: Self-pay | Admitting: Internal Medicine

## 2018-10-25 ENCOUNTER — Encounter (INDEPENDENT_AMBULATORY_CARE_PROVIDER_SITE_OTHER): Payer: Self-pay

## 2018-10-25 VITALS — BP 136/72 | HR 60 | Temp 96.8°F | Resp 18 | Ht 68.0 in | Wt 126.8 lb

## 2018-10-25 DIAGNOSIS — Z5112 Encounter for antineoplastic immunotherapy: Secondary | ICD-10-CM | POA: Insufficient documentation

## 2018-10-25 DIAGNOSIS — C678 Malignant neoplasm of overlapping sites of bladder: Secondary | ICD-10-CM

## 2018-10-25 DIAGNOSIS — D631 Anemia in chronic kidney disease: Secondary | ICD-10-CM | POA: Diagnosis not present

## 2018-10-25 DIAGNOSIS — Z7189 Other specified counseling: Secondary | ICD-10-CM

## 2018-10-25 DIAGNOSIS — Z87891 Personal history of nicotine dependence: Secondary | ICD-10-CM

## 2018-10-25 DIAGNOSIS — E039 Hypothyroidism, unspecified: Secondary | ICD-10-CM | POA: Diagnosis not present

## 2018-10-25 DIAGNOSIS — N183 Chronic kidney disease, stage 3 (moderate): Secondary | ICD-10-CM

## 2018-10-25 DIAGNOSIS — D509 Iron deficiency anemia, unspecified: Secondary | ICD-10-CM

## 2018-10-25 LAB — CBC WITH DIFFERENTIAL/PLATELET
Abs Immature Granulocytes: 0.01 10*3/uL (ref 0.00–0.07)
Basophils Absolute: 0 10*3/uL (ref 0.0–0.1)
Basophils Relative: 1 %
Eosinophils Absolute: 0.6 10*3/uL — ABNORMAL HIGH (ref 0.0–0.5)
Eosinophils Relative: 11 %
HCT: 26.6 % — ABNORMAL LOW (ref 39.0–52.0)
Hemoglobin: 9 g/dL — ABNORMAL LOW (ref 13.0–17.0)
Immature Granulocytes: 0 %
Lymphocytes Relative: 25 %
Lymphs Abs: 1.4 10*3/uL (ref 0.7–4.0)
MCH: 33.5 pg (ref 26.0–34.0)
MCHC: 33.8 g/dL (ref 30.0–36.0)
MCV: 98.9 fL (ref 80.0–100.0)
Monocytes Absolute: 0.5 10*3/uL (ref 0.1–1.0)
Monocytes Relative: 8 %
Neutro Abs: 3 10*3/uL (ref 1.7–7.7)
Neutrophils Relative %: 55 %
Platelets: 188 10*3/uL (ref 150–400)
RBC: 2.69 MIL/uL — ABNORMAL LOW (ref 4.22–5.81)
RDW: 13 % (ref 11.5–15.5)
WBC: 5.5 10*3/uL (ref 4.0–10.5)
nRBC: 0 % (ref 0.0–0.2)

## 2018-10-25 LAB — FERRITIN: Ferritin: 359 ng/mL — ABNORMAL HIGH (ref 24–336)

## 2018-10-25 LAB — COMPREHENSIVE METABOLIC PANEL
ALT: 15 U/L (ref 0–44)
AST: 23 U/L (ref 15–41)
Albumin: 3.8 g/dL (ref 3.5–5.0)
Alkaline Phosphatase: 69 U/L (ref 38–126)
Anion gap: 4 — ABNORMAL LOW (ref 5–15)
BUN: 32 mg/dL — ABNORMAL HIGH (ref 8–23)
CO2: 25 mmol/L (ref 22–32)
Calcium: 8.9 mg/dL (ref 8.9–10.3)
Chloride: 110 mmol/L (ref 98–111)
Creatinine, Ser: 2.19 mg/dL — ABNORMAL HIGH (ref 0.61–1.24)
GFR calc Af Amer: 31 mL/min — ABNORMAL LOW (ref >60)
GFR calc non Af Amer: 27 mL/min — ABNORMAL LOW (ref >60)
Glucose, Bld: 126 mg/dL — ABNORMAL HIGH (ref 70–99)
Potassium: 4 mmol/L (ref 3.5–5.1)
Sodium: 139 mmol/L (ref 135–145)
Total Bilirubin: 0.6 mg/dL (ref 0.3–1.2)
Total Protein: 6.7 g/dL (ref 6.5–8.1)

## 2018-10-25 LAB — IRON AND TIBC
Iron: 81 ug/dL (ref 45–182)
Saturation Ratios: 36 % (ref 17.9–39.5)
TIBC: 226 ug/dL — ABNORMAL LOW (ref 250–450)
UIBC: 145 ug/dL

## 2018-10-25 MED ORDER — SODIUM CHLORIDE 0.9 % IV SOLN
1200.0000 mg | Freq: Once | INTRAVENOUS | Status: AC
  Administered 2018-10-25: 1200 mg via INTRAVENOUS
  Filled 2018-10-25: qty 20

## 2018-10-25 MED ORDER — HYDROCODONE-ACETAMINOPHEN 5-325 MG PO TABS: 1.0000 | ORAL_TABLET | Freq: Three times a day (TID) | ORAL | 0 refills | Status: DC | PRN

## 2018-10-25 MED ORDER — SODIUM CHLORIDE 0.9 % IV SOLN
Freq: Once | INTRAVENOUS | Status: AC
  Administered 2018-10-25: 10:00:00 via INTRAVENOUS
  Filled 2018-10-25: qty 250

## 2018-10-25 NOTE — Assessment & Plan Note (Addendum)
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  Stage IV;  FEB 11th CT- NED in RP LN; thickening of bladder.stable.   # clinically stable.  CBC CMP are reviewed; adequate today; no contraindications to chemotherapy. Proceed with treatment today.   # Iatrogenic hypothyroidism-secondary Keytruda.  Started on Synthroid 88 mcg tolerating well.  Improved TSH normal.  # Anemia sec to CKD/ on Iv iron.hb-9.0; stable. Marland Kitchen  #CKD stage III-STABLE.  continue flomax 0.4.   # I updated patient's son Ronalee Belts over the phone.   # DISPOSITION: add iron studies/ferritin today.  # treatment today. # Follow-up in 4 weeks-MD labs-cbc/cmp-Tecentriq IV-; CT prior-Dr.B

## 2018-10-25 NOTE — Progress Notes (Signed)
[facility] CONSULT NOTE  Patient Care Team: [name], MD as PCP - General (Internal Medicine)  CHIEF COMPLAINTS/PURPOSE OF CONSULTATION:  Bladder cancer  #  Oncology History   # AUG 2019-TRANSITIONAL CELL BLADDER CA [~ 4cm tumor] s/p cystoscopy [Dr.Stoiff]  with extensive angiolymphatic invasion; [name]; [name]; mediastinal uptake; right pubic rami uptake.  # 19th ep 2019- Tecentriq   # Match 2020- HYPOTHYROIDISM [sec to Tecen]  # CKD stage III-IV [creat 2.5]  # Molecular testing- PDL-1 CPS- 20%; NO other targets**  DIAGNOSIS: Bladder ca  STAGE:   IV  ;GOALS: palliative  CURRENT/MOST RECENT THERAPY:Tecentriq        Cancer of overlapping sites of bladder ([place])   02/28/2018 -  Chemotherapy    The patient had atezolizumab (TECENTRIQ) 1,[name], 1,200 mg, Intravenous, Once, 11 of 14 cycles Administration: 1,200 mg (02/28/2018), 1,200 mg (03/21/2018), 1,200 mg (04/11/2018), 1,200 mg (05/02/2018), 1,200 mg (06/13/2018), 1,200 mg (05/23/2018), 1,200 mg (07/04/2018), 1,200 mg (07/25/2018), 1,200 mg (08/15/2018), 1,200 mg (09/05/2018), 1,200 mg (10/25/2018)  for chemotherapy treatment.       HISTORY OF PRESENTING ILLNESS: Edward Cummings 85 y.o.  male with metastatic transitional carcinoma of the bladder currently on Tecentriq is here for follow-up.   Patient denies any nausea vomiting.  Appetite is good.  No blood in stools black or stools.  Mild fatigue.  No chest pain.  Review of Systems  Constitutional: Positive for malaise/fatigue. Negative for chills, diaphoresis and fever.  HENT: Negative for nosebleeds and sore throat.   Eyes: Negative for double vision.  Respiratory: Negative for cough, hemoptysis, sputum production, shortness of breath and wheezing.   Cardiovascular: Negative for chest  pain, palpitations, orthopnea and leg swelling.  Gastrointestinal: Negative for abdominal pain, blood in stool, diarrhea, heartburn, melena, nausea and vomiting.  Genitourinary: Negative for dysuria, frequency and urgency.  Musculoskeletal: Positive for back pain. Negative for joint pain.  Skin: Negative.  Negative for itching and rash.  Neurological: Negative for tingling, focal weakness, weakness and headaches.  Endo/Heme/Allergies: Does not bruise/bleed easily.  Psychiatric/Behavioral: Negative for depression. The patient is not nervous/anxious and does not have insomnia.      MEDICAL HISTORY:  Past Medical History:  Diagnosis Date  . Anemia   . Cancer ([place])   . Chronic kidney disease   . Depression   . Hypertension   . Neuromuscular disorder ([place])    Nerve damage to left face/eye since around 2002.    SURGICAL HISTORY: Past Surgical History:  Procedure Laterality Date  . CYSTOSCOPY W/ RETROGRADES Bilateral 01/25/2018   Procedure: CYSTOSCOPY WITH RETROGRADE PYELOGRAM;  [name], MD;  [name];  Service: Urology;  Laterality: Bilateral;  . CYSTOSCOPY WITH STENT PLACEMENT Bilateral 01/25/2018   Procedure: CYSTOSCOPY WITH STENT PLACEMENT;  [name], MD;  [name];  Service: Urology;  Laterality: Bilateral;  . EYE SURGERY     Cornea transplants bilaterally & cataract surgery.  . TRANSURETHRAL RESECTION OF BLADDER TUMOR N/A 01/25/2018   Procedure: TRANSURETHRAL RESECTION OF BLADDER TUMOR (TURBT);  [name], MD;  [name];  Service: Urology;  [name];    SOCIAL HISTORY: lives in [place]; with wife; quit smoking 18 years ago; beer every 2 months or so.mechanic/retd.   Social History   Socioeconomic History  .  Marital status: Married    Spouse name: Not on file  . Number of children: Not on file  . Years of education: Not on file  . Highest education level: Not on file  Occupational  History  . Not on file  Social Needs  . Financial resource strain: Not hard at all  . Food insecurity:    Worry: Never true    Inability: Never true  . Transportation needs:    Medical: Not on file    Non-medical: Not on file  Tobacco Use  . Smoking status: Former Research scientist (life sciences)  . Smokeless tobacco: Current User    Types: Chew  . Tobacco comment: Stopped approximately 10 years ago.  Substance and Sexual Activity  . Alcohol use: Yes    Alcohol/week: 2.0 standard drinks    Types: 2 Cans of beer per week    Comment: Daily  . Drug use: Never  . Sexual activity: Not on file  Lifestyle  . Physical activity:    Days per week: Not on file    Minutes per session: Not on file  . Stress: Only a little  Relationships  . Social connections:    Talks on phone: Not on file    Gets together: Not on file    Attends religious service: Not on file    Active member of club or organization: Not on file    Attends meetings of clubs or organizations: Not on file    Relationship status: Not on file  . Intimate partner violence:    Fear of current or ex partner: Not on file    Emotionally abused: Not on file    Physically abused: Not on file    Forced sexual activity: Not on file  Other Topics Concern  . Not on file  Social History Narrative  . Not on file    FAMILY HISTORY: Family History  Problem Relation Age of Onset  . Prostate cancer Neg Hx   . Kidney cancer Neg Hx   . Bladder Cancer Neg Hx     ALLERGIES:  has No Known Allergies.  MEDICATIONS:  Current Outpatient Medications  Medication Sig Dispense Refill  . amLODipine (NORVASC) 5 MG tablet Take 1 tablet (5 mg total) by mouth daily. 30 tablet 3  . aspirin EC 81 MG tablet Take 81 mg by mouth daily.     . diphenhydrAMINE (BENADRYL) 25 MG tablet Take 25 mg by mouth daily as needed (ALLERGIES).    Marland Kitchen docusate sodium (COLACE) 100 MG capsule Take 2 capsules (200 mg total) by mouth 2 (two) times daily as needed for mild constipation. 10  capsule 0  . levothyroxine (SYNTHROID) 88 MCG tablet Take 1 tablet (88 mcg total) by mouth daily before breakfast. Empty stomach- 1 hour prior to breakfast. 30 tablet 3  . Multiple Vitamin (MULTIVITAMIN WITH MINERALS) TABS tablet Take 1 tablet by mouth daily. One-A-Day    . oxybutynin (DITROPAN) 5 MG tablet 1 tab tid prn frequency,urgency, bladder spasm (Patient taking differently: Take 5 mg by mouth 3 (three) times daily. ) 15 tablet 0  . prednisoLONE acetate (PRED FORTE) 1 % ophthalmic suspension Place 1 drop into both eyes daily.    . tamsulosin (FLOMAX) 0.4 MG CAPS capsule Take 1 capsule (0.4 mg total) by mouth at bedtime. 90 capsule 1  . HYDROcodone-acetaminophen (NORCO/VICODIN) 5-325 MG tablet Take 1 tablet by mouth every 8 (eight) hours as needed for moderate pain. 60 tablet 0   No current facility-administered medications for  this visit.       Marland Kitchen  PHYSICAL EXAMINATION: ECOG PERFORMANCE STATUS: 1 - Symptomatic but completely ambulatory  Vitals:   10/25/18 0915  BP: 136/72  Pulse: 60  Resp: 18  Temp: (!) 96.8 F (36 C)   Filed Weights   10/25/18 0915  Weight: 126 lb 12.8 oz (57.5 kg)    Physical Exam  Constitutional: He is oriented to person, place, and time.  Alone.   Walking himself.  Thin built moderately nourished male patient.  HENT:  Head: Normocephalic and atraumatic.  Mouth/Throat: Oropharynx is clear and moist. No oropharyngeal exudate.  Eyes: Pupils are equal, round, and reactive to light.  Chronic drooping of the left eyelid.  Neck: Normal range of motion. Neck supple.  Cardiovascular: Normal rate and regular rhythm.  Pulmonary/Chest: No respiratory distress. He has no wheezes.  Abdominal: Soft. Bowel sounds are normal. He exhibits no distension and no mass. There is no abdominal tenderness. There is no rebound and no guarding.  Musculoskeletal: Normal range of motion.        General: No tenderness or edema.  Neurological: He is alert and oriented to  person, place, and time.  Skin: Skin is warm.  Psychiatric: Affect normal.     LABORATORY DATA:  I have reviewed the data as listed Lab Results  Component Value Date   WBC 5.5 10/25/2018   HGB 9.0 (L) 10/25/2018   HCT 26.6 (L) 10/25/2018   MCV 98.9 10/25/2018   PLT 188 10/25/2018   Recent Labs    09/05/18 0911 10/03/18 1030 10/25/18 0904  NA 138 139 139  K 4.2 4.8 4.0  CL 109 110 110  CO2 _0 GLUCOSE 99 93 126*  BUN 25* 35* 32*  CREATININE 1.85* 2.35* 2.19*  CALCIUM 9.2 9.3 8.9  GFRNONAA 33* 24* 27*  GFRAA 38* 28* 31*  PROT 7.3 7.2 6.7  ALBUMIN 4.2 4.2 3.8  AST _1 ALT _2 ALKPHOS 71 72 69  BILITOT 0.8 0.8 0.6    RADIOGRAPHIC STUDIES: I have personally reviewed the radiological images as listed and agreed with the findings in the report. No results found.  ASSESSMENT & PLAN:   Cancer of overlapping sites of bladder ([place]) # High-grade transitional cell carcinoma of the bladder metastatic to retroperitoneal lymph node.  Stage IV;  FEB 11th CT- NED in RP LN; thickening of bladder.stable.   # clinically stable.  CBC CMP are reviewed; adequate today; no contraindications to chemotherapy. Proceed with treatment today.   # Iatrogenic hypothyroidism-secondary Keytruda.  Started on Synthroid 88 mcg tolerating well.  Improved TSH normal.  # Anemia sec to CKD/ on Iv iron.hb-9.0; stable. Marland Kitchen  #CKD stage III-STABLE.  continue flomax 0.4.   # I updated patient's son Ronalee Belts over the phone.   # DISPOSITION: add iron studies/ferritin today.  # treatment today. # Follow-up in 4 weeks-MD labs-cbc/cmp-Tecentriq IV-; CT prior-Dr.B  All questions were answered. The patient knows to call the clinic with any problems, questions or concerns.    Cammie Sickle, MD 10/29/2018 1:03 PM

## 2018-10-26 LAB — ACTH: C206 ACTH: 30.1 pg/mL (ref 7.2–63.3)

## 2018-11-20 ENCOUNTER — Ambulatory Visit: Admission: RE | Admit: 2018-11-20 | Payer: Medicare HMO | Source: Ambulatory Visit

## 2018-11-21 ENCOUNTER — Telehealth: Payer: Self-pay | Admitting: Internal Medicine

## 2018-11-21 ENCOUNTER — Other Ambulatory Visit: Payer: Self-pay

## 2018-11-21 DIAGNOSIS — R0602 Shortness of breath: Secondary | ICD-10-CM | POA: Diagnosis not present

## 2018-11-21 DIAGNOSIS — R6251 Failure to thrive (child): Secondary | ICD-10-CM | POA: Diagnosis not present

## 2018-11-21 DIAGNOSIS — D509 Iron deficiency anemia, unspecified: Secondary | ICD-10-CM | POA: Diagnosis not present

## 2018-11-21 DIAGNOSIS — C679 Malignant neoplasm of bladder, unspecified: Secondary | ICD-10-CM | POA: Diagnosis not present

## 2018-11-21 NOTE — Telephone Encounter (Signed)
Spoke to pt and completed travel screen. Also explained about addl screening questions they will be asked, new guidelines about mask req, no visitors, and fever checks °

## 2018-11-22 ENCOUNTER — Inpatient Hospital Stay (HOSPITAL_BASED_OUTPATIENT_CLINIC_OR_DEPARTMENT_OTHER): Payer: Medicare HMO | Admitting: Internal Medicine

## 2018-11-22 ENCOUNTER — Other Ambulatory Visit: Payer: Self-pay

## 2018-11-22 ENCOUNTER — Inpatient Hospital Stay: Payer: Medicare HMO | Attending: Internal Medicine

## 2018-11-22 ENCOUNTER — Inpatient Hospital Stay: Payer: Medicare HMO

## 2018-11-22 DIAGNOSIS — C678 Malignant neoplasm of overlapping sites of bladder: Secondary | ICD-10-CM

## 2018-11-22 DIAGNOSIS — Z5112 Encounter for antineoplastic immunotherapy: Secondary | ICD-10-CM | POA: Insufficient documentation

## 2018-11-22 DIAGNOSIS — G8929 Other chronic pain: Secondary | ICD-10-CM

## 2018-11-22 DIAGNOSIS — N183 Chronic kidney disease, stage 3 (moderate): Secondary | ICD-10-CM | POA: Diagnosis not present

## 2018-11-22 DIAGNOSIS — E032 Hypothyroidism due to medicaments and other exogenous substances: Secondary | ICD-10-CM

## 2018-11-22 DIAGNOSIS — D631 Anemia in chronic kidney disease: Secondary | ICD-10-CM | POA: Diagnosis not present

## 2018-11-22 DIAGNOSIS — R5382 Chronic fatigue, unspecified: Secondary | ICD-10-CM | POA: Diagnosis not present

## 2018-11-22 DIAGNOSIS — Z7189 Other specified counseling: Secondary | ICD-10-CM

## 2018-11-22 DIAGNOSIS — M545 Low back pain: Secondary | ICD-10-CM

## 2018-11-22 DIAGNOSIS — Z87891 Personal history of nicotine dependence: Secondary | ICD-10-CM

## 2018-11-22 LAB — CBC WITH DIFFERENTIAL/PLATELET
Abs Immature Granulocytes: 0.01 10*3/uL (ref 0.00–0.07)
Basophils Absolute: 0 10*3/uL (ref 0.0–0.1)
Basophils Relative: 1 %
Eosinophils Absolute: 0.5 10*3/uL (ref 0.0–0.5)
Eosinophils Relative: 10 %
HCT: 27.6 % — ABNORMAL LOW (ref 39.0–52.0)
Hemoglobin: 9.3 g/dL — ABNORMAL LOW (ref 13.0–17.0)
Immature Granulocytes: 0 %
Lymphocytes Relative: 31 %
Lymphs Abs: 1.6 10*3/uL (ref 0.7–4.0)
MCH: 33.8 pg (ref 26.0–34.0)
MCHC: 33.7 g/dL (ref 30.0–36.0)
MCV: 100.4 fL — ABNORMAL HIGH (ref 80.0–100.0)
Monocytes Absolute: 0.5 10*3/uL (ref 0.1–1.0)
Monocytes Relative: 9 %
Neutro Abs: 2.6 10*3/uL (ref 1.7–7.7)
Neutrophils Relative %: 49 %
Platelets: 190 10*3/uL (ref 150–400)
RBC: 2.75 MIL/uL — ABNORMAL LOW (ref 4.22–5.81)
RDW: 12.9 % (ref 11.5–15.5)
WBC: 5.3 10*3/uL (ref 4.0–10.5)
nRBC: 0 % (ref 0.0–0.2)

## 2018-11-22 LAB — COMPREHENSIVE METABOLIC PANEL
ALT: 16 U/L (ref 0–44)
AST: 20 U/L (ref 15–41)
Albumin: 4 g/dL (ref 3.5–5.0)
Alkaline Phosphatase: 68 U/L (ref 38–126)
Anion gap: 6 (ref 5–15)
BUN: 27 mg/dL — ABNORMAL HIGH (ref 8–23)
CO2: 22 mmol/L (ref 22–32)
Calcium: 8.9 mg/dL (ref 8.9–10.3)
Chloride: 111 mmol/L (ref 98–111)
Creatinine, Ser: 1.95 mg/dL — ABNORMAL HIGH (ref 0.61–1.24)
GFR calc Af Amer: 36 mL/min — ABNORMAL LOW (ref >60)
GFR calc non Af Amer: 31 mL/min — ABNORMAL LOW (ref >60)
Glucose, Bld: 125 mg/dL — ABNORMAL HIGH (ref 70–99)
Potassium: 4.2 mmol/L (ref 3.5–5.1)
Sodium: 139 mmol/L (ref 135–145)
Total Bilirubin: 0.7 mg/dL (ref 0.3–1.2)
Total Protein: 6.7 g/dL (ref 6.5–8.1)

## 2018-11-22 MED ORDER — SODIUM CHLORIDE 0.9 % IV SOLN
1200.0000 mg | Freq: Once | INTRAVENOUS | Status: AC
  Administered 2018-11-22: 1200 mg via INTRAVENOUS
  Filled 2018-11-22: qty 20

## 2018-11-22 MED ORDER — SODIUM CHLORIDE 0.9 % IV SOLN
Freq: Once | INTRAVENOUS | Status: AC
  Administered 2018-11-22: 10:00:00 via INTRAVENOUS
  Filled 2018-11-22: qty 250

## 2018-11-22 NOTE — Assessment & Plan Note (Addendum)
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  Stage IV;  FEB 11th CT- NED in RP LN; thickening of bladder. Stable.   # on [employer] today reviewed;  acceptable for treatment today. Await CT scans on 6/15.   # Iatrogenic hypothyroidism-secondary Keytruda.  Started on Synthroid 88 mcg tolerating well. Stable.   # Anemia sec to CKD/ on Iv iron.hb-9.3; Iron sat- stable.   #CKD stage III-STABLE.  continue flomax 0.4.   # I updated patient's son Ronalee Belts over the phone.   # DISPOSITION:  # treatment today. # Follow-up in 4 weeks-MD labs-cbc/cmp-Tecentriq IV-Dr.B

## 2018-11-22 NOTE — Progress Notes (Signed)
[facility] CONSULT NOTE  Patient Care Team: [name], MD as PCP - General (Internal Medicine)  CHIEF COMPLAINTS/PURPOSE OF CONSULTATION:  Bladder cancer  #  Oncology History Overview Note  # AUG 2019-TRANSITIONAL CELL BLADDER CA [~ 4cm tumor] s/p cystoscopy [Dr.Stoiff]  with extensive angiolymphatic invasion; [name]; [name]; mediastinal uptake; right pubic rami uptake.  # 19th ep 2019- Tecentriq   # Match 2020- HYPOTHYROIDISM [sec to Tecen]  # CKD stage III-IV [creat 2.5]  # Molecular testing- PDL-1 CPS- 20%; NO other targets**  DIAGNOSIS: Bladder ca  STAGE:   IV  ;GOALS: palliative  CURRENT/MOST RECENT THERAPY:Tecentriq      Cancer of overlapping sites of bladder ([place])  02/28/2018 -  Chemotherapy   The patient had atezolizumab (TECENTRIQ) 1,[name], 1,200 mg, Intravenous, Once, 12 of 14 cycles Administration: 1,200 mg (02/28/2018), 1,200 mg (03/21/2018), 1,200 mg (04/11/2018), 1,200 mg (05/02/2018), 1,200 mg (06/13/2018), 1,200 mg (05/23/2018), 1,200 mg (07/04/2018), 1,200 mg (07/25/2018), 1,200 mg (08/15/2018), 1,200 mg (09/05/2018), 1,200 mg (10/25/2018), 1,200 mg (11/22/2018)  for chemotherapy treatment.       HISTORY OF PRESENTING ILLNESS: Edward Cummings 85 y.o.  male with metastatic transitional carcinoma of the bladder currently on Tecentriq is here for follow-up.   Appetite is good.  No weight loss.  Chronic mild fatigue.  No shortness of breath or cough.  Chronic back pain.  Review of Systems  Constitutional: Positive for malaise/fatigue. Negative for chills, diaphoresis and fever.  HENT: Negative for nosebleeds and sore throat.   Eyes: Negative for double vision.  Respiratory: Negative for cough, hemoptysis, sputum production, shortness of breath and wheezing.   Cardiovascular:  Negative for chest pain, palpitations, orthopnea and leg swelling.  Gastrointestinal: Negative for abdominal pain, blood in stool, diarrhea, heartburn, melena, nausea and vomiting.  Genitourinary: Negative for dysuria, frequency and urgency.  Musculoskeletal: Positive for back pain. Negative for joint pain.  Skin: Negative.  Negative for itching and rash.  Neurological: Negative for tingling, focal weakness, weakness and headaches.  Endo/Heme/Allergies: Does not bruise/bleed easily.  Psychiatric/Behavioral: Negative for depression. The patient is not nervous/anxious and does not have insomnia.      MEDICAL HISTORY:  Past Medical History:  Diagnosis Date  . Anemia   . Cancer ([place])   . Chronic kidney disease   . Depression   . Hypertension   . Neuromuscular disorder ([place])    Nerve damage to left face/eye since around 2002.    SURGICAL HISTORY: Past Surgical History:  Procedure Laterality Date  . CYSTOSCOPY W/ RETROGRADES Bilateral 01/25/2018   Procedure: CYSTOSCOPY WITH RETROGRADE PYELOGRAM;  [name], MD;  [name];  Service: Urology;  Laterality: Bilateral;  . CYSTOSCOPY WITH STENT PLACEMENT Bilateral 01/25/2018   Procedure: CYSTOSCOPY WITH STENT PLACEMENT;  [name], MD;  [name];  Service: Urology;  Laterality: Bilateral;  . EYE SURGERY     Cornea transplants bilaterally & cataract surgery.  . TRANSURETHRAL RESECTION OF BLADDER TUMOR N/A 01/25/2018   Procedure: TRANSURETHRAL RESECTION OF BLADDER TUMOR (TURBT);  [name], MD;  [name];  Service: Urology;  [name];    SOCIAL HISTORY: lives in [place]; with wife; quit smoking 18 years ago; beer every 2 months or so.mechanic/retd.   Social History   Socioeconomic History  . Marital  status: Married    Spouse name: Not on file  . Number of children: Not on file  . Years of education: Not on file  . Highest education level: Not on file   Occupational History  . Not on file  Social Needs  . Financial resource strain: Not hard at all  . Food insecurity    Worry: Never true    Inability: Never true  . Transportation needs    Medical: Not on file    Non-medical: Not on file  Tobacco Use  . Smoking status: Former Research scientist (life sciences)  . Smokeless tobacco: Current User    Types: Chew  . Tobacco comment: Stopped approximately 10 years ago.  Substance and Sexual Activity  . Alcohol use: Yes    Alcohol/week: 2.0 standard drinks    Types: 2 Cans of beer per week    Comment: Daily  . Drug use: Never  . Sexual activity: Not on file  Lifestyle  . Physical activity    Days per week: Not on file    Minutes per session: Not on file  . Stress: Only a little  Relationships  . Social Herbalist on phone: Not on file    Gets together: Not on file    Attends religious service: Not on file    Active member of club or organization: Not on file    Attends meetings of clubs or organizations: Not on file    Relationship status: Not on file  . Intimate partner violence    Fear of current or ex partner: Not on file    Emotionally abused: Not on file    Physically abused: Not on file    Forced sexual activity: Not on file  Other Topics Concern  . Not on file  Social History Narrative  . Not on file    FAMILY HISTORY: Family History  Problem Relation Age of Onset  . Prostate cancer Neg Hx   . Kidney cancer Neg Hx   . Bladder Cancer Neg Hx     ALLERGIES:  has No Known Allergies.  MEDICATIONS:  Current Outpatient Medications  Medication Sig Dispense Refill  . amLODipine (NORVASC) 5 MG tablet Take 1 tablet (5 mg total) by mouth daily. 30 tablet 3  . aspirin EC 81 MG tablet Take 81 mg by mouth daily.     . diphenhydrAMINE (BENADRYL) 25 MG tablet Take 25 mg by mouth daily as needed (ALLERGIES).    Marland Kitchen docusate sodium (COLACE) 100 MG capsule Take 2 capsules (200 mg total) by mouth 2 (two) times daily as needed for mild  constipation. 10 capsule 0  . levothyroxine (SYNTHROID) 88 MCG tablet Take 1 tablet (88 mcg total) by mouth daily before breakfast. Empty stomach- 1 hour prior to breakfast. 30 tablet 3  . Multiple Vitamin (MULTIVITAMIN WITH MINERALS) TABS tablet Take 1 tablet by mouth daily. One-A-Day    . oxybutynin (DITROPAN) 5 MG tablet 1 tab tid prn frequency,urgency, bladder spasm (Patient taking differently: Take 5 mg by mouth 3 (three) times daily. ) 15 tablet 0  . prednisoLONE acetate (PRED FORTE) 1 % ophthalmic suspension Place 1 drop into both eyes daily.    . tamsulosin (FLOMAX) 0.4 MG CAPS capsule Take 1 capsule (0.4 mg total) by mouth at bedtime. 90 capsule 1  . HYDROcodone-acetaminophen (NORCO/VICODIN) 5-325 MG tablet Take 1 tablet by mouth every 8 (eight) hours as needed for moderate pain. (Patient not taking: Reported on 11/22/2018) 60 tablet 0  No current facility-administered medications for this visit.       Marland Kitchen  PHYSICAL EXAMINATION: ECOG PERFORMANCE STATUS: 1 - Symptomatic but completely ambulatory  Vitals:   11/22/18 0927  BP: (!) 151/65  Pulse: (!) 50  Resp: 18  Temp: (!) 97.5 F (36.4 C)   Filed Weights   11/22/18 0927  Weight: 129 lb (58.5 kg)    Physical Exam  Constitutional: He is oriented to person, place, and time.  Alone.   Walking himself.  Thin built moderately nourished male patient.  HENT:  Head: Normocephalic and atraumatic.  Mouth/Throat: Oropharynx is clear and moist. No oropharyngeal exudate.  Eyes: Pupils are equal, round, and reactive to light.  Chronic drooping of the left eyelid.  Neck: Normal range of motion. Neck supple.  Cardiovascular: Normal rate and regular rhythm.  Pulmonary/Chest: No respiratory distress. He has no wheezes.  Abdominal: Soft. Bowel sounds are normal. He exhibits no distension and no mass. There is no abdominal tenderness. There is no rebound and no guarding.  Musculoskeletal: Normal range of motion.        General: No  tenderness or edema.  Neurological: He is alert and oriented to person, place, and time.  Skin: Skin is warm.  Psychiatric: Affect normal.     LABORATORY DATA:  I have reviewed the data as listed Lab Results  Component Value Date   WBC 5.3 11/22/2018   HGB 9.3 (L) 11/22/2018   HCT 27.6 (L) 11/22/2018   MCV 100.4 (H) 11/22/2018   PLT 190 11/22/2018   Recent Labs    10/03/18 1030 10/25/18 0904 11/22/18 0855  NA 139 139 139  K 4.8 4.0 4.2  CL 110 110 111  CO2 '24 25 22  ' GLUCOSE 93 126* 125*  BUN 35* 32* 27*  CREATININE 2.35* 2.19* 1.95*  CALCIUM 9.3 8.9 8.9  GFRNONAA 24* 27* 31*  GFRAA 28* 31* 36*  PROT 7.2 6.7 6.7  ALBUMIN 4.2 3.8 4.0  AST '25 23 20  ' ALT '17 15 16  ' ALKPHOS 72 69 68  BILITOT 0.8 0.6 0.7    RADIOGRAPHIC STUDIES: I have personally reviewed the radiological images as listed and agreed with the findings in the report. No results found.  ASSESSMENT & PLAN:   Cancer of overlapping sites of bladder ([place]) # High-grade transitional cell carcinoma of the bladder metastatic to retroperitoneal lymph node.  Stage IV;  FEB 11th CT- NED in RP LN; thickening of bladder. Stable.   # on [employer] today reviewed;  acceptable for treatment today. Await CT scans on 6/15.   # Iatrogenic hypothyroidism-secondary Keytruda.  Started on Synthroid 88 mcg tolerating well. Stable.   # Anemia sec to CKD/ on Iv iron.hb-9.3; Iron sat- stable.   #CKD stage III-STABLE.  continue flomax 0.4.   # I updated patient's son Ronalee Belts over the phone.   # DISPOSITION:  # treatment today. # Follow-up in 4 weeks-MD labs-cbc/cmp-Tecentriq IV-Dr.B  All questions were answered. The patient knows to call the clinic with any problems, questions or concerns.    Cammie Sickle, MD 12/04/2018 6:53 AM

## 2018-11-25 ENCOUNTER — Ambulatory Visit: Admission: RE | Admit: 2018-11-25 | Payer: Medicare HMO | Source: Ambulatory Visit

## 2018-12-16 ENCOUNTER — Other Ambulatory Visit: Payer: Self-pay

## 2018-12-16 ENCOUNTER — Ambulatory Visit
Admission: RE | Admit: 2018-12-16 | Discharge: 2018-12-16 | Disposition: A | Discharge status: Home / Self Care | Payer: Medicare HMO | Source: Ambulatory Visit | Attending: Internal Medicine | Admitting: Internal Medicine

## 2018-12-16 DIAGNOSIS — C678 Malignant neoplasm of overlapping sites of bladder: Secondary | ICD-10-CM | POA: Insufficient documentation

## 2018-12-16 DIAGNOSIS — C679 Malignant neoplasm of bladder, unspecified: Secondary | ICD-10-CM | POA: Diagnosis not present

## 2018-12-16 DIAGNOSIS — N400 Enlarged prostate without lower urinary tract symptoms: Secondary | ICD-10-CM | POA: Diagnosis not present

## 2018-12-16 DIAGNOSIS — Z5111 Encounter for antineoplastic chemotherapy: Secondary | ICD-10-CM | POA: Diagnosis not present

## 2018-12-16 IMAGING — CT CT ABDOMEN AND PELVIS WITHOUT CONTRAST
2 of 4 series · 13 of 46 positions shown, 15 images · non-contrast
Comparison: [DATE] CT abdomen/pelvis.  [DATE] PET-CT.

CLINICAL DATA: Stage IV transitional cell bladder cancer diagnosed
[DATE] with ongoing chemotherapy. Restaging.

EXAM:
CT CHEST, ABDOMEN AND PELVIS WITHOUT CONTRAST
TECHNIQUE: Multidetector CT imaging of the chest, abdomen and pelvis was
performed following the standard protocol without IV contrast.

[Series 2: axials cap · axial · 0.67mm/px · z∈[-1574,-1019]mm · 10 of 133 slices shown, 12 images]
[im 11/133  soft-tissue]
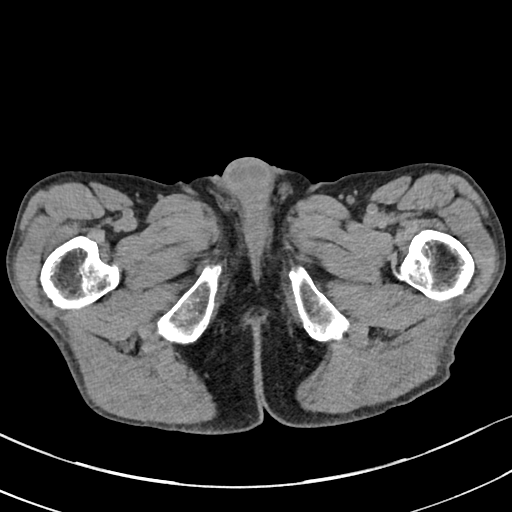
[im 11/133  bone]
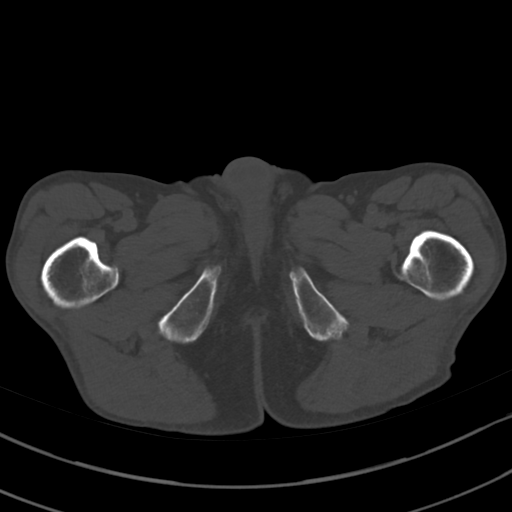
[im 21/133  soft-tissue]
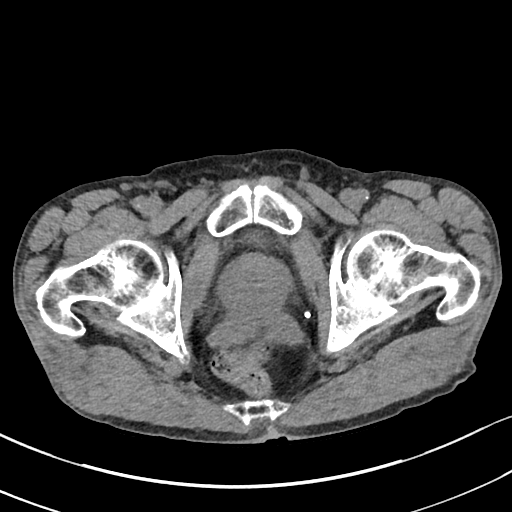
[im 41/133  soft-tissue]
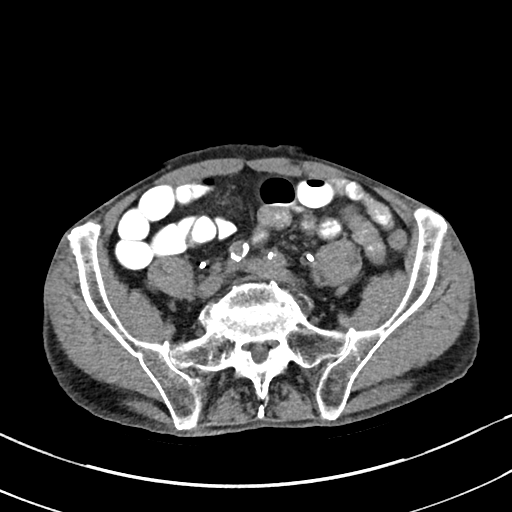
[im 51/133  soft-tissue]
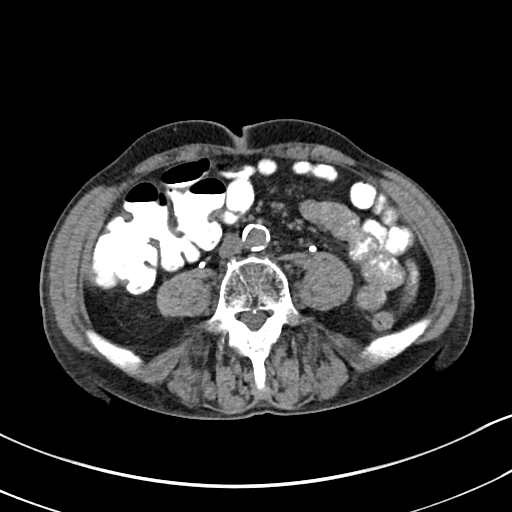
[im 61/133  soft-tissue]
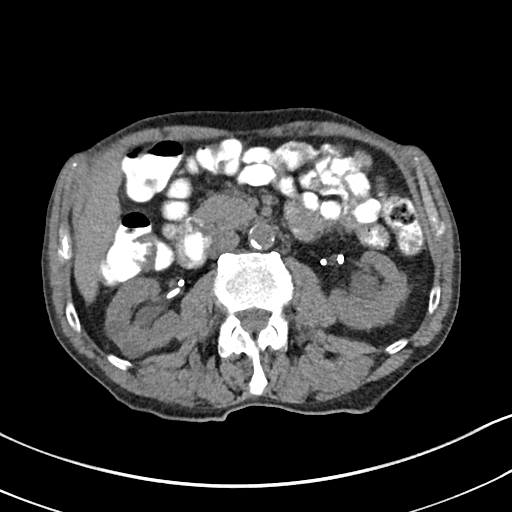
[im 72/133  soft-tissue]
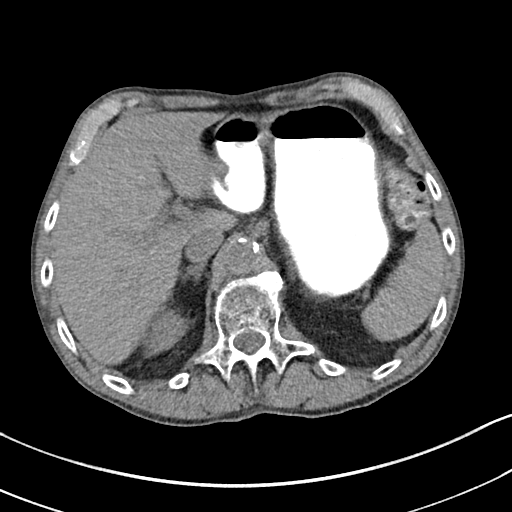
[im 82/133  soft-tissue]
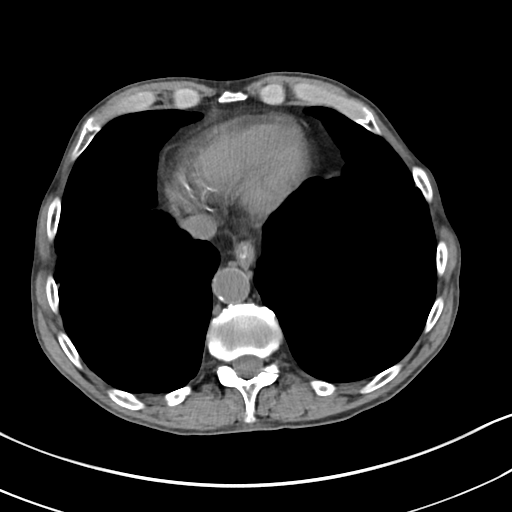
[im 102/133  soft-tissue]
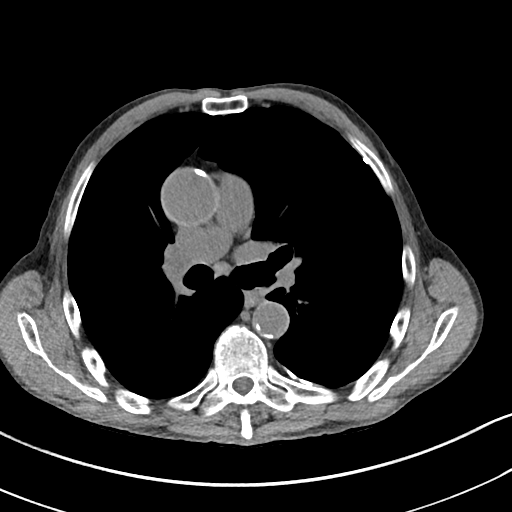
[im 112/133  soft-tissue]
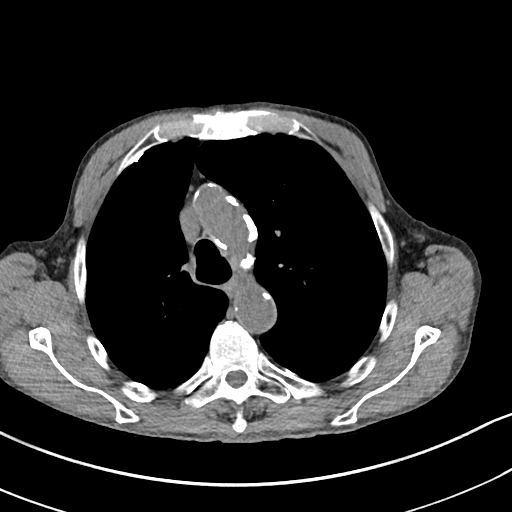
[im 112/133  bone]
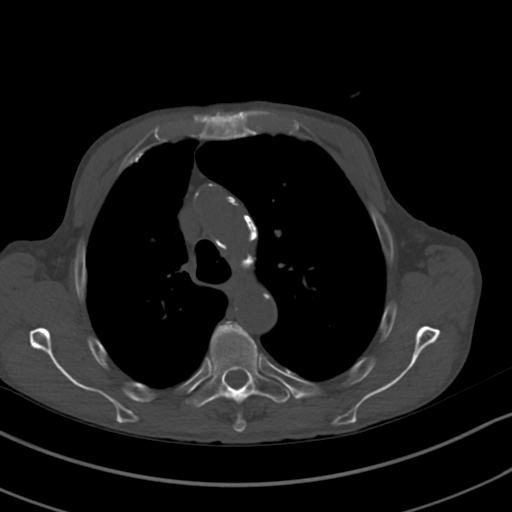
[im 122/133  soft-tissue]
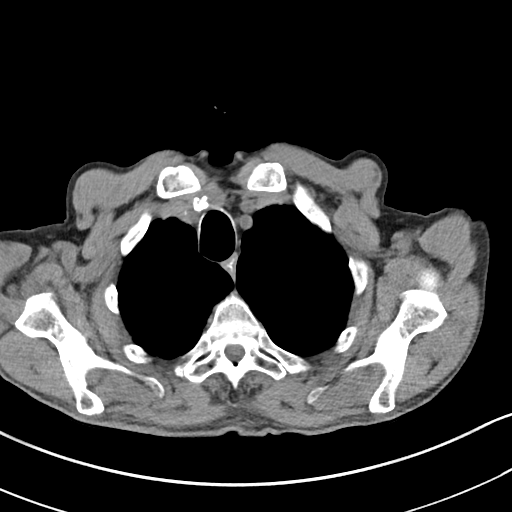

[Series 4: coronals cap · coronal · 0.67mm/px · 3 of 133 slices shown]
[im 45/133  soft-tissue]
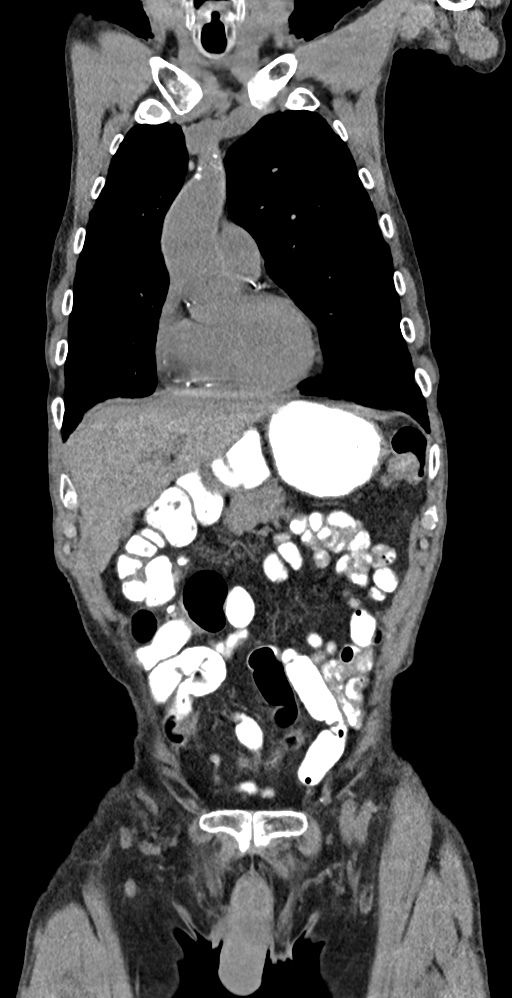
[im 59/133  soft-tissue]
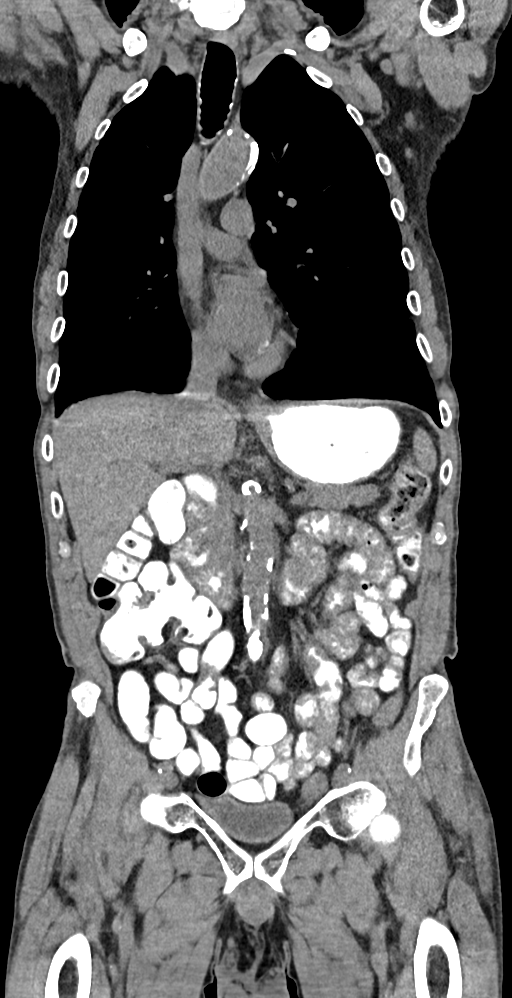
[im 74/133  soft-tissue]
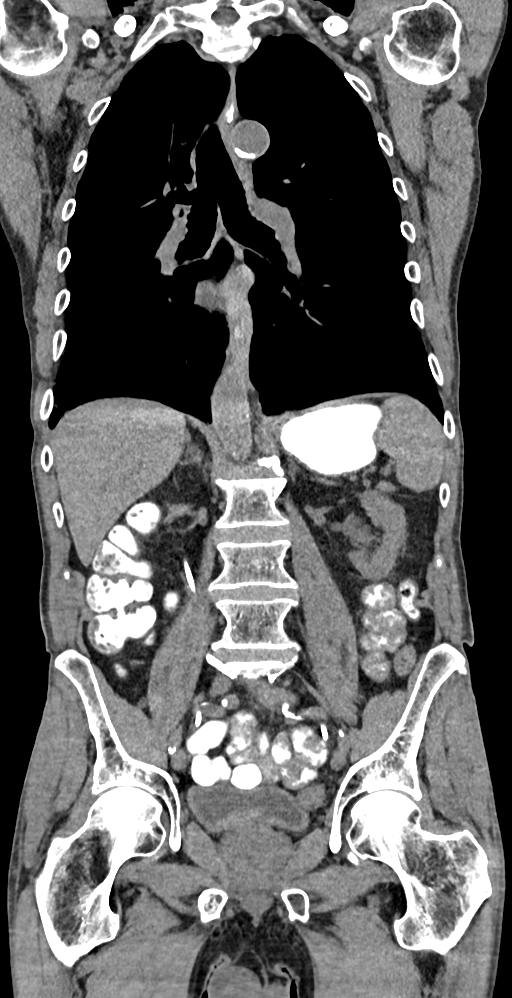

[13 of 46 positions shown; findings below may reference images not displayed]

FINDINGS: CT CHEST FINDINGS

Cardiovascular: Normal heart size. No significant pericardial
effusion/thickening. Three-vessel coronary atherosclerosis.
Atherosclerotic nonaneurysmal thoracic aorta. Normal caliber
pulmonary arteries.

Mediastinum/Nodes: No discrete thyroid nodules. Unremarkable
esophagus. No pathologically enlarged axillary, mediastinal or hilar
lymph nodes, noting limited sensitivity for the detection of hilar
adenopathy on this noncontrast study.

Lungs/Pleura: No pneumothorax. Scattered calcified bilateral pleural
plaques without pleural effusions. No acute consolidative airspace
disease or lung masses. Two scattered tiny solid pulmonary nodules,
largest 2 mm in the peripheral right upper lobe (series 3/image 65),
both stable since [DATE] PET-CT, presumably benign. No new
significant pulmonary nodules. Patchy subpleural reticulation and
ground-glass attenuation in the lower lungs with associated mild
traction bronchiolectasis is not appreciably changed.

Musculoskeletal:  No aggressive appearing focal osseous lesions.

CT ABDOMEN PELVIS FINDINGS

Hepatobiliary: Normal liver with no liver mass. Normal gallbladder
with no radiopaque cholelithiasis. No biliary ductal dilatation.

Pancreas: Normal, with no mass or duct dilation.

Spleen: Normal size. No mass.

Adrenals/Urinary Tract: Normal adrenals. Well-positioned bilateral
nephroureteral stents with the proximal pigtail portions in the
renal pelves and the distal pigtail portions in the bladder. Mild
fullness of the right renal collecting system without overt
hydronephrosis, stable. No left hydronephrosis. Parapelvic left
renal cysts. No renal stones. Simple lower right renal cysts,
largest 1.9 cm. No contour deforming left renal cortical lesions.
Chronic mild diffuse bladder wall thickening is unchanged. No
bladder stones or discrete masses.

Stomach/Bowel: Normal non-distended stomach. Normal caliber small
bowel with no small bowel wall thickening. Normal appendix. Normal
large bowel with no diverticulosis, large bowel wall thickening or
pericolonic fat stranding.

Vascular/Lymphatic: Atherosclerotic nonaneurysmal abdominal aorta.
No pathologically enlarged lymph nodes in the abdomen or pelvis.

Reproductive: Mildly enlarged prostate.

Other: No pneumoperitoneum, ascites or focal fluid collection.

Musculoskeletal: No aggressive appearing focal osseous lesions.
Moderate lumbar spondylosis.
IMPRESSION: 1. No evidence of metastatic disease in the chest, abdomen or
pelvis.
2. Well-positioned bilateral nephroureteral stents without overt
hydronephrosis.
3. Calcified bilateral pleural plaques compatible with asbestos
related pleural disease. No pleural effusions. Stable basilar
predominant pulmonary fibrosis suggestive of asbestosis.
4.  Aortic Atherosclerosis ([90]-[90]).

## 2018-12-16 IMAGING — CT CT CHEST WITHOUT CONTRAST
1 series · 1 of 1 positions shown · non-contrast
Comparison: [DATE] CT abdomen/pelvis.  [DATE] PET-CT.

CLINICAL DATA: Stage IV transitional cell bladder cancer diagnosed
[DATE] with ongoing chemotherapy. Restaging.

EXAM:
CT CHEST, ABDOMEN AND PELVIS WITHOUT CONTRAST
TECHNIQUE: Multidetector CT imaging of the chest, abdomen and pelvis was
performed following the standard protocol without IV contrast.

[Series 1: topogram · coronal · 2.00mm/px · 1 of 1 slices shown]
[im 1/1]
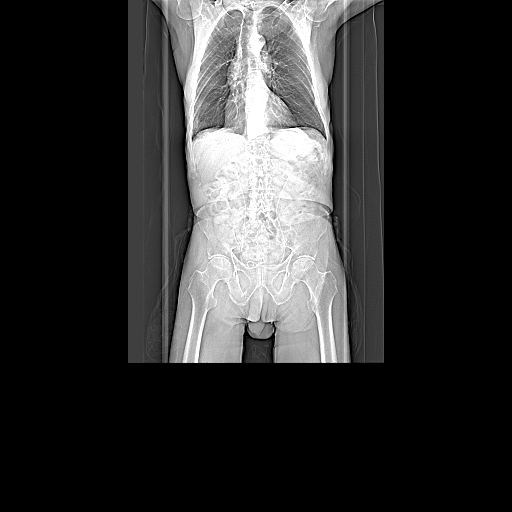

[1 of 1 positions shown; findings below may reference images not displayed]

FINDINGS: CT CHEST FINDINGS

Cardiovascular: Normal heart size. No significant pericardial
effusion/thickening. Three-vessel coronary atherosclerosis.
Atherosclerotic nonaneurysmal thoracic aorta. Normal caliber
pulmonary arteries.

Mediastinum/Nodes: No discrete thyroid nodules. Unremarkable
esophagus. No pathologically enlarged axillary, mediastinal or hilar
lymph nodes, noting limited sensitivity for the detection of hilar
adenopathy on this noncontrast study.

Lungs/Pleura: No pneumothorax. Scattered calcified bilateral pleural
plaques without pleural effusions. No acute consolidative airspace
disease or lung masses. Two scattered tiny solid pulmonary nodules,
largest 2 mm in the peripheral right upper lobe (series 3/image 65),
both stable since [DATE] PET-CT, presumably benign. No new
significant pulmonary nodules. Patchy subpleural reticulation and
ground-glass attenuation in the lower lungs with associated mild
traction bronchiolectasis is not appreciably changed.

Musculoskeletal:  No aggressive appearing focal osseous lesions.

CT ABDOMEN PELVIS FINDINGS

Hepatobiliary: Normal liver with no liver mass. Normal gallbladder
with no radiopaque cholelithiasis. No biliary ductal dilatation.

Pancreas: Normal, with no mass or duct dilation.

Spleen: Normal size. No mass.

Adrenals/Urinary Tract: Normal adrenals. Well-positioned bilateral
nephroureteral stents with the proximal pigtail portions in the
renal pelves and the distal pigtail portions in the bladder. Mild
fullness of the right renal collecting system without overt
hydronephrosis, stable. No left hydronephrosis. Parapelvic left
renal cysts. No renal stones. Simple lower right renal cysts,
largest 1.9 cm. No contour deforming left renal cortical lesions.
Chronic mild diffuse bladder wall thickening is unchanged. No
bladder stones or discrete masses.

Stomach/Bowel: Normal non-distended stomach. Normal caliber small
bowel with no small bowel wall thickening. Normal appendix. Normal
large bowel with no diverticulosis, large bowel wall thickening or
pericolonic fat stranding.

Vascular/Lymphatic: Atherosclerotic nonaneurysmal abdominal aorta.
No pathologically enlarged lymph nodes in the abdomen or pelvis.

Reproductive: Mildly enlarged prostate.

Other: No pneumoperitoneum, ascites or focal fluid collection.

Musculoskeletal: No aggressive appearing focal osseous lesions.
Moderate lumbar spondylosis.
IMPRESSION: 1. No evidence of metastatic disease in the chest, abdomen or
pelvis.
2. Well-positioned bilateral nephroureteral stents without overt
hydronephrosis.
3. Calcified bilateral pleural plaques compatible with asbestos
related pleural disease. No pleural effusions. Stable basilar
predominant pulmonary fibrosis suggestive of asbestosis.
4.  Aortic Atherosclerosis ([90]-[90]).

## 2018-12-19 ENCOUNTER — Other Ambulatory Visit: Payer: Self-pay

## 2018-12-20 ENCOUNTER — Inpatient Hospital Stay: Payer: Medicare HMO

## 2018-12-20 ENCOUNTER — Inpatient Hospital Stay (HOSPITAL_BASED_OUTPATIENT_CLINIC_OR_DEPARTMENT_OTHER): Payer: Medicare HMO | Admitting: Internal Medicine

## 2018-12-20 ENCOUNTER — Other Ambulatory Visit: Payer: Self-pay

## 2018-12-20 ENCOUNTER — Other Ambulatory Visit: Payer: Self-pay | Admitting: *Deleted

## 2018-12-20 ENCOUNTER — Inpatient Hospital Stay: Payer: Medicare HMO | Attending: Internal Medicine

## 2018-12-20 DIAGNOSIS — G8929 Other chronic pain: Secondary | ICD-10-CM | POA: Diagnosis not present

## 2018-12-20 DIAGNOSIS — Z5112 Encounter for antineoplastic immunotherapy: Secondary | ICD-10-CM | POA: Diagnosis present

## 2018-12-20 DIAGNOSIS — R0981 Nasal congestion: Secondary | ICD-10-CM

## 2018-12-20 DIAGNOSIS — D631 Anemia in chronic kidney disease: Secondary | ICD-10-CM | POA: Diagnosis not present

## 2018-12-20 DIAGNOSIS — E032 Hypothyroidism due to medicaments and other exogenous substances: Secondary | ICD-10-CM

## 2018-12-20 DIAGNOSIS — Z7189 Other specified counseling: Secondary | ICD-10-CM

## 2018-12-20 DIAGNOSIS — Z87891 Personal history of nicotine dependence: Secondary | ICD-10-CM

## 2018-12-20 DIAGNOSIS — C772 Secondary and unspecified malignant neoplasm of intra-abdominal lymph nodes: Secondary | ICD-10-CM | POA: Insufficient documentation

## 2018-12-20 DIAGNOSIS — M545 Low back pain: Secondary | ICD-10-CM

## 2018-12-20 DIAGNOSIS — R5383 Other fatigue: Secondary | ICD-10-CM

## 2018-12-20 DIAGNOSIS — J309 Allergic rhinitis, unspecified: Secondary | ICD-10-CM | POA: Diagnosis not present

## 2018-12-20 DIAGNOSIS — C678 Malignant neoplasm of overlapping sites of bladder: Secondary | ICD-10-CM | POA: Diagnosis not present

## 2018-12-20 DIAGNOSIS — N183 Chronic kidney disease, stage 3 (moderate): Secondary | ICD-10-CM

## 2018-12-20 LAB — COMPREHENSIVE METABOLIC PANEL
ALT: 15 U/L (ref 0–44)
AST: 20 U/L (ref 15–41)
Albumin: 4.1 g/dL (ref 3.5–5.0)
Alkaline Phosphatase: 72 U/L (ref 38–126)
Anion gap: 8 (ref 5–15)
BUN: 33 mg/dL — ABNORMAL HIGH (ref 8–23)
CO2: 23 mmol/L (ref 22–32)
Calcium: 9.2 mg/dL (ref 8.9–10.3)
Chloride: 110 mmol/L (ref 98–111)
Creatinine, Ser: 2.24 mg/dL — ABNORMAL HIGH (ref 0.61–1.24)
GFR calc Af Amer: 30 mL/min — ABNORMAL LOW (ref >60)
GFR calc non Af Amer: 26 mL/min — ABNORMAL LOW (ref >60)
Glucose, Bld: 108 mg/dL — ABNORMAL HIGH (ref 70–99)
Potassium: 4.6 mmol/L (ref 3.5–5.1)
Sodium: 141 mmol/L (ref 135–145)
Total Bilirubin: 0.7 mg/dL (ref 0.3–1.2)
Total Protein: 7 g/dL (ref 6.5–8.1)

## 2018-12-20 LAB — CBC WITH DIFFERENTIAL/PLATELET
Abs Immature Granulocytes: 0.01 10*3/uL (ref 0.00–0.07)
Basophils Absolute: 0.1 10*3/uL (ref 0.0–0.1)
Basophils Relative: 1 %
Eosinophils Absolute: 0.9 10*3/uL — ABNORMAL HIGH (ref 0.0–0.5)
Eosinophils Relative: 13 %
HCT: 29.1 % — ABNORMAL LOW (ref 39.0–52.0)
Hemoglobin: 9.7 g/dL — ABNORMAL LOW (ref 13.0–17.0)
Immature Granulocytes: 0 %
Lymphocytes Relative: 27 %
Lymphs Abs: 1.9 10*3/uL (ref 0.7–4.0)
MCH: 33.6 pg (ref 26.0–34.0)
MCHC: 33.3 g/dL (ref 30.0–36.0)
MCV: 100.7 fL — ABNORMAL HIGH (ref 80.0–100.0)
Monocytes Absolute: 0.6 10*3/uL (ref 0.1–1.0)
Monocytes Relative: 8 %
Neutro Abs: 3.6 10*3/uL (ref 1.7–7.7)
Neutrophils Relative %: 51 %
Platelets: 216 10*3/uL (ref 150–400)
RBC: 2.89 MIL/uL — ABNORMAL LOW (ref 4.22–5.81)
RDW: 12.3 % (ref 11.5–15.5)
WBC: 7 10*3/uL (ref 4.0–10.5)
nRBC: 0 % (ref 0.0–0.2)

## 2018-12-20 LAB — TSH: TSH: 0.482 u[IU]/mL (ref 0.350–4.500)

## 2018-12-20 MED ORDER — SODIUM CHLORIDE 0.9 % IV SOLN
1200.0000 mg | Freq: Once | INTRAVENOUS | Status: AC
  Administered 2018-12-20: 1200 mg via INTRAVENOUS
  Filled 2018-12-20: qty 20

## 2018-12-20 MED ORDER — SODIUM CHLORIDE 0.9 % IV SOLN
Freq: Once | INTRAVENOUS | Status: AC
  Administered 2018-12-20: 10:00:00 via INTRAVENOUS
  Filled 2018-12-20: qty 250

## 2018-12-20 NOTE — Assessment & Plan Note (Addendum)
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  Stage IV;  JULY 2020- CT- NED;  thickening of bladder.  Stable  # on Tecentiq; Labs today reviewed;  acceptable for treatment today.   # Iatrogenic hypothyroidism-on Synthroid 88 mcg tolerating well stable.    # Anemia sec to CKD/ on Iv iron.hb-9.3; Iron sat-  Stable.   # nasal congestion- recommend claritin.   #CKD stage III-IV stable.  continue flomax 0.4. continue PO fluids.   # I updated patient's son Ronalee Belts over the phone.   # DISPOSITION:  # treatment today. # Follow-up in 3 weeks-MD labs-cbc/cmp-Tecentriq IV-Dr.B

## 2018-12-20 NOTE — Progress Notes (Signed)
Patient's nose has been "stopped up" for about a week and he has an appt with PCP.

## 2018-12-20 NOTE — Progress Notes (Signed)
[facility] CONSULT NOTE  Patient Care Team: [name], MD as PCP - General (Internal Medicine)  CHIEF COMPLAINTS/PURPOSE OF CONSULTATION:  Bladder cancer  #  Oncology History Overview Note  # AUG 2019-TRANSITIONAL CELL BLADDER CA [~ 4cm tumor] s/p cystoscopy [Dr.Stoiff]  with extensive angiolymphatic invasion; [name]; [name]; mediastinal uptake; right pubic rami uptake.  # 19th ep 2019- Tecentriq   # Match 2020- HYPOTHYROIDISM [sec to Tecen]  # CKD stage III-IV [creat 2.5]  # Molecular testing- PDL-1 CPS- 20%; NO other targets**  DIAGNOSIS: Bladder ca  STAGE:   IV  ;GOALS: palliative  CURRENT/MOST RECENT THERAPY:Tecentriq      Cancer of overlapping sites of bladder ([place])  02/28/2018 -  Chemotherapy   The patient had atezolizumab (TECENTRIQ) 1,[name], 1,200 mg, Intravenous, Once, 13 of 14 cycles Administration: 1,200 mg (02/28/2018), 1,200 mg (03/21/2018), 1,200 mg (04/11/2018), 1,200 mg (05/02/2018), 1,200 mg (06/13/2018), 1,200 mg (05/23/2018), 1,200 mg (07/04/2018), 1,200 mg (07/25/2018), 1,200 mg (08/15/2018), 1,200 mg (09/05/2018), 1,200 mg (10/25/2018), 1,200 mg (11/22/2018), 1,200 mg (12/20/2018)  for chemotherapy treatment.       HISTORY OF PRESENTING ILLNESS: Edward Cummings 85 y.o.  male with metastatic transitional carcinoma of the bladder currently on Tecentriq is here for follow-up.   Patient denies any blood in urine.  Denies any nausea vomiting diarrhea.  No new shortness of breath or cough.  Mild fatigue chronic back pain.  Complains of nasal congestion.  Otherwise no new cough or shortness of breath.  Review of Systems  Constitutional: Positive for malaise/fatigue. Negative for chills, diaphoresis and fever.  HENT: Negative for nosebleeds and sore throat.   Eyes: Negative for  double vision.  Respiratory: Negative for cough, hemoptysis, sputum production, shortness of breath and wheezing.   Cardiovascular: Negative for chest pain, palpitations, orthopnea and leg swelling.  Gastrointestinal: Negative for abdominal pain, blood in stool, diarrhea, heartburn, melena, nausea and vomiting.  Genitourinary: Negative for dysuria, frequency and urgency.  Musculoskeletal: Positive for back pain. Negative for joint pain.  Skin: Negative.  Negative for itching and rash.  Neurological: Negative for tingling, focal weakness, weakness and headaches.  Endo/Heme/Allergies: Does not bruise/bleed easily.  Psychiatric/Behavioral: Negative for depression. The patient is not nervous/anxious and does not have insomnia.      MEDICAL HISTORY:  Past Medical History:  Diagnosis Date  . Anemia   . Cancer ([place])   . Chronic kidney disease   . Depression   . Hypertension   . Neuromuscular disorder ([place])    Nerve damage to left face/eye since around 2002.    SURGICAL HISTORY: Past Surgical History:  Procedure Laterality Date  . CYSTOSCOPY W/ RETROGRADES Bilateral 01/25/2018   Procedure: CYSTOSCOPY WITH RETROGRADE PYELOGRAM;  [name], MD;  [name];  Service: Urology;  Laterality: Bilateral;  . CYSTOSCOPY WITH STENT PLACEMENT Bilateral 01/25/2018   Procedure: CYSTOSCOPY WITH STENT PLACEMENT;  [name], MD;  [name];  Service: Urology;  Laterality: Bilateral;  . EYE SURGERY     Cornea transplants bilaterally & cataract surgery.  . TRANSURETHRAL RESECTION OF BLADDER TUMOR N/A 01/25/2018   Procedure: TRANSURETHRAL RESECTION OF BLADDER TUMOR (TURBT);  [name], MD;  [name];  Service: Urology;  [name];    SOCIAL HISTORY: lives in [place]; with wife; quit  smoking 18 years ago; beer every 2 months or so.mechanic/retd.   Social History   Socioeconomic History  . Marital status: Married    Spouse  name: Not on file  . Number of children: Not on file  . Years of education: Not on file  . Highest education level: Not on file  Occupational History  . Not on file  Social Needs  . Financial resource strain: Not hard at all  . Food insecurity    Worry: Never true    Inability: Never true  . Transportation needs    Medical: Not on file    Non-medical: Not on file  Tobacco Use  . Smoking status: Former Research scientist (life sciences)  . Smokeless tobacco: Current User    Types: Chew  . Tobacco comment: Stopped approximately 10 years ago.  Substance and Sexual Activity  . Alcohol use: Yes    Alcohol/week: 2.0 standard drinks    Types: 2 Cans of beer per week    Comment: Daily  . Drug use: Never  . Sexual activity: Not on file  Lifestyle  . Physical activity    Days per week: Not on file    Minutes per session: Not on file  . Stress: Only a little  Relationships  . Social Herbalist on phone: Not on file    Gets together: Not on file    Attends religious service: Not on file    Active member of club or organization: Not on file    Attends meetings of clubs or organizations: Not on file    Relationship status: Not on file  . Intimate partner violence    Fear of current or ex partner: Not on file    Emotionally abused: Not on file    Physically abused: Not on file    Forced sexual activity: Not on file  Other Topics Concern  . Not on file  Social History Narrative  . Not on file    FAMILY HISTORY: Family History  Problem Relation Age of Onset  . Prostate cancer Neg Hx   . Kidney cancer Neg Hx   . Bladder Cancer Neg Hx     ALLERGIES:  has No Known Allergies.  MEDICATIONS:  Current Outpatient Medications  Medication Sig Dispense Refill  . amLODipine (NORVASC) 5 MG tablet Take 1 tablet (5 mg total) by mouth daily. 30 tablet 3  . aspirin EC 81 MG tablet Take 81 mg by mouth daily.     . diphenhydrAMINE (BENADRYL) 25 MG tablet Take 25 mg by mouth daily as needed (ALLERGIES).     Marland Kitchen docusate sodium (COLACE) 100 MG capsule Take 2 capsules (200 mg total) by mouth 2 (two) times daily as needed for mild constipation. 10 capsule 0  . HYDROcodone-acetaminophen (NORCO/VICODIN) 5-325 MG tablet Take 1 tablet by mouth every 8 (eight) hours as needed for moderate pain. 60 tablet 0  . levothyroxine (SYNTHROID) 88 MCG tablet Take 1 tablet (88 mcg total) by mouth daily before breakfast. Empty stomach- 1 hour prior to breakfast. 30 tablet 3  . Multiple Vitamin (MULTIVITAMIN WITH MINERALS) TABS tablet Take 1 tablet by mouth daily. One-A-Day    . oxybutynin (DITROPAN) 5 MG tablet 1 tab tid prn frequency,urgency, bladder spasm (Patient taking differently: Take 5 mg by mouth 3 (three) times daily. ) 15 tablet 0  . prednisoLONE acetate (PRED FORTE) 1 % ophthalmic suspension Place 1 drop into both eyes daily.    . tamsulosin (FLOMAX) 0.4 MG CAPS capsule  Take 1 capsule (0.4 mg total) by mouth at bedtime. 90 capsule 1   No current facility-administered medications for this visit.       Marland Kitchen  PHYSICAL EXAMINATION: ECOG PERFORMANCE STATUS: 1 - Symptomatic but completely ambulatory  Vitals:   12/20/18 0856  BP: (!) 164/67  Pulse: (!) 51  Resp: 18  Temp: (!) 96.4 F (35.8 C)   Filed Weights   12/20/18 0856  Weight: 127 lb 12.8 oz (58 kg)    Physical Exam  Constitutional: He is oriented to person, place, and time.  Alone.   Walking himself.  Thin built moderately nourished male patient.  HENT:  Head: Normocephalic and atraumatic.  Mouth/Throat: Oropharynx is clear and moist. No oropharyngeal exudate.  Eyes: Pupils are equal, round, and reactive to light.  Chronic drooping of the left eyelid.  Neck: Normal range of motion. Neck supple.  Cardiovascular: Normal rate and regular rhythm.  Pulmonary/Chest: No respiratory distress. He has no wheezes.  Abdominal: Soft. Bowel sounds are normal. He exhibits no distension and no mass. There is no abdominal tenderness. There is no rebound and  no guarding.  Musculoskeletal: Normal range of motion.        General: No tenderness or edema.  Neurological: He is alert and oriented to person, place, and time.  Skin: Skin is warm.  Psychiatric: Affect normal.     LABORATORY DATA:  I have reviewed the data as listed Lab Results  Component Value Date   WBC 7.0 12/20/2018   HGB 9.7 (L) 12/20/2018   HCT 29.1 (L) 12/20/2018   MCV 100.7 (H) 12/20/2018   PLT 216 12/20/2018   Recent Labs    10/25/18 0904 11/22/18 0855 12/20/18 0827  NA 139 139 141  K 4.0 4.2 4.6  CL 110 111 110  CO2 _0 GLUCOSE 126* 125* 108*  BUN 32* 27* 33*  CREATININE 2.19* 1.95* 2.24*  CALCIUM 8.9 8.9 9.2  GFRNONAA 27* 31* 26*  GFRAA 31* 36* 30*  PROT 6.7 6.7 7.0  ALBUMIN 3.8 4.0 4.1  AST _1 ALT _2 ALKPHOS 69 68 72  BILITOT 0.6 0.7 0.7    RADIOGRAPHIC STUDIES: I have personally reviewed the radiological images as listed and agreed with the findings in the report. Ct Abdomen Pelvis Wo Contrast  Result Date: 12/16/2018 CLINICAL DATA:  Stage IV transitional cell bladder cancer diagnosed August 2019 with ongoing chemotherapy. Restaging. EXAM: CT CHEST, ABDOMEN AND PELVIS WITHOUT CONTRAST TECHNIQUE: Multidetector CT imaging of the chest, abdomen and pelvis was performed following the standard protocol without IV contrast. COMPARISON:  07/23/2018 CT abdomen/pelvis.  02/26/2018 PET-CT. FINDINGS: CT CHEST FINDINGS Cardiovascular: Normal heart size. No significant pericardial effusion/thickening. Three-vessel coronary atherosclerosis. Atherosclerotic nonaneurysmal thoracic aorta. Normal caliber pulmonary arteries. Mediastinum/Nodes: No discrete thyroid nodules. Unremarkable esophagus. No pathologically enlarged axillary, mediastinal or hilar lymph nodes, noting limited sensitivity for the detection of hilar adenopathy on this noncontrast study. Lungs/Pleura: No pneumothorax. Scattered calcified bilateral pleural plaques without pleural  effusions. No acute consolidative airspace disease or lung masses. Two scattered tiny solid pulmonary nodules, largest 2 mm in the peripheral right upper lobe (series 3/image 65), both stable since 02/26/2018 PET-CT, presumably benign. No new significant pulmonary nodules. Patchy subpleural reticulation and ground-glass attenuation in the lower lungs with associated mild traction bronchiolectasis is not appreciably changed. Musculoskeletal:  No aggressive appearing focal osseous lesions. CT ABDOMEN PELVIS FINDINGS Hepatobiliary: Normal liver with no liver mass. Normal gallbladder with no  radiopaque cholelithiasis. No biliary ductal dilatation. Pancreas: Normal, with no mass or duct dilation. Spleen: Normal size. No mass. Adrenals/Urinary Tract: Normal adrenals. Well-positioned bilateral nephroureteral stents with the proximal pigtail portions in the renal pelves and the distal pigtail portions in the bladder. Mild fullness of the right renal collecting system without overt hydronephrosis, stable. No left hydronephrosis. Parapelvic left renal cysts. No renal stones. Simple lower right renal cysts, largest 1.9 cm. No contour deforming left renal cortical lesions. Chronic mild diffuse bladder wall thickening is unchanged. No bladder stones or discrete masses. Stomach/Bowel: Normal non-distended stomach. Normal caliber small bowel with no small bowel wall thickening. Normal appendix. Normal large bowel with no diverticulosis, large bowel wall thickening or pericolonic fat stranding. Vascular/Lymphatic: Atherosclerotic nonaneurysmal abdominal aorta. No pathologically enlarged lymph nodes in the abdomen or pelvis. Reproductive: Mildly enlarged prostate. Other: No pneumoperitoneum, ascites or focal fluid collection. Musculoskeletal: No aggressive appearing focal osseous lesions. Moderate lumbar spondylosis. IMPRESSION: 1. No evidence of metastatic disease in the chest, abdomen or pelvis. 2. Well-positioned bilateral  nephroureteral stents without overt hydronephrosis. 3. 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 No pleural effusions. Stable basilar predominant pulmonary fibrosis suggestive of asbestosis. 4.  Aortic Atherosclerosis (ICD10-I70.0). Electronically Signed   By: Ilona Sorrel M.D.   On: 12/16/2018 13:35   Ct Chest Wo Contrast  Result Date: 12/16/2018 CLINICAL DATA:  Stage IV transitional cell bladder cancer diagnosed August 2019 with ongoing chemotherapy. Restaging. EXAM: CT CHEST, ABDOMEN AND PELVIS WITHOUT CONTRAST TECHNIQUE: Multidetector CT imaging of the chest, abdomen and pelvis was performed following the standard protocol without IV contrast. COMPARISON:  07/23/2018 CT abdomen/pelvis.  02/26/2018 PET-CT. FINDINGS: CT CHEST FINDINGS Cardiovascular: Normal heart size. No significant pericardial effusion/thickening. Three-vessel coronary atherosclerosis. Atherosclerotic nonaneurysmal thoracic aorta. Normal caliber pulmonary arteries. Mediastinum/Nodes: No discrete thyroid nodules. Unremarkable esophagus. No pathologically enlarged axillary, mediastinal or hilar lymph nodes, noting limited sensitivity for the detection of hilar adenopathy on this noncontrast study. Lungs/Pleura: No pneumothorax. Scattered calcified bilateral pleural plaques without pleural effusions. No acute consolidative airspace disease or lung masses. Two scattered tiny solid pulmonary nodules, largest 2 mm in the peripheral right upper lobe (series 3/image 65), both stable since 02/26/2018 PET-CT, presumably benign. No new significant pulmonary nodules. Patchy subpleural reticulation and ground-glass attenuation in the lower lungs with associated mild traction bronchiolectasis is not appreciably changed. Musculoskeletal:  No aggressive appearing focal osseous lesions. CT ABDOMEN PELVIS FINDINGS Hepatobiliary: Normal liver with no liver mass. Normal gallbladder with no radiopaque cholelithiasis. No  biliary ductal dilatation. Pancreas: Normal, with no mass or duct dilation. Spleen: Normal size. No mass. Adrenals/Urinary Tract: Normal adrenals. Well-positioned bilateral nephroureteral stents with the proximal pigtail portions in the renal pelves and the distal pigtail portions in the bladder. Mild fullness of the right renal collecting system without overt hydronephrosis, stable. No left hydronephrosis. Parapelvic left renal cysts. No renal stones. Simple lower right renal cysts, largest 1.9 cm. No contour deforming left renal cortical lesions. Chronic mild diffuse bladder wall thickening is unchanged. No bladder stones or discrete masses. Stomach/Bowel: Normal non-distended stomach. Normal caliber small bowel with no small bowel wall thickening. Normal appendix. Normal large bowel with no diverticulosis, large bowel wall thickening or pericolonic fat stranding. Vascular/Lymphatic: Atherosclerotic nonaneurysmal abdominal aorta. No pathologically enlarged lymph nodes in the abdomen or pelvis. Reproductive: Mildly enlarged prostate. Other: No pneumoperitoneum, ascites or focal fluid collection. Musculoskeletal: No aggressive appearing focal osseous lesions. Moderate lumbar spondylosis. IMPRESSION: 1. No evidence of metastatic disease in  the chest, abdomen or pelvis. 2. Well-positioned bilateral nephroureteral stents without overt hydronephrosis. 3. 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 No pleural effusions. Stable basilar predominant pulmonary fibrosis suggestive of asbestosis. 4.  Aortic Atherosclerosis (ICD10-I70.0). Electronically Signed   By: Ilona Sorrel M.D.   On: 12/16/2018 13:35    ASSESSMENT & PLAN:   Cancer of overlapping sites of bladder ([place]) # High-grade transitional cell carcinoma of the bladder metastatic to retroperitoneal lymph node.  Stage IV;  JULY 2020- CT- NED;  thickening of bladder.  Stable  # on Tecentiq; Labs today reviewed;   acceptable for treatment today.   # Iatrogenic hypothyroidism-on Synthroid 88 mcg tolerating well stable.    # Anemia sec to CKD/ on Iv iron.hb-9.3; Iron sat-  Stable.   # nasal congestion- recommend claritin.   #CKD stage III-IV stable.  continue flomax 0.4. continue PO fluids.   # I updated patient's son Ronalee Belts over the phone.   # DISPOSITION:  # treatment today. # Follow-up in 3 weeks-MD labs-cbc/cmp-Tecentriq IV-Dr.B  All questions were answered. The patient knows to call the clinic with any problems, questions or concerns.    Cammie Sickle, MD 12/22/2018 6:49 PM

## 2018-12-26 ENCOUNTER — Ambulatory Visit: Payer: Medicare HMO | Admitting: Urology

## 2018-12-26 ENCOUNTER — Other Ambulatory Visit: Payer: Self-pay

## 2018-12-26 ENCOUNTER — Encounter: Payer: Self-pay | Admitting: Urology

## 2018-12-26 DIAGNOSIS — C679 Malignant neoplasm of bladder, unspecified: Secondary | ICD-10-CM | POA: Diagnosis not present

## 2018-12-26 DIAGNOSIS — N471 Phimosis: Secondary | ICD-10-CM | POA: Diagnosis not present

## 2018-12-26 DIAGNOSIS — T191XXD Foreign body in bladder, subsequent encounter: Secondary | ICD-10-CM

## 2018-12-26 NOTE — H&P (View-Only) (Signed)
12/26/2018 2:13 PM   [name] Jan 09, 1933 [id number]  Referring provider: [name], MD [address] [place],  [place] [address]  Chief Complaint  Patient presents with  . Phimosis    HPI: 85 year old male presents for evaluation of phimosis.  He is actually an established patient with a history of urothelial carcinoma the bladder.  He underwent TURBT 01/25/2018 for a 4 cm bladder mass and bilateral hydronephrosis.  He was noted to have moderate right hydronephrosis and hydroureter to the distal ureter on retrograde and mild left hydronephrosis to the distal ureter and underwent bilateral stent placement.  He was subsequently found to have metastatic disease to the retroperitoneal lymph nodes with bulky retroperitoneal adenopathy, mediastinal uptake and right pubic rami uptake.  He has been undergoing palliative treatment with [facility]  He has not had urology follow-up or follow-up cystoscopy.  His ureteral stents remain indwelling.  He did have a CT earlier this month and it does not look like the stents are encrusted.  The main reason for his visit today was for progressive difficulty retracting his foreskin.   PMH: Past Medical History:  Diagnosis Date  . Anemia   . Cancer ([place])   . Chronic kidney disease   . Depression   . Hypertension   . Neuromuscular disorder ([place])    Nerve damage to left face/eye since around 2002.    Surgical History: Past Surgical History:  Procedure Laterality Date  . CYSTOSCOPY W/ RETROGRADES Bilateral 01/25/2018   Procedure: CYSTOSCOPY WITH RETROGRADE PYELOGRAM;  [name], MD;  [name];  Service: Urology;  Laterality: Bilateral;  . CYSTOSCOPY WITH STENT PLACEMENT Bilateral 01/25/2018   Procedure: CYSTOSCOPY WITH STENT PLACEMENT;  [name], MD;  [name];  Service: Urology;  Laterality: Bilateral;  . EYE SURGERY     Cornea transplants bilaterally & cataract surgery.  . TRANSURETHRAL  RESECTION OF BLADDER TUMOR N/A 01/25/2018   Procedure: TRANSURETHRAL RESECTION OF BLADDER TUMOR (TURBT);  [name], MD;  [name];  Service: Urology;  [name];    Home Medications:  Allergies as of 12/26/2018   No Known Allergies     Medication List       Accurate as of December 26, 2018  2:13 PM. If you have any questions, ask your nurse or doctor.        amLODipine 5 MG tablet Commonly known as: NORVASC Take 1 tablet (5 mg total) by mouth daily.   aspirin EC 81 MG tablet Take 81 mg by mouth daily.   diphenhydrAMINE 25 MG tablet Commonly known as: BENADRYL Take 25 mg by mouth daily as needed (ALLERGIES).   docusate sodium 100 MG capsule Commonly known as: COLACE Take 2 capsules (200 mg total) by mouth 2 (two) times daily as needed for mild constipation.   HYDROcodone-acetaminophen 5-325 MG tablet Commonly known as: NORCO/VICODIN Take 1 tablet by mouth every 8 (eight) hours as needed for moderate pain.   levothyroxine 88 MCG tablet Commonly known as: Synthroid Take 1 tablet (88 mcg total) by mouth daily before breakfast. Empty stomach- 1 hour prior to breakfast.   multivitamin with minerals Tabs tablet Take 1 tablet by mouth daily. One-A-Day   oxybutynin 5 MG tablet Commonly known as: DITROPAN 1 tab tid prn frequency,urgency, bladder spasm What changed:   how much to take  how to take this  when to take this  additional instructions   prednisoLONE acetate 1 % ophthalmic suspension  Commonly known as: PRED FORTE Place 1 drop into both eyes daily.   tamsulosin 0.4 MG Caps capsule Commonly known as: FLOMAX Take 1 capsule (0.4 mg total) by mouth at bedtime.       Allergies: No Known Allergies  Family History: Family History  Problem Relation Age of Onset  . Prostate cancer Neg Hx   . Kidney cancer Neg Hx   . Bladder Cancer Neg Hx     Social History:  reports that he has quit smoking. His smokeless tobacco use includes  chew. He reports current alcohol use of about 2.0 standard drinks of alcohol per week. He reports that he does not use drugs.  ROS: UROLOGY Frequent Urination?: No Hard to postpone urination?: No Burning/pain with urination?: No Get up at night to urinate?: No Leakage of urine?: No Urine stream starts and stops?: No Trouble starting stream?: No Do you have to strain to urinate?: No Blood in urine?: No Urinary tract infection?: No Sexually transmitted disease?: No Injury to kidneys or bladder?: No Painful intercourse?: No Weak stream?: No Erection problems?: No Penile pain?: No  Gastrointestinal Nausea?: No Vomiting?: No Indigestion/heartburn?: No Diarrhea?: No Constipation?: No  Constitutional Fever: No Night sweats?: No Weight loss?: No Fatigue?: No  Skin Skin rash/lesions?: No Itching?: No  Eyes Blurred vision?: No Double vision?: No  Ears/Nose/Throat Sore throat?: No Sinus problems?: No  Hematologic/Lymphatic Swollen glands?: No Easy bruising?: No  Cardiovascular Leg swelling?: No Chest pain?: No  Respiratory Cough?: No Shortness of breath?: No  Endocrine Excessive thirst?: No  Musculoskeletal Back pain?: No Joint pain?: No  Neurological Headaches?: No Dizziness?: No  Psychologic Depression?: No Anxiety?: No  Physical Exam: BP (!) 158/68   Pulse 87   Ht 5\' 9"  (1.753 m)   Wt 127 lb (57.6 kg)   BMI 18.75 kg/m   Constitutional:  Alert and oriented, No acute distress. HEENT: [place] AT, moist mucus membranes.  Trachea midline, no masses. Cardiovascular: No clubbing, cyanosis, or edema.RRR Respiratory: Normal respiratory effort, no increased work of breathing.  Clear GI: Abdomen is soft, nontender, nondistended, no abdominal masses GU: Phallus uncircumcised with inability to retract foreskin.  Meatus not visualized due to significant phimosis. Lymph: No cervical or inguinal lymphadenopathy. Skin: No rashes, bruises or suspicious lesions.  Neurologic: Grossly intact, no focal deficits, moving all 4 extremities. Psychiatric: Normal mood and affect.   Assessment & Plan:   85 year old male with metastatic urothelial carcinoma the bladder on palliative therapy.  He does have bilateral hydronephrosis and his stents have been indwelling for 11 months.  I recommended scheduling cystoscopy with bilateral retrograde pyelograms and stent exchange.  We discussed options for his phimosis including circumcision versus dorsal slit.  He has elected dorsal slit and will perform under the same anesthesia.  The procedures were discussed in detail occluding potential risks of bleeding, infection/sepsis as well as anesthetic risk.  He indicated all questions were answered and desires to proceed.   Abbie Sons, [place] [address] [place], [place] [address] [phone]

## 2018-12-26 NOTE — Progress Notes (Signed)
12/26/2018 2:13 PM   Edward Cummings 1932-07-16 [id number]  Referring provider: [name], MD [address] [place],  [place] [address]  Chief Complaint  Patient presents with  . Phimosis    HPI: 85 year old male presents for evaluation of phimosis.  He is actually an established patient with a history of urothelial carcinoma the bladder.  He underwent TURBT 01/25/2018 for a 4 cm bladder mass and bilateral hydronephrosis.  He was noted to have moderate right hydronephrosis and hydroureter to the distal ureter on retrograde and mild left hydronephrosis to the distal ureter and underwent bilateral stent placement.  He was subsequently found to have metastatic disease to the retroperitoneal lymph nodes with bulky retroperitoneal adenopathy, mediastinal uptake and right pubic rami uptake.  He has been undergoing palliative treatment with [facility]  He has not had urology follow-up or follow-up cystoscopy.  His ureteral stents remain indwelling.  He did have a CT earlier this month and it does not look like the stents are encrusted.  The main reason for his visit today was for progressive difficulty retracting his foreskin.   PMH: Past Medical History:  Diagnosis Date  . Anemia   . Cancer ([place])   . Chronic kidney disease   . Depression   . Hypertension   . Neuromuscular disorder ([place])    Nerve damage to left face/eye since around 2002.    Surgical History: Past Surgical History:  Procedure Laterality Date  . CYSTOSCOPY W/ RETROGRADES Bilateral 01/25/2018   Procedure: CYSTOSCOPY WITH RETROGRADE PYELOGRAM;  [name], MD;  [name];  Service: Urology;  Laterality: Bilateral;  . CYSTOSCOPY WITH STENT PLACEMENT Bilateral 01/25/2018   Procedure: CYSTOSCOPY WITH STENT PLACEMENT;  [name], MD;  [name];  Service: Urology;  Laterality: Bilateral;  . EYE SURGERY     Cornea transplants bilaterally & cataract surgery.  . TRANSURETHRAL  RESECTION OF BLADDER TUMOR N/A 01/25/2018   Procedure: TRANSURETHRAL RESECTION OF BLADDER TUMOR (TURBT);  [name], MD;  [name];  Service: Urology;  [name];    Home Medications:  Allergies as of 12/26/2018   No Known Allergies     Medication List       Accurate as of December 26, 2018  2:13 PM. If you have any questions, ask your Cummings or doctor.        amLODipine 5 MG tablet Commonly known as: NORVASC Take 1 tablet (5 mg total) by mouth daily.   aspirin EC 81 MG tablet Take 81 mg by mouth daily.   diphenhydrAMINE 25 MG tablet Commonly known as: BENADRYL Take 25 mg by mouth daily as needed (ALLERGIES).   docusate sodium 100 MG capsule Commonly known as: COLACE Take 2 capsules (200 mg total) by mouth 2 (two) times daily as needed for mild constipation.   HYDROcodone-acetaminophen 5-325 MG tablet Commonly known as: NORCO/VICODIN Take 1 tablet by mouth every 8 (eight) hours as needed for moderate pain.   levothyroxine 88 MCG tablet Commonly known as: Synthroid Take 1 tablet (88 mcg total) by mouth daily before breakfast. Empty stomach- 1 hour prior to breakfast.   multivitamin with minerals Tabs tablet Take 1 tablet by mouth daily. One-A-Day   oxybutynin 5 MG tablet Commonly known as: DITROPAN 1 tab tid prn frequency,urgency, bladder spasm What changed:   how much to take  how to take this  when to take this  additional instructions   prednisoLONE acetate 1 % ophthalmic suspension  Commonly known as: PRED FORTE Place 1 drop into both eyes daily.   tamsulosin 0.4 MG Caps capsule Commonly known as: FLOMAX Take 1 capsule (0.4 mg total) by mouth at bedtime.       Allergies: No Known Allergies  Family History: Family History  Problem Relation Age of Onset  . Prostate cancer Neg Hx   . Kidney cancer Neg Hx   . Bladder Cancer Neg Hx     Social History:  reports that he has quit smoking. His smokeless tobacco use includes  chew. He reports current alcohol use of about 2.0 standard drinks of alcohol per week. He reports that he does not use drugs.  ROS: UROLOGY Frequent Urination?: No Hard to postpone urination?: No Burning/pain with urination?: No Get up at night to urinate?: No Leakage of urine?: No Urine stream starts and stops?: No Trouble starting stream?: No Do you have to strain to urinate?: No Blood in urine?: No Urinary tract infection?: No Sexually transmitted disease?: No Injury to kidneys or bladder?: No Painful intercourse?: No Weak stream?: No Erection problems?: No Penile pain?: No  Gastrointestinal Nausea?: No Vomiting?: No Indigestion/heartburn?: No Diarrhea?: No Constipation?: No  Constitutional Fever: No Night sweats?: No Weight loss?: No Fatigue?: No  Skin Skin rash/lesions?: No Itching?: No  Eyes Blurred vision?: No Double vision?: No  Ears/Nose/Throat Sore throat?: No Sinus problems?: No  Hematologic/Lymphatic Swollen glands?: No Easy bruising?: No  Cardiovascular Leg swelling?: No Chest pain?: No  Respiratory Cough?: No Shortness of breath?: No  Endocrine Excessive thirst?: No  Musculoskeletal Back pain?: No Joint pain?: No  Neurological Headaches?: No Dizziness?: No  Psychologic Depression?: No Anxiety?: No  Physical Exam: BP (!) 158/68   Pulse 87   Ht 5\' 9"  (1.753 m)   Wt 127 lb (57.6 kg)   BMI 18.75 kg/m   Constitutional:  Alert and oriented, No acute distress. HEENT: [place] AT, moist mucus membranes.  Trachea midline, no masses. Cardiovascular: No clubbing, cyanosis, or edema.RRR Respiratory: Normal respiratory effort, no increased work of breathing.  Clear GI: Abdomen is soft, nontender, nondistended, no abdominal masses GU: Phallus uncircumcised with inability to retract foreskin.  Meatus not visualized due to significant phimosis. Lymph: No cervical or inguinal lymphadenopathy. Skin: No rashes, bruises or suspicious lesions.  Neurologic: Grossly intact, no focal deficits, moving all 4 extremities. Psychiatric: Normal mood and affect.   Assessment & Plan:   85 year old male with metastatic urothelial carcinoma the bladder on palliative therapy.  He does have bilateral hydronephrosis and his stents have been indwelling for 11 months.  I recommended scheduling cystoscopy with bilateral retrograde pyelograms and stent exchange.  We discussed options for his phimosis including circumcision versus dorsal slit.  He has elected dorsal slit and will perform under the same anesthesia.  The procedures were discussed in detail occluding potential risks of bleeding, infection/sepsis as well as anesthetic risk.  He indicated all questions were answered and desires to proceed.   Abbie Sons, [place] [address], [place], [place] [address] [phone]

## 2018-12-30 ENCOUNTER — Other Ambulatory Visit: Payer: Medicare HMO

## 2018-12-30 ENCOUNTER — Other Ambulatory Visit: Payer: Self-pay

## 2018-12-30 DIAGNOSIS — Z01818 Encounter for other preprocedural examination: Secondary | ICD-10-CM | POA: Diagnosis not present

## 2018-12-30 DIAGNOSIS — C679 Malignant neoplasm of bladder, unspecified: Secondary | ICD-10-CM | POA: Diagnosis not present

## 2018-12-30 DIAGNOSIS — T191XXA Foreign body in bladder, initial encounter: Secondary | ICD-10-CM | POA: Diagnosis not present

## 2018-12-30 DIAGNOSIS — N3289 Other specified disorders of bladder: Secondary | ICD-10-CM

## 2018-12-31 ENCOUNTER — Encounter: Payer: Self-pay | Admitting: Urology

## 2019-01-01 LAB — CULTURE, URINE COMPREHENSIVE

## 2019-01-02 ENCOUNTER — Other Ambulatory Visit: Admission: RE | Admit: 2019-01-02 | Payer: Medicare HMO | Source: Ambulatory Visit

## 2019-01-02 ENCOUNTER — Other Ambulatory Visit: Payer: Self-pay | Admitting: Radiology

## 2019-01-02 ENCOUNTER — Other Ambulatory Visit: Payer: Self-pay

## 2019-01-02 ENCOUNTER — Encounter
Admission: RE | Admit: 2019-01-02 | Discharge: 2019-01-02 | Disposition: A | Discharge status: Home / Self Care | Payer: Medicare HMO | Source: Ambulatory Visit | Attending: Urology | Admitting: Urology

## 2019-01-02 DIAGNOSIS — R001 Bradycardia, unspecified: Secondary | ICD-10-CM | POA: Diagnosis not present

## 2019-01-02 DIAGNOSIS — Z01818 Encounter for other preprocedural examination: Secondary | ICD-10-CM | POA: Diagnosis not present

## 2019-01-02 DIAGNOSIS — N189 Chronic kidney disease, unspecified: Secondary | ICD-10-CM | POA: Insufficient documentation

## 2019-01-02 DIAGNOSIS — C679 Malignant neoplasm of bladder, unspecified: Secondary | ICD-10-CM | POA: Insufficient documentation

## 2019-01-02 DIAGNOSIS — Z87891 Personal history of nicotine dependence: Secondary | ICD-10-CM | POA: Insufficient documentation

## 2019-01-02 DIAGNOSIS — Z1159 Encounter for screening for other viral diseases: Secondary | ICD-10-CM | POA: Insufficient documentation

## 2019-01-02 DIAGNOSIS — N471 Phimosis: Secondary | ICD-10-CM | POA: Insufficient documentation

## 2019-01-02 DIAGNOSIS — I10 Essential (primary) hypertension: Secondary | ICD-10-CM | POA: Diagnosis not present

## 2019-01-02 DIAGNOSIS — I129 Hypertensive chronic kidney disease with stage 1 through stage 4 chronic kidney disease, or unspecified chronic kidney disease: Secondary | ICD-10-CM | POA: Diagnosis not present

## 2019-01-02 LAB — SARS CORONAVIRUS 2 (TAT 6-24 HRS): SARS Coronavirus 2: NEGATIVE

## 2019-01-02 NOTE — Patient Instructions (Signed)
Your procedure is scheduled on: Monday 01/06/19 Report to [facility]. To find out your arrival time please call [phone] between 1PM - 3PM on Friday 01/03/19.  Remember: Instructions that are not followed completely may result in serious medical risk, up to and including death, or upon the discretion of your surgeon and anesthesiologist your surgery may need to be rescheduled.     _X__ 1. Do not eat food after midnight the night before your procedure.                 No gum chewing or hard candies. You may drink clear liquids up to 2 hours                 before you are scheduled to arrive for your surgery- DO not drink clear                 liquids within 2 hours of the start of your surgery.                 Clear Liquids include:  water, apple juice without pulp, clear carbohydrate                 drink such as Clearfast or Gatorade, Black Coffee or Tea (Do not add                 anything to coffee or tea). Diabetics water only  __X__2.  On the morning of surgery brush your teeth with toothpaste and water, you                 may rinse your mouth with mouthwash if you wish.  Do not swallow any              toothpaste of mouthwash.     _X__ 3.  No Alcohol for 24 hours before or after surgery.   _X__ 4.  Do Not Smoke or use e-cigarettes For 24 Hours Prior to Your Surgery.                 Do not use any chewable tobacco products for at least 6 hours prior to                 surgery.  ____  5.  Bring all medications with you on the day of surgery if instructed.   __X__  6.  Notify your doctor if there is any change in your medical condition      (cold, fever, infections).     Do not wear jewelry, make-up, hairpins, clips or nail polish. Do not wear lotions, powders, or perfumes.  Do not shave 48 hours prior to surgery. Men may shave face and neck. Do not bring valuables to the hospital.    [facility] is not responsible for any  belongings or valuables.  Contacts, dentures/partials or body piercings may not be worn into surgery. Bring a case for your contacts, glasses or hearing aids, a denture cup will be supplied. Leave your suitcase in the car. After surgery it may be brought to your room. For patients admitted to the hospital, discharge time is determined by your treatment team.   Patients discharged the day of surgery will not be allowed to drive home.   Please read over the following fact sheets that you were given:   MRSA Information  __X__ Take these medicines the morning of surgery with A SIP OF WATER:  1. amLODipine (NORVASC  2. fexofenadine (ALLEGRA)   3. levothyroxine (SYNTHROID  4. oxybutynin (DITROPAN  5.  6.  ____ Fleet Enema (as directed)   ____ Use CHG Soap/SAGE wipes as directed  ____ Use inhalers on the day of surgery  ____ Stop metformin/Janumet/Farxiga 2 days prior to surgery    ____ Take 1/2 of usual insulin dose the night before surgery. No insulin the morning          of surgery.   ____ Stop Blood Thinners Coumadin/Plavix/Xarelto/Pleta/Pradaxa/Eliquis/Effient/Aspirin  on   Or contact your Surgeon, Cardiologist or Medical Doctor regarding  ability to stop your blood thinners  __X__ Stop Anti-inflammatories 7 days before surgery such as Advil, Ibuprofen, Motrin,  BC or Goodies Powder, Naprosyn, Naproxen, Aleve, Aspirin    __X__ Stop all herbal supplements, fish oil or vitamin E until after surgery.    ____ Bring C-Pap to the hospital.

## 2019-01-05 MED ORDER — CEFAZOLIN SODIUM-DEXTROSE 2-4 GM/100ML-% IV SOLN
2.0000 g | INTRAVENOUS | Status: AC
  Administered 2019-01-06: 2 g via INTRAVENOUS

## 2019-01-06 ENCOUNTER — Ambulatory Visit: Payer: Medicare HMO | Admitting: Certified Registered Nurse Anesthetist

## 2019-01-06 ENCOUNTER — Ambulatory Visit
Admission: RE | Admit: 2019-01-06 | Discharge: 2019-01-06 | Disposition: A | Discharge status: Home / Self Care | Payer: Medicare HMO | Attending: Urology | Admitting: Urology

## 2019-01-06 ENCOUNTER — Other Ambulatory Visit: Payer: Self-pay

## 2019-01-06 ENCOUNTER — Ambulatory Visit: Payer: Medicare HMO

## 2019-01-06 ENCOUNTER — Encounter: Payer: Self-pay | Admitting: *Deleted

## 2019-01-06 ENCOUNTER — Encounter
Admission: RE | Disposition: A | Discharge status: Home / Self Care | Payer: Self-pay | Source: Home / Self Care | Attending: Urology

## 2019-01-06 DIAGNOSIS — C679 Malignant neoplasm of bladder, unspecified: Secondary | ICD-10-CM

## 2019-01-06 DIAGNOSIS — C678 Malignant neoplasm of overlapping sites of bladder: Secondary | ICD-10-CM | POA: Diagnosis not present

## 2019-01-06 DIAGNOSIS — C772 Secondary and unspecified malignant neoplasm of intra-abdominal lymph nodes: Secondary | ICD-10-CM | POA: Diagnosis not present

## 2019-01-06 DIAGNOSIS — Z7982 Long term (current) use of aspirin: Secondary | ICD-10-CM | POA: Insufficient documentation

## 2019-01-06 DIAGNOSIS — Z466 Encounter for fitting and adjustment of urinary device: Secondary | ICD-10-CM | POA: Diagnosis not present

## 2019-01-06 DIAGNOSIS — I129 Hypertensive chronic kidney disease with stage 1 through stage 4 chronic kidney disease, or unspecified chronic kidney disease: Secondary | ICD-10-CM | POA: Diagnosis not present

## 2019-01-06 DIAGNOSIS — Z20828 Contact with and (suspected) exposure to other viral communicable diseases: Secondary | ICD-10-CM | POA: Diagnosis not present

## 2019-01-06 DIAGNOSIS — N1330 Unspecified hydronephrosis: Secondary | ICD-10-CM | POA: Insufficient documentation

## 2019-01-06 DIAGNOSIS — Z8551 Personal history of malignant neoplasm of bladder: Secondary | ICD-10-CM | POA: Insufficient documentation

## 2019-01-06 DIAGNOSIS — Z87891 Personal history of nicotine dependence: Secondary | ICD-10-CM | POA: Insufficient documentation

## 2019-01-06 DIAGNOSIS — N189 Chronic kidney disease, unspecified: Secondary | ICD-10-CM | POA: Insufficient documentation

## 2019-01-06 DIAGNOSIS — N183 Chronic kidney disease, stage 3 (moderate): Secondary | ICD-10-CM | POA: Diagnosis not present

## 2019-01-06 DIAGNOSIS — N471 Phimosis: Secondary | ICD-10-CM | POA: Insufficient documentation

## 2019-01-06 DIAGNOSIS — Z79890 Hormone replacement therapy (postmenopausal): Secondary | ICD-10-CM | POA: Diagnosis not present

## 2019-01-06 DIAGNOSIS — Z79899 Other long term (current) drug therapy: Secondary | ICD-10-CM | POA: Diagnosis not present

## 2019-01-06 HISTORY — PX: DORSAL SLIT: SHX6822

## 2019-01-06 HISTORY — PX: CYSTOSCOPY W/ URETERAL STENT PLACEMENT: SHX1429

## 2019-01-06 SURGERY — DORSAL SLIT, PREPUCE
Anesthesia: General

## 2019-01-06 MED ORDER — IOPAMIDOL (ISOVUE-200) INJECTION 41%
INTRAVENOUS | Status: DC | PRN
  Administered 2019-01-06: 10:00:00 25 mL

## 2019-01-06 MED ORDER — BACITRACIN ZINC 500 UNIT/GM EX OINT
TOPICAL_OINTMENT | CUTANEOUS | Status: AC
  Filled 2019-01-06: qty 28.35

## 2019-01-06 MED ORDER — FAMOTIDINE 20 MG PO TABS
20.0000 mg | ORAL_TABLET | Freq: Once | ORAL | Status: AC
  Administered 2019-01-06: 20 mg via ORAL

## 2019-01-06 MED ORDER — CEFAZOLIN SODIUM-DEXTROSE 2-4 GM/100ML-% IV SOLN
INTRAVENOUS | Status: AC
  Filled 2019-01-06: qty 100

## 2019-01-06 MED ORDER — SUGAMMADEX SODIUM 500 MG/5ML IV SOLN
INTRAVENOUS | Status: DC | PRN
  Administered 2019-01-06: 115.2 mg via INTRAVENOUS

## 2019-01-06 MED ORDER — ONDANSETRON HCL 4 MG/2ML IJ SOLN
INTRAMUSCULAR | Status: AC
  Filled 2019-01-06: qty 2

## 2019-01-06 MED ORDER — ONDANSETRON HCL 4 MG/2ML IJ SOLN: 4.0000 mg | Freq: Once | INTRAMUSCULAR | Status: DC | PRN

## 2019-01-06 MED ORDER — BUPIVACAINE HCL (PF) 0.25 % IJ SOLN
INTRAMUSCULAR | Status: AC
  Filled 2019-01-06: qty 30

## 2019-01-06 MED ORDER — ROCURONIUM BROMIDE 100 MG/10ML IV SOLN
INTRAVENOUS | Status: DC | PRN
  Administered 2019-01-06: 5 mg via INTRAVENOUS
  Administered 2019-01-06: 25 mg via INTRAVENOUS
  Administered 2019-01-06: 10 mg via INTRAVENOUS

## 2019-01-06 MED ORDER — SUCCINYLCHOLINE CHLORIDE 20 MG/ML IJ SOLN
INTRAMUSCULAR | Status: DC | PRN
  Administered 2019-01-06: 100 mg via INTRAVENOUS

## 2019-01-06 MED ORDER — DEXAMETHASONE SODIUM PHOSPHATE 10 MG/ML IJ SOLN
INTRAMUSCULAR | Status: DC | PRN
  Administered 2019-01-06: 5 mg via INTRAVENOUS

## 2019-01-06 MED ORDER — LACTATED RINGERS IV SOLN
INTRAVENOUS | Status: DC
  Administered 2019-01-06: 75 mL/h via INTRAVENOUS

## 2019-01-06 MED ORDER — DEXAMETHASONE SODIUM PHOSPHATE 10 MG/ML IJ SOLN
INTRAMUSCULAR | Status: AC
  Filled 2019-01-06: qty 1

## 2019-01-06 MED ORDER — BUPIVACAINE HCL 0.25 % IJ SOLN
INTRAMUSCULAR | Status: DC | PRN
  Administered 2019-01-06: 2 mL

## 2019-01-06 MED ORDER — LIDOCAINE HCL (CARDIAC) PF 100 MG/5ML IV SOSY
PREFILLED_SYRINGE | INTRAVENOUS | Status: DC | PRN
  Administered 2019-01-06: 60 mg via INTRAVENOUS

## 2019-01-06 MED ORDER — FENTANYL CITRATE (PF) 100 MCG/2ML IJ SOLN
INTRAMUSCULAR | Status: DC | PRN
  Administered 2019-01-06: 50 ug via INTRAVENOUS
  Administered 2019-01-06 (×2): 25 ug via INTRAVENOUS

## 2019-01-06 MED ORDER — PROPOFOL 10 MG/ML IV BOLUS
INTRAVENOUS | Status: DC | PRN
  Administered 2019-01-06: 110 mg via INTRAVENOUS

## 2019-01-06 MED ORDER — HYDROCODONE-ACETAMINOPHEN 5-325 MG PO TABS: 1.0000 | ORAL_TABLET | Freq: Four times a day (QID) | ORAL | 0 refills | Status: DC | PRN

## 2019-01-06 MED ORDER — GLYCOPYRROLATE 0.2 MG/ML IJ SOLN
INTRAMUSCULAR | Status: DC | PRN
  Administered 2019-01-06: 0.2 mg via INTRAVENOUS

## 2019-01-06 MED ORDER — PHENYLEPHRINE HCL (PRESSORS) 10 MG/ML IV SOLN
INTRAVENOUS | Status: DC | PRN
  Administered 2019-01-06: 100 ug via INTRAVENOUS
  Administered 2019-01-06 (×2): 50 ug via INTRAVENOUS
  Administered 2019-01-06: 100 ug via INTRAVENOUS

## 2019-01-06 MED ORDER — FENTANYL CITRATE (PF) 100 MCG/2ML IJ SOLN: 25.0000 ug | INTRAMUSCULAR | Status: DC | PRN

## 2019-01-06 MED ORDER — SUGAMMADEX SODIUM 200 MG/2ML IV SOLN
INTRAVENOUS | Status: AC
  Filled 2019-01-06: qty 2

## 2019-01-06 MED ORDER — FENTANYL CITRATE (PF) 100 MCG/2ML IJ SOLN
INTRAMUSCULAR | Status: AC
  Filled 2019-01-06: qty 2

## 2019-01-06 MED ORDER — LIDOCAINE HCL (PF) 2 % IJ SOLN
INTRAMUSCULAR | Status: AC
  Filled 2019-01-06: qty 10

## 2019-01-06 MED ORDER — FAMOTIDINE 20 MG PO TABS
ORAL_TABLET | ORAL | Status: AC
  Administered 2019-01-06: 20 mg via ORAL
  Filled 2019-01-06: qty 1

## 2019-01-06 SURGICAL SUPPLY — 60 items
APL PRP STRL LF DISP 70% ISPRP (MISCELLANEOUS)
BAG DRAIN CYSTO-URO LG1000N (MISCELLANEOUS) ×3 IMPLANT
BAG URINE DRAINAGE (UROLOGICAL SUPPLIES) ×2 IMPLANT
BLADE CLIPPER SURG (BLADE) ×2 IMPLANT
BLADE SURG 15 STRL LF DISP TIS (BLADE) ×2 IMPLANT
BLADE SURG 15 STRL SS (BLADE) ×3
BNDG COHESIVE 1X5 TAN NS LF (GAUZE/BANDAGES/DRESSINGS) IMPLANT
BNDG CONFORM 2 STRL LF (GAUZE/BANDAGES/DRESSINGS) ×3 IMPLANT
BRUSH SCRUB EZ  4% CHG (MISCELLANEOUS)
BRUSH SCRUB EZ 4% CHG (MISCELLANEOUS) IMPLANT
CANISTER SUCT 1200ML W/VALVE (MISCELLANEOUS) ×3 IMPLANT
CATH FOLEY 2WAY  5CC 16FR (CATHETERS)
CATH FOLEY 2WAY 5CC 16FR (CATHETERS)
CATH URETL 5X70 OPEN END (CATHETERS) ×2 IMPLANT
CATH URTH 16FR FL 2W BLN LF (CATHETERS) IMPLANT
CHLORAPREP W/TINT 26 (MISCELLANEOUS) ×2 IMPLANT
COVER WAND RF STERILE (DRAPES) ×2 IMPLANT
DRAPE LAPAROTOMY 77X122 PED (DRAPES) ×3 IMPLANT
DRAPE UTILITY 15X26 TOWEL STRL (DRAPES) ×3 IMPLANT
DRSG TELFA 4X3 1S NADH ST (GAUZE/BANDAGES/DRESSINGS) ×2 IMPLANT
ELECT LOOP 22F BIPOLAR SML (ELECTROSURGICAL)
ELECT REM PT RETURN 9FT ADLT (ELECTROSURGICAL) ×3
ELECTRODE LOOP 22F BIPOLAR SML (ELECTROSURGICAL) IMPLANT
ELECTRODE REM PT RTRN 9FT ADLT (ELECTROSURGICAL) ×2 IMPLANT
GAUZE PETROLATUM 1 X8 (GAUZE/BANDAGES/DRESSINGS) ×3 IMPLANT
GLOVE BIO SURGEON STRL SZ8 (GLOVE) ×4 IMPLANT
GLOVE BIOGEL M 8.0 STRL (GLOVE) ×2 IMPLANT
GOWN STRL REUS W/ TWL LRG LVL3 (GOWN DISPOSABLE) ×2 IMPLANT
GOWN STRL REUS W/ TWL XL LVL3 (GOWN DISPOSABLE) ×2 IMPLANT
GOWN STRL REUS W/TWL LRG LVL3 (GOWN DISPOSABLE) ×3
GOWN STRL REUS W/TWL XL LVL3 (GOWN DISPOSABLE) ×3
GUIDEWIRE STR DUAL SENSOR (WIRE) ×3 IMPLANT
KIT TURNOVER CYSTO (KITS) ×3 IMPLANT
KIT TURNOVER KIT A (KITS) ×3 IMPLANT
LABEL OR SOLS (LABEL) ×2 IMPLANT
LOOP CUT BIPOLAR 24F LRG (ELECTROSURGICAL) IMPLANT
NDL HYPO 25X1 1.5 SAFETY (NEEDLE) ×2 IMPLANT
NEEDLE HYPO 25X1 1.5 SAFETY (NEEDLE) ×3 IMPLANT
NS IRRIG 500ML POUR BTL (IV SOLUTION) ×2 IMPLANT
PACK BASIN MINOR ARMC (MISCELLANEOUS) ×2 IMPLANT
PACK CYSTO AR (MISCELLANEOUS) ×3 IMPLANT
PREP PVP WINGED SPONGE (MISCELLANEOUS) ×1 IMPLANT
SET CYSTO W/LG BORE CLAMP LF (SET/KITS/TRAYS/PACK) ×2 IMPLANT
SET IRRIG Y TYPE TUR BLADDER L (SET/KITS/TRAYS/PACK) ×3 IMPLANT
SOL .9 NS 3000ML IRR  AL (IV SOLUTION) ×1
SOL .9 NS 3000ML IRR AL (IV SOLUTION) ×2
SOL .9 NS 3000ML IRR UROMATIC (IV SOLUTION) ×2 IMPLANT
SOL PREP PVP 2OZ (MISCELLANEOUS) ×3
SOLUTION PREP PVP 2OZ (MISCELLANEOUS) ×2 IMPLANT
STENT URET 6FRX24 CONTOUR (STENTS) IMPLANT
STENT URET 6FRX26 CONTOUR (STENTS) IMPLANT
STRETCH NET 2 107126 (MISCELLANEOUS) ×2 IMPLANT
SURGILUBE 2OZ TUBE FLIPTOP (MISCELLANEOUS) ×3 IMPLANT
SUT CHROMIC 3 0 SH 27 (SUTURE) ×2 IMPLANT
SUT CHROMIC 4 0 RB 1X27 (SUTURE) ×2 IMPLANT
SUT CHROMIC 4 0 SH 27 (SUTURE) IMPLANT
SYR 10ML LL (SYRINGE) ×3 IMPLANT
SYRINGE IRR TOOMEY STRL 70CC (SYRINGE) ×3 IMPLANT
WATER STERILE IRR 1000ML POUR (IV SOLUTION) ×3 IMPLANT
WATER STERILE IRR 3000ML UROMA (IV SOLUTION) ×2 IMPLANT

## 2019-01-06 NOTE — Anesthesia Post-op Follow-up Note (Signed)
Anesthesia QCDR form completed.        

## 2019-01-06 NOTE — Discharge Instructions (Signed)
AMBULATORY SURGERY  DISCHARGE INSTRUCTIONS   1) The drugs that you were given will stay in your system until tomorrow so for the next 24 hours you should not:  A) Drive an automobile B) Make any legal decisions C) Drink any alcoholic beverage   2) You may resume regular meals tomorrow.  Today it is better to start with liquids and gradually work up to solid foods.  You may eat anything you prefer, but it is better to start with liquids, then soup and crackers, and gradually work up to solid foods.   3) Please notify your doctor immediately if you have any unusual bleeding, trouble breathing, redness and pain at the surgery site, drainage, fever, or pain not relieved by medication.    4) Additional Instructions:        Please contact your physician with any problems or Same Day Surgery at [phone], Monday through Friday 6 am to 4 pm, or  at [facility] number at [phone].Postoperative instructions for dorsal slit  Wound:  In most cases your incision will have absorbable sutures that run along the course of your incision and will dissolve within the first 10-20 days. Some will fall out even earlier. Expect some redness as the sutures dissolved but this should occur only around the sutures. If there is generalized redness, especially with increasing pain or swelling, let us know.  Bruising is common to see.  Diet:  You may return to your normal diet within 24 hours following your surgery. You may note some mild nausea and possibly vomiting the first 6-8 hours following surgery. This is usually due to the side effects of anesthesia, and will disappear quite soon. I would suggest clear liquids and a very light meal the first evening following your surgery.  Activity:  Your physical activity should be restricted the first 48 hours. During that time you should remain relatively inactive, moving about only when necessary. During the first 7-10 days following  surgery he should avoid lifting any heavy objects (anything greater than 15 pounds), and avoid strenuous exercise. If you work, ask [place] specifically about your restrictions, both for work and home. We will write a note to your employer if needed.  Ice packs can be placed on and off over the penis for the first 48 hours to help relieve the pain and keep the swelling down. Frozen peas or corn in a ZipLock bag can be frozen, used and re-frozen. Fifteen minutes on and 15 minutes off is a reasonable schedule.   Dressing:  Your dressing consist of 3 layers: An inner layer of Vaseline gauze around the incision, a gauze dressing around the penis and a mesh dressing to hold in place.  You may remove this dressing in 48 hours.  Remove earlier if it gets saturated with urine or falls over the head of the penis.  If the Vaseline gauze sticks to the incision you may remove in the shower.  Hygiene:  You may shower 48 hours after your surgery. Tub bathing should be restricted until the seventh day.  Medication:  You will be sent home with some type of pain medication. In many cases you will be sent home with a narcotic pain pill (Vicodin or Tylox). If the pain is not too bad, you may take either Tylenol (acetaminophen) or Advil (ibuprofen) which contain no narcotic agents, and might be tolerated a little better, with fewer side effects. If the pain medication you are sent home with does not control the  pain, you will have to let us know. Some narcotic pain medications cannot be given or refilled by a phone call to a pharmacy.  Problems you should report to [place]:   Fever of 101.0 degrees Fahrenheit or greater.  Moderate or severe swelling under the skin incision or involving the scrotum.  Drug reaction such as hives, a rash, nausea or vomiting.   Follow-up: You will be contacted by our office for a postop follow-up appointment in 4-6 weeks.

## 2019-01-06 NOTE — Transfer of Care (Signed)
Immediate Anesthesia Transfer of Care Note  Patient: Edward Cummings  Procedure(s) Performed: DORSAL SLIT (N/A ) CYSTOSCOPY WITH RETROGRADE PYELOGRAM/URETERAL STENT REMOVAL (Bilateral )  Patient Location: PACU  Anesthesia Type:General  Level of Consciousness: awake, alert  and oriented  Airway & Oxygen Therapy: Patient Spontanous Breathing and Patient connected to face mask oxygen  Post-op Assessment: Report given to RN and Post -op Vital signs reviewed and stable  Post vital signs: Reviewed and stable  Last Vitals:  Vitals Value Taken Time  BP    Temp    Pulse 63 01/06/19 1033  Resp 9 01/06/19 1033  SpO2 100 % 01/06/19 1033  Vitals shown include unvalidated device data.  Last Pain:  Vitals:   01/06/19 0803  TempSrc: Temporal  PainSc: 0-No pain      Patients Stated Pain Goal: 0 (56/38/75 6433)  Complications: No apparent anesthesia complications

## 2019-01-06 NOTE — Anesthesia Procedure Notes (Signed)
Procedure Name: Intubation Performed by: Demetrius Charity, CRNA Pre-anesthesia Checklist: Patient identified, Patient being monitored, Timeout performed, Emergency Drugs available and Suction available Patient Re-evaluated:Patient Re-evaluated prior to induction Oxygen Delivery Method: Circle system utilized Preoxygenation: Pre-oxygenation with 100% oxygen Induction Type: IV induction Ventilation: Mask ventilation without difficulty Laryngoscope Size: Mac and 3 Grade View: Grade I Tube type: Oral Tube size: 7.0 mm Number of attempts: 1 Airway Equipment and Method: Stylet Placement Confirmation: ETT inserted through vocal cords under direct vision,  positive ETCO2 and breath sounds checked- equal and bilateral Secured at: 24 cm Tube secured with: Tape Dental Injury: Teeth and Oropharynx as per pre-operative assessment

## 2019-01-06 NOTE — Interval H&P Note (Signed)
History and Physical Interval Note:  01/06/2019 8:49 AM  Edward Cummings  has presented today for surgery, with the diagnosis of PHIMOSIS,BLADDER CANCER.  The various methods of treatment have been discussed with the patient and family. After consideration of risks, benefits and other options for treatment, the patient has consented to  Procedure(s): DORSAL SLIT (N/A) CYSTOSCOPY (N/A) TRANSURETHRAL RESECTION OF BLADDER TUMOR (TURBT) (N/A) CYSTOSCOPY WITH STENT REPLACEMENT (Bilateral) as a surgical intervention.  The patient's history has been reviewed, patient examined, no change in status, stable for surgery.  I have reviewed the patient's chart and labs.  Questions were answered to the patient's satisfaction.     [place]

## 2019-01-06 NOTE — Anesthesia Postprocedure Evaluation (Signed)
Anesthesia Post Note  Patient: Edward Cummings  Procedure(s) Performed: DORSAL SLIT (N/A ) CYSTOSCOPY WITH RETROGRADE PYELOGRAM/URETERAL STENT REMOVAL (Bilateral )  Patient location during evaluation: PACU Anesthesia Type: General Level of consciousness: awake and alert and oriented Pain management: pain level controlled Vital Signs Assessment: post-procedure vital signs reviewed and stable Respiratory status: spontaneous breathing, nonlabored ventilation and respiratory function stable Cardiovascular status: blood pressure returned to baseline and stable Postop Assessment: no signs of nausea or vomiting Anesthetic complications: no     Last Vitals:  Vitals:   01/06/19 1120 01/06/19 1121  BP:  (!) 140/54  Pulse: (!) 57 65  Resp: 15 20  Temp:  (!) 36.1 C  SpO2: 100% 99%    Last Pain:  Vitals:   01/06/19 1121  TempSrc: Temporal  PainSc: 0-No pain                 Hason Ofarrell

## 2019-01-06 NOTE — Op Note (Signed)
Preoperative diagnosis:  1. Metastatic urothelial carcinoma the bladder 2. Phimosis  Postoperative diagnosis:  1. Metastatic urothelial carcinoma the bladder 2. Phimosis  Procedure: 1. Cystoscopy 2. Bilateral retrograde pyelograms 3. Bilateral ureteral stent removal 4. Dorsal slit  [name], MD  Anesthesia: General  Complications: None  Intraoperative findings:  1.  Right retrograde pyelogram-collecting system was normal in appearance without hydronephrosis.  There was mild dilation of the right distal ureter.  The system completely emptied on real-time fluoroscopy after approximately 3 minutes  2.  Left retrograde pyelogram-mild dilation of the renal pelvis however prompt emptying noted on real-time fluoroscopy.  Mild dilation of the distal ureter with emptying noted on fluoroscopy.  EBL: Minimal  Specimens: None  Indication: Edward Cummings is a 85 y.o. patient with a history of urothelial carcinoma the bladder status post TURBT 01/25/2018 for a 4 cm bladder mass and bilateral hydronephrosis.  He was noted to have moderate right hydronephrosis and left hydronephrosis and had bilateral stents placed.  He was subsequently found to have metastatic disease to the retroperitoneal lymph nodes and bulky retroperitoneal adenopathy as well as mediastinal uptake and uptake in the right pubic rami.  He has been undergoing palliative treatment with Tecentric in oncology.  He presented complaining of phimosis however had not had urology follow-up since his TURBT postop appointment.  After reviewing the management options for treatment, he elected to proceed with the above surgical procedure(s). We have discussed the potential benefits and risks of the procedure, side effects of the proposed treatment, the likelihood of the patient achieving the goals of the procedure, and any potential problems that might occur during the procedure or recuperation. Informed consent has been  obtained.  Description of procedure:  The patient was taken to the operating room and general anesthesia was induced.  The patient was placed in the dorsal lithotomy position, prepped and draped in the usual sterile fashion, and preoperative antibiotics were administered. A preoperative time-out was performed.   The prepuce was unretractable and there was a pinpoint preputial opening which was dilated with a hemostat.  A dorsal penile block was performed with 0.25% plain Sensorcaine.  The dorsal prepuce was clamped with a straight hemostat and divided with scissors back to the corona radiata.  Hemostasis was obtained with cautery.  The skin edges were then reapproximated bilaterally with a running 3-0 Monocryl suture.  A 21 [place] scope was then lubricated and passed under direct vision.  The urethra was normal in caliber without stricture.  Prostate was remarkable for mild to moderate lateral lobe enlargement and moderate bladder neck elevation.  The stents were present bilaterally and not encrusted.  The bladder mucosa was closely inspected and with the exception of mild inflammatory changes secondary to indwelling stent no tumor was identified.  A 0.038 sensor wire was placed through the cystoscope and into the right ureteral orifice and advanced proximally to the renal pelvis under fluoroscopic guidance.  The cystoscope was repassed and the right ureteral stent was grasped with endoscopic forceps and removed.  A 5 French open-ended ureteral catheter was then placed over the guidewire and the guidewire was removed.  A pullout retrograde pyelogram was performed with findings as described above.  Based on prompt drainage of contrast it was elected not to replace the stent.  Attention was directed to the left ureteral orifice where an identical procedure was performed.  Retrograde pyelogram findings as above.  The stent was not replaced.  The dorsal slit site was  dressed with petrolatum gauze, [place]  and stretch net.  After anesthetic reversal patient was transported to the PACU in stable condition.   Abbie Sons, M.D.

## 2019-01-06 NOTE — Anesthesia Preprocedure Evaluation (Addendum)
Anesthesia Evaluation  Patient identified by MRN, date of birth, ID band Patient awake    Reviewed: Allergy & Precautions, NPO status , Patient's Chart, lab work & pertinent test results  Airway Mallampati: II  TM Distance: >3 FB     Dental  (+) Edentulous Upper, Edentulous Lower   Pulmonary former smoker,    Pulmonary exam normal        Cardiovascular hypertension, Normal cardiovascular exam     Neuro/Psych PSYCHIATRIC DISORDERS Depression  Neuromuscular disease    GI/Hepatic negative GI ROS, Neg liver ROS,   Endo/Other  negative endocrine ROS  Renal/GU Renal InsufficiencyRenal disease     Musculoskeletal negative musculoskeletal ROS (+)   Abdominal Normal abdominal exam  (+)   Peds negative pediatric ROS (+)  Hematology  (+) anemia ,   Anesthesia Other Findings Past Medical History: No date: Anemia No date: Cancer ([place])     Comment:  bladder No date: Chronic kidney disease No date: Depression No date: Hypertension No date: Neuromuscular disorder ([place])     Comment:  Nerve damage to left face/eye since around 2002.  Reproductive/Obstetrics                             Anesthesia Physical  Anesthesia Plan  ASA: III  Anesthesia Plan: General   Post-op Pain Management:    Induction: Intravenous  PONV Risk Score and Plan:   Airway Management Planned: Oral ETT  Additional Equipment:   Intra-op Plan:   Post-operative Plan: Extubation in OR  Informed Consent: I have reviewed the patients History and Physical, chart, labs and discussed the procedure including the risks, benefits and alternatives for the proposed anesthesia with the patient or authorized representative who has indicated his/her understanding and acceptance.     Dental advisory given  Plan Discussed with: CRNA and Surgeon  Anesthesia Plan Comments:         Anesthesia Quick Evaluation

## 2019-01-09 ENCOUNTER — Other Ambulatory Visit: Payer: Self-pay

## 2019-01-10 ENCOUNTER — Other Ambulatory Visit: Payer: Self-pay

## 2019-01-10 ENCOUNTER — Inpatient Hospital Stay: Payer: Medicare HMO

## 2019-01-10 ENCOUNTER — Encounter: Payer: Self-pay | Admitting: Internal Medicine

## 2019-01-10 ENCOUNTER — Inpatient Hospital Stay (HOSPITAL_BASED_OUTPATIENT_CLINIC_OR_DEPARTMENT_OTHER): Payer: Medicare HMO | Admitting: Internal Medicine

## 2019-01-10 DIAGNOSIS — E032 Hypothyroidism due to medicaments and other exogenous substances: Secondary | ICD-10-CM | POA: Diagnosis not present

## 2019-01-10 DIAGNOSIS — Z87891 Personal history of nicotine dependence: Secondary | ICD-10-CM | POA: Diagnosis not present

## 2019-01-10 DIAGNOSIS — Z7189 Other specified counseling: Secondary | ICD-10-CM

## 2019-01-10 DIAGNOSIS — G8918 Other acute postprocedural pain: Secondary | ICD-10-CM

## 2019-01-10 DIAGNOSIS — Z5112 Encounter for antineoplastic immunotherapy: Secondary | ICD-10-CM | POA: Diagnosis not present

## 2019-01-10 DIAGNOSIS — C772 Secondary and unspecified malignant neoplasm of intra-abdominal lymph nodes: Secondary | ICD-10-CM | POA: Diagnosis not present

## 2019-01-10 DIAGNOSIS — C678 Malignant neoplasm of overlapping sites of bladder: Secondary | ICD-10-CM

## 2019-01-10 DIAGNOSIS — N183 Chronic kidney disease, stage 3 (moderate): Secondary | ICD-10-CM

## 2019-01-10 DIAGNOSIS — D631 Anemia in chronic kidney disease: Secondary | ICD-10-CM

## 2019-01-10 DIAGNOSIS — I129 Hypertensive chronic kidney disease with stage 1 through stage 4 chronic kidney disease, or unspecified chronic kidney disease: Secondary | ICD-10-CM

## 2019-01-10 LAB — CBC WITH DIFFERENTIAL/PLATELET
Abs Immature Granulocytes: 0.02 10*3/uL (ref 0.00–0.07)
Basophils Absolute: 0.1 10*3/uL (ref 0.0–0.1)
Basophils Relative: 1 %
Eosinophils Absolute: 0.7 10*3/uL — ABNORMAL HIGH (ref 0.0–0.5)
Eosinophils Relative: 9 %
HCT: 30.6 % — ABNORMAL LOW (ref 39.0–52.0)
Hemoglobin: 10 g/dL — ABNORMAL LOW (ref 13.0–17.0)
Immature Granulocytes: 0 %
Lymphocytes Relative: 28 %
Lymphs Abs: 2.4 10*3/uL (ref 0.7–4.0)
MCH: 33.1 pg (ref 26.0–34.0)
MCHC: 32.7 g/dL (ref 30.0–36.0)
MCV: 101.3 fL — ABNORMAL HIGH (ref 80.0–100.0)
Monocytes Absolute: 0.6 10*3/uL (ref 0.1–1.0)
Monocytes Relative: 8 %
Neutro Abs: 4.6 10*3/uL (ref 1.7–7.7)
Neutrophils Relative %: 54 %
Platelets: 219 10*3/uL (ref 150–400)
RBC: 3.02 MIL/uL — ABNORMAL LOW (ref 4.22–5.81)
RDW: 12.6 % (ref 11.5–15.5)
WBC: 8.4 10*3/uL (ref 4.0–10.5)
nRBC: 0 % (ref 0.0–0.2)

## 2019-01-10 LAB — COMPREHENSIVE METABOLIC PANEL
ALT: 14 U/L (ref 0–44)
AST: 21 U/L (ref 15–41)
Albumin: 3.9 g/dL (ref 3.5–5.0)
Alkaline Phosphatase: 73 U/L (ref 38–126)
Anion gap: 8 (ref 5–15)
BUN: 31 mg/dL — ABNORMAL HIGH (ref 8–23)
CO2: 24 mmol/L (ref 22–32)
Calcium: 9.2 mg/dL (ref 8.9–10.3)
Chloride: 109 mmol/L (ref 98–111)
Creatinine, Ser: 2.11 mg/dL — ABNORMAL HIGH (ref 0.61–1.24)
GFR calc Af Amer: 32 mL/min — ABNORMAL LOW (ref >60)
GFR calc non Af Amer: 28 mL/min — ABNORMAL LOW (ref >60)
Glucose, Bld: 138 mg/dL — ABNORMAL HIGH (ref 70–99)
Potassium: 4.8 mmol/L (ref 3.5–5.1)
Sodium: 141 mmol/L (ref 135–145)
Total Bilirubin: 0.7 mg/dL (ref 0.3–1.2)
Total Protein: 6.9 g/dL (ref 6.5–8.1)

## 2019-01-10 MED ORDER — SODIUM CHLORIDE 0.9 % IV SOLN
Freq: Once | INTRAVENOUS | Status: AC
  Administered 2019-01-10: 10:00:00 via INTRAVENOUS
  Filled 2019-01-10: qty 250

## 2019-01-10 MED ORDER — HYDROCODONE-ACETAMINOPHEN 5-325 MG PO TABS: 1.0000 | ORAL_TABLET | Freq: Three times a day (TID) | ORAL | 0 refills | Status: DC | PRN

## 2019-01-10 MED ORDER — SODIUM CHLORIDE 0.9 % IV SOLN
1200.0000 mg | Freq: Once | INTRAVENOUS | Status: AC
  Administered 2019-01-10: 1200 mg via INTRAVENOUS
  Filled 2019-01-10: qty 20

## 2019-01-10 NOTE — Assessment & Plan Note (Addendum)
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  Stage IV;  JULY 2020- CT- NED;  thickening of bladder.  Stable.   # on Tecentiq; Labs today reviewed;  acceptable for treatment today.   # HTN- poorly controlled; recommend checking/bring a log.   # Iatrogenic hypothyroidism-on Synthroid 88 mcg- stable.  # Anemia sec to CKD/ on Iv iron.hb-9.3; Stable.   #Postoperative pain-dorsal incision for phimosis-given prescription for hydrocodone as needed.  If not improved recommend contacting urology.  # CKD stage III-IV- stable.  continue flomax 0.4. continue PO fluids.   # I updated patient's wife/ over the phone.   # DISPOSITION:  # treatment today. # Follow-up in 3 weeks-MD labs-cbc/cmp-Tecentriq IV-Dr.B

## 2019-01-10 NOTE — Progress Notes (Signed)
[facility] CONSULT NOTE  Patient Care Team: [name], MD as PCP - General (Internal Medicine)  CHIEF COMPLAINTS/PURPOSE OF CONSULTATION: Bladder cancer  #  Oncology History Overview Note  # AUG 2019-TRANSITIONAL CELL BLADDER CA [~ 4cm tumor] s/p cystoscopy [Dr.Stoiff]  with extensive angiolymphatic invasion; [name]; [name]; mediastinal uptake; right pubic rami uptake.  # 19th ep 2019- Tecentriq;    # Match 2020- HYPOTHYROIDISM [sec to [facility]]  # CKD stage III-IV [creat 2.5]; [name]; NO other targets**  DIAGNOSIS: Bladder ca  STAGE:   IV  ;GOALS: palliative  CURRENT/MOST RECENT THERAPY:Tecentriq      Cancer of overlapping sites of bladder ([place])  02/28/2018 -  Chemotherapy   The patient had atezolizumab (TECENTRIQ) 1,[name], 1,200 mg, Intravenous, Once, 14 of 17 cycles Administration: 1,200 mg (02/28/2018), 1,200 mg (03/21/2018), 1,200 mg (04/11/2018), 1,200 mg (05/02/2018), 1,200 mg (06/13/2018), 1,200 mg (05/23/2018), 1,200 mg (07/04/2018), 1,200 mg (07/25/2018), 1,200 mg (08/15/2018), 1,200 mg (09/05/2018), 1,200 mg (10/25/2018), 1,200 mg (11/22/2018), 1,200 mg (12/20/2018)  for chemotherapy treatment.       HISTORY OF PRESENTING ILLNESS: Edward Cummings 85 y.o.  male with metastatic transitional carcinoma of the bladder currently on Tecentriq is here for follow-up.  In the interim patient underwent cystoscopy/bilateral stent removal; and dorsal slit for phimosis.  No evidence of malignancy noted on cystoscopy.   Patient complains of mild pain at the site of penile incision.  Denies any blood in urine.  Appetite is fair.  No nausea no vomiting.  No diarrhea.   Review of Systems  Constitutional:  Positive for malaise/fatigue. Negative for chills, diaphoresis and fever.  HENT: Negative for nosebleeds and sore throat.   Eyes: Negative for double vision.  Respiratory: Negative for cough, hemoptysis, sputum production, shortness of breath and wheezing.   Cardiovascular: Negative for chest pain, palpitations, orthopnea and leg swelling.  Gastrointestinal: Negative for abdominal pain, blood in stool, diarrhea, heartburn, melena, nausea and vomiting.  Genitourinary: Negative for dysuria, frequency and urgency.  Musculoskeletal: Positive for back pain. Negative for joint pain.  Skin: Negative.  Negative for itching and rash.  Neurological: Negative for tingling, focal weakness, weakness and headaches.  Endo/Heme/Allergies: Does not bruise/bleed easily.  Psychiatric/Behavioral: Negative for depression. The patient is not nervous/anxious and does not have insomnia.      MEDICAL HISTORY:  Past Medical History:  Diagnosis Date  . Anemia   . Cancer ([place])    bladder  . Chronic kidney disease   . Depression   . Hypertension   . Neuromuscular disorder ([place])    Nerve damage to left face/eye since around 2002.    SURGICAL HISTORY: Past Surgical History:  Procedure Laterality Date  . CYSTOSCOPY W/ RETROGRADES Bilateral 01/25/2018   Procedure: CYSTOSCOPY WITH RETROGRADE PYELOGRAM;  [name], MD;  [name];  Service: Urology;  Laterality: Bilateral;  . CYSTOSCOPY W/ URETERAL STENT PLACEMENT Bilateral 01/06/2019   Procedure: CYSTOSCOPY WITH RETROGRADE PYELOGRAM/URETERAL STENT REMOVAL;  [name], MD;  [name];  Service: Urology;  Laterality: Bilateral;  . CYSTOSCOPY WITH STENT PLACEMENT Bilateral 01/25/2018   Procedure: CYSTOSCOPY WITH STENT PLACEMENT;  [name], MD;  [name];  Service: Urology;  Laterality: Bilateral;  .  DORSAL SLIT N/A 01/06/2019   Procedure: DORSAL SLIT;  [name], MD;  Location:  [facility] ORS;  Service: Urology;  [name];  . EYE SURGERY     Cornea transplants bilaterally & cataract surgery.  . TRANSURETHRAL RESECTION OF BLADDER TUMOR N/A 01/25/2018   Procedure: TRANSURETHRAL RESECTION OF BLADDER TUMOR (TURBT);  [name], MD;  [name];  Service: Urology;  [name];    SOCIAL HISTORY: lives in [place]; with wife; quit smoking 18 years ago; beer every 2 months or so.mechanic/retd.   Social History   Socioeconomic History  . Marital status: Married    Spouse name: [name]  . Number of children: Not on file  . Years of education: Not on file  . Highest education level: Not on file  Occupational History  . Not on file  Social Needs  . Financial resource strain: Not hard at all  . Food insecurity    Worry: Never true    Inability: Never true  . Transportation needs    Medical: Not on file    Non-medical: Not on file  Tobacco Use  . Smoking status: Former Research scientist (life sciences)  . Smokeless tobacco: Current User    Types: Chew  . Tobacco comment: Stopped approximately 10 years ago.  Substance and Sexual Activity  . Alcohol use: Yes    Alcohol/week: 2.0 standard drinks    Types: 2 Cans of beer per week    Comment: Daily  . Drug use: Never  . Sexual activity: Not on file  Lifestyle  . Physical activity    Days per week: Not on file    Minutes per session: Not on file  . Stress: Only a little  Relationships  . Social Herbalist on phone: Not on file    Gets together: Not on file    Attends religious service: Not on file    Active member of club or organization: Not on file    Attends meetings of clubs or organizations: Not on file    Relationship status: Not on file  . Intimate partner violence    Fear of current or ex partner: Not on file    Emotionally abused: Not on file    Physically abused: Not on file    Forced sexual activity: Not on file  Other Topics Concern  . Not on file  Social History Narrative   . Not on file    FAMILY HISTORY: Family History  Problem Relation Age of Onset  . Prostate cancer Neg Hx   . Kidney cancer Neg Hx   . Bladder Cancer Neg Hx     ALLERGIES:  has No Known Allergies.  MEDICATIONS:  Current Outpatient Medications  Medication Sig Dispense Refill  . amLODipine (NORVASC) 5 MG tablet Take 1 tablet (5 mg total) by mouth daily. 30 tablet 3  . aspirin EC 81 MG tablet Take 81 mg by mouth daily.     Marland Kitchen docusate sodium (COLACE) 50 MG capsule Take 50 mg by mouth at bedtime.    . fexofenadine (ALLEGRA) 180 MG tablet Take 180 mg by mouth daily.    . fluticasone (FLONASE) 50 MCG/ACT nasal spray Place 2 sprays into both nostrils daily as needed for allergies or rhinitis.    Marland Kitchen HYDROcodone-acetaminophen (NORCO/VICODIN) 5-325 MG tablet Take 1 tablet by mouth every 8 (eight) hours as needed for moderate pain. 30 tablet 0  . levothyroxine (SYNTHROID) 88 MCG tablet Take 1 tablet (88 mcg  total) by mouth daily before breakfast. Empty stomach- 1 hour prior to breakfast. 30 tablet 3  . Multiple Vitamin (MULTIVITAMIN WITH MINERALS) TABS tablet Take 1 tablet by mouth daily.     . prednisoLONE acetate (PRED FORTE) 1 % ophthalmic suspension Place 1 drop into both eyes daily.    . tamsulosin (FLOMAX) 0.4 MG CAPS capsule Take 1 capsule (0.4 mg total) by mouth at bedtime. 90 capsule 1   No current facility-administered medications for this visit.    Facility-Administered Medications Ordered in Other Visits  Medication Dose Route Frequency Provider Last Rate Last Dose  . atezolizumab (TECENTRIQ) 1,[name]  1,200 mg Intravenous Once Cammie Sickle, MD 540 mL/hr at 01/10/19 1035 1,200 mg at 01/10/19 1035      .  PHYSICAL EXAMINATION: ECOG PERFORMANCE STATUS: 1 - Symptomatic but completely ambulatory  Vitals:   01/10/19 0903  BP: (!) 160/67  Pulse: 65  Resp: 20  Temp: (!) 97 F (36.1 C)   Filed Weights   01/10/19 0903   Weight: 126 lb (57.2 kg)    Physical Exam  Constitutional: He is oriented to person, place, and time.  Alone.   Walking himself.  Thin built moderately nourished male patient.  HENT:  Head: Normocephalic and atraumatic.  Mouth/Throat: Oropharynx is clear and moist. No oropharyngeal exudate.  Eyes: Pupils are equal, round, and reactive to light.  Chronic drooping of the left eyelid.  Neck: Normal range of motion. Neck supple.  Cardiovascular: Normal rate and regular rhythm.  Pulmonary/Chest: No respiratory distress. He has no wheezes.  Abdominal: Soft. Bowel sounds are normal. He exhibits no distension and no mass. There is no abdominal tenderness. There is no rebound and no guarding.  Musculoskeletal: Normal range of motion.        General: No tenderness or edema.  Neurological: He is alert and oriented to person, place, and time.  Skin: Skin is warm.  Psychiatric: Affect normal.     LABORATORY DATA:  I have reviewed the data as listed Lab Results  Component Value Date   WBC 8.4 01/10/2019   HGB 10.0 (L) 01/10/2019   HCT 30.6 (L) 01/10/2019   MCV 101.3 (H) 01/10/2019   PLT 219 01/10/2019   Recent Labs    11/22/18 0855 12/20/18 0827 01/10/19 0830  NA 139 141 141  K 4.2 4.6 4.8  CL 111 110 109  CO2 _0 GLUCOSE 125* 108* 138*  BUN 27* 33* 31*  CREATININE 1.95* 2.24* 2.11*  CALCIUM 8.9 9.2 9.2  GFRNONAA 31* 26* 28*  GFRAA 36* 30* 32*  PROT 6.7 7.0 6.9  ALBUMIN 4.0 4.1 3.9  AST _1 ALT _2 ALKPHOS 68 72 73  BILITOT 0.7 0.7 0.7    RADIOGRAPHIC STUDIES: I have personally reviewed the radiological images as listed and agreed with the findings in the report. Ct Abdomen Pelvis Wo Contrast  Result Date: 12/16/2018 CLINICAL DATA:  Stage IV transitional cell bladder cancer diagnosed August 2019 with ongoing chemotherapy. Restaging. EXAM: CT CHEST, ABDOMEN AND PELVIS WITHOUT CONTRAST TECHNIQUE: Multidetector CT imaging of the chest, abdomen and pelvis  was performed following the standard protocol without IV contrast. COMPARISON:  07/23/2018 CT abdomen/pelvis.  02/26/2018 PET-CT. FINDINGS: CT CHEST FINDINGS Cardiovascular: Normal heart size. No significant pericardial effusion/thickening. Three-vessel coronary atherosclerosis. Atherosclerotic nonaneurysmal thoracic aorta. Normal caliber pulmonary arteries. Mediastinum/Nodes: No discrete thyroid nodules. Unremarkable esophagus. No pathologically enlarged  axillary, mediastinal or hilar lymph nodes, noting limited sensitivity for the detection of hilar adenopathy on this noncontrast study. Lungs/Pleura: No pneumothorax. Scattered calcified bilateral pleural plaques without pleural effusions. No acute consolidative airspace disease or lung masses. Two scattered tiny solid pulmonary nodules, largest 2 mm in the peripheral right upper lobe (series 3/image 65), both stable since 02/26/2018 PET-CT, presumably benign. No new significant pulmonary nodules. Patchy subpleural reticulation and ground-glass attenuation in the lower lungs with associated mild traction bronchiolectasis is not appreciably changed. Musculoskeletal:  No aggressive appearing focal osseous lesions. CT ABDOMEN PELVIS FINDINGS Hepatobiliary: Normal liver with no liver mass. Normal gallbladder with no radiopaque cholelithiasis. No biliary ductal dilatation. Pancreas: Normal, with no mass or duct dilation. Spleen: Normal size. No mass. Adrenals/Urinary Tract: Normal adrenals. Well-positioned bilateral nephroureteral stents with the proximal pigtail portions in the renal pelves and the distal pigtail portions in the bladder. Mild fullness of the right renal collecting system without overt hydronephrosis, stable. No left hydronephrosis. Parapelvic left renal cysts. No renal stones. Simple lower right renal cysts, largest 1.9 cm. No contour deforming left renal cortical lesions. Chronic mild diffuse bladder wall thickening is unchanged. No bladder stones or  discrete masses. Stomach/Bowel: Normal non-distended stomach. Normal caliber small bowel with no small bowel wall thickening. Normal appendix. Normal large bowel with no diverticulosis, large bowel wall thickening or pericolonic fat stranding. Vascular/Lymphatic: Atherosclerotic nonaneurysmal abdominal aorta. No pathologically enlarged lymph nodes in the abdomen or pelvis. Reproductive: Mildly enlarged prostate. Other: No pneumoperitoneum, ascites or focal fluid collection. Musculoskeletal: No aggressive appearing focal osseous lesions. Moderate lumbar spondylosis. IMPRESSION: 1. No evidence of metastatic disease in the chest, abdomen or pelvis. 2. Well-positioned bilateral nephroureteral stents without overt hydronephrosis. 3. 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 No pleural effusions. Stable basilar predominant pulmonary fibrosis suggestive of asbestosis. 4.  Aortic Atherosclerosis (ICD10-I70.0). Electronically Signed   By: Ilona Sorrel M.D.   On: 12/16/2018 13:35   Ct Chest Wo Contrast  Result Date: 12/16/2018 CLINICAL DATA:  Stage IV transitional cell bladder cancer diagnosed August 2019 with ongoing chemotherapy. Restaging. EXAM: CT CHEST, ABDOMEN AND PELVIS WITHOUT CONTRAST TECHNIQUE: Multidetector CT imaging of the chest, abdomen and pelvis was performed following the standard protocol without IV contrast. COMPARISON:  07/23/2018 CT abdomen/pelvis.  02/26/2018 PET-CT. FINDINGS: CT CHEST FINDINGS Cardiovascular: Normal heart size. No significant pericardial effusion/thickening. Three-vessel coronary atherosclerosis. Atherosclerotic nonaneurysmal thoracic aorta. Normal caliber pulmonary arteries. Mediastinum/Nodes: No discrete thyroid nodules. Unremarkable esophagus. No pathologically enlarged axillary, mediastinal or hilar lymph nodes, noting limited sensitivity for the detection of hilar adenopathy on this noncontrast study. Lungs/Pleura: No pneumothorax.  Scattered calcified bilateral pleural plaques without pleural effusions. No acute consolidative airspace disease or lung masses. Two scattered tiny solid pulmonary nodules, largest 2 mm in the peripheral right upper lobe (series 3/image 65), both stable since 02/26/2018 PET-CT, presumably benign. No new significant pulmonary nodules. Patchy subpleural reticulation and ground-glass attenuation in the lower lungs with associated mild traction bronchiolectasis is not appreciably changed. Musculoskeletal:  No aggressive appearing focal osseous lesions. CT ABDOMEN PELVIS FINDINGS Hepatobiliary: Normal liver with no liver mass. Normal gallbladder with no radiopaque cholelithiasis. No biliary ductal dilatation. Pancreas: Normal, with no mass or duct dilation. Spleen: Normal size. No mass. Adrenals/Urinary Tract: Normal adrenals. Well-positioned bilateral nephroureteral stents with the proximal pigtail portions in the renal pelves and the distal pigtail portions in the bladder. Mild fullness of the right renal collecting system without overt hydronephrosis, stable. No left hydronephrosis. Parapelvic  left renal cysts. No renal stones. Simple lower right renal cysts, largest 1.9 cm. No contour deforming left renal cortical lesions. Chronic mild diffuse bladder wall thickening is unchanged. No bladder stones or discrete masses. Stomach/Bowel: Normal non-distended stomach. Normal caliber small bowel with no small bowel wall thickening. Normal appendix. Normal large bowel with no diverticulosis, large bowel wall thickening or pericolonic fat stranding. Vascular/Lymphatic: Atherosclerotic nonaneurysmal abdominal aorta. No pathologically enlarged lymph nodes in the abdomen or pelvis. Reproductive: Mildly enlarged prostate. Other: No pneumoperitoneum, ascites or focal fluid collection. Musculoskeletal: No aggressive appearing focal osseous lesions. Moderate lumbar spondylosis. IMPRESSION: 1. No evidence of metastatic disease in the  chest, abdomen or pelvis. 2. Well-positioned bilateral nephroureteral stents without overt hydronephrosis. 3. 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 No pleural effusions. Stable basilar predominant pulmonary fibrosis suggestive of asbestosis. 4.  Aortic Atherosclerosis (ICD10-I70.0). Electronically Signed   By: Ilona Sorrel M.D.   On: 12/16/2018 13:35   Dg C-arm 1-60 Min-no Report  Result Date: 01/06/2019 Fluoroscopy was utilized by the requesting physician.  No radiographic interpretation.    ASSESSMENT & PLAN:   Cancer of overlapping sites of bladder ([place]) # High-grade transitional cell carcinoma of the bladder metastatic to retroperitoneal lymph node.  Stage IV;  JULY 2020- CT- NED;  thickening of bladder.  Stable.   # on Tecentiq; Labs today reviewed;  acceptable for treatment today.   # HTN- poorly controlled; recommend checking/bring a log.   # Iatrogenic hypothyroidism-on Synthroid 88 mcg- stable.  # Anemia sec to CKD/ on Iv iron.hb-9.3; Stable.   #Postoperative pain-dorsal incision for phimosis-given prescription for hydrocodone as needed.  If not improved recommend contacting urology.  # CKD stage III-IV- stable.  continue flomax 0.4. continue PO fluids.   # I updated patient's wife/ over the phone.   # DISPOSITION:  # treatment today. # Follow-up in 3 weeks-MD labs-cbc/cmp-Tecentriq IV-Dr.B  All questions were answered. The patient knows to call the clinic with any problems, questions or concerns.    Cammie Sickle, MD 01/10/2019 10:45 AM

## 2019-01-30 ENCOUNTER — Other Ambulatory Visit: Payer: Self-pay

## 2019-01-31 ENCOUNTER — Inpatient Hospital Stay (HOSPITAL_BASED_OUTPATIENT_CLINIC_OR_DEPARTMENT_OTHER): Payer: Medicare HMO | Admitting: Internal Medicine

## 2019-01-31 ENCOUNTER — Inpatient Hospital Stay: Payer: Medicare HMO | Attending: Internal Medicine

## 2019-01-31 ENCOUNTER — Other Ambulatory Visit: Payer: Self-pay

## 2019-01-31 ENCOUNTER — Inpatient Hospital Stay: Payer: Medicare HMO

## 2019-01-31 ENCOUNTER — Encounter: Payer: Self-pay | Admitting: Internal Medicine

## 2019-01-31 VITALS — BP 172/70 | HR 66 | Temp 97.3°F | Resp 18

## 2019-01-31 DIAGNOSIS — C678 Malignant neoplasm of overlapping sites of bladder: Secondary | ICD-10-CM | POA: Diagnosis not present

## 2019-01-31 DIAGNOSIS — F329 Major depressive disorder, single episode, unspecified: Secondary | ICD-10-CM | POA: Insufficient documentation

## 2019-01-31 DIAGNOSIS — D631 Anemia in chronic kidney disease: Secondary | ICD-10-CM | POA: Diagnosis not present

## 2019-01-31 DIAGNOSIS — Z87891 Personal history of nicotine dependence: Secondary | ICD-10-CM | POA: Diagnosis not present

## 2019-01-31 DIAGNOSIS — N184 Chronic kidney disease, stage 4 (severe): Secondary | ICD-10-CM | POA: Insufficient documentation

## 2019-01-31 DIAGNOSIS — Z5112 Encounter for antineoplastic immunotherapy: Secondary | ICD-10-CM | POA: Diagnosis not present

## 2019-01-31 DIAGNOSIS — Z79899 Other long term (current) drug therapy: Secondary | ICD-10-CM | POA: Insufficient documentation

## 2019-01-31 DIAGNOSIS — R69 Illness, unspecified: Secondary | ICD-10-CM | POA: Diagnosis not present

## 2019-01-31 DIAGNOSIS — E039 Hypothyroidism, unspecified: Secondary | ICD-10-CM | POA: Insufficient documentation

## 2019-01-31 DIAGNOSIS — Z7982 Long term (current) use of aspirin: Secondary | ICD-10-CM | POA: Insufficient documentation

## 2019-01-31 DIAGNOSIS — I10 Essential (primary) hypertension: Secondary | ICD-10-CM | POA: Insufficient documentation

## 2019-01-31 DIAGNOSIS — Z7189 Other specified counseling: Secondary | ICD-10-CM

## 2019-01-31 LAB — CBC WITH DIFFERENTIAL/PLATELET
Abs Immature Granulocytes: 0.02 10*3/uL (ref 0.00–0.07)
Basophils Absolute: 0.1 10*3/uL (ref 0.0–0.1)
Basophils Relative: 1 %
Eosinophils Absolute: 0.9 10*3/uL — ABNORMAL HIGH (ref 0.0–0.5)
Eosinophils Relative: 15 %
HCT: 29.1 % — ABNORMAL LOW (ref 39.0–52.0)
Hemoglobin: 9.7 g/dL — ABNORMAL LOW (ref 13.0–17.0)
Immature Granulocytes: 0 %
Lymphocytes Relative: 32 %
Lymphs Abs: 1.8 10*3/uL (ref 0.7–4.0)
MCH: 33 pg (ref 26.0–34.0)
MCHC: 33.3 g/dL (ref 30.0–36.0)
MCV: 99 fL (ref 80.0–100.0)
Monocytes Absolute: 0.4 10*3/uL (ref 0.1–1.0)
Monocytes Relative: 7 %
Neutro Abs: 2.6 10*3/uL (ref 1.7–7.7)
Neutrophils Relative %: 45 %
Platelets: 235 10*3/uL (ref 150–400)
RBC: 2.94 MIL/uL — ABNORMAL LOW (ref 4.22–5.81)
RDW: 12.3 % (ref 11.5–15.5)
WBC: 5.8 10*3/uL (ref 4.0–10.5)
nRBC: 0 % (ref 0.0–0.2)

## 2019-01-31 LAB — COMPREHENSIVE METABOLIC PANEL
ALT: 13 U/L (ref 0–44)
AST: 22 U/L (ref 15–41)
Albumin: 4.1 g/dL (ref 3.5–5.0)
Alkaline Phosphatase: 68 U/L (ref 38–126)
Anion gap: 8 (ref 5–15)
BUN: 30 mg/dL — ABNORMAL HIGH (ref 8–23)
CO2: 23 mmol/L (ref 22–32)
Calcium: 9.1 mg/dL (ref 8.9–10.3)
Chloride: 108 mmol/L (ref 98–111)
Creatinine, Ser: 2.2 mg/dL — ABNORMAL HIGH (ref 0.61–1.24)
GFR calc Af Amer: 31 mL/min — ABNORMAL LOW (ref >60)
GFR calc non Af Amer: 27 mL/min — ABNORMAL LOW (ref >60)
Glucose, Bld: 121 mg/dL — ABNORMAL HIGH (ref 70–99)
Potassium: 4 mmol/L (ref 3.5–5.1)
Sodium: 139 mmol/L (ref 135–145)
Total Bilirubin: 0.8 mg/dL (ref 0.3–1.2)
Total Protein: 7.2 g/dL (ref 6.5–8.1)

## 2019-01-31 MED ORDER — SODIUM CHLORIDE 0.9 % IV SOLN
Freq: Once | INTRAVENOUS | Status: AC
  Administered 2019-01-31: 10:00:00 via INTRAVENOUS
  Filled 2019-01-31: qty 250

## 2019-01-31 MED ORDER — SODIUM CHLORIDE 0.9 % IV SOLN
1200.0000 mg | Freq: Once | INTRAVENOUS | Status: AC
  Administered 2019-01-31: 1200 mg via INTRAVENOUS
  Filled 2019-01-31: qty 20

## 2019-01-31 NOTE — Progress Notes (Signed)
[facility] CONSULT NOTE  Patient Care Team: [name], MD as PCP - General (Internal Medicine)  CHIEF COMPLAINTS/PURPOSE OF CONSULTATION: Bladder cancer  #  Oncology History Overview Note  # AUG 2019-TRANSITIONAL CELL BLADDER CA [~ 4cm tumor] s/p cystoscopy [Dr.Stoiff]  with extensive angiolymphatic invasion; [name]; [name]; mediastinal uptake; right pubic rami uptake.  # 19th ep 2019- Tecentriq;    # Match 2020- HYPOTHYROIDISM [sec to T[place]]  # CKD stage III-IV [creat 2.5]; [name]; NO other targets**  DIAGNOSIS: Bladder ca  STAGE:   IV  ;GOALS: palliative  CURRENT/MOST RECENT THERAPY:Tecentriq      Cancer of overlapping sites of bladder ([place])  02/28/2018 -  Chemotherapy   The patient had atezolizumab (TECENTRIQ) 1,[name], 1,200 mg, Intravenous, Once, 14 of 17 cycles Administration: 1,200 mg (02/28/2018), 1,200 mg (03/21/2018), 1,200 mg (04/11/2018), 1,200 mg (05/02/2018), 1,200 mg (06/13/2018), 1,200 mg (05/23/2018), 1,200 mg (07/04/2018), 1,200 mg (07/25/2018), 1,200 mg (08/15/2018), 1,200 mg (09/05/2018), 1,200 mg (10/25/2018), 1,200 mg (11/22/2018), 1,200 mg (12/20/2018), 1,200 mg (01/10/2019)  for chemotherapy treatment.       HISTORY OF PRESENTING ILLNESS: Edward Cummings 85 y.o.  male with metastatic transitional carcinoma of the bladder currently on Tecentriq is here for follow-up.  In the interim patient's wife was admitted to the hospital after recent shoulder surgery.  Patient interim also lost his dog.  He is quite upset.  Lost weight.   Denies any blood in urine.  Poor appetite.  4 pound weight loss.  No nausea no vomiting.  No diarrhea.   Review of Systems  Constitutional: Positive for  malaise/fatigue. Negative for chills, diaphoresis and fever.  HENT: Negative for nosebleeds and sore throat.   Eyes: Negative for double vision.  Respiratory: Negative for cough, hemoptysis, sputum production, shortness of breath and wheezing.   Cardiovascular: Negative for chest pain, palpitations, orthopnea and leg swelling.  Gastrointestinal: Negative for abdominal pain, blood in stool, diarrhea, heartburn, melena, nausea and vomiting.  Genitourinary: Negative for dysuria, frequency and urgency.  Musculoskeletal: Positive for back pain. Negative for joint pain.  Skin: Negative.  Negative for itching and rash.  Neurological: Negative for tingling, focal weakness, weakness and headaches.  Endo/Heme/Allergies: Does not bruise/bleed easily.  Psychiatric/Behavioral: Negative for depression. The patient is not nervous/anxious and does not have insomnia.      MEDICAL HISTORY:  Past Medical History:  Diagnosis Date  . Anemia   . Cancer ([place])    bladder  . Chronic kidney disease   . Depression   . Hypertension   . Neuromuscular disorder ([place])    Nerve damage to left face/eye since around 2002.    SURGICAL HISTORY: Past Surgical History:  Procedure Laterality Date  . CYSTOSCOPY W/ RETROGRADES Bilateral 01/25/2018   Procedure: CYSTOSCOPY WITH RETROGRADE PYELOGRAM;  [name], MD;  [name];  Service: Urology;  Laterality: Bilateral;  . CYSTOSCOPY W/ URETERAL STENT PLACEMENT Bilateral 01/06/2019   Procedure: CYSTOSCOPY WITH RETROGRADE PYELOGRAM/URETERAL STENT REMOVAL;  [name], MD;  [name];  Service: Urology;  Laterality: Bilateral;  . CYSTOSCOPY WITH STENT PLACEMENT Bilateral 01/25/2018   Procedure: CYSTOSCOPY WITH STENT PLACEMENT;  [name], MD;  [name];  Service: Urology;  Laterality:  Bilateral;  . DORSAL SLIT N/A 01/06/2019   Procedure: DORSAL SLIT;  [name], MD;  [name];   Service: Urology;  [name];  . EYE SURGERY     Cornea transplants bilaterally & cataract surgery.  . TRANSURETHRAL RESECTION OF BLADDER TUMOR N/A 01/25/2018   Procedure: TRANSURETHRAL RESECTION OF BLADDER TUMOR (TURBT);  [name], MD;  [name];  Service: Urology;  [name];    SOCIAL HISTORY: lives in [place]; with wife; quit smoking 18 years ago; beer every 2 months or so.mechanic/retd.   Social History   Socioeconomic History  . Marital status: Married    Spouse name: [name]  . Number of children: Not on file  . Years of education: Not on file  . Highest education level: Not on file  Occupational History  . Not on file  Social Needs  . Financial resource strain: Not hard at all  . Food insecurity    Worry: Never true    Inability: Never true  . Transportation needs    Medical: Not on file    Non-medical: Not on file  Tobacco Use  . Smoking status: Former Research scientist (life sciences)  . Smokeless tobacco: Current User    Types: Chew  . Tobacco comment: Stopped approximately 10 years ago.  Substance and Sexual Activity  . Alcohol use: Yes    Alcohol/week: 2.0 standard drinks    Types: 2 Cans of beer per week    Comment: Daily  . Drug use: Never  . Sexual activity: Not on file  Lifestyle  . Physical activity    Days per week: Not on file    Minutes per session: Not on file  . Stress: Only a little  Relationships  . Social Herbalist on phone: Not on file    Gets together: Not on file    Attends religious service: Not on file    Active member of club or organization: Not on file    Attends meetings of clubs or organizations: Not on file    Relationship status: Not on file  . Intimate partner violence    Fear of current or ex partner: Not on file    Emotionally abused: Not on file    Physically abused: Not on file    Forced sexual activity: Not on file  Other Topics Concern  . Not on file  Social History Narrative  . Not on  file    FAMILY HISTORY: Family History  Problem Relation Age of Onset  . Prostate cancer Neg Hx   . Kidney cancer Neg Hx   . Bladder Cancer Neg Hx     ALLERGIES:  has No Known Allergies.  MEDICATIONS:  Current Outpatient Medications  Medication Sig Dispense Refill  . amLODipine (NORVASC) 5 MG tablet Take 1 tablet (5 mg total) by mouth daily. 30 tablet 3  . aspirin EC 81 MG tablet Take 81 mg by mouth daily.     Marland Kitchen docusate sodium (COLACE) 50 MG capsule Take 50 mg by mouth at bedtime.    . fexofenadine (ALLEGRA) 180 MG tablet Take 180 mg by mouth daily.    . fluticasone (FLONASE) 50 MCG/ACT nasal spray Place 2 sprays into both nostrils daily as needed for allergies or rhinitis.    Marland Kitchen HYDROcodone-acetaminophen (NORCO/VICODIN) 5-325 MG tablet Take 1 tablet by mouth every 8 (eight) hours as needed for moderate pain. 30 tablet 0  . levothyroxine (SYNTHROID) 88 MCG tablet Take 1  tablet (88 mcg total) by mouth daily before breakfast. Empty stomach- 1 hour prior to breakfast. 30 tablet 3  . Multiple Vitamin (MULTIVITAMIN WITH MINERALS) TABS tablet Take 1 tablet by mouth daily.     . prednisoLONE acetate (PRED FORTE) 1 % ophthalmic suspension Place 1 drop into both eyes daily.    . tamsulosin (FLOMAX) 0.4 MG CAPS capsule Take 1 capsule (0.4 mg total) by mouth at bedtime. 90 capsule 1   No current facility-administered medications for this visit.       Marland Kitchen  PHYSICAL EXAMINATION: ECOG PERFORMANCE STATUS: 1 - Symptomatic but completely ambulatory  Vitals:   01/31/19 0903  BP: (!) 185/74  Pulse: 62  Resp: 16   Filed Weights   01/31/19 0903  Weight: 122 lb (55.3 kg)    Physical Exam  Constitutional: He is oriented to person, place, and time.  Alone.   Walking himself.  Thin built moderately nourished male patient.  HENT:  Head: Normocephalic and atraumatic.  Mouth/Throat: Oropharynx is clear and moist. No oropharyngeal exudate.  Eyes: Pupils are equal, round, and reactive to light.   Chronic drooping of the left eyelid.  Neck: Normal range of motion. Neck supple.  Cardiovascular: Normal rate and regular rhythm.  Pulmonary/Chest: No respiratory distress. He has no wheezes.  Abdominal: Soft. Bowel sounds are normal. He exhibits no distension and no mass. There is no abdominal tenderness. There is no rebound and no guarding.  Musculoskeletal: Normal range of motion.        General: No tenderness or edema.  Neurological: He is alert and oriented to person, place, and time.  Skin: Skin is warm.  Psychiatric: Affect normal.     LABORATORY DATA:  I have reviewed the data as listed Lab Results  Component Value Date   WBC 5.8 01/31/2019   HGB 9.7 (L) 01/31/2019   HCT 29.1 (L) 01/31/2019   MCV 99.0 01/31/2019   PLT 235 01/31/2019   Recent Labs    12/20/18 0827 01/10/19 0830 01/31/19 0825  NA 141 141 139  K 4.6 4.8 4.0  CL 110 109 108  CO2 '23 24 23  ' GLUCOSE 108* 138* 121*  BUN 33* 31* 30*  CREATININE 2.24* 2.11* 2.20*  CALCIUM 9.2 9.2 9.1  GFRNONAA 26* 28* 27*  GFRAA 30* 32* 31*  PROT 7.0 6.9 7.2  ALBUMIN 4.1 3.9 4.1  AST '20 21 22  ' ALT '15 14 13  ' ALKPHOS 72 73 68  BILITOT 0.7 0.7 0.8    RADIOGRAPHIC STUDIES: I have personally reviewed the radiological images as listed and agreed with the findings in the report. Dg C-arm 1-60 Min-no Report  Result Date: 01/06/2019 Fluoroscopy was utilized by the requesting physician.  No radiographic interpretation.    ASSESSMENT & PLAN:   Cancer of overlapping sites of bladder ([place]) # High-grade transitional cell carcinoma of the bladder metastatic to retroperitoneal lymph node.  Stage IV;  July, 6th 2020- CT- NED;  thickening of bladder.  Stable.   # on Tecentiq; Labs today reviewed;  acceptable for treatment today. Will repeat scans in mid October.   # HTN- poorly controlled- 185/74-; at home 120-130s;  recommend checking/bring a log. Ok to proceed with Tecentriq.   # Iatrogenic hypothyroidism-on Synthroid 88  mcg- stable.  # Anemia sec to CKD/ on Iv iron.hb-9.3; Stable.   # CKD stage III-IV- STABLE.   # I called pt's son- Mike-LVM.   # DISPOSITION:  # treatment today. # Follow-up in 3 weeks-MD labs-cbc/cmp-Tecentriq  IV-Dr.B  All questions were answered. The patient knows to call the clinic with any problems, questions or concerns.    Cammie Sickle, MD 01/31/2019 9:25 AM

## 2019-01-31 NOTE — Assessment & Plan Note (Addendum)
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  Stage IV;  July, 6th 2020- CT- NED;  thickening of bladder.  Stable.   # on Tecentiq; Labs today reviewed;  acceptable for treatment today. Will repeat scans in mid October.   # HTN- poorly controlled- 185/74-; at home 120-130s;  recommend checking/bring a log. Ok to proceed with Tecentriq.   # Iatrogenic hypothyroidism-on Synthroid 88 mcg- stable.  # Anemia sec to CKD/ on Iv iron.hb-9.3; Stable.   # CKD stage III-IV- STABLE.   # I called pt's son- Mike-LVM.   # DISPOSITION:  # treatment today. # Follow-up in 3 weeks-MD labs-cbc/cmp-Tecentriq IV-Dr.B

## 2019-02-02 ENCOUNTER — Telehealth: Payer: Self-pay | Admitting: Internal Medicine

## 2019-02-02 NOTE — Telephone Encounter (Signed)
Spoke to patient's son Edward Cummings regarding his father's care.  Patient's wife has been recently diagnosed with blood clot and also lung mass.  She will follow-up with me next week.

## 2019-02-07 ENCOUNTER — Encounter: Payer: Self-pay | Admitting: Urology

## 2019-02-07 ENCOUNTER — Ambulatory Visit: Payer: Medicare HMO | Admitting: Urology

## 2019-02-07 ENCOUNTER — Other Ambulatory Visit: Payer: Self-pay

## 2019-02-07 VITALS — BP 143/62 | HR 69 | Ht 68.0 in | Wt 125.0 lb

## 2019-02-07 DIAGNOSIS — Z09 Encounter for follow-up examination after completed treatment for conditions other than malignant neoplasm: Secondary | ICD-10-CM | POA: Diagnosis not present

## 2019-02-07 DIAGNOSIS — C679 Malignant neoplasm of bladder, unspecified: Secondary | ICD-10-CM

## 2019-02-07 NOTE — Progress Notes (Signed)
02/07/2019 10:18 AM   [name] Nov 12, 1932 [id number]  Referring provider: [name], MD [address] [place],  [place] [address]  Chief Complaint  Patient presents with  . Routine Post Op    HPI: 85 y.o. male presents for postop follow-up.  He has a history of metastatic urothelial carcinoma the bladder with bulky retroperitoneal adenopathy.  He had indwelling stents which have been present for over 1 year.  He underwent bilateral retrograde pyelograms on 01/06/2019 with no persistent hydronephrosis and his stents were removed.  He also underwent a dorsal slit for phimosis.  He complained of rubbing of his clothes on his glans penis initially after the surgery and was able to cover with padding.  He denies flank or abdominal pain.   PMH: Past Medical History:  Diagnosis Date  . Anemia   . Cancer ([place])    bladder  . Chronic kidney disease   . Depression   . Hypertension   . Neuromuscular disorder ([place])    Nerve damage to left face/eye since around 2002.    Surgical History: Past Surgical History:  Procedure Laterality Date  . CYSTOSCOPY W/ RETROGRADES Bilateral 01/25/2018   Procedure: CYSTOSCOPY WITH RETROGRADE PYELOGRAM;  [name], MD;  [name];  Service: Urology;  Laterality: Bilateral;  . CYSTOSCOPY W/ URETERAL STENT PLACEMENT Bilateral 01/06/2019   Procedure: CYSTOSCOPY WITH RETROGRADE PYELOGRAM/URETERAL STENT REMOVAL;  [name], MD;  [name];  Service: Urology;  Laterality: Bilateral;  . CYSTOSCOPY WITH STENT PLACEMENT Bilateral 01/25/2018   Procedure: CYSTOSCOPY WITH STENT PLACEMENT;  [name], MD;  [name];  Service: Urology;  Laterality: Bilateral;  . DORSAL SLIT N/A 01/06/2019   Procedure: DORSAL SLIT;  [name], MD;  [name];  Service: Urology;  [name];  . EYE SURGERY     Cornea transplants bilaterally & cataract surgery.  . TRANSURETHRAL  RESECTION OF BLADDER TUMOR N/A 01/25/2018   Procedure: TRANSURETHRAL RESECTION OF BLADDER TUMOR (TURBT);  [name], MD;  [name];  Service: Urology;  [name];    Home Medications:  Allergies as of 02/07/2019   No Known Allergies     Medication List       Accurate as of February 07, 2019 10:18 AM. If you have any questions, ask your nurse or doctor.        amLODipine 5 MG tablet Commonly known as: NORVASC Take 1 tablet (5 mg total) by mouth daily.   aspirin EC 81 MG tablet Take 81 mg by mouth daily.   docusate sodium 50 MG capsule Commonly known as: COLACE Take 50 mg by mouth at bedtime.   fexofenadine 180 MG tablet Commonly known as: ALLEGRA Take 180 mg by mouth daily.   fluticasone 50 MCG/ACT nasal spray Commonly known as: FLONASE Place 2 sprays into both nostrils daily as needed for allergies or rhinitis.   HYDROcodone-acetaminophen 5-325 MG tablet Commonly known as: NORCO/VICODIN Take 1 tablet by mouth every 8 (eight) hours as needed for moderate pain.   levothyroxine 88 MCG tablet Commonly known as: Synthroid Take 1 tablet (88 mcg total) by mouth daily before breakfast. Empty stomach- 1 hour prior to breakfast.   multivitamin with minerals Tabs tablet Take 1 tablet by mouth daily.   prednisoLONE acetate 1 % ophthalmic suspension Commonly known as: PRED FORTE Place 1 drop into both eyes daily.   tamsulosin 0.4 MG Caps capsule Commonly known as:  FLOMAX Take 1 capsule (0.4 mg total) by mouth at bedtime.       Allergies: No Known Allergies  Family History: Family History  Problem Relation Age of Onset  . Prostate cancer Neg Hx   . Kidney cancer Neg Hx   . Bladder Cancer Neg Hx     Social History:  reports that he has quit smoking. His smokeless tobacco use includes chew. He reports current alcohol use of about 2.0 standard drinks of alcohol per week. He reports that he does not use drugs.  ROS: UROLOGY Frequent  Urination?: No Hard to postpone urination?: No Burning/pain with urination?: No Get up at night to urinate?: No Leakage of urine?: No Urine stream starts and stops?: No Trouble starting stream?: No Do you have to strain to urinate?: No Blood in urine?: No Urinary tract infection?: No Sexually transmitted disease?: No Injury to kidneys or bladder?: No Painful intercourse?: No Weak stream?: No Erection problems?: No Penile pain?: No  Gastrointestinal Nausea?: No Vomiting?: No Indigestion/heartburn?: No Diarrhea?: No Constipation?: No  Constitutional Fever: No Night sweats?: No Weight loss?: No Fatigue?: No  Skin Skin rash/lesions?: No Itching?: No  Eyes Blurred vision?: No Double vision?: No  Ears/Nose/Throat Sore throat?: No Sinus problems?: No  Hematologic/Lymphatic Swollen glands?: No Easy bruising?: No  Cardiovascular Leg swelling?: No Chest pain?: No  Respiratory Cough?: No Shortness of breath?: No  Endocrine Excessive thirst?: No  Musculoskeletal Back pain?: No Joint pain?: No  Neurological Headaches?: No Dizziness?: No  Psychologic Depression?: No Anxiety?: Yes  Physical Exam: BP (!) 143/62 (BP Location: Left Arm, Patient Position: Sitting, Cuff Size: Normal)   Pulse 69   Ht 5\' 8"  (1.727 m)   Wt 125 lb (56.7 kg)   BMI 19.01 kg/m   Constitutional:  Alert and oriented, No acute distress. HEENT: [place] AT, moist mucus membranes.  Trachea midline, no masses. Cardiovascular: No clubbing, cyanosis, or edema. Respiratory: Normal respiratory effort, no increased work of breathing. GI: Abdomen is soft, nontender, nondistended, no abdominal masses GU: Dorsal slit site healing well.  There is an approximately 1 cm area of denuded epithelium glans penis dorsally which is healing. Lymph: No cervical or inguinal lymphadenopathy. Skin: No rashes, bruises or suspicious lesions. Neurologic: Grossly intact, no focal deficits, moving all 4  extremities. Psychiatric: Normal mood and affect.   Assessment & Plan:    - Urothelial carcinoma bladder Schedule follow-up renal ultrasound post stent removal.  He will be notified with results.  Follow-up cystoscopy 1 year.  - Status post dorsal slit Surgical site healing   Abbie Sons, MD  [facility] [address] [place], [place] [address] [phone]

## 2019-02-20 ENCOUNTER — Other Ambulatory Visit: Payer: Self-pay

## 2019-02-20 ENCOUNTER — Ambulatory Visit
Admission: RE | Admit: 2019-02-20 | Discharge: 2019-02-20 | Disposition: A | Discharge status: Home / Self Care | Payer: Medicare HMO | Source: Ambulatory Visit | Attending: Urology | Admitting: Urology

## 2019-02-20 DIAGNOSIS — C679 Malignant neoplasm of bladder, unspecified: Secondary | ICD-10-CM | POA: Insufficient documentation

## 2019-02-20 DIAGNOSIS — N281 Cyst of kidney, acquired: Secondary | ICD-10-CM | POA: Diagnosis not present

## 2019-02-20 IMAGING — US US RENAL
1 series · 14 of 25 positions shown · non-contrast
Comparison: [DATE]

CLINICAL DATA: Known bladder carcinoma

EXAM:
RENAL / URINARY TRACT ULTRASOUND COMPLETE

[Series 1: us renal · 0.23mm/px · 14 of 47 slices shown]
[im 1/47]
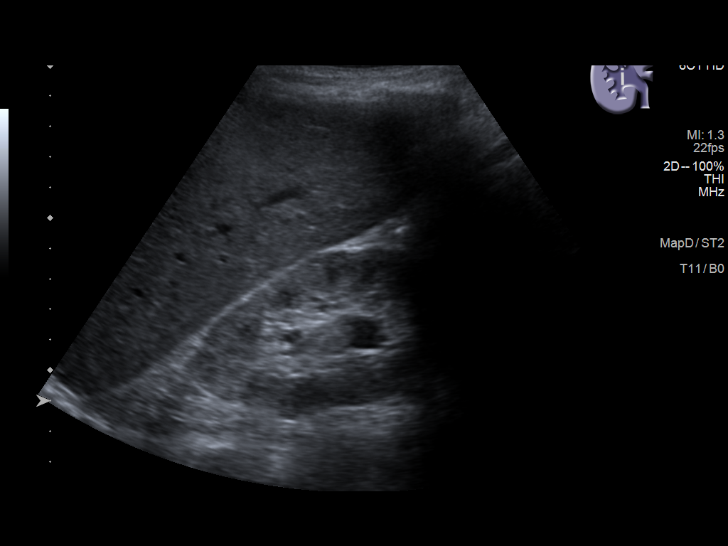
[im 4/47]
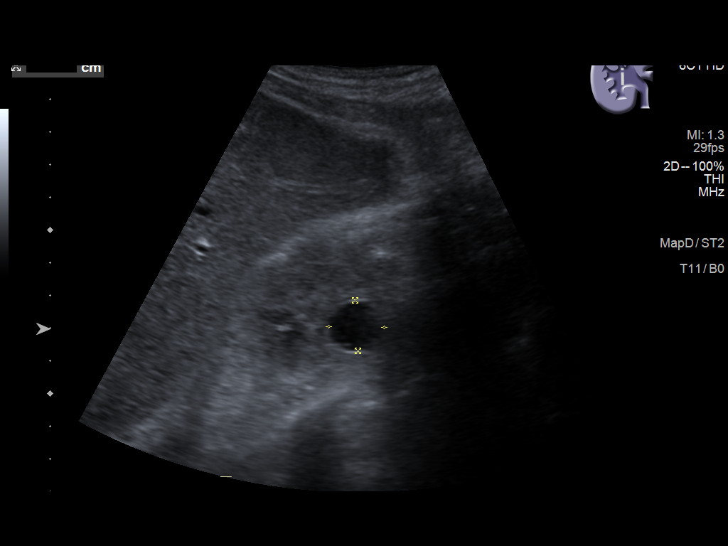
[im 8/47]
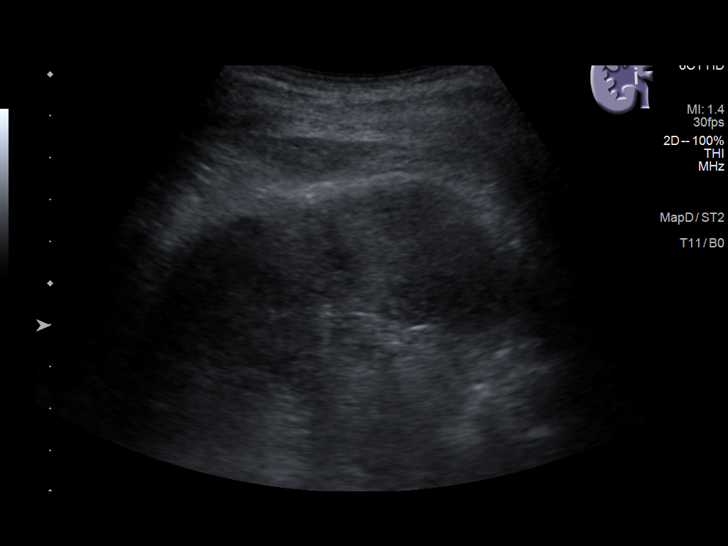
[im 12/47]
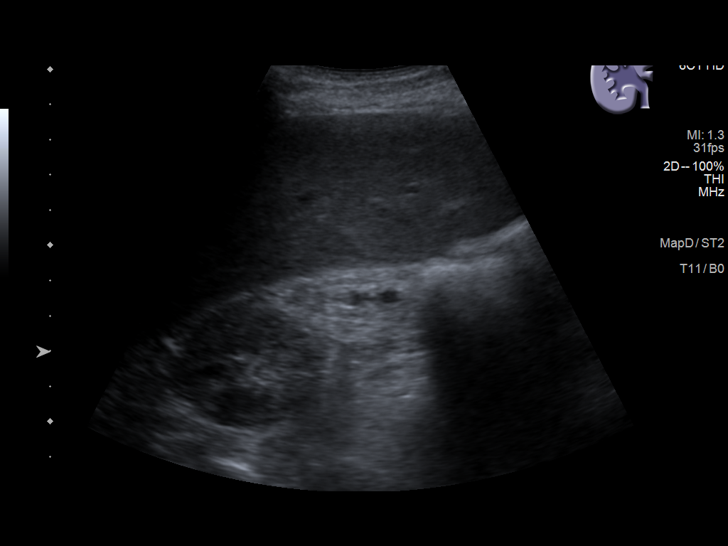
[im 16/47]
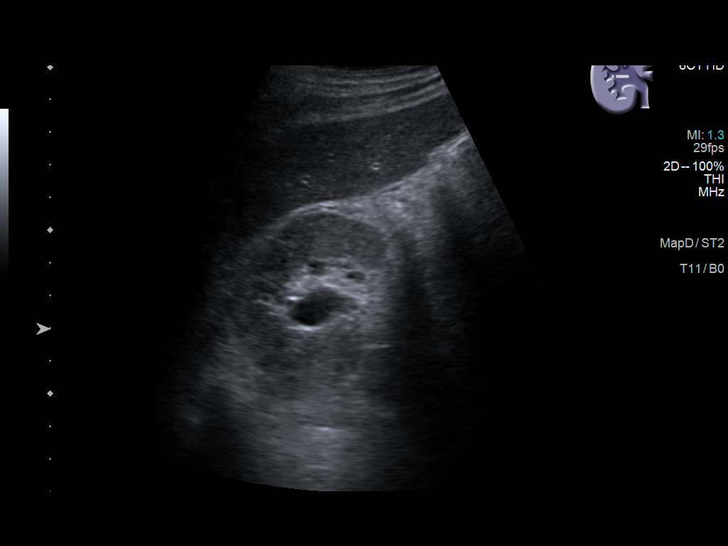
[im 18/47]
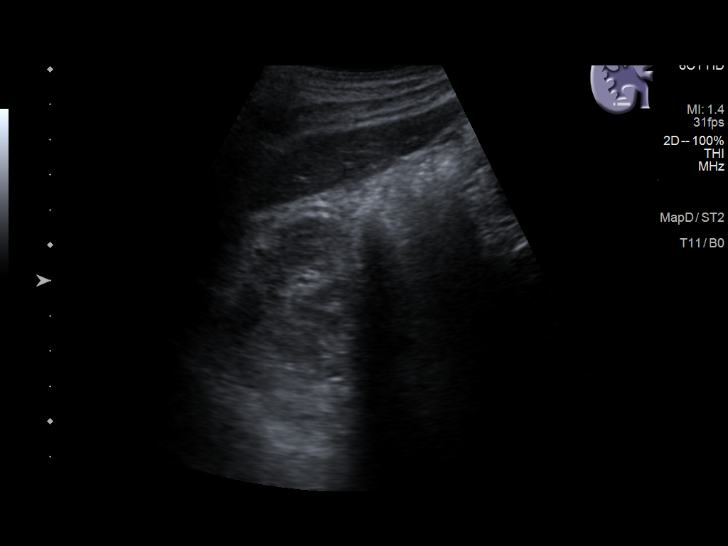
[im 22/47]
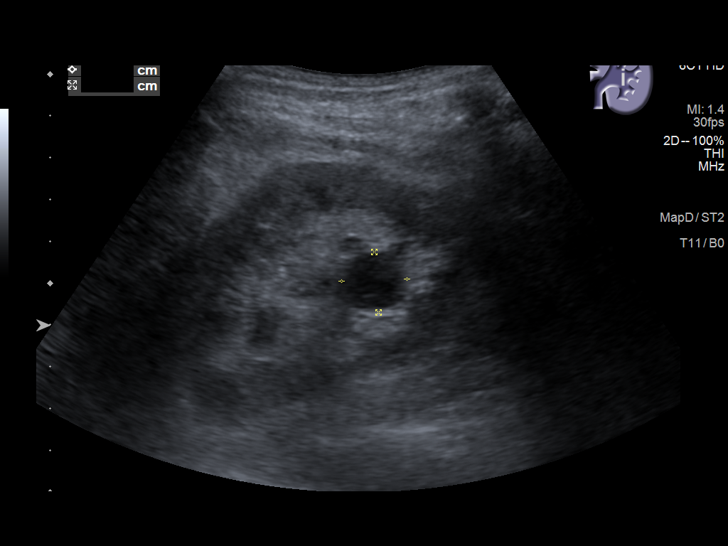
[im 25/47]
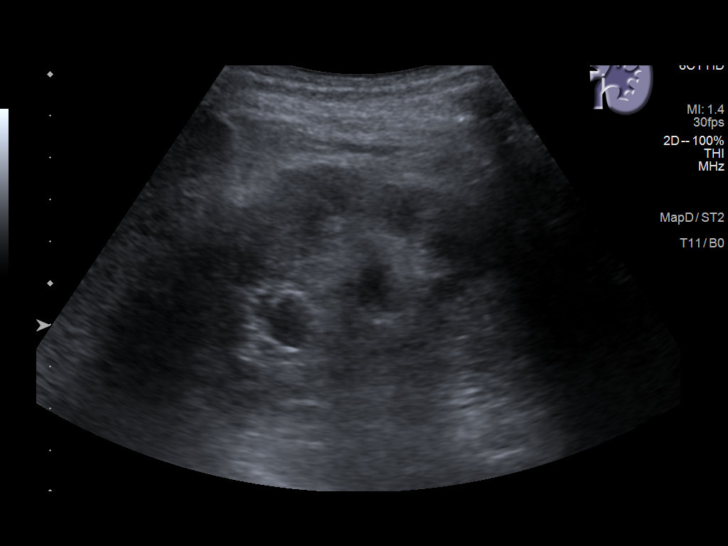
[im 29/47]
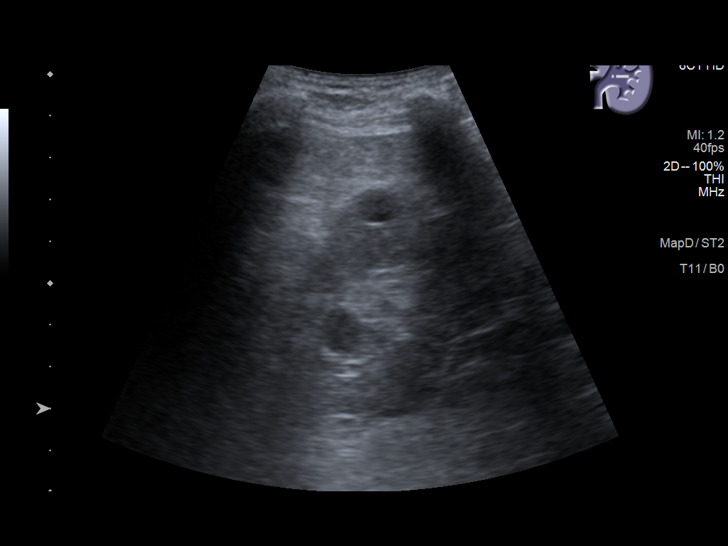
[im 31/47]
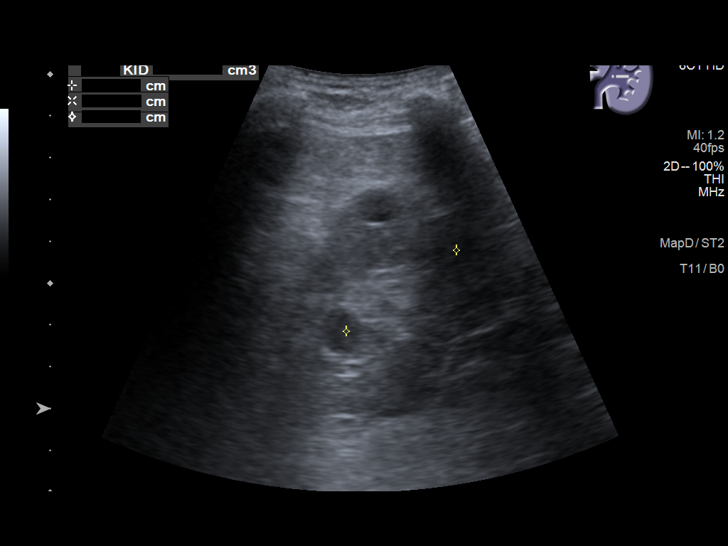
[im 35/47]
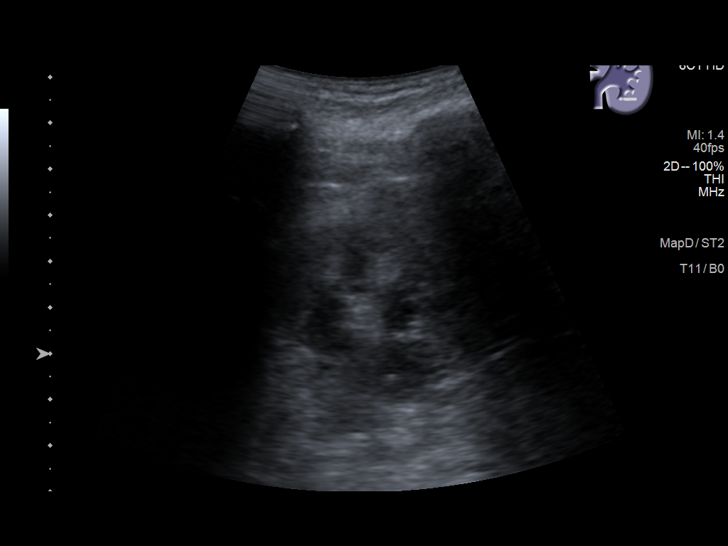
[im 39/47]
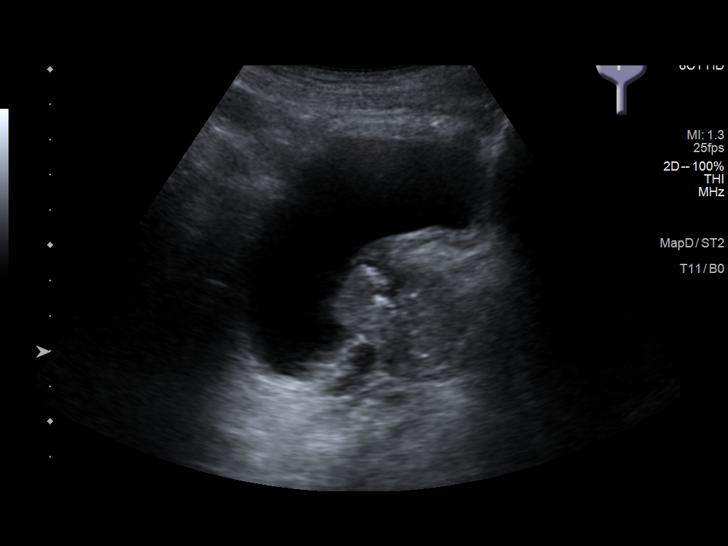
[im 43/47]
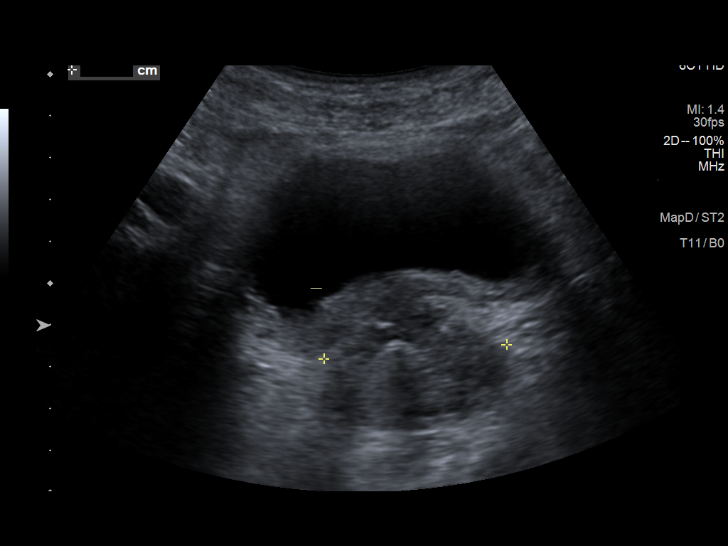
[im 47/47]
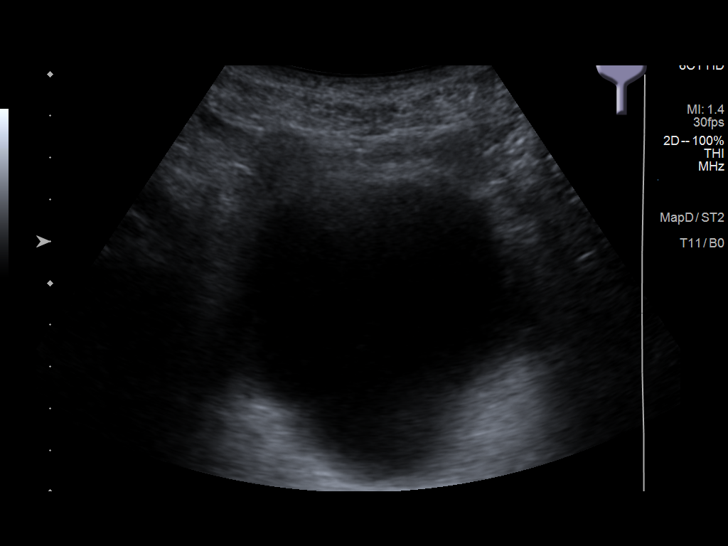

[14 of 25 positions shown; findings below may reference images not displayed]

FINDINGS: Right Kidney:

Renal measurements: 9.7 x 4.8 x 4.3 cm. = volume: 104 mL. Cysts are
noted within the right kidney. The largest of these measures 1.7 cm.
Less than 10 cysts are identified.

Left Kidney:

Renal measurements: 9.4 x 5.2 x 3.3 cm. = volume: 84 mL. Two cysts
are noted within the left kidney. The largest of these measures
cm.

Bladder:

Appears normal for degree of bladder distention.
IMPRESSION: Bilateral renal cysts without complicating factors.

No obstructive changes are seen.

## 2019-02-20 NOTE — Progress Notes (Signed)
Called patient and he was not home

## 2019-02-21 ENCOUNTER — Inpatient Hospital Stay (HOSPITAL_BASED_OUTPATIENT_CLINIC_OR_DEPARTMENT_OTHER): Payer: Medicare HMO | Admitting: Internal Medicine

## 2019-02-21 ENCOUNTER — Inpatient Hospital Stay: Payer: Medicare HMO

## 2019-02-21 ENCOUNTER — Other Ambulatory Visit: Payer: Self-pay

## 2019-02-21 ENCOUNTER — Encounter: Payer: Self-pay | Admitting: Internal Medicine

## 2019-02-21 ENCOUNTER — Inpatient Hospital Stay: Payer: Medicare HMO | Attending: Internal Medicine

## 2019-02-21 DIAGNOSIS — Z7982 Long term (current) use of aspirin: Secondary | ICD-10-CM | POA: Diagnosis not present

## 2019-02-21 DIAGNOSIS — Z87891 Personal history of nicotine dependence: Secondary | ICD-10-CM | POA: Insufficient documentation

## 2019-02-21 DIAGNOSIS — D631 Anemia in chronic kidney disease: Secondary | ICD-10-CM | POA: Diagnosis not present

## 2019-02-21 DIAGNOSIS — I10 Essential (primary) hypertension: Secondary | ICD-10-CM | POA: Diagnosis not present

## 2019-02-21 DIAGNOSIS — E032 Hypothyroidism due to medicaments and other exogenous substances: Secondary | ICD-10-CM | POA: Insufficient documentation

## 2019-02-21 DIAGNOSIS — Z7189 Other specified counseling: Secondary | ICD-10-CM

## 2019-02-21 DIAGNOSIS — Z79899 Other long term (current) drug therapy: Secondary | ICD-10-CM | POA: Insufficient documentation

## 2019-02-21 DIAGNOSIS — C678 Malignant neoplasm of overlapping sites of bladder: Secondary | ICD-10-CM

## 2019-02-21 DIAGNOSIS — N184 Chronic kidney disease, stage 4 (severe): Secondary | ICD-10-CM | POA: Insufficient documentation

## 2019-02-21 DIAGNOSIS — Z5112 Encounter for antineoplastic immunotherapy: Secondary | ICD-10-CM | POA: Diagnosis present

## 2019-02-21 LAB — COMPREHENSIVE METABOLIC PANEL
ALT: 21 U/L (ref 0–44)
AST: 51 U/L — ABNORMAL HIGH (ref 15–41)
Albumin: 4.2 g/dL (ref 3.5–5.0)
Alkaline Phosphatase: 62 U/L (ref 38–126)
Anion gap: 8 (ref 5–15)
BUN: 29 mg/dL — ABNORMAL HIGH (ref 8–23)
CO2: 23 mmol/L (ref 22–32)
Calcium: 9.1 mg/dL (ref 8.9–10.3)
Chloride: 108 mmol/L (ref 98–111)
Creatinine, Ser: 2.07 mg/dL — ABNORMAL HIGH (ref 0.61–1.24)
GFR calc Af Amer: 33 mL/min — ABNORMAL LOW (ref >60)
GFR calc non Af Amer: 29 mL/min — ABNORMAL LOW (ref >60)
Glucose, Bld: 95 mg/dL (ref 70–99)
Potassium: 4.5 mmol/L (ref 3.5–5.1)
Sodium: 139 mmol/L (ref 135–145)
Total Bilirubin: 0.8 mg/dL (ref 0.3–1.2)
Total Protein: 6.8 g/dL (ref 6.5–8.1)

## 2019-02-21 LAB — CBC WITH DIFFERENTIAL/PLATELET
Abs Immature Granulocytes: 0.01 10*3/uL (ref 0.00–0.07)
Basophils Absolute: 0.1 10*3/uL (ref 0.0–0.1)
Basophils Relative: 1 %
Eosinophils Absolute: 0.7 10*3/uL — ABNORMAL HIGH (ref 0.0–0.5)
Eosinophils Relative: 10 %
HCT: 30 % — ABNORMAL LOW (ref 39.0–52.0)
Hemoglobin: 10.2 g/dL — ABNORMAL LOW (ref 13.0–17.0)
Immature Granulocytes: 0 %
Lymphocytes Relative: 37 %
Lymphs Abs: 2.5 10*3/uL (ref 0.7–4.0)
MCH: 33.9 pg (ref 26.0–34.0)
MCHC: 34 g/dL (ref 30.0–36.0)
MCV: 99.7 fL (ref 80.0–100.0)
Monocytes Absolute: 0.6 10*3/uL (ref 0.1–1.0)
Monocytes Relative: 8 %
Neutro Abs: 3.1 10*3/uL (ref 1.7–7.7)
Neutrophils Relative %: 44 %
Platelets: 212 10*3/uL (ref 150–400)
RBC: 3.01 MIL/uL — ABNORMAL LOW (ref 4.22–5.81)
RDW: 12.4 % (ref 11.5–15.5)
WBC: 7 10*3/uL (ref 4.0–10.5)
nRBC: 0 % (ref 0.0–0.2)

## 2019-02-21 MED ORDER — SODIUM CHLORIDE 0.9 % IV SOLN
1200.0000 mg | Freq: Once | INTRAVENOUS | Status: AC
  Administered 2019-02-21: 1200 mg via INTRAVENOUS
  Filled 2019-02-21: qty 20

## 2019-02-21 MED ORDER — SODIUM CHLORIDE 0.9 % IV SOLN
Freq: Once | INTRAVENOUS | Status: AC
  Administered 2019-02-21: 09:00:00 via INTRAVENOUS
  Filled 2019-02-21: qty 250

## 2019-02-21 MED ORDER — LEVOTHYROXINE SODIUM 88 MCG PO TABS: 88.0000 ug | ORAL_TABLET | Freq: Every day | ORAL | 3 refills | Status: DC

## 2019-02-21 NOTE — Assessment & Plan Note (Addendum)
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  Stage IV;  July, 6th 2020- CT- NED;  thickening of bladder. STABLE.   # on Tecentiq; Labs today reviewed;  acceptable for treatment today. Will order scans at next visit.  # HTN- poorly controlled- 160s/ -; at home 120-130s;  STABLE.  # Iatrogenic hypothyroidism-on Synthroid 88 mcg; refilled today-STABLE.   # Anemia sec to CKD/ on Iv iron.hb-10.2STABLE>   # CKD stage III-IV- STABLE.   #Spoke to patient's son Ronalee Belts over the phone updated about father's plan of care.   # DISPOSITION:  # treatment today. # Follow-up in 3 weeks-MD labs-cbc/cmp/Thyroid profile-Tecentriq IV-Dr.B

## 2019-02-21 NOTE — Progress Notes (Signed)
[facility] CONSULT NOTE  Patient Care Team: [name], MD as PCP - General (Internal Medicine)  CHIEF COMPLAINTS/PURPOSE OF CONSULTATION: Bladder cancer  #  Oncology History Overview Note  # AUG 2019-TRANSITIONAL CELL BLADDER CA [~ 4cm tumor] s/p cystoscopy [Dr.Stoiff]  with extensive angiolymphatic invasion; [name]; [name]; mediastinal uptake; right pubic rami uptake.  # 19th ep 2019- Tecentriq;    # Match 2020- HYPOTHYROIDISM [sec to Tecen]  # CKD stage III-IV [creat 2.5]; [name]; NO other targets**  DIAGNOSIS: Bladder ca  STAGE:   IV  ;GOALS: palliative  CURRENT/MOST RECENT THERAPY:Tecentriq      Cancer of overlapping sites of bladder ([place])  02/28/2018 -  Chemotherapy   The patient had atezolizumab (TECENTRIQ) 1,[name], 1,200 mg, Intravenous, Once, 16 of 17 cycles Administration: 1,200 mg (02/28/2018), 1,200 mg (03/21/2018), 1,200 mg (04/11/2018), 1,200 mg (05/02/2018), 1,200 mg (06/13/2018), 1,200 mg (05/23/2018), 1,200 mg (07/04/2018), 1,200 mg (07/25/2018), 1,200 mg (08/15/2018), 1,200 mg (09/05/2018), 1,200 mg (10/25/2018), 1,200 mg (11/22/2018), 1,200 mg (12/20/2018), 1,200 mg (01/10/2019), 1,200 mg (01/31/2019), 1,200 mg (02/21/2019)  for chemotherapy treatment.       HISTORY OF PRESENTING ILLNESS: Edward Cummings 85 y.o.  male with metastatic transitional carcinoma of the bladder currently on Tecentriq is here for follow-up.  Patient denies any blood in stools or blood in urine.  His appetite is improving.  Weight is stable.  No nausea no vomiting.  No diarrhea.  Review of Systems  Constitutional: Positive for malaise/fatigue. Negative for chills, diaphoresis and fever.  HENT: Negative for  nosebleeds and sore throat.   Eyes: Negative for double vision.  Respiratory: Negative for cough, hemoptysis, sputum production, shortness of breath and wheezing.   Cardiovascular: Negative for chest pain, palpitations, orthopnea and leg swelling.  Gastrointestinal: Negative for abdominal pain, blood in stool, diarrhea, heartburn, melena, nausea and vomiting.  Genitourinary: Negative for dysuria, frequency and urgency.  Musculoskeletal: Positive for back pain. Negative for joint pain.  Skin: Negative.  Negative for itching and rash.  Neurological: Negative for tingling, focal weakness, weakness and headaches.  Endo/Heme/Allergies: Does not bruise/bleed easily.  Psychiatric/Behavioral: Negative for depression. The patient is not nervous/anxious and does not have insomnia.      MEDICAL HISTORY:  Past Medical History:  Diagnosis Date  . Anemia   . Cancer ([place])    bladder  . Chronic kidney disease   . Depression   . Hypertension   . Neuromuscular disorder ([place])    Nerve damage to left face/eye since around 2002.    SURGICAL HISTORY: Past Surgical History:  Procedure Laterality Date  . CYSTOSCOPY W/ RETROGRADES Bilateral 01/25/2018   Procedure: CYSTOSCOPY WITH RETROGRADE PYELOGRAM;  [name], MD;  [name];  Service: Urology;  Laterality: Bilateral;  . CYSTOSCOPY W/ URETERAL STENT PLACEMENT Bilateral 01/06/2019   Procedure: CYSTOSCOPY WITH RETROGRADE PYELOGRAM/URETERAL STENT REMOVAL;  [name], MD;  [name];  Service: Urology;  Laterality: Bilateral;  . CYSTOSCOPY WITH STENT PLACEMENT Bilateral 01/25/2018   Procedure: CYSTOSCOPY WITH STENT PLACEMENT;  [name], MD;  [name];  Service: Urology;  Laterality: Bilateral;  . DORSAL SLIT N/A 01/06/2019   Procedure: DORSAL SLIT;  [name], MD;  Location:  [facility];  Service: Urology;  [name];  . EYE SURGERY     Cornea transplants  bilaterally & cataract surgery.  . TRANSURETHRAL RESECTION OF BLADDER TUMOR N/A 01/25/2018   Procedure: TRANSURETHRAL RESECTION OF BLADDER TUMOR (TURBT);  [name], MD;  [name];  Service: Urology;  [name];    SOCIAL HISTORY: lives in [place]; with wife; quit smoking 18 years ago; beer every 2 months or so.mechanic/retd.   Social History   Socioeconomic History  . Marital status: Married    Spouse name: [name]  . Number of children: Not on file  . Years of education: Not on file  . Highest education level: Not on file  Occupational History  . Not on file  Social Needs  . Financial resource strain: Not hard at all  . Food insecurity    Worry: Never true    Inability: Never true  . Transportation needs    Medical: Not on file    Non-medical: Not on file  Tobacco Use  . Smoking status: Former Research scientist (life sciences)  . Smokeless tobacco: Current User    Types: Chew  . Tobacco comment: Stopped approximately 10 years ago.  Substance and Sexual Activity  . Alcohol use: Yes    Alcohol/week: 2.0 standard drinks    Types: 2 Cans of beer per week    Comment: Daily  . Drug use: Never  . Sexual activity: Yes    Birth control/protection: None  Lifestyle  . Physical activity    Days per week: Not on file    Minutes per session: Not on file  . Stress: Only a little  Relationships  . Social Herbalist on phone: Not on file    Gets together: Not on file    Attends religious service: Not on file    Active member of club or organization: Not on file    Attends meetings of clubs or organizations: Not on file    Relationship status: Not on file  . Intimate partner violence    Fear of current or ex partner: Not on file    Emotionally abused: Not on file    Physically abused: Not on file    Forced sexual activity: Not on file  Other Topics Concern  . Not on file  Social History Narrative  . Not on file    FAMILY HISTORY: Family History   Problem Relation Age of Onset  . Prostate cancer Neg Hx   . Kidney cancer Neg Hx   . Bladder Cancer Neg Hx     ALLERGIES:  has No Known Allergies.  MEDICATIONS:  Current Outpatient Medications  Medication Sig Dispense Refill  . amLODipine (NORVASC) 5 MG tablet Take 1 tablet (5 mg total) by mouth daily. 30 tablet 3  . aspirin EC 81 MG tablet Take 81 mg by mouth daily.     Marland Kitchen docusate sodium (COLACE) 50 MG capsule Take 50 mg by mouth at bedtime.    . fexofenadine (ALLEGRA) 180 MG tablet Take 180 mg by mouth daily.    . fluticasone (FLONASE) 50 MCG/ACT nasal spray Place 2 sprays into both nostrils daily as needed for allergies or rhinitis.    Marland Kitchen HYDROcodone-acetaminophen (NORCO/VICODIN) 5-325 MG tablet Take 1 tablet by mouth every 8 (eight) hours as needed for moderate pain. 30 tablet 0  . levothyroxine (SYNTHROID) 88 MCG tablet Take 1 tablet (88 mcg total) by mouth daily before breakfast. Empty stomach- 1 hour prior to breakfast.  30 tablet 3  . Multiple Vitamin (MULTIVITAMIN WITH MINERALS) TABS tablet Take 1 tablet by mouth daily.     . prednisoLONE acetate (PRED FORTE) 1 % ophthalmic suspension Place 1 drop into both eyes daily.    . tamsulosin (FLOMAX) 0.4 MG CAPS capsule Take 1 capsule (0.4 mg total) by mouth at bedtime. 90 capsule 1   No current facility-administered medications for this visit.       Marland Kitchen  PHYSICAL EXAMINATION: ECOG PERFORMANCE STATUS: 1 - Symptomatic but completely ambulatory  Vitals:   02/21/19 0839  BP: (!) 164/68  Pulse: 66  Resp: 18  Temp: 98.2 F (36.8 C)   Filed Weights   02/21/19 0839  Weight: 126 lb 3.2 oz (57.2 kg)    Physical Exam  Constitutional: He is oriented to person, place, and time.  Alone.   Walking himself.  Thin built moderately nourished male patient.  HENT:  Head: Normocephalic and atraumatic.  Mouth/Throat: Oropharynx is clear and moist. No oropharyngeal exudate.  Eyes: Pupils are equal, round, and reactive to light.  Chronic  drooping of the left eyelid.  Neck: Normal range of motion. Neck supple.  Cardiovascular: Normal rate and regular rhythm.  Pulmonary/Chest: No respiratory distress. He has no wheezes.  Abdominal: Soft. Bowel sounds are normal. He exhibits no distension and no mass. There is no abdominal tenderness. There is no rebound and no guarding.  Musculoskeletal: Normal range of motion.        General: No tenderness or edema.  Neurological: He is alert and oriented to person, place, and time.  Skin: Skin is warm.  Psychiatric: Affect normal.     LABORATORY DATA:  I have reviewed the data as listed Lab Results  Component Value Date   WBC 7.0 02/21/2019   HGB 10.2 (L) 02/21/2019   HCT 30.0 (L) 02/21/2019   MCV 99.7 02/21/2019   PLT 212 02/21/2019   Recent Labs    01/10/19 0830 01/31/19 0825 02/21/19 0756  NA 141 139 139  K 4.8 4.0 4.5  CL 109 108 108  CO2 _0 GLUCOSE 138* 121* 95  BUN 31* 30* 29*  CREATININE 2.11* 2.20* 2.07*  CALCIUM 9.2 9.1 9.1  GFRNONAA 28* 27* 29*  GFRAA 32* 31* 33*  PROT 6.9 7.2 6.8  ALBUMIN 3.9 4.1 4.2  AST 21 22 51*  ALT _1 ALKPHOS 73 68 62  BILITOT 0.7 0.8 0.8    RADIOGRAPHIC STUDIES: I have personally reviewed the radiological images as listed and agreed with the findings in the report. Ultrasound Renal Complete  Result Date: 02/21/2019 CLINICAL DATA:  Known bladder carcinoma EXAM: RENAL / URINARY TRACT ULTRASOUND COMPLETE COMPARISON:  12/16/2018 FINDINGS: Right Kidney: Renal measurements: 9.7 x 4.8 x 4.3 cm. = volume: 104 mL. Cysts are noted within the right kidney. The largest of these measures 1.7 cm. Less than 10 cysts are identified. Left Kidney: Renal measurements: 9.4 x 5.2 x 3.3 cm. = volume: 84 mL. Two cysts are noted within the left kidney. The largest of these measures 1.8 cm. Bladder: Appears normal for degree of bladder distention. IMPRESSION: Bilateral renal cysts without complicating factors. No obstructive changes are seen.  Electronically Signed   By: Inez Catalina M.D.   On: 02/21/2019 08:29    ASSESSMENT & PLAN:   Cancer of overlapping sites of bladder ([place]) # High-grade transitional cell carcinoma of the bladder metastatic to retroperitoneal lymph node.  Stage IV;  July, 6th 2020- CT- NED;  thickening of bladder. STABLE.   # on Tecentiq; Labs today reviewed;  acceptable for treatment today. Will order scans at next visit.  # HTN- poorly controlled- 160s/ -; at home 120-130s;  STABLE.  # Iatrogenic hypothyroidism-on Synthroid 88 mcg; refilled today-STABLE.   # Anemia sec to CKD/ on Iv iron.hb-10.2STABLE>   # CKD stage III-IV- STABLE.   #Spoke to patient's son Ronalee Belts over the phone updated about father's plan of care.   # DISPOSITION:  # treatment today. # Follow-up in 3 weeks-MD labs-cbc/cmp/Thyroid profile-Tecentriq IV-Dr.B  All questions were answered. The patient knows to call the clinic with any problems, questions or concerns.    Cammie Sickle, MD 02/22/2019 7:26 AM

## 2019-02-24 ENCOUNTER — Telehealth: Payer: Self-pay | Admitting: *Deleted

## 2019-02-24 NOTE — Telephone Encounter (Addendum)
Patient informed-verbalized understanding.  ----- Message from Abbie Sons, MD sent at 02/22/2019 12:45 PM EDT ----- Renal ultrasound showed no evidence of recurrent kidney blockage.  Follow-up as scheduled

## 2019-02-27 DIAGNOSIS — C679 Malignant neoplasm of bladder, unspecified: Secondary | ICD-10-CM | POA: Diagnosis not present

## 2019-02-27 DIAGNOSIS — D509 Iron deficiency anemia, unspecified: Secondary | ICD-10-CM | POA: Diagnosis not present

## 2019-02-27 DIAGNOSIS — R6251 Failure to thrive (child): Secondary | ICD-10-CM | POA: Diagnosis not present

## 2019-02-27 DIAGNOSIS — R0602 Shortness of breath: Secondary | ICD-10-CM | POA: Diagnosis not present

## 2019-03-04 DIAGNOSIS — R6251 Failure to thrive (child): Secondary | ICD-10-CM | POA: Diagnosis not present

## 2019-03-04 DIAGNOSIS — R0602 Shortness of breath: Secondary | ICD-10-CM | POA: Diagnosis not present

## 2019-03-04 DIAGNOSIS — C679 Malignant neoplasm of bladder, unspecified: Secondary | ICD-10-CM | POA: Diagnosis not present

## 2019-03-04 DIAGNOSIS — D509 Iron deficiency anemia, unspecified: Secondary | ICD-10-CM | POA: Diagnosis not present

## 2019-03-13 NOTE — Progress Notes (Signed)
Patient is coming in for follow up he is doing well no complaints  

## 2019-03-14 ENCOUNTER — Other Ambulatory Visit: Payer: Self-pay

## 2019-03-14 ENCOUNTER — Inpatient Hospital Stay: Payer: Medicare HMO | Attending: Internal Medicine

## 2019-03-14 ENCOUNTER — Inpatient Hospital Stay (HOSPITAL_BASED_OUTPATIENT_CLINIC_OR_DEPARTMENT_OTHER): Payer: Medicare HMO | Admitting: Internal Medicine

## 2019-03-14 ENCOUNTER — Inpatient Hospital Stay: Payer: Medicare HMO

## 2019-03-14 VITALS — BP 163/80 | HR 48

## 2019-03-14 DIAGNOSIS — I10 Essential (primary) hypertension: Secondary | ICD-10-CM | POA: Insufficient documentation

## 2019-03-14 DIAGNOSIS — D631 Anemia in chronic kidney disease: Secondary | ICD-10-CM | POA: Diagnosis not present

## 2019-03-14 DIAGNOSIS — Z7189 Other specified counseling: Secondary | ICD-10-CM

## 2019-03-14 DIAGNOSIS — Z5112 Encounter for antineoplastic immunotherapy: Secondary | ICD-10-CM | POA: Diagnosis not present

## 2019-03-14 DIAGNOSIS — Z87891 Personal history of nicotine dependence: Secondary | ICD-10-CM | POA: Diagnosis not present

## 2019-03-14 DIAGNOSIS — Z79899 Other long term (current) drug therapy: Secondary | ICD-10-CM | POA: Diagnosis not present

## 2019-03-14 DIAGNOSIS — N184 Chronic kidney disease, stage 4 (severe): Secondary | ICD-10-CM | POA: Diagnosis not present

## 2019-03-14 DIAGNOSIS — C678 Malignant neoplasm of overlapping sites of bladder: Secondary | ICD-10-CM

## 2019-03-14 DIAGNOSIS — E039 Hypothyroidism, unspecified: Secondary | ICD-10-CM | POA: Diagnosis not present

## 2019-03-14 DIAGNOSIS — Z9221 Personal history of antineoplastic chemotherapy: Secondary | ICD-10-CM | POA: Diagnosis not present

## 2019-03-14 DIAGNOSIS — C772 Secondary and unspecified malignant neoplasm of intra-abdominal lymph nodes: Secondary | ICD-10-CM | POA: Diagnosis not present

## 2019-03-14 DIAGNOSIS — Z7982 Long term (current) use of aspirin: Secondary | ICD-10-CM | POA: Insufficient documentation

## 2019-03-14 LAB — CBC WITH DIFFERENTIAL/PLATELET
Abs Immature Granulocytes: 0.01 10*3/uL (ref 0.00–0.07)
Basophils Absolute: 0.1 10*3/uL (ref 0.0–0.1)
Basophils Relative: 1 %
Eosinophils Absolute: 0.8 10*3/uL — ABNORMAL HIGH (ref 0.0–0.5)
Eosinophils Relative: 13 %
HCT: 29.2 % — ABNORMAL LOW (ref 39.0–52.0)
Hemoglobin: 9.7 g/dL — ABNORMAL LOW (ref 13.0–17.0)
Immature Granulocytes: 0 %
Lymphocytes Relative: 29 %
Lymphs Abs: 1.7 10*3/uL (ref 0.7–4.0)
MCH: 33.2 pg (ref 26.0–34.0)
MCHC: 33.2 g/dL (ref 30.0–36.0)
MCV: 100 fL (ref 80.0–100.0)
Monocytes Absolute: 0.5 10*3/uL (ref 0.1–1.0)
Monocytes Relative: 8 %
Neutro Abs: 3 10*3/uL (ref 1.7–7.7)
Neutrophils Relative %: 49 %
Platelets: 199 10*3/uL (ref 150–400)
RBC: 2.92 MIL/uL — ABNORMAL LOW (ref 4.22–5.81)
RDW: 12.4 % (ref 11.5–15.5)
WBC: 6 10*3/uL (ref 4.0–10.5)
nRBC: 0 % (ref 0.0–0.2)

## 2019-03-14 LAB — COMPREHENSIVE METABOLIC PANEL
ALT: 16 U/L (ref 0–44)
AST: 23 U/L (ref 15–41)
Albumin: 4.1 g/dL (ref 3.5–5.0)
Alkaline Phosphatase: 69 U/L (ref 38–126)
Anion gap: 6 (ref 5–15)
BUN: 28 mg/dL — ABNORMAL HIGH (ref 8–23)
CO2: 24 mmol/L (ref 22–32)
Calcium: 9.1 mg/dL (ref 8.9–10.3)
Chloride: 109 mmol/L (ref 98–111)
Creatinine, Ser: 2.03 mg/dL — ABNORMAL HIGH (ref 0.61–1.24)
GFR calc Af Amer: 34 mL/min — ABNORMAL LOW (ref >60)
GFR calc non Af Amer: 29 mL/min — ABNORMAL LOW (ref >60)
Glucose, Bld: 92 mg/dL (ref 70–99)
Potassium: 4.9 mmol/L (ref 3.5–5.1)
Sodium: 139 mmol/L (ref 135–145)
Total Bilirubin: 0.8 mg/dL (ref 0.3–1.2)
Total Protein: 7 g/dL (ref 6.5–8.1)

## 2019-03-14 MED ORDER — SODIUM CHLORIDE 0.9 % IV SOLN
1200.0000 mg | Freq: Once | INTRAVENOUS | Status: AC
  Administered 2019-03-14: 1200 mg via INTRAVENOUS
  Filled 2019-03-14: qty 20

## 2019-03-14 MED ORDER — SODIUM CHLORIDE 0.9 % IV SOLN
Freq: Once | INTRAVENOUS | Status: AC
  Administered 2019-03-14: 10:00:00 via INTRAVENOUS
  Filled 2019-03-14: qty 250

## 2019-03-14 MED ORDER — LEVOTHYROXINE SODIUM 88 MCG PO TABS: 88.0000 ug | ORAL_TABLET | Freq: Every day | ORAL | 3 refills | Status: DC

## 2019-03-14 NOTE — Progress Notes (Signed)
[facility] CONSULT NOTE  Patient Care Team: Edward Athens, MD as PCP - General (Internal Medicine)  CHIEF COMPLAINTS/PURPOSE OF CONSULTATION: Bladder cancer   Oncology History Overview Note  # AUG 2019-TRANSITIONAL CELL BLADDER CA [~ 4cm tumor] s/p cystoscopy [Dr.Stoiff]  with extensive angiolymphatic invasion; [name]; [name]; mediastinal uptake; right pubic rami uptake.  # 19th ep 2019- Tecentriq;    # Match 2020- HYPOTHYROIDISM [sec to T[facility]en]  # CKD stage III-IV [creat 2.5]; July 2020 cystoscopy-no evidence of bladder malignancy/Dr. Bernardo Cummings  # Molecular testing- PDL-1 CPS- 20%; NO other targets**  DIAGNOSIS: Bladder ca  STAGE:   IV  ;GOALS: palliative  CURRENT/MOST RECENT THERAPY:Tecentriq      Cancer of overlapping sites of bladder ([place])  02/28/2018 -  Chemotherapy   The patient had atezolizumab (TECENTRIQ) 1,[name], 1,200 mg, Intravenous, Once, 17 of 19 cycles Administration: 1,200 mg (02/28/2018), 1,200 mg (03/21/2018), 1,200 mg (04/11/2018), 1,200 mg (05/02/2018), 1,200 mg (06/13/2018), 1,200 mg (05/23/2018), 1,200 mg (07/04/2018), 1,200 mg (07/25/2018), 1,200 mg (08/15/2018), 1,200 mg (09/05/2018), 1,200 mg (10/25/2018), 1,200 mg (11/22/2018), 1,200 mg (12/20/2018), 1,200 mg (01/10/2019), 1,200 mg (01/31/2019), 1,200 mg (02/21/2019), 1,200 mg (03/14/2019)  for chemotherapy treatment.       HISTORY OF PRESENTING ILLNESS: Edward Cummings 85 y.o.  male with metastatic transitional carcinoma of the bladder currently on Tecentriq is here for follow-up.  Patient denies any nausea vomiting.  Denies any blood in stools blood in urine.  Appetite is fair.  No weight loss.  No headaches.  No skin rash.   Review of Systems  Constitutional: Positive for malaise/fatigue. Negative for chills, diaphoresis and  fever.  HENT: Negative for nosebleeds and sore throat.   Eyes: Negative for double vision.  Respiratory: Negative for cough, hemoptysis, sputum production, shortness of breath and wheezing.   Cardiovascular: Negative for chest pain, palpitations, orthopnea and leg swelling.  Gastrointestinal: Negative for abdominal pain, blood in stool, diarrhea, heartburn, melena, nausea and vomiting.  Genitourinary: Negative for dysuria, frequency and urgency.  Musculoskeletal: Positive for back pain. Negative for joint pain.  Skin: Negative.  Negative for itching and rash.  Neurological: Negative for tingling, focal weakness, weakness and headaches.  Endo/Heme/Allergies: Does not bruise/bleed easily.  Psychiatric/Behavioral: Negative for depression. The patient is not nervous/anxious and does not have insomnia.      MEDICAL HISTORY:  Past Medical History:  Diagnosis Date  . Anemia   . Cancer ([place])    bladder  . Chronic kidney disease   . Depression   . Hypertension   . Neuromuscular disorder ([place])    Nerve damage to left face/eye since around 2002.    SURGICAL HISTORY: Past Surgical History:  Procedure Laterality Date  . CYSTOSCOPY W/ RETROGRADES Bilateral 01/25/2018   Procedure: CYSTOSCOPY WITH RETROGRADE PYELOGRAM;  Surgeon: Edward Sons, MD;  [name];  Service: Urology;  Laterality: Bilateral;  . CYSTOSCOPY W/ URETERAL STENT PLACEMENT Bilateral 01/06/2019   Procedure: CYSTOSCOPY WITH RETROGRADE PYELOGRAM/URETERAL STENT REMOVAL;  Surgeon: Edward Sons, MD;  [name];  Service: Urology;  Laterality: Bilateral;  . CYSTOSCOPY WITH STENT PLACEMENT Bilateral 01/25/2018   Procedure: CYSTOSCOPY WITH STENT PLACEMENT;  Surgeon: Edward Sons, MD;  [name];  Service: Urology;  Laterality: Bilateral;  . DORSAL SLIT N/A 01/06/2019   Procedure: DORSAL SLIT;  Surgeon: Edward Cummings,  Ronda Fairly, MD;  [name];  Service: Urology;  [name];  . EYE SURGERY      Cornea transplants bilaterally & cataract surgery.  . TRANSURETHRAL RESECTION OF BLADDER TUMOR N/A 01/25/2018   Procedure: TRANSURETHRAL RESECTION OF BLADDER TUMOR (TURBT);  Surgeon: Edward Sons, MD;  [name];  Service: Urology;  [name];    SOCIAL HISTORY: lives in [place]; with wife; quit smoking 18 years ago; beer every 2 months or so.mechanic/retd.   Social History   Socioeconomic History  . Marital status: Married    Spouse name: Edward Cummings  . Number of children: Not on file  . Years of education: Not on file  . Highest education level: Not on file  Occupational History  . Not on file  Social Needs  . Financial resource strain: Not hard at all  . Food insecurity    Worry: Never true    Inability: Never true  . Transportation needs    Medical: Not on file    Non-medical: Not on file  Tobacco Use  . Smoking status: Former Research scientist (life sciences)  . Smokeless tobacco: Current User    Types: Chew  . Tobacco comment: Stopped approximately 10 years ago.  Substance and Sexual Activity  . Alcohol use: Yes    Alcohol/week: 2.0 standard drinks    Types: 2 Cans of beer per week    Comment: Daily  . Drug use: Never  . Sexual activity: Yes    Birth control/protection: None  Lifestyle  . Physical activity    Days per week: Not on file    Minutes per session: Not on file  . Stress: Only a little  Relationships  . Social Herbalist on phone: Not on file    Gets together: Not on file    Attends religious service: Not on file    Active member of club or organization: Not on file    Attends meetings of clubs or organizations: Not on file    Relationship status: Not on file  . Intimate partner violence    Fear of current or ex partner: Not on file    Emotionally abused: Not on file    Physically abused: Not on file    Forced sexual activity: Not on file  Other Topics Concern  . Not on file  Social History Narrative  . Not on file    FAMILY  HISTORY: Family History  Problem Relation Age of Onset  . Prostate cancer Neg Hx   . Kidney cancer Neg Hx   . Bladder Cancer Neg Hx     ALLERGIES:  has No Known Allergies.  MEDICATIONS:  Current Outpatient Medications  Medication Sig Dispense Refill  . amLODipine (NORVASC) 5 MG tablet Take 1 tablet (5 mg total) by mouth daily. 30 tablet 3  . aspirin EC 81 MG tablet Take 81 mg by mouth daily.     Marland Kitchen docusate sodium (COLACE) 50 MG capsule Take 50 mg by mouth at bedtime.    . fexofenadine (ALLEGRA) 180 MG tablet Take 180 mg by mouth daily.    . fluticasone (FLONASE) 50 MCG/ACT nasal spray Place 2 sprays into both nostrils daily as needed for allergies or rhinitis.    Marland Kitchen HYDROcodone-acetaminophen (NORCO/VICODIN) 5-325 MG tablet Take 1 tablet by mouth every 8 (eight) hours as needed for moderate pain. 30 tablet 0  . levothyroxine (SYNTHROID) 88 MCG tablet Take 1 tablet (88 mcg total) by mouth daily before breakfast. Empty stomach-  1 hour prior to breakfast. 30 tablet 3  . Multiple Vitamin (MULTIVITAMIN WITH MINERALS) TABS tablet Take 1 tablet by mouth daily.     . prednisoLONE acetate (PRED FORTE) 1 % ophthalmic suspension Place 1 drop into both eyes daily.    . tamsulosin (FLOMAX) 0.4 MG CAPS capsule Take 1 capsule (0.4 mg total) by mouth at bedtime. 90 capsule 1   No current facility-administered medications for this visit.       Marland Kitchen  PHYSICAL EXAMINATION: ECOG PERFORMANCE STATUS: 1 - Symptomatic but completely ambulatory  Vitals:   03/14/19 0856  BP: (!) 165/58  Pulse: (!) 48  Temp: (!) 96.1 F (35.6 C)   Filed Weights   03/14/19 0856  Weight: 128 lb (58.1 kg)    Physical Exam  Constitutional: He is oriented to person, place, and time.  Alone.   Walking himself.  Thin built moderately nourished male patient.  HENT:  Head: Normocephalic and atraumatic.  Mouth/Throat: Oropharynx is clear and moist. No oropharyngeal exudate.  Eyes: Pupils are equal, round, and reactive to  light.  Chronic drooping of the left eyelid.  Neck: Normal range of motion. Neck supple.  Cardiovascular: Normal rate and regular rhythm.  Pulmonary/Chest: No respiratory distress. He has no wheezes.  Abdominal: Soft. Bowel sounds are normal. He exhibits no distension and no mass. There is no abdominal tenderness. There is no rebound and no guarding.  Musculoskeletal: Normal range of motion.        General: No tenderness or edema.  Neurological: He is alert and oriented to person, place, and time.  Skin: Skin is warm.  Psychiatric: Affect normal.     LABORATORY DATA:  I have reviewed the data as listed Lab Results  Component Value Date   WBC 6.0 03/14/2019   HGB 9.7 (L) 03/14/2019   HCT 29.2 (L) 03/14/2019   MCV 100.0 03/14/2019   PLT 199 03/14/2019   Recent Labs    01/31/19 0825 02/21/19 0756 03/14/19 0820  NA 139 139 139  K 4.0 4.5 4.9  CL 108 108 109  CO2 _0 GLUCOSE 121* 95 92  BUN 30* 29* 28*  CREATININE 2.20* 2.07* 2.03*  CALCIUM 9.1 9.1 9.1  GFRNONAA 27* 29* 29*  GFRAA 31* 33* 34*  PROT 7.2 6.8 7.0  ALBUMIN 4.1 4.2 4.1  AST 22 51* 23  ALT _1 ALKPHOS 68 62 69  BILITOT 0.8 0.8 0.8    RADIOGRAPHIC STUDIES: I have personally reviewed the radiological images as listed and agreed with the findings in the report. Ultrasound Renal Complete  Result Date: 02/21/2019 CLINICAL DATA:  Known bladder carcinoma EXAM: RENAL / URINARY TRACT ULTRASOUND COMPLETE COMPARISON:  12/16/2018 FINDINGS: Right Kidney: Renal measurements: 9.7 x 4.8 x 4.3 cm. = volume: 104 mL. Cysts are noted within the right kidney. The largest of these measures 1.7 cm. Less than 10 cysts are identified. Left Kidney: Renal measurements: 9.4 x 5.2 x 3.3 cm. = volume: 84 mL. Two cysts are noted within the left kidney. The largest of these measures 1.8 cm. Bladder: Appears normal for degree of bladder distention. IMPRESSION: Bilateral renal cysts without complicating factors. No obstructive  changes are seen. Electronically Signed   By: Inez Catalina M.D.   On: 02/21/2019 08:29    ASSESSMENT & PLAN:   Cancer of overlapping sites of bladder ([place]) # High-grade transitional cell carcinoma of the bladder metastatic to retroperitoneal lymph node.  Stage IV;  July, 6th 2020-  CT- NED;  thickening of bladder. Stable.   # on Tecentiq; Labs today reviewed;  acceptable for treatment today.will order CT scans today.   # HTN- poorly controlled- 160s/ -; bring log of BP at next visit.   # Iatrogenic hypothyroidism-on Synthroid 88 mcg; refilled today- stable; thyroid profile pending.   # Anemia sec to CKD/ on Iv iron.Hb-9-10- STABLE. If worse; would recommend IV iron.   # CKD stage III-IV- STABLE.   I spoke at length with the patient's family/son- regarding the patient's clinical status/plan of care.  Family agreement.    # DISPOSITION:  # treatment today. # Follow-up in 3 weeks-MD labs-cbc/cmp/Tecentriq IV; CT scans prior [c/ap]--Dr.B  All questions were answered. The patient knows to call the clinic with any problems, questions or concerns.    Cammie Sickle, MD 03/16/2019 8:46 PM

## 2019-03-14 NOTE — Assessment & Plan Note (Addendum)
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  Stage IV;  July, 6th 2020- CT- NED;  thickening of bladder. Stable.   # on Tecentiq; Labs today reviewed;  acceptable for treatment today.will order CT scans today.   # HTN- poorly controlled- 160s/ -; bring log of BP at next visit.   # Iatrogenic hypothyroidism-on Synthroid 88 mcg; refilled today- stable; thyroid profile pending.   # Anemia sec to CKD/ on Iv iron.Hb-9-10- STABLE. If worse; would recommend IV iron.   # CKD stage III-IV- STABLE.   I spoke at length with the patient's family/son- regarding the patient's clinical status/plan of care.  Family agreement.    # DISPOSITION:  # treatment today. # Follow-up in 3 weeks-MD labs-cbc/cmp/Tecentriq IV; CT scans prior [c/ap]--Dr.B

## 2019-03-31 ENCOUNTER — Ambulatory Visit
Admission: RE | Admit: 2019-03-31 | Discharge: 2019-03-31 | Disposition: A | Discharge status: Home / Self Care | Payer: Medicare HMO | Source: Ambulatory Visit | Attending: Internal Medicine | Admitting: Internal Medicine

## 2019-03-31 ENCOUNTER — Other Ambulatory Visit: Payer: Self-pay

## 2019-03-31 DIAGNOSIS — Z23 Encounter for immunization: Secondary | ICD-10-CM | POA: Diagnosis not present

## 2019-03-31 DIAGNOSIS — C678 Malignant neoplasm of overlapping sites of bladder: Secondary | ICD-10-CM | POA: Diagnosis present

## 2019-03-31 DIAGNOSIS — C679 Malignant neoplasm of bladder, unspecified: Secondary | ICD-10-CM | POA: Diagnosis not present

## 2019-03-31 IMAGING — CT CT ABD-PELV W/O CM
2 of 4 series · 15 of 46 positions shown, 17 images · non-contrast
Comparison: CT scan [DATE].

CLINICAL DATA: Restaging metastatic bladder cancer.

EXAM:
CT CHEST, ABDOMEN AND PELVIS WITHOUT CONTRAST
TECHNIQUE: Multidetector CT imaging of the chest, abdomen and pelvis was
performed following the standard protocol without IV contrast.

[Series 2: axial st · axial · 0.65mm/px · z∈[-616,-266]mm · 12 of 84 slices shown, 14 images]
[im 7/84  soft-tissue]
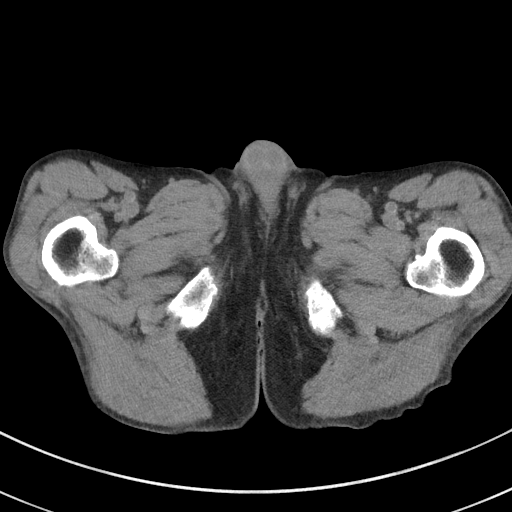
[im 7/84  bone]
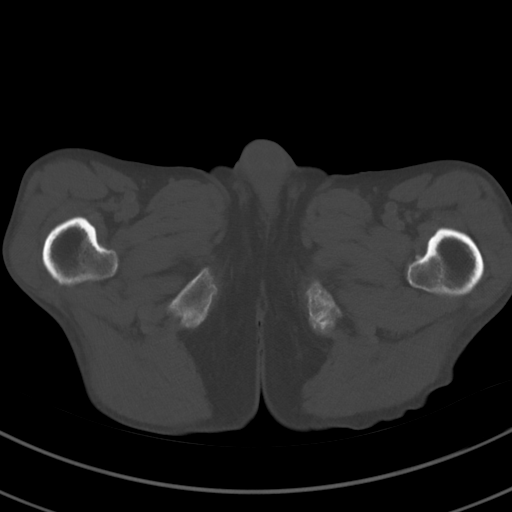
[im 13/84  soft-tissue]
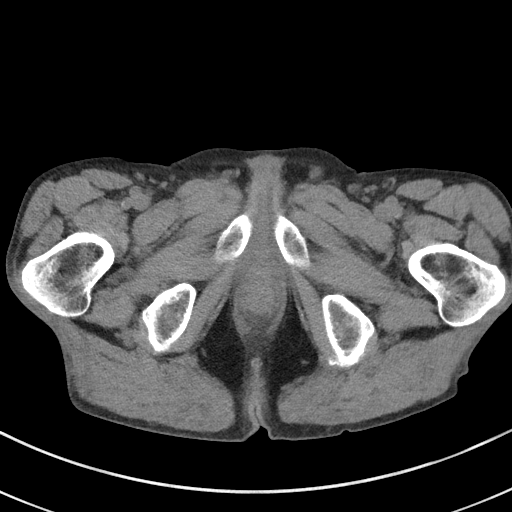
[im 20/84  soft-tissue]
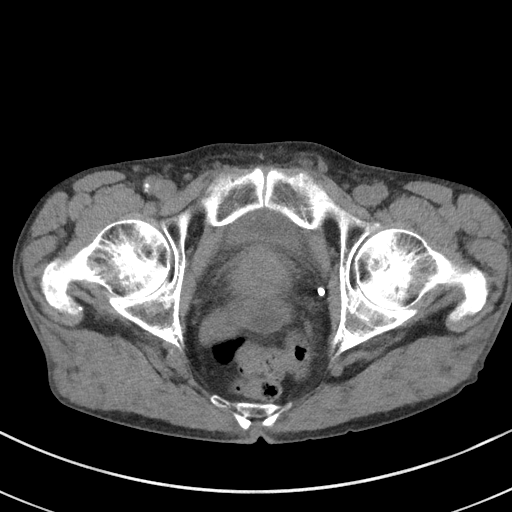
[im 26/84  soft-tissue]
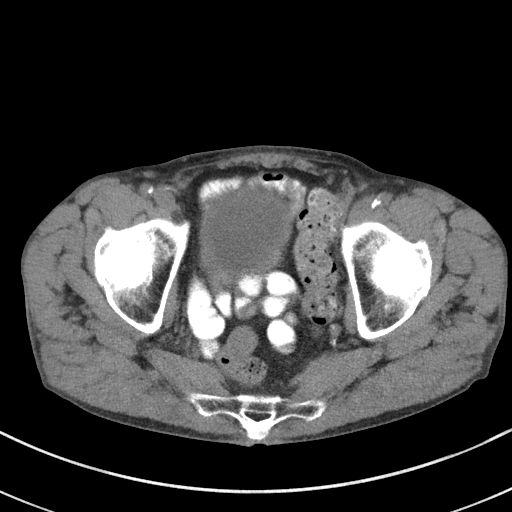
[im 32/84  soft-tissue]
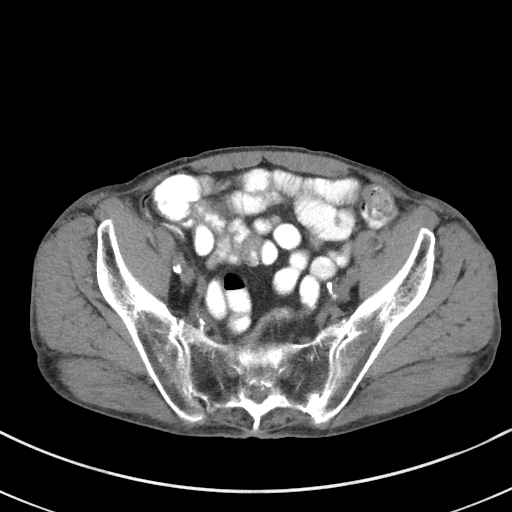
[im 39/84  soft-tissue]
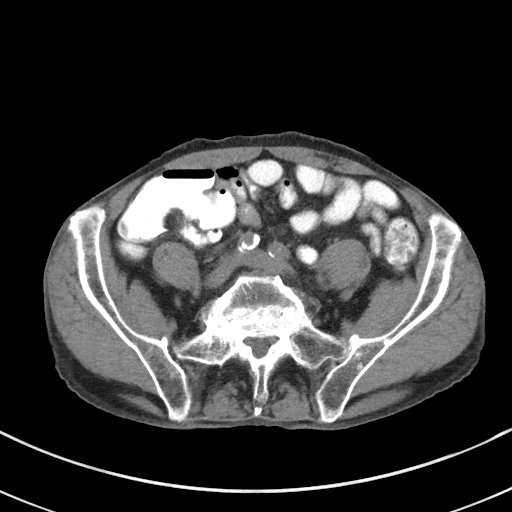
[im 45/84  soft-tissue]
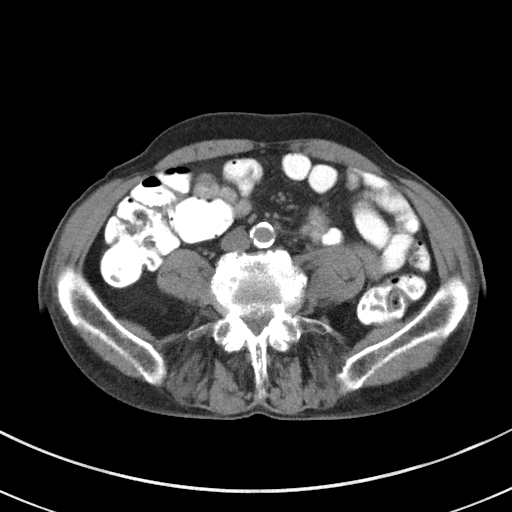
[im 52/84  soft-tissue]
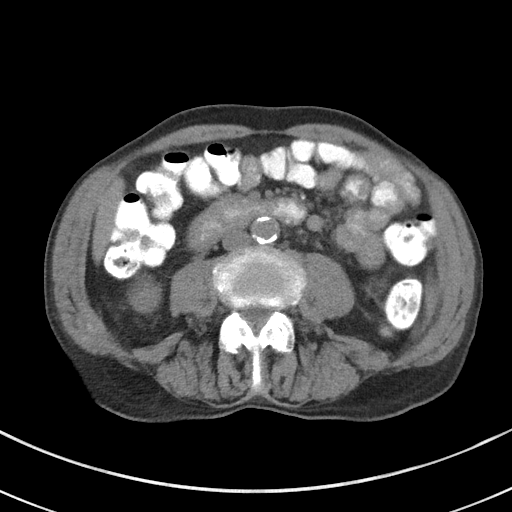
[im 58/84  soft-tissue]
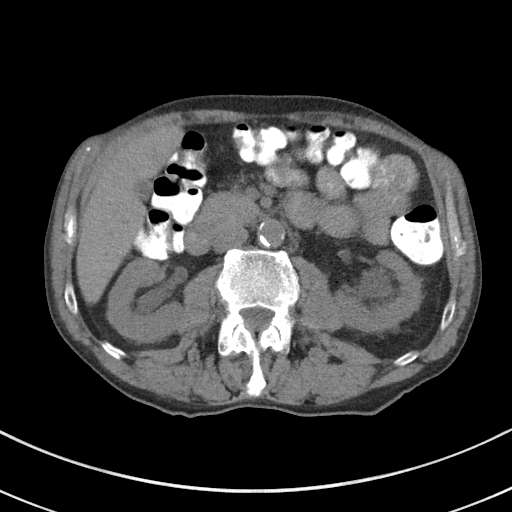
[im 58/84  bone]
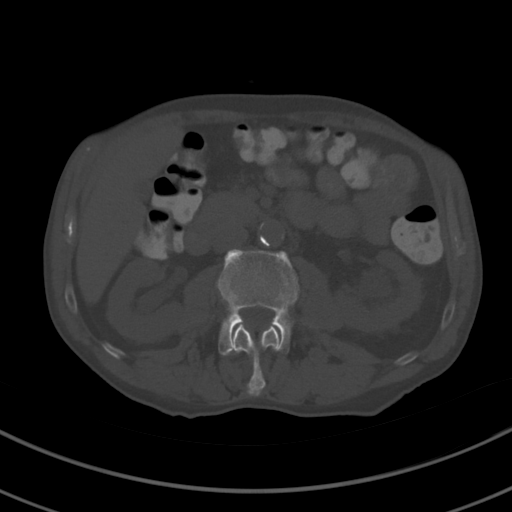
[im 64/84  soft-tissue]
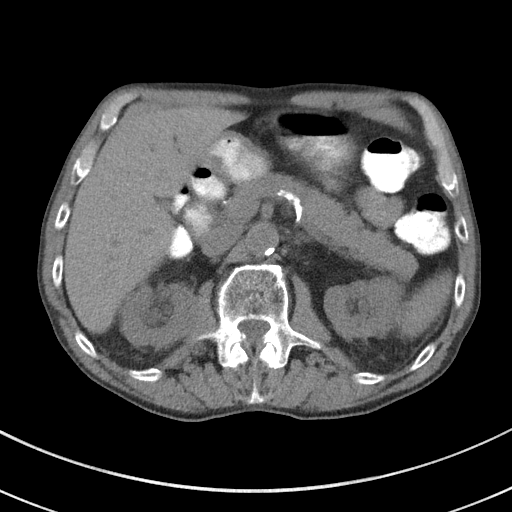
[im 71/84  soft-tissue]
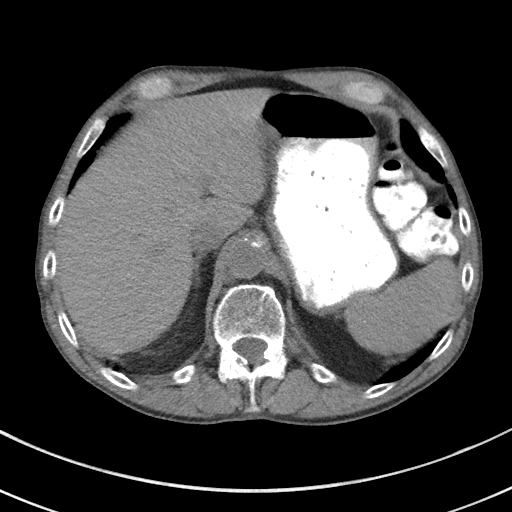
[im 77/84  soft-tissue]
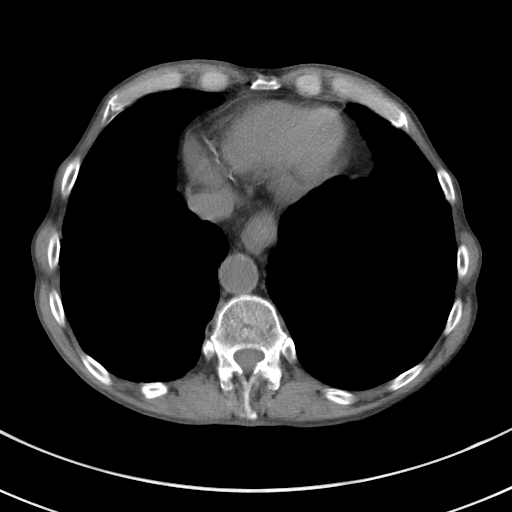

[Series 5: coronal st · coronal · 0.67mm/px · 3 of 87 slices shown]
[im 29/87  soft-tissue]
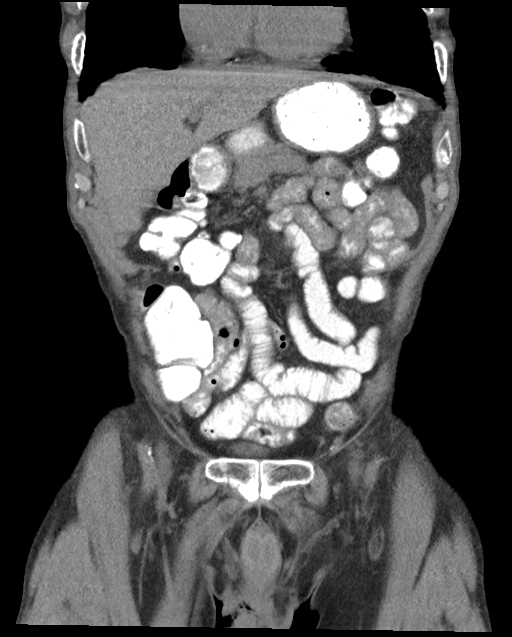
[im 39/87  soft-tissue]
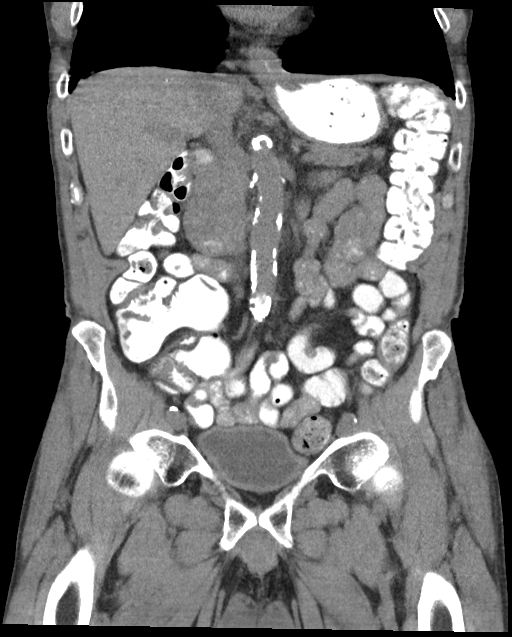
[im 48/87  soft-tissue]
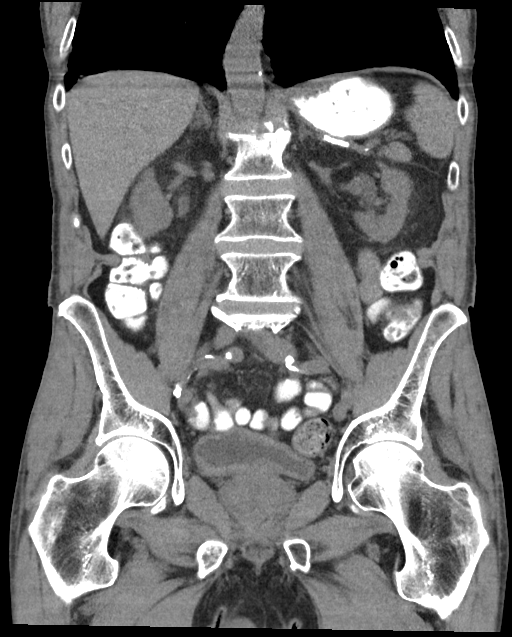

[15 of 46 positions shown; findings below may reference images not displayed]

FINDINGS: CT CHEST FINDINGS

Cardiovascular: The heart is normal in size. No pericardial
effusion. Stable advanced atherosclerotic calcifications involving
the aorta, branch vessels and coronary arteries. No aortic aneurysm.

Mediastinum/Nodes: No mediastinal or hilar mass or lymphadenopathy.
Small scattered lymph nodes are stable. The esophagus is grossly
normal.

Lungs/Pleura: Stable emphysematous changes and pulmonary scarring.

Stable calcified pleural plaques bilaterally suggesting asbestos
related pleural disease. No acute pulmonary findings or worrisome
pulmonary lesions. A few tiny scattered subpleural pulmonary nodules
are unchanged. There are also a few scattered calcified granulomas.
Basilar interstitial disease is noted bilaterally. No pleural
effusion.

Musculoskeletal: No chest wall mass, supraclavicular or axillary
adenopathy. Small scattered lymph nodes appears stable.

CT ABDOMEN PELVIS FINDINGS

Hepatobiliary: No focal hepatic lesions are identified without
contrast. No intrahepatic biliary dilatation.

Pancreas: Grossly normal without contrast. No lesions or
inflammation.

Spleen: Normal size.  No focal lesions.

Adrenals/Urinary Tract: The adrenal glands are grossly normal.

Mild bilateral hydroureteronephrosis but no obstructing ureteral
calculi. The double-J ureteral stent seen on the prior study have
been removed. There are bilateral simple appearing renal cysts and a
stable hyperdense cyst involving the midpole region of the left
kidney. No worrisome renal lesions are identified without contrast.

No definite bladder lesions.

Stomach/Bowel: The stomach, duodenum, small bowel and colon are
unremarkable. No acute inflammatory changes, mass lesions or
obstructive findings. The terminal ileum and appendix are normal.

Vascular/Lymphatic: Stable advanced atherosclerotic calcifications
involving the aorta and branch vessels but no aneurysm. No
mesenteric or retroperitoneal mass or lymphadenopathy. No pelvic
lymphadenopathy. No inguinal lymphadenopathy.

Reproductive: Mild prostate gland enlargement with median lobe
hypertrophy impressing on the base of the bladder.

Other: No pelvic mass or free pelvic fluid collections.

Musculoskeletal: No significant bony findings. No pleuritic or
sclerotic bone lesions.
IMPRESSION: 1. Stable CT appearance of the chest, abdomen and pelvis without
contrast. No findings suspicious for recurrent bladder cancer or
metastatic disease.
2. Mild bilateral hydroureteronephrosis but no obstructing calculi
or lesions.
3. Stable emphysematous changes, pulmonary scarring and calcified
pleural plaques.
4. Stable advanced atherosclerotic calcifications involving the
thoracic and abdominal aorta and branch vessels including extensive
three-vessel coronary artery calcifications.

## 2019-03-31 IMAGING — CT CT CHEST W/O CM
2 of 3 series · 15 of 36 positions shown, 18 images · non-contrast
Comparison: CT scan [DATE].

CLINICAL DATA: Restaging metastatic bladder cancer.

EXAM:
CT CHEST, ABDOMEN AND PELVIS WITHOUT CONTRAST
TECHNIQUE: Multidetector CT imaging of the chest, abdomen and pelvis was
performed following the standard protocol without IV contrast.

[Series 2: thorax · axial · 0.59mm/px · z∈[-310,-34]mm · 12 of 163 slices shown, 15 images]
[im 13/163  mediastinal]
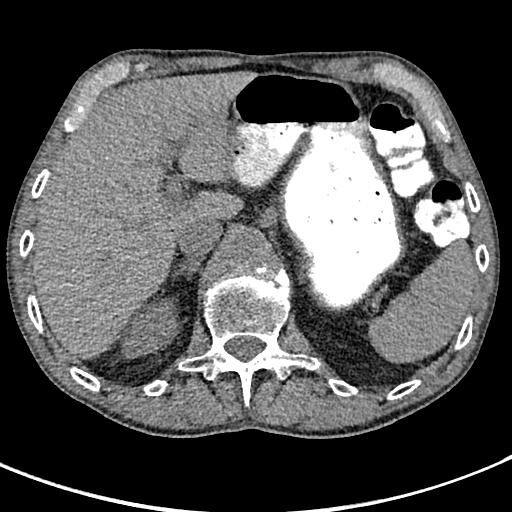
[im 13/163  lung]
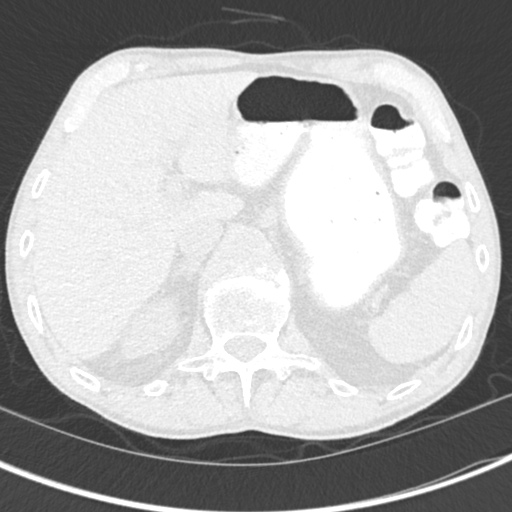
[im 25/163  lung]
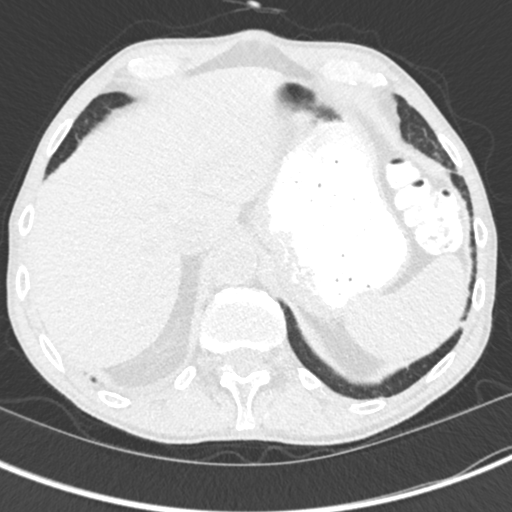
[im 37/163  lung]
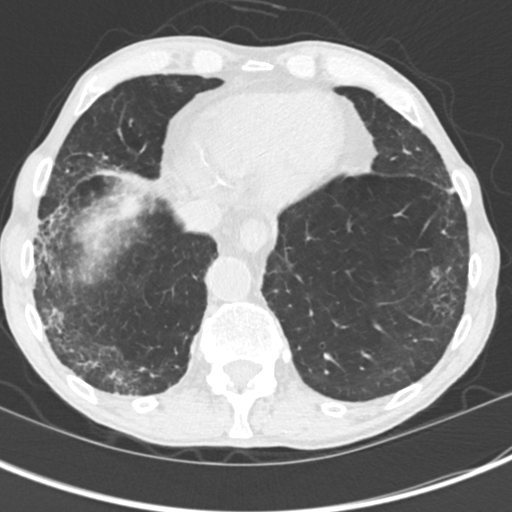
[im 49/163  lung]
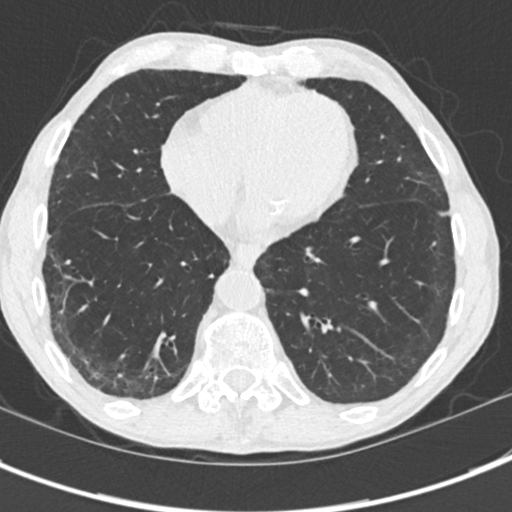
[im 61/163  mediastinal]
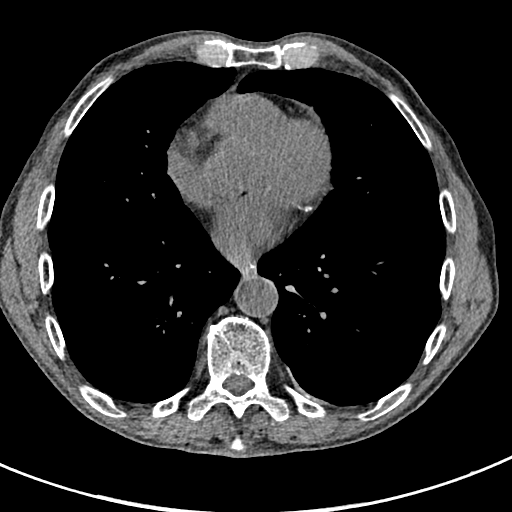
[im 61/163  lung]
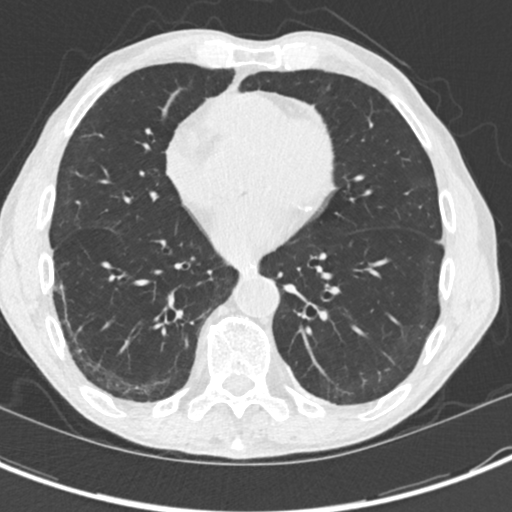
[im 73/163  lung]
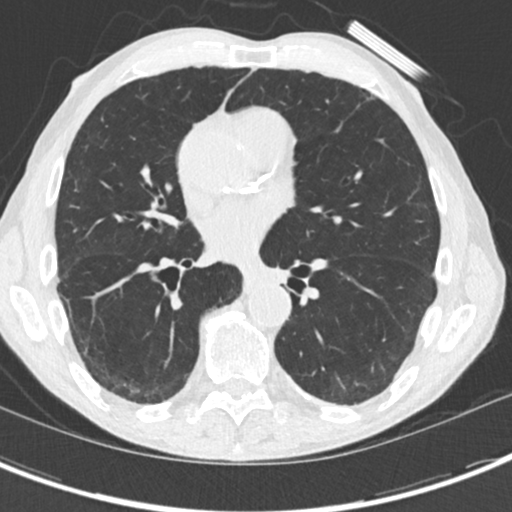
[im 91/163  lung]
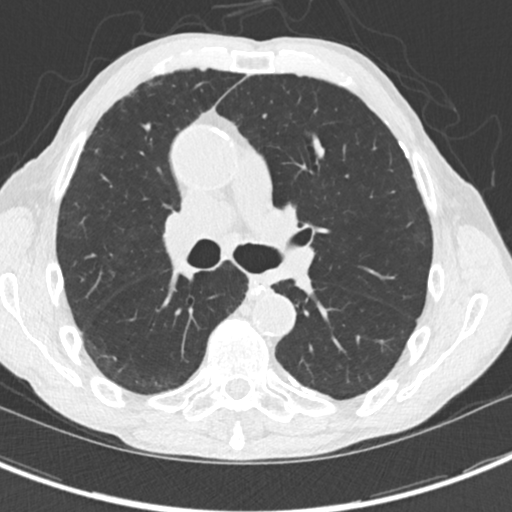
[im 103/163  lung]
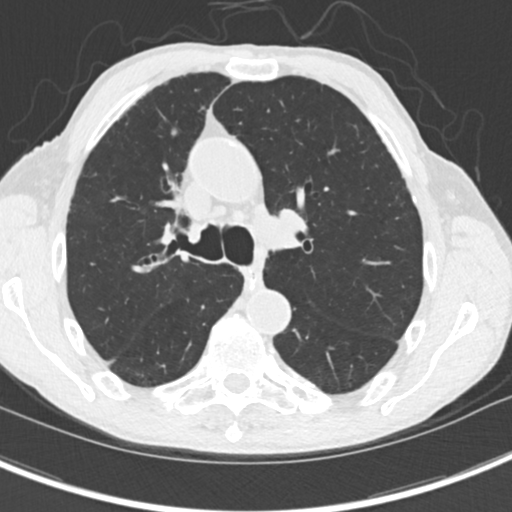
[im 115/163  mediastinal]
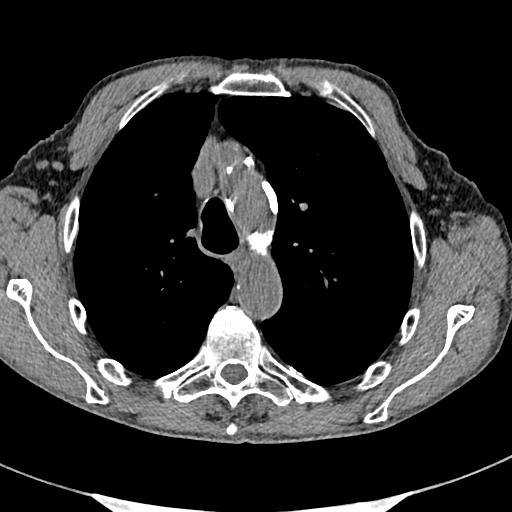
[im 115/163  lung]
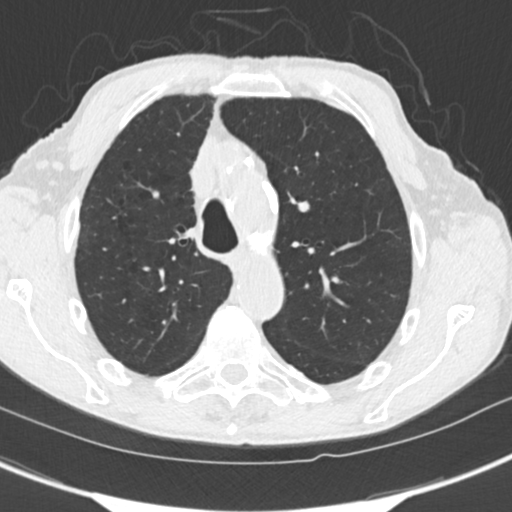
[im 127/163  lung]
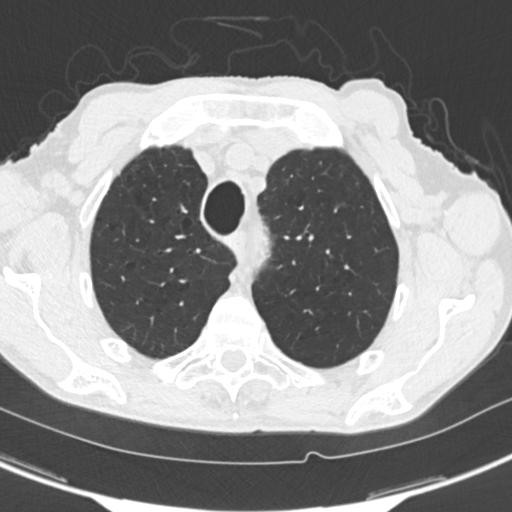
[im 139/163  lung]
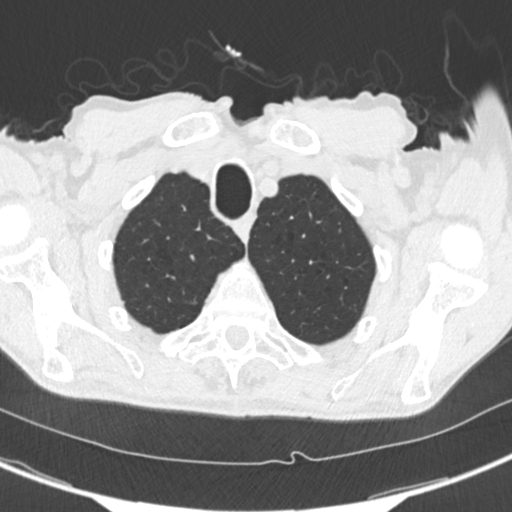
[im 151/163  lung]
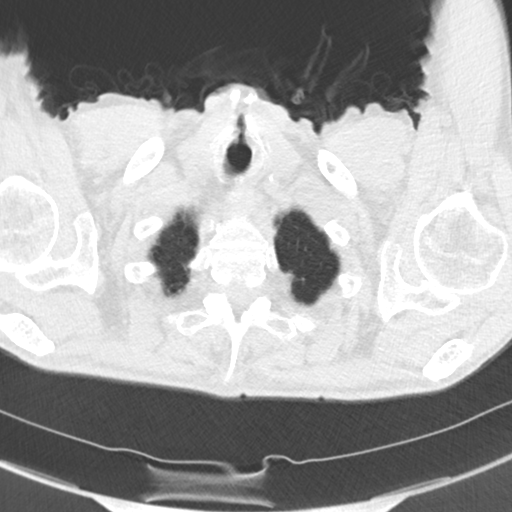

[Series 5: coronal · coronal · 0.63mm/px · 3 of 131 slices shown]
[im 27/131  lung]
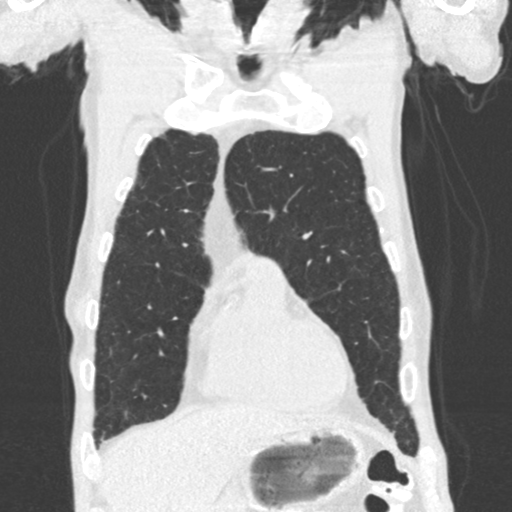
[im 53/131  lung]
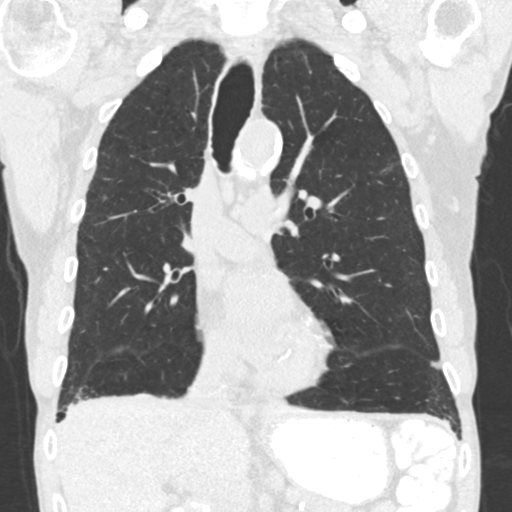
[im 79/131  lung]
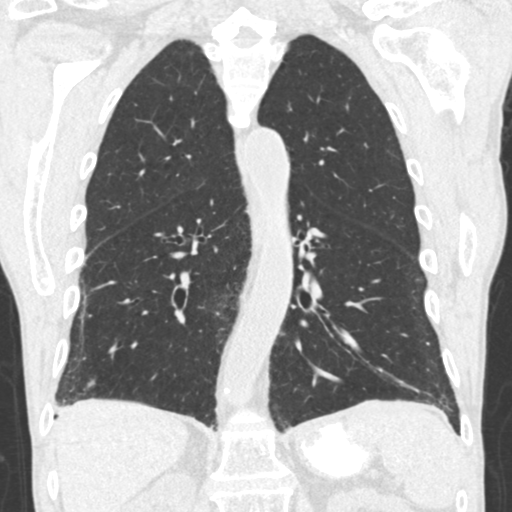

[15 of 36 positions shown; findings below may reference images not displayed]

FINDINGS: CT CHEST FINDINGS

Cardiovascular: The heart is normal in size. No pericardial
effusion. Stable advanced atherosclerotic calcifications involving
the aorta, branch vessels and coronary arteries. No aortic aneurysm.

Mediastinum/Nodes: No mediastinal or hilar mass or lymphadenopathy.
Small scattered lymph nodes are stable. The esophagus is grossly
normal.

Lungs/Pleura: Stable emphysematous changes and pulmonary scarring.

Stable calcified pleural plaques bilaterally suggesting asbestos
related pleural disease. No acute pulmonary findings or worrisome
pulmonary lesions. A few tiny scattered subpleural pulmonary nodules
are unchanged. There are also a few scattered calcified granulomas.
Basilar interstitial disease is noted bilaterally. No pleural
effusion.

Musculoskeletal: No chest wall mass, supraclavicular or axillary
adenopathy. Small scattered lymph nodes appears stable.

CT ABDOMEN PELVIS FINDINGS

Hepatobiliary: No focal hepatic lesions are identified without
contrast. No intrahepatic biliary dilatation.

Pancreas: Grossly normal without contrast. No lesions or
inflammation.

Spleen: Normal size.  No focal lesions.

Adrenals/Urinary Tract: The adrenal glands are grossly normal.

Mild bilateral hydroureteronephrosis but no obstructing ureteral
calculi. The double-J ureteral stent seen on the prior study have
been removed. There are bilateral simple appearing renal cysts and a
stable hyperdense cyst involving the midpole region of the left
kidney. No worrisome renal lesions are identified without contrast.

No definite bladder lesions.

Stomach/Bowel: The stomach, duodenum, small bowel and colon are
unremarkable. No acute inflammatory changes, mass lesions or
obstructive findings. The terminal ileum and appendix are normal.

Vascular/Lymphatic: Stable advanced atherosclerotic calcifications
involving the aorta and branch vessels but no aneurysm. No
mesenteric or retroperitoneal mass or lymphadenopathy. No pelvic
lymphadenopathy. No inguinal lymphadenopathy.

Reproductive: Mild prostate gland enlargement with median lobe
hypertrophy impressing on the base of the bladder.

Other: No pelvic mass or free pelvic fluid collections.

Musculoskeletal: No significant bony findings. No pleuritic or
sclerotic bone lesions.
IMPRESSION: 1. Stable CT appearance of the chest, abdomen and pelvis without
contrast. No findings suspicious for recurrent bladder cancer or
metastatic disease.
2. Mild bilateral hydroureteronephrosis but no obstructing calculi
or lesions.
3. Stable emphysematous changes, pulmonary scarring and calcified
pleural plaques.
4. Stable advanced atherosclerotic calcifications involving the
thoracic and abdominal aorta and branch vessels including extensive
three-vessel coronary artery calcifications.

## 2019-04-03 NOTE — Progress Notes (Signed)
Patient is coming in for follow up, he is doing well no complaints.  

## 2019-04-04 ENCOUNTER — Inpatient Hospital Stay: Payer: Medicare HMO

## 2019-04-04 ENCOUNTER — Encounter: Payer: Self-pay | Admitting: Oncology

## 2019-04-04 ENCOUNTER — Inpatient Hospital Stay (HOSPITAL_BASED_OUTPATIENT_CLINIC_OR_DEPARTMENT_OTHER): Payer: Medicare HMO | Admitting: Oncology

## 2019-04-04 ENCOUNTER — Other Ambulatory Visit: Payer: Self-pay

## 2019-04-04 VITALS — BP 133/49 | HR 50 | Temp 96.5°F | Ht 68.0 in | Wt 125.0 lb

## 2019-04-04 DIAGNOSIS — Z7982 Long term (current) use of aspirin: Secondary | ICD-10-CM | POA: Diagnosis not present

## 2019-04-04 DIAGNOSIS — R7989 Other specified abnormal findings of blood chemistry: Secondary | ICD-10-CM

## 2019-04-04 DIAGNOSIS — Z9221 Personal history of antineoplastic chemotherapy: Secondary | ICD-10-CM | POA: Diagnosis not present

## 2019-04-04 DIAGNOSIS — N179 Acute kidney failure, unspecified: Secondary | ICD-10-CM

## 2019-04-04 DIAGNOSIS — Z87891 Personal history of nicotine dependence: Secondary | ICD-10-CM | POA: Diagnosis not present

## 2019-04-04 DIAGNOSIS — Z7189 Other specified counseling: Secondary | ICD-10-CM

## 2019-04-04 DIAGNOSIS — C678 Malignant neoplasm of overlapping sites of bladder: Secondary | ICD-10-CM | POA: Diagnosis not present

## 2019-04-04 DIAGNOSIS — C772 Secondary and unspecified malignant neoplasm of intra-abdominal lymph nodes: Secondary | ICD-10-CM | POA: Diagnosis not present

## 2019-04-04 DIAGNOSIS — Z5112 Encounter for antineoplastic immunotherapy: Secondary | ICD-10-CM | POA: Diagnosis not present

## 2019-04-04 DIAGNOSIS — E039 Hypothyroidism, unspecified: Secondary | ICD-10-CM | POA: Diagnosis not present

## 2019-04-04 DIAGNOSIS — I10 Essential (primary) hypertension: Secondary | ICD-10-CM | POA: Diagnosis not present

## 2019-04-04 DIAGNOSIS — N184 Chronic kidney disease, stage 4 (severe): Secondary | ICD-10-CM | POA: Diagnosis not present

## 2019-04-04 DIAGNOSIS — D631 Anemia in chronic kidney disease: Secondary | ICD-10-CM | POA: Diagnosis not present

## 2019-04-04 LAB — COMPREHENSIVE METABOLIC PANEL
ALT: 15 U/L (ref 0–44)
AST: 23 U/L (ref 15–41)
Albumin: 4.1 g/dL (ref 3.5–5.0)
Alkaline Phosphatase: 80 U/L (ref 38–126)
Anion gap: 8 (ref 5–15)
BUN: 38 mg/dL — ABNORMAL HIGH (ref 8–23)
CO2: 22 mmol/L (ref 22–32)
Calcium: 9.1 mg/dL (ref 8.9–10.3)
Chloride: 109 mmol/L (ref 98–111)
Creatinine, Ser: 2.64 mg/dL — ABNORMAL HIGH (ref 0.61–1.24)
GFR calc Af Amer: 25 mL/min — ABNORMAL LOW (ref >60)
GFR calc non Af Amer: 21 mL/min — ABNORMAL LOW (ref >60)
Glucose, Bld: 112 mg/dL — ABNORMAL HIGH (ref 70–99)
Potassium: 3.9 mmol/L (ref 3.5–5.1)
Sodium: 139 mmol/L (ref 135–145)
Total Bilirubin: 0.7 mg/dL (ref 0.3–1.2)
Total Protein: 7.1 g/dL (ref 6.5–8.1)

## 2019-04-04 LAB — CBC WITH DIFFERENTIAL/PLATELET
Abs Immature Granulocytes: 0.02 10*3/uL (ref 0.00–0.07)
Basophils Absolute: 0.1 10*3/uL (ref 0.0–0.1)
Basophils Relative: 1 %
Eosinophils Absolute: 0.6 10*3/uL — ABNORMAL HIGH (ref 0.0–0.5)
Eosinophils Relative: 11 %
HCT: 27.4 % — ABNORMAL LOW (ref 39.0–52.0)
Hemoglobin: 9.2 g/dL — ABNORMAL LOW (ref 13.0–17.0)
Immature Granulocytes: 0 %
Lymphocytes Relative: 26 %
Lymphs Abs: 1.5 10*3/uL (ref 0.7–4.0)
MCH: 33.7 pg (ref 26.0–34.0)
MCHC: 33.6 g/dL (ref 30.0–36.0)
MCV: 100.4 fL — ABNORMAL HIGH (ref 80.0–100.0)
Monocytes Absolute: 0.6 10*3/uL (ref 0.1–1.0)
Monocytes Relative: 10 %
Neutro Abs: 3.1 10*3/uL (ref 1.7–7.7)
Neutrophils Relative %: 52 %
Platelets: 224 10*3/uL (ref 150–400)
RBC: 2.73 MIL/uL — ABNORMAL LOW (ref 4.22–5.81)
RDW: 12.4 % (ref 11.5–15.5)
WBC: 5.8 10*3/uL (ref 4.0–10.5)
nRBC: 0 % (ref 0.0–0.2)

## 2019-04-04 LAB — TSH: TSH: 1.014 u[IU]/mL (ref 0.350–4.500)

## 2019-04-04 MED ORDER — SODIUM CHLORIDE 0.9 % IV SOLN
Freq: Once | INTRAVENOUS | Status: AC
  Administered 2019-04-04: 11:00:00 via INTRAVENOUS
  Filled 2019-04-04: qty 250

## 2019-04-04 MED ORDER — SODIUM CHLORIDE 0.9 % IV SOLN
Freq: Once | INTRAVENOUS | Status: AC
  Administered 2019-04-04: 10:00:00 via INTRAVENOUS
  Filled 2019-04-04: qty 250

## 2019-04-04 MED ORDER — SODIUM CHLORIDE 0.9 % IV SOLN
1200.0000 mg | Freq: Once | INTRAVENOUS | Status: AC
  Administered 2019-04-04: 1200 mg via INTRAVENOUS
  Filled 2019-04-04: qty 20

## 2019-04-04 NOTE — Progress Notes (Signed)
Last TSH 12/20/18, MD will order lab today

## 2019-04-06 NOTE — Progress Notes (Signed)
Hematology/Oncology Consult note [facility]  [phone] [phone]  Patient Care Team: [name], MD as PCP - General (Internal Medicine)   Name of the patient: Edward Cummings  [id number]  1932-12-12   Date of visit: 04/06/19  Diagnosis-stage IV bladder cancer  Chief complaint/ Reason for visit-on treatment assessment prior to next cycle of Tecentriq  Heme/Onc history:  Oncology History Overview Note  # AUG 2019-TRANSITIONAL CELL BLADDER CA [~ 4cm tumor] s/p cystoscopy [Dr.Stoiff]  with extensive angiolymphatic invasion; [name]; [name]; mediastinal uptake; right pubic rami uptake.  # 19th ep 2019- Tecentriq;    # Match 2020- HYPOTHYROIDISM [sec to [place]]  # CKD stage III-IV [creat 2.5]; [name]; NO other targets**  DIAGNOSIS: Bladder ca  STAGE:   IV  ;GOALS: palliative  CURRENT/MOST RECENT THERAPY:Tecentriq      Cancer of overlapping sites of bladder ([place])  02/28/2018 -  Chemotherapy   The patient had atezolizumab (TECENTRIQ) 1,[name], 1,200 mg, Intravenous, Once, 18 of 19 cycles Administration: 1,200 mg (02/28/2018), 1,200 mg (03/21/2018), 1,200 mg (04/11/2018), 1,200 mg (05/02/2018), 1,200 mg (06/13/2018), 1,200 mg (05/23/2018), 1,200 mg (07/04/2018), 1,200 mg (07/25/2018), 1,200 mg (08/15/2018), 1,200 mg (09/05/2018), 1,200 mg (10/25/2018), 1,200 mg (11/22/2018), 1,200 mg (12/20/2018), 1,200 mg (01/10/2019), 1,200 mg (01/31/2019), 1,200 mg (02/21/2019), 1,200 mg (03/14/2019), 1,200 mg (04/04/2019)  for chemotherapy treatment.       Interval history-patient is doing well then chronic mild fatigue patient denies any other complaints at this time  ECOG PS- 1 Pain scale-  0   Review of systems- Review of Systems  Constitutional: Positive for malaise/fatigue. Negative for chills, fever and weight loss.  HENT: Negative for congestion, ear discharge and nosebleeds.   Eyes: Negative for blurred vision.  Respiratory: Negative for cough, hemoptysis, sputum production, shortness of breath and wheezing.   Cardiovascular: Negative for chest pain, palpitations, orthopnea and claudication.  Gastrointestinal: Negative for abdominal pain, blood in stool, constipation, diarrhea, heartburn, melena, nausea and vomiting.  Genitourinary: Negative for dysuria, flank pain, frequency, hematuria and urgency.  Musculoskeletal: Negative for back pain, joint pain and myalgias.  Skin: Negative for rash.  Neurological: Negative for dizziness, tingling, focal weakness, seizures, weakness and headaches.  Endo/Heme/Allergies: Does not bruise/bleed easily.  Psychiatric/Behavioral: Negative for depression and suicidal ideas. The patient does not have insomnia.       No Known Allergies   Past Medical History:  Diagnosis Date   Anemia    Cancer ([place])    bladder   Chronic kidney disease    Depression    Hypertension    Neuromuscular disorder ([place])    Nerve damage to left face/eye since around 2002.     Past Surgical History:  Procedure Laterality Date   CYSTOSCOPY W/ RETROGRADES Bilateral 01/25/2018   Procedure: CYSTOSCOPY WITH RETROGRADE PYELOGRAM;  [name], MD;  [name];  Service: Urology;  Laterality: Bilateral;   CYSTOSCOPY W/ URETERAL STENT PLACEMENT Bilateral 01/06/2019   Procedure: CYSTOSCOPY WITH RETROGRADE PYELOGRAM/URETERAL STENT REMOVAL;  [name], MD;  [name];  Service: Urology;  Laterality: Bilateral;   CYSTOSCOPY WITH STENT PLACEMENT Bilateral 01/25/2018   Procedure: CYSTOSCOPY WITH STENT PLACEMENT;  [name], MD;  [name];  Service: Urology;  Laterality: Bilateral;    DORSAL SLIT N/A 01/06/2019   Procedure: DORSAL SLIT;  [name], MD;  [name];  Service: Urology;  [name];   EYE SURGERY     Cornea transplants bilaterally & cataract surgery.   TRANSURETHRAL RESECTION OF BLADDER TUMOR N/A 01/25/2018   Procedure: TRANSURETHRAL RESECTION OF BLADDER TUMOR (TURBT);  [name], MD;  [name];  Service: Urology;  [name];    Social History   Socioeconomic History   Marital status: Married    Spouse name: [name]   Number of children: Not on file   Years of education: Not on file   Highest education level: Not on file  Occupational History   Not on file  Social Needs   Financial resource strain: Not hard at all   Food insecurity    Worry: Never true    Inability: Never true   Transportation needs    Medical: Not on file    Non-medical: Not on file  Tobacco Use   Smoking status: Former Smoker   Smokeless tobacco: Current User    Types: Chew   Tobacco comment: Stopped approximately 10 years ago.  Substance and Sexual Activity   Alcohol use: Yes    Alcohol/week: 2.0 standard drinks    Types: 2 Cans of beer per week    Comment: Daily   Drug use: Never   Sexual activity: Yes    Birth control/protection: None  Lifestyle   Physical activity    Days per week: Not on file    Minutes per session: Not on file   Stress: Only a little  Relationships   Press photographer on phone: Not on file    Gets together: Not on file    Attends religious service: Not on file    Active member of club or organization: Not on file    Attends meetings of clubs or organizations: Not on file    Relationship status: Not on file   Intimate partner violence    Fear of current or ex partner: Not on file    Emotionally abused: Not on file    Physically abused: Not on file    Forced sexual activity: Not on file  Other Topics Concern   Not on file  Social History Narrative    Not on file    Family History  Problem Relation Age of Onset   Prostate cancer Neg Hx    Kidney cancer Neg Hx    Bladder Cancer Neg Hx      Current Outpatient Medications:    amLODipine (NORVASC) 5 MG tablet, Take 1 tablet (5 mg total) by mouth daily., Disp: 30 tablet, Rfl: 3   aspirin EC 81 MG tablet, Take 81 mg by mouth daily. , Disp: , Rfl:    docusate sodium (COLACE) 50 MG capsule, Take 50 mg by mouth at bedtime., Disp: , Rfl:    fexofenadine (ALLEGRA) 180 MG tablet, Take 180 mg by mouth daily., Disp: , Rfl:    fluticasone (FLONASE) 50 MCG/ACT nasal spray, Place 2 sprays into both nostrils daily as needed for allergies or rhinitis., Disp: , Rfl:    HYDROcodone-acetaminophen (NORCO/VICODIN) 5-325 MG tablet, Take 1 tablet by mouth every 8 (eight) hours as needed for moderate pain., Disp: 30 tablet, Rfl: 0   levothyroxine (SYNTHROID) 88 MCG tablet, Take 1 tablet (88 mcg total) by mouth daily before breakfast. Empty stomach- 1 hour prior  to breakfast., Disp: 30 tablet, Rfl: 3   Multiple Vitamin (MULTIVITAMIN WITH MINERALS) TABS tablet, Take 1 tablet by mouth daily. , Disp: , Rfl:    oxybutynin (DITROPAN) 5 MG tablet, Take 1 tablet by mouth 3 (three) times daily as needed., Disp: , Rfl:    prednisoLONE acetate (PRED FORTE) 1 % ophthalmic suspension, Place 1 drop into both eyes daily., Disp: , Rfl:    tamsulosin (FLOMAX) 0.4 MG CAPS capsule, Take 1 capsule (0.4 mg total) by mouth at bedtime., Disp: 90 capsule, Rfl: 1  Physical exam:  Vitals:   04/04/19 0858  BP: (!) 133/49  Pulse: (!) 50  Temp: (!) 96.5 F (35.8 C)  TempSrc: Tympanic  Weight: 125 lb (56.7 kg)  Height: _0  (1.727 m)   Physical Exam Constitutional:      General: He is not in acute distress. HENT:     Head: Normocephalic and atraumatic.  Eyes:     Pupils: Pupils are equal, round, and reactive to light.  Neck:     Musculoskeletal: Normal range of motion.  Cardiovascular:     Rate and Rhythm:  Normal rate and regular rhythm.     Heart sounds: Normal heart sounds.  Pulmonary:     Effort: Pulmonary effort is normal.     Breath sounds: Normal breath sounds.  Abdominal:     General: Bowel sounds are normal.     Palpations: Abdomen is soft.  Skin:    General: Skin is warm and dry.  Neurological:     Mental Status: He is alert and oriented to person, place, and time.      CMP Latest Ref Rng & Units 04/04/2019  Glucose 70 - 99 mg/dL 112(H)  BUN 8 - 23 mg/dL 38(H)  Creatinine 0.61 - 1.24 mg/dL 2.64(H)  Sodium 135 - 145 mmol/L 139  Potassium 3.5 - 5.1 mmol/L 3.9  Chloride 98 - 111 mmol/L 109  CO2 22 - 32 mmol/L 22  Calcium 8.9 - 10.3 mg/dL 9.1  Total Protein 6.5 - 8.1 g/dL 7.1  Total Bilirubin 0.3 - 1.2 mg/dL 0.7  Alkaline Phos 38 - 126 U/L 80  AST 15 - 41 U/L 23  ALT 0 - 44 U/L 15   CBC Latest Ref Rng & Units 04/04/2019  WBC 4.0 - 10.5 K/uL 5.8  Hemoglobin 13.0 - 17.0 g/dL 9.2(L)  Hematocrit 39.0 - 52.0 % 27.4(L)  Platelets 150 - 400 K/uL 224    No images are attached to the encounter.  Ct Abdomen Pelvis Wo Contrast  Result Date: 03/31/2019 CLINICAL DATA:  Restaging metastatic bladder cancer. EXAM: CT CHEST, ABDOMEN AND PELVIS WITHOUT CONTRAST TECHNIQUE: Multidetector CT imaging of the chest, abdomen and pelvis was performed following the standard protocol without IV contrast. COMPARISON:  CT scan 12/16/2018. FINDINGS: CT CHEST FINDINGS Cardiovascular: The heart is normal in size. No pericardial effusion. Stable advanced atherosclerotic calcifications involving the aorta, branch vessels and coronary arteries. No aortic aneurysm. Mediastinum/Nodes: No mediastinal or hilar mass or lymphadenopathy. Small scattered lymph nodes are stable. The esophagus is grossly normal. Lungs/Pleura: Stable emphysematous changes and pulmonary scarring. 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 No acute pulmonary findings or worrisome pulmonary  lesions. A few tiny scattered subpleural pulmonary nodules are unchanged. There are also a few scattered calcified granulomas. Basilar interstitial disease is noted bilaterally. No pleural effusion. Musculoskeletal: No chest wall mass, supraclavicular or axillary adenopathy. Small scattered lymph nodes appears stable. CT ABDOMEN PELVIS FINDINGS Hepatobiliary: No focal  hepatic lesions are identified without contrast. No intrahepatic biliary dilatation. Pancreas: Grossly normal without contrast. No lesions or inflammation. Spleen: Normal size.  No focal lesions. Adrenals/Urinary Tract: The adrenal glands are grossly normal. Mild bilateral hydroureteronephrosis but no obstructing ureteral calculi. The double-J ureteral stent seen on the prior study have been removed. There are bilateral simple appearing renal cysts and a stable hyperdense cyst involving the midpole region of the left kidney. No worrisome renal lesions are identified without contrast. No definite bladder lesions. Stomach/Bowel: The stomach, duodenum, small bowel and colon are unremarkable. No acute inflammatory changes, mass lesions or obstructive findings. The terminal ileum and appendix are normal. Vascular/Lymphatic: Stable advanced atherosclerotic calcifications involving the aorta and branch vessels but no aneurysm. No mesenteric or retroperitoneal mass or lymphadenopathy. No pelvic lymphadenopathy. No inguinal lymphadenopathy. Reproductive: Mild prostate gland enlargement with median lobe hypertrophy impressing on the base of the bladder. Other: No pelvic mass or free pelvic fluid collections. Musculoskeletal: No significant bony findings. No pleuritic or sclerotic bone lesions. IMPRESSION: 1. Stable CT appearance of the chest, abdomen and pelvis without contrast. No findings suspicious for recurrent bladder cancer or metastatic disease. 2. Mild bilateral hydroureteronephrosis but no obstructing calculi or lesions. 3. Stable emphysematous changes,  pulmonary scarring and calcified pleural plaques. 4. Stable advanced atherosclerotic calcifications involving the thoracic and abdominal aorta and branch vessels including extensive three-vessel coronary artery calcifications. Electronically Signed   By: Marijo Sanes M.D.   On: 03/31/2019 09:47   Ct Chest Wo Contrast  Result Date: 03/31/2019 CLINICAL DATA:  Restaging metastatic bladder cancer. EXAM: CT CHEST, ABDOMEN AND PELVIS WITHOUT CONTRAST TECHNIQUE: Multidetector CT imaging of the chest, abdomen and pelvis was performed following the standard protocol without IV contrast. COMPARISON:  CT scan 12/16/2018. FINDINGS: CT CHEST FINDINGS Cardiovascular: The heart is normal in size. No pericardial effusion. Stable advanced atherosclerotic calcifications involving the aorta, branch vessels and coronary arteries. No aortic aneurysm. Mediastinum/Nodes: No mediastinal or hilar mass or lymphadenopathy. Small scattered lymph nodes are stable. The esophagus is grossly normal. Lungs/Pleura: Stable emphysematous changes and pulmonary scarring. 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 No acute pulmonary findings or worrisome pulmonary lesions. A few tiny scattered subpleural pulmonary nodules are unchanged. There are also a few scattered calcified granulomas. Basilar interstitial disease is noted bilaterally. No pleural effusion. Musculoskeletal: No chest wall mass, supraclavicular or axillary adenopathy. Small scattered lymph nodes appears stable. CT ABDOMEN PELVIS FINDINGS Hepatobiliary: No focal hepatic lesions are identified without contrast. No intrahepatic biliary dilatation. Pancreas: Grossly normal without contrast. No lesions or inflammation. Spleen: Normal size.  No focal lesions. Adrenals/Urinary Tract: The adrenal glands are grossly normal. Mild bilateral hydroureteronephrosis but no obstructing ureteral calculi. The double-J ureteral stent seen on the prior  study have been removed. There are bilateral simple appearing renal cysts and a stable hyperdense cyst involving the midpole region of the left kidney. No worrisome renal lesions are identified without contrast. No definite bladder lesions. Stomach/Bowel: The stomach, duodenum, small bowel and colon are unremarkable. No acute inflammatory changes, mass lesions or obstructive findings. The terminal ileum and appendix are normal. Vascular/Lymphatic: Stable advanced atherosclerotic calcifications involving the aorta and branch vessels but no aneurysm. No mesenteric or retroperitoneal mass or lymphadenopathy. No pelvic lymphadenopathy. No inguinal lymphadenopathy. Reproductive: Mild prostate gland enlargement with median lobe hypertrophy impressing on the base of the bladder. Other: No pelvic mass or free pelvic fluid collections. Musculoskeletal: No significant bony findings. No pleuritic or sclerotic bone lesions. IMPRESSION:  1. Stable CT appearance of the chest, abdomen and pelvis without contrast. No findings suspicious for recurrent bladder cancer or metastatic disease. 2. Mild bilateral hydroureteronephrosis but no obstructing calculi or lesions. 3. Stable emphysematous changes, pulmonary scarring and calcified pleural plaques. 4. Stable advanced atherosclerotic calcifications involving the thoracic and abdominal aorta and branch vessels including extensive three-vessel coronary artery calcifications. Electronically Signed   By: Marijo Sanes M.D.   On: 03/31/2019 09:47     Assessment and plan- Patient is a 85 y.o. male with stage IV bladder cancer here for on treatment assessment prior to next cycle of Tecentriq  Counts okay to proceed with next cycle of maintenance Tecentriq.  TSH from today is normal.  Overall patient is tolerating his treatment well without any significant side effects.  I have also reviewed CT chest abdomen pelvis images independently and discussed findings with the patient.  Overall  CT findings are stable and there is no concern for any recurrent or metastatic disease.  Patient was noted to have bilateral mild hydroureteral nephrosis.  His creatinine is mildly elevated today at 2.6 as compared to his baseline of 2.  I will get in touch with Dr. Bernardo Heater if any urological intervention is needed from his standpoint.  We will give him 1 L of IV fluids today given his AKI.  Return to clinic in 3 weeks.  Port labs: CBC with differential, CMP.  See Dr. Rogue Bussing.  Gets Tecentriq   Visit Diagnosis 1. Encounter for antineoplastic immunotherapy   2. Cancer of overlapping sites of bladder ([place])   3. AKI (acute kidney injury) ([place])      Dr. Randa Evens, MD, MPH [facility] at [facility] [phone] 04/06/2019 12:20 PM

## 2019-04-09 ENCOUNTER — Telehealth: Payer: Self-pay | Admitting: *Deleted

## 2019-04-09 ENCOUNTER — Telehealth: Payer: Self-pay

## 2019-04-09 ENCOUNTER — Other Ambulatory Visit: Payer: Self-pay | Admitting: Urology

## 2019-04-09 DIAGNOSIS — N1330 Unspecified hydronephrosis: Secondary | ICD-10-CM

## 2019-04-09 NOTE — Telephone Encounter (Signed)
The son states that the pt. Thought it was ok but something was wrong that may need f/u and his mom is going through cancer and she just did not remember anything md said. I told him that  Ct did not show any residual cancer and no areas of concern from cancer. He did have some hydro nephrosis and dr Janese Banks was getting in touch with [facility] to see if something needs to be done. Will let son know if he needs anything from the urology issue.

## 2019-04-09 NOTE — Telephone Encounter (Signed)
Called pt's wife informed her of the information below. Wife gave verbal understanding.      Mr. Roughton had a recent CT and oncology which showed mild bilateral hydronephrosis.  He will have his creatinine repeated in oncology in approximately 2 weeks.  I put in an order for a renal ultrasound to be done around that same time.  Will call with results.

## 2019-04-09 NOTE — Progress Notes (Signed)
Mr. Schoenfelder had a recent CT and oncology which showed mild bilateral hydronephrosis.  He will have his creatinine repeated in oncology in approximately 2 weeks.  I put in an order for a renal ultrasound to be done around that same time.  Will call with results.

## 2019-04-09 NOTE — Telephone Encounter (Signed)
Patient son called requesting a call back to explain the results of CT, stating he did not understand what he was told when he spoke with physician about it

## 2019-04-10 ENCOUNTER — Telehealth: Payer: Self-pay | Admitting: *Deleted

## 2019-04-10 NOTE — Telephone Encounter (Signed)
Called Edward Cummings and let him know that pt should get a call on Friday or rmonday to make [place] appt and then will call the pt. And depending on results what plan to make. He will let his father know to be on the look out for  A call.

## 2019-04-10 NOTE — Telephone Encounter (Signed)
I called dr. Bernardo Heater office and got michelle voicemail and left her a message that dr rap spoke to [place] what to do about hydronephrosis. He told her he will order [place] and check labs in few weeks and may have to have stent placed. I do not see anything in appt schedule. Will await the response

## 2019-04-10 NOTE — Telephone Encounter (Signed)
Dr. Bernardo Heater has ordered usg. Check bmp at next visit. If still elevated he may consider stent replacement is what he told me.

## 2019-04-23 ENCOUNTER — Other Ambulatory Visit: Payer: Self-pay

## 2019-04-23 ENCOUNTER — Ambulatory Visit
Admission: RE | Admit: 2019-04-23 | Discharge: 2019-04-23 | Disposition: A | Discharge status: Home / Self Care | Payer: Medicare HMO | Source: Ambulatory Visit | Attending: Urology | Admitting: Urology

## 2019-04-23 DIAGNOSIS — N1330 Unspecified hydronephrosis: Secondary | ICD-10-CM | POA: Diagnosis not present

## 2019-04-23 DIAGNOSIS — N281 Cyst of kidney, acquired: Secondary | ICD-10-CM | POA: Diagnosis not present

## 2019-04-23 IMAGING — US US RENAL
2 series · 13 of 25 positions shown · non-contrast
Comparison: CT abdomen and pelvis [DATE], [DATE]

CLINICAL DATA: Hydronephrosis on recent CT

EXAM:
RENAL / URINARY TRACT ULTRASOUND COMPLETE

[Series 1: us renal · 0.25mm/px · 12 of 55 slices shown (1 of 2)]
[im 1/55]
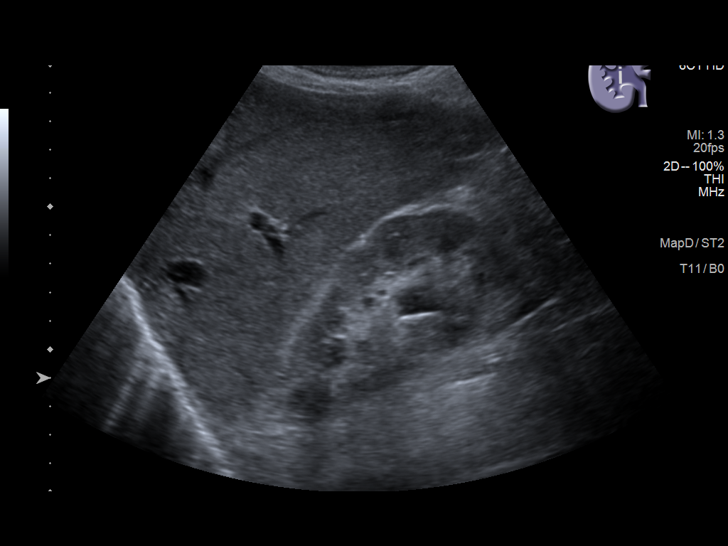
[im 5/55]
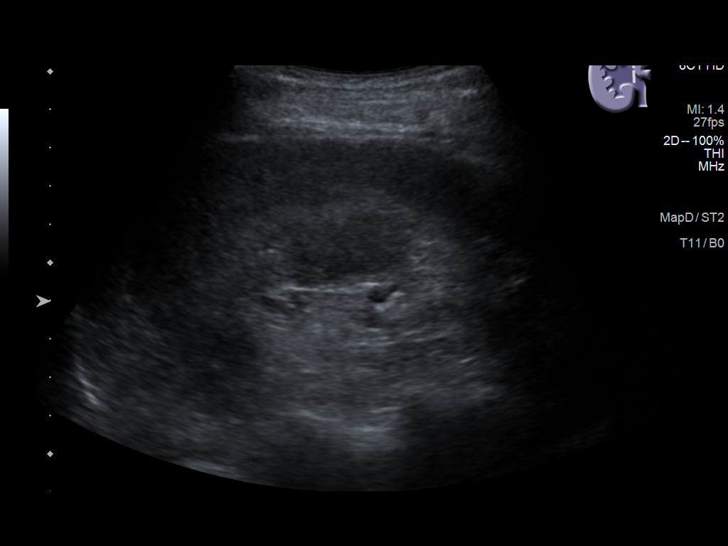
[im 10/55]
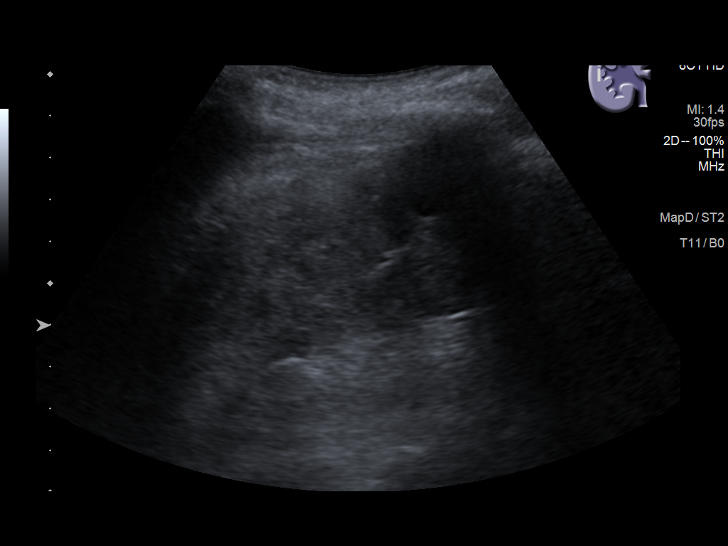
[im 15/55]
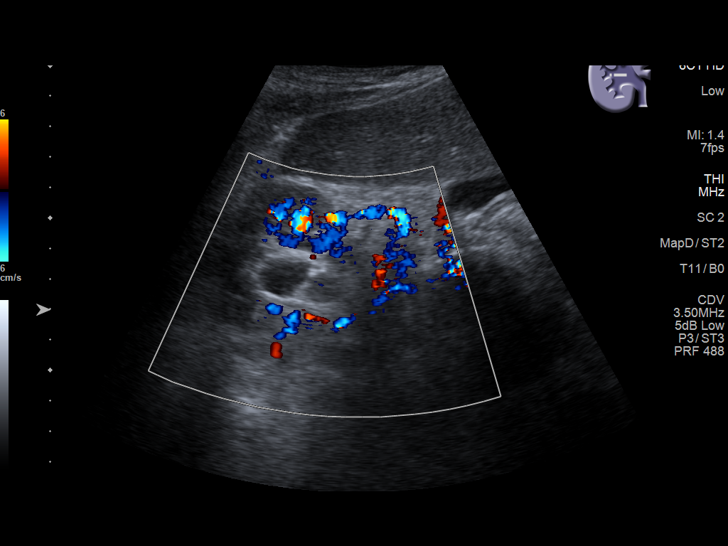
[im 19/55]
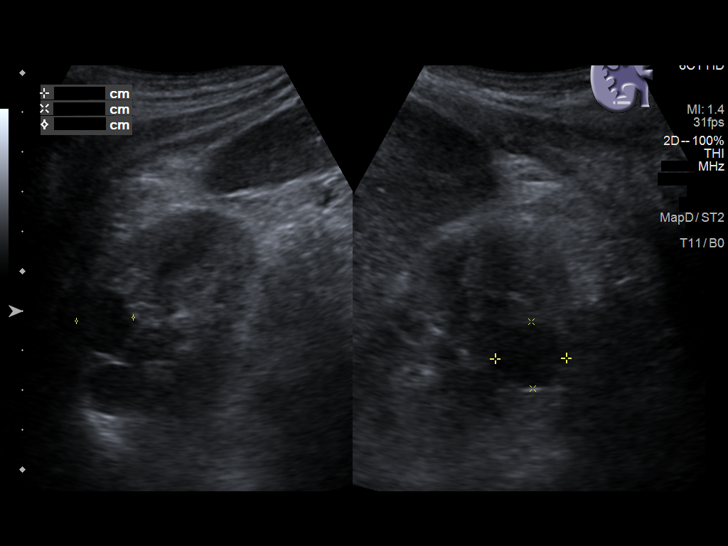
[im 24/55]
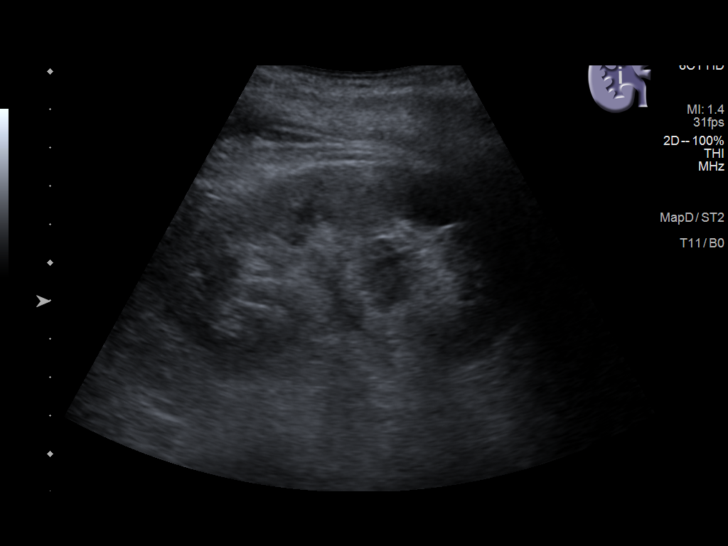
[im 29/55]
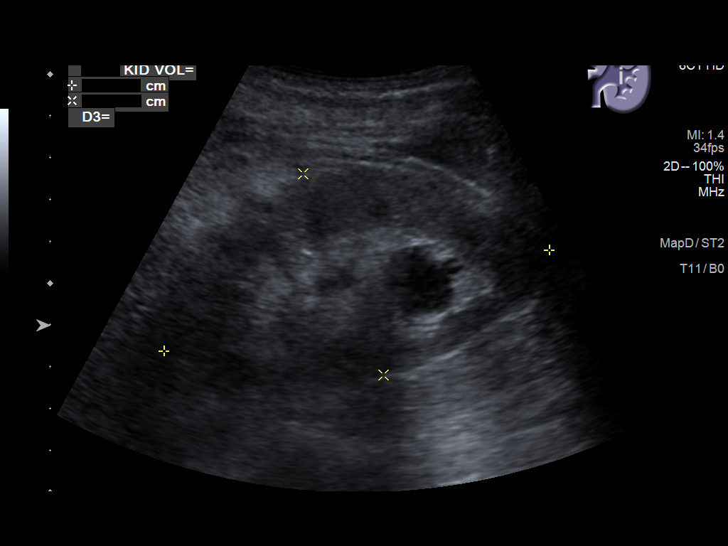
[im 33/55]
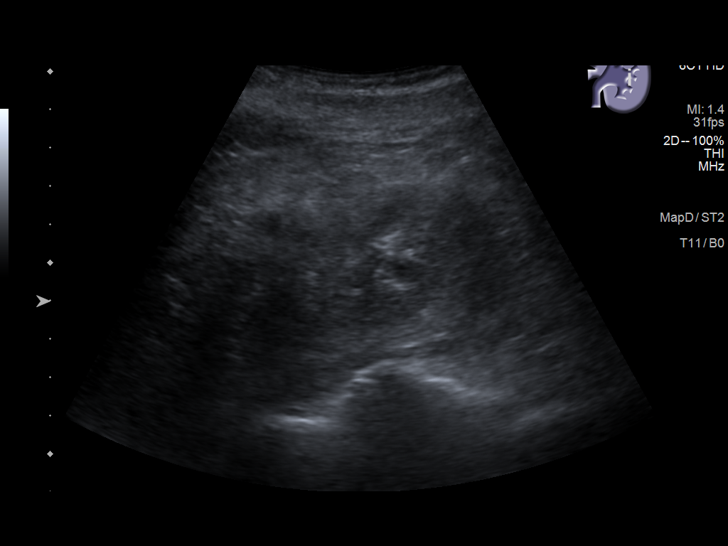
[im 38/55]
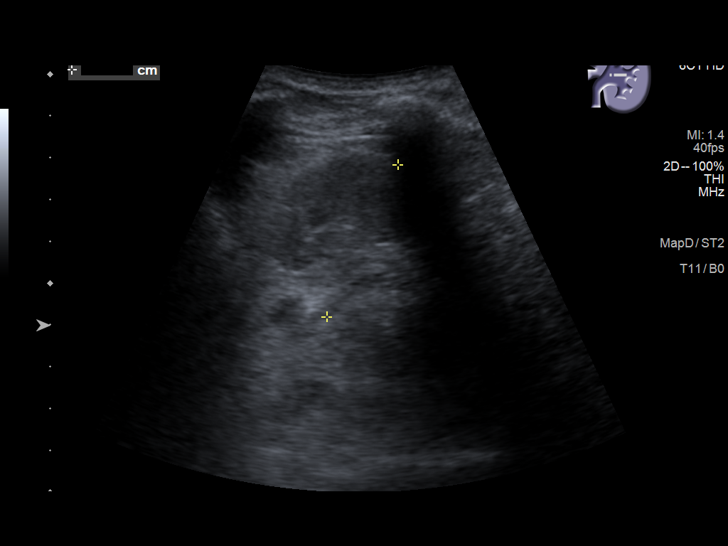
[im 43/55]
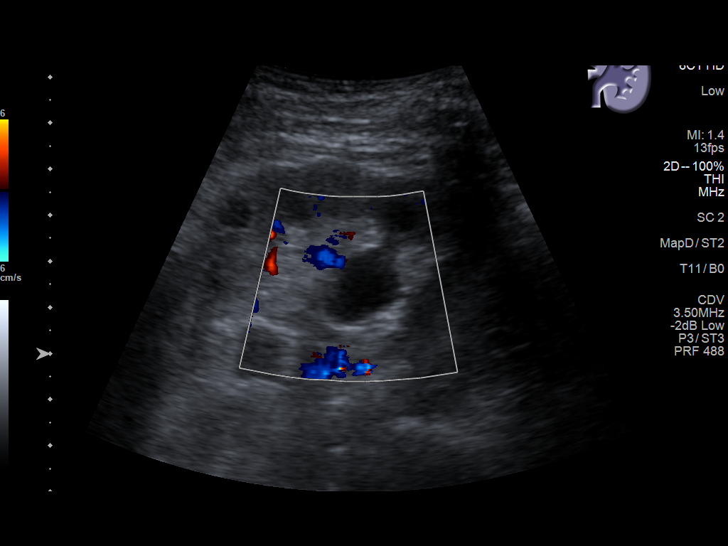
[im 47/55]
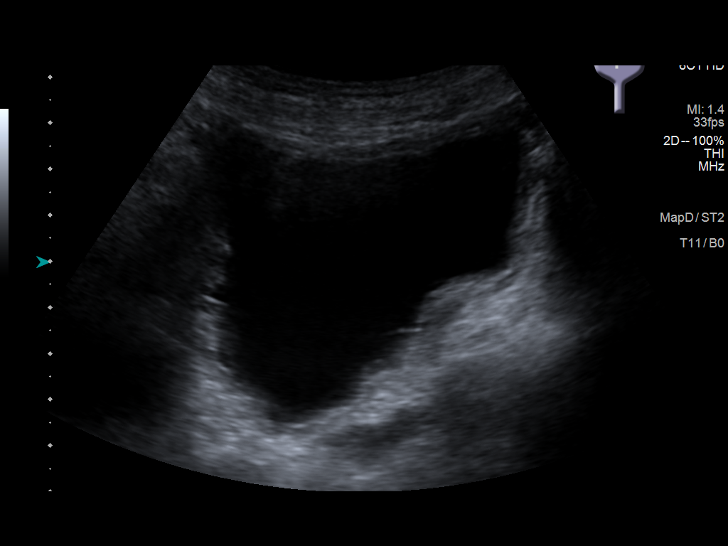
[im 52/55]
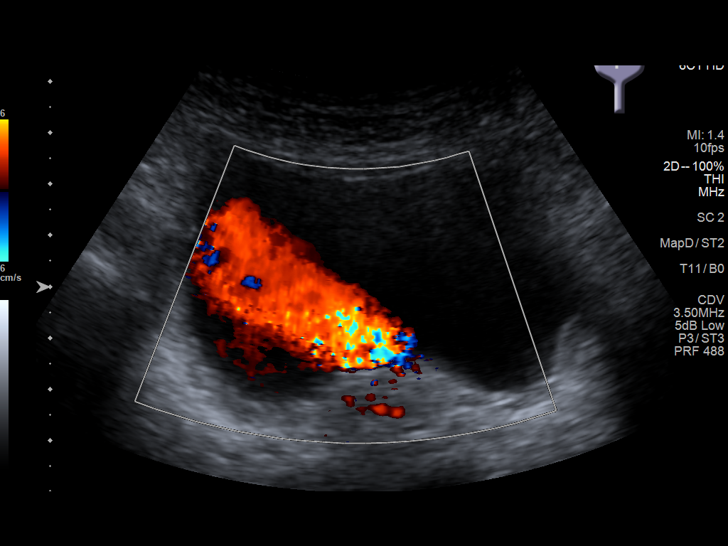

[Series 3: us renal · 0.19mm/px · 1 of 3 slices shown (2 of 2)]
[im 1/3]
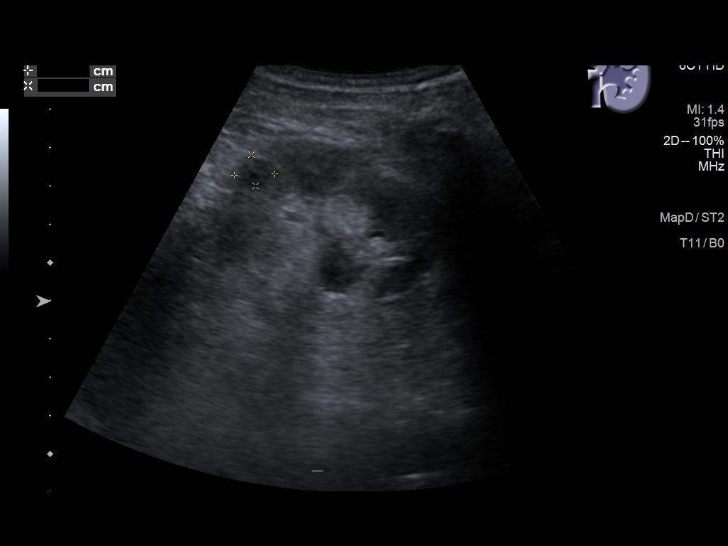

[13 of 25 positions shown; findings below may reference images not displayed]

FINDINGS: Right Kidney:

Renal measurements: 9.8 x 5.0 x 4.8 cm = volume: 122 mL. Cortical
thinning. Borderline increased cortical echogenicity. No shadowing
calcifications. Several small cysts are identified at the lower pole
of the RIGHT kidney, 1.8 x 1.7 x 1.4 cm and 1.7 x 1.1 x 1.4 cm. No
additional masses or hydronephrosis.

Left Kidney:

Renal measurements: 9.5 x 5.2 x 4.0 cm = volume: 103 mL. Cortical
thinning. Increased cortical echogenicity. Peripelvic cyst at
inferior pole 2.0 x 1.7 x 1.4 cm. Additional exophytic nodule at mid
LEFT kidney, 11 x 8 x 10 mm, nonspecific; this grossly corresponds
to a hyperdense nodule on CTs. No hydronephrosis.

Bladder:

Appears normal for degree of bladder distention.

Other:

Enlarged prostate gland 4.6 x 3.9 x 4.5 cm.
IMPRESSION: Prostatic enlargement.

BILATERAL renal cysts.

No definite hydronephrosis.

11 mm complicated cystic lesion mid LEFT kidney, grossly
corresponding in size and position with a hyperdense nodule on CT
question hyperdense cyst versus solid nodule, present since
[DATE].

## 2019-04-24 ENCOUNTER — Telehealth: Payer: Self-pay

## 2019-04-24 NOTE — Telephone Encounter (Signed)
Pre-visit assessment call was attempted prior to [place] appointment with Dr. Rogue Bussing on 04/25/2019. No answer - msg left.

## 2019-04-25 ENCOUNTER — Other Ambulatory Visit: Payer: Self-pay

## 2019-04-25 ENCOUNTER — Inpatient Hospital Stay: Payer: Medicare HMO | Attending: Internal Medicine | Admitting: Internal Medicine

## 2019-04-25 ENCOUNTER — Inpatient Hospital Stay: Payer: Medicare HMO

## 2019-04-25 DIAGNOSIS — Z87891 Personal history of nicotine dependence: Secondary | ICD-10-CM | POA: Diagnosis not present

## 2019-04-25 DIAGNOSIS — N184 Chronic kidney disease, stage 4 (severe): Secondary | ICD-10-CM | POA: Diagnosis not present

## 2019-04-25 DIAGNOSIS — Z5112 Encounter for antineoplastic immunotherapy: Secondary | ICD-10-CM | POA: Insufficient documentation

## 2019-04-25 DIAGNOSIS — D631 Anemia in chronic kidney disease: Secondary | ICD-10-CM | POA: Diagnosis not present

## 2019-04-25 DIAGNOSIS — C678 Malignant neoplasm of overlapping sites of bladder: Secondary | ICD-10-CM

## 2019-04-25 DIAGNOSIS — Z79899 Other long term (current) drug therapy: Secondary | ICD-10-CM | POA: Diagnosis not present

## 2019-04-25 DIAGNOSIS — Z7982 Long term (current) use of aspirin: Secondary | ICD-10-CM | POA: Insufficient documentation

## 2019-04-25 DIAGNOSIS — I10 Essential (primary) hypertension: Secondary | ICD-10-CM | POA: Diagnosis not present

## 2019-04-25 DIAGNOSIS — Z7189 Other specified counseling: Secondary | ICD-10-CM

## 2019-04-25 LAB — COMPREHENSIVE METABOLIC PANEL
ALT: 13 U/L (ref 0–44)
AST: 21 U/L (ref 15–41)
Albumin: 3.7 g/dL (ref 3.5–5.0)
Alkaline Phosphatase: 90 U/L (ref 38–126)
Anion gap: 5 (ref 5–15)
BUN: 31 mg/dL — ABNORMAL HIGH (ref 8–23)
CO2: 24 mmol/L (ref 22–32)
Calcium: 9 mg/dL (ref 8.9–10.3)
Chloride: 107 mmol/L (ref 98–111)
Creatinine, Ser: 2.47 mg/dL — ABNORMAL HIGH (ref 0.61–1.24)
GFR calc Af Amer: 27 mL/min — ABNORMAL LOW (ref >60)
GFR calc non Af Amer: 23 mL/min — ABNORMAL LOW (ref >60)
Glucose, Bld: 154 mg/dL — ABNORMAL HIGH (ref 70–99)
Potassium: 4.2 mmol/L (ref 3.5–5.1)
Sodium: 136 mmol/L (ref 135–145)
Total Bilirubin: 0.5 mg/dL (ref 0.3–1.2)
Total Protein: 7.1 g/dL (ref 6.5–8.1)

## 2019-04-25 LAB — CBC WITH DIFFERENTIAL/PLATELET
Abs Immature Granulocytes: 0.01 10*3/uL (ref 0.00–0.07)
Basophils Absolute: 0.1 10*3/uL (ref 0.0–0.1)
Basophils Relative: 1 %
Eosinophils Absolute: 0.9 10*3/uL — ABNORMAL HIGH (ref 0.0–0.5)
Eosinophils Relative: 13 %
HCT: 26.2 % — ABNORMAL LOW (ref 39.0–52.0)
Hemoglobin: 8.5 g/dL — ABNORMAL LOW (ref 13.0–17.0)
Immature Granulocytes: 0 %
Lymphocytes Relative: 28 %
Lymphs Abs: 1.9 10*3/uL (ref 0.7–4.0)
MCH: 32.2 pg (ref 26.0–34.0)
MCHC: 32.4 g/dL (ref 30.0–36.0)
MCV: 99.2 fL (ref 80.0–100.0)
Monocytes Absolute: 0.6 10*3/uL (ref 0.1–1.0)
Monocytes Relative: 9 %
Neutro Abs: 3.4 10*3/uL (ref 1.7–7.7)
Neutrophils Relative %: 49 %
Platelets: 278 10*3/uL (ref 150–400)
RBC: 2.64 MIL/uL — ABNORMAL LOW (ref 4.22–5.81)
RDW: 12 % (ref 11.5–15.5)
WBC: 6.9 10*3/uL (ref 4.0–10.5)
nRBC: 0 % (ref 0.0–0.2)

## 2019-04-25 MED ORDER — SODIUM CHLORIDE 0.9 % IV SOLN
1200.0000 mg | Freq: Once | INTRAVENOUS | Status: AC
  Administered 2019-04-25: 1200 mg via INTRAVENOUS
  Filled 2019-04-25: qty 20

## 2019-04-25 MED ORDER — SODIUM CHLORIDE 0.9 % IV SOLN
Freq: Once | INTRAVENOUS | Status: AC
  Administered 2019-04-25: 10:00:00 via INTRAVENOUS
  Filled 2019-04-25: qty 250

## 2019-04-25 NOTE — Progress Notes (Signed)
[facility] CONSULT NOTE  Patient Care Team: [name], MD as PCP - General (Internal Medicine)  CHIEF COMPLAINTS/PURPOSE OF CONSULTATION: Bladder cancer   Oncology History Overview Note  # AUG 2019-TRANSITIONAL CELL BLADDER CA [~ 4cm tumor] s/p cystoscopy [Dr.Stoiff]  with extensive angiolymphatic invasion; [name]; [name]; mediastinal uptake; right pubic rami uptake.  # 19th ep 2019- Tecentriq;    # Match 2020- HYPOTHYROIDISM [sec to [place]]  # CKD stage III-IV [creat 2.5]; [name]; NO other targets**  DIAGNOSIS: Bladder ca  STAGE:   IV  ;GOALS: palliative  CURRENT/MOST RECENT THERAPY:Tecentriq      Cancer of overlapping sites of bladder ([place])  02/28/2018 -  Chemotherapy   The patient had atezolizumab (TECENTRIQ) 1,[name], 1,200 mg, Intravenous, Once, 19 of 20 cycles Administration: 1,200 mg (02/28/2018), 1,200 mg (03/21/2018), 1,200 mg (04/11/2018), 1,200 mg (05/02/2018), 1,200 mg (06/13/2018), 1,200 mg (05/23/2018), 1,200 mg (07/04/2018), 1,200 mg (07/25/2018), 1,200 mg (08/15/2018), 1,200 mg (09/05/2018), 1,200 mg (10/25/2018), 1,200 mg (11/22/2018), 1,200 mg (12/20/2018), 1,200 mg (01/10/2019), 1,200 mg (01/31/2019), 1,200 mg (02/21/2019), 1,200 mg (03/14/2019), 1,200 mg (04/04/2019), 1,200 mg (04/25/2019)  for chemotherapy treatment.       HISTORY OF PRESENTING ILLNESS: Edward Cummings 85 y.o.  male with metastatic transitional carcinoma of the bladder currently on Tecentriq is here for follow-up.  Appetite is good.  Complains of nocturia.  No burning pain no dysuria.  This is chronic.  No chest pain.  No nausea or vomiting or diarrhea.   Review of Systems  Constitutional: Positive for  malaise/fatigue. Negative for chills, diaphoresis and fever.  HENT: Negative for nosebleeds and sore throat.   Eyes: Negative for double vision.  Respiratory: Negative for cough, hemoptysis, sputum production, shortness of breath and wheezing.   Cardiovascular: Negative for chest pain, palpitations, orthopnea and leg swelling.  Gastrointestinal: Negative for abdominal pain, blood in stool, diarrhea, heartburn, melena, nausea and vomiting.  Genitourinary: Negative for dysuria, frequency and urgency.  Musculoskeletal: Positive for back pain. Negative for joint pain.  Skin: Negative.  Negative for itching and rash.  Neurological: Negative for tingling, focal weakness, weakness and headaches.  Endo/Heme/Allergies: Does not bruise/bleed easily.  Psychiatric/Behavioral: Negative for depression. The patient is not nervous/anxious and does not have insomnia.      MEDICAL HISTORY:  Past Medical History:  Diagnosis Date  . Anemia   . Cancer ([place])    bladder  . Chronic kidney disease   . Depression   . Hypertension   . Neuromuscular disorder ([place])    Nerve damage to left face/eye since around 2002.    SURGICAL HISTORY: Past Surgical History:  Procedure Laterality Date  . CYSTOSCOPY W/ RETROGRADES Bilateral 01/25/2018   Procedure: CYSTOSCOPY WITH RETROGRADE PYELOGRAM;  [name], MD;  [name];  Service: Urology;  Laterality: Bilateral;  . CYSTOSCOPY W/ URETERAL STENT PLACEMENT Bilateral 01/06/2019   Procedure: CYSTOSCOPY WITH RETROGRADE PYELOGRAM/URETERAL STENT REMOVAL;  [name], MD;  [name];  Service: Urology;  Laterality: Bilateral;  . CYSTOSCOPY WITH STENT PLACEMENT Bilateral 01/25/2018   Procedure: CYSTOSCOPY WITH STENT PLACEMENT;  [name], MD;  [name];  Service: Urology;  Laterality: Bilateral;  . DORSAL SLIT N/A 01/06/2019   Procedure:  DORSAL SLIT;  [name], MD;  [name];   Service: Urology;  [name];  . EYE SURGERY     Cornea transplants bilaterally & cataract surgery.  . TRANSURETHRAL RESECTION OF BLADDER TUMOR N/A 01/25/2018   Procedure: TRANSURETHRAL RESECTION OF BLADDER TUMOR (TURBT);  [name], MD;  [name];  Service: Urology;  [name];    SOCIAL HISTORY: lives in [place]; with wife; quit smoking 18 years ago; beer every 2 months or so.mechanic/retd.   Social History   Socioeconomic History  . Marital status: Married    Spouse name: [name]  . Number of children: Not on file  . Years of education: Not on file  . Highest education level: Not on file  Occupational History  . Not on file  Social Needs  . Financial resource strain: Not hard at all  . Food insecurity    Worry: Never true    Inability: Never true  . Transportation needs    Medical: Not on file    Non-medical: Not on file  Tobacco Use  . Smoking status: Former Research scientist (life sciences)  . Smokeless tobacco: Current User    Types: Chew  . Tobacco comment: Stopped approximately 10 years ago.  Substance and Sexual Activity  . Alcohol use: Yes    Alcohol/week: 2.0 standard drinks    Types: 2 Cans of beer per week    Comment: Daily  . Drug use: Never  . Sexual activity: Yes    Birth control/protection: None  Lifestyle  . Physical activity    Days per week: Not on file    Minutes per session: Not on file  . Stress: Only a little  Relationships  . Social Herbalist on phone: Not on file    Gets together: Not on file    Attends religious service: Not on file    Active member of club or organization: Not on file    Attends meetings of clubs or organizations: Not on file    Relationship status: Not on file  . Intimate partner violence    Fear of current or ex partner: Not on file    Emotionally abused: Not on file    Physically abused: Not on file    Forced sexual activity: Not on file  Other Topics Concern  . Not on file  Social  History Narrative  . Not on file    FAMILY HISTORY: Family History  Problem Relation Age of Onset  . Prostate cancer Neg Hx   . Kidney cancer Neg Hx   . Bladder Cancer Neg Hx     ALLERGIES:  has No Known Allergies.  MEDICATIONS:  Current Outpatient Medications  Medication Sig Dispense Refill  . amLODipine (NORVASC) 5 MG tablet Take 1 tablet (5 mg total) by mouth daily. 30 tablet 3  . aspirin EC 81 MG tablet Take 81 mg by mouth daily.     Marland Kitchen docusate sodium (COLACE) 50 MG capsule Take 50 mg by mouth at bedtime.    . fexofenadine (ALLEGRA) 180 MG tablet Take 180 mg by mouth daily.    . fluticasone (FLONASE) 50 MCG/ACT nasal spray Place 2 sprays into both nostrils daily as needed for allergies or rhinitis.    Marland Kitchen HYDROcodone-acetaminophen (NORCO/VICODIN) 5-325 MG tablet Take 1 tablet by mouth every 8 (eight) hours as needed for moderate pain. 30 tablet 0  . levothyroxine (SYNTHROID) 88 MCG tablet Take 1 tablet (88 mcg total) by mouth  daily before breakfast. Empty stomach- 1 hour prior to breakfast. 30 tablet 3  . Multiple Vitamin (MULTIVITAMIN WITH MINERALS) TABS tablet Take 1 tablet by mouth daily.     Marland Kitchen oxybutynin (DITROPAN) 5 MG tablet Take 1 tablet by mouth 3 (three) times daily as needed.    . prednisoLONE acetate (PRED FORTE) 1 % ophthalmic suspension Place 1 drop into both eyes daily.    . tamsulosin (FLOMAX) 0.4 MG CAPS capsule Take 1 capsule (0.4 mg total) by mouth at bedtime. 90 capsule 1   No current facility-administered medications for this visit.       Marland Kitchen  PHYSICAL EXAMINATION: ECOG PERFORMANCE STATUS: 1 - Symptomatic but completely ambulatory  Vitals:   04/25/19 0841  BP: 131/61  Pulse: 60  Temp: (!) 97.4 F (36.3 C)   Filed Weights   04/25/19 0841  Weight: 128 lb (58.1 kg)    Physical Exam  Constitutional: He is oriented to person, place, and time.  Alone.   Walking himself.  Thin built moderately nourished male patient.  HENT:  Head: Normocephalic and  atraumatic.  Mouth/Throat: Oropharynx is clear and moist. No oropharyngeal exudate.  Eyes: Pupils are equal, round, and reactive to light.  Chronic drooping of the left eyelid.  Neck: Normal range of motion. Neck supple.  Cardiovascular: Normal rate and regular rhythm.  Pulmonary/Chest: No respiratory distress. He has no wheezes.  Abdominal: Soft. Bowel sounds are normal. He exhibits no distension and no mass. There is no abdominal tenderness. There is no rebound and no guarding.  Musculoskeletal: Normal range of motion.        General: No tenderness or edema.  Neurological: He is alert and oriented to person, place, and time.  Skin: Skin is warm.  Psychiatric: Affect normal.     LABORATORY DATA:  I have reviewed the data as listed Lab Results  Component Value Date   WBC 6.9 04/25/2019   HGB 8.5 (L) 04/25/2019   HCT 26.2 (L) 04/25/2019   MCV 99.2 04/25/2019   PLT 278 04/25/2019   Recent Labs    03/14/19 0820 04/04/19 0818 04/25/19 0804  NA 139 139 136  K 4.9 3.9 4.2  CL 109 109 107  CO2 _0 GLUCOSE 92 112* 154*  BUN 28* 38* 31*  CREATININE 2.03* 2.64* 2.47*  CALCIUM 9.1 9.1 9.0  GFRNONAA 29* 21* 23*  GFRAA 34* 25* 27*  PROT 7.0 7.1 7.1  ALBUMIN 4.1 4.1 3.7  AST _1 ALT _2 ALKPHOS 69 80 90  BILITOT 0.8 0.7 0.5    RADIOGRAPHIC STUDIES: I have personally reviewed the radiological images as listed and agreed with the findings in the report. Ct Abdomen Pelvis Wo Contrast  Result Date: 03/31/2019 CLINICAL DATA:  Restaging metastatic bladder cancer. EXAM: CT CHEST, ABDOMEN AND PELVIS WITHOUT CONTRAST TECHNIQUE: Multidetector CT imaging of the chest, abdomen and pelvis was performed following the standard protocol without IV contrast. COMPARISON:  CT scan 12/16/2018. FINDINGS: CT CHEST FINDINGS Cardiovascular: The heart is normal in size. No pericardial effusion. Stable advanced atherosclerotic calcifications involving the aorta, branch vessels and  coronary arteries. No aortic aneurysm. Mediastinum/Nodes: No mediastinal or hilar mass or lymphadenopathy. Small scattered lymph nodes are stable. The esophagus is grossly normal. Lungs/Pleura: Stable emphysematous changes and pulmonary scarring. 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 No acute pulmonary findings or worrisome pulmonary lesions. A few tiny scattered subpleural pulmonary nodules are unchanged. There are also  a few scattered calcified granulomas. Basilar interstitial disease is noted bilaterally. No pleural effusion. Musculoskeletal: No chest wall mass, supraclavicular or axillary adenopathy. Small scattered lymph nodes appears stable. CT ABDOMEN PELVIS FINDINGS Hepatobiliary: No focal hepatic lesions are identified without contrast. No intrahepatic biliary dilatation. Pancreas: Grossly normal without contrast. No lesions or inflammation. Spleen: Normal size.  No focal lesions. Adrenals/Urinary Tract: The adrenal glands are grossly normal. Mild bilateral hydroureteronephrosis but no obstructing ureteral calculi. The double-J ureteral stent seen on the prior study have been removed. There are bilateral simple appearing renal cysts and a stable hyperdense cyst involving the midpole region of the left kidney. No worrisome renal lesions are identified without contrast. No definite bladder lesions. Stomach/Bowel: The stomach, duodenum, small bowel and colon are unremarkable. No acute inflammatory changes, mass lesions or obstructive findings. The terminal ileum and appendix are normal. Vascular/Lymphatic: Stable advanced atherosclerotic calcifications involving the aorta and branch vessels but no aneurysm. No mesenteric or retroperitoneal mass or lymphadenopathy. No pelvic lymphadenopathy. No inguinal lymphadenopathy. Reproductive: Mild prostate gland enlargement with median lobe hypertrophy impressing on the base of the bladder. Other: No pelvic mass or  free pelvic fluid collections. Musculoskeletal: No significant bony findings. No pleuritic or sclerotic bone lesions. IMPRESSION: 1. Stable CT appearance of the chest, abdomen and pelvis without contrast. No findings suspicious for recurrent bladder cancer or metastatic disease. 2. Mild bilateral hydroureteronephrosis but no obstructing calculi or lesions. 3. Stable emphysematous changes, pulmonary scarring and calcified pleural plaques. 4. Stable advanced atherosclerotic calcifications involving the thoracic and abdominal aorta and branch vessels including extensive three-vessel coronary artery calcifications. Electronically Signed   By: Marijo Sanes M.D.   On: 03/31/2019 09:47   Ct Chest Wo Contrast  Result Date: 03/31/2019 CLINICAL DATA:  Restaging metastatic bladder cancer. EXAM: CT CHEST, ABDOMEN AND PELVIS WITHOUT CONTRAST TECHNIQUE: Multidetector CT imaging of the chest, abdomen and pelvis was performed following the standard protocol without IV contrast. COMPARISON:  CT scan 12/16/2018. FINDINGS: CT CHEST FINDINGS Cardiovascular: The heart is normal in size. No pericardial effusion. Stable advanced atherosclerotic calcifications involving the aorta, branch vessels and coronary arteries. No aortic aneurysm. Mediastinum/Nodes: No mediastinal or hilar mass or lymphadenopathy. Small scattered lymph nodes are stable. The esophagus is grossly normal. Lungs/Pleura: Stable emphysematous changes and pulmonary scarring. 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 No acute pulmonary findings or worrisome pulmonary lesions. A few tiny scattered subpleural pulmonary nodules are unchanged. There are also a few scattered calcified granulomas. Basilar interstitial disease is noted bilaterally. No pleural effusion. Musculoskeletal: No chest wall mass, supraclavicular or axillary adenopathy. Small scattered lymph nodes appears stable. CT ABDOMEN PELVIS FINDINGS  Hepatobiliary: No focal hepatic lesions are identified without contrast. No intrahepatic biliary dilatation. Pancreas: Grossly normal without contrast. No lesions or inflammation. Spleen: Normal size.  No focal lesions. Adrenals/Urinary Tract: The adrenal glands are grossly normal. Mild bilateral hydroureteronephrosis but no obstructing ureteral calculi. The double-J ureteral stent seen on the prior study have been removed. There are bilateral simple appearing renal cysts and a stable hyperdense cyst involving the midpole region of the left kidney. No worrisome renal lesions are identified without contrast. No definite bladder lesions. Stomach/Bowel: The stomach, duodenum, small bowel and colon are unremarkable. No acute inflammatory changes, mass lesions or obstructive findings. The terminal ileum and appendix are normal. Vascular/Lymphatic: Stable advanced atherosclerotic calcifications involving the aorta and branch vessels but no aneurysm. No mesenteric or retroperitoneal mass or lymphadenopathy. No pelvic lymphadenopathy. No inguinal lymphadenopathy. Reproductive:  Mild prostate gland enlargement with median lobe hypertrophy impressing on the base of the bladder. Other: No pelvic mass or free pelvic fluid collections. Musculoskeletal: No significant bony findings. No pleuritic or sclerotic bone lesions. IMPRESSION: 1. Stable CT appearance of the chest, abdomen and pelvis without contrast. No findings suspicious for recurrent bladder cancer or metastatic disease. 2. Mild bilateral hydroureteronephrosis but no obstructing calculi or lesions. 3. Stable emphysematous changes, pulmonary scarring and calcified pleural plaques. 4. Stable advanced atherosclerotic calcifications involving the thoracic and abdominal aorta and branch vessels including extensive three-vessel coronary artery calcifications. Electronically Signed   By: Marijo Sanes M.D.   On: 03/31/2019 09:47   Ultrasound Renal Complete  Result Date:  04/23/2019 CLINICAL DATA:  Hydronephrosis on recent CT EXAM: RENAL / URINARY TRACT ULTRASOUND COMPLETE COMPARISON:  CT abdomen and pelvis 03/31/2019, 01/04/2018 FINDINGS: Right Kidney: Renal measurements: 9.8 x 5.0 x 4.8 cm = volume: 122 mL. Cortical thinning. Borderline increased cortical echogenicity. No shadowing calcifications. Several small cysts are identified at the lower pole of the RIGHT kidney, 1.8 x 1.7 x 1.4 cm and 1.7 x 1.1 x 1.4 cm. No additional masses or hydronephrosis. Left Kidney: Renal measurements: 9.5 x 5.2 x 4.0 cm = volume: 103 mL. Cortical thinning. Increased cortical echogenicity. Peripelvic cyst at inferior pole 2.0 x 1.7 x 1.4 cm. Additional exophytic nodule at mid LEFT kidney, 11 x 8 x 10 mm, nonspecific; this grossly corresponds to a hyperdense nodule on CTs. No hydronephrosis. Bladder: Appears normal for degree of bladder distention. Other: Enlarged prostate gland 4.6 x 3.9 x 4.5 cm. IMPRESSION: Prostatic enlargement. BILATERAL renal cysts. No definite hydronephrosis. 11 mm complicated cystic lesion mid LEFT kidney, grossly corresponding in size and position with a hyperdense nodule on CT question hyperdense cyst versus solid nodule, present since 01/04/2018. Electronically Signed   By: Lavonia Dana M.D.   On: 04/23/2019 16:08    ASSESSMENT & PLAN:   Cancer of overlapping sites of bladder ([place]) # High-grade transitional cell carcinoma of the bladder metastatic to retroperitoneal lymph node.  Stage IV;  OCT 2020 CT- PR; stable. thickening of bladder.   # on Tecentiq; Labs today reviewed;  acceptable for treatment today.   # Iatrogenic hypothyroidism-on Synthroid 88 mcg; STABLE.   # Anemia sec to CKD/ on Iv iron.Hb-8.5; STABLE; If worse; would recommend IV iron.   # CKD stage III-IV-stable; 2.3; US kidney-no hydroneprhosis.   # DISPOSITION:  # treatment today. # Follow-up in 3 weeks-MD labs-cbc/cmp/Tecentriq IV-Dr.B  All questions were answered. The patient knows to  call the clinic with any problems, questions or concerns.    Cammie Sickle, MD 04/25/2019 12:20 PM

## 2019-04-25 NOTE — Assessment & Plan Note (Signed)
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  Stage IV;  OCT 2020 CT- PR; stable. thickening of bladder.   # on Tecentiq; Labs today reviewed;  acceptable for treatment today.   # Iatrogenic hypothyroidism-on Synthroid 88 mcg; STABLE.   # Anemia sec to CKD/ on Iv iron.Hb-8.5; STABLE; If worse; would recommend IV iron.   # CKD stage III-IV-stable; 2.3; US kidney-no hydroneprhosis.   # DISPOSITION:  # treatment today. # Follow-up in 3 weeks-MD labs-cbc/cmp/Tecentriq IV-Dr.B

## 2019-04-28 ENCOUNTER — Telehealth: Payer: Self-pay

## 2019-04-28 NOTE — Telephone Encounter (Signed)
Called pt no answer. Left detailed message for pt informing him of the information below per DPR.

## 2019-04-28 NOTE — Telephone Encounter (Signed)
-----   Message from Abbie Sons, MD sent at 04/27/2019  3:24 PM EST ----- No hydronephrosis seen on renal ultrasound.  Follow-up as scheduled

## 2019-05-15 NOTE — Progress Notes (Signed)
Patient is coming in for appointment he is doing well no complaints

## 2019-05-16 ENCOUNTER — Other Ambulatory Visit: Payer: Self-pay

## 2019-05-16 ENCOUNTER — Inpatient Hospital Stay: Payer: Medicare HMO | Attending: Internal Medicine

## 2019-05-16 ENCOUNTER — Inpatient Hospital Stay (HOSPITAL_BASED_OUTPATIENT_CLINIC_OR_DEPARTMENT_OTHER): Payer: Medicare HMO | Admitting: Internal Medicine

## 2019-05-16 ENCOUNTER — Inpatient Hospital Stay: Payer: Medicare HMO

## 2019-05-16 DIAGNOSIS — Z79899 Other long term (current) drug therapy: Secondary | ICD-10-CM | POA: Diagnosis not present

## 2019-05-16 DIAGNOSIS — E032 Hypothyroidism due to medicaments and other exogenous substances: Secondary | ICD-10-CM | POA: Insufficient documentation

## 2019-05-16 DIAGNOSIS — C678 Malignant neoplasm of overlapping sites of bladder: Secondary | ICD-10-CM

## 2019-05-16 DIAGNOSIS — Z87891 Personal history of nicotine dependence: Secondary | ICD-10-CM | POA: Insufficient documentation

## 2019-05-16 DIAGNOSIS — D631 Anemia in chronic kidney disease: Secondary | ICD-10-CM | POA: Insufficient documentation

## 2019-05-16 DIAGNOSIS — N183 Chronic kidney disease, stage 3 unspecified: Secondary | ICD-10-CM | POA: Diagnosis not present

## 2019-05-16 DIAGNOSIS — Z7189 Other specified counseling: Secondary | ICD-10-CM

## 2019-05-16 DIAGNOSIS — I10 Essential (primary) hypertension: Secondary | ICD-10-CM | POA: Diagnosis not present

## 2019-05-16 DIAGNOSIS — Z7982 Long term (current) use of aspirin: Secondary | ICD-10-CM | POA: Insufficient documentation

## 2019-05-16 DIAGNOSIS — C772 Secondary and unspecified malignant neoplasm of intra-abdominal lymph nodes: Secondary | ICD-10-CM | POA: Diagnosis not present

## 2019-05-16 DIAGNOSIS — Z5112 Encounter for antineoplastic immunotherapy: Secondary | ICD-10-CM | POA: Diagnosis not present

## 2019-05-16 LAB — COMPREHENSIVE METABOLIC PANEL
ALT: 17 U/L (ref 0–44)
AST: 28 U/L (ref 15–41)
Albumin: 3.9 g/dL (ref 3.5–5.0)
Alkaline Phosphatase: 75 U/L (ref 38–126)
Anion gap: 6 (ref 5–15)
BUN: 28 mg/dL — ABNORMAL HIGH (ref 8–23)
CO2: 23 mmol/L (ref 22–32)
Calcium: 8.9 mg/dL (ref 8.9–10.3)
Chloride: 107 mmol/L (ref 98–111)
Creatinine, Ser: 2.18 mg/dL — ABNORMAL HIGH (ref 0.61–1.24)
GFR calc Af Amer: 31 mL/min — ABNORMAL LOW (ref >60)
GFR calc non Af Amer: 27 mL/min — ABNORMAL LOW (ref >60)
Glucose, Bld: 103 mg/dL — ABNORMAL HIGH (ref 70–99)
Potassium: 4.9 mmol/L (ref 3.5–5.1)
Sodium: 136 mmol/L (ref 135–145)
Total Bilirubin: 0.7 mg/dL (ref 0.3–1.2)
Total Protein: 6.8 g/dL (ref 6.5–8.1)

## 2019-05-16 LAB — CBC WITH DIFFERENTIAL/PLATELET
Abs Immature Granulocytes: 0.01 10*3/uL (ref 0.00–0.07)
Basophils Absolute: 0 10*3/uL (ref 0.0–0.1)
Basophils Relative: 1 %
Eosinophils Absolute: 0.7 10*3/uL — ABNORMAL HIGH (ref 0.0–0.5)
Eosinophils Relative: 12 %
HCT: 27.9 % — ABNORMAL LOW (ref 39.0–52.0)
Hemoglobin: 8.9 g/dL — ABNORMAL LOW (ref 13.0–17.0)
Immature Granulocytes: 0 %
Lymphocytes Relative: 27 %
Lymphs Abs: 1.5 10*3/uL (ref 0.7–4.0)
MCH: 32.4 pg (ref 26.0–34.0)
MCHC: 31.9 g/dL (ref 30.0–36.0)
MCV: 101.5 fL — ABNORMAL HIGH (ref 80.0–100.0)
Monocytes Absolute: 0.5 10*3/uL (ref 0.1–1.0)
Monocytes Relative: 9 %
Neutro Abs: 3 10*3/uL (ref 1.7–7.7)
Neutrophils Relative %: 51 %
Platelets: 186 10*3/uL (ref 150–400)
RBC: 2.75 MIL/uL — ABNORMAL LOW (ref 4.22–5.81)
RDW: 12.4 % (ref 11.5–15.5)
WBC: 5.7 10*3/uL (ref 4.0–10.5)
nRBC: 0 % (ref 0.0–0.2)

## 2019-05-16 LAB — IRON AND TIBC
Iron: 89 ug/dL (ref 45–182)
Saturation Ratios: 41 % — ABNORMAL HIGH (ref 17.9–39.5)
TIBC: 217 ug/dL — ABNORMAL LOW (ref 250–450)
UIBC: 128 ug/dL

## 2019-05-16 LAB — FERRITIN: Ferritin: 429 ng/mL — ABNORMAL HIGH (ref 24–336)

## 2019-05-16 MED ORDER — SODIUM CHLORIDE 0.9 % IV SOLN
1200.0000 mg | Freq: Once | INTRAVENOUS | Status: AC
  Administered 2019-05-16: 1200 mg via INTRAVENOUS
  Filled 2019-05-16: qty 20

## 2019-05-16 MED ORDER — SODIUM CHLORIDE 0.9 % IV SOLN
Freq: Once | INTRAVENOUS | Status: AC
  Administered 2019-05-16: 11:00:00 via INTRAVENOUS
  Filled 2019-05-16: qty 250

## 2019-05-16 NOTE — Progress Notes (Signed)
[facility] CONSULT NOTE  Patient Care Team: [name], MD as PCP - General (Internal Medicine)  CHIEF COMPLAINTS/PURPOSE OF CONSULTATION: Bladder cancer   Oncology History Overview Note  # AUG 2019-TRANSITIONAL CELL BLADDER CA [~ 4cm tumor] s/p cystoscopy [Dr.Stoiff]  with extensive angiolymphatic invasion; [name]; [name]; mediastinal uptake; right pubic rami uptake.  # 19th ep 2019- Tecentriq;    # Match 2020- HYPOTHYROIDISM [sec to [facility]]  # CKD stage III-IV [creat 2.5]; [name]; NO other targets**  DIAGNOSIS: Bladder ca  STAGE:   IV  ;GOALS: palliative  CURRENT/MOST RECENT THERAPY:Tecentriq      Cancer of overlapping sites of bladder ([place])  02/28/2018 -  Chemotherapy   The patient had atezolizumab (TECENTRIQ) 1,[name], 1,200 mg, Intravenous, Once, 20 of 21 cycles Administration: 1,200 mg (02/28/2018), 1,200 mg (03/21/2018), 1,200 mg (04/11/2018), 1,200 mg (05/02/2018), 1,200 mg (06/13/2018), 1,200 mg (05/23/2018), 1,200 mg (07/04/2018), 1,200 mg (07/25/2018), 1,200 mg (08/15/2018), 1,200 mg (09/05/2018), 1,200 mg (10/25/2018), 1,200 mg (11/22/2018), 1,200 mg (12/20/2018), 1,200 mg (01/10/2019), 1,200 mg (01/31/2019), 1,200 mg (02/21/2019), 1,200 mg (03/14/2019), 1,200 mg (04/04/2019), 1,200 mg (04/25/2019)  for chemotherapy treatment.       HISTORY OF PRESENTING ILLNESS: Edward Cummings 85 y.o.  male with metastatic transitional carcinoma of the bladder currently on Tecentriq is here for follow-up.  Patient denies any nausea vomiting.  Appetite is good.  Mild to moderate fatigue.  No weight loss.  Chronic shortness of breath.  Not any worse.   Review of Systems  Constitutional: Positive for  malaise/fatigue. Negative for chills, [name], hemoptysis, sputum production and wheezing.   Cardiovascular: Negative for chest pain, palpitations, orthopnea and leg swelling.  Gastrointestinal: Negative for abdominal pain, blood in stool, diarrhea, heartburn, melena, nausea and vomiting.  Genitourinary: Negative for dysuria, frequency and urgency.  Musculoskeletal: Positive for back pain. Negative for joint pain.  Skin: Negative.  Negative for itching and rash.  Neurological: Negative for tingling, focal weakness, weakness and headaches.  Endo/Heme/Allergies: Does not bruise/bleed easily.  Psychiatric/Behavioral: Negative for depression. The patient is not nervous/anxious and does not have insomnia.      MEDICAL HISTORY:  Past Medical History:  Diagnosis Date  . Anemia   . Cancer ([place])    bladder  . Chronic kidney disease   . Depression   . Hypertension   . Neuromuscular disorder ([place])    Nerve damage to left face/eye since around 2002.    SURGICAL HISTORY: Past Surgical History:  Procedure Laterality Date  . CYSTOSCOPY W/ RETROGRADES Bilateral 01/25/2018   Procedure: CYSTOSCOPY WITH RETROGRADE PYELOGRAM;  [name], MD;  [name];  Service: Urology;  Laterality: Bilateral;  . CYSTOSCOPY W/ URETERAL STENT PLACEMENT Bilateral 01/06/2019   Procedure: CYSTOSCOPY WITH RETROGRADE PYELOGRAM/URETERAL STENT REMOVAL;  [name], MD;  [name];  Service: Urology;  Laterality: Bilateral;  . CYSTOSCOPY WITH STENT PLACEMENT Bilateral 01/25/2018   Procedure: CYSTOSCOPY WITH STENT PLACEMENT;  [name], MD;  [name];  Service: Urology;  Laterality: Bilateral;  . DORSAL SLIT N/A 01/06/2019  Procedure: DORSAL SLIT;  [name], MD;  Location:  [facility];  Service: Urology;  [name];  . EYE SURGERY     Cornea transplants bilaterally & cataract surgery.  . TRANSURETHRAL RESECTION OF BLADDER TUMOR N/A 01/25/2018   Procedure: TRANSURETHRAL RESECTION OF BLADDER TUMOR (TURBT);  [name], MD;  [name];  Service: Urology;  [name];    SOCIAL HISTORY: lives in [place]; with wife; quit smoking 18 years ago; beer every 2 months or so.mechanic/retd.   Social History   Socioeconomic History  . Marital status: Married    Spouse name: [name]  . Number of children: Not on file  . Years of education: Not on file  . Highest education level: Not on file  Occupational History  . Not on file  Social Needs  . Financial resource strain: Not hard at all  . Food insecurity    Worry: Never true    Inability: Never true  . Transportation needs    Medical: Not on file    Non-medical: Not on file  Tobacco Use  . Smoking status: Former Research scientist (life sciences)  . Smokeless tobacco: Current User    Types: Chew  . Tobacco comment: Stopped approximately 10 years ago.  Substance and Sexual Activity  . Alcohol use: Yes    Alcohol/week: 2.0 standard drinks    Types: 2 Cans of beer per week    Comment: Daily  . Drug use: Never  . Sexual activity: Yes    Birth control/protection: None  Lifestyle  . Physical activity    Days per week: Not on file    Minutes per session: Not on file  . Stress: Only a little  Relationships  . Social Herbalist on phone: Not on file    Gets together: Not on file    Attends religious service: Not on file    Active member of club or organization: Not on file    Attends meetings of clubs or organizations: Not on file    Relationship status: Not on file  . Intimate partner violence    Fear of current or ex partner: Not on file    Emotionally abused: Not on file    Physically abused: Not on file    Forced sexual activity: Not on file  Other Topics Concern  . Not on file   Social History Narrative  . Not on file    FAMILY HISTORY: Family History  Problem Relation Age of Onset  . Prostate cancer Neg Hx   . Kidney cancer Neg Hx   . Bladder Cancer Neg Hx     ALLERGIES:  has No Known Allergies.  MEDICATIONS:  Current Outpatient Medications  Medication Sig Dispense Refill  . amLODipine (NORVASC) 5 MG tablet Take 1 tablet (5 mg total) by mouth daily. 30 tablet 3  . aspirin EC 81 MG tablet Take 81 mg by mouth daily.     Marland Kitchen docusate sodium (COLACE) 50 MG capsule Take 50 mg by mouth at bedtime.    . fexofenadine (ALLEGRA) 180 MG tablet Take 180 mg by mouth daily.    . fluticasone (FLONASE) 50 MCG/ACT nasal spray Place 2 sprays into both nostrils daily as needed for allergies or rhinitis.    Marland Kitchen HYDROcodone-acetaminophen (NORCO/VICODIN) 5-325 MG tablet Take 1 tablet by mouth every 8 (eight) hours as needed for moderate pain. 30 tablet 0  . levothyroxine (SYNTHROID) 88 MCG tablet Take 1 tablet (88 mcg total) by  mouth daily before breakfast. Empty stomach- 1 hour prior to breakfast. 30 tablet 3  . Multiple Vitamin (MULTIVITAMIN WITH MINERALS) TABS tablet Take 1 tablet by mouth daily.     Marland Kitchen oxybutynin (DITROPAN) 5 MG tablet Take 1 tablet by mouth 3 (three) times daily as needed.    . prednisoLONE acetate (PRED FORTE) 1 % ophthalmic suspension Place 1 drop into both eyes daily.    . tamsulosin (FLOMAX) 0.4 MG CAPS capsule Take 1 capsule (0.4 mg total) by mouth at bedtime. 90 capsule 1   No current facility-administered medications for this visit.    Facility-Administered Medications Ordered in Other Visits  Medication Dose Route Frequency Provider Last Rate Last Dose  . 0.9 %  sodium chloride infusion   Intravenous Once Cammie Sickle, MD      . [facility]) 1,[name]  1,200 mg Intravenous Once Charlaine Dalton R, MD          .  PHYSICAL EXAMINATION: ECOG PERFORMANCE STATUS: 1 - Symptomatic  but completely ambulatory  Vitals:   05/15/19 1454 05/16/19 0942  BP: (!) 149/75 (!) 149/75  Pulse: (!) 51 (!) 51  Resp: 18 18  Temp: (!) 96.4 F (35.8 C) (!) 96.4 F (35.8 C)  SpO2: 100% 100%   Filed Weights   05/15/19 1454 05/16/19 0942  Weight: 125 lb 9.6 oz (57 kg) 125 lb 9.6 oz (57 kg)    Physical Exam  Constitutional: He is oriented to person, place, and time.  With his wife  Walking himself.  Thin built moderately nourished male patient.  HENT:  Head: Normocephalic and atraumatic.  Mouth/Throat: Oropharynx is clear and moist. No oropharyngeal exudate.  Eyes: Pupils are equal, round, and reactive to light.  Chronic drooping of the left eyelid.  Neck: Normal range of motion. Neck supple.  Cardiovascular: Normal rate and regular rhythm.  Pulmonary/Chest: No respiratory distress. He has no wheezes.  Abdominal: Soft. Bowel sounds are normal. He exhibits no distension and no mass. There is no abdominal tenderness. There is no rebound and no guarding.  Musculoskeletal: Normal range of motion.        General: No tenderness or edema.  Neurological: He is alert and oriented to person, place, and time.  Skin: Skin is warm.  Psychiatric: Affect normal.     LABORATORY DATA:  I have reviewed the data as listed Lab Results  Component Value Date   WBC 5.7 05/16/2019   HGB 8.9 (L) 05/16/2019   HCT 27.9 (L) 05/16/2019   MCV 101.5 (H) 05/16/2019   PLT 186 05/16/2019   Recent Labs    04/04/19 0818 04/25/19 0804 05/16/19 0847  NA 139 136 136  K 3.9 4.2 4.9  CL 109 107 107  CO2 '22 24 23  ' GLUCOSE 112* 154* 103*  BUN 38* 31* 28*  CREATININE 2.64* 2.47* 2.18*  CALCIUM 9.1 9.0 8.9  GFRNONAA 21* 23* 27*  GFRAA 25* 27* 31*  PROT 7.1 7.1 6.8  ALBUMIN 4.1 3.7 3.9  AST '23 21 28  ' ALT '15 13 17  ' ALKPHOS 80 90 75  BILITOT 0.7 0.5 0.7    RADIOGRAPHIC STUDIES: I have personally reviewed the radiological images as listed and agreed with the findings in the report. Ultrasound  Renal Complete  Result Date: 04/23/2019 CLINICAL DATA:  Hydronephrosis on recent CT EXAM: RENAL / URINARY TRACT ULTRASOUND COMPLETE COMPARISON:  CT abdomen and pelvis 03/31/2019, 01/04/2018 FINDINGS: Right Kidney: Renal  measurements: 9.8 x 5.0 x 4.8 cm = volume: 122 mL. Cortical thinning. Borderline increased cortical echogenicity. No shadowing calcifications. Several small cysts are identified at the lower pole of the RIGHT kidney, 1.8 x 1.7 x 1.4 cm and 1.7 x 1.1 x 1.4 cm. No additional masses or hydronephrosis. Left Kidney: Renal measurements: 9.5 x 5.2 x 4.0 cm = volume: 103 mL. Cortical thinning. Increased cortical echogenicity. Peripelvic cyst at inferior pole 2.0 x 1.7 x 1.4 cm. Additional exophytic nodule at mid LEFT kidney, 11 x 8 x 10 mm, nonspecific; this grossly corresponds to a hyperdense nodule on CTs. No hydronephrosis. Bladder: Appears normal for degree of bladder distention. Other: Enlarged prostate gland 4.6 x 3.9 x 4.5 cm. IMPRESSION: Prostatic enlargement. BILATERAL renal cysts. No definite hydronephrosis. 11 mm complicated cystic lesion mid LEFT kidney, grossly corresponding in size and position with a hyperdense nodule on CT question hyperdense cyst versus solid nodule, present since 01/04/2018. Electronically Signed   By: Lavonia Dana M.D.   On: 04/23/2019 16:08    ASSESSMENT & PLAN:   Cancer of overlapping sites of bladder ([place]) # High-grade transitional cell carcinoma of the bladder metastatic to retroperitoneal lymph node.  Stage IV;  OCT 2020 CT- PR; stable.  Thickening of bladder.   # on Tecentiq; Labs today reviewed;  acceptable for treatment today.  We will plan to get imaging again mid January 2021.   # Iatrogenic hypothyroidism-on Synthroid 88 mcg; STABLE.   # Anemia sec to CKD/ on Iv iron.Hb-8.9; STABLE; If worse; would recommend IV iron at next visit.  Check iron studies today.  # CKD stage III-IV-stable; 2.3; US kidney-no hydroneprhosis.   #Discussed regarding for  placement.  Will inform us if interested at next visit.  # DISPOSITION: add iron studies/ferritin # treatment today. # Follow-up in week of 28th-MD labs-cbc/cmp/Tecentriq IV-Dr.B  All questions were answered. The patient knows to call the clinic with any problems, questions or concerns.    Cammie Sickle, MD 05/16/2019 10:28 AM

## 2019-05-16 NOTE — Assessment & Plan Note (Addendum)
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  Stage IV;  OCT 2020 CT- PR; stable.  Thickening of bladder.   # on Tecentiq; Labs today reviewed;  acceptable for treatment today.  We will plan to get imaging again mid January 2021.   # Iatrogenic hypothyroidism-on Synthroid 88 mcg; STABLE.   # Anemia sec to CKD/ on Iv iron.Hb-8.9; STABLE; If worse; would recommend IV iron at next visit.  Check iron studies today.  # CKD stage III-IV-stable; 2.3; US kidney-no hydroneprhosis.   #Discussed regarding for placement.  Will inform us if interested at next visit.  # DISPOSITION: add iron studies/ferritin # treatment today. # Follow-up in week of 28th-MD labs-cbc/cmp/Tecentriq IV-Dr.B

## 2019-05-26 DIAGNOSIS — D509 Iron deficiency anemia, unspecified: Secondary | ICD-10-CM | POA: Diagnosis not present

## 2019-05-26 DIAGNOSIS — R0602 Shortness of breath: Secondary | ICD-10-CM | POA: Diagnosis not present

## 2019-05-26 DIAGNOSIS — Z0000 Encounter for general adult medical examination without abnormal findings: Secondary | ICD-10-CM | POA: Diagnosis not present

## 2019-05-26 DIAGNOSIS — R6251 Failure to thrive (child): Secondary | ICD-10-CM | POA: Diagnosis not present

## 2019-05-26 DIAGNOSIS — C679 Malignant neoplasm of bladder, unspecified: Secondary | ICD-10-CM | POA: Diagnosis not present

## 2019-06-05 ENCOUNTER — Encounter: Payer: Self-pay | Admitting: Internal Medicine

## 2019-06-05 ENCOUNTER — Other Ambulatory Visit: Payer: Self-pay

## 2019-06-09 ENCOUNTER — Other Ambulatory Visit: Payer: Self-pay

## 2019-06-09 ENCOUNTER — Inpatient Hospital Stay (HOSPITAL_BASED_OUTPATIENT_CLINIC_OR_DEPARTMENT_OTHER): Payer: Medicare HMO | Admitting: Internal Medicine

## 2019-06-09 ENCOUNTER — Inpatient Hospital Stay: Payer: Medicare HMO

## 2019-06-09 DIAGNOSIS — Z79899 Other long term (current) drug therapy: Secondary | ICD-10-CM | POA: Diagnosis not present

## 2019-06-09 DIAGNOSIS — C678 Malignant neoplasm of overlapping sites of bladder: Secondary | ICD-10-CM | POA: Diagnosis not present

## 2019-06-09 DIAGNOSIS — Z87891 Personal history of nicotine dependence: Secondary | ICD-10-CM | POA: Diagnosis not present

## 2019-06-09 DIAGNOSIS — Z7982 Long term (current) use of aspirin: Secondary | ICD-10-CM | POA: Diagnosis not present

## 2019-06-09 DIAGNOSIS — I10 Essential (primary) hypertension: Secondary | ICD-10-CM | POA: Diagnosis not present

## 2019-06-09 DIAGNOSIS — Z7189 Other specified counseling: Secondary | ICD-10-CM

## 2019-06-09 DIAGNOSIS — Z5112 Encounter for antineoplastic immunotherapy: Secondary | ICD-10-CM | POA: Diagnosis not present

## 2019-06-09 DIAGNOSIS — E032 Hypothyroidism due to medicaments and other exogenous substances: Secondary | ICD-10-CM | POA: Diagnosis not present

## 2019-06-09 DIAGNOSIS — C772 Secondary and unspecified malignant neoplasm of intra-abdominal lymph nodes: Secondary | ICD-10-CM | POA: Diagnosis not present

## 2019-06-09 DIAGNOSIS — N183 Chronic kidney disease, stage 3 unspecified: Secondary | ICD-10-CM | POA: Diagnosis not present

## 2019-06-09 DIAGNOSIS — D631 Anemia in chronic kidney disease: Secondary | ICD-10-CM | POA: Diagnosis not present

## 2019-06-09 LAB — CBC WITH DIFFERENTIAL/PLATELET
Abs Immature Granulocytes: 0.01 10*3/uL (ref 0.00–0.07)
Basophils Absolute: 0.1 10*3/uL (ref 0.0–0.1)
Basophils Relative: 1 %
Eosinophils Absolute: 1 10*3/uL — ABNORMAL HIGH (ref 0.0–0.5)
Eosinophils Relative: 13 %
HCT: 31.1 % — ABNORMAL LOW (ref 39.0–52.0)
Hemoglobin: 9.9 g/dL — ABNORMAL LOW (ref 13.0–17.0)
Immature Granulocytes: 0 %
Lymphocytes Relative: 38 %
Lymphs Abs: 2.9 10*3/uL (ref 0.7–4.0)
MCH: 32.4 pg (ref 26.0–34.0)
MCHC: 31.8 g/dL (ref 30.0–36.0)
MCV: 101.6 fL — ABNORMAL HIGH (ref 80.0–100.0)
Monocytes Absolute: 0.5 10*3/uL (ref 0.1–1.0)
Monocytes Relative: 6 %
Neutro Abs: 3.1 10*3/uL (ref 1.7–7.7)
Neutrophils Relative %: 42 %
Platelets: 230 10*3/uL (ref 150–400)
RBC: 3.06 MIL/uL — ABNORMAL LOW (ref 4.22–5.81)
RDW: 12.4 % (ref 11.5–15.5)
WBC: 7.6 10*3/uL (ref 4.0–10.5)
nRBC: 0 % (ref 0.0–0.2)

## 2019-06-09 LAB — COMPREHENSIVE METABOLIC PANEL
ALT: 14 U/L (ref 0–44)
AST: 25 U/L (ref 15–41)
Albumin: 4.2 g/dL (ref 3.5–5.0)
Alkaline Phosphatase: 81 U/L (ref 38–126)
Anion gap: 9 (ref 5–15)
BUN: 33 mg/dL — ABNORMAL HIGH (ref 8–23)
CO2: 23 mmol/L (ref 22–32)
Calcium: 9.2 mg/dL (ref 8.9–10.3)
Chloride: 109 mmol/L (ref 98–111)
Creatinine, Ser: 2.69 mg/dL — ABNORMAL HIGH (ref 0.61–1.24)
GFR calc Af Amer: 24 mL/min — ABNORMAL LOW (ref >60)
GFR calc non Af Amer: 21 mL/min — ABNORMAL LOW (ref >60)
Glucose, Bld: 100 mg/dL — ABNORMAL HIGH (ref 70–99)
Potassium: 3.9 mmol/L (ref 3.5–5.1)
Sodium: 141 mmol/L (ref 135–145)
Total Bilirubin: 0.7 mg/dL (ref 0.3–1.2)
Total Protein: 7.4 g/dL (ref 6.5–8.1)

## 2019-06-09 MED ORDER — SODIUM CHLORIDE 0.9 % IV SOLN
1200.0000 mg | Freq: Once | INTRAVENOUS | Status: AC
  Administered 2019-06-09: 1200 mg via INTRAVENOUS
  Filled 2019-06-09: qty 20

## 2019-06-09 MED ORDER — SODIUM CHLORIDE 0.9 % IV SOLN
Freq: Once | INTRAVENOUS | Status: AC
  Filled 2019-06-09: qty 250

## 2019-06-09 NOTE — Progress Notes (Signed)
[facility] CONSULT NOTE  Patient Care Team: [name], MD as PCP - General (Internal Medicine)  CHIEF COMPLAINTS/PURPOSE OF CONSULTATION: Bladder cancer   Oncology History Overview Note  # AUG 2019-TRANSITIONAL CELL BLADDER CA [~ 4cm tumor] s/p cystoscopy [Dr.Stoiff]  with extensive angiolymphatic invasion; [name]; [name]; mediastinal uptake; right pubic rami uptake.  # 19th ep 2019- Tecentriq;    # Match 2020- HYPOTHYROIDISM [sec to [facility]]  # CKD stage III-IV [creat 2.5]; [name]; NO other targets**  DIAGNOSIS: Bladder ca  STAGE:   IV  ;GOALS: palliative  CURRENT/MOST RECENT THERAPY:Tecentriq [C]     Cancer of overlapping sites of bladder ([place])  02/28/2018 -  Chemotherapy   The patient had atezolizumab (TECENTRIQ) 1,[name], 1,200 mg, Intravenous, Once, [name],200 mg (02/28/2018), 1,200 mg (03/21/2018), 1,200 mg (04/11/2018), 1,200 mg (05/02/2018), 1,200 mg (06/13/2018), 1,200 mg (05/23/2018), 1,200 mg (07/04/2018), 1,200 mg (07/25/2018), 1,200 mg (08/15/2018), 1,200 mg (09/05/2018), 1,200 mg (10/25/2018), 1,200 mg (11/22/2018), 1,200 mg (12/20/2018), 1,200 mg (01/10/2019), 1,200 mg (01/31/2019), 1,200 mg (02/21/2019), 1,200 mg (03/14/2019), 1,200 mg (04/04/2019), 1,200 mg (04/25/2019), 1,200 mg (05/16/2019)  for chemotherapy treatment.       HISTORY OF PRESENTING ILLNESS: Edward Cummings 85 y.o.  male with metastatic transitional carcinoma of the bladder currently on Tecentriq is here for follow-up.  Patient has mild fatigue.  Otherwise denies any headaches denies any nausea vomiting diarrhea.  Chronic shortness of breath especially exertion.  Not any worse.  No blood in stools no  blood in urine.   Review of Systems  Constitutional: Positive for malaise/fatigue. Negative for chills, [name], hemoptysis, sputum production and wheezing.   Cardiovascular: Negative for chest pain, palpitations, orthopnea and leg swelling.  Gastrointestinal: Negative for abdominal pain, blood in stool, diarrhea, heartburn, melena, nausea and vomiting.  Genitourinary: Negative for dysuria, frequency and urgency.  Musculoskeletal: Positive for back pain. Negative for joint pain.  Skin: Negative.  Negative for itching and rash.  Neurological: Negative for tingling, focal weakness, weakness and headaches.  Endo/Heme/Allergies: Does not bruise/bleed easily.  Psychiatric/Behavioral: Negative for depression. The patient is not nervous/anxious and does not have insomnia.      MEDICAL HISTORY:  Past Medical History:  Diagnosis Date  . Anemia   . Cancer ([place])    bladder  . Chronic kidney disease   . Depression   . Hypertension   . Neuromuscular disorder ([place])    Nerve damage to left face/eye since around 2002.    SURGICAL HISTORY: Past Surgical History:  Procedure Laterality Date  . CYSTOSCOPY W/ RETROGRADES Bilateral 01/25/2018   Procedure: CYSTOSCOPY WITH RETROGRADE PYELOGRAM;  [name], MD;  [name];  Service: Urology;  Laterality: Bilateral;  . CYSTOSCOPY W/ URETERAL STENT PLACEMENT Bilateral 01/06/2019   Procedure: CYSTOSCOPY WITH RETROGRADE PYELOGRAM/URETERAL STENT REMOVAL;  [name], MD;  [name];  Service: Urology;  Laterality: Bilateral;  . CYSTOSCOPY WITH STENT PLACEMENT Bilateral 01/25/2018   Procedure: CYSTOSCOPY WITH STENT PLACEMENT;  [name], MD;  [name];  Service: Urology;  Laterality: Bilateral;  . DORSAL SLIT N/A 01/06/2019    Procedure: DORSAL SLIT;  [name], MD;  [name];  Service: Urology;  [name];  . EYE SURGERY     Cornea transplants bilaterally & cataract surgery.  . TRANSURETHRAL RESECTION OF BLADDER TUMOR N/A 01/25/2018   Procedure: TRANSURETHRAL RESECTION OF BLADDER TUMOR (TURBT);  [name], MD;  [name];  Service: Urology;  [name];    SOCIAL HISTORY: lives in [place]; with wife; quit smoking 18 years ago; beer every 2 months or so.mechanic/retd.   Social History   Socioeconomic History  . Marital status: Married    Spouse name: [name]  . Number of children: Not on file  . Years of education: Not on file  . Highest education level: Not on file  Occupational History  . Not on file  Tobacco Use  . Smoking status: Former Research scientist (life sciences)  . Smokeless tobacco: Current User    Types: Chew  . Tobacco comment: Stopped approximately 10 years ago.  Substance and Sexual Activity  . Alcohol use: Yes    Alcohol/week: 2.0 standard drinks    Types: 2 Cans of beer per week    Comment: Daily  . Drug use: Never  . Sexual activity: Yes    Birth control/protection: None  Other Topics Concern  . Not on file  Social History Narrative  . Not on file   Social Determinants of Health   Financial Resource Strain:   . Difficulty of Paying Living Expenses: Not on file  Food Insecurity:   . Worried About Charity fundraiser in the Last Year: Not on file  . Ran Out of Food in the Last Year: Not on file  Transportation Needs:   . Lack of Transportation (Medical): Not on file  . Lack of Transportation (Non-Medical): Not on file  Physical Activity:   . Days of Exercise per Week: Not on file  . Minutes of Exercise per Session: Not on file  Stress:   . Feeling of Stress : Not on file  Social Connections:   . Frequency of Communication with Friends and Family: Not on file  . Frequency of Social Gatherings with Friends and Family: Not on file   . Attends Religious Services: Not on file  . Active Member of Clubs or Organizations: Not on file  . Attends Archivist Meetings: Not on file  . Marital Status: Not on file  Intimate Partner Violence:   . Fear of Current or Ex-Partner: Not on file  . Emotionally Abused: Not on file  . Physically Abused: Not on file  . Sexually Abused: Not on file    FAMILY HISTORY: Family History  Problem Relation Age of Onset  . Prostate cancer Neg Hx   . Kidney cancer Neg Hx   . Bladder Cancer Neg Hx     ALLERGIES:  has No Known Allergies.  MEDICATIONS:  Current Outpatient Medications  Medication Sig Dispense Refill  . amLODipine (NORVASC) 5 MG tablet Take 1 tablet (5 mg total) by mouth daily. 30 tablet 3  . aspirin EC 81 MG tablet Take 81 mg by mouth daily.     Marland Kitchen docusate sodium (COLACE) 50 MG capsule Take 50 mg by mouth at bedtime.    . fexofenadine (ALLEGRA) 180 MG tablet Take 180 mg by mouth daily.    . fluticasone (FLONASE) 50 MCG/ACT nasal spray Place 2 sprays into both nostrils daily as needed for allergies or  rhinitis.    Marland Kitchen levothyroxine (SYNTHROID) 88 MCG tablet Take 1 tablet (88 mcg total) by mouth daily before breakfast. Empty stomach- 1 hour prior to breakfast. 30 tablet 3  . Multiple Vitamin (MULTIVITAMIN WITH MINERALS) TABS tablet Take 1 tablet by mouth daily.     Marland Kitchen oxybutynin (DITROPAN) 5 MG tablet Take 1 tablet by mouth 3 (three) times daily as needed.    . prednisoLONE acetate (PRED FORTE) 1 % ophthalmic suspension Place 1 drop into both eyes daily.    . tamsulosin (FLOMAX) 0.4 MG CAPS capsule Take 1 capsule (0.4 mg total) by mouth at bedtime. 90 capsule 1  . HYDROcodone-acetaminophen (NORCO/VICODIN) 5-325 MG tablet Take 1 tablet by mouth every 8 (eight) hours as needed for moderate pain. (Patient not taking: Reported on 06/05/2019) 30 tablet 0   No current facility-administered medications for this visit.      Marland Kitchen  PHYSICAL EXAMINATION: ECOG PERFORMANCE STATUS:  1 - Symptomatic but completely ambulatory  Vitals:   06/09/19 0858  BP: 128/60  Pulse: 70  Resp: 16  Temp: (!) 96.8 F (36 C)   Filed Weights   06/09/19 0858  Weight: 127 lb (57.6 kg)    Physical Exam  Constitutional: He is oriented to person, place, and time.  With his wife  Walking himself.  Thin built moderately nourished male patient.  HENT:  Head: Normocephalic and atraumatic.  Mouth/Throat: Oropharynx is clear and moist. No oropharyngeal exudate.  Eyes: Pupils are equal, round, and reactive to light.  Chronic drooping of the left eyelid.  Cardiovascular: Normal rate and regular rhythm.  Pulmonary/Chest: No respiratory distress. He has no wheezes.  Abdominal: Soft. Bowel sounds are normal. He exhibits no distension and no mass. There is no abdominal tenderness. There is no rebound and no guarding.  Musculoskeletal:        General: No tenderness or edema. Normal range of motion.     Cervical back: Normal range of motion and neck supple.  Neurological: He is alert and oriented to person, place, and time.  Skin: Skin is warm.  Psychiatric: Affect normal.     LABORATORY DATA:  I have reviewed the data as listed Lab Results  Component Value Date   WBC 7.6 06/09/2019   HGB 9.9 (L) 06/09/2019   HCT 31.1 (L) 06/09/2019   MCV 101.6 (H) 06/09/2019   PLT 230 06/09/2019   Recent Labs    04/25/19 0804 05/16/19 0847 06/09/19 0837  NA 136 136 141  K 4.2 4.9 3.9  CL 107 107 109  CO2 _0 GLUCOSE 154* 103* 100*  BUN 31* 28* 33*  CREATININE 2.47* 2.18* 2.69*  CALCIUM 9.0 8.9 9.2  GFRNONAA 23* 27* 21*  GFRAA 27* 31* 24*  PROT 7.1 6.8 7.4  ALBUMIN 3.7 3.9 4.2  AST _1 ALT _2 ALKPHOS 90 75 81  BILITOT 0.5 0.7 0.7    RADIOGRAPHIC STUDIES: I have personally reviewed the radiological images as listed and agreed with the findings in the report. No results found.  ASSESSMENT & PLAN:   Cancer of overlapping sites of bladder ([place]) # High-grade  transitional cell carcinoma of the bladder metastatic to retroperitoneal lymph node.  Stage IV;  OCT 2020 CT- PR; stable.  Thickening of bladder.   # on Tecentiq; Labs today reviewed;  acceptable for treatment today.  Will order scans at next visit.  Given the logistics patient is interested in switching to every 2-week treatment option.  Will check with insurance.  # Iatrogenic hypothyroidism-on Synthroid 88 mcg; STABLE.   # Anemia sec to CKD/ on Iv iron.Hb-9.2;  STABLE; no IDA.   # CKD stage III-IV-stable; creatinine 2.6; recommend drinking fluids.  #Discussed regarding for placement.  Will inform us if interested at next visit.  # DISPOSITION:  # treatment today. # Follow-up in 3 weeks- -MD labs-cbc/cmp/Tecentriq IV-Dr.B  All questions were answered. The patient knows to call the clinic with any problems, questions or concerns.    Cammie Sickle, MD 06/09/2019 12:57 PM

## 2019-06-09 NOTE — Assessment & Plan Note (Addendum)
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  Stage IV;  OCT 2020 CT- PR; stable.  Thickening of bladder.   # on Tecentiq; Labs today reviewed;  acceptable for treatment today.  Will order scans at next visit.  Given the logistics patient is interested in switching to every 2-week treatment option.  Will check with insurance.  # Iatrogenic hypothyroidism-on Synthroid 88 mcg; STABLE.   # Anemia sec to CKD/ on Iv iron.Hb-9.2;  STABLE; no IDA.   # CKD stage III-IV-stable; creatinine 2.6; recommend drinking fluids.  #Discussed regarding for placement.  Will inform us if interested at next visit.  # DISPOSITION:  # treatment today. # Follow-up in 3 weeks- -MD labs-cbc/cmp/Tecentriq IV-Dr.B  Otho Perl

## 2019-06-10 DIAGNOSIS — Z833 Family history of diabetes mellitus: Secondary | ICD-10-CM | POA: Diagnosis not present

## 2019-06-10 DIAGNOSIS — I499 Cardiac arrhythmia, unspecified: Secondary | ICD-10-CM | POA: Diagnosis not present

## 2019-06-10 DIAGNOSIS — I10 Essential (primary) hypertension: Secondary | ICD-10-CM | POA: Diagnosis not present

## 2019-06-10 DIAGNOSIS — Z85828 Personal history of other malignant neoplasm of skin: Secondary | ICD-10-CM | POA: Diagnosis not present

## 2019-06-10 DIAGNOSIS — R32 Unspecified urinary incontinence: Secondary | ICD-10-CM | POA: Diagnosis not present

## 2019-06-10 DIAGNOSIS — N529 Male erectile dysfunction, unspecified: Secondary | ICD-10-CM | POA: Diagnosis not present

## 2019-06-10 DIAGNOSIS — Z8249 Family history of ischemic heart disease and other diseases of the circulatory system: Secondary | ICD-10-CM | POA: Diagnosis not present

## 2019-06-10 DIAGNOSIS — G8929 Other chronic pain: Secondary | ICD-10-CM | POA: Diagnosis not present

## 2019-06-10 DIAGNOSIS — Z7982 Long term (current) use of aspirin: Secondary | ICD-10-CM | POA: Diagnosis not present

## 2019-06-10 DIAGNOSIS — N400 Enlarged prostate without lower urinary tract symptoms: Secondary | ICD-10-CM | POA: Diagnosis not present

## 2019-06-11 ENCOUNTER — Other Ambulatory Visit: Payer: Self-pay | Admitting: Internal Medicine

## 2019-06-11 NOTE — Progress Notes (Signed)
Per patient preference-switch from T[place]entriq to [place].

## 2019-06-11 NOTE — Progress Notes (Signed)
ON PATHWAY REGIMEN - Bladder  No Change  Continue With Treatment as Ordered.     A cycle is every 21 days:     Atezolizumab   **Always confirm dose/schedule in your pharmacy ordering system**  Patient Characteristics: Metastatic Disease, First Line, No Prior Neoadjuvant/Adjuvant Therapy, Poor Renal Function (CrCl < 50 mL/min), Unknown PD-L1 Expression AJCC M Category: M1a AJCC N Category: NX AJCC T Category: T1 Current evidence of distant metastases<= Yes AJCC 8 Stage Grouping: IVA Line of Therapy: First Line Prior Neoadjuvant/Adjuvant Therapy<= No Renal Function: Poor Renal Function (CrCl < 50 mL/min) PD-L1 Expression Status: Unknown PD-L1 Expression Intent of Therapy: Non-Curative / Palliative Intent, Discussed with Patient

## 2019-06-26 DIAGNOSIS — I10 Essential (primary) hypertension: Secondary | ICD-10-CM | POA: Diagnosis not present

## 2019-06-26 DIAGNOSIS — D509 Iron deficiency anemia, unspecified: Secondary | ICD-10-CM | POA: Diagnosis not present

## 2019-06-26 DIAGNOSIS — H53002 Unspecified amblyopia, left eye: Secondary | ICD-10-CM | POA: Diagnosis not present

## 2019-06-26 DIAGNOSIS — I119 Hypertensive heart disease without heart failure: Secondary | ICD-10-CM | POA: Diagnosis not present

## 2019-06-27 ENCOUNTER — Encounter: Payer: Self-pay | Admitting: Internal Medicine

## 2019-06-27 ENCOUNTER — Other Ambulatory Visit: Payer: Self-pay

## 2019-06-30 ENCOUNTER — Inpatient Hospital Stay (HOSPITAL_BASED_OUTPATIENT_CLINIC_OR_DEPARTMENT_OTHER): Payer: Medicare HMO | Admitting: Internal Medicine

## 2019-06-30 ENCOUNTER — Inpatient Hospital Stay: Payer: Medicare HMO

## 2019-06-30 ENCOUNTER — Inpatient Hospital Stay: Payer: Medicare HMO | Attending: Internal Medicine

## 2019-06-30 ENCOUNTER — Other Ambulatory Visit: Payer: Self-pay

## 2019-06-30 DIAGNOSIS — C678 Malignant neoplasm of overlapping sites of bladder: Secondary | ICD-10-CM

## 2019-06-30 DIAGNOSIS — D631 Anemia in chronic kidney disease: Secondary | ICD-10-CM | POA: Diagnosis not present

## 2019-06-30 DIAGNOSIS — Z87891 Personal history of nicotine dependence: Secondary | ICD-10-CM | POA: Insufficient documentation

## 2019-06-30 DIAGNOSIS — Z5112 Encounter for antineoplastic immunotherapy: Secondary | ICD-10-CM | POA: Insufficient documentation

## 2019-06-30 DIAGNOSIS — Z7982 Long term (current) use of aspirin: Secondary | ICD-10-CM | POA: Insufficient documentation

## 2019-06-30 DIAGNOSIS — E032 Hypothyroidism due to medicaments and other exogenous substances: Secondary | ICD-10-CM | POA: Diagnosis not present

## 2019-06-30 DIAGNOSIS — Z7189 Other specified counseling: Secondary | ICD-10-CM

## 2019-06-30 DIAGNOSIS — I10 Essential (primary) hypertension: Secondary | ICD-10-CM | POA: Insufficient documentation

## 2019-06-30 DIAGNOSIS — N184 Chronic kidney disease, stage 4 (severe): Secondary | ICD-10-CM | POA: Diagnosis not present

## 2019-06-30 DIAGNOSIS — Z9221 Personal history of antineoplastic chemotherapy: Secondary | ICD-10-CM | POA: Insufficient documentation

## 2019-06-30 DIAGNOSIS — Z79899 Other long term (current) drug therapy: Secondary | ICD-10-CM | POA: Diagnosis not present

## 2019-06-30 LAB — CBC WITH DIFFERENTIAL/PLATELET
Abs Immature Granulocytes: 0.01 10*3/uL (ref 0.00–0.07)
Basophils Absolute: 0.1 10*3/uL (ref 0.0–0.1)
Basophils Relative: 1 %
Eosinophils Absolute: 0.8 10*3/uL — ABNORMAL HIGH (ref 0.0–0.5)
Eosinophils Relative: 11 %
HCT: 28.5 % — ABNORMAL LOW (ref 39.0–52.0)
Hemoglobin: 9.1 g/dL — ABNORMAL LOW (ref 13.0–17.0)
Immature Granulocytes: 0 %
Lymphocytes Relative: 35 %
Lymphs Abs: 2.5 10*3/uL (ref 0.7–4.0)
MCH: 32 pg (ref 26.0–34.0)
MCHC: 31.9 g/dL (ref 30.0–36.0)
MCV: 100.4 fL — ABNORMAL HIGH (ref 80.0–100.0)
Monocytes Absolute: 0.6 10*3/uL (ref 0.1–1.0)
Monocytes Relative: 9 %
Neutro Abs: 3.1 10*3/uL (ref 1.7–7.7)
Neutrophils Relative %: 44 %
Platelets: 192 10*3/uL (ref 150–400)
RBC: 2.84 MIL/uL — ABNORMAL LOW (ref 4.22–5.81)
RDW: 12.4 % (ref 11.5–15.5)
WBC: 7 10*3/uL (ref 4.0–10.5)
nRBC: 0 % (ref 0.0–0.2)

## 2019-06-30 LAB — COMPREHENSIVE METABOLIC PANEL
ALT: 12 U/L (ref 0–44)
AST: 21 U/L (ref 15–41)
Albumin: 4 g/dL (ref 3.5–5.0)
Alkaline Phosphatase: 73 U/L (ref 38–126)
Anion gap: 7 (ref 5–15)
BUN: 29 mg/dL — ABNORMAL HIGH (ref 8–23)
CO2: 23 mmol/L (ref 22–32)
Calcium: 8.9 mg/dL (ref 8.9–10.3)
Chloride: 110 mmol/L (ref 98–111)
Creatinine, Ser: 2.19 mg/dL — ABNORMAL HIGH (ref 0.61–1.24)
GFR calc Af Amer: 31 mL/min — ABNORMAL LOW (ref >60)
GFR calc non Af Amer: 27 mL/min — ABNORMAL LOW (ref >60)
Glucose, Bld: 132 mg/dL — ABNORMAL HIGH (ref 70–99)
Potassium: 3.6 mmol/L (ref 3.5–5.1)
Sodium: 140 mmol/L (ref 135–145)
Total Bilirubin: 0.6 mg/dL (ref 0.3–1.2)
Total Protein: 6.8 g/dL (ref 6.5–8.1)

## 2019-06-30 MED ORDER — SODIUM CHLORIDE 0.9 % IV SOLN
240.0000 mg | Freq: Once | INTRAVENOUS | Status: AC
  Administered 2019-06-30: 240 mg via INTRAVENOUS
  Filled 2019-06-30: qty 24

## 2019-06-30 MED ORDER — SODIUM CHLORIDE 0.9 % IV SOLN
Freq: Once | INTRAVENOUS | Status: AC
  Filled 2019-06-30: qty 250

## 2019-06-30 NOTE — Progress Notes (Signed)
[place] [facility] CONSULT NOTE  Patient Care Team: [name], MD as PCP - General (Internal Medicine)  CHIEF COMPLAINTS/PURPOSE OF CONSULTATION: Bladder cancer   Oncology History Overview Note  # AUG 2019-TRANSITIONAL CELL BLADDER CA [~ 4cm tumor] s/p cystoscopy [Dr.Stoiff]  with extensive angiolymphatic invasion; [name]; [name]; mediastinal uptake; right pubic rami uptake.  # [name]; Jan 18th 2021- switched to [place]- [pt preference; [name]; [name]; [name]  ;GOALS: palliative  CURRENT/MOST RECENT THERAPY:OPDIVO [C]     Cancer of overlapping sites of bladder ([place])  02/28/2018 - 06/29/2019 Chemotherapy   The patient had atezolizumab (TECENTRIQ) 1,[name], 1,200 mg, Intravenous, Once, [name],200 mg (02/28/2018), 1,200 mg (03/21/2018), 1,200 mg (04/11/2018), 1,200 mg (05/02/2018), 1,200 mg (06/13/2018), 1,200 mg (05/23/2018), 1,200 mg (07/04/2018), 1,200 mg (07/25/2018), 1,200 mg (08/15/2018), 1,200 mg (09/05/2018), 1,200 mg (10/25/2018), 1,200 mg (11/22/2018), 1,200 mg (12/20/2018), 1,200 mg (01/10/2019), 1,200 mg (01/31/2019), 1,200 mg (02/21/2019), 1,200 mg (03/14/2019), 1,200 mg (04/04/2019), 1,200 mg (04/25/2019), 1,200 mg (05/16/2019), 1,200 mg (06/09/2019)  for chemotherapy treatment.    06/30/2019 -  Chemotherapy   The patient had nivolumab (OPDIVO) 240 mg in sodium chloride 0.9 % 100 mL chemo infusion, 240 mg, Intravenous, Once, 1 of 6 cycles Administration: 240 mg (06/30/2019)  for chemotherapy treatment.        HISTORY OF PRESENTING ILLNESS: Edward Cummings 86 y.o.  male with metastatic transitional carcinoma of the bladder currently on Tecentriq is here for follow-up.  Denies any urinary frequency or blood in urine.  Appetite is good.  No weight loss.  Mild fatigue.   Review of Systems  Constitutional: Positive for malaise/fatigue. Negative for chills, [name], hemoptysis, sputum production and wheezing.   Cardiovascular: Negative for chest pain, palpitations, orthopnea and leg swelling.  Gastrointestinal: Negative for abdominal pain, blood in stool, diarrhea, heartburn, melena, nausea and vomiting.  Genitourinary: Negative for dysuria, frequency and urgency.  Musculoskeletal: Positive for back pain. Negative for joint pain.  Skin: Negative.  Negative for itching and rash.  Neurological: Negative for tingling, focal weakness, weakness and headaches.  Endo/Heme/Allergies: Does not bruise/bleed easily.  Psychiatric/Behavioral: Negative for depression. The patient is not nervous/anxious and does not have insomnia.      MEDICAL HISTORY:  Past Medical History:  Diagnosis Date  . Anemia   . Cancer ([place])    bladder  . Chronic kidney disease   . Depression   . Hypertension   . Neuromuscular disorder ([place])    Nerve damage to left face/eye since around 2002.    SURGICAL HISTORY: Past Surgical History:  Procedure Laterality Date  . CYSTOSCOPY W/ RETROGRADES Bilateral 01/25/2018   Procedure: CYSTOSCOPY WITH RETROGRADE PYELOGRAM;  [name], MD;  [name];  Service: Urology;  Laterality: Bilateral;  . CYSTOSCOPY W/ URETERAL STENT PLACEMENT Bilateral 01/06/2019   Procedure: CYSTOSCOPY WITH RETROGRADE PYELOGRAM/URETERAL STENT REMOVAL;  Surgeon: John Giovanni  C, MD;  [name];  Service:  Urology;  Laterality: Bilateral;  . CYSTOSCOPY WITH STENT PLACEMENT Bilateral 01/25/2018   Procedure: CYSTOSCOPY WITH STENT PLACEMENT;  [name], MD;  [name];  Service: Urology;  Laterality: Bilateral;  . DORSAL SLIT N/A 01/06/2019   Procedure: DORSAL SLIT;  [name], MD;  [name];  Service: Urology;  [name];  . EYE SURGERY     Cornea transplants bilaterally & cataract surgery.  . TRANSURETHRAL RESECTION OF BLADDER TUMOR N/A 01/25/2018   Procedure: TRANSURETHRAL RESECTION OF BLADDER TUMOR (TURBT);  [name], MD;  [name];  Service: Urology;  [name];    SOCIAL HISTORY: lives in [place]; with wife; quit smoking 18 years ago; beer every 2 months or so.mechanic/retd.   Social History   Socioeconomic History  . Marital status: Married    Spouse name: [name]  . Number of children: Not on file  . Years of education: Not on file  . Highest education level: Not on file  Occupational History  . Not on file  Tobacco Use  . Smoking status: Former Research scientist (life sciences)  . Smokeless tobacco: Current User    Types: Chew  . Tobacco comment: Stopped approximately 10 years ago.  Substance and Sexual Activity  . Alcohol use: Yes    Alcohol/week: 2.0 standard drinks    Types: 2 Cans of beer per week    Comment: Daily  . Drug use: Never  . Sexual activity: Yes    Birth control/protection: None  Other Topics Concern  . Not on file  Social History Narrative  . Not on file   Social Determinants of Health   Financial Resource Strain:   . Difficulty of Paying Living Expenses: Not on file  Food Insecurity:   . Worried About Charity fundraiser in the Last Year: Not on file  . Ran Out of Food in the Last Year: Not on file  Transportation Needs:   . Lack of Transportation (Medical): Not on file  . Lack of Transportation (Non-Medical): Not on file  Physical Activity:   . Days of Exercise per Week: Not on  file  . Minutes of Exercise per Session: Not on file  Stress:   . Feeling of Stress : Not on file  Social Connections:   . Frequency of Communication with Friends and Family: Not on file  . Frequency of Social Gatherings with Friends and Family: Not on file  . Attends Religious Services: Not on file  . Active Member of Clubs or Organizations: Not on file  . Attends Archivist Meetings: Not on file  . Marital Status: Not on file  Intimate Partner Violence:   . Fear of Current or Ex-Partner: Not on file  . Emotionally Abused: Not on file  . Physically Abused: Not on file  . Sexually Abused: Not on file    FAMILY HISTORY: Family History  Problem Relation Age of Onset  . Prostate cancer Neg Hx   . Kidney cancer Neg Hx   . Bladder Cancer Neg Hx     ALLERGIES:  has No Known Allergies.  MEDICATIONS:  Current Outpatient Medications  Medication Sig Dispense Refill  . amLODipine (NORVASC) 5 MG tablet Take 1 tablet (5 mg total) by mouth daily. 30 tablet 3  . aspirin EC 81 MG tablet Take 81 mg by mouth daily.     Marland Kitchen docusate sodium (COLACE) 50 MG capsule Take 50 mg  by mouth at bedtime.    . fexofenadine (ALLEGRA) 180 MG tablet Take 180 mg by mouth daily.    . fluticasone (FLONASE) 50 MCG/ACT nasal spray Place 2 sprays into both nostrils daily as needed for allergies or rhinitis.    Marland Kitchen levothyroxine (SYNTHROID) 88 MCG tablet Take 1 tablet (88 mcg total) by mouth daily before breakfast. Empty stomach- 1 hour prior to breakfast. 30 tablet 3  . Multiple Vitamin (MULTIVITAMIN WITH MINERALS) TABS tablet Take 1 tablet by mouth daily.     Marland Kitchen oxybutynin (DITROPAN) 5 MG tablet Take 1 tablet by mouth 3 (three) times daily as needed.    . prednisoLONE acetate (PRED FORTE) 1 % ophthalmic suspension Place 1 drop into both eyes daily.    . tamsulosin (FLOMAX) 0.4 MG CAPS capsule Take 1 capsule (0.4 mg total) by mouth at bedtime. 90 capsule 1  . HYDROcodone-acetaminophen (NORCO/VICODIN) 5-325 MG  tablet Take 1 tablet by mouth every 8 (eight) hours as needed for moderate pain. (Patient not taking: Reported on 06/05/2019) 30 tablet 0   No current facility-administered medications for this visit.      Marland Kitchen  PHYSICAL EXAMINATION: ECOG PERFORMANCE STATUS: 1 - Symptomatic but completely ambulatory  Vitals:   06/30/19 0835  BP: (!) 128/50  Pulse: (!) 57  Resp: 18  Temp: (!) 97.3 F (36.3 C)   Filed Weights   06/30/19 0835  Weight: 129 lb 12.8 oz (58.9 kg)    Physical Exam  Constitutional: He is oriented to person, place, and time.  With his wife  Walking himself.  Thin built moderately nourished male patient.  HENT:  Head: Normocephalic and atraumatic.  Mouth/Throat: Oropharynx is clear and moist. No oropharyngeal exudate.  Eyes: Pupils are equal, round, and reactive to light.  Chronic drooping of the left eyelid.  Cardiovascular: Normal rate and regular rhythm.  Pulmonary/Chest: No respiratory distress. He has no wheezes.  Abdominal: Soft. Bowel sounds are normal. He exhibits no distension and no mass. There is no abdominal tenderness. There is no rebound and no guarding.  Musculoskeletal:        General: No tenderness or edema. Normal range of motion.     Cervical back: Normal range of motion and neck supple.  Neurological: He is alert and oriented to person, place, and time.  Skin: Skin is warm.  Psychiatric: Affect normal.     LABORATORY DATA:  I have reviewed the data as listed Lab Results  Component Value Date   WBC 7.0 06/30/2019   HGB 9.1 (L) 06/30/2019   HCT 28.5 (L) 06/30/2019   MCV 100.4 (H) 06/30/2019   PLT 192 06/30/2019   Recent Labs    05/16/19 0847 06/09/19 0837 06/30/19 0809  NA 136 141 140  K 4.9 3.9 3.6  CL 107 109 110  CO2 '23 23 23  ' GLUCOSE 103* 100* 132*  BUN 28* 33* 29*  CREATININE 2.18* 2.69* 2.19*  CALCIUM 8.9 9.2 8.9  GFRNONAA 27* 21* 27*  GFRAA 31* 24* 31*  PROT 6.8 7.4 6.8  ALBUMIN 3.9 4.2 4.0  AST '28 25 21  ' ALT '17 14 12   ' ALKPHOS 75 81 73  BILITOT 0.7 0.7 0.6    RADIOGRAPHIC STUDIES: I have personally reviewed the radiological images as listed and agreed with the findings in the report. No results found.  ASSESSMENT & PLAN:   Cancer of overlapping sites of bladder ([place]) # High-grade transitional cell carcinoma of the bladder metastatic to retroperitoneal lymph node.  Stage  IV;  OCT 2020 CT- PR; stable; chronic thickening of bladder stable.  Currently on Tecentriq.  #Proceed with Opdivo given the patient preference/logistics. Labs today reviewed;  acceptable for treatment today.   # Iatrogenic hypothyroidism-on Synthroid 88 mcg; stable.   # Anemia sec to CKD/ on Iv iron.Hb-9.1; STABLE.   # CKD stage III-IV-stable; creatinine 2.3; STABLE.   # declines port.  # I discussed regarding Covid-19 precautions.  I reviewed the vaccine effectiveness and potential side effects in detail.  Also discussed long-term effectiveness and safety profile are unclear at this time.  I discussed December, 2020 [employer] position statement-that all patients are recommended COVID-19 vaccinations [when available]-as long as they do not have allergy to components of the vaccine.  However, I think the benefits of the vaccination outweigh the potential risks. Re: JARWP-10 vaccination.    # DISPOSITION:  # treatment today. # Follow-up in 2 weeks- -MD labs-cbc/cmp/opdivo IV-Dr.B  Addendum: I spoke to patient's son Ronalee Belts regarding the above plan of care.  In agreement.   All questions were answered. The patient knows to call the clinic with any problems, questions or concerns.    Edward Sickle, MD 07/02/2019 8:15 AM

## 2019-06-30 NOTE — Assessment & Plan Note (Addendum)
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  Stage IV;  OCT 2020 CT- PR; stable; chronic thickening of bladder stable.  Currently on Tecentriq.  #Proceed with Opdivo given the patient preference/logistics. Labs today reviewed;  acceptable for treatment today.   # Iatrogenic hypothyroidism-on Synthroid 88 mcg; stable.   # Anemia sec to CKD/ on Iv iron.Hb-9.1; STABLE.   # CKD stage III-IV-stable; creatinine 2.3; STABLE.   # declines port.  # I discussed regarding Covid-19 precautions.  I reviewed the vaccine effectiveness and potential side effects in detail.  Also discussed long-term effectiveness and safety profile are unclear at this time.  I discussed December, 2020 ASCO position statement-that all patients are recommended COVID-19 vaccinations [when available]-as long as they do not have allergy to components of the vaccine.  However, I think the benefits of the vaccination outweigh the potential risks. Re: XIPJA-25 vaccination.    # DISPOSITION:  # treatment today. # Follow-up in 2 weeks- -MD labs-cbc/cmp/opdivo IV-Dr.B  Addendum: I spoke to patient's son Ronalee Belts regarding the above plan of care.  In agreement.

## 2019-06-30 NOTE — Patient Instructions (Signed)
For more information/scheduling recommend call [place] health department- [phone], 8:30am-4:30pm.  

## 2019-06-30 NOTE — Progress Notes (Signed)
Creatinine: 2.19. MD, Dr. Rogue Bussing, notified and aware. Per MD order: proceed with scheduled Opdivo treatment today.

## 2019-07-01 ENCOUNTER — Telehealth: Payer: Self-pay | Admitting: *Deleted

## 2019-07-01 NOTE — Telephone Encounter (Signed)
Son requests a return call to discuss the 2 injections that was discussed with patient yesterday as the patient does not understand what was said. [phone]

## 2019-07-01 NOTE — Telephone Encounter (Signed)
Spoke with patient's son. Patient's care plan discussed. Explained that at his parents apt yesterday, Dr. B discussed covid vaccination and risk factor for the covid-19 virus and virus prevention. He encouraged them to consider the vaccination given their risk factors. Son states that his mother shared that she was unable to get on a "wait list" today and had asked him to continue to call. He stated that his mother did not know why she needed to call and thought the vaccination was "part of the chemo/infusion therapy." I explained to him the goals of care for his dad. He gave verbal understanding that the covid vaccine was not "part of patient's therapy." He will continue to reach out to the [facility] and [facility] wait list to try and get his parents signed up for the vaccine. He will reiterate the plan of care to his mother/dad.

## 2019-07-11 ENCOUNTER — Other Ambulatory Visit: Payer: Self-pay

## 2019-07-11 NOTE — Progress Notes (Signed)
Patient pre screened for office appointment, no questions or concerns today. Patient reminded of upcoming appointment time and date. 

## 2019-07-14 ENCOUNTER — Inpatient Hospital Stay (HOSPITAL_BASED_OUTPATIENT_CLINIC_OR_DEPARTMENT_OTHER): Payer: Medicare HMO | Admitting: Internal Medicine

## 2019-07-14 ENCOUNTER — Other Ambulatory Visit: Payer: Self-pay

## 2019-07-14 ENCOUNTER — Inpatient Hospital Stay: Payer: Medicare HMO | Attending: Internal Medicine

## 2019-07-14 ENCOUNTER — Other Ambulatory Visit: Payer: Self-pay | Admitting: *Deleted

## 2019-07-14 ENCOUNTER — Inpatient Hospital Stay: Payer: Medicare HMO

## 2019-07-14 VITALS — HR 54

## 2019-07-14 DIAGNOSIS — Z9221 Personal history of antineoplastic chemotherapy: Secondary | ICD-10-CM | POA: Diagnosis not present

## 2019-07-14 DIAGNOSIS — C678 Malignant neoplasm of overlapping sites of bladder: Secondary | ICD-10-CM

## 2019-07-14 DIAGNOSIS — Z7982 Long term (current) use of aspirin: Secondary | ICD-10-CM | POA: Insufficient documentation

## 2019-07-14 DIAGNOSIS — Z79899 Other long term (current) drug therapy: Secondary | ICD-10-CM | POA: Diagnosis not present

## 2019-07-14 DIAGNOSIS — E039 Hypothyroidism, unspecified: Secondary | ICD-10-CM | POA: Insufficient documentation

## 2019-07-14 DIAGNOSIS — D631 Anemia in chronic kidney disease: Secondary | ICD-10-CM | POA: Insufficient documentation

## 2019-07-14 DIAGNOSIS — Z87891 Personal history of nicotine dependence: Secondary | ICD-10-CM | POA: Diagnosis not present

## 2019-07-14 DIAGNOSIS — C772 Secondary and unspecified malignant neoplasm of intra-abdominal lymph nodes: Secondary | ICD-10-CM | POA: Diagnosis present

## 2019-07-14 DIAGNOSIS — N400 Enlarged prostate without lower urinary tract symptoms: Secondary | ICD-10-CM | POA: Diagnosis not present

## 2019-07-14 DIAGNOSIS — Z7189 Other specified counseling: Secondary | ICD-10-CM

## 2019-07-14 DIAGNOSIS — I10 Essential (primary) hypertension: Secondary | ICD-10-CM | POA: Insufficient documentation

## 2019-07-14 DIAGNOSIS — N184 Chronic kidney disease, stage 4 (severe): Secondary | ICD-10-CM | POA: Insufficient documentation

## 2019-07-14 DIAGNOSIS — Z5112 Encounter for antineoplastic immunotherapy: Secondary | ICD-10-CM | POA: Insufficient documentation

## 2019-07-14 LAB — CBC WITH DIFFERENTIAL/PLATELET
Abs Immature Granulocytes: 0.02 10*3/uL (ref 0.00–0.07)
Basophils Absolute: 0.1 10*3/uL (ref 0.0–0.1)
Basophils Relative: 1 %
Eosinophils Absolute: 0.8 10*3/uL — ABNORMAL HIGH (ref 0.0–0.5)
Eosinophils Relative: 12 %
HCT: 29.2 % — ABNORMAL LOW (ref 39.0–52.0)
Hemoglobin: 9.3 g/dL — ABNORMAL LOW (ref 13.0–17.0)
Immature Granulocytes: 0 %
Lymphocytes Relative: 37 %
Lymphs Abs: 2.3 10*3/uL (ref 0.7–4.0)
MCH: 32.3 pg (ref 26.0–34.0)
MCHC: 31.8 g/dL (ref 30.0–36.0)
MCV: 101.4 fL — ABNORMAL HIGH (ref 80.0–100.0)
Monocytes Absolute: 0.5 10*3/uL (ref 0.1–1.0)
Monocytes Relative: 9 %
Neutro Abs: 2.5 10*3/uL (ref 1.7–7.7)
Neutrophils Relative %: 41 %
Platelets: 210 10*3/uL (ref 150–400)
RBC: 2.88 MIL/uL — ABNORMAL LOW (ref 4.22–5.81)
RDW: 12.7 % (ref 11.5–15.5)
WBC: 6.2 10*3/uL (ref 4.0–10.5)
nRBC: 0 % (ref 0.0–0.2)

## 2019-07-14 LAB — COMPREHENSIVE METABOLIC PANEL
ALT: 16 U/L (ref 0–44)
AST: 22 U/L (ref 15–41)
Albumin: 3.8 g/dL (ref 3.5–5.0)
Alkaline Phosphatase: 77 U/L (ref 38–126)
Anion gap: 7 (ref 5–15)
BUN: 25 mg/dL — ABNORMAL HIGH (ref 8–23)
CO2: 23 mmol/L (ref 22–32)
Calcium: 8.9 mg/dL (ref 8.9–10.3)
Chloride: 108 mmol/L (ref 98–111)
Creatinine, Ser: 2.1 mg/dL — ABNORMAL HIGH (ref 0.61–1.24)
GFR calc Af Amer: 33 mL/min — ABNORMAL LOW (ref >60)
GFR calc non Af Amer: 28 mL/min — ABNORMAL LOW (ref >60)
Glucose, Bld: 99 mg/dL (ref 70–99)
Potassium: 4.5 mmol/L (ref 3.5–5.1)
Sodium: 138 mmol/L (ref 135–145)
Total Bilirubin: 0.5 mg/dL (ref 0.3–1.2)
Total Protein: 6.8 g/dL (ref 6.5–8.1)

## 2019-07-14 LAB — TSH: TSH: 0.372 u[IU]/mL (ref 0.350–4.500)

## 2019-07-14 MED ORDER — SODIUM CHLORIDE 0.9 % IV SOLN
240.0000 mg | Freq: Once | INTRAVENOUS | Status: AC
  Administered 2019-07-14: 240 mg via INTRAVENOUS
  Filled 2019-07-14: qty 24

## 2019-07-14 MED ORDER — SODIUM CHLORIDE 0.9 % IV SOLN
Freq: Once | INTRAVENOUS | Status: AC
  Filled 2019-07-14: qty 250

## 2019-07-14 NOTE — Progress Notes (Signed)
Per MD to continue with treatment with Creatinine 2.10 and HR 51. Treatment team and pt updated.   Edward Cummings [employer]

## 2019-07-14 NOTE — Assessment & Plan Note (Addendum)
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  Stage IV;  OCT 2020 CT- PR; stable; chronic thickening of bladder stable.  Currently on opdivo. STABLE.   #Proceed with Opdivo today.. Labs today reviewed;  acceptable for treatment today.  We will proceed with getting CT scans chest and pelvis.  # Iatrogenic hypothyroidism-on Synthroid 88 mcg; stable; TSH January 2021-0.3.   # Anemia sec to CKD/ on Iv iron.Hb-9.1; stable  # CKD stage III-IV-stable; creatinine 2.1; stable.  # DISPOSITION: add TSH today. # treatment today. # Follow-up in 2 weeks- -MD labs-cbc/cmp/opdivo IV; CT C/A/P prior--Dr.B

## 2019-07-14 NOTE — Progress Notes (Signed)
[place] CONSULT NOTE  Patient Care Team: [name], [name], MD as Consulting Physician (Hematology and Oncology)  CHIEF COMPLAINTS/PURPOSE OF CONSULTATION: Bladder cancer   Oncology History Overview Note  # AUG 2019-TRANSITIONAL CELL BLADDER CA [~ 4cm tumor] s/p cystoscopy [Dr.Stoiff]  with extensive angiolymphatic invasion; [name]; [name]; mediastinal uptake; right pubic rami uptake.  # [name]; Jan 18th 2021- switched to [facility]- [pt preference; [name]; [name]; [name]  ;GOALS: palliative  CURRENT/MOST RECENT THERAPY:OPDIVO [C]     Cancer of overlapping sites of bladder ([place])  02/28/2018 - 06/29/2019 Chemotherapy   The patient had atezolizumab (TECENTRIQ) 1,[name], 1,200 mg, Intravenous, Once, [name],200 mg (02/28/2018), 1,200 mg (03/21/2018), 1,200 mg (04/11/2018), 1,200 mg (05/02/2018), 1,200 mg (06/13/2018), 1,200 mg (05/23/2018), 1,200 mg (07/04/2018), 1,200 mg (07/25/2018), 1,200 mg (08/15/2018), 1,200 mg (09/05/2018), 1,200 mg (10/25/2018), 1,200 mg (11/22/2018), 1,200 mg (12/20/2018), 1,200 mg (01/10/2019), 1,200 mg (01/31/2019), 1,200 mg (02/21/2019), 1,200 mg (03/14/2019), 1,200 mg (04/04/2019), 1,200 mg (04/25/2019), 1,200 mg (05/16/2019), 1,200 mg (06/09/2019)  for chemotherapy treatment.    06/30/2019 -  Chemotherapy   The patient had nivolumab (OPDIVO) 240 mg in sodium chloride 0.9 % 100 mL chemo infusion, 240 mg, Intravenous, Once, 2 of 6  cycles Administration: 240 mg (06/30/2019), 240 mg (07/14/2019)  for chemotherapy treatment.       HISTORY OF PRESENTING ILLNESS: Edward Cummings 86 y.o.  male with metastatic transitional carcinoma of the bladder currently on Tecentriq is here for follow-up.  Denies any nausea vomiting diarrhea.  Chronic mild shortness of breath.  Chronic mild fatigue.  Denies any blood in urine.   Review of Systems  Constitutional: Positive for malaise/fatigue. Negative for chills, [name], hemoptysis, sputum production and wheezing.   Cardiovascular: Negative for chest pain, palpitations, orthopnea and leg swelling.  Gastrointestinal: Negative for abdominal pain, blood in stool, diarrhea, heartburn, melena, nausea and vomiting.  Genitourinary: Negative for dysuria, frequency and urgency.  Musculoskeletal: Positive for back pain. Negative for joint pain.  Skin: Negative.  Negative for itching and rash.  Neurological: Negative for tingling, focal weakness, weakness and headaches.  Endo/Heme/Allergies: Does not bruise/bleed easily.  Psychiatric/Behavioral: Negative for depression. The patient is not nervous/anxious and does not have insomnia.      MEDICAL HISTORY:  Past Medical History:  Diagnosis Date  . Anemia   . Cancer ([place])    bladder  . Chronic kidney disease   . Depression   . Hypertension   . Neuromuscular disorder ([place])    Nerve damage to left face/eye since around 2002.    SURGICAL HISTORY: Past Surgical History:  Procedure Laterality Date  . CYSTOSCOPY W/ RETROGRADES Bilateral 01/25/2018   Procedure: CYSTOSCOPY WITH RETROGRADE PYELOGRAM;  [name], MD;  [name];  Service: Urology;  Laterality: Bilateral;  . [place]  Bilateral 01/06/2019   Procedure:  CYSTOSCOPY WITH RETROGRADE PYELOGRAM/URETERAL STENT REMOVAL;  [name], MD;  [name];  Service: Urology;  Laterality: Bilateral;  . CYSTOSCOPY WITH STENT PLACEMENT Bilateral 01/25/2018   Procedure: CYSTOSCOPY WITH STENT PLACEMENT;  [name], MD;  [name];  Service: Urology;  Laterality: Bilateral;  . DORSAL SLIT N/A 01/06/2019   Procedure: DORSAL SLIT;  [name], MD;  [name];  Service: Urology;  [name];  . EYE SURGERY     Cornea transplants bilaterally & cataract surgery.  . TRANSURETHRAL RESECTION OF BLADDER TUMOR N/A 01/25/2018   Procedure: TRANSURETHRAL RESECTION OF BLADDER TUMOR (TURBT);  [name], MD;  [name];  Service: Urology;  [name];    SOCIAL HISTORY: lives in [place]; with wife; quit smoking 18 years ago; beer every 2 months or so.mechanic/retd.   Social History   Socioeconomic History  . Marital status: Married    Spouse name: [name]  . Number of children: Not on file  . Years of education: Not on file  . Highest education level: Not on file  Occupational History  . Not on file  Tobacco Use  . Smoking status: Former Research scientist (life sciences)  . Smokeless tobacco: Current User    Types: Chew  . Tobacco comment: Stopped approximately 10 years ago.  Substance and Sexual Activity  . Alcohol use: Yes    Alcohol/week: 2.0 standard drinks    Types: 2 Cans of beer per week    Comment: Daily  . Drug use: Never  . Sexual activity: Yes    Birth control/protection: None  Other Topics Concern  . Not on file  Social History Narrative  . Not on file   Social Determinants of Health   Financial Resource Strain:   . Difficulty of Paying Living Expenses: Not on file  Food Insecurity:   . Worried About Charity fundraiser in the Last Year: Not on file  . Ran Out of Food in the Last Year: Not on file  Transportation Needs:   . Lack of Transportation (Medical): Not  on file  . Lack of Transportation (Non-Medical): Not on file  Physical Activity:   . Days of Exercise per Week: Not on file  . Minutes of Exercise per Session: Not on file  Stress:   . Feeling of Stress : Not on file  Social Connections:   . Frequency of Communication with Friends and Family: Not on file  . Frequency of Social Gatherings with Friends and Family: Not on file  . Attends Religious Services: Not on file  . Active Member of Clubs or Organizations: Not on file  . Attends Archivist Meetings: Not on file  . Marital Status: Not on file  Intimate Partner Violence:   . Fear of Current or Ex-Partner: Not on file  . Emotionally Abused: Not on file  . Physically Abused: Not on file  . Sexually Abused: Not on file    FAMILY HISTORY: Family History  Problem Relation Age of Onset  . Prostate cancer Neg Hx   . Kidney cancer Neg Hx   . Bladder Cancer Neg Hx     ALLERGIES:  has No Known Allergies.  MEDICATIONS:  Current Outpatient Medications  Medication Sig Dispense Refill  . amLODipine (NORVASC) 5 MG tablet Take 1 tablet (5 mg total) by mouth daily. 30 tablet 3  . aspirin EC 81 MG tablet Take 81 mg by mouth  daily.     . docusate sodium (COLACE) 50 MG capsule Take 50 mg by mouth at bedtime.    . fexofenadine (ALLEGRA) 180 MG tablet Take 180 mg by mouth daily.    . fluticasone (FLONASE) 50 MCG/ACT nasal spray Place 2 sprays into both nostrils daily as needed for allergies or rhinitis.    Marland Kitchen HYDROcodone-acetaminophen (NORCO/VICODIN) 5-325 MG tablet Take 1 tablet by mouth every 8 (eight) hours as needed for moderate pain. 30 tablet 0  . levothyroxine (SYNTHROID) 88 MCG tablet Take 1 tablet (88 mcg total) by mouth daily before breakfast. Empty stomach- 1 hour prior to breakfast. 30 tablet 3  . Multiple Vitamin (MULTIVITAMIN WITH MINERALS) TABS tablet Take 1 tablet by mouth daily.     Marland Kitchen oxybutynin (DITROPAN) 5 MG tablet Take 1 tablet by mouth 3 (three) times daily as  needed.    . prednisoLONE acetate (PRED FORTE) 1 % ophthalmic suspension Place 1 drop into both eyes daily.    . tamsulosin (FLOMAX) 0.4 MG CAPS capsule Take 1 capsule (0.4 mg total) by mouth at bedtime. 90 capsule 1   No current facility-administered medications for this visit.      Marland Kitchen  PHYSICAL EXAMINATION: ECOG PERFORMANCE STATUS: 1 - Symptomatic but completely ambulatory  Vitals:   07/14/19 0840  BP: (!) 146/56  Pulse: (!) 48  Resp: 20  Temp: (!) 96.3 F (35.7 C)   Filed Weights   07/14/19 0840  Weight: 128 lb (58.1 kg)    Physical Exam  Constitutional: He is oriented to person, place, and time.  With his wife  Walking himself.  Thin built moderately nourished male patient.  HENT:  Head: Normocephalic and atraumatic.  Mouth/Throat: Oropharynx is clear and moist. No oropharyngeal exudate.  Eyes: Pupils are equal, round, and reactive to light.  Chronic drooping of the left eyelid.  Cardiovascular: Normal rate and regular rhythm.  Pulmonary/Chest: No respiratory distress. He has no wheezes.  Abdominal: Soft. Bowel sounds are normal. He exhibits no distension and no mass. There is no abdominal tenderness. There is no rebound and no guarding.  Musculoskeletal:        General: No tenderness or edema. Normal range of motion.     Cervical back: Normal range of motion and neck supple.  Neurological: He is alert and oriented to person, place, and time.  Skin: Skin is warm.  Psychiatric: Affect normal.     LABORATORY DATA:  I have reviewed the data as listed Lab Results  Component Value Date   WBC 6.2 07/14/2019   HGB 9.3 (L) 07/14/2019   HCT 29.2 (L) 07/14/2019   MCV 101.4 (H) 07/14/2019   PLT 210 07/14/2019   Recent Labs    06/09/19 0837 06/30/19 0809 07/14/19 0810  NA 141 140 138  K 3.9 3.6 4.5  CL 109 110 108  CO2 '23 23 23  ' GLUCOSE 100* 132* 99  BUN 33* 29* 25*  CREATININE 2.69* 2.19* 2.10*  CALCIUM 9.2 8.9 8.9  GFRNONAA 21* 27* 28*  GFRAA 24* 31* 33*   PROT 7.4 6.8 6.8  ALBUMIN 4.2 4.0 3.8  AST '25 21 22  ' ALT '14 12 16  ' ALKPHOS 81 73 77  BILITOT 0.7 0.6 0.5    RADIOGRAPHIC STUDIES: I have personally reviewed the radiological images as listed and agreed with the findings in the report. No results found.  ASSESSMENT & PLAN:   Cancer of overlapping sites of bladder ([place]) # High-grade transitional cell carcinoma of the bladder  metastatic to retroperitoneal lymph node.  Stage IV;  OCT 2020 CT- PR; stable; chronic thickening of bladder stable.  Currently on opdivo. STABLE.   #Proceed with Opdivo today.. Labs today reviewed;  acceptable for treatment today.  We will proceed with getting CT scans chest and pelvis.  # Iatrogenic hypothyroidism-on Synthroid 88 mcg; stable; TSH January 2021-0.3.   # Anemia sec to CKD/ on Iv iron.Hb-9.1; stable  # CKD stage III-IV-stable; creatinine 2.1; stable.  # DISPOSITION: add TSH today. # treatment today. # Follow-up in 2 weeks- -MD labs-cbc/cmp/opdivo IV; CT C/A/P prior--Dr.B   All questions were answered. The patient knows to call the clinic with any problems, questions or concerns.    Cammie Sickle, MD 07/15/2019 8:04 AM

## 2019-07-25 ENCOUNTER — Ambulatory Visit
Admission: RE | Admit: 2019-07-25 | Discharge: 2019-07-25 | Disposition: A | Discharge status: Home / Self Care | Payer: Medicare HMO | Source: Ambulatory Visit | Attending: Internal Medicine | Admitting: Internal Medicine

## 2019-07-25 ENCOUNTER — Encounter: Payer: Self-pay | Admitting: Internal Medicine

## 2019-07-25 ENCOUNTER — Other Ambulatory Visit: Payer: Self-pay

## 2019-07-25 DIAGNOSIS — C679 Malignant neoplasm of bladder, unspecified: Secondary | ICD-10-CM | POA: Diagnosis not present

## 2019-07-25 DIAGNOSIS — C678 Malignant neoplasm of overlapping sites of bladder: Secondary | ICD-10-CM | POA: Insufficient documentation

## 2019-07-25 IMAGING — CT CT CHEST W/O CM
2 of 4 series · 12 of 36 positions shown, 15 images · non-contrast
Comparison: [DATE].

CLINICAL DATA: Bladder cancer on immunotherapy.

EXAM:
CT CHEST, ABDOMEN AND PELVIS WITHOUT CONTRAST
TECHNIQUE: Multidetector CT imaging of the chest, abdomen and pelvis was
performed following the standard protocol without IV contrast.

[Series 2: axials cap 5.00 · axial · 0.66mm/px · z∈[-1545,-1005]mm · 9 of 130 slices shown, 12 images]
[im 11/130  mediastinal]
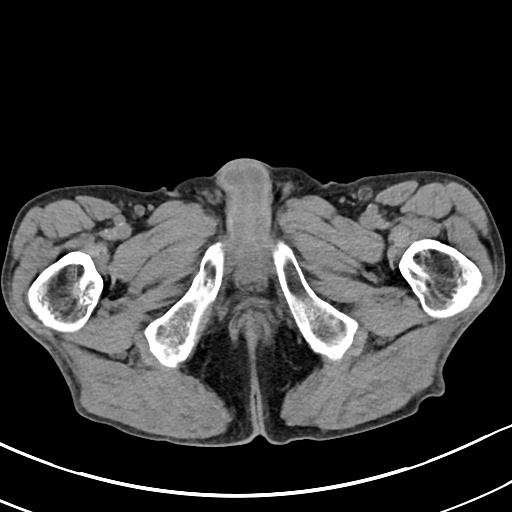
[im 11/130  lung]
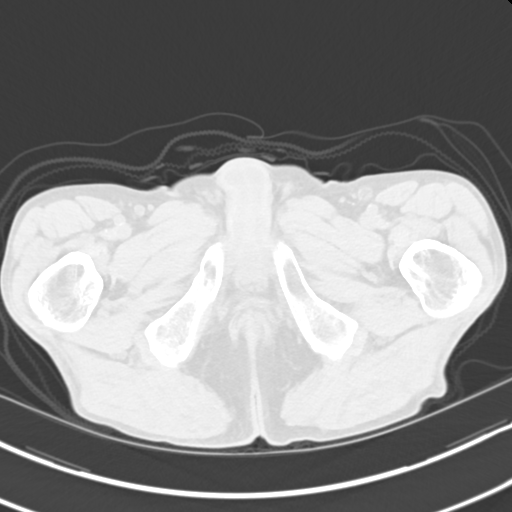
[im 22/130  lung]
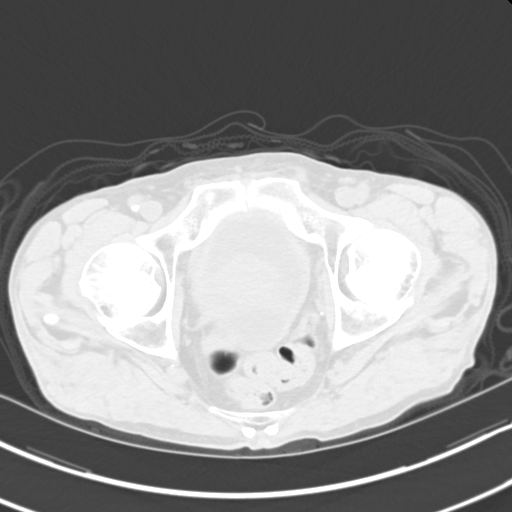
[im 44/130  lung]
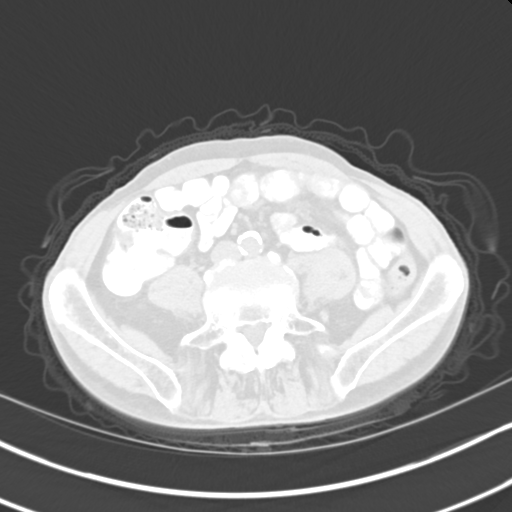
[im 54/130  lung]
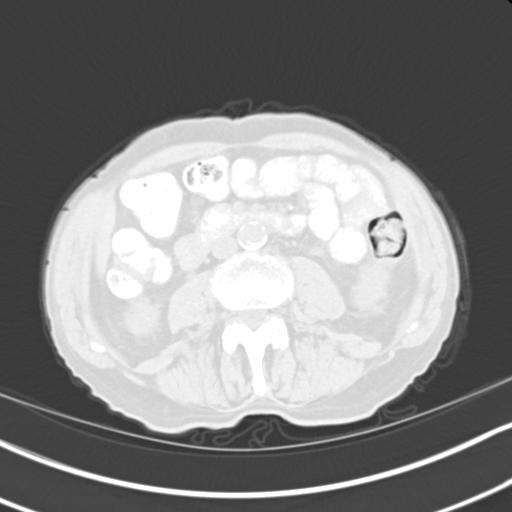
[im 65/130  mediastinal]
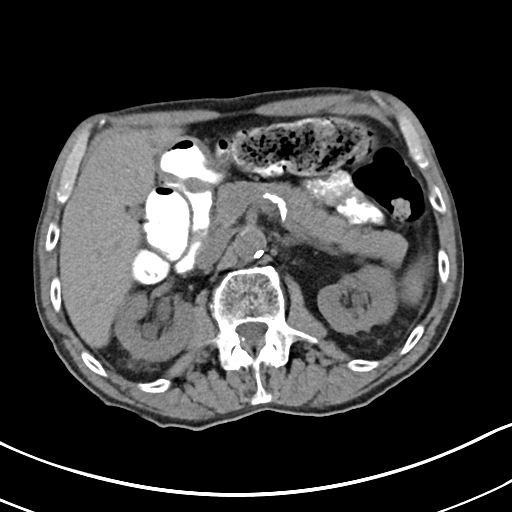
[im 65/130  lung]
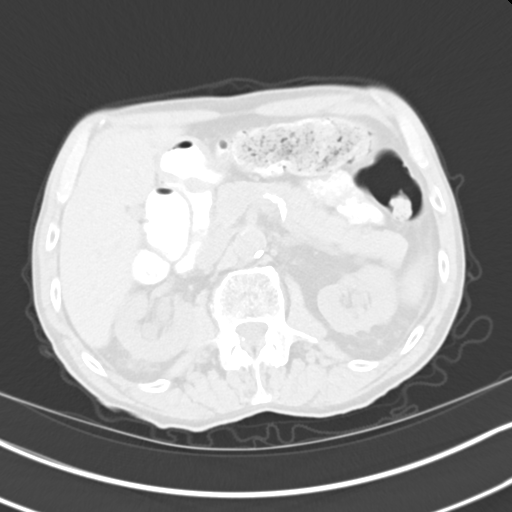
[im 76/130  lung]
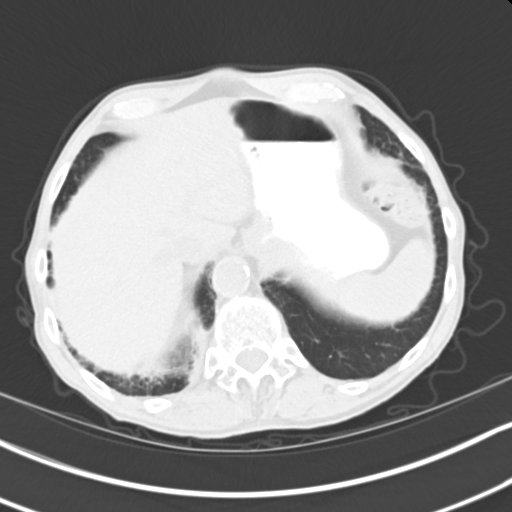
[im 87/130  lung]
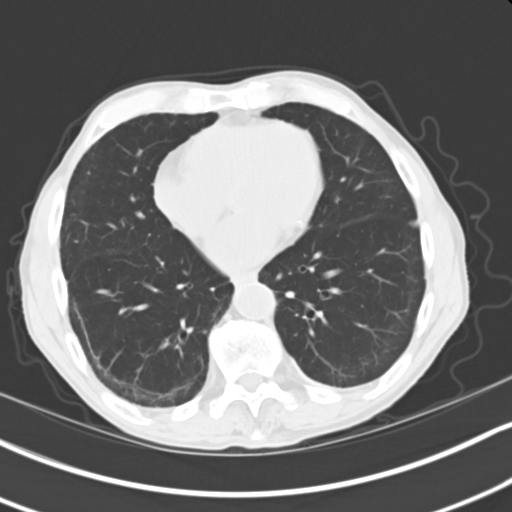
[im 108/130  lung]
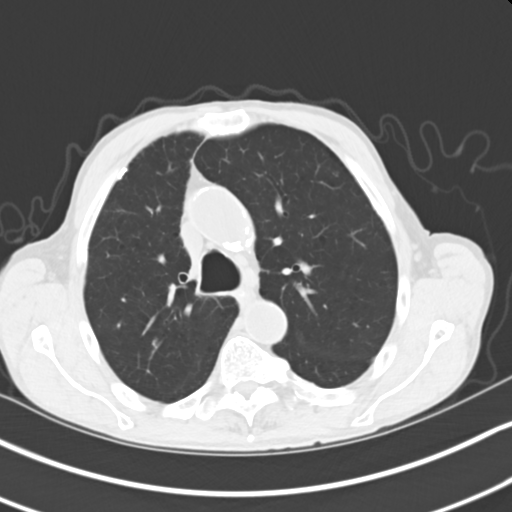
[im 119/130  mediastinal]
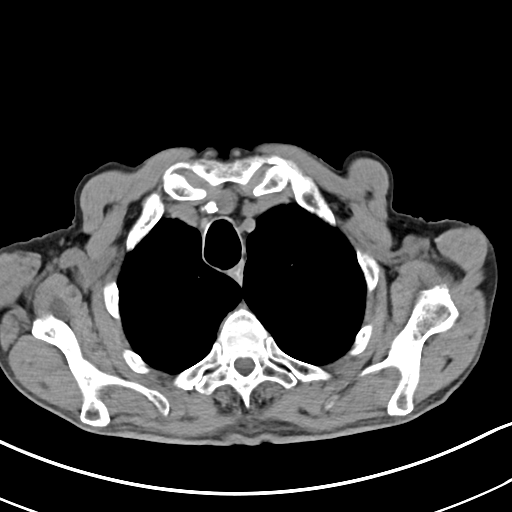
[im 119/130  lung]
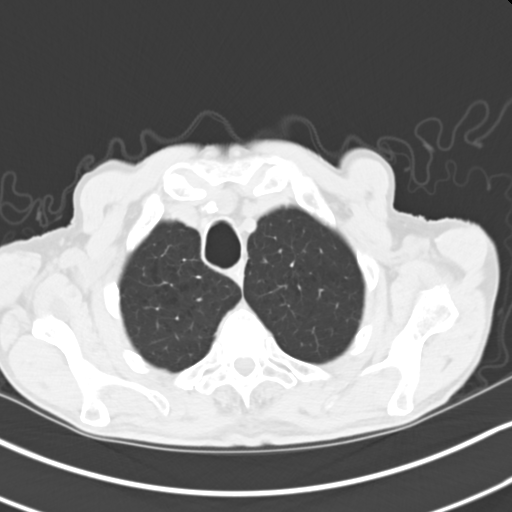

[Series 4: coronals cap 2.00 cor · coronal · 0.66mm/px · 3 of 130 slices shown]
[im 26/130  lung]
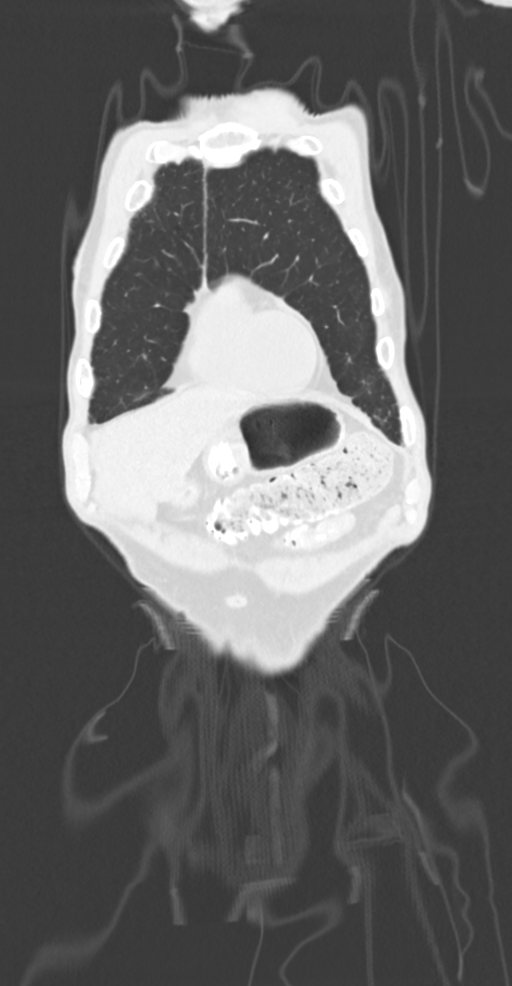
[im 52/130  lung]
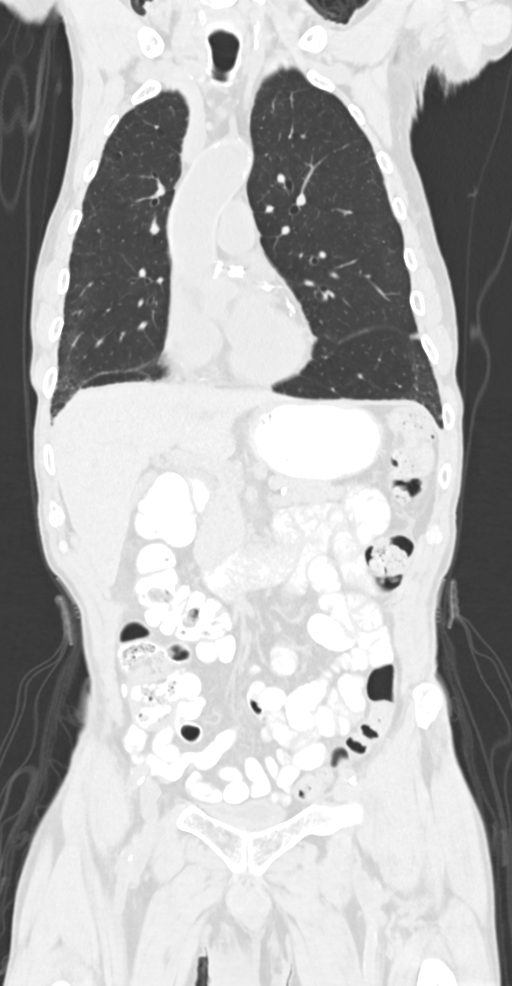
[im 78/130  lung]
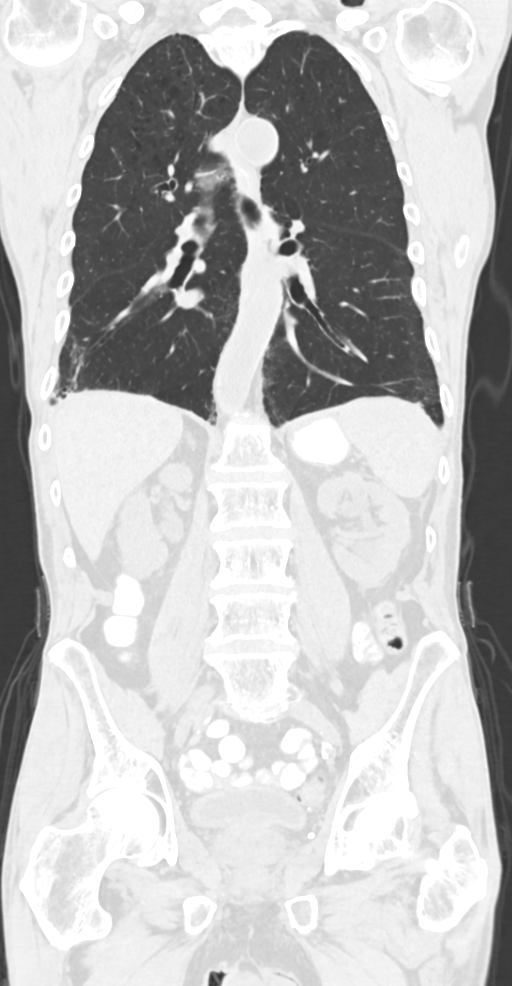

[12 of 36 positions shown; findings below may reference images not displayed]

FINDINGS: CT CHEST FINDINGS

Cardiovascular: Atherosclerotic calcification of the aorta, aortic
valve and coronary arteries. Heart size normal. No pericardial
effusion.

Mediastinum/Nodes: Mediastinal lymph nodes are not enlarged by CT
size criteria. Hilar regions are difficult to definitively evaluate
without IV contrast but appear grossly unremarkable. No axillary
adenopathy. Esophagus is grossly unremarkable.

Lungs/Pleura: Centrilobular and paraseptal emphysema. Calcified
pleural plaques bilaterally. Basilar predominant subpleural
reticulation, ground-glass and traction bronchiolectasis, similar. 3
mm peripheral right upper lobe nodule (3/61), stable. 3 mm
peripheral left lower lobe nodule (3/129), stable. Calcified
granulomas. No pleural fluid. Airway is unremarkable.

Musculoskeletal: No worrisome lytic or sclerotic lesions.

CT ABDOMEN PELVIS FINDINGS

Hepatobiliary: Liver is unremarkable. Suspect noncalcified stones in
a contracted gallbladder. No biliary ductal dilatation.

Pancreas: Negative.

Spleen: Negative.

Adrenals/Urinary Tract: Adrenal glands are unremarkable.
Low-attenuation lesions in the kidneys measure up to 2.0 cm on the
right and are likely cysts. No urinary stones. Ureters are
decompressed. Bladder may be slightly thick-walled.

Stomach/Bowel: Wall thickening at the gastroesophageal junction, as
before. Stomach, small bowel, appendix and colon are otherwise
unremarkable.

Vascular/Lymphatic: Atherosclerotic calcification of the aorta
without aneurysm. No pathologically enlarged lymph nodes.

Reproductive: Prostate is enlarged and indents the bladder.

Other: No free fluid.  Mesenteries and peritoneum are unremarkable.

Musculoskeletal: Degenerative changes in the spine. No worrisome
lytic or sclerotic lesions.
IMPRESSION: 1. No evidence of metastatic disease.
2. Enlarged prostate. Slight bladder trabeculation indicates an
element of outlet obstruction.
3. Basilar pulmonary fibrosis with calcified pleural plaques,
findings indicative of asbestosis.
4. Wall thickening at the gastroesophageal junction, as before.
Please correlate clinically.
5. Cholelithiasis.
6. Aortic atherosclerosis ([Z1]-[Z1]). Coronary artery
calcification.
7.  Emphysema ([Z1]-[Z1]).

## 2019-07-25 IMAGING — CT CT ABD-PELV W/O CM
2 of 4 series · 13 of 46 positions shown, 15 images · non-contrast
Comparison: [DATE].

CLINICAL DATA: Bladder cancer on immunotherapy.

EXAM:
CT CHEST, ABDOMEN AND PELVIS WITHOUT CONTRAST
TECHNIQUE: Multidetector CT imaging of the chest, abdomen and pelvis was
performed following the standard protocol without IV contrast.

[Series 2: axials cap 5.00 · axial · 0.66mm/px · z∈[-1550,-1000]mm · 10 of 130 slices shown, 12 images]
[im 10/130  soft-tissue]
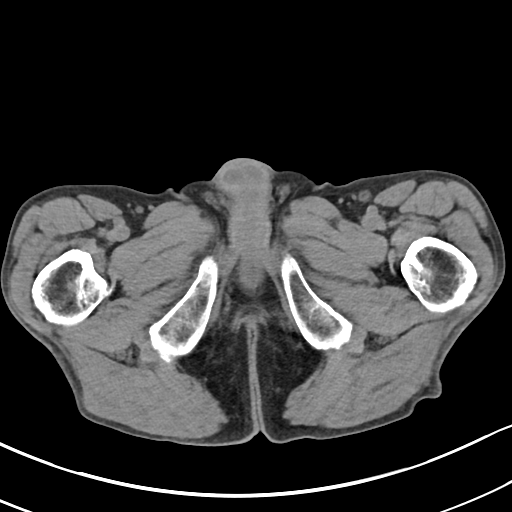
[im 10/130  bone]
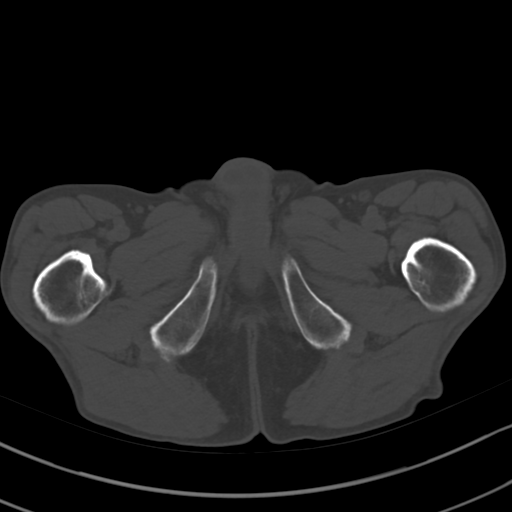
[im 19/130  soft-tissue]
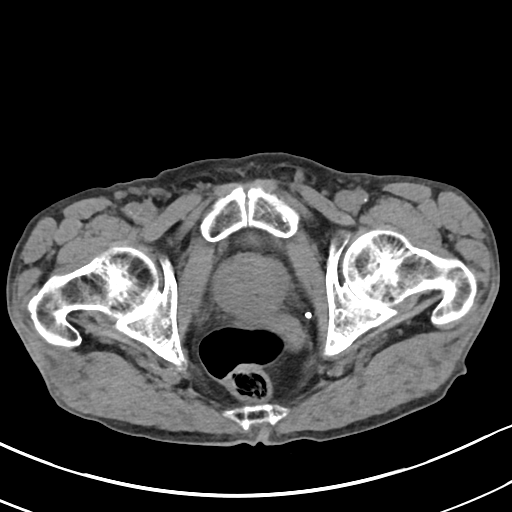
[im 37/130  soft-tissue]
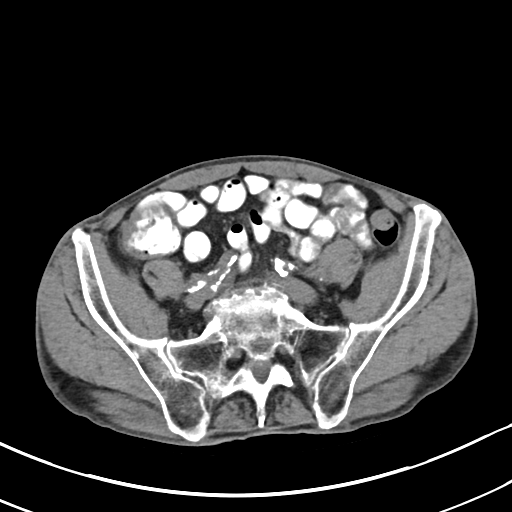
[im 47/130  soft-tissue]
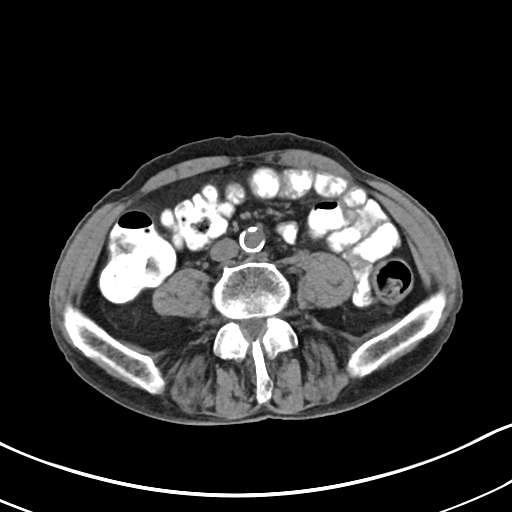
[im 56/130  soft-tissue]
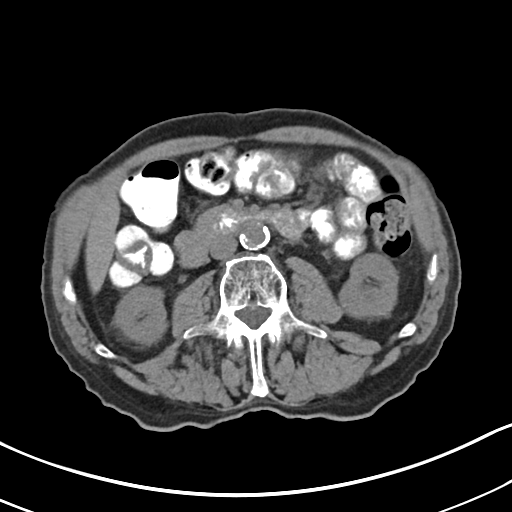
[im 74/130  soft-tissue]
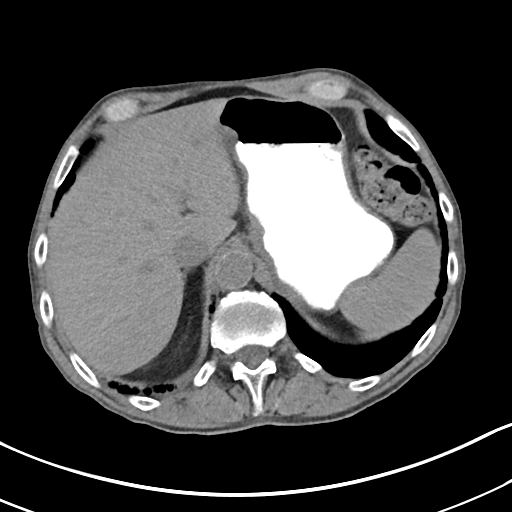
[im 83/130  soft-tissue]
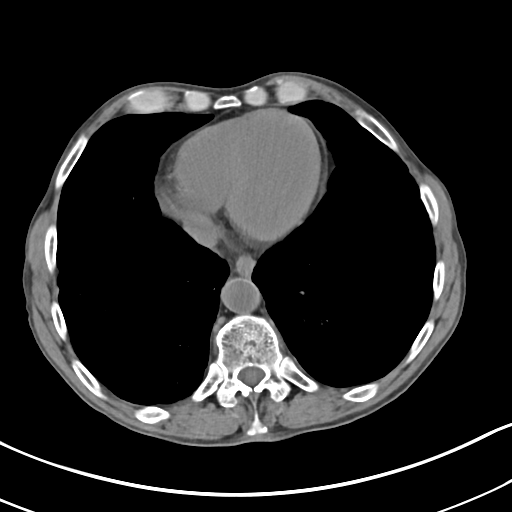
[im 93/130  soft-tissue]
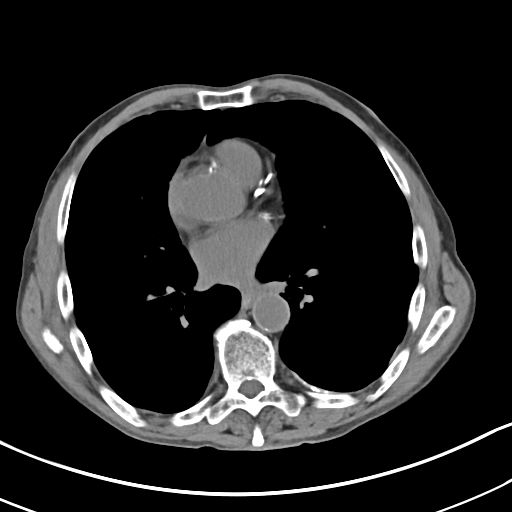
[im 111/130  soft-tissue]
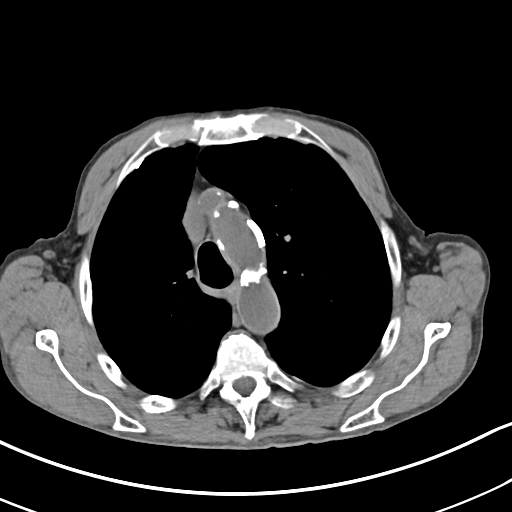
[im 111/130  bone]
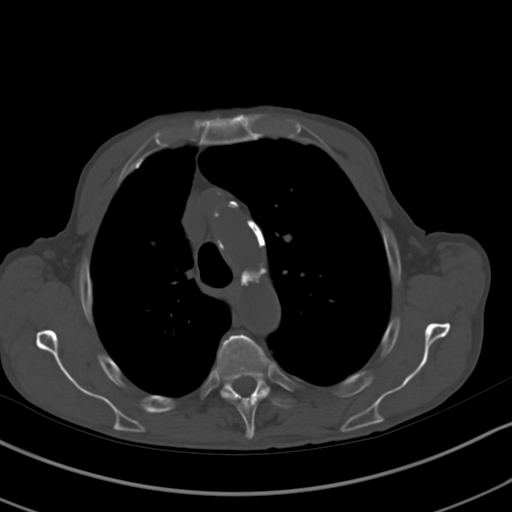
[im 120/130  soft-tissue]
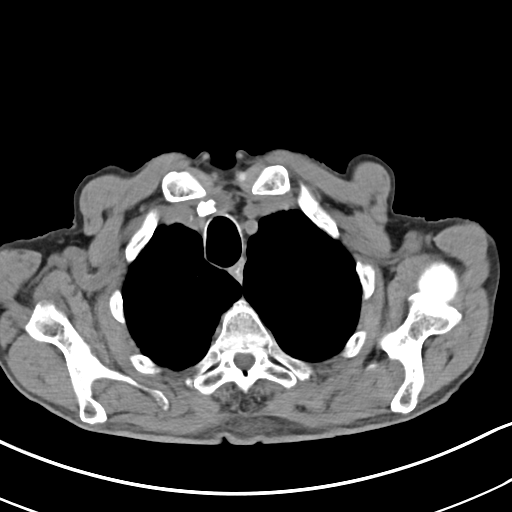

[Series 4: coronals cap 2.00 cor · coronal · 0.66mm/px · 3 of 130 slices shown]
[im 44/130  soft-tissue]
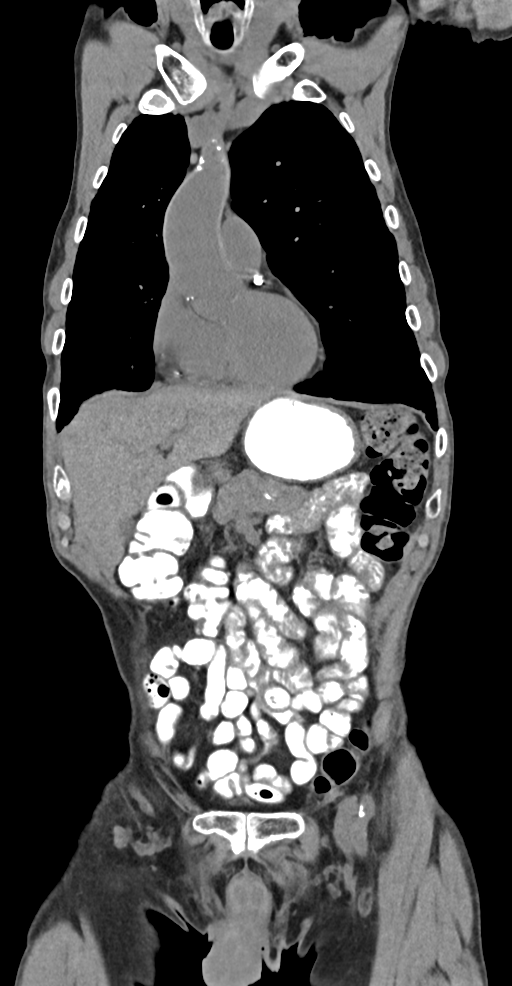
[im 58/130  soft-tissue]
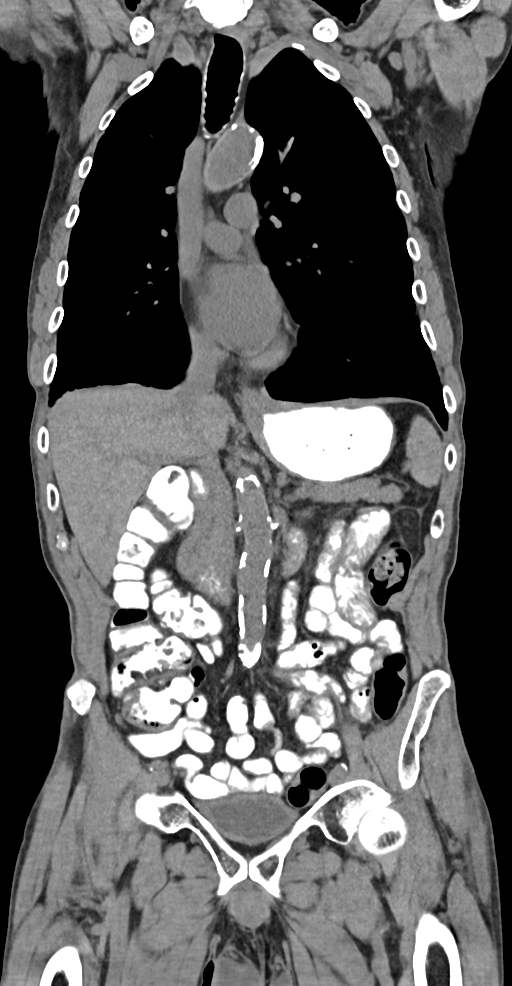
[im 72/130  soft-tissue]
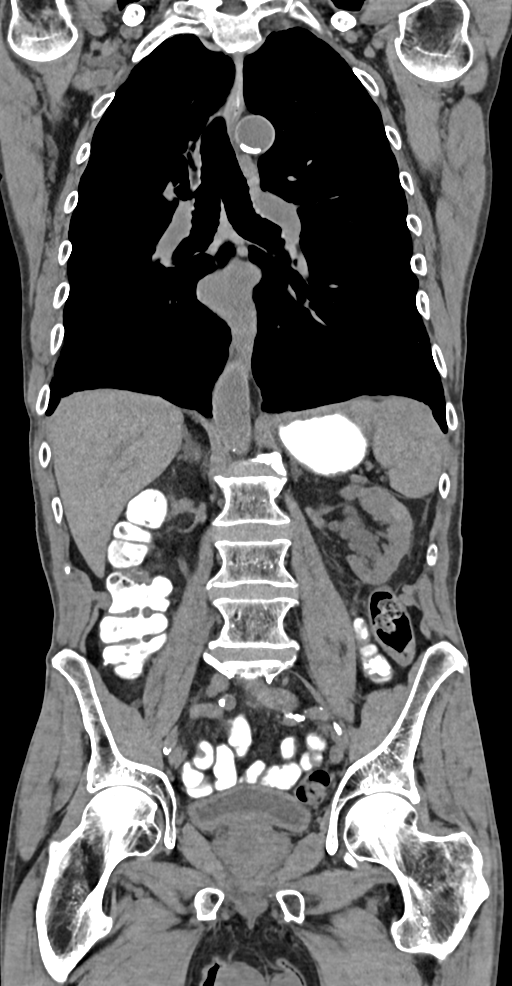

[13 of 46 positions shown; findings below may reference images not displayed]

FINDINGS: CT CHEST FINDINGS

Cardiovascular: Atherosclerotic calcification of the aorta, aortic
valve and coronary arteries. Heart size normal. No pericardial
effusion.

Mediastinum/Nodes: Mediastinal lymph nodes are not enlarged by CT
size criteria. Hilar regions are difficult to definitively evaluate
without IV contrast but appear grossly unremarkable. No axillary
adenopathy. Esophagus is grossly unremarkable.

Lungs/Pleura: Centrilobular and paraseptal emphysema. Calcified
pleural plaques bilaterally. Basilar predominant subpleural
reticulation, ground-glass and traction bronchiolectasis, similar. 3
mm peripheral right upper lobe nodule (3/61), stable. 3 mm
peripheral left lower lobe nodule (3/129), stable. Calcified
granulomas. No pleural fluid. Airway is unremarkable.

Musculoskeletal: No worrisome lytic or sclerotic lesions.

CT ABDOMEN PELVIS FINDINGS

Hepatobiliary: Liver is unremarkable. Suspect noncalcified stones in
a contracted gallbladder. No biliary ductal dilatation.

Pancreas: Negative.

Spleen: Negative.

Adrenals/Urinary Tract: Adrenal glands are unremarkable.
Low-attenuation lesions in the kidneys measure up to 2.0 cm on the
right and are likely cysts. No urinary stones. Ureters are
decompressed. Bladder may be slightly thick-walled.

Stomach/Bowel: Wall thickening at the gastroesophageal junction, as
before. Stomach, small bowel, appendix and colon are otherwise
unremarkable.

Vascular/Lymphatic: Atherosclerotic calcification of the aorta
without aneurysm. No pathologically enlarged lymph nodes.

Reproductive: Prostate is enlarged and indents the bladder.

Other: No free fluid.  Mesenteries and peritoneum are unremarkable.

Musculoskeletal: Degenerative changes in the spine. No worrisome
lytic or sclerotic lesions.
IMPRESSION: 1. No evidence of metastatic disease.
2. Enlarged prostate. Slight bladder trabeculation indicates an
element of outlet obstruction.
3. Basilar pulmonary fibrosis with calcified pleural plaques,
findings indicative of asbestosis.
4. Wall thickening at the gastroesophageal junction, as before.
Please correlate clinically.
5. Cholelithiasis.
6. Aortic atherosclerosis ([Z1]-[Z1]). Coronary artery
calcification.
7.  Emphysema ([Z1]-[Z1]).

## 2019-07-28 ENCOUNTER — Inpatient Hospital Stay: Payer: Medicare HMO

## 2019-07-28 ENCOUNTER — Inpatient Hospital Stay (HOSPITAL_BASED_OUTPATIENT_CLINIC_OR_DEPARTMENT_OTHER): Payer: Medicare HMO | Admitting: Internal Medicine

## 2019-07-28 VITALS — BP 128/57 | HR 59 | Temp 97.0°F | Resp 20 | Ht 68.0 in | Wt 127.4 lb

## 2019-07-28 DIAGNOSIS — N184 Chronic kidney disease, stage 4 (severe): Secondary | ICD-10-CM | POA: Diagnosis not present

## 2019-07-28 DIAGNOSIS — C772 Secondary and unspecified malignant neoplasm of intra-abdominal lymph nodes: Secondary | ICD-10-CM | POA: Diagnosis not present

## 2019-07-28 DIAGNOSIS — R1319 Other dysphagia: Secondary | ICD-10-CM

## 2019-07-28 DIAGNOSIS — R1310 Dysphagia, unspecified: Secondary | ICD-10-CM | POA: Diagnosis not present

## 2019-07-28 DIAGNOSIS — C678 Malignant neoplasm of overlapping sites of bladder: Secondary | ICD-10-CM | POA: Diagnosis not present

## 2019-07-28 DIAGNOSIS — E039 Hypothyroidism, unspecified: Secondary | ICD-10-CM | POA: Diagnosis not present

## 2019-07-28 DIAGNOSIS — E032 Hypothyroidism due to medicaments and other exogenous substances: Secondary | ICD-10-CM

## 2019-07-28 DIAGNOSIS — I10 Essential (primary) hypertension: Secondary | ICD-10-CM | POA: Diagnosis not present

## 2019-07-28 DIAGNOSIS — Z7189 Other specified counseling: Secondary | ICD-10-CM

## 2019-07-28 DIAGNOSIS — Z9221 Personal history of antineoplastic chemotherapy: Secondary | ICD-10-CM | POA: Diagnosis not present

## 2019-07-28 DIAGNOSIS — N400 Enlarged prostate without lower urinary tract symptoms: Secondary | ICD-10-CM

## 2019-07-28 DIAGNOSIS — Z87891 Personal history of nicotine dependence: Secondary | ICD-10-CM | POA: Diagnosis not present

## 2019-07-28 DIAGNOSIS — Z5112 Encounter for antineoplastic immunotherapy: Secondary | ICD-10-CM | POA: Diagnosis not present

## 2019-07-28 DIAGNOSIS — D631 Anemia in chronic kidney disease: Secondary | ICD-10-CM | POA: Diagnosis not present

## 2019-07-28 LAB — COMPREHENSIVE METABOLIC PANEL
ALT: 16 U/L (ref 0–44)
AST: 22 U/L (ref 15–41)
Albumin: 4.1 g/dL (ref 3.5–5.0)
Alkaline Phosphatase: 76 U/L (ref 38–126)
Anion gap: 7 (ref 5–15)
BUN: 29 mg/dL — ABNORMAL HIGH (ref 8–23)
CO2: 24 mmol/L (ref 22–32)
Calcium: 9.1 mg/dL (ref 8.9–10.3)
Chloride: 109 mmol/L (ref 98–111)
Creatinine, Ser: 2.5 mg/dL — ABNORMAL HIGH (ref 0.61–1.24)
GFR calc Af Amer: 26 mL/min — ABNORMAL LOW (ref >60)
GFR calc non Af Amer: 23 mL/min — ABNORMAL LOW (ref >60)
Glucose, Bld: 120 mg/dL — ABNORMAL HIGH (ref 70–99)
Potassium: 4 mmol/L (ref 3.5–5.1)
Sodium: 140 mmol/L (ref 135–145)
Total Bilirubin: 0.7 mg/dL (ref 0.3–1.2)
Total Protein: 7.3 g/dL (ref 6.5–8.1)

## 2019-07-28 LAB — CBC WITH DIFFERENTIAL/PLATELET
Abs Immature Granulocytes: 0.02 10*3/uL (ref 0.00–0.07)
Basophils Absolute: 0.1 10*3/uL (ref 0.0–0.1)
Basophils Relative: 1 %
Eosinophils Absolute: 1 10*3/uL — ABNORMAL HIGH (ref 0.0–0.5)
Eosinophils Relative: 13 %
HCT: 30.5 % — ABNORMAL LOW (ref 39.0–52.0)
Hemoglobin: 9.9 g/dL — ABNORMAL LOW (ref 13.0–17.0)
Immature Granulocytes: 0 %
Lymphocytes Relative: 34 %
Lymphs Abs: 2.7 10*3/uL (ref 0.7–4.0)
MCH: 32.5 pg (ref 26.0–34.0)
MCHC: 32.5 g/dL (ref 30.0–36.0)
MCV: 100 fL (ref 80.0–100.0)
Monocytes Absolute: 0.5 10*3/uL (ref 0.1–1.0)
Monocytes Relative: 6 %
Neutro Abs: 3.7 10*3/uL (ref 1.7–7.7)
Neutrophils Relative %: 46 %
Platelets: 227 10*3/uL (ref 150–400)
RBC: 3.05 MIL/uL — ABNORMAL LOW (ref 4.22–5.81)
RDW: 12.8 % (ref 11.5–15.5)
WBC: 8.1 10*3/uL (ref 4.0–10.5)
nRBC: 0 % (ref 0.0–0.2)

## 2019-07-28 MED ORDER — LEVOTHYROXINE SODIUM 88 MCG PO TABS: 88.0000 ug | ORAL_TABLET | Freq: Every day | ORAL | 3 refills | Status: DC

## 2019-07-28 MED ORDER — SODIUM CHLORIDE 0.9 % IV SOLN
240.0000 mg | Freq: Once | INTRAVENOUS | Status: AC
  Administered 2019-07-28: 11:00:00 240 mg via INTRAVENOUS
  Filled 2019-07-28: qty 24

## 2019-07-28 MED ORDER — SODIUM CHLORIDE 0.9 % IV SOLN
Freq: Once | INTRAVENOUS | Status: AC
  Filled 2019-07-28: qty 250

## 2019-07-28 NOTE — Progress Notes (Signed)
[facility] CONSULT NOTE  Patient Care Team: [name], [name], MD as Consulting Physician (Hematology and Oncology)  CHIEF COMPLAINTS/PURPOSE OF CONSULTATION: Bladder cancer   Oncology History Overview Note  # AUG 2019-TRANSITIONAL CELL BLADDER CA [~ 4cm tumor] s/p cystoscopy [Dr.Stoiff]  with extensive angiolymphatic invasion; [name]; [name]; mediastinal uptake; right pubic rami uptake.  # [name]; Jan 18th 2021- switched to [place]- [pt preference; [name]; [name]; [name]  ;GOALS: palliative  CURRENT/MOST RECENT THERAPY:OPDIVO [C]     Cancer of overlapping sites of bladder ([place])  02/28/2018 - 06/29/2019 Chemotherapy   The patient had atezolizumab (TECENTRIQ) 1,[name], 1,200 mg, Intravenous, Once, [name],200 mg (02/28/2018), 1,200 mg (03/21/2018), 1,200 mg (04/11/2018), 1,200 mg (05/02/2018), 1,200 mg (06/13/2018), 1,200 mg (05/23/2018), 1,200 mg (07/04/2018), 1,200 mg (07/25/2018), 1,200 mg (08/15/2018), 1,200 mg (09/05/2018), 1,200 mg (10/25/2018), 1,200 mg (11/22/2018), 1,200 mg (12/20/2018), 1,200 mg (01/10/2019), 1,200 mg (01/31/2019), 1,200 mg (02/21/2019), 1,200 mg (03/14/2019), 1,200 mg (04/04/2019), 1,200 mg (04/25/2019), 1,200 mg (05/16/2019), 1,200 mg (06/09/2019)  for chemotherapy treatment.    06/30/2019 -  Chemotherapy   The patient had nivolumab (OPDIVO) 240 mg in sodium chloride 0.9 % 100 mL chemo infusion, 240 mg, Intravenous, Once, 3 of 6  cycles Administration: 240 mg (06/30/2019), 240 mg (07/14/2019)  for chemotherapy treatment.       HISTORY OF PRESENTING ILLNESS: Edward Cummings 86 y.o.  male with metastatic transitional carcinoma of the bladder currently on Opdivo is here for follow-up/review results of the CT scan.  Patient appetite is good.  Denies any worsening shortness of breath or cough.  On further questioning admits to intermittent difficulty swallowing especially solids.  Able to swallow liquids with current difficulty.  No regurgitation.  Mild fatigue.  No blood in urine.  Review of Systems  Constitutional: Positive for malaise/fatigue. Negative for chills, [name], hemoptysis, sputum production and wheezing.   Cardiovascular: Negative for chest pain, palpitations, orthopnea and leg swelling.  Gastrointestinal: Negative for abdominal pain, blood in stool, diarrhea, heartburn, melena, nausea and vomiting.  Genitourinary: Negative for dysuria, frequency and urgency.  Musculoskeletal: Positive for back pain. Negative for joint pain.  Skin: Negative.  Negative for itching and rash.  Neurological: Negative for tingling, focal weakness, weakness and headaches.  Endo/Heme/Allergies: Does not bruise/bleed easily.  Psychiatric/Behavioral: Negative for depression. The patient is not nervous/anxious and does not have insomnia.      MEDICAL HISTORY:  Past Medical History:  Diagnosis Date  . Anemia   . Cancer ([place])    bladder  . Chronic kidney disease   . Depression   . Hypertension   . Neuromuscular disorder ([place])    Nerve damage to left face/eye since around 2002.    SURGICAL HISTORY: Past Surgical History:  Procedure Laterality Date  . CYSTOSCOPY W/ RETROGRADES Bilateral 01/25/2018   Procedure: CYSTOSCOPY  WITH RETROGRADE PYELOGRAM;   [name], MD;  [name];  Service: Urology;  Laterality: Bilateral;  . CYSTOSCOPY W/ URETERAL STENT PLACEMENT Bilateral 01/06/2019   Procedure: CYSTOSCOPY WITH RETROGRADE PYELOGRAM/URETERAL STENT REMOVAL;  [name], MD;  [name];  Service: Urology;  Laterality: Bilateral;  . CYSTOSCOPY WITH STENT PLACEMENT Bilateral 01/25/2018   Procedure: CYSTOSCOPY WITH STENT PLACEMENT;  [name], MD;  [name];  Service: Urology;  Laterality: Bilateral;  . DORSAL SLIT N/A 01/06/2019   Procedure: DORSAL SLIT;  [name], MD;  [name];  Service: Urology;  [name];  . EYE SURGERY     Cornea transplants bilaterally & cataract surgery.  . TRANSURETHRAL RESECTION OF BLADDER TUMOR N/A 01/25/2018   Procedure: TRANSURETHRAL RESECTION OF BLADDER TUMOR (TURBT);  [name], MD;  [name];  Service: Urology;  [name];    SOCIAL HISTORY: lives in [place]; with wife; quit smoking 18 years ago; beer every 2 months or so.mechanic/retd.   Social History   Socioeconomic History  . Marital status: Married    Spouse name: [name]  . Number of children: Not on file  . Years of education: Not on file  . Highest education level: Not on file  Occupational History  . Not on file  Tobacco Use  . Smoking status: Former Research scientist (life sciences)  . Smokeless tobacco: Current User    Types: Chew  . Tobacco comment: Stopped approximately 10 years ago.  Substance and Sexual Activity  . Alcohol use: Yes    Alcohol/week: 2.0 standard drinks    Types: 2 Cans of beer per week    Comment: Daily  . Drug use: Never  . Sexual activity: Yes    Birth control/protection: None  Other Topics Concern  . Not on file  Social History Narrative  . Not on file   Social Determinants of Health   Financial Resource Strain:   . Difficulty of Paying Living Expenses: Not on file  Food Insecurity:   . Worried  About Charity fundraiser in the Last Year: Not on file  . Ran Out of Food in the Last Year: Not on file  Transportation Needs:   . Lack of Transportation (Medical): Not on file  . Lack of Transportation (Non-Medical): Not on file  Physical Activity:   . Days of Exercise per Week: Not on file  . Minutes of Exercise per Session: Not on file  Stress:   . Feeling of Stress : Not on file  Social Connections:   . Frequency of Communication with Friends and Family: Not on file  . Frequency of Social Gatherings with Friends and Family: Not on file  . Attends Religious Services: Not on file  . Active Member of Clubs or Organizations: Not on file  . Attends Archivist Meetings: Not on file  . Marital Status: Not on file  Intimate Partner Violence:   . Fear of Current or Ex-Partner: Not on file  . Emotionally Abused: Not on file  . Physically Abused: Not on file  . Sexually Abused: Not on file    FAMILY HISTORY: Family History  Problem Relation Age of Onset  . Prostate cancer Neg Hx   . Kidney cancer Neg Hx   . Bladder Cancer Neg Hx     ALLERGIES:  has No Known Allergies.  MEDICATIONS:  Current Outpatient Medications  Medication Sig Dispense Refill  . amLODipine (NORVASC) 5  MG tablet Take 1 tablet (5 mg total) by mouth daily. 30 tablet 3  . aspirin EC 81 MG tablet Take 81 mg by mouth daily.     Marland Kitchen docusate sodium (COLACE) 50 MG capsule Take 50 mg by mouth at bedtime.    . fexofenadine (ALLEGRA) 180 MG tablet Take 180 mg by mouth daily.    . fluticasone (FLONASE) 50 MCG/ACT nasal spray Place 2 sprays into both nostrils daily as needed for allergies or rhinitis.    Marland Kitchen HYDROcodone-acetaminophen (NORCO/VICODIN) 5-325 MG tablet Take 1 tablet by mouth every 8 (eight) hours as needed for moderate pain. 30 tablet 0  . levothyroxine (SYNTHROID) 88 MCG tablet Take 1 tablet (88 mcg total) by mouth daily before breakfast. Empty stomach- 1 hour prior to breakfast. 90 tablet 3  . Multiple  Vitamin (MULTIVITAMIN WITH MINERALS) TABS tablet Take 1 tablet by mouth daily.     Marland Kitchen oxybutynin (DITROPAN) 5 MG tablet Take 1 tablet by mouth 3 (three) times daily as needed.    . prednisoLONE acetate (PRED FORTE) 1 % ophthalmic suspension Place 1 drop into both eyes daily.    . tamsulosin (FLOMAX) 0.4 MG CAPS capsule Take 1 capsule (0.4 mg total) by mouth at bedtime. 90 capsule 1   No current facility-administered medications for this visit.   Facility-Administered Medications Ordered in Other Visits  Medication Dose Route Frequency Provider Last Rate Last Admin  . nivolumab (OPDIVO) 240 mg in sodium chloride 0.9 % 100 mL chemo infusion  240 mg Intravenous Once Charlaine Dalton R, MD          .  PHYSICAL EXAMINATION: ECOG PERFORMANCE STATUS: 1 - Symptomatic but completely ambulatory  Vitals:   07/28/19 0906  BP: (!) 128/57  Pulse: (!) 59  Resp: 20  Temp: (!) 97 F (36.1 C)   Filed Weights   07/28/19 0906  Weight: 127 lb 6.4 oz (57.8 kg)    Physical Exam  Constitutional: He is oriented to person, place, and time.  With his wife  Walking himself.  Thin built moderately nourished male patient.  HENT:  Head: Normocephalic and atraumatic.  Mouth/Throat: Oropharynx is clear and moist. No oropharyngeal exudate.  Eyes: Pupils are equal, round, and reactive to light.  Chronic drooping of the left eyelid.  Cardiovascular: Normal rate and regular rhythm.  Pulmonary/Chest: No respiratory distress. He has no wheezes.  Abdominal: Soft. Bowel sounds are normal. He exhibits no distension and no mass. There is no abdominal tenderness. There is no rebound and no guarding.  Musculoskeletal:        General: No tenderness or edema. Normal range of motion.     Cervical back: Normal range of motion and neck supple.  Neurological: He is alert and oriented to person, place, and time.  Skin: Skin is warm.  Psychiatric: Affect normal.     LABORATORY DATA:  I have reviewed the data as  listed Lab Results  Component Value Date   WBC 8.1 07/28/2019   HGB 9.9 (L) 07/28/2019   HCT 30.5 (L) 07/28/2019   MCV 100.0 07/28/2019   PLT 227 07/28/2019   Recent Labs    06/30/19 0809 07/14/19 0810 07/28/19 0844  NA 140 138 140  K 3.6 4.5 4.0  CL 110 108 109  CO2 '23 23 24  ' GLUCOSE 132* 99 120*  BUN 29* 25* 29*  CREATININE 2.19* 2.10* 2.50*  CALCIUM 8.9 8.9 9.1  GFRNONAA 27* 28* 23*  GFRAA 31* 33* 26*  PROT 6.8  6.8 7.3  ALBUMIN 4.0 3.8 4.1  AST '21 22 22  ' ALT '12 16 16  ' ALKPHOS 73 77 76  BILITOT 0.6 0.5 0.7    RADIOGRAPHIC STUDIES: I have personally reviewed the radiological images as listed and agreed with the findings in the report. CT ABDOMEN PELVIS WO CONTRAST  Result Date: 07/25/2019 CLINICAL DATA:  Bladder cancer on immunotherapy. EXAM: CT CHEST, ABDOMEN AND PELVIS WITHOUT CONTRAST TECHNIQUE: Multidetector CT imaging of the chest, abdomen and pelvis was performed following the standard protocol without IV contrast. COMPARISON:  03/31/2019. FINDINGS: CT CHEST FINDINGS Cardiovascular: Atherosclerotic calcification of the aorta, aortic valve and coronary arteries. Heart size normal. No pericardial effusion. Mediastinum/Nodes: Mediastinal lymph nodes are not enlarged by CT size criteria. Hilar regions are difficult to definitively evaluate without IV contrast but appear grossly unremarkable. No axillary adenopathy. Esophagus is grossly unremarkable. Lungs/Pleura: Centrilobular and paraseptal emphysema. Calcified pleural plaques bilaterally. Basilar predominant subpleural reticulation, ground-glass and traction bronchiolectasis, similar. 3 mm peripheral right upper lobe nodule (3/61), stable. 3 mm peripheral left lower lobe nodule (3/129), stable. Calcified granulomas. No pleural fluid. Airway is unremarkable. Musculoskeletal: No worrisome lytic or sclerotic lesions. CT ABDOMEN PELVIS FINDINGS Hepatobiliary: Liver is unremarkable. Suspect noncalcified stones in a contracted  gallbladder. No biliary ductal dilatation. Pancreas: Negative. Spleen: Negative. Adrenals/Urinary Tract: Adrenal glands are unremarkable. Low-attenuation lesions in the kidneys measure up to 2.0 cm on the right and are likely cysts. No urinary stones. Ureters are decompressed. Bladder may be slightly thick-walled. Stomach/Bowel: Wall thickening at the gastroesophageal junction, as before. Stomach, small bowel, appendix and colon are otherwise unremarkable. Vascular/Lymphatic: Atherosclerotic calcification of the aorta without aneurysm. No pathologically enlarged lymph nodes. Reproductive: Prostate is enlarged and indents the bladder. Other: No free fluid.  Mesenteries and peritoneum are unremarkable. Musculoskeletal: Degenerative changes in the spine. No worrisome lytic or sclerotic lesions. IMPRESSION: 1. No evidence of metastatic disease. 2. Enlarged prostate. Slight bladder trabeculation indicates an element of outlet obstruction. 3. Basilar pulmonary fibrosis with calcified pleural plaques, findings indicative of asbestosis. 4. Wall thickening at the gastroesophageal junction, as before. Please correlate clinically. 5. Cholelithiasis. 6. Aortic atherosclerosis (ICD10-I70.0). Coronary artery calcification. 7.  Emphysema (ICD10-J43.9). Electronically Signed   By: Lorin Picket M.D.   On: 07/25/2019 11:46   CT CHEST WO CONTRAST  Result Date: 07/25/2019 CLINICAL DATA:  Bladder cancer on immunotherapy. EXAM: CT CHEST, ABDOMEN AND PELVIS WITHOUT CONTRAST TECHNIQUE: Multidetector CT imaging of the chest, abdomen and pelvis was performed following the standard protocol without IV contrast. COMPARISON:  03/31/2019. FINDINGS: CT CHEST FINDINGS Cardiovascular: Atherosclerotic calcification of the aorta, aortic valve and coronary arteries. Heart size normal. No pericardial effusion. Mediastinum/Nodes: Mediastinal lymph nodes are not enlarged by CT size criteria. Hilar regions are difficult to definitively evaluate  without IV contrast but appear grossly unremarkable. No axillary adenopathy. Esophagus is grossly unremarkable. Lungs/Pleura: Centrilobular and paraseptal emphysema. Calcified pleural plaques bilaterally. Basilar predominant subpleural reticulation, ground-glass and traction bronchiolectasis, similar. 3 mm peripheral right upper lobe nodule (3/61), stable. 3 mm peripheral left lower lobe nodule (3/129), stable. Calcified granulomas. No pleural fluid. Airway is unremarkable. Musculoskeletal: No worrisome lytic or sclerotic lesions. CT ABDOMEN PELVIS FINDINGS Hepatobiliary: Liver is unremarkable. Suspect noncalcified stones in a contracted gallbladder. No biliary ductal dilatation. Pancreas: Negative. Spleen: Negative. Adrenals/Urinary Tract: Adrenal glands are unremarkable. Low-attenuation lesions in the kidneys measure up to 2.0 cm on the right and are likely cysts. No urinary stones. Ureters are decompressed. Bladder may be slightly thick-walled. Stomach/Bowel: Wall thickening  at the gastroesophageal junction, as before. Stomach, small bowel, appendix and colon are otherwise unremarkable. Vascular/Lymphatic: Atherosclerotic calcification of the aorta without aneurysm. No pathologically enlarged lymph nodes. Reproductive: Prostate is enlarged and indents the bladder. Other: No free fluid.  Mesenteries and peritoneum are unremarkable. Musculoskeletal: Degenerative changes in the spine. No worrisome lytic or sclerotic lesions. IMPRESSION: 1. No evidence of metastatic disease. 2. Enlarged prostate. Slight bladder trabeculation indicates an element of outlet obstruction. 3. Basilar pulmonary fibrosis with calcified pleural plaques, findings indicative of asbestosis. 4. Wall thickening at the gastroesophageal junction, as before. Please correlate clinically. 5. Cholelithiasis. 6. Aortic atherosclerosis (ICD10-I70.0). Coronary artery calcification. 7.  Emphysema (ICD10-J43.9). Electronically Signed   By: Lorin Picket  M.D.   On: 07/25/2019 11:46    ASSESSMENT & PLAN:   Cancer of overlapping sites of bladder ([place]) # High-grade transitional cell carcinoma of the bladder metastatic to retroperitoneal lymph node.  Stage IV; FEB 12th, 2021-  CT-chest and pelvis-noncontrast- negative for recurrence; prostatomegaly /thickening at the GE junction  [see below]. chronic thickening of bladder stable.  Currently on opdivo.  Stable  #Proceed with Opdivo today. Labs today reviewed;  acceptable for treatment today.    # Iatrogenic hypothyroidism-on Synthroid 88 mcg; stable; TSH January 2021-0.3.   # Enlarged prostate-on CT asymptomatic [Dr.Stoiff];  check PSA.   # Anemia sec to CKD/ on Iv iron.Hb-9.1; STABLE.   # Dysphagia- intermittent- ? Solids-dg esophagogram.    # CKD stage III-IV-stable; creatinine 2.5; stable.  # DISPOSITION:  # treatment today. # Follow-up in 2 weeks- -MD labs-cbc/cmp; PSA; [name] IV;esophagogram prior -Dr.B   All questions were answered. The patient knows to call the clinic with any problems, questions or concerns.    Cammie Sickle, MD 07/28/2019 10:06 AM

## 2019-07-28 NOTE — Assessment & Plan Note (Addendum)
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  Stage IV; FEB 12th, 2021-  CT-chest and pelvis-noncontrast- negative for recurrence; prostatomegaly /thickening at the GE junction  [see below]. chronic thickening of bladder stable.  Currently on opdivo.  Stable  #Proceed with Opdivo today. Labs today reviewed;  acceptable for treatment today.    # Iatrogenic hypothyroidism-on Synthroid 88 mcg; stable; TSH January 2021-0.3.   # Enlarged prostate-on CT asymptomatic [Dr.Stoiff];  check PSA.   # Anemia sec to CKD/ on Iv iron.Hb-9.1; STABLE.   # Dysphagia- intermittent- ? Solids-dg esophagogram.    # CKD stage III-IV-stable; creatinine 2.5; stable.  # DISPOSITION:  # treatment today. # Follow-up in 2 weeks- -MD labs-cbc/cmp; PSA; [name] IV;esophagogram prior -Dr.B

## 2019-08-07 ENCOUNTER — Other Ambulatory Visit: Payer: Self-pay

## 2019-08-07 ENCOUNTER — Ambulatory Visit
Admission: RE | Admit: 2019-08-07 | Discharge: 2019-08-07 | Disposition: A | Discharge status: Home / Self Care | Payer: Medicare HMO | Source: Ambulatory Visit | Attending: Internal Medicine | Admitting: Internal Medicine

## 2019-08-07 DIAGNOSIS — R1310 Dysphagia, unspecified: Secondary | ICD-10-CM | POA: Diagnosis not present

## 2019-08-07 DIAGNOSIS — R1319 Other dysphagia: Secondary | ICD-10-CM

## 2019-08-07 DIAGNOSIS — K449 Diaphragmatic hernia without obstruction or gangrene: Secondary | ICD-10-CM | POA: Diagnosis not present

## 2019-08-07 DIAGNOSIS — C678 Malignant neoplasm of overlapping sites of bladder: Secondary | ICD-10-CM | POA: Diagnosis not present

## 2019-08-07 IMAGING — RF DG ESOPHAGUS
7 series · 13 of 13 positions shown · non-contrast
Comparison: CT chest [DATE].

CLINICAL DATA: Dysphagia.

EXAM:
ESOPHOGRAM/BARIUM SWALLOW
TECHNIQUE: Combined double contrast and single contrast examination performed
using effervescent crystals, thick barium liquid, and thin barium
liquid.
FLUOROSCOPY TIME:  Fluoroscopy Time:  0 minutes 30 seconds
Radiation Exposure Index (if provided by the fluoroscopic device):
11 mGy

[Series 1: fluoro_barium 2fps_bw · 0.17mm/px · 4 of 11 frames shown (1 of 7)]
[frame 2/11]
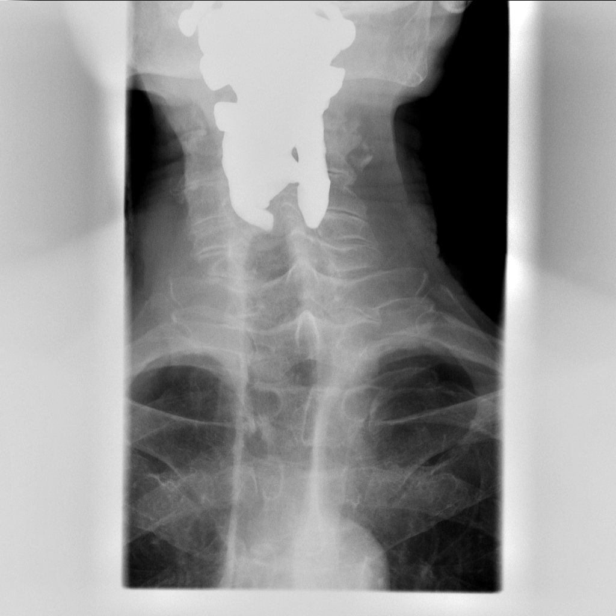
[frame 3/11]
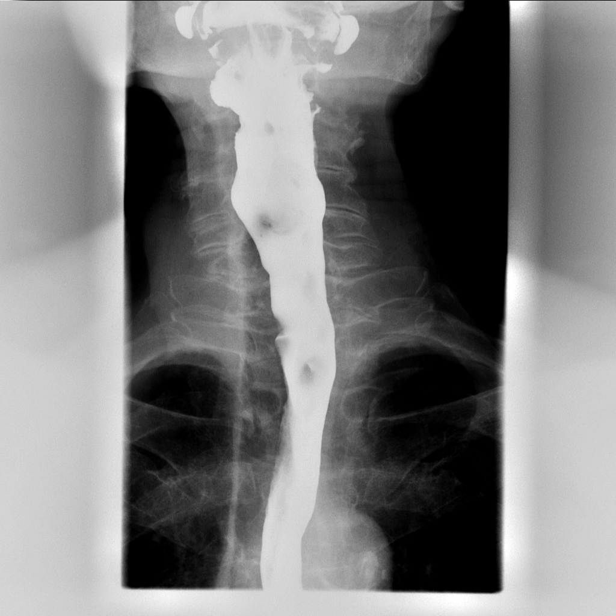
[frame 6/11]
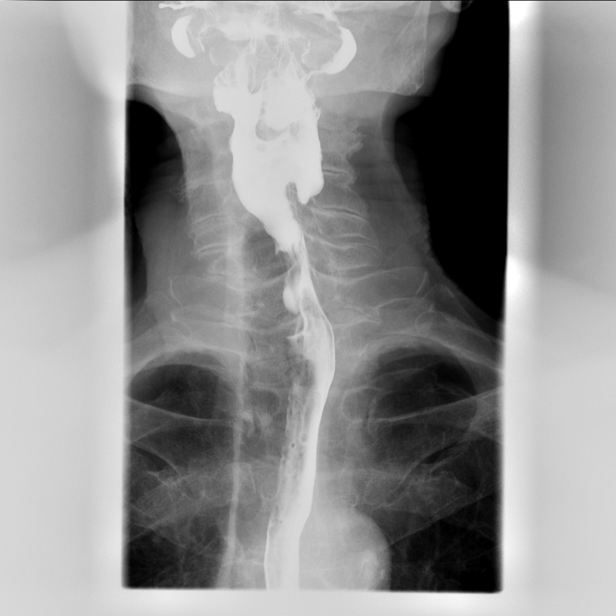
[frame 10/11]
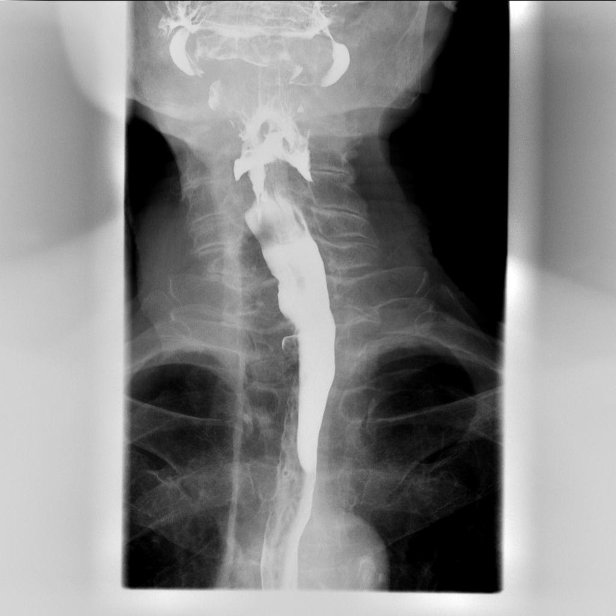

[Series 2: fluoro_barium 2fps_bw · 0.17mm/px · 1 of 1 slices shown (2 of 7)]
[im 1/1]
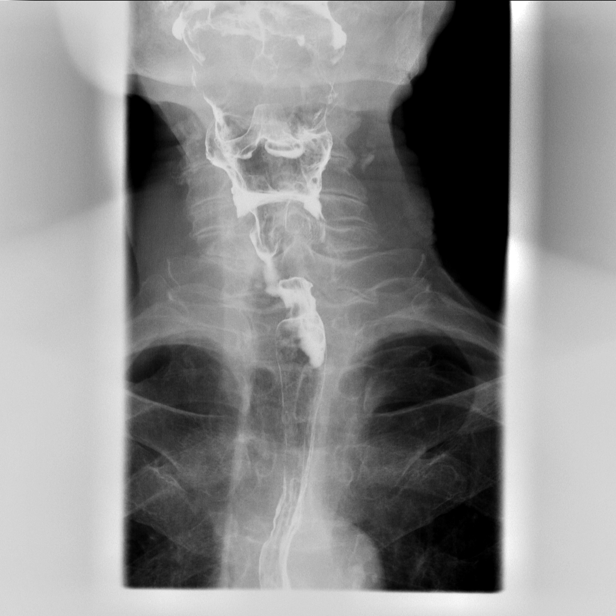

[Series 3: fluoro_barium 2fps_bw · 0.17mm/px · 3 of 4 frames shown (3 of 7)]
[frame 1/4]
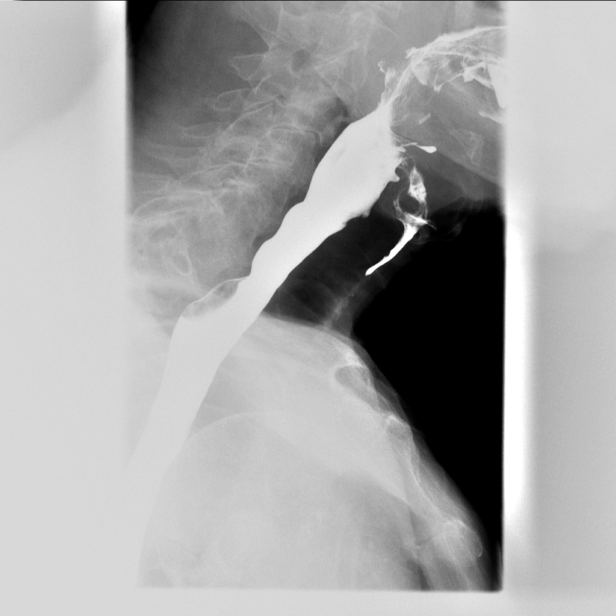
[frame 3/4]
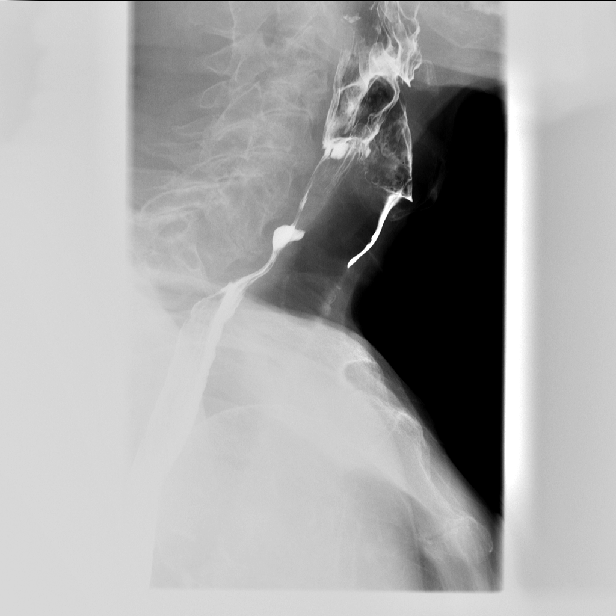
[frame 4/4]
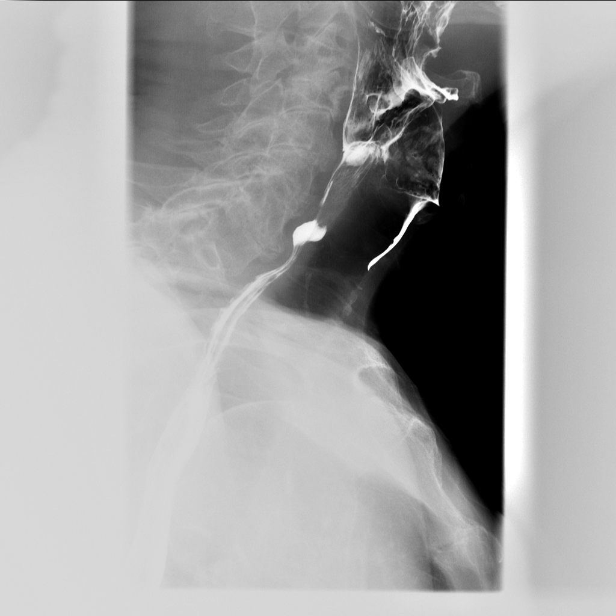

[Series 4: fluoro_barium 2fps_bw · 0.17mm/px · 1 of 1 slices shown (4 of 7)]
[im 1/1]
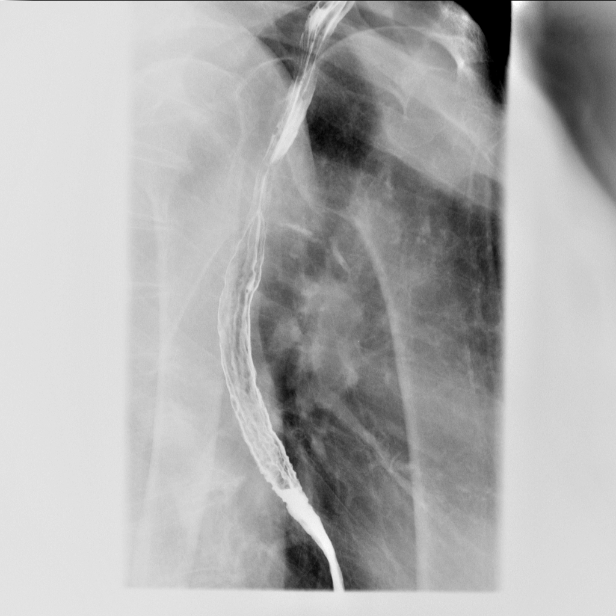

[Series 5: fluoro_barium 2fps_bw · 0.17mm/px · 1 of 1 slices shown (5 of 7)]
[im 1/1]
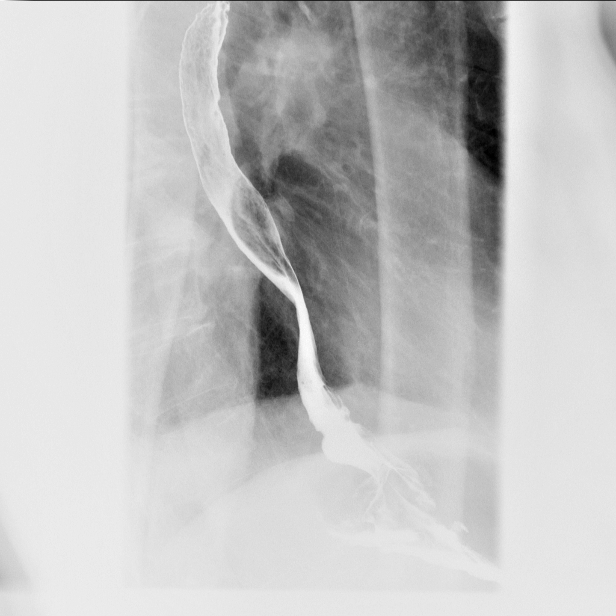

[Series 6: fluoro_barium 2fps_bw · 0.17mm/px · 1 of 1 slices shown (6 of 7)]
[im 1/1]
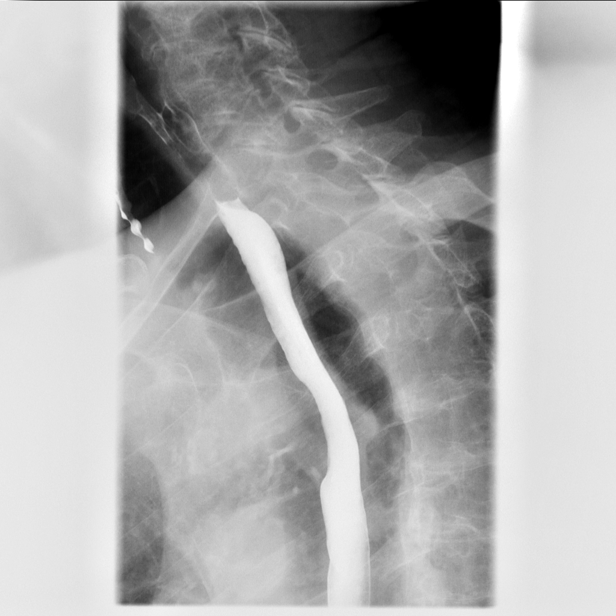

[Series 7: fluoro_barium 2fps_bw · 0.17mm/px · 2 of 2 frames shown (7 of 7)]
[frame 1/2]
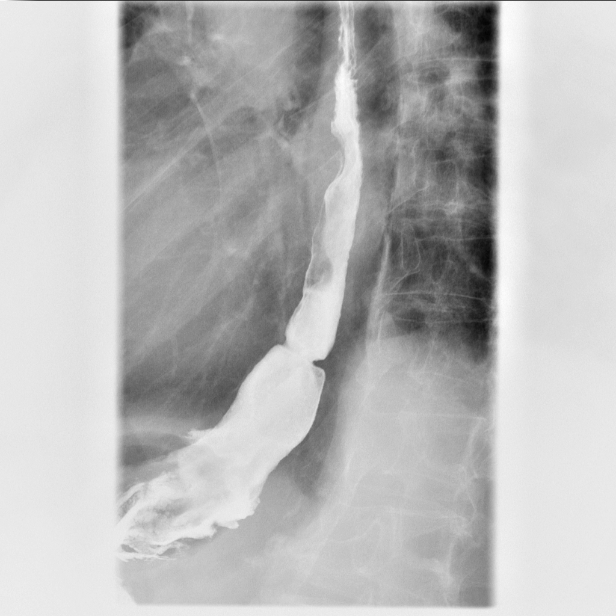
[frame 2/2]
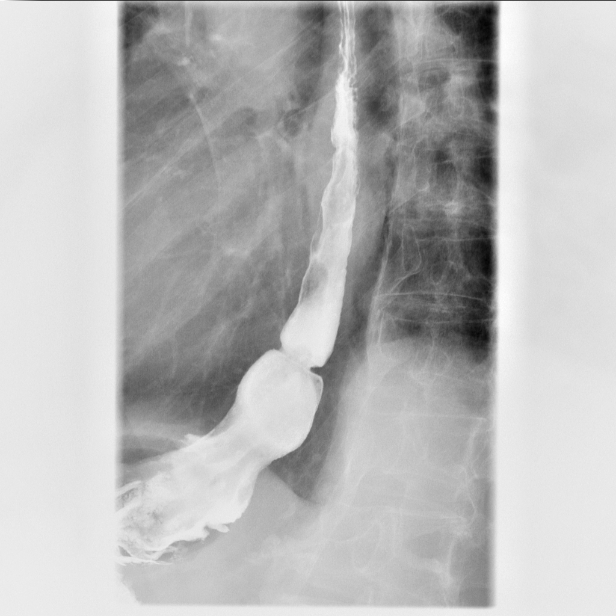

[13 of 13 positions shown; findings below may reference images not displayed]

FINDINGS: Cervical esophagus is widely patent. Barium aspiration noted. Small
sliding hiatal hernia with slightly prominent B ring noted. No
significant focal esophageal thickening noted. No evidence of
esophageal mass. No reflux. Barium tablet was not administered due
to the patient's aspiration.
IMPRESSION: 1.  Barium aspiration.

2. Small sliding hiatal hernia with slightly prominent B ring noted.
No significant focal esophageal thickening. No evidence of
esophageal mass. No reflux.

## 2019-08-11 ENCOUNTER — Inpatient Hospital Stay (HOSPITAL_BASED_OUTPATIENT_CLINIC_OR_DEPARTMENT_OTHER): Payer: Medicare HMO | Admitting: Internal Medicine

## 2019-08-11 ENCOUNTER — Inpatient Hospital Stay: Payer: Medicare HMO | Attending: Internal Medicine

## 2019-08-11 ENCOUNTER — Other Ambulatory Visit: Payer: Self-pay

## 2019-08-11 ENCOUNTER — Ambulatory Visit: Payer: Medicare HMO | Attending: Internal Medicine | Admitting: Speech Pathology

## 2019-08-11 ENCOUNTER — Encounter: Payer: Self-pay | Admitting: Internal Medicine

## 2019-08-11 ENCOUNTER — Inpatient Hospital Stay: Payer: Medicare HMO

## 2019-08-11 VITALS — BP 117/60 | HR 55 | Temp 96.6°F | Wt 124.0 lb

## 2019-08-11 DIAGNOSIS — D631 Anemia in chronic kidney disease: Secondary | ICD-10-CM | POA: Insufficient documentation

## 2019-08-11 DIAGNOSIS — R1310 Dysphagia, unspecified: Secondary | ICD-10-CM | POA: Insufficient documentation

## 2019-08-11 DIAGNOSIS — Z7982 Long term (current) use of aspirin: Secondary | ICD-10-CM | POA: Diagnosis not present

## 2019-08-11 DIAGNOSIS — Z7189 Other specified counseling: Secondary | ICD-10-CM

## 2019-08-11 DIAGNOSIS — N400 Enlarged prostate without lower urinary tract symptoms: Secondary | ICD-10-CM | POA: Insufficient documentation

## 2019-08-11 DIAGNOSIS — C678 Malignant neoplasm of overlapping sites of bladder: Secondary | ICD-10-CM

## 2019-08-11 DIAGNOSIS — Z79899 Other long term (current) drug therapy: Secondary | ICD-10-CM | POA: Diagnosis not present

## 2019-08-11 DIAGNOSIS — Z87891 Personal history of nicotine dependence: Secondary | ICD-10-CM | POA: Diagnosis not present

## 2019-08-11 DIAGNOSIS — E039 Hypothyroidism, unspecified: Secondary | ICD-10-CM | POA: Diagnosis not present

## 2019-08-11 DIAGNOSIS — Z9221 Personal history of antineoplastic chemotherapy: Secondary | ICD-10-CM | POA: Insufficient documentation

## 2019-08-11 DIAGNOSIS — I10 Essential (primary) hypertension: Secondary | ICD-10-CM | POA: Diagnosis not present

## 2019-08-11 DIAGNOSIS — N184 Chronic kidney disease, stage 4 (severe): Secondary | ICD-10-CM | POA: Diagnosis not present

## 2019-08-11 DIAGNOSIS — Z5112 Encounter for antineoplastic immunotherapy: Secondary | ICD-10-CM | POA: Diagnosis present

## 2019-08-11 DIAGNOSIS — C772 Secondary and unspecified malignant neoplasm of intra-abdominal lymph nodes: Secondary | ICD-10-CM | POA: Insufficient documentation

## 2019-08-11 LAB — CBC WITH DIFFERENTIAL/PLATELET
Abs Immature Granulocytes: 0.03 10*3/uL (ref 0.00–0.07)
Basophils Absolute: 0.1 10*3/uL (ref 0.0–0.1)
Basophils Relative: 1 %
Eosinophils Absolute: 0.9 10*3/uL — ABNORMAL HIGH (ref 0.0–0.5)
Eosinophils Relative: 15 %
HCT: 29.1 % — ABNORMAL LOW (ref 39.0–52.0)
Hemoglobin: 9.4 g/dL — ABNORMAL LOW (ref 13.0–17.0)
Immature Granulocytes: 1 %
Lymphocytes Relative: 28 %
Lymphs Abs: 1.8 10*3/uL (ref 0.7–4.0)
MCH: 32.2 pg (ref 26.0–34.0)
MCHC: 32.3 g/dL (ref 30.0–36.0)
MCV: 99.7 fL (ref 80.0–100.0)
Monocytes Absolute: 0.5 10*3/uL (ref 0.1–1.0)
Monocytes Relative: 9 %
Neutro Abs: 3 10*3/uL (ref 1.7–7.7)
Neutrophils Relative %: 46 %
Platelets: 207 10*3/uL (ref 150–400)
RBC: 2.92 MIL/uL — ABNORMAL LOW (ref 4.22–5.81)
RDW: 12.8 % (ref 11.5–15.5)
WBC: 6.3 10*3/uL (ref 4.0–10.5)
nRBC: 0 % (ref 0.0–0.2)

## 2019-08-11 LAB — COMPREHENSIVE METABOLIC PANEL
ALT: 12 U/L (ref 0–44)
AST: 21 U/L (ref 15–41)
Albumin: 3.8 g/dL (ref 3.5–5.0)
Alkaline Phosphatase: 70 U/L (ref 38–126)
Anion gap: 7 (ref 5–15)
BUN: 25 mg/dL — ABNORMAL HIGH (ref 8–23)
CO2: 24 mmol/L (ref 22–32)
Calcium: 9 mg/dL (ref 8.9–10.3)
Chloride: 109 mmol/L (ref 98–111)
Creatinine, Ser: 2.17 mg/dL — ABNORMAL HIGH (ref 0.61–1.24)
GFR calc Af Amer: 31 mL/min — ABNORMAL LOW (ref >60)
GFR calc non Af Amer: 27 mL/min — ABNORMAL LOW (ref >60)
Glucose, Bld: 119 mg/dL — ABNORMAL HIGH (ref 70–99)
Potassium: 3.9 mmol/L (ref 3.5–5.1)
Sodium: 140 mmol/L (ref 135–145)
Total Bilirubin: 0.5 mg/dL (ref 0.3–1.2)
Total Protein: 6.7 g/dL (ref 6.5–8.1)

## 2019-08-11 LAB — PSA: Prostatic Specific Antigen: 0.95 ng/mL (ref 0.00–4.00)

## 2019-08-11 MED ORDER — SODIUM CHLORIDE 0.9 % IV SOLN
Freq: Once | INTRAVENOUS | Status: AC
  Filled 2019-08-11: qty 250

## 2019-08-11 MED ORDER — SODIUM CHLORIDE 0.9 % IV SOLN
240.0000 mg | Freq: Once | INTRAVENOUS | Status: AC
  Administered 2019-08-11: 240 mg via INTRAVENOUS
  Filled 2019-08-11: qty 24

## 2019-08-11 NOTE — Assessment & Plan Note (Addendum)
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  Stage IV; FEB 12th, 2021-  CT-chest and pelvis-noncontrast- negative for recurrence; prostatomegaly /thickening at the GE junction  [see below]. chronic thickening of bladder stable.  Currently on opdivo.  Stable  #Proceed with Opdivo today. Labs today reviewed;  acceptable for treatment today.    # Iatrogenic hypothyroidism-on Synthroid 88 mcg; stable TSH January 2021-0.3.   # Enlarged prostate-on CT asymptomatic [Dr.Stoiff]; awaiting PSA from today.    # Anemia sec to CKD/ on Iv iron.Hb-9.1; STABLE.   # Dysphagia- intermittent- ? Solids-dg esophagogram-B- ring noted.  Recommend GI evaluation.  Will refer to Dr. Vicente Males.  Also discussed with speech pathology; recommend mechanical soft diet while awaiting evaluation with GI.  Patient to be evaluated by speech pathology today, Ms.Shon Baton.  # CKD stage III-IV-stable; creatinine stable.  # DISPOSITION:  # referral to Dr.Anna/ GI AS:[id number]  # treatment today. # Follow-up in 2 weeks- -MD labs-cbc/cmp; [name] IV-Dr.B

## 2019-08-11 NOTE — Progress Notes (Signed)
[place] CONSULT NOTE  Patient Care Team: [name], [name], MD as Consulting Physician (Hematology and Oncology)  CHIEF COMPLAINTS/PURPOSE OF CONSULTATION: Bladder cancer   Oncology History Overview Note  # AUG 2019-TRANSITIONAL CELL BLADDER CA [~ 4cm tumor] s/p cystoscopy [Dr.Stoiff]  with extensive angiolymphatic invasion; [name]; [name]; mediastinal uptake; right pubic rami uptake.  # [name]; Jan 18th 2021- switched to [place]- [pt preference; [name]; [name]; [name]  ;GOALS: palliative  CURRENT/MOST RECENT THERAPY:OPDIVO [C]     Cancer of overlapping sites of bladder ([place])  02/28/2018 - 06/29/2019 Chemotherapy   The patient had atezolizumab (TECENTRIQ) 1,[name], 1,200 mg, Intravenous, Once, [name],200 mg (02/28/2018), 1,200 mg (03/21/2018), 1,200 mg (04/11/2018), 1,200 mg (05/02/2018), 1,200 mg (06/13/2018), 1,200 mg (05/23/2018), 1,200 mg (07/04/2018), 1,200 mg (07/25/2018), 1,200 mg (08/15/2018), 1,200 mg (09/05/2018), 1,200 mg (10/25/2018), 1,200 mg (11/22/2018), 1,200 mg (12/20/2018), 1,200 mg (01/10/2019), 1,200 mg (01/31/2019), 1,200 mg (02/21/2019), 1,200 mg (03/14/2019), 1,200 mg (04/04/2019), 1,200 mg (04/25/2019), 1,200 mg (05/16/2019), 1,200 mg (06/09/2019)  for chemotherapy treatment.    06/30/2019 -  Chemotherapy   The patient had nivolumab (OPDIVO) 240 mg in sodium chloride 0.9 % 100 mL chemo infusion, 240 mg, Intravenous, Once, 3 of 6  cycles Administration: 240 mg (06/30/2019), 240 mg (07/14/2019), 240 mg (07/28/2019)  for chemotherapy treatment.     HISTORY OF PRESENTING ILLNESS: Edward Cummings 86 y.o.  male with metastatic transitional carcinoma of the bladder currently on Opdivo is here for follow-up/review results barium esophagogram.  Patient continues to have intermittent difficulty swallowing.  Has intermittent cough while swallowing.  Otherwise no nausea no vomiting.  No fevers or chills.  Appetite is good.  Mild fatigue.  No blood in urine.  Review of Systems  Constitutional: Positive for malaise/fatigue. Negative for chills, [name], hemoptysis, sputum production and wheezing.   Cardiovascular: Negative for chest pain, palpitations, orthopnea and leg swelling.  Gastrointestinal: Negative for abdominal pain, blood in stool, diarrhea, heartburn, melena, nausea and vomiting.  Genitourinary: Negative for dysuria, frequency and urgency.  Musculoskeletal: Positive for back pain. Negative for joint pain.  Skin: Negative.  Negative for itching and rash.  Neurological: Negative for tingling, focal weakness, weakness and headaches.  Endo/Heme/Allergies: Does not bruise/bleed easily.  Psychiatric/Behavioral: Negative for depression. The patient is not nervous/anxious and does not have insomnia.      MEDICAL HISTORY:  Past Medical History:  Diagnosis Date  . Anemia   . Cancer ([place])    bladder  . Chronic kidney disease   . Depression   . Hypertension   . Neuromuscular disorder ([place])    Nerve damage to left face/eye since around 2002.    SURGICAL HISTORY: Past Surgical History:  Procedure Laterality Date  . CYSTOSCOPY W/ RETROGRADES Bilateral 01/25/2018   Procedure: CYSTOSCOPY WITH RETROGRADE PYELOGRAM;  [name],  MD;  Location:  [facility] ORS;  Service: Urology;  Laterality: Bilateral;  . CYSTOSCOPY W/ URETERAL STENT PLACEMENT Bilateral 01/06/2019   Procedure: CYSTOSCOPY WITH RETROGRADE PYELOGRAM/URETERAL STENT REMOVAL;  [name], MD;  [name];  Service: Urology;  Laterality: Bilateral;  . CYSTOSCOPY WITH STENT PLACEMENT Bilateral 01/25/2018   Procedure: CYSTOSCOPY WITH STENT PLACEMENT;  [name], MD;  [name];  Service: Urology;  Laterality: Bilateral;  . DORSAL SLIT N/A 01/06/2019   Procedure: DORSAL SLIT;  [name], MD;  [name];  Service: Urology;  [name];  . EYE SURGERY     Cornea transplants bilaterally & cataract surgery.  . TRANSURETHRAL RESECTION OF BLADDER TUMOR N/A 01/25/2018   Procedure: TRANSURETHRAL RESECTION OF BLADDER TUMOR (TURBT);  [name], MD;  [name];  Service: Urology;  [name];    SOCIAL HISTORY: lives in [place]; with wife; quit smoking 18 years ago; beer every 2 months or so.mechanic/retd.   Social History   Socioeconomic History  . Marital status: Married    Spouse name: [name]  . Number of children: Not on file  . Years of education: Not on file  . Highest education level: Not on file  Occupational History  . Not on file  Tobacco Use  . Smoking status: Former Research scientist (life sciences)  . Smokeless tobacco: Current User    Types: Chew  . Tobacco comment: Stopped approximately 10 years ago.  Substance and Sexual Activity  . Alcohol use: Yes    Alcohol/week: 2.0 standard drinks    Types: 2 Cans of beer per week    Comment: Daily  . Drug use: Never  . Sexual activity: Yes    Birth control/protection: None  Other Topics Concern  . Not on file  Social History Narrative  . Not on file   Social Determinants of Health   Financial Resource Strain:   . Difficulty of Paying Living Expenses: Not on file  Food Insecurity:   . Worried About Charity fundraiser in the Last Year:  Not on file  . Ran Out of Food in the Last Year: Not on file  Transportation Needs:   . Lack of Transportation (Medical): Not on file  . Lack of Transportation (Non-Medical): Not on file  Physical Activity:   . Days of Exercise per Week: Not on file  . Minutes of Exercise per Session: Not on file  Stress:   . Feeling of Stress : Not on file  Social Connections:   . Frequency of Communication with Friends and Family: Not on file  . Frequency of Social Gatherings with Friends and Family: Not on file  . Attends Religious Services: Not on file  . Active Member of Clubs or Organizations: Not on file  . Attends Archivist Meetings: Not on file  . Marital Status: Not on file  Intimate Partner Violence:   . Fear of Current or Ex-Partner: Not on file  . Emotionally Abused: Not on file  . Physically Abused: Not on file  . Sexually Abused: Not on file    FAMILY HISTORY: Family History  Problem Relation Age of Onset  . Prostate cancer Neg Hx   . Kidney cancer Neg Hx   . Bladder Cancer Neg Hx     ALLERGIES:  has No Known Allergies.  MEDICATIONS:  Current Outpatient Medications  Medication Sig Dispense Refill  . amLODipine (NORVASC) 5 MG tablet Take 1 tablet (5 mg total)  by mouth daily. 30 tablet 3  . aspirin EC 81 MG tablet Take 81 mg by mouth daily.     Marland Kitchen docusate sodium (COLACE) 50 MG capsule Take 50 mg by mouth at bedtime.    . fexofenadine (ALLEGRA) 180 MG tablet Take 180 mg by mouth daily.    . fluticasone (FLONASE) 50 MCG/ACT nasal spray Place 2 sprays into both nostrils daily as needed for allergies or rhinitis.    Marland Kitchen HYDROcodone-acetaminophen (NORCO/VICODIN) 5-325 MG tablet Take 1 tablet by mouth every 8 (eight) hours as needed for moderate pain. 30 tablet 0  . levothyroxine (SYNTHROID) 88 MCG tablet Take 1 tablet (88 mcg total) by mouth daily before breakfast. Empty stomach- 1 hour prior to breakfast. 90 tablet 3  . Multiple Vitamin (MULTIVITAMIN WITH MINERALS) TABS  tablet Take 1 tablet by mouth daily.     Marland Kitchen oxybutynin (DITROPAN) 5 MG tablet Take 1 tablet by mouth 3 (three) times daily as needed.    . prednisoLONE acetate (PRED FORTE) 1 % ophthalmic suspension Place 1 drop into both eyes daily.    . tamsulosin (FLOMAX) 0.4 MG CAPS capsule Take 1 capsule (0.4 mg total) by mouth at bedtime. 90 capsule 1   No current facility-administered medications for this visit.      Marland Kitchen  PHYSICAL EXAMINATION: ECOG PERFORMANCE STATUS: 1 - Symptomatic but completely ambulatory  Vitals:   08/11/19 0828  BP: 117/60  Pulse: (!) 55  Temp: (!) 96.6 F (35.9 C)  SpO2: 100%   Filed Weights   08/11/19 0828  Weight: 124 lb (56.2 kg)    Physical Exam  Constitutional: He is oriented to person, place, and time.  With his wife  Walking himself.  Thin built moderately nourished male patient.  HENT:  Head: Normocephalic and atraumatic.  Mouth/Throat: Oropharynx is clear and moist. No oropharyngeal exudate.  Eyes: Pupils are equal, round, and reactive to light.  Chronic drooping of the left eyelid.  Cardiovascular: Normal rate and regular rhythm.  Pulmonary/Chest: No respiratory distress. He has no wheezes.  Abdominal: Soft. Bowel sounds are normal. He exhibits no distension and no mass. There is no abdominal tenderness. There is no rebound and no guarding.  Musculoskeletal:        General: No tenderness or edema. Normal range of motion.     Cervical back: Normal range of motion and neck supple.  Neurological: He is alert and oriented to person, place, and time.  Skin: Skin is warm.  Psychiatric: Affect normal.     LABORATORY DATA:  I have reviewed the data as listed Lab Results  Component Value Date   WBC 6.3 08/11/2019   HGB 9.4 (L) 08/11/2019   HCT 29.1 (L) 08/11/2019   MCV 99.7 08/11/2019   PLT 207 08/11/2019   Recent Labs    07/14/19 0810 07/28/19 0844 08/11/19 0757  NA 138 140 140  K 4.5 4.0 3.9  CL 108 109 109  CO2 '23 24 24  ' GLUCOSE 99 120*  119*  BUN 25* 29* 25*  CREATININE 2.10* 2.50* 2.17*  CALCIUM 8.9 9.1 9.0  GFRNONAA 28* 23* 27*  GFRAA 33* 26* 31*  PROT 6.8 7.3 6.7  ALBUMIN 3.8 4.1 3.8  AST '22 22 21  ' ALT '16 16 12  ' ALKPHOS 77 76 70  BILITOT 0.5 0.7 0.5    RADIOGRAPHIC STUDIES: I have personally reviewed the radiological images as listed and agreed with the findings in the report. CT ABDOMEN PELVIS WO CONTRAST  Result Date:  07/25/2019 CLINICAL DATA:  Bladder cancer on immunotherapy. EXAM: CT CHEST, ABDOMEN AND PELVIS WITHOUT CONTRAST TECHNIQUE: Multidetector CT imaging of the chest, abdomen and pelvis was performed following the standard protocol without IV contrast. COMPARISON:  03/31/2019. FINDINGS: CT CHEST FINDINGS Cardiovascular: Atherosclerotic calcification of the aorta, aortic valve and coronary arteries. Heart size normal. No pericardial effusion. Mediastinum/Nodes: Mediastinal lymph nodes are not enlarged by CT size criteria. Hilar regions are difficult to definitively evaluate without IV contrast but appear grossly unremarkable. No axillary adenopathy. Esophagus is grossly unremarkable. Lungs/Pleura: Centrilobular and paraseptal emphysema. Calcified pleural plaques bilaterally. Basilar predominant subpleural reticulation, ground-glass and traction bronchiolectasis, similar. 3 mm peripheral right upper lobe nodule (3/61), stable. 3 mm peripheral left lower lobe nodule (3/129), stable. Calcified granulomas. No pleural fluid. Airway is unremarkable. Musculoskeletal: No worrisome lytic or sclerotic lesions. CT ABDOMEN PELVIS FINDINGS Hepatobiliary: Liver is unremarkable. Suspect noncalcified stones in a contracted gallbladder. No biliary ductal dilatation. Pancreas: Negative. Spleen: Negative. Adrenals/Urinary Tract: Adrenal glands are unremarkable. Low-attenuation lesions in the kidneys measure up to 2.0 cm on the right and are likely cysts. No urinary stones. Ureters are decompressed. Bladder may be slightly thick-walled.  Stomach/Bowel: Wall thickening at the gastroesophageal junction, as before. Stomach, small bowel, appendix and colon are otherwise unremarkable. Vascular/Lymphatic: Atherosclerotic calcification of the aorta without aneurysm. No pathologically enlarged lymph nodes. Reproductive: Prostate is enlarged and indents the bladder. Other: No free fluid.  Mesenteries and peritoneum are unremarkable. Musculoskeletal: Degenerative changes in the spine. No worrisome lytic or sclerotic lesions. IMPRESSION: 1. No evidence of metastatic disease. 2. Enlarged prostate. Slight bladder trabeculation indicates an element of outlet obstruction. 3. Basilar pulmonary fibrosis with calcified pleural plaques, findings indicative of asbestosis. 4. Wall thickening at the gastroesophageal junction, as before. Please correlate clinically. 5. Cholelithiasis. 6. Aortic atherosclerosis (ICD10-I70.0). Coronary artery calcification. 7.  Emphysema (ICD10-J43.9). Electronically Signed   By: Lorin Picket M.D.   On: 07/25/2019 11:46   CT CHEST WO CONTRAST  Result Date: 07/25/2019 CLINICAL DATA:  Bladder cancer on immunotherapy. EXAM: CT CHEST, ABDOMEN AND PELVIS WITHOUT CONTRAST TECHNIQUE: Multidetector CT imaging of the chest, abdomen and pelvis was performed following the standard protocol without IV contrast. COMPARISON:  03/31/2019. FINDINGS: CT CHEST FINDINGS Cardiovascular: Atherosclerotic calcification of the aorta, aortic valve and coronary arteries. Heart size normal. No pericardial effusion. Mediastinum/Nodes: Mediastinal lymph nodes are not enlarged by CT size criteria. Hilar regions are difficult to definitively evaluate without IV contrast but appear grossly unremarkable. No axillary adenopathy. Esophagus is grossly unremarkable. Lungs/Pleura: Centrilobular and paraseptal emphysema. Calcified pleural plaques bilaterally. Basilar predominant subpleural reticulation, ground-glass and traction bronchiolectasis, similar. 3 mm peripheral  right upper lobe nodule (3/61), stable. 3 mm peripheral left lower lobe nodule (3/129), stable. Calcified granulomas. No pleural fluid. Airway is unremarkable. Musculoskeletal: No worrisome lytic or sclerotic lesions. CT ABDOMEN PELVIS FINDINGS Hepatobiliary: Liver is unremarkable. Suspect noncalcified stones in a contracted gallbladder. No biliary ductal dilatation. Pancreas: Negative. Spleen: Negative. Adrenals/Urinary Tract: Adrenal glands are unremarkable. Low-attenuation lesions in the kidneys measure up to 2.0 cm on the right and are likely cysts. No urinary stones. Ureters are decompressed. Bladder may be slightly thick-walled. Stomach/Bowel: Wall thickening at the gastroesophageal junction, as before. Stomach, small bowel, appendix and colon are otherwise unremarkable. Vascular/Lymphatic: Atherosclerotic calcification of the aorta without aneurysm. No pathologically enlarged lymph nodes. Reproductive: Prostate is enlarged and indents the bladder. Other: No free fluid.  Mesenteries and peritoneum are unremarkable. Musculoskeletal: Degenerative changes in the spine. No worrisome lytic or sclerotic  lesions. IMPRESSION: 1. No evidence of metastatic disease. 2. Enlarged prostate. Slight bladder trabeculation indicates an element of outlet obstruction. 3. Basilar pulmonary fibrosis with calcified pleural plaques, findings indicative of asbestosis. 4. Wall thickening at the gastroesophageal junction, as before. Please correlate clinically. 5. Cholelithiasis. 6. Aortic atherosclerosis (ICD10-I70.0). Coronary artery calcification. 7.  Emphysema (ICD10-J43.9). Electronically Signed   By: Lorin Picket M.D.   On: 07/25/2019 11:46   DG ESOPHAGUS W SINGLE CM (SOL OR THIN BA)  Result Date: 08/07/2019 CLINICAL DATA:  Dysphagia. EXAM: ESOPHOGRAM/BARIUM SWALLOW TECHNIQUE: Combined double contrast and single contrast examination performed using effervescent crystals, thick barium liquid, and thin barium liquid.  FLUOROSCOPY TIME:  Fluoroscopy Time:  0 minutes 30 seconds Radiation Exposure Index (if provided by the fluoroscopic device): 11 mGy COMPARISON:  CT chest 07/25/2019. FINDINGS: Cervical esophagus is widely patent. Barium aspiration noted. Small sliding hiatal hernia with slightly prominent B ring noted. No significant focal esophageal thickening noted. No evidence of esophageal mass. No reflux. Barium tablet was not administered due to the patient's aspiration. IMPRESSION: 1.  Barium aspiration. 2. Small sliding hiatal hernia with slightly prominent B ring noted. No significant focal esophageal thickening. No evidence of esophageal mass. No reflux. Electronically Signed   By: Marcello Moores  Register   On: 08/07/2019 09:55    ASSESSMENT & PLAN:   Cancer of overlapping sites of bladder ([place]) # High-grade transitional cell carcinoma of the bladder metastatic to retroperitoneal lymph node.  Stage IV; FEB 12th, 2021-  CT-chest and pelvis-noncontrast- negative for recurrence; prostatomegaly /thickening at the GE junction  [see below]. chronic thickening of bladder stable.  Currently on opdivo.  Stable  #Proceed with Opdivo today. Labs today reviewed;  acceptable for treatment today.    # Iatrogenic hypothyroidism-on Synthroid 88 mcg; stable TSH January 2021-0.3.   # Enlarged prostate-on CT asymptomatic [Dr.Stoiff]; awaiting PSA from today.    # Anemia sec to CKD/ on Iv iron.Hb-9.1; STABLE.   # Dysphagia- intermittent- ? Solids-dg esophagogram-B- ring noted.  Recommend GI evaluation.  Will refer to Dr. Vicente Males.  Also discussed with speech pathology; recommend mechanical soft diet while awaiting evaluation with GI.  Patient to be evaluated by speech pathology today, Ms.Shon Baton.  # CKD stage III-IV-stable; creatinine stable.  # DISPOSITION:  # referral to Dr.Anna/ GI [id number]  # treatment today. # Follow-up in 2 weeks- -MD labs-cbc/cmp; [name] IV-Dr.B   All questions were answered. The patient knows to  call the clinic with any problems, questions or concerns.    Cammie Sickle, MD 08/11/2019 9:11 AM

## 2019-08-12 ENCOUNTER — Telehealth: Payer: Self-pay | Admitting: Internal Medicine

## 2019-08-12 NOTE — Telephone Encounter (Signed)
On 3/1-updated patient's son Ronalee Belts regarding patient's status-reviewed the results of the esophagogram/also reviewed recommendations from speech path.  Understands that patient will need a GI evaluation.

## 2019-08-13 NOTE — Therapy (Signed)
[facility] [address] [place], [place], [address] Phone: [phone]   Fax:  [phone]  Speech Language Pathology Screen  Patient Details  Name: Edward Cummings MRN: [id number] Date of Birth: 07/01/1932 No data recorded  Encounter Date: 08/11/2019    Past Medical History:  Diagnosis Date  . Anemia   . Cancer ([facility])    bladder  . Chronic kidney disease   . Depression   . Hypertension   . Neuromuscular disorder ([facility])    Nerve damage to left face/eye since around 2002.    Past Surgical History:  Procedure Laterality Date  . CYSTOSCOPY W/ RETROGRADES Bilateral 01/25/2018   Procedure: CYSTOSCOPY WITH RETROGRADE PYELOGRAM;  Surgeon: Stoioff, Scott C, MD;  [name];  Service: Urology;  Laterality: Bilateral;  . CYSTOSCOPY W/ URETERAL STENT PLACEMENT Bilateral 01/06/2019   Procedure: CYSTOSCOPY WITH RETROGRADE PYELOGRAM/URETERAL STENT REMOVAL;  Surgeon: Stoioff, Scott C, MD;  [name];  Service: Urology;  Laterality: Bilateral;  . CYSTOSCOPY WITH STENT PLACEMENT Bilateral 01/25/2018   Procedure: CYSTOSCOPY WITH STENT PLACEMENT;  Surgeon: Stoioff, Scott C, MD;  [name];  Service: Urology;  Laterality: Bilateral;  . DORSAL SLIT N/A 01/06/2019   Procedure: DORSAL SLIT;  Surgeon: Stoioff, Scott C, MD;  [name];  Service: Urology;  [name];  . EYE SURGERY     Cornea transplants bilaterally & cataract surgery.  . TRANSURETHRAL RESECTION OF BLADDER TUMOR N/A 01/25/2018   Procedure: TRANSURETHRAL RESECTION OF BLADDER TUMOR (TURBT);  Surgeon: Stoioff, Scott C, MD;  [name];  Service: Urology;  [name];    There were no vitals filed for this visit.        08/11/2019: Received referral from MD. Pt had a recent DG Esophagus on 08/07/2019 which revealed: "Small sliding hiatal hernia with slightly prominent B ring noted. No significant focal esophageal thickening. No  evidence of esophageal mass". Pt had barium aspiration occur during the study per note. CT Chest on 07/25/2019 revealed: "Wall thickening at the gastroesophageal junction; Basilar pulmonary fibrosis with calcified pleural plaques". No acute infiltrates documented. Met w/ pt during Infusion tx regarding pt's diet consistency and questions re: any swallowing issues. Pt denied any overt s/s of aspiration during oral intake at home/meals; pt stated he drank water, drinks at home w/ No overt coughing or throat clearing noted. When asked about the DG Esophagus study, pt stated he had to "lie down and drink" - this may be contributed to the barium aspiration. Regarding mastication and eating of solid foods, pt endorsed mild s/s of Esophageal fullness w/ certain bulkier foods such as breads/meats but denied any difficulty chewing the foods. Noted results of the DG Esophagus including a B-ring, hiatal hernia w/ wall thickening at the GE Junction per Chest CT. All of these issues can contribute to Esophageal Dysmotility such as pt's complaints. Noted he was eating peanut butter crackers at this appt. which he denied difficulty with. Pt endorsed lack of taste of foods and min lack of desire. Pt is followed by the Dietician during his tx; nutritional supplements ordered.  Gave education on, and discussed w/ pt, general Reflux strategies, Esophageal dysmotility strategies, food prep and choices for easier Esophageal motility, and general aspiration precautions. Handouts given on the above. Recommend continued f/u w/ GI for ongoing management and education. Pt agreed and had no further questions.  NSG updated at end of session.                       Dysphagia, unspecified type    Problem List Patient Active Problem List   Diagnosis Date Noted  . Goals of care, counseling/discussion 07/25/2018  . Syncope 04/13/2018  . Cancer of overlapping sites of bladder ([facility]) 02/04/2018  . Anemia due to stage 3  chronic kidney disease 02/04/2018  . AKI (acute kidney injury) ([facility]) 12/12/2017  . Hypertension 11/19/2013  . Fuchs' corneal dystrophy 10/10/2013  . Nuclear sclerosis of both eyes 10/10/2013        , MS, CCC-SLP , 08/13/2019, 10:32 AM  [facility] [address] [place], [place], [address] Phone: [phone]   Fax:  [phone]  Name: Edward Cummings MRN: [id number] Date of Birth: 12/28/1932 

## 2019-08-19 ENCOUNTER — Encounter: Payer: Self-pay | Admitting: Gastroenterology

## 2019-08-20 ENCOUNTER — Ambulatory Visit (INDEPENDENT_AMBULATORY_CARE_PROVIDER_SITE_OTHER): Payer: Medicare HMO | Admitting: Gastroenterology

## 2019-08-20 ENCOUNTER — Other Ambulatory Visit: Payer: Self-pay

## 2019-08-20 ENCOUNTER — Encounter: Payer: Self-pay | Admitting: Gastroenterology

## 2019-08-20 DIAGNOSIS — R1310 Dysphagia, unspecified: Secondary | ICD-10-CM

## 2019-08-20 NOTE — Progress Notes (Signed)
Edward Cummings [address]  [place], [place] [address]  Main: [phone]  Fax: [phone]   Gastroenterology Consultation  Referring Provider:     Cammie Sickle, * Primary Care Physician:  [name], MD Reason for Consultation:     dysphagia        HPI:   Virtual Visit via Telephone Note  I connected with patient on 08/20/19 at 11:00 AM EST by telephone and verified that I am speaking with the correct person using two identifiers.   I discussed the limitations, risks, security and privacy concerns of performing an evaluation and management service by telephone and the availability of in person appointments. I also discussed with the patient that there may be a patient responsible charge related to this service. The patient expressed understanding and agreed to proceed.  Location of the patient: Home Location of provider: Home Participating persons: Patient, his son Edward Cummings, and provider only   History of Present Illness: Chief Complaint  Patient presents with  . New Patient (Initial Visit)  . Dysphagia    Patient states if he takes his time and chew  he has no promblem      Edward Cummings is a 86 y.o. y/o male referred for consultation & management  by Dr. [name], MD.  Patient with history of bladder cancer with metastasis to retroperitoneal lymph nodes, stage IV, referred to [place] for dysphagia.  Patient is poor historian, history is provided by patient and son.  They report 2 episodes of dysphagia to solid foods over the last month.  They state solid food felt like it slowed down in his chest but eventually passed.  No episodes of food impaction.  No prior EGD.  No nausea or vomiting.  CT scan showed thickening at the GE junction and esophagram showed sliding hiatal hernia with slightly prominent B ring with no significant focal esophageal thickening.  Patient was evaluated by speech pathology and they recommend further GI evaluation as  well.  Past Medical History:  Diagnosis Date  . Anemia   . Cancer ([place])    bladder  . Chronic kidney disease   . Depression   . Hypertension   . Neuromuscular disorder ([place])    Nerve damage to left face/eye since around 2002.    Past Surgical History:  Procedure Laterality Date  . CYSTOSCOPY W/ RETROGRADES Bilateral 01/25/2018   Procedure: CYSTOSCOPY WITH RETROGRADE PYELOGRAM;  [name], MD;  [name];  Service: Urology;  Laterality: Bilateral;  . CYSTOSCOPY W/ URETERAL STENT PLACEMENT Bilateral 01/06/2019   Procedure: CYSTOSCOPY WITH RETROGRADE PYELOGRAM/URETERAL STENT REMOVAL;  [name], MD;  [name];  Service: Urology;  Laterality: Bilateral;  . CYSTOSCOPY WITH STENT PLACEMENT Bilateral 01/25/2018   Procedure: CYSTOSCOPY WITH STENT PLACEMENT;  [name], MD;  [name];  Service: Urology;  Laterality: Bilateral;  . DORSAL SLIT N/A 01/06/2019   Procedure: DORSAL SLIT;  [name], MD;  [name];  Service: Urology;  [name];  . EYE SURGERY     Cornea transplants bilaterally & cataract surgery.  . TRANSURETHRAL RESECTION OF BLADDER TUMOR N/A 01/25/2018   Procedure: TRANSURETHRAL RESECTION OF BLADDER TUMOR (TURBT);  [name], MD;  [name];  Service: Urology;  [name];    Prior to Admission medications   Medication Sig Start Date End Date Taking? Authorizing Provider  amLODipine (NORVASC) 5 MG tablet Take  1 tablet (5 mg total) by mouth daily. 04/11/18  Yes Cammie Sickle, MD  aspirin EC 81 MG tablet Take 81 mg by mouth daily.    Yes [provider]  docusate sodium (COLACE) 50 MG capsule Take 50 mg by mouth at bedtime.   Yes [provider]  fexofenadine (ALLEGRA) 180 MG tablet Take 180 mg by mouth daily.   Yes [provider]  fluticasone (FLONASE) 50 MCG/ACT nasal spray Place 2 sprays into both nostrils  daily as needed for allergies or rhinitis.   Yes [provider]  HYDROcodone-acetaminophen (NORCO/VICODIN) 5-325 MG tablet Take 1 tablet by mouth every 8 (eight) hours as needed for moderate pain. 01/10/19  Yes Cammie Sickle, MD  levothyroxine (SYNTHROID) 88 MCG tablet Take 1 tablet (88 mcg total) by mouth daily before breakfast. Empty stomach- 1 hour prior to breakfast. 07/28/19  Yes Cammie Sickle, MD  Multiple Vitamin (MULTIVITAMIN WITH MINERALS) TABS tablet Take 1 tablet by mouth daily.    Yes [provider]  oxybutynin (DITROPAN) 5 MG tablet Take 1 tablet by mouth 3 (three) times daily as needed. 02/22/19  Yes [provider]  prednisoLONE acetate (PRED FORTE) 1 % ophthalmic suspension Place 1 drop into both eyes daily.   Yes [provider]  tamsulosin (FLOMAX) 0.4 MG CAPS capsule Take 1 capsule (0.4 mg total) by mouth at bedtime. 09/05/18  Yes Cammie Sickle, MD    Family History  Problem Relation Age of Onset  . Prostate cancer Neg Hx   . Kidney cancer Neg Hx   . Bladder Cancer Neg Hx      Social History   Tobacco Use  . Smoking status: Former Research scientist (life sciences)  . Smokeless tobacco: Current User    Types: Chew  . Tobacco comment: Stopped approximately 10 years ago.  Substance Use Topics  . Alcohol use: Not Currently    Alcohol/week: 2.0 standard drinks    Types: 2 Cans of beer per week    Comment: Daily  . Drug use: Never    Allergies as of 08/20/2019  . (No Known Allergies)    Review of Systems:    All systems reviewed and negative except where noted in HPI.   Observations/Objective:  Labs: CBC    Component Value Date/Time   WBC 6.3 08/11/2019 0757   RBC 2.92 (L) 08/11/2019 0757   HGB 9.4 (L) 08/11/2019 0757   HCT 29.1 (L) 08/11/2019 0757   PLT 207 08/11/2019 0757   MCV 99.7 08/11/2019 0757   MCH 32.2 08/11/2019 0757   MCHC 32.3 08/11/2019 0757   RDW 12.8 08/11/2019 0757   LYMPHSABS 1.8 08/11/2019 0757    MONOABS 0.5 08/11/2019 0757   EOSABS 0.9 (H) 08/11/2019 0757   BASOSABS 0.1 08/11/2019 0757   CMP     Component Value Date/Time   NA 140 08/11/2019 0757   K 3.9 08/11/2019 0757   CL 109 08/11/2019 0757   CO2 24 08/11/2019 0757   GLUCOSE 119 (H) 08/11/2019 0757   BUN 25 (H) 08/11/2019 0757   CREATININE 2.17 (H) 08/11/2019 0757   CALCIUM 9.0 08/11/2019 0757   PROT 6.7 08/11/2019 0757   ALBUMIN 3.8 08/11/2019 0757   AST 21 08/11/2019 0757   ALT 12 08/11/2019 0757   ALKPHOS 70 08/11/2019 0757   BILITOT 0.5 08/11/2019 0757   GFRNONAA 27 (L) 08/11/2019 0757   GFRAA 31 (L) 08/11/2019 0757    Imaging Studies: CT ABDOMEN PELVIS WO CONTRAST  Result Date: 07/25/2019 CLINICAL DATA:  Bladder cancer on immunotherapy. EXAM: CT CHEST, ABDOMEN AND PELVIS WITHOUT CONTRAST TECHNIQUE: Multidetector CT imaging of the chest, abdomen and pelvis was performed following the standard protocol without IV contrast. COMPARISON:  03/31/2019. FINDINGS: CT CHEST FINDINGS Cardiovascular: Atherosclerotic calcification of the aorta, aortic valve and coronary arteries. Heart size normal. No pericardial effusion. Mediastinum/Nodes: Mediastinal lymph nodes are not enlarged by CT size criteria. Hilar regions are difficult to definitively evaluate without IV contrast but appear grossly unremarkable. No axillary adenopathy. Esophagus is grossly unremarkable. Lungs/Pleura: Centrilobular and paraseptal emphysema. Calcified pleural plaques bilaterally. Basilar predominant subpleural reticulation, ground-glass and traction bronchiolectasis, similar. 3 mm peripheral right upper lobe nodule (3/61), stable. 3 mm peripheral left lower lobe nodule (3/129), stable. Calcified granulomas. No pleural fluid. Airway is unremarkable. Musculoskeletal: No worrisome lytic or sclerotic lesions. CT ABDOMEN PELVIS FINDINGS Hepatobiliary: Liver is unremarkable. Suspect noncalcified stones in a contracted gallbladder. No biliary ductal dilatation.  Pancreas: Negative. Spleen: Negative. Adrenals/Urinary Tract: Adrenal glands are unremarkable. Low-attenuation lesions in the kidneys measure up to 2.0 cm on the right and are likely cysts. No urinary stones. Ureters are decompressed. Bladder may be slightly thick-walled. Stomach/Bowel: Wall thickening at the gastroesophageal junction, as before. Stomach, small bowel, appendix and colon are otherwise unremarkable. Vascular/Lymphatic: Atherosclerotic calcification of the aorta without aneurysm. No pathologically enlarged lymph nodes. Reproductive: Prostate is enlarged and indents the bladder. Other: No free fluid.  Mesenteries and peritoneum are unremarkable. Musculoskeletal: Degenerative changes in the spine. No worrisome lytic or sclerotic lesions. IMPRESSION: 1. No evidence of metastatic disease. 2. Enlarged prostate. Slight bladder trabeculation indicates an element of outlet obstruction. 3. Basilar pulmonary fibrosis with calcified pleural plaques, findings indicative of asbestosis. 4. Wall thickening at the gastroesophageal junction, as before. Please correlate clinically. 5. Cholelithiasis. 6. Aortic atherosclerosis (ICD10-I70.0). Coronary artery calcification. 7.  Emphysema (ICD10-J43.9). Electronically Signed   By: Lorin Picket M.D.   On: 07/25/2019 11:46   CT CHEST WO CONTRAST  Result Date: 07/25/2019 CLINICAL DATA:  Bladder cancer on immunotherapy. EXAM: CT CHEST, ABDOMEN AND PELVIS WITHOUT CONTRAST TECHNIQUE: Multidetector CT imaging of the chest, abdomen and pelvis was performed following the standard protocol without IV contrast. COMPARISON:  03/31/2019. FINDINGS: CT CHEST FINDINGS Cardiovascular: Atherosclerotic calcification of the aorta, aortic valve and coronary arteries. Heart size normal. No pericardial effusion. Mediastinum/Nodes: Mediastinal lymph nodes are not enlarged by CT size criteria. Hilar regions are difficult to definitively evaluate without IV contrast but appear grossly  unremarkable. No axillary adenopathy. Esophagus is grossly unremarkable. Lungs/Pleura: Centrilobular and paraseptal emphysema. Calcified pleural plaques bilaterally. Basilar predominant subpleural reticulation, ground-glass and traction bronchiolectasis, similar. 3 mm peripheral right upper lobe nodule (3/61), stable. 3 mm peripheral left lower lobe nodule (3/129), stable. Calcified granulomas. No pleural fluid. Airway is unremarkable. Musculoskeletal: No worrisome lytic or sclerotic lesions. CT ABDOMEN PELVIS FINDINGS Hepatobiliary: Liver is unremarkable. Suspect noncalcified stones in a contracted gallbladder. No biliary ductal dilatation. Pancreas: Negative. Spleen: Negative. Adrenals/Urinary Tract: Adrenal glands are unremarkable. Low-attenuation lesions in the kidneys measure up to 2.0 cm on the right and are likely cysts. No urinary stones. Ureters are decompressed. Bladder may be slightly thick-walled. Stomach/Bowel: Wall thickening at the gastroesophageal junction, as before. Stomach, small bowel, appendix and colon are otherwise unremarkable. Vascular/Lymphatic: Atherosclerotic calcification of the aorta without aneurysm. No pathologically enlarged lymph nodes. Reproductive: Prostate is enlarged and indents the bladder. Other: No free fluid.  Mesenteries and peritoneum are unremarkable. Musculoskeletal: Degenerative changes in the spine. No worrisome lytic  or sclerotic lesions. IMPRESSION: 1. No evidence of metastatic disease. 2. Enlarged prostate. Slight bladder trabeculation indicates an element of outlet obstruction. 3. Basilar pulmonary fibrosis with calcified pleural plaques, findings indicative of asbestosis. 4. Wall thickening at the gastroesophageal junction, as before. Please correlate clinically. 5. Cholelithiasis. 6. Aortic atherosclerosis (ICD10-I70.0). Coronary artery calcification. 7.  Emphysema (ICD10-J43.9). Electronically Signed   By: Lorin Picket M.D.   On: 07/25/2019 11:46   DG  ESOPHAGUS W SINGLE CM (SOL OR THIN BA)  Result Date: 08/07/2019 CLINICAL DATA:  Dysphagia. EXAM: ESOPHOGRAM/BARIUM SWALLOW TECHNIQUE: Combined double contrast and single contrast examination performed using effervescent crystals, thick barium liquid, and thin barium liquid. FLUOROSCOPY TIME:  Fluoroscopy Time:  0 minutes 30 seconds Radiation Exposure Index (if provided by the fluoroscopic device): 11 mGy COMPARISON:  CT chest 07/25/2019. FINDINGS: Cervical esophagus is widely patent. Barium aspiration noted. Small sliding hiatal hernia with slightly prominent B ring noted. No significant focal esophageal thickening noted. No evidence of esophageal mass. No reflux. Barium tablet was not administered due to the patient's aspiration. IMPRESSION: 1.  Barium aspiration. 2. Small sliding hiatal hernia with slightly prominent B ring noted. No significant focal esophageal thickening. No evidence of esophageal mass. No reflux. Electronically Signed   By: Marcello Moores  Register   On: 08/07/2019 09:55    Assessment and Plan:   Edward Cummings is a 86 y.o. y/o male has been referred for dysphagia to solid foods for the last month, 2 isolated episodes, with GE junction thickening noted on CT scan, with history of bladder cancer with metastasis to retroperitoneal lymph nodes, stage IV and on chemotherapy by oncology  Assessment and Plan: Esophagram did not show any esophageal mass, however GE junction thickening noted on CT and patient reporting new dysphagia, indicate further evaluation with endoscopy  EGD to rule out strictures, narrowing or malignancy at the site  I have discussed alternative options, risks & benefits,  which include, but are not limited to, bleeding, infection, perforation,respiratory complication & drug reaction.  The patient agrees with this plan & written consent will be obtained.     Follow Up Instructions: 1 to 2 weeks post EGD  I discussed the assessment and treatment plan with the  patient. The patient was provided an opportunity to ask questions and all were answered. The patient agreed with the plan and demonstrated an understanding of the instructions.   The patient was advised to call back or seek an in-person evaluation if the symptoms worsen or if the condition fails to improve as anticipated.  I provided 12 minutes of non-face-to-face time during this encounter.   Virgel Manifold, MD  Speech recognition software was used to dictate the above note.

## 2019-08-20 NOTE — Addendum Note (Signed)
Addended by: Ulyess Blossom L on: 08/20/2019 11:36 AM   Modules accepted: Orders

## 2019-08-23 ENCOUNTER — Other Ambulatory Visit: Payer: Self-pay | Admitting: Nurse Practitioner

## 2019-08-25 ENCOUNTER — Inpatient Hospital Stay: Payer: Medicare HMO

## 2019-08-25 ENCOUNTER — Other Ambulatory Visit: Payer: Self-pay

## 2019-08-25 ENCOUNTER — Inpatient Hospital Stay (HOSPITAL_BASED_OUTPATIENT_CLINIC_OR_DEPARTMENT_OTHER): Payer: Medicare HMO | Admitting: Internal Medicine

## 2019-08-25 DIAGNOSIS — Z7189 Other specified counseling: Secondary | ICD-10-CM

## 2019-08-25 DIAGNOSIS — C678 Malignant neoplasm of overlapping sites of bladder: Secondary | ICD-10-CM

## 2019-08-25 DIAGNOSIS — Z5112 Encounter for antineoplastic immunotherapy: Secondary | ICD-10-CM | POA: Diagnosis not present

## 2019-08-25 LAB — CBC WITH DIFFERENTIAL/PLATELET
Abs Immature Granulocytes: 0.02 10*3/uL (ref 0.00–0.07)
Basophils Absolute: 0.1 10*3/uL (ref 0.0–0.1)
Basophils Relative: 1 %
Eosinophils Absolute: 0.7 10*3/uL — ABNORMAL HIGH (ref 0.0–0.5)
Eosinophils Relative: 10 %
HCT: 28 % — ABNORMAL LOW (ref 39.0–52.0)
Hemoglobin: 9.4 g/dL — ABNORMAL LOW (ref 13.0–17.0)
Immature Granulocytes: 0 %
Lymphocytes Relative: 34 %
Lymphs Abs: 2.4 10*3/uL (ref 0.7–4.0)
MCH: 32.9 pg (ref 26.0–34.0)
MCHC: 33.6 g/dL (ref 30.0–36.0)
MCV: 97.9 fL (ref 80.0–100.0)
Monocytes Absolute: 0.6 10*3/uL (ref 0.1–1.0)
Monocytes Relative: 8 %
Neutro Abs: 3.2 10*3/uL (ref 1.7–7.7)
Neutrophils Relative %: 47 %
Platelets: 197 10*3/uL (ref 150–400)
RBC: 2.86 MIL/uL — ABNORMAL LOW (ref 4.22–5.81)
RDW: 12.6 % (ref 11.5–15.5)
WBC: 7 10*3/uL (ref 4.0–10.5)
nRBC: 0 % (ref 0.0–0.2)

## 2019-08-25 LAB — COMPREHENSIVE METABOLIC PANEL
ALT: 16 U/L (ref 0–44)
AST: 24 U/L (ref 15–41)
Albumin: 4.1 g/dL (ref 3.5–5.0)
Alkaline Phosphatase: 72 U/L (ref 38–126)
Anion gap: 5 (ref 5–15)
BUN: 34 mg/dL — ABNORMAL HIGH (ref 8–23)
CO2: 22 mmol/L (ref 22–32)
Calcium: 9 mg/dL (ref 8.9–10.3)
Chloride: 111 mmol/L (ref 98–111)
Creatinine, Ser: 2.5 mg/dL — ABNORMAL HIGH (ref 0.61–1.24)
GFR calc Af Amer: 26 mL/min — ABNORMAL LOW (ref >60)
GFR calc non Af Amer: 23 mL/min — ABNORMAL LOW (ref >60)
Glucose, Bld: 120 mg/dL — ABNORMAL HIGH (ref 70–99)
Potassium: 3.9 mmol/L (ref 3.5–5.1)
Sodium: 138 mmol/L (ref 135–145)
Total Bilirubin: 0.8 mg/dL (ref 0.3–1.2)
Total Protein: 6.9 g/dL (ref 6.5–8.1)

## 2019-08-25 MED ORDER — SODIUM CHLORIDE 0.9 % IV SOLN
240.0000 mg | Freq: Once | INTRAVENOUS | Status: AC
  Administered 2019-08-25: 240 mg via INTRAVENOUS
  Filled 2019-08-25: qty 24

## 2019-08-25 MED ORDER — SODIUM CHLORIDE 0.9 % IV SOLN
Freq: Once | INTRAVENOUS | Status: AC
  Filled 2019-08-25: qty 250

## 2019-08-25 NOTE — Progress Notes (Signed)
Edward Cummings CONSULT NOTE  Patient Care Team: [name], [name], MD as Consulting Physician (Hematology and Oncology)  CHIEF COMPLAINTS/PURPOSE OF CONSULTATION: Bladder cancer   Oncology History Overview Note  # AUG 2019-TRANSITIONAL CELL BLADDER CA [~ 4cm tumor] s/p cystoscopy [Dr.Stoiff]  with extensive angiolymphatic invasion; [name]; [name]; mediastinal uptake; right pubic rami uptake.  # [name]; Jan 18th 2021- switched to [place]- [pt preference; [name]; [name]; [name]  ;GOALS: palliative  CURRENT/MOST RECENT THERAPY:OPDIVO [C]     Cancer of overlapping sites of bladder ([place])  02/28/2018 - 06/29/2019 Chemotherapy   The patient had atezolizumab (TECENTRIQ) 1,[name], 1,200 mg, Intravenous, Once, [name],200 mg (02/28/2018), 1,200 mg (03/21/2018), 1,200 mg (04/11/2018), 1,200 mg (05/02/2018), 1,200 mg (06/13/2018), 1,200 mg (05/23/2018), 1,200 mg (07/04/2018), 1,200 mg (07/25/2018), 1,200 mg (08/15/2018), 1,200 mg (09/05/2018), 1,200 mg (10/25/2018), 1,200 mg (11/22/2018), 1,200 mg (12/20/2018), 1,200 mg (01/10/2019), 1,200 mg (01/31/2019), 1,200 mg (02/21/2019), 1,200 mg (03/14/2019), 1,200 mg (04/04/2019), 1,200 mg (04/25/2019), 1,200 mg (05/16/2019), 1,200 mg (06/09/2019)  for chemotherapy treatment.    06/30/2019 -  Chemotherapy   The patient had nivolumab (OPDIVO) 240 mg in sodium chloride 0.9 % 100 mL chemo infusion, 240 mg, Intravenous, Once, 5 of 9  cycles Administration: 240 mg (06/30/2019), 240 mg (07/14/2019), 240 mg (07/28/2019), 240 mg (08/11/2019)  for chemotherapy treatment.     HISTORY OF PRESENTING ILLNESS: Edward Cummings 86 y.o.  male with metastatic transitional carcinoma of the bladder currently on Opdivo is here for follow-up.  In the interim patient was evaluated by GI awaiting upper endoscopy on April 5.  Also met with speech pathology.  Continues to have intermittent difficulty swallowing; however tolerating modified diet.  Otherwise no nausea no vomiting.  No loss of appetite fevers or chills.   Review of Systems  Constitutional: Positive for malaise/fatigue. Negative for chills, [name], hemoptysis, sputum production and wheezing.   Cardiovascular: Negative for chest pain, palpitations, orthopnea and leg swelling.  Gastrointestinal: Negative for abdominal pain, blood in stool, diarrhea, heartburn, melena, nausea and vomiting.  Genitourinary: Negative for dysuria, frequency and urgency.  Musculoskeletal: Positive for back pain. Negative for joint pain.  Skin: Negative.  Negative for itching and rash.  Neurological: Negative for tingling, focal weakness, weakness and headaches.  Endo/Heme/Allergies: Does not bruise/bleed easily.  Psychiatric/Behavioral: Negative for depression. The patient is not nervous/anxious and does not have insomnia.      MEDICAL HISTORY:  Past Medical History:  Diagnosis Date  . Anemia   . Cancer ([place])    bladder  . Chronic kidney disease   . Depression   . Hypertension   . Neuromuscular disorder ([place])    Nerve damage to left face/eye since around 2002.    SURGICAL HISTORY: Past Surgical History:  Procedure Laterality Date  . CYSTOSCOPY W/ RETROGRADES Bilateral 01/25/2018  Procedure: CYSTOSCOPY WITH RETROGRADE  PYELOGRAM;  [name], MD;  [name];  Service: Urology;  Laterality: Bilateral;  . CYSTOSCOPY W/ URETERAL STENT PLACEMENT Bilateral 01/06/2019   Procedure: CYSTOSCOPY WITH RETROGRADE PYELOGRAM/URETERAL STENT REMOVAL;  [name], MD;  [name];  Service: Urology;  Laterality: Bilateral;  . CYSTOSCOPY WITH STENT PLACEMENT Bilateral 01/25/2018   Procedure: CYSTOSCOPY WITH STENT PLACEMENT;  [name], MD;  [name];  Service: Urology;  Laterality: Bilateral;  . DORSAL SLIT N/A 01/06/2019   Procedure: DORSAL SLIT;  [name], MD;  [name];  Service: Urology;  [name];  . EYE SURGERY     Cornea transplants bilaterally & cataract surgery.  . TRANSURETHRAL RESECTION OF BLADDER TUMOR N/A 01/25/2018   Procedure: TRANSURETHRAL RESECTION OF BLADDER TUMOR (TURBT);  [name], MD;  [name];  Service: Urology;  [name];    SOCIAL HISTORY: lives in [place]; with wife; quit smoking 18 years ago; beer every 2 months or so.mechanic/retd.   Social History   Socioeconomic History  . Marital status: Married    Spouse name: [name]  . Number of children: Not on file  . Years of education: Not on file  . Highest education level: Not on file  Occupational History  . Not on file  Tobacco Use  . Smoking status: Former Research scientist (life sciences)  . Smokeless tobacco: Current User    Types: Chew  . Tobacco comment: Stopped approximately 10 years ago.  Substance and Sexual Activity  . Alcohol use: Not Currently    Alcohol/week: 2.0 standard drinks    Types: 2 Cans of beer per week    Comment: Daily  . Drug use: Never  . Sexual activity: Yes    Birth control/protection: None  Other Topics Concern  . Not on file  Social History Narrative  . Not on file   Social Determinants of Health   Financial Resource Strain:   . Difficulty of Paying Living Expenses:   Food Insecurity:    . Worried About Charity fundraiser in the Last Year:   . Arboriculturist in the Last Year:   Transportation Needs:   . Film/video editor (Medical):   Marland Kitchen Lack of Transportation (Non-Medical):   Physical Activity:   . Days of Exercise per Week:   . Minutes of Exercise per Session:   Stress:   . Feeling of Stress :   Social Connections:   . Frequency of Communication with Friends and Family:   . Frequency of Social Gatherings with Friends and Family:   . Attends Religious Services:   . Active Member of Clubs or Organizations:   . Attends Archivist Meetings:   Marland Kitchen Marital Status:   Intimate Partner Violence:   . Fear of Current or Ex-Partner:   . Emotionally Abused:   Marland Kitchen Physically Abused:   . Sexually Abused:     FAMILY HISTORY: Family History  Problem Relation Age of Onset  . Prostate cancer Neg Hx   . Kidney cancer Neg Hx   . Bladder Cancer Neg Hx     ALLERGIES:  has No Known Allergies.  MEDICATIONS:  Current Outpatient Medications  Medication Sig Dispense Refill  . amLODipine (NORVASC) 5 MG tablet Take 1 tablet (5 mg total) by mouth daily. 30 tablet 3  . aspirin EC 81 MG tablet Take 81 mg by mouth daily.     Marland Kitchen docusate  sodium (COLACE) 50 MG capsule Take 50 mg by mouth at bedtime.    . fexofenadine (ALLEGRA) 180 MG tablet Take 180 mg by mouth daily.    . fluticasone (FLONASE) 50 MCG/ACT nasal spray Place 2 sprays into both nostrils daily as needed for allergies or rhinitis.    Marland Kitchen HYDROcodone-acetaminophen (NORCO/VICODIN) 5-325 MG tablet Take 1 tablet by mouth every 8 (eight) hours as needed for moderate pain. 30 tablet 0  . levothyroxine (SYNTHROID) 88 MCG tablet Take 1 tablet (88 mcg total) by mouth daily before breakfast. Empty stomach- 1 hour prior to breakfast. 90 tablet 3  . Multiple Vitamin (MULTIVITAMIN WITH MINERALS) TABS tablet Take 1 tablet by mouth daily.     Marland Kitchen oxybutynin (DITROPAN) 5 MG tablet Take 1 tablet by mouth 3 (three) times daily as  needed.    . prednisoLONE acetate (PRED FORTE) 1 % ophthalmic suspension Place 1 drop into both eyes daily.    . tamsulosin (FLOMAX) 0.4 MG CAPS capsule Take 1 capsule (0.4 mg total) by mouth at bedtime. 90 capsule 1   No current facility-administered medications for this visit.   Facility-Administered Medications Ordered in Other Visits  Medication Dose Route Frequency Provider Last Rate Last Admin  . nivolumab (OPDIVO) 240 mg in sodium chloride 0.9 % 100 mL chemo infusion  240 mg Intravenous Once Charlaine Dalton R, MD          .  PHYSICAL EXAMINATION: ECOG PERFORMANCE STATUS: 1 - Symptomatic but completely ambulatory  Vitals:   08/25/19 0830  BP: (!) 114/58  Pulse: (!) 55  Resp: 20  Temp: (!) 96.1 F (35.6 C)   Filed Weights   08/25/19 0841  Weight: 123 lb 9.6 oz (56.1 kg)    Physical Exam  Constitutional: He is oriented to person, place, and time.  With his wife  Walking himself.  Thin built moderately nourished male patient.  HENT:  Head: Normocephalic and atraumatic.  Mouth/Throat: Oropharynx is clear and moist. No oropharyngeal exudate.  Eyes: Pupils are equal, round, and reactive to light.  Chronic drooping of the left eyelid.  Cardiovascular: Normal rate and regular rhythm.  Pulmonary/Chest: No respiratory distress. He has no wheezes.  Abdominal: Soft. Bowel sounds are normal. He exhibits no distension and no mass. There is no abdominal tenderness. There is no rebound and no guarding.  Musculoskeletal:        General: No tenderness or edema. Normal range of motion.     Cervical back: Normal range of motion and neck supple.  Neurological: He is alert and oriented to person, place, and time.  Skin: Skin is warm.  Psychiatric: Affect normal.     LABORATORY DATA:  I have reviewed the data as listed Lab Results  Component Value Date   WBC 7.0 08/25/2019   HGB 9.4 (L) 08/25/2019   HCT 28.0 (L) 08/25/2019   MCV 97.9 08/25/2019   PLT 197 08/25/2019    Recent Labs    07/28/19 0844 08/11/19 0757 08/25/19 0814  NA 140 140 138  K 4.0 3.9 3.9  CL 109 109 111  CO2 '24 24 22  ' GLUCOSE 120* 119* 120*  BUN 29* 25* 34*  CREATININE 2.50* 2.17* 2.50*  CALCIUM 9.1 9.0 9.0  GFRNONAA 23* 27* 23*  GFRAA 26* 31* 26*  PROT 7.3 6.7 6.9  ALBUMIN 4.1 3.8 4.1  AST '22 21 24  ' ALT '16 12 16  ' ALKPHOS 76 70 72  BILITOT 0.7 0.5 0.8    RADIOGRAPHIC STUDIES: I  have personally reviewed the radiological images as listed and agreed with the findings in the report. DG ESOPHAGUS W SINGLE CM (SOL OR THIN BA)  Result Date: 08/07/2019 CLINICAL DATA:  Dysphagia. EXAM: ESOPHOGRAM/BARIUM SWALLOW TECHNIQUE: Combined double contrast and single contrast examination performed using effervescent crystals, thick barium liquid, and thin barium liquid. FLUOROSCOPY TIME:  Fluoroscopy Time:  0 minutes 30 seconds Radiation Exposure Index (if provided by the fluoroscopic device): 11 mGy COMPARISON:  CT chest 07/25/2019. FINDINGS: Cervical esophagus is widely patent. Barium aspiration noted. Small sliding hiatal hernia with slightly prominent B ring noted. No significant focal esophageal thickening noted. No evidence of esophageal mass. No reflux. Barium tablet was not administered due to the patient's aspiration. IMPRESSION: 1.  Barium aspiration. 2. Small sliding hiatal hernia with slightly prominent B ring noted. No significant focal esophageal thickening. No evidence of esophageal mass. No reflux. Electronically Signed   By: Marcello Moores  Register   On: 08/07/2019 09:55    ASSESSMENT & PLAN:   Cancer of overlapping sites of bladder ([place]) # High-grade transitional cell carcinoma of the bladder metastatic to retroperitoneal lymph node.  Stage IV; FEB 12th, 2021-  CT-chest and pelvis-noncontrast- negative for recurrence; prostatomegaly /thickening at the GE junction  [see below]. chronic thickening of bladder stable.  Currently on opdivo. STABLE.   #Proceed with Opdivo today. Labs  today reviewed;  acceptable for treatment today.    # Iatrogenic hypothyroidism-on Synthroid 88 mcg; STABLE: TSH January 2021-0.3.   # Enlarged prostate-on CT asymptomatic [Dr.Stoiff]; PSA- pending.    # Anemia sec to CKD/ on Iv iron.Hb-9.1; STABLE.   # Dysphagia- intermittent- ? Solids-dg esophagogram-B- ring noted.  S/p GI eval; plan EGD on 4/5th.  # CKD stage IV-GFR 23-STABLE.   # DISPOSITION:  # treatment today. # Follow-up in 2 weeks- -MD labs-cbc/cmp; [name] IV-Dr.B   All questions were answered. The patient knows to call the clinic with any problems, questions or concerns.    Cammie Sickle, MD 08/25/2019 9:26 AM

## 2019-08-25 NOTE — Assessment & Plan Note (Addendum)
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  Stage IV; FEB 12th, 2021-  CT-chest and pelvis-noncontrast- negative for recurrence; prostatomegaly /thickening at the GE junction  [see below]. chronic thickening of bladder stable.  Currently on opdivo. STABLE.   #Proceed with Opdivo today. Labs today reviewed;  acceptable for treatment today.    # Iatrogenic hypothyroidism-on Synthroid 88 mcg; STABLE: TSH January 2021-0.3.   # Enlarged prostate-on CT asymptomatic [Dr.Stoiff]; PSA- pending.    # Anemia sec to CKD/ on Iv iron.Hb-9.1; STABLE.   # Dysphagia- intermittent- ? Solids-dg esophagogram-B- ring noted.  S/p GI eval; plan EGD on 4/5th.  # CKD stage IV-GFR 23-STABLE.   # DISPOSITION:  # treatment today. # Follow-up in 2 weeks- -MD labs-cbc/cmp; [name] IV-Dr.B

## 2019-09-08 ENCOUNTER — Inpatient Hospital Stay: Payer: Medicare HMO

## 2019-09-08 ENCOUNTER — Encounter: Payer: Self-pay | Admitting: Internal Medicine

## 2019-09-08 ENCOUNTER — Inpatient Hospital Stay: Payer: Medicare HMO | Admitting: Internal Medicine

## 2019-09-08 VITALS — BP 113/58 | HR 76 | Temp 96.1°F | Wt 126.0 lb

## 2019-09-08 VITALS — Resp 20

## 2019-09-08 DIAGNOSIS — Z5112 Encounter for antineoplastic immunotherapy: Secondary | ICD-10-CM | POA: Diagnosis not present

## 2019-09-08 DIAGNOSIS — D509 Iron deficiency anemia, unspecified: Secondary | ICD-10-CM

## 2019-09-08 DIAGNOSIS — C678 Malignant neoplasm of overlapping sites of bladder: Secondary | ICD-10-CM

## 2019-09-08 DIAGNOSIS — Z7189 Other specified counseling: Secondary | ICD-10-CM

## 2019-09-08 LAB — CBC WITH DIFFERENTIAL/PLATELET
Abs Immature Granulocytes: 0.01 10*3/uL (ref 0.00–0.07)
Basophils Absolute: 0.1 10*3/uL (ref 0.0–0.1)
Basophils Relative: 1 %
Eosinophils Absolute: 1 10*3/uL — ABNORMAL HIGH (ref 0.0–0.5)
Eosinophils Relative: 15 %
HCT: 26.2 % — ABNORMAL LOW (ref 39.0–52.0)
Hemoglobin: 8.9 g/dL — ABNORMAL LOW (ref 13.0–17.0)
Immature Granulocytes: 0 %
Lymphocytes Relative: 38 %
Lymphs Abs: 2.6 10*3/uL (ref 0.7–4.0)
MCH: 33.5 pg (ref 26.0–34.0)
MCHC: 34 g/dL (ref 30.0–36.0)
MCV: 98.5 fL (ref 80.0–100.0)
Monocytes Absolute: 0.4 10*3/uL (ref 0.1–1.0)
Monocytes Relative: 7 %
Neutro Abs: 2.6 10*3/uL (ref 1.7–7.7)
Neutrophils Relative %: 39 %
Platelets: 207 10*3/uL (ref 150–400)
RBC: 2.66 MIL/uL — ABNORMAL LOW (ref 4.22–5.81)
RDW: 12.4 % (ref 11.5–15.5)
WBC: 6.6 10*3/uL (ref 4.0–10.5)
nRBC: 0 % (ref 0.0–0.2)

## 2019-09-08 LAB — COMPREHENSIVE METABOLIC PANEL
ALT: 15 U/L (ref 0–44)
AST: 23 U/L (ref 15–41)
Albumin: 3.8 g/dL (ref 3.5–5.0)
Alkaline Phosphatase: 72 U/L (ref 38–126)
Anion gap: 7 (ref 5–15)
BUN: 31 mg/dL — ABNORMAL HIGH (ref 8–23)
CO2: 20 mmol/L — ABNORMAL LOW (ref 22–32)
Calcium: 8.8 mg/dL — ABNORMAL LOW (ref 8.9–10.3)
Chloride: 109 mmol/L (ref 98–111)
Creatinine, Ser: 2.45 mg/dL — ABNORMAL HIGH (ref 0.61–1.24)
GFR calc Af Amer: 27 mL/min — ABNORMAL LOW (ref >60)
GFR calc non Af Amer: 23 mL/min — ABNORMAL LOW (ref >60)
Glucose, Bld: 150 mg/dL — ABNORMAL HIGH (ref 70–99)
Potassium: 3.8 mmol/L (ref 3.5–5.1)
Sodium: 136 mmol/L (ref 135–145)
Total Bilirubin: 0.6 mg/dL (ref 0.3–1.2)
Total Protein: 6.7 g/dL (ref 6.5–8.1)

## 2019-09-08 LAB — IRON AND TIBC
Iron: 77 ug/dL (ref 45–182)
Saturation Ratios: 38 % (ref 17.9–39.5)
TIBC: 204 ug/dL — ABNORMAL LOW (ref 250–450)
UIBC: 127 ug/dL

## 2019-09-08 LAB — FERRITIN: Ferritin: 374 ng/mL — ABNORMAL HIGH (ref 24–336)

## 2019-09-08 MED ORDER — SODIUM CHLORIDE 0.9 % IV SOLN
240.0000 mg | Freq: Once | INTRAVENOUS | Status: AC
  Administered 2019-09-08: 240 mg via INTRAVENOUS
  Filled 2019-09-08: qty 24

## 2019-09-08 MED ORDER — SODIUM CHLORIDE 0.9 % IV SOLN
Freq: Once | INTRAVENOUS | Status: AC
  Filled 2019-09-08: qty 250

## 2019-09-08 NOTE — Assessment & Plan Note (Addendum)
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  Stage IV; FEB 12th, 2021-  CT-chest and pelvis-noncontrast- negative for recurrence; prostatomegaly /thickening at the GE junction  [see below]. chronic thickening of bladder stable.  Currently on opdivo. STABLE.   #Proceed with Opdivo today. Labs today reviewed;  acceptable for treatment today.    # Iatrogenic hypothyroidism-on Synthroid 88 mcg; STABLE: TSH January 2021-0.3.   # Enlarged prostate-on CT asymptomatic [Dr.Stoiff]; PSA-0.95-normal.  # Anemia sec to CKD/ on Iv iron.Hb-8.9-STABLE.  Check iron studies next visit.  # Dysphagia- intermittent- ? Solids-dg esophagogram-B- ring noted.  STABLE; awaiting on EGD  # CKD stage IV-GFR 23-STABLE.   # DISPOSITION: add iron studies/ferritin # treatment today. # Follow-up in 2 weeks- -MD labs-cbc/cmp; [name] IV-Dr.B

## 2019-09-08 NOTE — Progress Notes (Signed)
[facility] CONSULT NOTE  Patient Care Team: [name], [name], MD as Consulting Physician (Hematology and Oncology)  CHIEF COMPLAINTS/PURPOSE OF CONSULTATION: Bladder cancer   Oncology History Overview Note  # AUG 2019-TRANSITIONAL CELL BLADDER CA [~ 4cm tumor] s/p cystoscopy [Dr.Stoiff]  with extensive angiolymphatic invasion; [name]; [name]; mediastinal uptake; right pubic rami uptake.  # [name]; Jan 18th 2021- switched to [place]- [pt preference; [name]; [name]; [name]  ;GOALS: palliative  CURRENT/MOST RECENT THERAPY:OPDIVO [C]     Cancer of overlapping sites of bladder ([place])  02/28/2018 - 06/29/2019 Chemotherapy   The patient had atezolizumab (TECENTRIQ) 1,[name], 1,200 mg, Intravenous, Once, [name],200 mg (02/28/2018), 1,200 mg (03/21/2018), 1,200 mg (04/11/2018), 1,200 mg (05/02/2018), 1,200 mg (06/13/2018), 1,200 mg (05/23/2018), 1,200 mg (07/04/2018), 1,200 mg (07/25/2018), 1,200 mg (08/15/2018), 1,200 mg (09/05/2018), 1,200 mg (10/25/2018), 1,200 mg (11/22/2018), 1,200 mg (12/20/2018), 1,200 mg (01/10/2019), 1,200 mg (01/31/2019), 1,200 mg (02/21/2019), 1,200 mg (03/14/2019), 1,200 mg (04/04/2019), 1,200 mg (04/25/2019), 1,200 mg (05/16/2019), 1,200 mg (06/09/2019)  for chemotherapy treatment.    06/30/2019 -  Chemotherapy   The patient had nivolumab (OPDIVO) 240 mg in sodium chloride 0.9 % 100 mL chemo infusion, 240 mg, Intravenous, Once, 6 of 9  cycles Administration: 240 mg (06/30/2019), 240 mg (07/14/2019), 240 mg (07/28/2019), 240 mg (08/11/2019), 240 mg (08/25/2019)  for chemotherapy treatment.     HISTORY OF PRESENTING ILLNESS: Edward Cummings 86 y.o.  male with metastatic transitional carcinoma of the bladder currently on Opdivo is here for follow-up.  Patient continues to have intermittent difficulty swallowing.  Not any worse.  Awaiting endoscopy on April 5.  Is concerned about his wife being currently admitted to hospital.  Otherwise denies any nausea vomiting abdominal pain or diarrhea shortness of breath. No blood in stools no blood in urine.  Review of Systems  Constitutional: Positive for malaise/fatigue. Negative for chills, [name], hemoptysis, sputum production and wheezing.   Cardiovascular: Negative for chest pain, palpitations, orthopnea and leg swelling.  Gastrointestinal: Negative for abdominal pain, blood in stool, diarrhea, heartburn, melena, nausea and vomiting.  Genitourinary: Negative for dysuria, frequency and urgency.  Musculoskeletal: Positive for back pain. Negative for joint pain.  Skin: Negative.  Negative for itching and rash.  Neurological: Negative for tingling, focal weakness, weakness and headaches.  Endo/Heme/Allergies: Does not bruise/bleed easily.  Psychiatric/Behavioral: Negative for depression. The patient is not nervous/anxious and does not have insomnia.      MEDICAL HISTORY:  Past Medical History:  Diagnosis Date  . Anemia   . Cancer ([place])    bladder  . Chronic kidney disease   . Depression   . Hypertension   . Neuromuscular disorder ([place])    Nerve damage to left face/eye since around 2002.    SURGICAL HISTORY: Past Surgical History:  Procedure Laterality Date  . CYSTOSCOPY  W/ RETROGRADES Bilateral 01/25/2018    Procedure: CYSTOSCOPY WITH RETROGRADE PYELOGRAM;  [name], MD;  [name];  Service: Urology;  Laterality: Bilateral;  . CYSTOSCOPY W/ URETERAL STENT PLACEMENT Bilateral 01/06/2019   Procedure: CYSTOSCOPY WITH RETROGRADE PYELOGRAM/URETERAL STENT REMOVAL;  [name], MD;  [name];  Service: Urology;  Laterality: Bilateral;  . CYSTOSCOPY WITH STENT PLACEMENT Bilateral 01/25/2018   Procedure: CYSTOSCOPY WITH STENT PLACEMENT;  [name], MD;  [name];  Service: Urology;  Laterality: Bilateral;  . DORSAL SLIT N/A 01/06/2019   Procedure: DORSAL SLIT;  [name], MD;  [name];  Service: Urology;  [name];  . EYE SURGERY     Cornea transplants bilaterally & cataract surgery.  . TRANSURETHRAL RESECTION OF BLADDER TUMOR N/A 01/25/2018   Procedure: TRANSURETHRAL RESECTION OF BLADDER TUMOR (TURBT);  [name], MD;  [name];  Service: Urology;  [name];    SOCIAL HISTORY: lives in [place]; with wife; quit smoking 18 years ago; beer every 2 months or so.mechanic/retd.   Social History   Socioeconomic History  . Marital status: Married    Spouse name: [name]  . Number of children: Not on file  . Years of education: Not on file  . Highest education level: Not on file  Occupational History  . Not on file  Tobacco Use  . Smoking status: Former Research scientist (life sciences)  . Smokeless tobacco: Current User    Types: Chew  . Tobacco comment: Stopped approximately 10 years ago.  Substance and Sexual Activity  . Alcohol use: Not Currently    Alcohol/week: 2.0 standard drinks    Types: 2 Cans of beer per week    Comment: Daily  . Drug use: Never  . Sexual activity: Yes    Birth control/protection: None  Other Topics Concern  . Not on file  Social History Narrative  . Not on file   Social Determinants of Health   Financial Resource Strain:   . Difficulty of Paying  Living Expenses:   Food Insecurity:   . Worried About Charity fundraiser in the Last Year:   . Arboriculturist in the Last Year:   Transportation Needs:   . Film/video editor (Medical):   Marland Kitchen Lack of Transportation (Non-Medical):   Physical Activity:   . Days of Exercise per Week:   . Minutes of Exercise per Session:   Stress:   . Feeling of Stress :   Social Connections:   . Frequency of Communication with Friends and Family:   . Frequency of Social Gatherings with Friends and Family:   . Attends Religious Services:   . Active Member of Clubs or Organizations:   . Attends Archivist Meetings:   Marland Kitchen Marital Status:   Intimate Partner Violence:   . Fear of Current or Ex-Partner:   . Emotionally Abused:   Marland Kitchen Physically Abused:   . Sexually Abused:     FAMILY HISTORY: Family History  Problem Relation Age of Onset  . Prostate cancer Neg Hx   . Kidney cancer Neg Hx   . Bladder Cancer Neg Hx     ALLERGIES:  has No Known Allergies.  MEDICATIONS:  Current Outpatient Medications  Medication Sig Dispense Refill  . amLODipine (NORVASC) 5 MG tablet Take 1 tablet (5 mg total) by mouth daily. 30 tablet 3  . aspirin EC 81 MG tablet Take 81 mg by mouth daily.     Marland Kitchen  docusate sodium (COLACE) 50 MG capsule Take 50 mg by mouth at bedtime.    . fexofenadine (ALLEGRA) 180 MG tablet Take 180 mg by mouth daily.    . fluticasone (FLONASE) 50 MCG/ACT nasal spray Place 2 sprays into both nostrils daily as needed for allergies or rhinitis.    Marland Kitchen HYDROcodone-acetaminophen (NORCO/VICODIN) 5-325 MG tablet Take 1 tablet by mouth every 8 (eight) hours as needed for moderate pain. 30 tablet 0  . levothyroxine (SYNTHROID) 88 MCG tablet Take 1 tablet (88 mcg total) by mouth daily before breakfast. Empty stomach- 1 hour prior to breakfast. 90 tablet 3  . Multiple Vitamin (MULTIVITAMIN WITH MINERALS) TABS tablet Take 1 tablet by mouth daily.     Marland Kitchen oxybutynin (DITROPAN) 5 MG tablet Take 1 tablet by  mouth 3 (three) times daily as needed.    . prednisoLONE acetate (PRED FORTE) 1 % ophthalmic suspension Place 1 drop into both eyes daily.    . tamsulosin (FLOMAX) 0.4 MG CAPS capsule Take 1 capsule (0.4 mg total) by mouth at bedtime. 90 capsule 1   No current facility-administered medications for this visit.      Marland Kitchen  PHYSICAL EXAMINATION: ECOG PERFORMANCE STATUS: 1 - Symptomatic but completely ambulatory  Vitals:   09/08/19 0837  BP: (!) 113/58  Pulse: 76  Temp: (!) 96.1 F (35.6 C)   Filed Weights   09/08/19 0837  Weight: 126 lb (57.2 kg)    Physical Exam  Constitutional: He is oriented to person, place, and time.  With his wife  Walking himself.  Thin built moderately nourished male patient.  HENT:  Head: Normocephalic and atraumatic.  Mouth/Throat: Oropharynx is clear and moist. No oropharyngeal exudate.  Eyes: Pupils are equal, round, and reactive to light.  Chronic drooping of the left eyelid.  Cardiovascular: Normal rate and regular rhythm.  Pulmonary/Chest: No respiratory distress. He has no wheezes.  Abdominal: Soft. Bowel sounds are normal. He exhibits no distension and no mass. There is no abdominal tenderness. There is no rebound and no guarding.  Musculoskeletal:        General: No tenderness or edema. Normal range of motion.     Cervical back: Normal range of motion and neck supple.  Neurological: He is alert and oriented to person, place, and time.  Skin: Skin is warm.  Psychiatric: Affect normal.     LABORATORY DATA:  I have reviewed the data as listed Lab Results  Component Value Date   WBC 6.6 09/08/2019   HGB 8.9 (L) 09/08/2019   HCT 26.2 (L) 09/08/2019   MCV 98.5 09/08/2019   PLT 207 09/08/2019   Recent Labs    08/11/19 0757 08/25/19 0814 09/08/19 0813  NA 140 138 136  K 3.9 3.9 3.8  CL 109 111 109  CO2 24 22 20*  GLUCOSE 119* 120* 150*  BUN 25* 34* 31*  CREATININE 2.17* 2.50* 2.45*  CALCIUM 9.0 9.0 8.8*  GFRNONAA 27* 23* 23*   GFRAA 31* 26* 27*  PROT 6.7 6.9 6.7  ALBUMIN 3.8 4.1 3.8  AST _0 ALT _1 ALKPHOS 70 72 72  BILITOT 0.5 0.8 0.6    RADIOGRAPHIC STUDIES: I have personally reviewed the radiological images as listed and agreed with the findings in the report. No results found.  ASSESSMENT & PLAN:   Cancer of overlapping sites of bladder ([place]) # High-grade transitional cell carcinoma of the bladder metastatic to retroperitoneal lymph node.  Stage IV; FEB 12th, 2021-  CT-chest and pelvis-noncontrast- negative for recurrence; prostatomegaly /thickening at the GE junction  [see below]. chronic thickening of bladder stable.  Currently on opdivo. STABLE.   #Proceed with Opdivo today. Labs today reviewed;  acceptable for treatment today.    # Iatrogenic hypothyroidism-on Synthroid 88 mcg; STABLE: TSH January 2021-0.3.   # Enlarged prostate-on CT asymptomatic [Dr.Stoiff]; PSA-0.95-normal.  # Anemia sec to CKD/ on Iv iron.Hb-8.9-STABLE.  Check iron studies next visit.  # Dysphagia- intermittent- ? Solids-dg esophagogram-B- ring noted.  STABLE; awaiting on EGD  # CKD stage IV-GFR 23-STABLE.   # DISPOSITION: add iron studies/ferritin # treatment today. # Follow-up in 2 weeks- -MD labs-cbc/cmp; [name] IV-Dr.B   All questions were answered. The patient knows to call the clinic with any problems, questions or concerns.    Cammie Sickle, MD 09/08/2019 11:07 AM

## 2019-09-09 ENCOUNTER — Telehealth: Payer: Self-pay

## 2019-09-09 NOTE — Telephone Encounter (Signed)
Spoke with Dava at [facility] and cancelled procedure per pt request.

## 2019-09-09 NOTE — Telephone Encounter (Signed)
Patient would like to cancel his procedure on 09/14/19

## 2019-09-15 NOTE — Progress Notes (Signed)
Pharmacist Chemotherapy Monitoring - Follow Up Assessment    I verify that I have reviewed each item in the below checklist:  . Regimen for the patient is scheduled for the appropriate day and plan matches scheduled date. Marland Kitchen Appropriate non-routine labs are ordered dependent on drug ordered. . If applicable, additional medications reviewed and ordered per protocol based on lifetime cumulative doses and/or treatment regimen.   Plan for follow-up and/or issues identified: No . I-vent associated with next due treatment: No . MD and/or nursing notified: No  Britteny Fiebelkorn K 09/15/2019 11:42 AM

## 2019-09-16 ENCOUNTER — Ambulatory Visit: Admission: RE | Admit: 2019-09-16 | Payer: Medicare HMO | Source: Home / Self Care | Admitting: Gastroenterology

## 2019-09-16 ENCOUNTER — Encounter: Admission: RE | Payer: Self-pay | Source: Home / Self Care

## 2019-09-16 SURGERY — ESOPHAGOGASTRODUODENOSCOPY (EGD) WITH PROPOFOL
Anesthesia: General

## 2019-09-19 ENCOUNTER — Encounter: Payer: Self-pay | Admitting: Internal Medicine

## 2019-09-19 ENCOUNTER — Other Ambulatory Visit: Payer: Self-pay

## 2019-09-19 MED ORDER — LEVOTHYROXINE SODIUM 88 MCG PO TABS: 88.0000 ug | ORAL_TABLET | Freq: Every day | ORAL | 3 refills | Status: DC

## 2019-09-19 NOTE — Progress Notes (Signed)
Patient needs RF on synthroid 88 mcg. He only has 3 tablets left. V/o to have synthroid 88 mcg to sent to patient's pharmacy.

## 2019-09-22 ENCOUNTER — Encounter: Payer: Self-pay | Admitting: Internal Medicine

## 2019-09-22 ENCOUNTER — Other Ambulatory Visit: Payer: Self-pay

## 2019-09-22 ENCOUNTER — Inpatient Hospital Stay: Payer: Medicare HMO

## 2019-09-22 ENCOUNTER — Inpatient Hospital Stay: Payer: Medicare HMO | Attending: Internal Medicine

## 2019-09-22 ENCOUNTER — Inpatient Hospital Stay: Payer: Medicare HMO | Admitting: Internal Medicine

## 2019-09-22 DIAGNOSIS — N400 Enlarged prostate without lower urinary tract symptoms: Secondary | ICD-10-CM | POA: Insufficient documentation

## 2019-09-22 DIAGNOSIS — Z79899 Other long term (current) drug therapy: Secondary | ICD-10-CM | POA: Diagnosis not present

## 2019-09-22 DIAGNOSIS — R69 Illness, unspecified: Secondary | ICD-10-CM | POA: Diagnosis not present

## 2019-09-22 DIAGNOSIS — Z87891 Personal history of nicotine dependence: Secondary | ICD-10-CM | POA: Diagnosis not present

## 2019-09-22 DIAGNOSIS — R1310 Dysphagia, unspecified: Secondary | ICD-10-CM | POA: Diagnosis not present

## 2019-09-22 DIAGNOSIS — Z7982 Long term (current) use of aspirin: Secondary | ICD-10-CM | POA: Diagnosis not present

## 2019-09-22 DIAGNOSIS — E039 Hypothyroidism, unspecified: Secondary | ICD-10-CM | POA: Diagnosis not present

## 2019-09-22 DIAGNOSIS — F329 Major depressive disorder, single episode, unspecified: Secondary | ICD-10-CM | POA: Insufficient documentation

## 2019-09-22 DIAGNOSIS — N184 Chronic kidney disease, stage 4 (severe): Secondary | ICD-10-CM | POA: Insufficient documentation

## 2019-09-22 DIAGNOSIS — C678 Malignant neoplasm of overlapping sites of bladder: Secondary | ICD-10-CM

## 2019-09-22 DIAGNOSIS — I10 Essential (primary) hypertension: Secondary | ICD-10-CM | POA: Insufficient documentation

## 2019-09-22 DIAGNOSIS — Z5112 Encounter for antineoplastic immunotherapy: Secondary | ICD-10-CM | POA: Diagnosis present

## 2019-09-22 DIAGNOSIS — E032 Hypothyroidism due to medicaments and other exogenous substances: Secondary | ICD-10-CM

## 2019-09-22 DIAGNOSIS — D631 Anemia in chronic kidney disease: Secondary | ICD-10-CM | POA: Insufficient documentation

## 2019-09-22 DIAGNOSIS — Z7189 Other specified counseling: Secondary | ICD-10-CM

## 2019-09-22 LAB — CBC WITH DIFFERENTIAL/PLATELET
Abs Immature Granulocytes: 0.02 10*3/uL (ref 0.00–0.07)
Basophils Absolute: 0.1 10*3/uL (ref 0.0–0.1)
Basophils Relative: 1 %
Eosinophils Absolute: 0.8 10*3/uL — ABNORMAL HIGH (ref 0.0–0.5)
Eosinophils Relative: 14 %
HCT: 28.7 % — ABNORMAL LOW (ref 39.0–52.0)
Hemoglobin: 9.3 g/dL — ABNORMAL LOW (ref 13.0–17.0)
Immature Granulocytes: 0 %
Lymphocytes Relative: 30 %
Lymphs Abs: 1.8 10*3/uL (ref 0.7–4.0)
MCH: 32.4 pg (ref 26.0–34.0)
MCHC: 32.4 g/dL (ref 30.0–36.0)
MCV: 100 fL (ref 80.0–100.0)
Monocytes Absolute: 0.5 10*3/uL (ref 0.1–1.0)
Monocytes Relative: 8 %
Neutro Abs: 2.6 10*3/uL (ref 1.7–7.7)
Neutrophils Relative %: 47 %
Platelets: 198 10*3/uL (ref 150–400)
RBC: 2.87 MIL/uL — ABNORMAL LOW (ref 4.22–5.81)
RDW: 12.5 % (ref 11.5–15.5)
WBC: 5.8 10*3/uL (ref 4.0–10.5)
nRBC: 0 % (ref 0.0–0.2)

## 2019-09-22 LAB — COMPREHENSIVE METABOLIC PANEL
ALT: 14 U/L (ref 0–44)
AST: 22 U/L (ref 15–41)
Albumin: 4.1 g/dL (ref 3.5–5.0)
Alkaline Phosphatase: 73 U/L (ref 38–126)
Anion gap: 7 (ref 5–15)
BUN: 28 mg/dL — ABNORMAL HIGH (ref 8–23)
CO2: 24 mmol/L (ref 22–32)
Calcium: 9.3 mg/dL (ref 8.9–10.3)
Chloride: 109 mmol/L (ref 98–111)
Creatinine, Ser: 2.21 mg/dL — ABNORMAL HIGH (ref 0.61–1.24)
GFR calc Af Amer: 30 mL/min — ABNORMAL LOW (ref >60)
GFR calc non Af Amer: 26 mL/min — ABNORMAL LOW (ref >60)
Glucose, Bld: 148 mg/dL — ABNORMAL HIGH (ref 70–99)
Potassium: 5 mmol/L (ref 3.5–5.1)
Sodium: 140 mmol/L (ref 135–145)
Total Bilirubin: 0.9 mg/dL (ref 0.3–1.2)
Total Protein: 7.1 g/dL (ref 6.5–8.1)

## 2019-09-22 LAB — TSH: TSH: 0.2 u[IU]/mL — ABNORMAL LOW (ref 0.350–4.500)

## 2019-09-22 MED ORDER — SODIUM CHLORIDE 0.9 % IV SOLN
240.0000 mg | Freq: Once | INTRAVENOUS | Status: AC
  Administered 2019-09-22: 240 mg via INTRAVENOUS
  Filled 2019-09-22: qty 24

## 2019-09-22 MED ORDER — SODIUM CHLORIDE 0.9 % IV SOLN
Freq: Once | INTRAVENOUS | Status: AC
  Filled 2019-09-22: qty 250

## 2019-09-22 NOTE — Assessment & Plan Note (Addendum)
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  Stage IV; FEB 12th, 2021-  CT-chest and pelvis-noncontrast- negative for recurrence; prostatomegaly /thickening at the GE junction  [see below]. chronic thickening of bladder stable.  Currently on opdivo. STABLE.   #Proceed with Opdivo today. Labs today reviewed;  acceptable for treatment today.    # Iatrogenic hypothyroidism-on Synthroid 88 mcg; STABLE: TSH January 2021-0.3.   # Enlarged prostate-on CT asymptomatic [Dr.Stoiff]; PSA-0.95-normal.  # Anemia sec to CKD/ on Iv iron.Hb-9; overall STABLE; No IDA. .  # Dysphagia- intermittent- ? Solids-dg esophagogram-B- ring noted.  STABLE; postponed sec to wife's illness. appt appt next week.   # CKD stage IV-GFR 23-STABLE.   # DISPOSITION:  # treatment today. # Follow-up in 2 weeks- -MD labs-cbc/cmp; [name] IV-Dr.B

## 2019-09-22 NOTE — Progress Notes (Signed)
[facility] CONSULT NOTE  Patient Care Team: [name], [name], MD as Consulting Physician (Hematology and Oncology)  CHIEF COMPLAINTS/PURPOSE OF CONSULTATION: Bladder cancer   Oncology History Overview Note  # AUG 2019-TRANSITIONAL CELL BLADDER CA [~ 4cm tumor] s/p cystoscopy [Dr.Stoiff]  with extensive angiolymphatic invasion; [name]; [name]; mediastinal uptake; right pubic rami uptake.  # [name]; Jan 18th 2021- switched to [facility]- [pt preference; [name]; [name]; [name]  ;GOALS: palliative  CURRENT/MOST RECENT THERAPY:OPDIVO [C]     Cancer of overlapping sites of bladder ([place])  02/28/2018 - 06/29/2019 Chemotherapy   The patient had atezolizumab (TECENTRIQ) 1,[name], 1,200 mg, Intravenous, Once, [name],200 mg (02/28/2018), 1,200 mg (03/21/2018), 1,200 mg (04/11/2018), 1,200 mg (05/02/2018), 1,200 mg (06/13/2018), 1,200 mg (05/23/2018), 1,200 mg (07/04/2018), 1,200 mg (07/25/2018), 1,200 mg (08/15/2018), 1,200 mg (09/05/2018), 1,200 mg (10/25/2018), 1,200 mg (11/22/2018), 1,200 mg (12/20/2018), 1,200 mg (01/10/2019), 1,200 mg (01/31/2019), 1,200 mg (02/21/2019), 1,200 mg (03/14/2019), 1,200 mg (04/04/2019), 1,200 mg (04/25/2019), 1,200 mg (05/16/2019), 1,200 mg (06/09/2019)  for chemotherapy treatment.    06/30/2019 -  Chemotherapy   The patient had nivolumab (OPDIVO) 240 mg in sodium chloride 0.9 % 100 mL chemo infusion, 240 mg, Intravenous, Once, 6 of 9  cycles Administration: 240 mg (06/30/2019), 240 mg (07/14/2019), 240 mg (07/28/2019), 240 mg (08/11/2019), 240 mg (08/25/2019), 240 mg (09/08/2019)  for chemotherapy treatment.     HISTORY OF PRESENTING ILLNESS: Edward Cummings 86 y.o.  male with metastatic transitional carcinoma of the bladder currently on Opdivo is here for follow-up.  Patient's EGD that was planned for intermittent dysphagia was postponed because of wife's illness.  Patient denies any otherwise nausea vomiting abdominal pain.  No diarrhea.  Chronic mild shortness of breath.  No blood in stools or black-colored stools no blood in urine.  Review of Systems  Constitutional: Positive for malaise/fatigue. Negative for chills, [name], hemoptysis, sputum production and wheezing.   Cardiovascular: Negative for chest pain, palpitations, orthopnea and leg swelling.  Gastrointestinal: Negative for abdominal pain, blood in stool, diarrhea, heartburn, melena, nausea and vomiting.  Genitourinary: Negative for dysuria, frequency and urgency.  Musculoskeletal: Positive for back pain. Negative for joint pain.  Skin: Negative.  Negative for itching and rash.  Neurological: Negative for tingling, focal weakness, weakness and headaches.  Endo/Heme/Allergies: Does not bruise/bleed easily.  Psychiatric/Behavioral: Negative for depression. The patient is not nervous/anxious and does not have insomnia.      MEDICAL HISTORY:  Past Medical History:  Diagnosis Date  . Anemia   . Cancer ([place])    bladder  . Chronic kidney disease   . Depression   . Hypertension   . Neuromuscular disorder ([place])    Nerve damage to left face/eye since around 2002.    SURGICAL HISTORY: Past Surgical History:  Procedure Laterality Date  . CYSTOSCOPY W/ RETROGRADES  Bilateral 01/25/2018   Procedure:  CYSTOSCOPY WITH RETROGRADE PYELOGRAM;  [name], MD;  [name];  Service: Urology;  Laterality: Bilateral;  . CYSTOSCOPY W/ URETERAL STENT PLACEMENT Bilateral 01/06/2019   Procedure: CYSTOSCOPY WITH RETROGRADE PYELOGRAM/URETERAL STENT REMOVAL;  [name], MD;  [name];  Service: Urology;  Laterality: Bilateral;  . CYSTOSCOPY WITH STENT PLACEMENT Bilateral 01/25/2018   Procedure: CYSTOSCOPY WITH STENT PLACEMENT;  [name], MD;  [name];  Service: Urology;  Laterality: Bilateral;  . DORSAL SLIT N/A 01/06/2019   Procedure: DORSAL SLIT;  [name], MD;  [name];  Service: Urology;  [name];  . EYE SURGERY     Cornea transplants bilaterally & cataract surgery.  . TRANSURETHRAL RESECTION OF BLADDER TUMOR N/A 01/25/2018   Procedure: TRANSURETHRAL RESECTION OF BLADDER TUMOR (TURBT);  [name], MD;  [name];  Service: Urology;  [name];    SOCIAL HISTORY: lives in [place]; with wife; quit smoking 18 years ago; beer every 2 months or so.mechanic/retd.   Social History   Socioeconomic History  . Marital status: Married    Spouse name: [name]  . Number of children: Not on file  . Years of education: Not on file  . Highest education level: Not on file  Occupational History  . Not on file  Tobacco Use  . Smoking status: Former Research scientist (life sciences)  . Smokeless tobacco: Current User    Types: Chew  . Tobacco comment: Stopped approximately 10 years ago.  Substance and Sexual Activity  . Alcohol use: Not Currently    Alcohol/week: 2.0 standard drinks    Types: 2 Cans of beer per week    Comment: Daily  . Drug use: Never  . Sexual activity: Yes    Birth control/protection: None  Other Topics Concern  . Not on file  Social History Narrative  . Not on file   Social Determinants of Health   Financial Resource Strain:   . Difficulty of Paying Living  Expenses:   Food Insecurity:   . Worried About Charity fundraiser in the Last Year:   . Arboriculturist in the Last Year:   Transportation Needs:   . Film/video editor (Medical):   Marland Kitchen Lack of Transportation (Non-Medical):   Physical Activity:   . Days of Exercise per Week:   . Minutes of Exercise per Session:   Stress:   . Feeling of Stress :   Social Connections:   . Frequency of Communication with Friends and Family:   . Frequency of Social Gatherings with Friends and Family:   . Attends Religious Services:   . Active Member of Clubs or Organizations:   . Attends Archivist Meetings:   Marland Kitchen Marital Status:   Intimate Partner Violence:   . Fear of Current or Ex-Partner:   . Emotionally Abused:   Marland Kitchen Physically Abused:   . Sexually Abused:     FAMILY HISTORY: Family History  Problem Relation Age of Onset  . Prostate cancer Neg Hx   . Kidney cancer Neg Hx   . Bladder Cancer Neg Hx     ALLERGIES:  has No Known Allergies.  MEDICATIONS:  Current Outpatient Medications  Medication Sig Dispense Refill  . amLODipine (NORVASC) 5 MG tablet Take 1 tablet (5 mg total) by mouth daily. 30 tablet 3  . aspirin EC 81 MG tablet Take 81 mg by mouth daily.     Marland Kitchen  docusate sodium (COLACE) 50 MG capsule Take 50 mg by mouth at bedtime.    . fexofenadine (ALLEGRA) 180 MG tablet Take 180 mg by mouth daily.    . fluticasone (FLONASE) 50 MCG/ACT nasal spray Place 2 sprays into both nostrils daily as needed for allergies or rhinitis.    Marland Kitchen HYDROcodone-acetaminophen (NORCO/VICODIN) 5-325 MG tablet Take 1 tablet by mouth every 8 (eight) hours as needed for moderate pain. 30 tablet 0  . levothyroxine (SYNTHROID) 88 MCG tablet Take 1 tablet (88 mcg total) by mouth daily before breakfast. Empty stomach- 1 hour prior to breakfast. 90 tablet 3  . Multiple Vitamin (MULTIVITAMIN WITH MINERALS) TABS tablet Take 1 tablet by mouth daily.     Marland Kitchen oxybutynin (DITROPAN) 5 MG tablet Take 1 tablet by mouth  3 (three) times daily as needed.    . prednisoLONE acetate (PRED FORTE) 1 % ophthalmic suspension Place 1 drop into both eyes daily.    . tamsulosin (FLOMAX) 0.4 MG CAPS capsule Take 1 capsule (0.4 mg total) by mouth at bedtime. 90 capsule 1   No current facility-administered medications for this visit.      Marland Kitchen  PHYSICAL EXAMINATION: ECOG PERFORMANCE STATUS: 1 - Symptomatic but completely ambulatory  Vitals:   09/22/19 0934  BP: 115/61  Pulse: 71  Temp: (!) 97 F (36.1 C)   Filed Weights   09/22/19 0934  Weight: 124 lb (56.2 kg)    Physical Exam  Constitutional: He is oriented to person, place, and time.  With his wife  Walking himself.  Thin built moderately nourished male patient.  HENT:  Head: Normocephalic and atraumatic.  Mouth/Throat: Oropharynx is clear and moist. No oropharyngeal exudate.  Eyes: Pupils are equal, round, and reactive to light.  Chronic drooping of the left eyelid.  Cardiovascular: Normal rate and regular rhythm.  Pulmonary/Chest: No respiratory distress. He has no wheezes.  Abdominal: Soft. Bowel sounds are normal. He exhibits no distension and no mass. There is no abdominal tenderness. There is no rebound and no guarding.  Musculoskeletal:        General: No tenderness or edema. Normal range of motion.     Cervical back: Normal range of motion and neck supple.  Neurological: He is alert and oriented to person, place, and time.  Skin: Skin is warm.  Psychiatric: Affect normal.     LABORATORY DATA:  I have reviewed the data as listed Lab Results  Component Value Date   WBC 5.8 09/22/2019   HGB 9.3 (L) 09/22/2019   HCT 28.7 (L) 09/22/2019   MCV 100.0 09/22/2019   PLT 198 09/22/2019   Recent Labs    08/25/19 0814 09/08/19 0813 09/22/19 0901  NA 138 136 140  K 3.9 3.8 5.0  CL 111 109 109  CO2 22 20* 24  GLUCOSE 120* 150* 148*  BUN 34* 31* 28*  CREATININE 2.50* 2.45* 2.21*  CALCIUM 9.0 8.8* 9.3  GFRNONAA 23* 23* 26*  GFRAA 26* 27*  30*  PROT 6.9 6.7 7.1  ALBUMIN 4.1 3.8 4.1  AST _0 ALT _1 ALKPHOS 72 72 73  BILITOT 0.8 0.6 0.9    RADIOGRAPHIC STUDIES: I have personally reviewed the radiological images as listed and agreed with the findings in the report. No results found.  ASSESSMENT & PLAN:   Cancer of overlapping sites of bladder ([place]) # High-grade transitional cell carcinoma of the bladder metastatic to retroperitoneal lymph node.  Stage IV; FEB 12th, 2021-  CT-chest and pelvis-noncontrast- negative for recurrence; prostatomegaly /thickening at the GE junction  [see below]. chronic thickening of bladder stable.  Currently on opdivo. STABLE.   #Proceed with Opdivo today. Labs today reviewed;  acceptable for treatment today.    # Iatrogenic hypothyroidism-on Synthroid 88 mcg; STABLE: TSH January 2021-0.3.   # Enlarged prostate-on CT asymptomatic [Dr.Stoiff]; PSA-0.95-normal.  # Anemia sec to CKD/ on Iv iron.Hb-9; overall STABLE; No IDA. .  # Dysphagia- intermittent- ? Solids-dg esophagogram-B- ring noted.  STABLE; postponed sec to wife's illness. appt appt next week.   # CKD stage IV-GFR 23-STABLE.   # DISPOSITION:  # treatment today. # Follow-up in 2 weeks- -MD labs-cbc/cmp; [name] IV-Dr.B   All questions were answered. The patient knows to call the clinic with any problems, questions or concerns.    Cammie Sickle, MD 09/22/2019 10:01 AM

## 2019-09-23 DIAGNOSIS — C679 Malignant neoplasm of bladder, unspecified: Secondary | ICD-10-CM | POA: Diagnosis not present

## 2019-09-23 DIAGNOSIS — R6251 Failure to thrive (child): Secondary | ICD-10-CM | POA: Diagnosis not present

## 2019-09-28 ENCOUNTER — Encounter: Payer: Self-pay | Admitting: Internal Medicine

## 2019-09-29 ENCOUNTER — Encounter: Payer: Self-pay | Admitting: Gastroenterology

## 2019-09-29 NOTE — Progress Notes (Signed)
Pharmacist Chemotherapy Monitoring - Follow Up Assessment    I verify that I have reviewed each item in the below checklist:  . Regimen for the patient is scheduled for the appropriate day and plan matches scheduled date. Marland Kitchen Appropriate non-routine labs are ordered dependent on drug ordered. . If applicable, additional medications reviewed and ordered per protocol based on lifetime cumulative doses and/or treatment regimen.   Plan for follow-up and/or issues identified: No . I-vent associated with next due treatment: No . MD and/or nursing notified: No  Edward Cummings 09/29/2019 11:47 AM2

## 2019-09-30 ENCOUNTER — Encounter: Payer: Self-pay | Admitting: Gastroenterology

## 2019-09-30 ENCOUNTER — Telehealth (INDEPENDENT_AMBULATORY_CARE_PROVIDER_SITE_OTHER): Payer: Medicare HMO | Admitting: Gastroenterology

## 2019-09-30 ENCOUNTER — Other Ambulatory Visit: Payer: Self-pay

## 2019-09-30 DIAGNOSIS — R1310 Dysphagia, unspecified: Secondary | ICD-10-CM | POA: Diagnosis not present

## 2019-09-30 NOTE — Progress Notes (Signed)
Edward Antigua, MD [address]  [place]  [place], [place] [address]  Main: [phone]  Fax: [phone]   Primary Care Physician: [name], MD  Virtual Visit via Telephone Note  I connected with patient on 09/30/19 at 10:45 AM EDT by telephone and verified that I am speaking with the correct person using two identifiers.   I discussed the limitations, risks, security and privacy concerns of performing an evaluation and management service by telephone and the availability of in person appointments. I also discussed with the patient that there may be a patient responsible charge related to this service. The patient expressed understanding and agreed to proceed.  Location of Patient: Home Location of Provider: Home Persons involved: Patient and provider only during the visit (nursing staff and front desk staff was involved in communicating with the patient prior to the appointment, reviewing medications and checking them in)   History of Present Illness: Chief complaint: Dysphagia  HPI: Edward Cummings is a 86 y.o. male here for follow-up of dysphagia and GE junction thickening on imaging.  EGD was scheduled for 4/6 but patient canceled the procedure due to family issues.   Previous history: Patient with history of bladder cancer with metastasis to retroperitoneal lymph nodes, stage IV, referred to [place] for dysphagia.  CT scan showed thickening at the GE junction and esophagram showed sliding hiatal hernia with slightly prominent B ring with no significant focal esophageal thickening.  Patient was evaluated by speech pathology and they recommend further GI evaluation as well.  Current Outpatient Medications  Medication Sig Dispense Refill  . amLODipine (NORVASC) 5 MG tablet Take 1 tablet (5 mg total) by mouth daily. 30 tablet 3  . aspirin EC 81 MG tablet Take 81 mg by mouth daily.     Marland Kitchen docusate sodium (COLACE) 50 MG capsule Take 50 mg by mouth at bedtime.    .  fexofenadine (ALLEGRA) 180 MG tablet Take 180 mg by mouth daily.    . fluticasone (FLONASE) 50 MCG/ACT nasal spray Place 2 sprays into both nostrils daily as needed for allergies or rhinitis.    Marland Kitchen HYDROcodone-acetaminophen (NORCO/VICODIN) 5-325 MG tablet Take 1 tablet by mouth every 8 (eight) hours as needed for moderate pain. 30 tablet 0  . levothyroxine (SYNTHROID) 88 MCG tablet Take 1 tablet (88 mcg total) by mouth daily before breakfast. Empty stomach- 1 hour prior to breakfast. 90 tablet 3  . Multiple Vitamin (MULTIVITAMIN WITH MINERALS) TABS tablet Take 1 tablet by mouth daily.     Marland Kitchen oxybutynin (DITROPAN) 5 MG tablet Take 1 tablet by mouth 3 (three) times daily as needed.    . prednisoLONE acetate (PRED FORTE) 1 % ophthalmic suspension Place 1 drop into both eyes daily.    . tamsulosin (FLOMAX) 0.4 MG CAPS capsule Take 1 capsule (0.4 mg total) by mouth at bedtime. 90 capsule 1   No current facility-administered medications for this visit.    Allergies as of 09/30/2019  . (No Known Allergies)    Review of Systems:    All systems reviewed and negative except where noted in HPI.   Observations/Objective:  Labs: CMP     Component Value Date/Time   NA 140 09/22/2019 0901   K 5.0 09/22/2019 0901   CL 109 09/22/2019 0901   CO2 24 09/22/2019 0901   GLUCOSE 148 (H) 09/22/2019 0901   BUN 28 (H) 09/22/2019 0901   CREATININE 2.21 (H) 09/22/2019 0901   CALCIUM 9.3 09/22/2019 0901  PROT 7.1 09/22/2019 0901   ALBUMIN 4.1 09/22/2019 0901   AST 22 09/22/2019 0901   ALT 14 09/22/2019 0901   ALKPHOS 73 09/22/2019 0901   BILITOT 0.9 09/22/2019 0901   GFRNONAA 26 (L) 09/22/2019 0901   GFRAA 30 (L) 09/22/2019 0901   Lab Results  Component Value Date   WBC 5.8 09/22/2019   HGB 9.3 (L) 09/22/2019   HCT 28.7 (L) 09/22/2019   MCV 100.0 09/22/2019   PLT 198 09/22/2019    Imaging Studies: No results found.  Assessment and Plan:   Edward Cummings is a 86 y.o. y/o male here for  follow-up of dysphagia and GE junction thickening  Assessment and Plan: Patient is agreeable to rescheduling his EGD that he previously canceled  I have discussed alternative options, risks & benefits,  which include, but are not limited to, bleeding, infection, perforation,respiratory complication & drug reaction.  The patient agrees with this plan & written consent will be obtained.    Follow Up Instructions:    I discussed the assessment and treatment plan with the patient. The patient was provided an opportunity to ask questions and all were answered. The patient agreed with the plan and demonstrated an understanding of the instructions.   The patient was advised to call back or seek an in-person evaluation if the symptoms worsen or if the condition fails to improve as anticipated.  I provided 5 minutes of non-face-to-face time during this encounter. Additional time was spent in reviewing patient's chart, placing orders etc.   Virgel Manifold, MD  Speech recognition software was used to dictate this note.

## 2019-10-06 ENCOUNTER — Inpatient Hospital Stay: Payer: Medicare HMO

## 2019-10-06 ENCOUNTER — Inpatient Hospital Stay: Payer: Medicare HMO | Admitting: Internal Medicine

## 2019-10-13 ENCOUNTER — Telehealth: Payer: Self-pay

## 2019-10-13 NOTE — Telephone Encounter (Signed)
Patient's son-Mike was called and he wanted to know why his dad was going to be scoped. I told him that Dr. Bonna Gains had seen a GE junction thickening noted on CT and that his dad was reporting new dysphagia, indicate further evaluation with endoscopy. Ronalee Belts was also told that EGD is to rule out strictures, narrowing or malignancy at the site. Ronalee Belts understood and had no further questions.

## 2019-10-13 NOTE — Telephone Encounter (Signed)
Patients son called office with question regarding colonoscopy.  LVM returning son's call asking him to to call back.  His father is not scheduled for a colonoscopy-its an EGD with Dr. Bonna Gains.  Asked him to call back to address his questions and concerns.  Thanks,  Sharyn Lull, CAM

## 2019-10-14 DIAGNOSIS — Z03818 Encounter for observation for suspected exposure to other biological agents ruled out: Secondary | ICD-10-CM | POA: Diagnosis not present

## 2019-10-14 DIAGNOSIS — Z20828 Contact with and (suspected) exposure to other viral communicable diseases: Secondary | ICD-10-CM | POA: Diagnosis not present

## 2019-10-15 NOTE — Telephone Encounter (Signed)
Error

## 2019-10-16 ENCOUNTER — Telehealth: Payer: Self-pay | Admitting: Internal Medicine

## 2019-10-16 NOTE — Telephone Encounter (Signed)
I spoke to patient's son re: his parents follow up. Plan as below.   C- Recommend follow up on 5/17[name]- MD; labs-cbc/cmp;Opdivo.

## 2019-10-20 NOTE — Progress Notes (Signed)
Pharmacist Chemotherapy Monitoring - Follow Up Assessment    I verify that I have reviewed each item in the below checklist:  . Regimen for the patient is scheduled for the appropriate day and plan matches scheduled date. Marland Kitchen Appropriate non-routine labs are ordered dependent on drug ordered. . If applicable, additional medications reviewed and ordered per protocol based on lifetime cumulative doses and/or treatment regimen.   Plan for follow-up and/or issues identified: No . I-vent associated with next due treatment: No . MD and/or nursing notified: No  Lilygrace Rodick K 10/20/2019 9:06 AM

## 2019-10-22 ENCOUNTER — Ambulatory Visit: Payer: Medicare HMO | Admitting: Internal Medicine

## 2019-10-23 ENCOUNTER — Other Ambulatory Visit
Admission: RE | Admit: 2019-10-23 | Discharge: 2019-10-23 | Disposition: A | Discharge status: Home / Self Care | Payer: Medicare HMO | Source: Ambulatory Visit | Attending: Gastroenterology | Admitting: Gastroenterology

## 2019-10-23 ENCOUNTER — Telehealth: Payer: Self-pay | Admitting: *Deleted

## 2019-10-23 ENCOUNTER — Other Ambulatory Visit: Payer: Self-pay

## 2019-10-23 DIAGNOSIS — Z20822 Contact with and (suspected) exposure to covid-19: Secondary | ICD-10-CM | POA: Diagnosis not present

## 2019-10-23 DIAGNOSIS — Z01812 Encounter for preprocedural laboratory examination: Secondary | ICD-10-CM | POA: Insufficient documentation

## 2019-10-23 LAB — SARS CORONAVIRUS 2 (TAT 6-24 HRS): SARS Coronavirus 2: NEGATIVE

## 2019-10-23 NOTE — Telephone Encounter (Signed)
Son spoke to [employer], NP relating to patient's wife's care. Son also discussed concerns re: his father (Brent's care) during this samephone conversation. Pt has endooscopy planned for next Tuesday. Pt to have covid testing today. Treatment is scheduled on Monday with Dr. Rogue Bussing. Son voiced concerns that his father would not be able to make this apt due to endooscopy prep.  Spoke with Dr. Jacinto Reap - ok to move apts out 1 week to 5/24. New apts given to patient's son.

## 2019-10-24 ENCOUNTER — Other Ambulatory Visit: Payer: Medicare HMO

## 2019-10-27 ENCOUNTER — Inpatient Hospital Stay: Payer: Medicare HMO

## 2019-10-27 ENCOUNTER — Telehealth: Payer: Self-pay

## 2019-10-27 ENCOUNTER — Inpatient Hospital Stay: Payer: Medicare HMO | Admitting: Internal Medicine

## 2019-10-27 NOTE — Telephone Encounter (Signed)
Patient has requested to cancel his EGD for tomorrow with Dr. Bonna Gains due to his wife's health status.  She has been admitted to hospice services.  Trish in Endo notified.  Referral updated.  Thanks,  [place], [place]

## 2019-10-27 NOTE — Progress Notes (Signed)
Pharmacist Chemotherapy Monitoring - Follow Up Assessment    I verify that I have reviewed each item in the below checklist:  . Regimen for the patient is scheduled for the appropriate day and plan matches scheduled date. Marland Kitchen Appropriate non-routine labs are ordered dependent on drug ordered. . If applicable, additional medications reviewed and ordered per protocol based on lifetime cumulative doses and/or treatment regimen.   Plan for follow-up and/or issues identified: No . I-vent associated with next due treatment: No . MD and/or nursing notified: No  Caleigh Rabelo K 10/27/2019 8:12 AM

## 2019-10-28 ENCOUNTER — Ambulatory Visit: Admission: RE | Admit: 2019-10-28 | Payer: Medicare HMO | Source: Home / Self Care | Admitting: Gastroenterology

## 2019-10-28 ENCOUNTER — Encounter: Admission: RE | Payer: Self-pay | Source: Home / Self Care

## 2019-10-28 SURGERY — ESOPHAGOGASTRODUODENOSCOPY (EGD) WITH PROPOFOL
Anesthesia: General

## 2019-11-03 ENCOUNTER — Telehealth: Payer: Self-pay | Admitting: Internal Medicine

## 2019-11-03 ENCOUNTER — Other Ambulatory Visit: Payer: Self-pay

## 2019-11-03 ENCOUNTER — Inpatient Hospital Stay: Payer: Medicare HMO | Attending: Internal Medicine

## 2019-11-03 ENCOUNTER — Inpatient Hospital Stay (HOSPITAL_BASED_OUTPATIENT_CLINIC_OR_DEPARTMENT_OTHER): Payer: Medicare HMO | Admitting: Internal Medicine

## 2019-11-03 ENCOUNTER — Encounter: Payer: Self-pay | Admitting: Internal Medicine

## 2019-11-03 ENCOUNTER — Inpatient Hospital Stay: Payer: Medicare HMO

## 2019-11-03 ENCOUNTER — Other Ambulatory Visit: Payer: Self-pay | Admitting: *Deleted

## 2019-11-03 DIAGNOSIS — C772 Secondary and unspecified malignant neoplasm of intra-abdominal lymph nodes: Secondary | ICD-10-CM | POA: Diagnosis present

## 2019-11-03 DIAGNOSIS — Z7982 Long term (current) use of aspirin: Secondary | ICD-10-CM | POA: Diagnosis not present

## 2019-11-03 DIAGNOSIS — D631 Anemia in chronic kidney disease: Secondary | ICD-10-CM | POA: Insufficient documentation

## 2019-11-03 DIAGNOSIS — Z79899 Other long term (current) drug therapy: Secondary | ICD-10-CM | POA: Insufficient documentation

## 2019-11-03 DIAGNOSIS — Z87891 Personal history of nicotine dependence: Secondary | ICD-10-CM | POA: Insufficient documentation

## 2019-11-03 DIAGNOSIS — N400 Enlarged prostate without lower urinary tract symptoms: Secondary | ICD-10-CM | POA: Insufficient documentation

## 2019-11-03 DIAGNOSIS — D509 Iron deficiency anemia, unspecified: Secondary | ICD-10-CM

## 2019-11-03 DIAGNOSIS — F329 Major depressive disorder, single episode, unspecified: Secondary | ICD-10-CM | POA: Diagnosis not present

## 2019-11-03 DIAGNOSIS — E039 Hypothyroidism, unspecified: Secondary | ICD-10-CM | POA: Insufficient documentation

## 2019-11-03 DIAGNOSIS — R69 Illness, unspecified: Secondary | ICD-10-CM | POA: Diagnosis not present

## 2019-11-03 DIAGNOSIS — C678 Malignant neoplasm of overlapping sites of bladder: Secondary | ICD-10-CM | POA: Diagnosis not present

## 2019-11-03 DIAGNOSIS — Z7189 Other specified counseling: Secondary | ICD-10-CM

## 2019-11-03 DIAGNOSIS — R1310 Dysphagia, unspecified: Secondary | ICD-10-CM | POA: Insufficient documentation

## 2019-11-03 DIAGNOSIS — Z5112 Encounter for antineoplastic immunotherapy: Secondary | ICD-10-CM | POA: Insufficient documentation

## 2019-11-03 DIAGNOSIS — I10 Essential (primary) hypertension: Secondary | ICD-10-CM | POA: Insufficient documentation

## 2019-11-03 DIAGNOSIS — N184 Chronic kidney disease, stage 4 (severe): Secondary | ICD-10-CM | POA: Insufficient documentation

## 2019-11-03 LAB — COMPREHENSIVE METABOLIC PANEL
ALT: 16 U/L (ref 0–44)
AST: 21 U/L (ref 15–41)
Albumin: 4.1 g/dL (ref 3.5–5.0)
Alkaline Phosphatase: 63 U/L (ref 38–126)
Anion gap: 7 (ref 5–15)
BUN: 39 mg/dL — ABNORMAL HIGH (ref 8–23)
CO2: 23 mmol/L (ref 22–32)
Calcium: 9.1 mg/dL (ref 8.9–10.3)
Chloride: 111 mmol/L (ref 98–111)
Creatinine, Ser: 2.19 mg/dL — ABNORMAL HIGH (ref 0.61–1.24)
GFR calc Af Amer: 31 mL/min — ABNORMAL LOW (ref >60)
GFR calc non Af Amer: 26 mL/min — ABNORMAL LOW (ref >60)
Glucose, Bld: 141 mg/dL — ABNORMAL HIGH (ref 70–99)
Potassium: 4.1 mmol/L (ref 3.5–5.1)
Sodium: 141 mmol/L (ref 135–145)
Total Bilirubin: 0.6 mg/dL (ref 0.3–1.2)
Total Protein: 7 g/dL (ref 6.5–8.1)

## 2019-11-03 LAB — CBC WITH DIFFERENTIAL/PLATELET
Abs Immature Granulocytes: 0.01 10*3/uL (ref 0.00–0.07)
Basophils Absolute: 0.1 10*3/uL (ref 0.0–0.1)
Basophils Relative: 1 %
Eosinophils Absolute: 1.1 10*3/uL — ABNORMAL HIGH (ref 0.0–0.5)
Eosinophils Relative: 17 %
HCT: 26.9 % — ABNORMAL LOW (ref 39.0–52.0)
Hemoglobin: 9 g/dL — ABNORMAL LOW (ref 13.0–17.0)
Immature Granulocytes: 0 %
Lymphocytes Relative: 38 %
Lymphs Abs: 2.3 10*3/uL (ref 0.7–4.0)
MCH: 33.1 pg (ref 26.0–34.0)
MCHC: 33.5 g/dL (ref 30.0–36.0)
MCV: 98.9 fL (ref 80.0–100.0)
Monocytes Absolute: 0.4 10*3/uL (ref 0.1–1.0)
Monocytes Relative: 7 %
Neutro Abs: 2.3 10*3/uL (ref 1.7–7.7)
Neutrophils Relative %: 37 %
Platelets: 206 10*3/uL (ref 150–400)
RBC: 2.72 MIL/uL — ABNORMAL LOW (ref 4.22–5.81)
RDW: 12.7 % (ref 11.5–15.5)
WBC: 6.2 10*3/uL (ref 4.0–10.5)
nRBC: 0 % (ref 0.0–0.2)

## 2019-11-03 MED ORDER — SODIUM CHLORIDE 0.9 % IV SOLN
240.0000 mg | Freq: Once | INTRAVENOUS | Status: AC
  Administered 2019-11-03: 240 mg via INTRAVENOUS
  Filled 2019-11-03: qty 24

## 2019-11-03 MED ORDER — SODIUM CHLORIDE 0.9 % IV SOLN
Freq: Once | INTRAVENOUS | Status: AC
  Filled 2019-11-03: qty 250

## 2019-11-03 NOTE — Progress Notes (Signed)
[name], [name], [name], [name]; [name]; [name]; mediastinal uptake; [name]; [name]; [name]; [name]; [name]  ;GOALS: palliative  CURRENT/MOST RECENT THERAPY:OPDIVO [C]     Cancer of overlapping sites of bladder ([place])  02/28/2018 - 06/29/2019 Chemotherapy   The patient had atezolizumab (TECENTRIQ) 1,[name], 1,200 mg, Intravenous, Once, [name],200 mg (02/28/2018), 1,200 mg (03/21/2018), 1,200 mg (04/11/2018), 1,200 mg (05/02/2018), 1,200 mg (06/13/2018), 1,200 mg (05/23/2018), 1,200 mg (07/04/2018), 1,200 mg (07/25/2018), 1,200 mg (08/15/2018), 1,200 mg (09/05/2018), 1,200 mg (10/25/2018), 1,200 mg (11/22/2018), 1,200 mg (12/20/2018), 1,200 mg (01/10/2019), 1,200 mg (01/31/2019), 1,200 mg (02/21/2019), 1,200 mg (03/14/2019), 1,200 mg (04/04/2019), 1,200 mg (04/25/2019), 1,200 mg (05/16/2019), 1,200 mg (06/09/2019)  for chemotherapy treatment.    06/30/2019 -  Chemotherapy   The patient had nivolumab (OPDIVO) 240 mg in sodium chloride 0.9 %  100 mL chemo infusion, 240 mg, Intravenous, Once, 7 of 9 cycles Administration: 240 mg (06/30/2019), 240 mg (07/14/2019), 240 mg (07/28/2019), 240 mg (08/11/2019), 240 mg (08/25/2019), 240 mg (09/08/2019), 240 mg (09/22/2019)  for chemotherapy treatment.     HISTORY OF PRESENTING ILLNESS: Edward Cummings 86 y.o.  male with metastatic transitional carcinoma of the bladder currently on Opdivo is here for follow-up.  Patient continues to lose weight.  Lost 3 pounds since last visit.  Patient is under stress because of his wife's poor health.  Currently on hospice.  Continues to have intermittent dysphagia; [name], [name], hemoptysis, sputum production and wheezing.   Cardiovascular: Negative for chest pain, palpitations, orthopnea and leg swelling.  Gastrointestinal: Negative for abdominal pain, blood in stool, diarrhea, heartburn, melena, nausea and vomiting.  Genitourinary: Negative for dysuria, frequency and urgency.  Musculoskeletal: Positive for back pain. Negative for joint pain.  Skin: Negative.  Negative for itching and rash.  Neurological: Negative for tingling, focal weakness, weakness and headaches.  Endo/Heme/Allergies: Does not bruise/bleed easily.  Psychiatric/Behavioral: Negative for depression. The patient is not nervous/anxious and does not have insomnia.      MEDICAL HISTORY:  Past Medical History:  Diagnosis Date  . Anemia   . Cancer ([place])    bladder  . Chronic kidney disease   . Depression   . Hypertension   . Neuromuscular disorder ([place])    Nerve  damage to left face/eye since around 2002.    SURGICAL HISTORY: Past  Surgical History:  Procedure Laterality Date  . CYSTOSCOPY W/ RETROGRADES Bilateral 01/25/2018   Procedure: CYSTOSCOPY WITH RETROGRADE PYELOGRAM;  [name], MD;  [name];  Service: Urology;  Laterality: Bilateral;  . CYSTOSCOPY W/ URETERAL STENT PLACEMENT Bilateral 01/06/2019   Procedure: CYSTOSCOPY WITH RETROGRADE PYELOGRAM/URETERAL STENT REMOVAL;  [name], MD;  [name];  Service: Urology;  Laterality: Bilateral;  . CYSTOSCOPY WITH STENT PLACEMENT Bilateral 01/25/2018   Procedure: CYSTOSCOPY WITH STENT PLACEMENT;  [name], MD;  [name];  Service: Urology;  Laterality: Bilateral;  . DORSAL SLIT N/A 01/06/2019   Procedure: DORSAL SLIT;  [name], MD;  [name];  Service: Urology;  [name];  . EYE SURGERY     Cornea transplants bilaterally & cataract surgery.  . TRANSURETHRAL RESECTION OF BLADDER TUMOR N/A 01/25/2018   Procedure: TRANSURETHRAL RESECTION OF BLADDER TUMOR (TURBT);  [name], MD;  [name];  Service: Urology;  [name];    [name]; with wife; quit smoking 18 years ago; beer every 2 months or so.mechanic/retd.   Social History   Socioeconomic History  . Marital status: Married    Spouse name: [name]  . Number of children: Not on file  . Years of education: Not on file  . Highest education level: Not on file  Occupational History  . Not on file  Tobacco Use  . Smoking status: Former Research scientist (life sciences)  . Smokeless tobacco: Current User    Types: Chew  . Tobacco comment: Stopped approximately 10 years ago.  Substance and Sexual Activity  . Alcohol use: Not Currently    Alcohol/week: 2.0 standard drinks    Types: 2 Cans of beer per week    Comment: Daily  . Drug use: Never  . Sexual activity: Yes    Birth control/protection: None  Other Topics Concern  . Not on file  Social History Narrative  . Not on  file   Social Determinants of Health   Financial Resource Strain:   . Difficulty of Paying Living Expenses:   Food Insecurity:   . Worried About Charity fundraiser in the Last Year:   . Arboriculturist in the Last Year:   Transportation Needs:   . Film/video editor (Medical):   Marland Kitchen Lack of Transportation (Non-Medical):   Physical Activity:   . Days of Exercise per Week:   . Minutes of Exercise per Session:   Stress:   . Feeling of Stress :   Social Connections:   . Frequency of Communication with Friends and Family:   . Frequency of Social Gatherings with Friends and Family:   . Attends Religious Services:   . Active Member of Clubs or Organizations:   . Attends Archivist Meetings:   Marland Kitchen Marital Status:   Intimate Partner Violence:   . Fear of Current or Ex-Partner:   . Emotionally Abused:   Marland Kitchen Physically Abused:   . Sexually Abused:     FAMILY HISTORY: Family History  Problem Relation Age of Onset  . Prostate cancer Neg Hx   . Kidney cancer Neg Hx   . Bladder Cancer Neg Hx     ALLERGIES:  has No Known Allergies.  MEDICATIONS:  Current Outpatient Medications  Medication Sig Dispense Refill  . amLODipine (NORVASC) 5 MG tablet Take 1 tablet (  5 mg total) by mouth daily. 30 tablet 3  . aspirin EC 81 MG tablet Take 81 mg by mouth daily.     Marland Kitchen docusate sodium (COLACE) 50 MG capsule Take 50 mg by mouth at bedtime.    . fexofenadine (ALLEGRA) 180 MG tablet Take 180 mg by mouth daily.    . fluticasone (FLONASE) 50 MCG/ACT nasal spray Place 2 sprays into both nostrils daily as needed for allergies or rhinitis.    Marland Kitchen levothyroxine (SYNTHROID) 88 MCG tablet Take 1 tablet (88 mcg total) by mouth daily before breakfast. Empty stomach- 1 hour prior to breakfast. 90 tablet 3  . Multiple Vitamin (MULTIVITAMIN WITH MINERALS) TABS tablet Take 1 tablet by mouth daily.     Marland Kitchen oxybutynin (DITROPAN) 5 MG tablet Take 1 tablet by mouth 3 (three) times daily as needed.    .  prednisoLONE acetate (PRED FORTE) 1 % ophthalmic suspension Place 1 drop into both eyes daily.    . tamsulosin (FLOMAX) 0.4 MG CAPS capsule Take 1 capsule (0.4 mg total) by mouth at bedtime. 90 capsule 1  . HYDROcodone-acetaminophen (NORCO/VICODIN) 5-325 MG tablet Take 1 tablet by mouth every 8 (eight) hours as needed for moderate pain. (Patient not taking: Reported on 11/03/2019) 30 tablet 0   No current facility-administered medications for this visit.      Marland Kitchen  PHYSICAL EXAMINATION: ECOG PERFORMANCE STATUS: 1 - Symptomatic but completely ambulatory  Vitals:   11/03/19 0939  BP: (!) 127/51  Pulse: 88  Resp: 18  Temp: 97.9 F (36.6 C)  SpO2: 95%   Filed Weights   11/03/19 0939  Weight: 121 lb 6.4 oz (55.1 kg)    Physical Exam  Constitutional: He is oriented to person, place, and time.  With his wife  Walking himself.  Thin built moderately nourished male patient.  HENT:  Head: Normocephalic and atraumatic.  Mouth/Throat: Oropharynx is clear and moist. No oropharyngeal exudate.  Eyes: Pupils are equal, round, and reactive to light.  Chronic drooping of the left eyelid.  Cardiovascular: Normal rate and regular rhythm.  Pulmonary/Chest: No respiratory distress. He has no wheezes.  Abdominal: Soft. Bowel sounds are normal. He exhibits no distension and no mass. There is no abdominal tenderness. There is no rebound and no guarding.  Musculoskeletal:        General: No tenderness or edema. Normal range of motion.     Cervical back: Normal range of motion and neck supple.  Neurological: He is alert and oriented to person, place, and time.  Skin: Skin is warm.  Psychiatric: Affect normal.     LABORATORY DATA:  I have reviewed the data as listed Lab Results  Component Value Date   WBC 6.2 11/03/2019   HGB 9.0 (L) 11/03/2019   HCT 26.9 (L) 11/03/2019   MCV 98.9 11/03/2019   PLT 206 11/03/2019   Recent Labs    09/08/19 0813 09/22/19 0901 11/03/19 0827  NA 136 140 141   K 3.8 5.0 4.1  CL 109 109 111  CO2 20* 24 23  GLUCOSE 150* 148* 141*  BUN 31* 28* 39*  CREATININE 2.45* 2.21* 2.19*  CALCIUM 8.8* 9.3 9.1  GFRNONAA 23* 26* 26*  GFRAA 27* 30* 31*  PROT 6.7 7.1 7.0  ALBUMIN 3.8 4.1 4.1  AST '23 22 21  ' ALT '15 14 16  ' ALKPHOS 72 73 63  BILITOT 0.6 0.9 0.6    RADIOGRAPHIC STUDIES: I have personally reviewed the radiological images as listed and agreed with  the findings in the report. No results found.  ASSESSMENT & PLAN:   Cancer of overlapping sites of bladder ([place]) # High-grade transitional cell carcinoma of the bladder metastatic to retroperitoneal lymph node.  Stage IV; FEB 12th, 2021-  CT-chest and pelvis-noncontrast- negative for recurrence; prostatomegaly /thickening at the GE junction  [see below]. chronic thickening of bladder stable.  Currently on opdivo. STABLE.   #Proceed with Opdivo today. Labs today reviewed;  acceptable for treatment today. Will order scans at next visit.    # Iatrogenic hypothyroidism-on Synthroid 88 mcg; stable TSH January 2021-0.3.   # Enlarged prostate-on CT asymptomatic [Dr.Stoiff]; PSA-0.95-normal.  Stable  # Anemia sec to CKD/ on Iv iron.Hb-9; overall stable recheck iron studies at next visit.  # Dysphagia- intermittent- ? Solids-dg esophagogram-B- ring noted.  STABLE; again reminded to make the GI apt.  # weight loss- see above; recommend protein shakes/boost.  # CKD stage IV-GFR 26=STABLE.   # DISPOSITION:  # treatment today. # Follow-up in 2 weeks- -MD labs-cbc/cmp;iron studies/ferritin [name] IV-Dr.B   All questions were answered. The patient knows to call the clinic with any problems, questions or concerns.    Cammie Sickle, MD 11/03/2019 10:11 AM

## 2019-11-03 NOTE — Telephone Encounter (Signed)
On 5/24-spoke to patient's son Ronalee Belts regarding continued weight loss; intermittent dysphagia.  Recommend follow-up with GI/upper endoscopy [this is currently on hold because of patient's wife's poor health/hospice].  Recommend son to call GI office regarding appointment.   FYI-Dr.Tahiliani.

## 2019-11-03 NOTE — Progress Notes (Signed)
Pt very concerned about wife, states she has hospice coming to the home.  Pt states no appetite.  Tries to drink a supplemental drink sometimes.Pt states "no energy, weakness".

## 2019-11-03 NOTE — Assessment & Plan Note (Addendum)
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  Stage IV; FEB 12th, 2021-  CT-chest and pelvis-noncontrast- negative for recurrence; prostatomegaly /thickening at the GE junction  [see below]. chronic thickening of bladder stable.  Currently on opdivo. STABLE.   #Proceed with Opdivo today. Labs today reviewed;  acceptable for treatment today. Will order scans at next visit.    # Iatrogenic hypothyroidism-on Synthroid 88 mcg; stable TSH January 2021-0.3.   # Enlarged prostate-on CT asymptomatic [Dr.Stoiff]; PSA-0.95-normal.  Stable  # Anemia sec to CKD/ on Iv iron.Hb-9; overall stable recheck iron studies at next visit.  # Dysphagia- intermittent- ? Solids-dg esophagogram-B- ring noted.  STABLE; again reminded to make the GI apt.  # weight loss- see above; recommend protein shakes/boost.  # CKD stage IV-GFR 26=STABLE.   # DISPOSITION:  # treatment today. # Follow-up in 2 weeks- -MD labs-cbc/cmp;iron studies/ferritin [name] IV-Dr.B

## 2019-11-04 ENCOUNTER — Telehealth: Payer: Self-pay

## 2019-11-04 ENCOUNTER — Other Ambulatory Visit: Payer: Self-pay

## 2019-11-04 DIAGNOSIS — R1310 Dysphagia, unspecified: Secondary | ICD-10-CM

## 2019-11-04 NOTE — Telephone Encounter (Signed)
Called Ronalee Belts to let him know that his dad's EGD will be scheduled for 11/12/2019 and that his COVID-19 will be due on 11/07/2019. Ronalee Belts was given instructions over the phone of what his dad was needing to do. Ronalee Belts understood and had no further questions. Ronalee Belts had no further questions.

## 2019-11-04 NOTE — Telephone Encounter (Signed)
I received a message from the front office to call patients son Legrand Como back to re-schedule his fathers colonoscopy.  When I returned the phone call to Legrand Como, he inquired about the office visit scheduled for tomorrow as well as rescheduling the EGD not colonoscopy. He would like to know if tomorrows appt is needed.  I explained to him that I am unsure of the answers and I will ask his fathers nurse to call him back to discuss.  Marcell Anger phone number is  [phone] Or [phone]  Sent [facility] due to tomorrows appt.   Thanks,  [place], [place]

## 2019-11-04 NOTE — Telephone Encounter (Signed)
Spoke to patient's son-Edward Cummings and told him that Dr. Bonna Gains wanted to see his father to discuss his symptoms. Edward Cummings stated that he had too much going on with his dad and mom that he would like to proceed with his dad's EGD first and if something was found then he would schedule an appointment with Dr. Bonna Gains. But that at this time he would like to wait.

## 2019-11-04 NOTE — Telephone Encounter (Signed)
Dr. Bonna Gains would like to see this patient since Dr. Rogue Bussing is referring him for intermittent dysphagia and weight loss.  Traci was able to speak to patient's son-Mike and he was informed of the appointment. Patient will come in tomorrow to see Dr. Bonna Gains.

## 2019-11-05 ENCOUNTER — Ambulatory Visit: Payer: Medicare HMO | Admitting: Gastroenterology

## 2019-11-07 ENCOUNTER — Other Ambulatory Visit
Admission: RE | Admit: 2019-11-07 | Discharge: 2019-11-07 | Disposition: A | Discharge status: Home / Self Care | Payer: Medicare HMO | Source: Ambulatory Visit | Attending: Gastroenterology | Admitting: Gastroenterology

## 2019-11-07 ENCOUNTER — Other Ambulatory Visit: Payer: Self-pay

## 2019-11-07 DIAGNOSIS — Z20822 Contact with and (suspected) exposure to covid-19: Secondary | ICD-10-CM | POA: Diagnosis not present

## 2019-11-07 DIAGNOSIS — Z01812 Encounter for preprocedural laboratory examination: Secondary | ICD-10-CM | POA: Diagnosis not present

## 2019-11-08 LAB — SARS CORONAVIRUS 2 (TAT 6-24 HRS): SARS Coronavirus 2: NEGATIVE

## 2019-11-12 ENCOUNTER — Encounter: Payer: Self-pay | Admitting: Gastroenterology

## 2019-11-12 ENCOUNTER — Other Ambulatory Visit: Payer: Self-pay

## 2019-11-12 ENCOUNTER — Encounter
Admission: RE | Disposition: A | Discharge status: Home / Self Care | Payer: Self-pay | Source: Home / Self Care | Attending: Gastroenterology

## 2019-11-12 ENCOUNTER — Ambulatory Visit: Payer: Medicare HMO | Admitting: Certified Registered Nurse Anesthetist

## 2019-11-12 ENCOUNTER — Ambulatory Visit
Admission: RE | Admit: 2019-11-12 | Discharge: 2019-11-12 | Disposition: A | Discharge status: Home / Self Care | Payer: Medicare HMO | Attending: Gastroenterology | Admitting: Gastroenterology

## 2019-11-12 DIAGNOSIS — K259 Gastric ulcer, unspecified as acute or chronic, without hemorrhage or perforation: Secondary | ICD-10-CM

## 2019-11-12 DIAGNOSIS — K2950 Unspecified chronic gastritis without bleeding: Secondary | ICD-10-CM | POA: Diagnosis not present

## 2019-11-12 DIAGNOSIS — N189 Chronic kidney disease, unspecified: Secondary | ICD-10-CM | POA: Diagnosis not present

## 2019-11-12 DIAGNOSIS — Z79890 Hormone replacement therapy (postmenopausal): Secondary | ICD-10-CM | POA: Diagnosis not present

## 2019-11-12 DIAGNOSIS — D631 Anemia in chronic kidney disease: Secondary | ICD-10-CM | POA: Diagnosis not present

## 2019-11-12 DIAGNOSIS — N183 Chronic kidney disease, stage 3 unspecified: Secondary | ICD-10-CM | POA: Diagnosis not present

## 2019-11-12 DIAGNOSIS — Z7982 Long term (current) use of aspirin: Secondary | ICD-10-CM | POA: Insufficient documentation

## 2019-11-12 DIAGNOSIS — I129 Hypertensive chronic kidney disease with stage 1 through stage 4 chronic kidney disease, or unspecified chronic kidney disease: Secondary | ICD-10-CM | POA: Diagnosis not present

## 2019-11-12 DIAGNOSIS — R1310 Dysphagia, unspecified: Secondary | ICD-10-CM

## 2019-11-12 DIAGNOSIS — K3189 Other diseases of stomach and duodenum: Secondary | ICD-10-CM | POA: Diagnosis not present

## 2019-11-12 DIAGNOSIS — R69 Illness, unspecified: Secondary | ICD-10-CM | POA: Diagnosis not present

## 2019-11-12 DIAGNOSIS — Z87891 Personal history of nicotine dependence: Secondary | ICD-10-CM | POA: Diagnosis not present

## 2019-11-12 DIAGNOSIS — Z79899 Other long term (current) drug therapy: Secondary | ICD-10-CM | POA: Insufficient documentation

## 2019-11-12 DIAGNOSIS — F329 Major depressive disorder, single episode, unspecified: Secondary | ICD-10-CM | POA: Insufficient documentation

## 2019-11-12 DIAGNOSIS — K228 Other specified diseases of esophagus: Secondary | ICD-10-CM | POA: Diagnosis not present

## 2019-11-12 DIAGNOSIS — K219 Gastro-esophageal reflux disease without esophagitis: Secondary | ICD-10-CM | POA: Diagnosis not present

## 2019-11-12 DIAGNOSIS — D649 Anemia, unspecified: Secondary | ICD-10-CM | POA: Diagnosis not present

## 2019-11-12 HISTORY — PX: ESOPHAGOGASTRODUODENOSCOPY (EGD) WITH PROPOFOL: SHX5813

## 2019-11-12 SURGERY — ESOPHAGOGASTRODUODENOSCOPY (EGD) WITH PROPOFOL
Anesthesia: General

## 2019-11-12 MED ORDER — PROPOFOL 500 MG/50ML IV EMUL
INTRAVENOUS | Status: DC | PRN
  Administered 2019-11-12: 100 ug/kg/min via INTRAVENOUS

## 2019-11-12 MED ORDER — GLYCOPYRROLATE 0.2 MG/ML IJ SOLN
INTRAMUSCULAR | Status: DC | PRN
  Administered 2019-11-12: .2 mg via INTRAVENOUS

## 2019-11-12 MED ORDER — SODIUM CHLORIDE 0.9 % IV SOLN: INTRAVENOUS | Status: DC

## 2019-11-12 MED ORDER — PROPOFOL 10 MG/ML IV BOLUS
INTRAVENOUS | Status: DC | PRN
  Administered 2019-11-12: 10 mg via INTRAVENOUS
  Administered 2019-11-12: 30 mg via INTRAVENOUS

## 2019-11-12 MED ORDER — LIDOCAINE HCL (CARDIAC) PF 100 MG/5ML IV SOSY
PREFILLED_SYRINGE | INTRAVENOUS | Status: DC | PRN
  Administered 2019-11-12: 100 mg via INTRAVENOUS

## 2019-11-12 NOTE — Transfer of Care (Signed)
Immediate Anesthesia Transfer of Care Note  Patient: Edward Cummings  Procedure(s) Performed: ESOPHAGOGASTRODUODENOSCOPY (EGD) WITH PROPOFOL (N/A )  Patient Location: PACU  Anesthesia Type:General  Level of Consciousness: drowsy and patient cooperative  Airway & Oxygen Therapy: Patient Spontanous Breathing and Patient connected to nasal cannula oxygen  Post-op Assessment: Report given to RN and Post -op Vital signs reviewed and stable  Post vital signs: Reviewed and stable  Last Vitals:  Vitals Value Taken Time  BP 112/55 11/12/19 1006  Temp 36.2 C 11/12/19 1006  Pulse 64 11/12/19 1007  Resp 14 11/12/19 1007  SpO2 100 % 11/12/19 1007  Vitals shown include unvalidated device data.  Last Pain:  Vitals:   11/12/19 1006  TempSrc:   PainSc: Asleep         Complications: No apparent anesthesia complications

## 2019-11-12 NOTE — Op Note (Signed)
[facility] Gastroenterology Patient Name: Edward Cummings Procedure Date: 11/12/2019 9:39 AM MRN: [id number] Account #: [id number] Date of Birth: 07-18-1932 Admit Type: Outpatient Age: 86 Room: [facility] ENDO ROOM 3 Gender: Male Note Status: Finalized Procedure:             Upper GI endoscopy Indications:           Dysphagia Providers:             Sufyan Meidinger B. Bonna Gains MD, MD Referring MD:          [name], MD (Referring MD) Medicines:             Monitored Anesthesia Care Complications:         No immediate complications. Procedure:             Pre-Anesthesia Assessment:                        - Prior to the procedure, a History and Physical was                         performed, and patient medications, allergies and                         sensitivities were reviewed. The patient's tolerance                         of previous anesthesia was reviewed.                        - The risks and benefits of the procedure and the                         sedation options and risks were discussed with the                         patient. All questions were answered and informed                         consent was obtained.                        - Patient identification and proposed procedure were                         verified prior to the procedure by the physician, the                         nurse, the anesthesiologist, the anesthetist and the                         technician. The procedure was verified in the                         procedure room.                        - ASA Grade Assessment: III - A patient with severe                         systemic disease.  After obtaining informed consent, the endoscope was                         passed under direct vision. Throughout the procedure,                         the patient's blood pressure, pulse, and oxygen                         saturations were monitored continuously. The Endoscope                          was introduced through the mouth, and advanced to the                         second part of duodenum. The upper GI endoscopy was                         accomplished with ease. The patient tolerated the                         procedure well. Findings:      Mild mucosal changes characterized by thickened folds were found at the       gastroesophageal junction. Biopsies were taken with a cold forceps for       histology. Biopsies were obtained from the proximal and distal esophagus       with cold forceps for histology of suspected eosinophilic esophagitis.      Mild resistance noted while passing the scope past the vocal cords into       the upper esophagus. Possible cricopharyngeal bar      Patchy mildly erythematous mucosa without bleeding was found in the       gastric antrum. Biopsies were taken with a cold forceps for histology.       Biopsies were obtained in the gastric body, at the incisura and in the       gastric antrum with cold forceps for histology.      Patchy mild mucosal changes characterized by erosion and scalloping were       found in the gastric body and in the gastric antrum. Biopsies were taken       with a cold forceps for histology.      The duodenal bulb, second portion of the duodenum and examined duodenum       were normal. Impression:            - Thickened folds mucosa in the esophagus. Biopsied.                        - Mild resistance noted while passing the scope past                         the vocal cords into the upper esophagus. Possible                         cricopharyngeal bar                        - Erythematous mucosa in the antrum. Biopsied.                        -  Eroded and scalloped mucosa in the gastric body and                         antrum. Biopsied.                        - Normal duodenal bulb, second portion of the duodenum                         and examined duodenum.                        - Biopsies were  obtained in the gastric body, at the                         incisura and in the gastric antrum. Recommendation:        - Await pathology results.                        - Discharge patient to home (with escort).                        - Advance diet as tolerated.                        - Continue present medications.                        - Patient has a contact number available for                         emergencies. The signs and symptoms of potential                         delayed complications were discussed with the patient.                         Return to normal activities tomorrow. Written                         discharge instructions were provided to the patient.                        - Discharge patient to home (with escort).                        - The findings and recommendations were discussed with                         the patient.                        - The findings and recommendations were discussed with                         the patient's family. Procedure Code(s):     --- Professional ---                        [phone], Esophagogastroduodenoscopy, flexible,  transoral; with biopsy, single or multiple Diagnosis Code(s):     --- Professional ---                        K22.8, Other specified diseases of esophagus                        K25.9, Gastric ulcer, unspecified as acute or chronic,                         without hemorrhage or perforation                        K31.89, Other diseases of stomach and duodenum                        R13.10, Dysphagia, unspecified CPT copyright 2019 American Medical Association. All rights reserved. The codes documented in this report are preliminary and upon coder review may  be revised to meet current compliance requirements.  Vonda Antigua, MD Margretta Sidle B. Bonna Gains MD, MD 11/12/2019 10:12:08 AM This report has been signed electronically. Number of Addenda: 0 Note Initiated On: 11/12/2019 9:39  AM Estimated Blood Loss:  Estimated blood loss: none.      [facility]

## 2019-11-12 NOTE — Anesthesia Postprocedure Evaluation (Signed)
Anesthesia Post Note  Patient: Edward Cummings  Procedure(s) Performed: ESOPHAGOGASTRODUODENOSCOPY (EGD) WITH PROPOFOL (N/A )  Patient location during evaluation: Endoscopy Anesthesia Type: General Level of consciousness: awake and alert Pain management: pain level controlled Vital Signs Assessment: post-procedure vital signs reviewed and stable Respiratory status: spontaneous breathing, nonlabored ventilation, respiratory function stable and patient connected to nasal cannula oxygen Cardiovascular status: blood pressure returned to baseline and stable Postop Assessment: no apparent nausea or vomiting Anesthetic complications: no     Last Vitals:  Vitals:   11/12/19 1026 11/12/19 1036  BP: 134/62 (!) 141/62  Pulse: 74 73  Resp: 19 13  Temp:    SpO2: 100% 99%    Last Pain:  Vitals:   11/12/19 1036  TempSrc:   PainSc: 0-No pain                 Arita Miss

## 2019-11-12 NOTE — H&P (Signed)
Vonda Antigua, MD [address] [place], [place], [place], [address] [address], [place], [place], [place], [address] Phone: [phone]  Fax: [phone]  Primary Care Physician:  [name], MD   Pre-Procedure History & Physical: HPI:  Edward Cummings is a 86 y.o. male is here for an EGD.   Past Medical History:  Diagnosis Date  . Anemia   . Cancer ([place])    bladder  . Chronic kidney disease   . Depression   . Hypertension   . Neuromuscular disorder ([place])    Nerve damage to left face/eye since around 2002.    Past Surgical History:  Procedure Laterality Date  . CYSTOSCOPY W/ RETROGRADES Bilateral 01/25/2018   Procedure: CYSTOSCOPY WITH RETROGRADE PYELOGRAM;  [name], MD;  [name];  Service: Urology;  Laterality: Bilateral;  . CYSTOSCOPY W/ URETERAL STENT PLACEMENT Bilateral 01/06/2019   Procedure: CYSTOSCOPY WITH RETROGRADE PYELOGRAM/URETERAL STENT REMOVAL;  [name], MD;  [name];  Service: Urology;  Laterality: Bilateral;  . CYSTOSCOPY WITH STENT PLACEMENT Bilateral 01/25/2018   Procedure: CYSTOSCOPY WITH STENT PLACEMENT;  [name], MD;  [name];  Service: Urology;  Laterality: Bilateral;  . DORSAL SLIT N/A 01/06/2019   Procedure: DORSAL SLIT;  [name], MD;  [name];  Service: Urology;  [name];  . EYE SURGERY     Cornea transplants bilaterally & cataract surgery.  . TRANSURETHRAL RESECTION OF BLADDER TUMOR N/A 01/25/2018   Procedure: TRANSURETHRAL RESECTION OF BLADDER TUMOR (TURBT);  [name], MD;  [name];  Service: Urology;  [name];    Prior to Admission medications   Medication Sig Start Date End Date Taking? Authorizing Provider  amLODipine (NORVASC) 5 MG tablet Take 1 tablet (5 mg total) by mouth daily. 04/11/18  Yes Cammie Sickle, MD  aspirin EC 81 MG tablet Take 81 mg by mouth daily.     Yes [provider]  docusate sodium (COLACE) 50 MG capsule Take 50 mg by mouth at bedtime.   Yes [provider]  fexofenadine (ALLEGRA) 180 MG tablet Take 180 mg by mouth daily.   Yes [provider]  levothyroxine (SYNTHROID) 88 MCG tablet Take 1 tablet (88 mcg total) by mouth daily before breakfast. Empty stomach- 1 hour prior to breakfast. 09/19/19  Yes Cammie Sickle, MD  prednisoLONE acetate (PRED FORTE) 1 % ophthalmic suspension Place 1 drop into both eyes daily.   Yes [provider]  tamsulosin (FLOMAX) 0.4 MG CAPS capsule Take 1 capsule (0.4 mg total) by mouth at bedtime. 09/05/18  Yes Cammie Sickle, MD  fluticasone (FLONASE) 50 MCG/ACT nasal spray Place 2 sprays into both nostrils daily as needed for allergies or rhinitis.    [provider]  HYDROcodone-acetaminophen (NORCO/VICODIN) 5-325 MG tablet Take 1 tablet by mouth every 8 (eight) hours as needed for moderate pain. Patient not taking: Reported on 11/03/2019 01/10/19   Cammie Sickle, MD  Multiple Vitamin (MULTIVITAMIN WITH MINERALS) TABS tablet Take 1 tablet by mouth daily.     [provider]  oxybutynin (DITROPAN) 5 MG tablet Take 1 tablet by mouth 3 (three) times daily as needed. 02/22/19   [provider]    Allergies as of 11/05/2019  . (No Known Allergies)    Family History  Problem Relation Age of Onset  . Prostate cancer Neg Hx   . Kidney cancer Neg Hx   .  Bladder Cancer Neg Hx     Social History   Socioeconomic History  . Marital status: Married    Spouse name: [name]  . Number of children: Not on file  . Years of education: Not on file  . Highest education level: Not on file  Occupational History  . Not on file  Tobacco Use  . Smoking status: Former Smoker    Types: Cigarettes  . Smokeless tobacco: Current User    Types: Chew  . Tobacco comment: Stopped approximately 10 years ago.  Substance and Sexual Activity  .  Alcohol use: Not Currently    Alcohol/week: 2.0 standard drinks    Types: 2 Cans of beer per week    Comment: Daily  . Drug use: Never  . Sexual activity: Yes    Birth control/protection: None  Other Topics Concern  . Not on file  Social History Narrative  . Not on file   Social Determinants of Health   Financial Resource Strain:   . Difficulty of Paying Living Expenses:   Food Insecurity:   . Worried About Charity fundraiser in the Last Year:   . Arboriculturist in the Last Year:   Transportation Needs:   . Film/video editor (Medical):   Marland Kitchen Lack of Transportation (Non-Medical):   Physical Activity:   . Days of Exercise per Week:   . Minutes of Exercise per Session:   Stress:   . Feeling of Stress :   Social Connections:   . Frequency of Communication with Friends and Family:   . Frequency of Social Gatherings with Friends and Family:   . Attends Religious Services:   . Active Member of Clubs or Organizations:   . Attends Archivist Meetings:   Marland Kitchen Marital Status:   Intimate Partner Violence:   . Fear of Current or Ex-Partner:   . Emotionally Abused:   Marland Kitchen Physically Abused:   . Sexually Abused:     Review of Systems: See HPI, otherwise negative ROS  Physical Exam: BP (!) 142/59   Pulse (!) 52   Temp (!) 97.3 F (36.3 C) (Oral)   Resp 16   Ht 5\' 8"  (1.727 m)   Wt 54.4 kg   SpO2 100%   BMI 18.25 kg/m  General:   Alert,  pleasant and cooperative in NAD Head:  Normocephalic and atraumatic. Neck:  Supple; no masses or thyromegaly. Lungs:  Clear throughout to auscultation, normal respiratory effort.    Heart:  +S1, +S2, Regular rate and rhythm, No edema. Abdomen:  Soft, nontender and nondistended. Normal bowel sounds, without guarding, and without rebound.   Neurologic:  Alert and  oriented x4;  grossly normal neurologically.  Impression/Plan: Edward Cummings is here for an EGD for dysphagia, abnormal imaging  Risks, benefits, limitations, and  alternatives regarding the procedure have been reviewed with the patient.  Questions have been answered.  All parties agreeable.   Virgel Manifold, MD  11/12/2019, 9:35 AM

## 2019-11-12 NOTE — Anesthesia Preprocedure Evaluation (Addendum)
Anesthesia Evaluation  Patient identified by MRN, date of birth, ID band Patient awake    Reviewed: Allergy & Precautions, NPO status , Patient's Chart, lab work & pertinent test results  History of Anesthesia Complications Negative for: history of anesthetic complications  Airway Mallampati: II  TM Distance: >3 FB Neck ROM: Full    Dental  (+) Edentulous Upper, Edentulous Lower   Pulmonary neg pulmonary ROS, neg sleep apnea, neg COPD, Patient abstained from smoking.Not current smoker, former smoker,    Pulmonary exam normal breath sounds clear to auscultation       Cardiovascular Exercise Tolerance: Good METShypertension, (-) CAD and (-) Past MI Normal cardiovascular exam(-) dysrhythmias  Rhythm:Regular Rate:Normal - Systolic murmurs    Neuro/Psych PSYCHIATRIC DISORDERS Depression  Neuromuscular disease    GI/Hepatic Neg liver ROS, neg GERD  ,dysphagia   Endo/Other  negative endocrine ROSneg diabetes  Renal/GU Renal InsufficiencyRenal diseasenegative Renal ROS     Musculoskeletal negative musculoskeletal ROS (+)   Abdominal Normal abdominal exam  (+)   Peds negative pediatric ROS (+)  Hematology  (+) anemia ,   Anesthesia Other Findings Past Medical History: No date: Anemia No date: Cancer ([place])     Comment:  bladder No date: Chronic kidney disease No date: Depression No date: Hypertension No date: Neuromuscular disorder ([place])     Comment:  Nerve damage to left face/eye since around 2002.  Reproductive/Obstetrics                             Anesthesia Physical  Anesthesia Plan  ASA: III  Anesthesia Plan: General   Post-op Pain Management:    Induction: Intravenous  PONV Risk Score and Plan: 2 and Ondansetron, Propofol infusion and TIVA  Airway Management Planned: Natural Airway and Nasal Cannula  Additional Equipment: None  Intra-op Plan:   Post-operative Plan:  Extubation in OR  Informed Consent: I have reviewed the patients History and Physical, chart, labs and discussed the procedure including the risks, benefits and alternatives for the proposed anesthesia with the patient or authorized representative who has indicated his/her understanding and acceptance.     Dental advisory given  Plan Discussed with: CRNA and Surgeon  Anesthesia Plan Comments: (Discussed risks of anesthesia with patient, including possibility of difficulty with spontaneous ventilation under anesthesia necessitating airway intervention, PONV, and rare risks such as cardiac or respiratory or neurological events. Patient understands.)       Anesthesia Quick Evaluation

## 2019-11-13 ENCOUNTER — Telehealth: Payer: Self-pay | Admitting: *Deleted

## 2019-11-13 ENCOUNTER — Encounter: Payer: Self-pay | Admitting: *Deleted

## 2019-11-13 MED ORDER — AZITHROMYCIN 250 MG PO TABS
ORAL_TABLET | ORAL | 0 refills | Status: DC
Start: 2019-11-13 — End: 2019-12-31

## 2019-11-13 MED ORDER — FLUTICASONE PROPIONATE 50 MCG/ACT NA SUSP: 2.0000 | Freq: Every day | NASAL | 6 refills | Status: DC | PRN

## 2019-11-13 NOTE — Telephone Encounter (Signed)
Per Dr. Lavera Guise okay to fill fluticasone and zpak for patient.

## 2019-11-14 LAB — SURGICAL PATHOLOGY

## 2019-11-17 ENCOUNTER — Encounter: Payer: Self-pay | Admitting: Internal Medicine

## 2019-11-17 ENCOUNTER — Inpatient Hospital Stay: Payer: Medicare HMO

## 2019-11-17 ENCOUNTER — Inpatient Hospital Stay: Payer: Medicare HMO | Admitting: Internal Medicine

## 2019-11-17 ENCOUNTER — Other Ambulatory Visit: Payer: Self-pay

## 2019-11-17 ENCOUNTER — Inpatient Hospital Stay: Payer: Medicare HMO | Attending: Internal Medicine

## 2019-11-17 VITALS — BP 128/59 | HR 54 | Temp 96.2°F | Resp 18

## 2019-11-17 DIAGNOSIS — D509 Iron deficiency anemia, unspecified: Secondary | ICD-10-CM

## 2019-11-17 DIAGNOSIS — I10 Essential (primary) hypertension: Secondary | ICD-10-CM | POA: Insufficient documentation

## 2019-11-17 DIAGNOSIS — E032 Hypothyroidism due to medicaments and other exogenous substances: Secondary | ICD-10-CM | POA: Diagnosis not present

## 2019-11-17 DIAGNOSIS — Z79899 Other long term (current) drug therapy: Secondary | ICD-10-CM | POA: Diagnosis not present

## 2019-11-17 DIAGNOSIS — N184 Chronic kidney disease, stage 4 (severe): Secondary | ICD-10-CM | POA: Diagnosis not present

## 2019-11-17 DIAGNOSIS — Z5112 Encounter for antineoplastic immunotherapy: Secondary | ICD-10-CM | POA: Insufficient documentation

## 2019-11-17 DIAGNOSIS — C678 Malignant neoplasm of overlapping sites of bladder: Secondary | ICD-10-CM | POA: Insufficient documentation

## 2019-11-17 DIAGNOSIS — N400 Enlarged prostate without lower urinary tract symptoms: Secondary | ICD-10-CM | POA: Diagnosis not present

## 2019-11-17 DIAGNOSIS — Z7189 Other specified counseling: Secondary | ICD-10-CM

## 2019-11-17 DIAGNOSIS — Z7982 Long term (current) use of aspirin: Secondary | ICD-10-CM | POA: Insufficient documentation

## 2019-11-17 DIAGNOSIS — Z87891 Personal history of nicotine dependence: Secondary | ICD-10-CM | POA: Insufficient documentation

## 2019-11-17 DIAGNOSIS — D631 Anemia in chronic kidney disease: Secondary | ICD-10-CM | POA: Diagnosis not present

## 2019-11-17 LAB — CBC WITH DIFFERENTIAL/PLATELET
Abs Immature Granulocytes: 0.01 10*3/uL (ref 0.00–0.07)
Basophils Absolute: 0.1 10*3/uL (ref 0.0–0.1)
Basophils Relative: 1 %
Eosinophils Absolute: 1.2 10*3/uL — ABNORMAL HIGH (ref 0.0–0.5)
Eosinophils Relative: 18 %
HCT: 25.7 % — ABNORMAL LOW (ref 39.0–52.0)
Hemoglobin: 8.8 g/dL — ABNORMAL LOW (ref 13.0–17.0)
Immature Granulocytes: 0 %
Lymphocytes Relative: 26 %
Lymphs Abs: 1.8 10*3/uL (ref 0.7–4.0)
MCH: 33.5 pg (ref 26.0–34.0)
MCHC: 34.2 g/dL (ref 30.0–36.0)
MCV: 97.7 fL (ref 80.0–100.0)
Monocytes Absolute: 0.5 10*3/uL (ref 0.1–1.0)
Monocytes Relative: 8 %
Neutro Abs: 3.2 10*3/uL (ref 1.7–7.7)
Neutrophils Relative %: 47 %
Platelets: 232 10*3/uL (ref 150–400)
RBC: 2.63 MIL/uL — ABNORMAL LOW (ref 4.22–5.81)
RDW: 12.8 % (ref 11.5–15.5)
WBC: 6.8 10*3/uL (ref 4.0–10.5)
nRBC: 0 % (ref 0.0–0.2)

## 2019-11-17 LAB — COMPREHENSIVE METABOLIC PANEL
ALT: 18 U/L (ref 0–44)
AST: 21 U/L (ref 15–41)
Albumin: 4.1 g/dL (ref 3.5–5.0)
Alkaline Phosphatase: 66 U/L (ref 38–126)
Anion gap: 7 (ref 5–15)
BUN: 36 mg/dL — ABNORMAL HIGH (ref 8–23)
CO2: 23 mmol/L (ref 22–32)
Calcium: 9.2 mg/dL (ref 8.9–10.3)
Chloride: 109 mmol/L (ref 98–111)
Creatinine, Ser: 2.27 mg/dL — ABNORMAL HIGH (ref 0.61–1.24)
GFR calc Af Amer: 29 mL/min — ABNORMAL LOW (ref >60)
GFR calc non Af Amer: 25 mL/min — ABNORMAL LOW (ref >60)
Glucose, Bld: 74 mg/dL (ref 70–99)
Potassium: 3.9 mmol/L (ref 3.5–5.1)
Sodium: 139 mmol/L (ref 135–145)
Total Bilirubin: 0.7 mg/dL (ref 0.3–1.2)
Total Protein: 7.2 g/dL (ref 6.5–8.1)

## 2019-11-17 LAB — IRON AND TIBC
Iron: 92 ug/dL (ref 45–182)
Saturation Ratios: 39 % (ref 17.9–39.5)
TIBC: 234 ug/dL — ABNORMAL LOW (ref 250–450)
UIBC: 142 ug/dL

## 2019-11-17 LAB — FERRITIN: Ferritin: 379 ng/mL — ABNORMAL HIGH (ref 24–336)

## 2019-11-17 MED ORDER — SODIUM CHLORIDE 0.9 % IV SOLN
Freq: Once | INTRAVENOUS | Status: AC
  Filled 2019-11-17: qty 250

## 2019-11-17 MED ORDER — SODIUM CHLORIDE 0.9% FLUSH
10.0000 mL | INTRAVENOUS | Status: DC | PRN
  Filled 2019-11-17: qty 10

## 2019-11-17 MED ORDER — HEPARIN SOD (PORK) LOCK FLUSH 100 UNIT/ML IV SOLN
500.0000 [IU] | Freq: Once | INTRAVENOUS | Status: DC | PRN
  Filled 2019-11-17: qty 5

## 2019-11-17 MED ORDER — SODIUM CHLORIDE 0.9 % IV SOLN
240.0000 mg | Freq: Once | INTRAVENOUS | Status: AC
  Administered 2019-11-17: 240 mg via INTRAVENOUS
  Filled 2019-11-17: qty 24

## 2019-11-17 NOTE — Progress Notes (Signed)
[facility] NOTE  Patient Care Team: [name], [name], [name], [name]; [name]; [name]; mediastinal uptake; right pubic rami uptake.  # [name]; Jan 18th 2021- switched to [place]- [pt preference; [name]; [name]; [name]  ;GOALS: palliative  CURRENT/MOST RECENT THERAPY:OPDIVO [C]     Cancer of overlapping sites of bladder ([place])  02/28/2018 - 06/29/2019 Chemotherapy   The patient had atezolizumab (TECENTRIQ) 1,[name], 1,200 mg, Intravenous, Once, [name],200 mg (02/28/2018), 1,200 mg (03/21/2018), 1,200 mg (04/11/2018), 1,200 mg (05/02/2018), 1,200 mg (06/13/2018), 1,200 mg (05/23/2018), 1,200 mg (07/04/2018), 1,200 mg (07/25/2018), 1,200 mg (08/15/2018), 1,200 mg (09/05/2018), 1,200 mg (10/25/2018), 1,200 mg (11/22/2018), 1,200 mg (12/20/2018), 1,200 mg (01/10/2019), 1,200 mg (01/31/2019), 1,200 mg (02/21/2019), 1,200 mg (03/14/2019), 1,200 mg (04/04/2019), 1,200 mg (04/25/2019), 1,200 mg (05/16/2019), 1,200 mg (06/09/2019)  for chemotherapy treatment.    06/30/2019 -  Chemotherapy   The patient had nivolumab (OPDIVO) 240 mg in sodium chloride 0.9 %  100 mL chemo infusion, 240 mg, Intravenous, Once, 9 of 13 cycles Administration: 240 mg (06/30/2019), 240 mg (07/14/2019), 240 mg (07/28/2019), 240 mg (08/11/2019), 240 mg (08/25/2019), 240 mg (09/08/2019), 240 mg (09/22/2019), 240 mg (11/03/2019)  for chemotherapy treatment.     HISTORY OF PRESENTING ILLNESS: Edward Cummings 86 y.o.  male with metastatic transitional carcinoma of the bladder currently on Opdivo is here for follow-up.  In the interim patient had EGD-noted to have gastritis; otherwise no obvious source of bleeding noted.  Weight overall stable.  Appetite is fair.  No nausea no vomiting.  No diarrhea.  Review of Systems  Constitutional: Positive for malaise/fatigue. Negative for chills, [name], hemoptysis, sputum production and wheezing.   Cardiovascular: Negative for chest pain, palpitations, orthopnea and leg swelling.  Gastrointestinal: Negative for abdominal pain, blood in stool, diarrhea, heartburn, melena, nausea and vomiting.  Genitourinary: Negative for dysuria, frequency and urgency.  Musculoskeletal: Positive for back pain. Negative for joint pain.  Skin: Negative.  Negative for itching and rash.  Neurological: Negative for tingling, focal weakness, weakness and headaches.  Endo/Heme/Allergies: Does not bruise/bleed easily.  Psychiatric/Behavioral: Negative for depression. The patient is not nervous/anxious and does not have insomnia.      MEDICAL HISTORY:  Past Medical History:  Diagnosis Date  . Anemia   . Cancer ([place])    bladder  . Chronic kidney disease   . Depression   . Hypertension   . Neuromuscular disorder ([place])    Nerve damage to left face/eye since around 2002.    SURGICAL HISTORY: Past Surgical History:  Procedure Laterality Date  . CYSTOSCOPY  W/ RETROGRADES Bilateral 01/25/2018    Procedure: CYSTOSCOPY WITH RETROGRADE PYELOGRAM;  [name], MD;  [name];  Service: Urology;  Laterality: Bilateral;  . CYSTOSCOPY W/ URETERAL STENT PLACEMENT Bilateral 01/06/2019   Procedure: CYSTOSCOPY WITH RETROGRADE PYELOGRAM/URETERAL STENT REMOVAL;  [name], MD;  [name];  Service: Urology;  Laterality: Bilateral;  . CYSTOSCOPY WITH STENT PLACEMENT Bilateral 01/25/2018   Procedure: CYSTOSCOPY WITH STENT PLACEMENT;  [name], MD;  [name];  Service: Urology;  Laterality: Bilateral;  . DORSAL SLIT N/A 01/06/2019   Procedure: DORSAL SLIT;  [name], MD;  [name];  Service: Urology;  [name];  . ESOPHAGOGASTRODUODENOSCOPY (EGD) WITH PROPOFOL N/A 11/12/2019   Procedure: ESOPHAGOGASTRODUODENOSCOPY (EGD) WITH PROPOFOL;  Surgeon: Virgel Manifold, MD;  Location: [facility];  Service: Endoscopy;  [name];  . EYE SURGERY     Cornea transplants bilaterally & cataract surgery.  . TRANSURETHRAL RESECTION OF BLADDER TUMOR N/A 01/25/2018   Procedure: TRANSURETHRAL RESECTION OF BLADDER TUMOR (TURBT);  [name], MD;  [name];  Service: Urology;  [name];    SOCIAL HISTORY: lives in [place]; with wife; quit smoking 18 years ago; beer every 2 months or so.mechanic/retd.   Social History   Socioeconomic History  . Marital status: Married    Spouse name: [name]  . Number of children: Not on file  . Years of education: Not on file  . Highest education level: Not on file  Occupational History  . Not on file  Tobacco Use  . Smoking status: Former Smoker    Types: Cigarettes  . Smokeless tobacco: Current User    Types: Chew  . Tobacco comment: Stopped approximately 10 years ago.  Substance and Sexual Activity  . Alcohol use: Not Currently    Alcohol/week: 2.0 standard drinks    Types: 2 Cans of beer per week    Comment: Daily  .  Drug use: Never  . Sexual activity: Yes    Birth control/protection: None  Other Topics Concern  . Not on file  Social History Narrative  . Not on file   Social Determinants of Health   Financial Resource Strain:   . Difficulty of Paying Living Expenses:   Food Insecurity:   . Worried About Charity fundraiser in the Last Year:   . Arboriculturist in the Last Year:   Transportation Needs:   . Film/video editor (Medical):   Marland Kitchen Lack of Transportation (Non-Medical):   Physical Activity:   . Days of Exercise per Week:   . Minutes of Exercise per Session:   Stress:   . Feeling of Stress :   Social Connections:   . Frequency of Communication with Friends and Family:   . Frequency of Social Gatherings with Friends and Family:   . Attends Religious Services:   . Active Member of Clubs or Organizations:   . Attends Archivist Meetings:   Marland Kitchen Marital Status:   Intimate Partner Violence:   . Fear of Current or Ex-Partner:   . Emotionally Abused:   Marland Kitchen Physically Abused:   . Sexually Abused:     FAMILY HISTORY: Family History  Problem Relation Age of Onset  . Prostate cancer Neg Hx   . Kidney cancer Neg Hx   . Bladder Cancer Neg Hx     ALLERGIES:  has No Known Allergies.  MEDICATIONS:  Current Outpatient Medications  Medication Sig Dispense Refill  . amLODipine (NORVASC) 5 MG tablet Take 1 tablet (5 mg total) by mouth daily. 30 tablet 3  . aspirin EC 81 MG tablet Take 81 mg by mouth daily.     Marland Kitchen azithromycin (ZITHROMAX) 250 MG tablet As directed 6 tablet 0  . docusate sodium (COLACE) 50 MG capsule Take 50 mg by mouth at bedtime.    . fexofenadine (ALLEGRA) 180 MG tablet Take 180 mg by mouth daily.    . fluticasone (FLONASE) 50 MCG/ACT nasal spray Place 2 sprays into both nostrils daily as needed for allergies or rhinitis. 11.1 mL 6  . HYDROcodone-acetaminophen (NORCO/VICODIN) 5-325 MG tablet Take 1 tablet by mouth every 8 (eight) hours as needed for moderate  pain. 30 tablet 0  . levothyroxine (SYNTHROID) 88 MCG tablet Take 1 tablet (88 mcg total) by mouth daily before breakfast. Empty stomach- 1 hour prior to breakfast. 90 tablet 3  . Multiple Vitamin (MULTIVITAMIN WITH MINERALS) TABS tablet Take 1 tablet by mouth daily.     Marland Kitchen oxybutynin (DITROPAN) 5 MG tablet Take 1 tablet by mouth 3 (three) times daily as needed.    . prednisoLONE acetate (PRED FORTE) 1 % ophthalmic suspension Place 1 drop into both eyes daily.    . tamsulosin (FLOMAX) 0.4 MG CAPS capsule Take 1 capsule (0.4 mg total) by mouth at bedtime. 90 capsule 1   No current facility-administered medications for this visit.   Facility-Administered Medications Ordered in Other Visits  Medication Dose Route Frequency Provider Last Rate Last Admin  . 0.9 %  sodium chloride infusion   Intravenous Once Charlaine Dalton R, MD      . heparin lock flush 100 unit/mL  500 Units Intracatheter Once PRN Cammie Sickle, MD      . nivolumab (OPDIVO) 240 mg in sodium chloride 0.9 % 100 mL chemo infusion  240 mg Intravenous Once Charlaine Dalton R, MD      . sodium chloride flush (NS) 0.9 % injection 10 mL  10 mL Intracatheter PRN Cammie Sickle, MD          .  PHYSICAL EXAMINATION: ECOG PERFORMANCE STATUS: 1 - Symptomatic but completely ambulatory  Vitals:   11/17/19 0850  BP: (!) 135/51  Pulse: (!) 50  Resp: 20  Temp: (!) 97.5 F (36.4 C)   Filed Weights   11/17/19 0850  Weight: 123 lb (55.8 kg)    Physical Exam  Constitutional: He is oriented to person, place, and time.  With his wife  Walking himself.  Thin built moderately nourished male patient.  HENT:  Head: Normocephalic and atraumatic.  Mouth/Throat: Oropharynx is clear and moist. No oropharyngeal exudate.  Eyes: Pupils are equal, round, and reactive to light.  Chronic drooping of the left eyelid.  Cardiovascular: Normal rate and regular rhythm.  Pulmonary/Chest: No respiratory distress. He has no wheezes.   Abdominal: Soft. Bowel sounds are normal. He exhibits no distension and no mass. There is no abdominal tenderness. There is no rebound and no guarding.  Musculoskeletal:        General: No tenderness or edema. Normal range of motion.     Cervical back: Normal range of motion and neck supple.  Neurological: He is alert and oriented to person, place, and time.  Skin: Skin is warm.  Psychiatric: Affect normal.     LABORATORY DATA:  I have reviewed the data as listed Lab Results  Component Value Date   WBC 6.8 11/17/2019   HGB  8.8 (L) 11/17/2019   HCT 25.7 (L) 11/17/2019   MCV 97.7 11/17/2019   PLT 232 11/17/2019   Recent Labs    09/22/19 0901 11/03/19 0827 11/17/19 0830  NA 140 141 139  K 5.0 4.1 3.9  CL 109 111 109  CO2 _0 GLUCOSE 148* 141* 74  BUN 28* 39* 36*  CREATININE 2.21* 2.19* 2.27*  CALCIUM 9.3 9.1 9.2  GFRNONAA 26* 26* 25*  GFRAA 30* 31* 29*  PROT 7.1 7.0 7.2  ALBUMIN 4.1 4.1 4.1  AST _1 ALT _2 ALKPHOS 73 63 66  BILITOT 0.9 0.6 0.7    RADIOGRAPHIC STUDIES: I have personally reviewed the radiological images as listed and agreed with the findings in the report. No results found.  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  Stage IV; FEB 12th, 2021-  CT-chest and pelvis-noncontrast- negative for recurrence; prostatomegaly /thickening at the GE junction  [see below]. chronic thickening of bladder stable.  Currently on opdivo. STABLE.   #Proceed with Opdivo today. Labs today reviewed;  acceptable for treatment today. Will order scans today.    # Iatrogenic hypothyroidism-on Synthroid 88 mcg; STABLE;TSH May 24th 2021-0.3. check Thyroid profile.   # Enlarged prostate-on CT asymptomatic [Dr.Stoiff]; PSA-0.95-normal.  Stable  # Anemia sec to CKD/ on Iv iron.Hb-8.8; awaiting iron studies today; plan venofer at next  viist.   # Dysphagia- intermittent- s/p EGD- no malignancy on path; ? Gastrtitis. Defer to GI.   # weight loss- see above; recommend protein shakes/boost.  # CKD stage IV-GFR 26=STABLE.   # DISPOSITION:  # treatment today. # Follow-up in 2 weeks- -MD labs-cbc/cmp;thyroid profile; Venofer/opdivo IV;  CT A/P prior- -Dr.B   All questions were answered. The patient knows to call the clinic with any problems, questions or concerns.    Cammie Sickle, MD 11/17/2019 9:47 AM

## 2019-11-17 NOTE — Assessment & Plan Note (Addendum)
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  Stage IV; FEB 12th, 2021-  CT-chest and pelvis-noncontrast- negative for recurrence; prostatomegaly /thickening at the GE junction  [see below]. chronic thickening of bladder stable.  Currently on opdivo. STABLE.   #Proceed with Opdivo today. Labs today reviewed;  acceptable for treatment today. Will order scans today.    # Iatrogenic hypothyroidism-on Synthroid 88 mcg; STABLE;TSH May 24th 2021-0.3. check Thyroid profile.   # Enlarged prostate-on CT asymptomatic [Dr.Stoiff]; PSA-0.95-normal.  Stable  # Anemia sec to CKD/ on Iv iron.Hb-8.8; awaiting iron studies today; plan venofer at next viist.   # Dysphagia- intermittent- s/p EGD- no malignancy on path; ? Gastrtitis. Defer to GI.   # weight loss- see above; recommend protein shakes/boost.  # CKD stage IV-GFR 26=STABLE.   # DISPOSITION:  # treatment today. # Follow-up in 2 weeks- -MD labs-cbc/cmp;thyroid profile; Venofer/opdivo IV;  CT A/P prior- -Dr.B

## 2019-11-21 ENCOUNTER — Telehealth: Payer: Self-pay

## 2019-11-21 NOTE — Telephone Encounter (Signed)
-----   Message from Virgel Manifold, MD sent at 11/21/2019 11:00 AM EDT ----- Edward Cummings please let the patient know, his biopsy results are benign. But I would like to discuss his symptoms and further management with him in clinic. Phone appointment is ok if that's easier for him

## 2019-11-21 NOTE — Telephone Encounter (Signed)
Called patient to let him know that Dr. Bonna Gains reviewed his pathology results and that they were benign. However, Dr. Bonna Gains would like to have a telephone visit with him to discuss his symptoms further. Patient agreed. Elmyra Ricks will call patient to schedule him an appointment. She was notified by secure chat.

## 2019-11-24 ENCOUNTER — Other Ambulatory Visit: Payer: Self-pay | Admitting: Internal Medicine

## 2019-11-27 ENCOUNTER — Other Ambulatory Visit: Payer: Self-pay

## 2019-11-27 ENCOUNTER — Telehealth: Payer: Medicare HMO | Admitting: Gastroenterology

## 2019-11-27 ENCOUNTER — Telehealth: Payer: Self-pay

## 2019-11-27 ENCOUNTER — Ambulatory Visit: Admission: RE | Admit: 2019-11-27 | Payer: Medicare HMO | Source: Ambulatory Visit

## 2019-11-27 ENCOUNTER — Telehealth: Payer: Self-pay | Admitting: *Deleted

## 2019-11-27 NOTE — Telephone Encounter (Signed)
Son Legrand Como called reporting that patient CT has been rescheduled to the 29th due to OSIAH HARING his wife passing away this morning. He is asking for his FOLLOW UP appointment on 12/01/19 with Dr B to be moved until after the CT is done. Please call Micheal

## 2019-11-27 NOTE — Telephone Encounter (Signed)
Dr. Bonna Gains,  I wanted to let you know that Mr. Poupard son called this morning.  He said his mother passed away this morning.  He had to cancel the CT scan that was order for today for his fatther.  Thank you,  Sharyn Lull, CMA

## 2019-11-27 NOTE — Telephone Encounter (Signed)
Called patient but spoke to son-Edward Cummings since his father was not available to talk. Edward Cummings stated that Edward Cummings wife had passed away this AM and was not going to be available for today's visit. In addition, Edward Cummings stated that his dad was feeling better and did not feel that he needed to follow up with Dr. Bonna Gains. I told him that I was glad that he was feeling better but if he needed our assistance again, to please call us back. Edward Cummings understood and had no further questions. Today's appointment will be cancelled per Edward Cummings-son.

## 2019-11-27 NOTE — Telephone Encounter (Signed)
Edward Cummings, ok to move the apts after the ct scan on the 30th (if available apts with infusion).

## 2019-12-01 ENCOUNTER — Inpatient Hospital Stay: Payer: Medicare HMO

## 2019-12-01 ENCOUNTER — Inpatient Hospital Stay: Payer: Medicare HMO | Admitting: Internal Medicine

## 2019-12-09 ENCOUNTER — Ambulatory Visit
Admission: RE | Admit: 2019-12-09 | Discharge: 2019-12-09 | Disposition: A | Discharge status: Home / Self Care | Payer: Medicare HMO | Source: Ambulatory Visit | Attending: Internal Medicine | Admitting: Internal Medicine

## 2019-12-09 ENCOUNTER — Other Ambulatory Visit: Payer: Self-pay

## 2019-12-09 DIAGNOSIS — C678 Malignant neoplasm of overlapping sites of bladder: Secondary | ICD-10-CM | POA: Diagnosis not present

## 2019-12-09 DIAGNOSIS — J929 Pleural plaque without asbestos: Secondary | ICD-10-CM | POA: Diagnosis not present

## 2019-12-09 DIAGNOSIS — N400 Enlarged prostate without lower urinary tract symptoms: Secondary | ICD-10-CM | POA: Diagnosis not present

## 2019-12-09 DIAGNOSIS — C679 Malignant neoplasm of bladder, unspecified: Secondary | ICD-10-CM | POA: Diagnosis not present

## 2019-12-09 IMAGING — CT CT ABD-PELV W/O CM
2 of 4 series · 13 of 46 positions shown, 15 images · non-contrast
Comparison: [DATE]

CLINICAL DATA: Follow-up bladder cancer.

EXAM:
CT CHEST, ABDOMEN AND PELVIS WITHOUT CONTRAST
TECHNIQUE: Multidetector CT imaging of the chest, abdomen and pelvis was
performed following the standard protocol without IV contrast.

[Series 2: axials cap 5.00 · axial · 0.70mm/px · z∈[-1486,-931]mm · 10 of 133 slices shown, 12 images]
[im 11/133  soft-tissue]
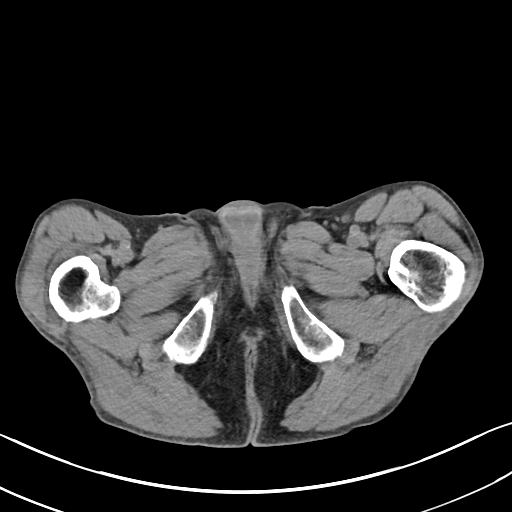
[im 11/133  bone]
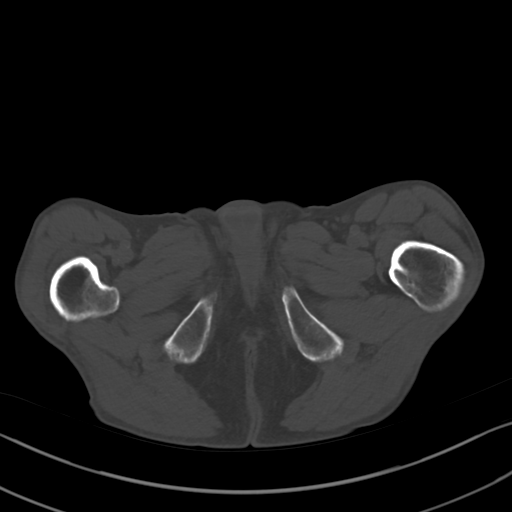
[im 21/133  soft-tissue]
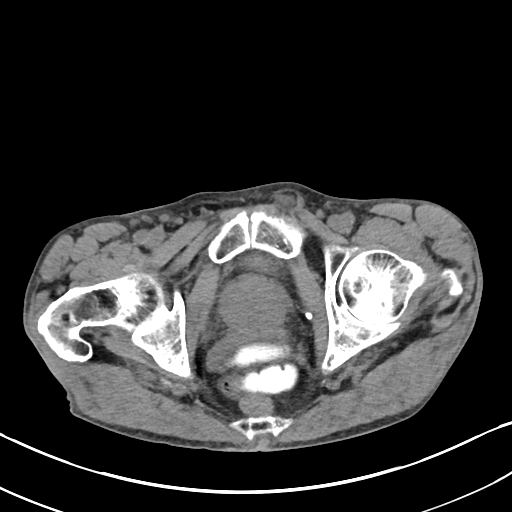
[im 41/133  soft-tissue]
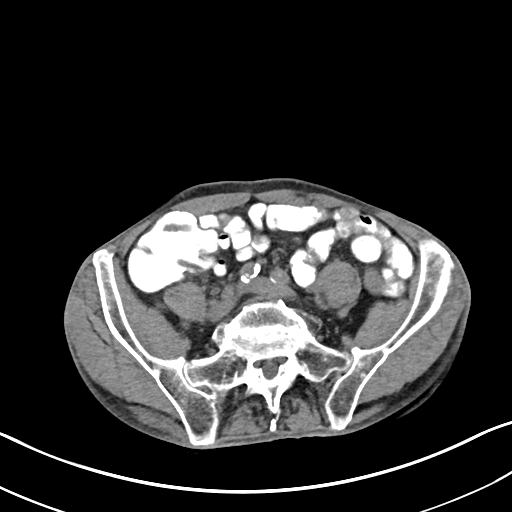
[im 51/133  soft-tissue]
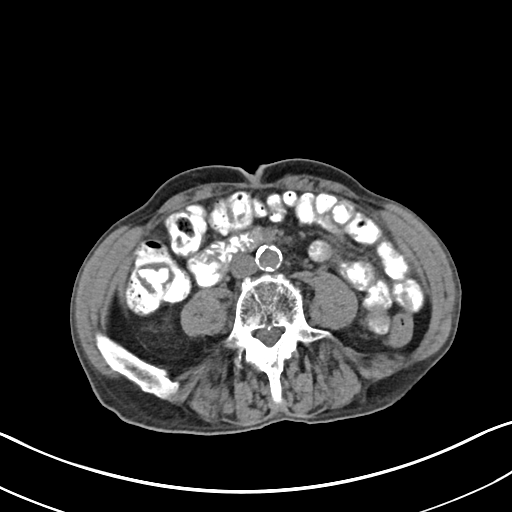
[im 61/133  soft-tissue]
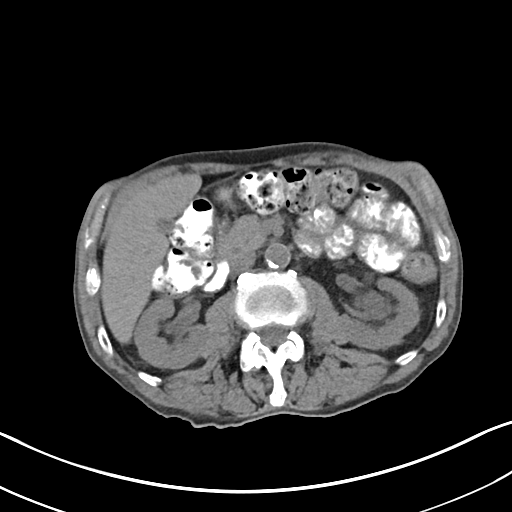
[im 72/133  soft-tissue]
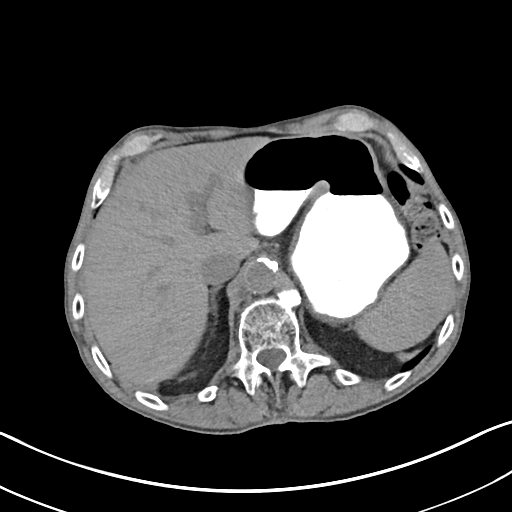
[im 82/133  soft-tissue]
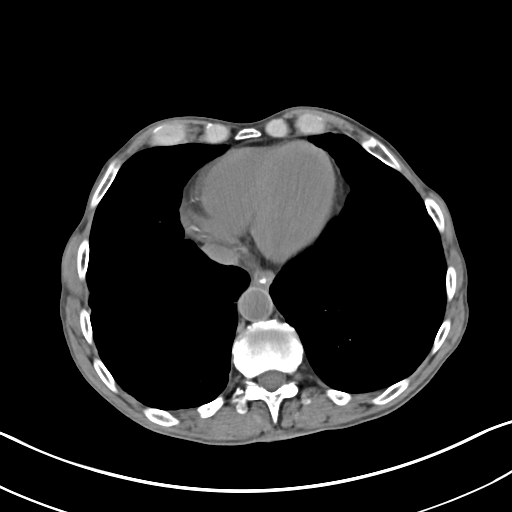
[im 102/133  soft-tissue]
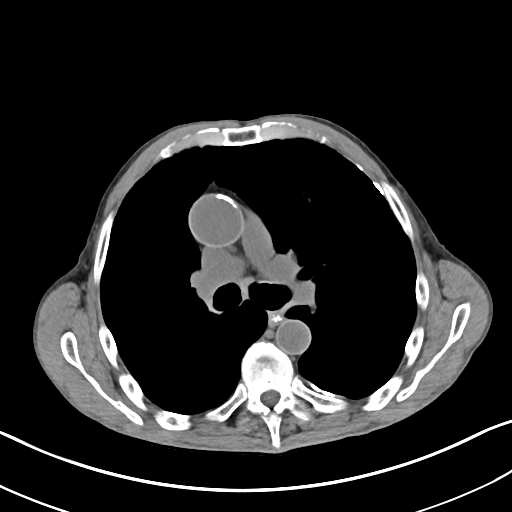
[im 112/133  soft-tissue]
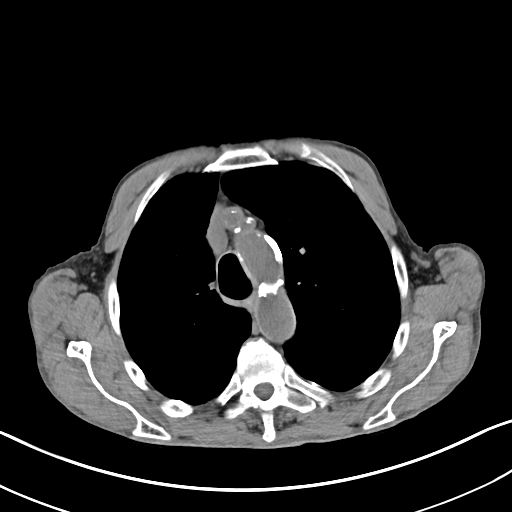
[im 112/133  bone]
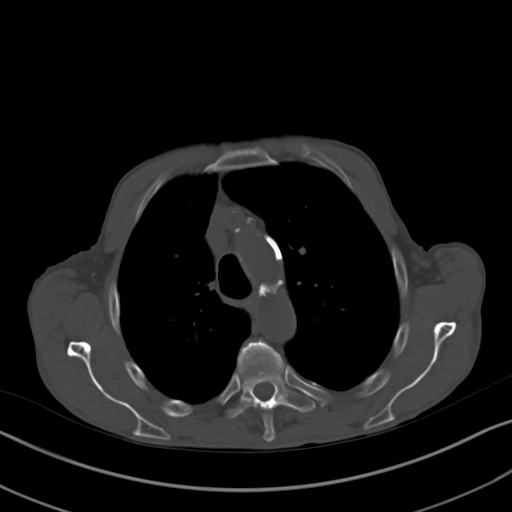
[im 122/133  soft-tissue]
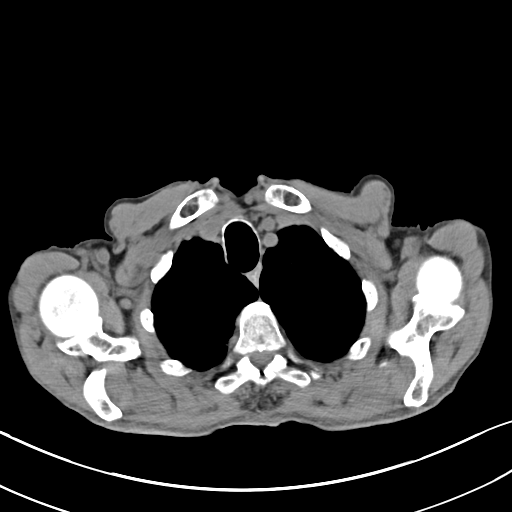

[Series 4: coronals cap 2.00 cor · coronal · 0.70mm/px · 3 of 139 slices shown]
[im 47/139  soft-tissue]
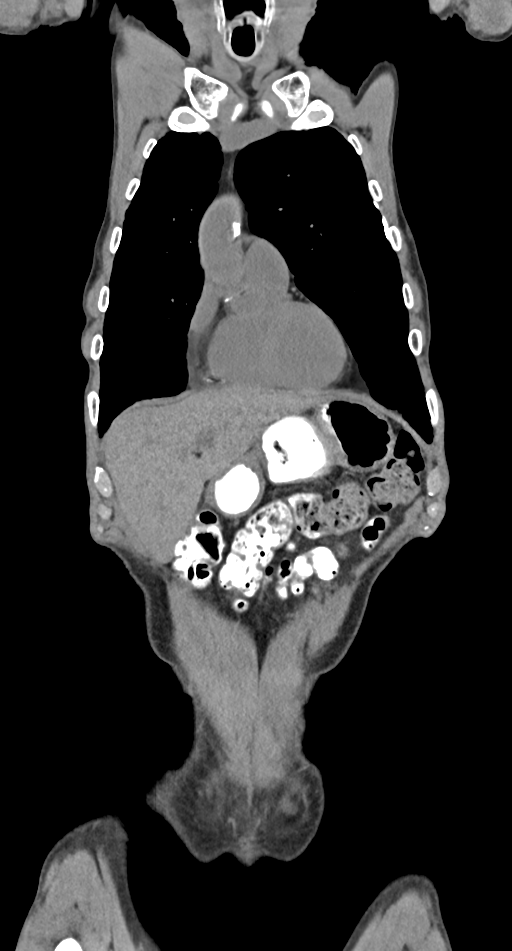
[im 62/139  soft-tissue]
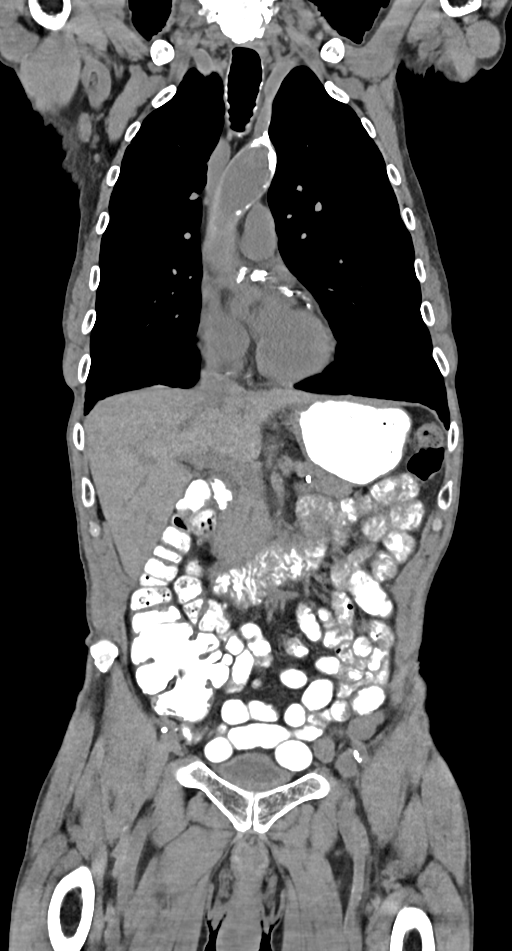
[im 77/139  soft-tissue]
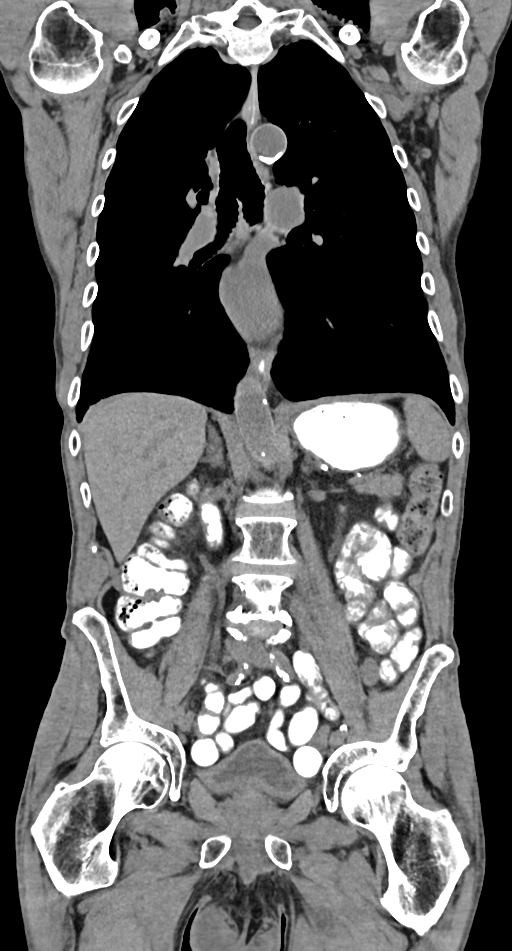

[13 of 46 positions shown; findings below may reference images not displayed]

FINDINGS: CT CHEST FINDINGS

Cardiovascular: Heart size appears within normal limits. No
pericardial effusion. Aortic atherosclerosis. Left main, lad, left
circumflex and RCA coronary artery calcifications.

Mediastinum/Nodes: The trachea appears patent and is midline. Normal
appearance of the esophagus. No enlarged mediastinal,
supraclavicular, or axillary adenopathy.

Lungs/Pleura: No pleural effusion, airspace consolidation or
atelectasis. Basilar predominant subpleural reticulation and banding
identified. Mild traction bronchiectasis with peripheral areas of
ground-glass attenuation noted. Bilateral calcified pleural plaques.
Unchanged 2 mm right upper lobe lung nodule, image 67/3. 3 mm left
lower lobe lung nodule is also unchanged, image 135/3. Calcified
granuloma noted in the left lower lobe, image 119/3. No new
pulmonary nodules.

Musculoskeletal: Spondylosis identified within the thoracic spine.
No aggressive lytic or sclerotic bone lesions identified.

CT ABDOMEN PELVIS FINDINGS

Hepatobiliary: Liver unremarkable. Gallbladder appears normal. No
biliary dilatation.

Pancreas: Unremarkable. No pancreatic ductal dilatation or
surrounding inflammatory changes.

Spleen: Normal in size without focal abnormality.

Adrenals/Urinary Tract: Normal appearance of the adrenal glands.
Bilateral kidney cysts are again noted, incompletely characterized
without IV contrast. Unchanged hyperdense left mid kidney lesion
measuring 1 cm, image 70/2. Pelvocaliectasis is noted bilaterally.
The ureters however appear decompressed. Mild bladder wall
thickening.

Stomach/Bowel: Stomach is within normal limits. Appendix appears
normal. No evidence of bowel wall thickening, distention, or
inflammatory changes.

Vascular/Lymphatic: Aortic atherosclerosis. No aneurysm. No
abdominopelvic adenopathy.

Reproductive: Prostate gland enlargement.

Other: No free fluid or fluid collections.

Musculoskeletal: Degenerative changes noted within the lumbar spine.
No aggressive lytic or sclerotic bone lesions.
IMPRESSION: 1. Stable CT of the chest, abdomen and pelvis. No specific findings
identified to suggest residual or recurrence of tumor or metastatic
disease.
2. Small nonspecific pulmonary nodules are unchanged from previous
exam.
3. Aortic atherosclerosis and 3 vessel coronary artery
calcifications.
4. Bilateral kidney cysts, incompletely characterized without IV
contrast.
5. Prostate gland enlargement.
6. Mild bladder wall thickening with bilateral pelvocaliectasis.
Similar to prior exam.
7. Aortic atherosclerosis.

Aortic Atherosclerosis ([QI]-[QI]).

## 2019-12-09 IMAGING — CT CT CHEST W/O CM
2 of 4 series · 12 of 36 positions shown, 15 images · non-contrast
Comparison: [DATE]

CLINICAL DATA: Follow-up bladder cancer.

EXAM:
CT CHEST, ABDOMEN AND PELVIS WITHOUT CONTRAST
TECHNIQUE: Multidetector CT imaging of the chest, abdomen and pelvis was
performed following the standard protocol without IV contrast.

[Series 2: axials cap 5.00 · axial · 0.70mm/px · z∈[-1481,-931]mm · 9 of 133 slices shown, 12 images]
[im 12/133  mediastinal]
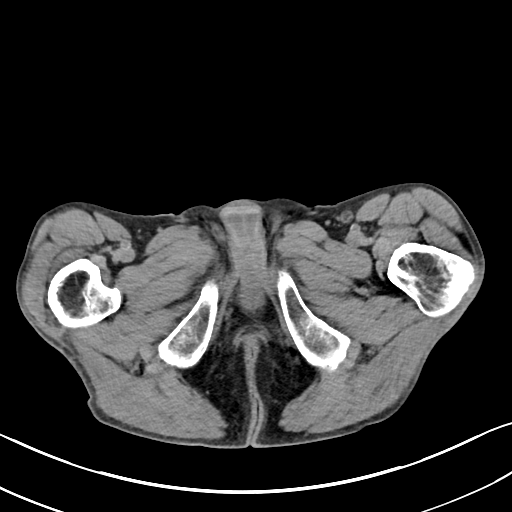
[im 12/133  lung]
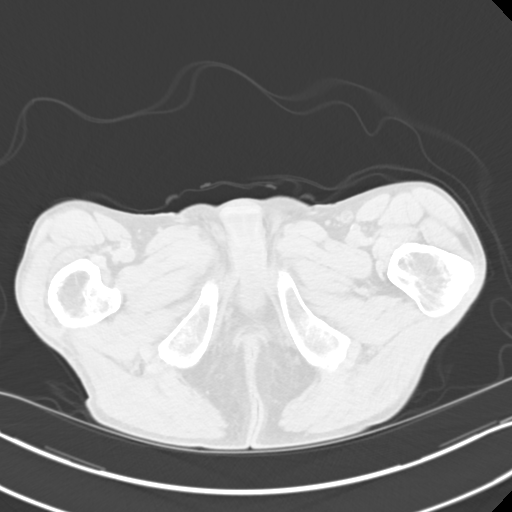
[im 23/133  lung]
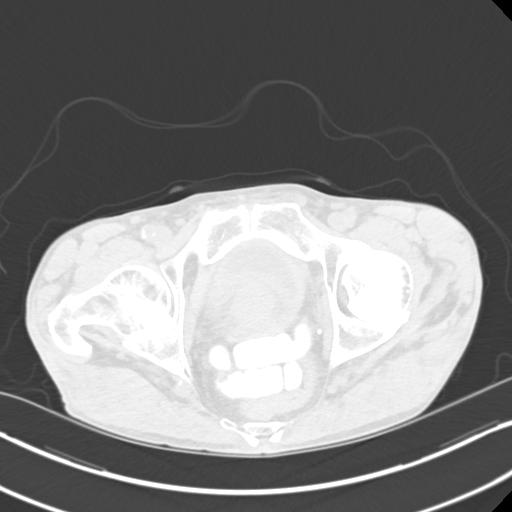
[im 45/133  lung]
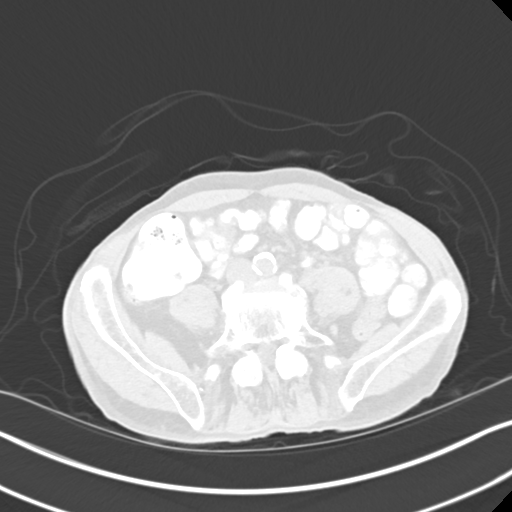
[im 56/133  lung]
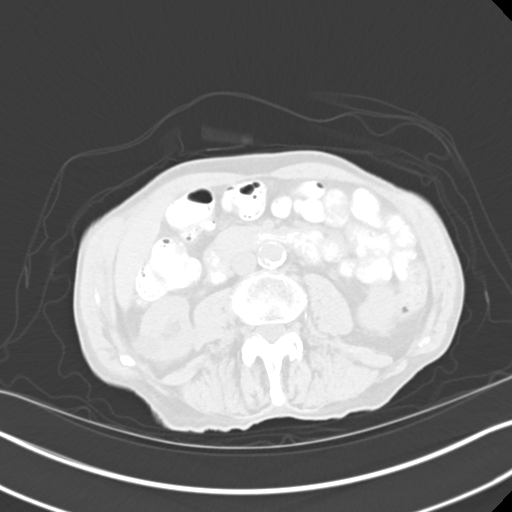
[im 67/133  mediastinal]
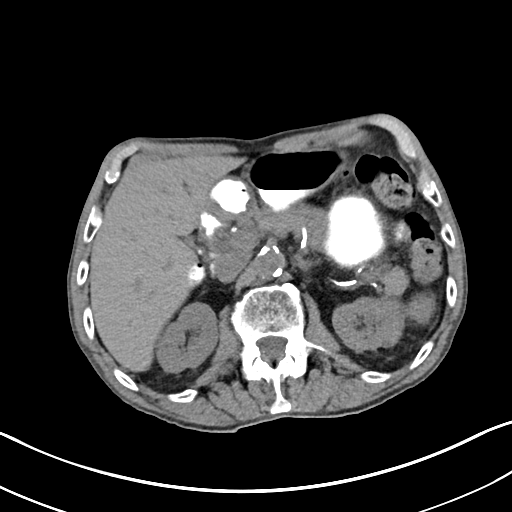
[im 67/133  lung]
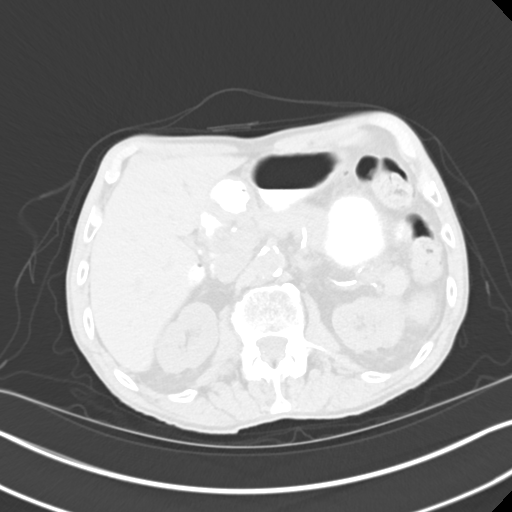
[im 78/133  lung]
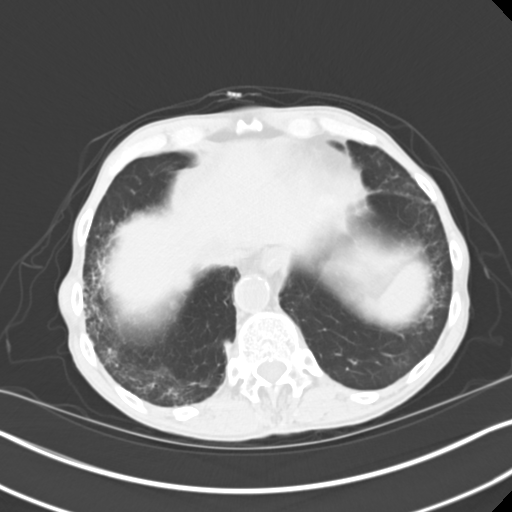
[im 89/133  lung]
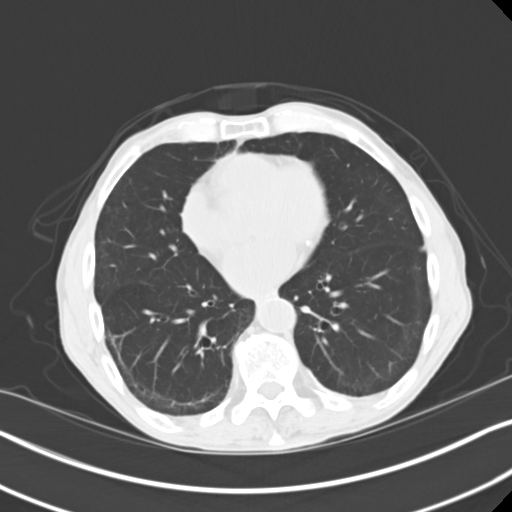
[im 111/133  lung]
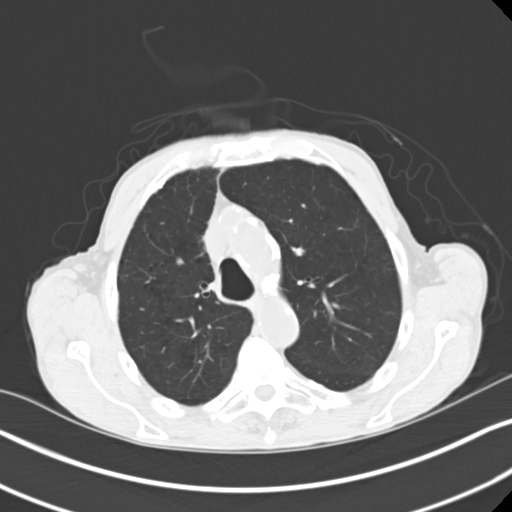
[im 122/133  mediastinal]
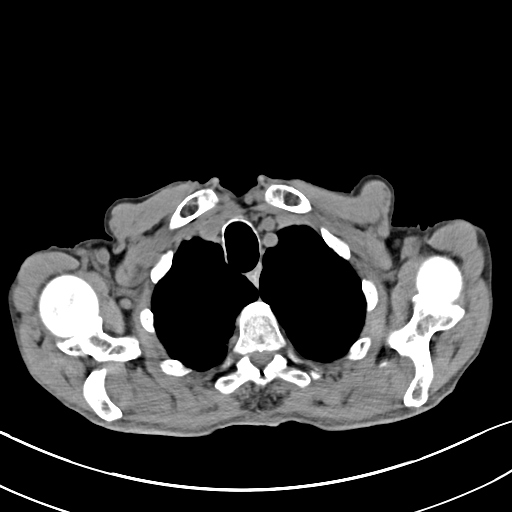
[im 122/133  lung]
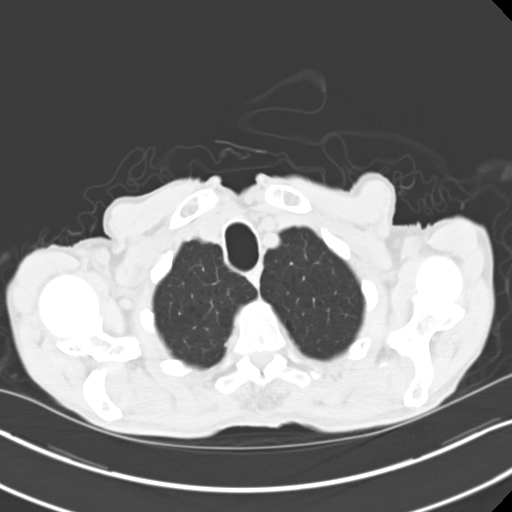

[Series 4: coronals cap 2.00 cor · coronal · 0.70mm/px · 3 of 139 slices shown]
[im 28/139  lung]
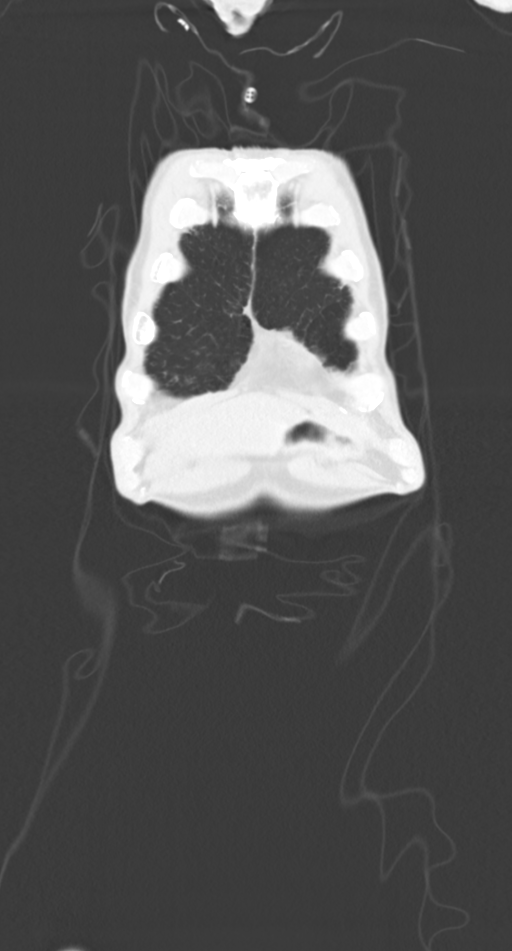
[im 56/139  lung]
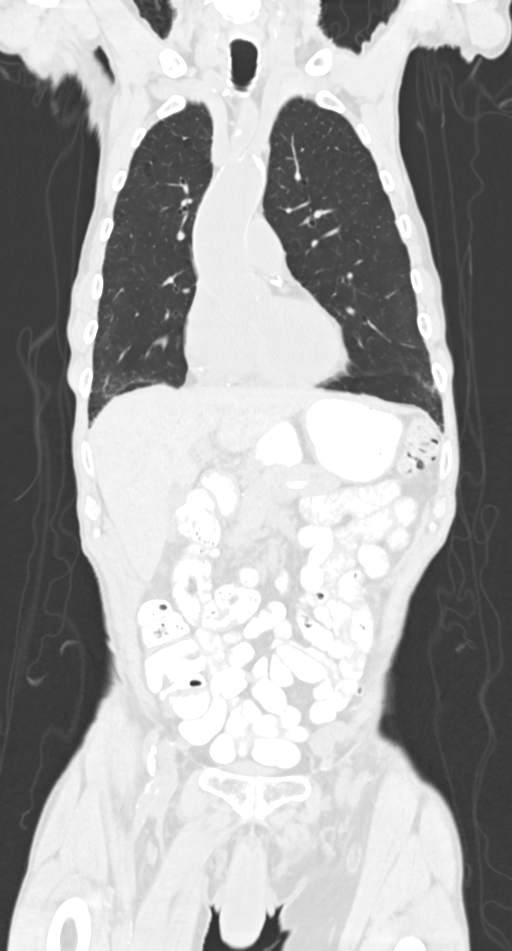
[im 83/139  lung]
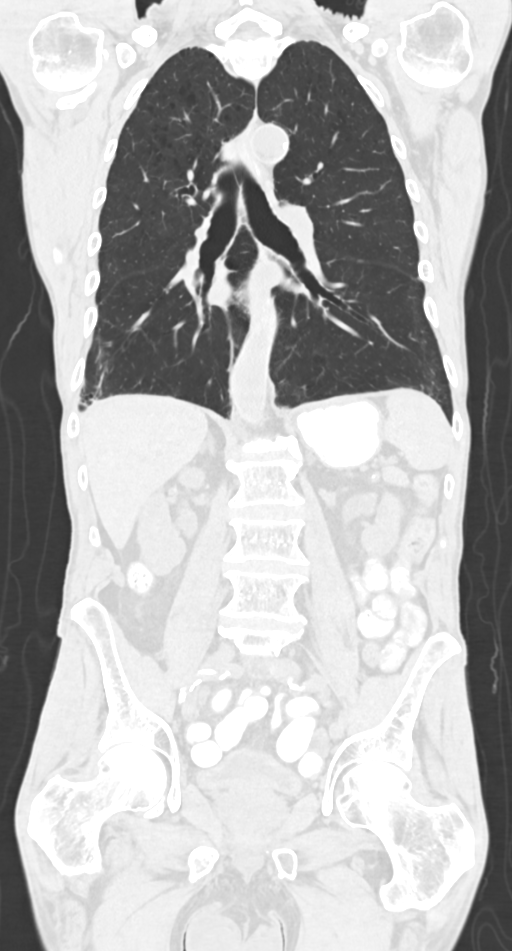

[12 of 36 positions shown; findings below may reference images not displayed]

FINDINGS: CT CHEST FINDINGS

Cardiovascular: Heart size appears within normal limits. No
pericardial effusion. Aortic atherosclerosis. Left main, lad, left
circumflex and RCA coronary artery calcifications.

Mediastinum/Nodes: The trachea appears patent and is midline. Normal
appearance of the esophagus. No enlarged mediastinal,
supraclavicular, or axillary adenopathy.

Lungs/Pleura: No pleural effusion, airspace consolidation or
atelectasis. Basilar predominant subpleural reticulation and banding
identified. Mild traction bronchiectasis with peripheral areas of
ground-glass attenuation noted. Bilateral calcified pleural plaques.
Unchanged 2 mm right upper lobe lung nodule, image 67/3. 3 mm left
lower lobe lung nodule is also unchanged, image 135/3. Calcified
granuloma noted in the left lower lobe, image 119/3. No new
pulmonary nodules.

Musculoskeletal: Spondylosis identified within the thoracic spine.
No aggressive lytic or sclerotic bone lesions identified.

CT ABDOMEN PELVIS FINDINGS

Hepatobiliary: Liver unremarkable. Gallbladder appears normal. No
biliary dilatation.

Pancreas: Unremarkable. No pancreatic ductal dilatation or
surrounding inflammatory changes.

Spleen: Normal in size without focal abnormality.

Adrenals/Urinary Tract: Normal appearance of the adrenal glands.
Bilateral kidney cysts are again noted, incompletely characterized
without IV contrast. Unchanged hyperdense left mid kidney lesion
measuring 1 cm, image 70/2. Pelvocaliectasis is noted bilaterally.
The ureters however appear decompressed. Mild bladder wall
thickening.

Stomach/Bowel: Stomach is within normal limits. Appendix appears
normal. No evidence of bowel wall thickening, distention, or
inflammatory changes.

Vascular/Lymphatic: Aortic atherosclerosis. No aneurysm. No
abdominopelvic adenopathy.

Reproductive: Prostate gland enlargement.

Other: No free fluid or fluid collections.

Musculoskeletal: Degenerative changes noted within the lumbar spine.
No aggressive lytic or sclerotic bone lesions.
IMPRESSION: 1. Stable CT of the chest, abdomen and pelvis. No specific findings
identified to suggest residual or recurrence of tumor or metastatic
disease.
2. Small nonspecific pulmonary nodules are unchanged from previous
exam.
3. Aortic atherosclerosis and 3 vessel coronary artery
calcifications.
4. Bilateral kidney cysts, incompletely characterized without IV
contrast.
5. Prostate gland enlargement.
6. Mild bladder wall thickening with bilateral pelvocaliectasis.
Similar to prior exam.
7. Aortic atherosclerosis.

Aortic Atherosclerosis ([QI]-[QI]).

## 2019-12-10 ENCOUNTER — Inpatient Hospital Stay: Payer: Medicare HMO

## 2019-12-10 ENCOUNTER — Inpatient Hospital Stay: Payer: Medicare HMO | Admitting: Internal Medicine

## 2019-12-10 ENCOUNTER — Encounter: Payer: Self-pay | Admitting: Internal Medicine

## 2019-12-10 VITALS — BP 130/54 | HR 50 | Resp 18

## 2019-12-10 DIAGNOSIS — D631 Anemia in chronic kidney disease: Secondary | ICD-10-CM

## 2019-12-10 DIAGNOSIS — C678 Malignant neoplasm of overlapping sites of bladder: Secondary | ICD-10-CM

## 2019-12-10 DIAGNOSIS — Z7189 Other specified counseling: Secondary | ICD-10-CM

## 2019-12-10 DIAGNOSIS — N1831 Chronic kidney disease, stage 3a: Secondary | ICD-10-CM

## 2019-12-10 DIAGNOSIS — Z5112 Encounter for antineoplastic immunotherapy: Secondary | ICD-10-CM | POA: Diagnosis not present

## 2019-12-10 LAB — COMPREHENSIVE METABOLIC PANEL
ALT: 13 U/L (ref 0–44)
AST: 20 U/L (ref 15–41)
Albumin: 4.1 g/dL (ref 3.5–5.0)
Alkaline Phosphatase: 65 U/L (ref 38–126)
Anion gap: 9 (ref 5–15)
BUN: 28 mg/dL — ABNORMAL HIGH (ref 8–23)
CO2: 21 mmol/L — ABNORMAL LOW (ref 22–32)
Calcium: 9 mg/dL (ref 8.9–10.3)
Chloride: 107 mmol/L (ref 98–111)
Creatinine, Ser: 2.36 mg/dL — ABNORMAL HIGH (ref 0.61–1.24)
GFR calc Af Amer: 28 mL/min — ABNORMAL LOW (ref >60)
GFR calc non Af Amer: 24 mL/min — ABNORMAL LOW (ref >60)
Glucose, Bld: 130 mg/dL — ABNORMAL HIGH (ref 70–99)
Potassium: 4 mmol/L (ref 3.5–5.1)
Sodium: 137 mmol/L (ref 135–145)
Total Bilirubin: 0.6 mg/dL (ref 0.3–1.2)
Total Protein: 7.2 g/dL (ref 6.5–8.1)

## 2019-12-10 LAB — CBC WITH DIFFERENTIAL/PLATELET
Abs Immature Granulocytes: 0.01 10*3/uL (ref 0.00–0.07)
Basophils Absolute: 0.1 10*3/uL (ref 0.0–0.1)
Basophils Relative: 1 %
Eosinophils Absolute: 1.1 10*3/uL — ABNORMAL HIGH (ref 0.0–0.5)
Eosinophils Relative: 17 %
HCT: 27.2 % — ABNORMAL LOW (ref 39.0–52.0)
Hemoglobin: 9.1 g/dL — ABNORMAL LOW (ref 13.0–17.0)
Immature Granulocytes: 0 %
Lymphocytes Relative: 34 %
Lymphs Abs: 2.3 10*3/uL (ref 0.7–4.0)
MCH: 32.7 pg (ref 26.0–34.0)
MCHC: 33.5 g/dL (ref 30.0–36.0)
MCV: 97.8 fL (ref 80.0–100.0)
Monocytes Absolute: 0.4 10*3/uL (ref 0.1–1.0)
Monocytes Relative: 6 %
Neutro Abs: 2.9 10*3/uL (ref 1.7–7.7)
Neutrophils Relative %: 42 %
Platelets: 206 10*3/uL (ref 150–400)
RBC: 2.78 MIL/uL — ABNORMAL LOW (ref 4.22–5.81)
RDW: 12.8 % (ref 11.5–15.5)
WBC: 6.9 10*3/uL (ref 4.0–10.5)
nRBC: 0 % (ref 0.0–0.2)

## 2019-12-10 MED ORDER — SODIUM CHLORIDE 0.9 % IV SOLN
240.0000 mg | Freq: Once | INTRAVENOUS | Status: AC
  Administered 2019-12-10: 240 mg via INTRAVENOUS
  Filled 2019-12-10: qty 24

## 2019-12-10 MED ORDER — SODIUM CHLORIDE 0.9 % IV SOLN
Freq: Once | INTRAVENOUS | Status: AC
  Filled 2019-12-10: qty 250

## 2019-12-10 MED ORDER — IRON SUCROSE 20 MG/ML IV SOLN
200.0000 mg | Freq: Once | INTRAVENOUS | Status: AC
  Administered 2019-12-10: 200 mg via INTRAVENOUS
  Filled 2019-12-10: qty 10

## 2019-12-10 NOTE — Assessment & Plan Note (Addendum)
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 chronic thickening of bladder stable.  Currently on opdivo. STABLE.   #Proceed with Opdivo today. Labs today reviewed;  acceptable for treatment today.   # Iatrogenic hypothyroidism-on Synthroid 88 mcg; STABLE;TSH May 24th 2021-0.3. check Thyroid profile.   # Enlarged prostate-on CT asymptomatic [Dr.Stoiff]; PSA-0.95-normal.  Stable  # Anemia sec to CKD/ on Iv iron.Hb-9.0 plan Venofer today. # Dysphagia- intermittent- s/p EGD- no malignancy on path; ? Gastrtitis; on PPI.  # weight loss- see above; recommend protein shakes/boost.  # CKD stage IV-GFR 26=STABLE.   # DISPOSITION:  # treatment today; venofer # Follow-up in 2 weeks- -MD labs-cbc/cmp;thyroid profile; Venofer/opdivo IV; -Dr.B  # I reviewed the blood work- with the patient in detail; also reviewed the imaging independently [as summarized above]; and with the patient in detail.

## 2019-12-10 NOTE — Progress Notes (Signed)
[facility] NOTE  Patient Care Team: [name], [name], [name], [name]; [name]; [name]; mediastinal uptake; right pubic rami uptake.  # [id number] Cummings; Jan 18th 2021- switched to [place]- [pt preference; [name]; [name]; [name]  ;GOALS: palliative  CURRENT/MOST RECENT THERAPY:OPDIVO [C]     Cancer of overlapping sites of bladder ([place])  02/28/2018 - 06/29/2019 Chemotherapy   The patient had atezolizumab (TECENTRIQ) 1,[name], 1,200 mg, Intravenous, Once, [name],200 mg (02/28/2018), 1,200 mg (03/21/2018), 1,200 mg (04/11/2018), 1,200 mg (05/02/2018), 1,200 mg (06/13/2018), 1,200 mg (05/23/2018), 1,200 mg (07/04/2018), 1,200 mg (07/25/2018), 1,200 mg (08/15/2018), 1,200 mg (09/05/2018), 1,200 mg (10/25/2018), 1,200 mg (11/22/2018), 1,200 mg (12/20/2018), 1,200 mg (01/10/2019), 1,200 mg (01/31/2019), 1,200 mg (02/21/2019), 1,200 mg (03/14/2019), 1,200 mg (04/04/2019), 1,200 mg (04/25/2019), 1,200 mg (05/16/2019), 1,200 mg (06/09/2019)  for chemotherapy treatment.    06/30/2019 -  Chemotherapy   The patient had nivolumab (OPDIVO) 240 mg in sodium chloride 0.9 %  100 mL chemo infusion, 240 mg, Intravenous, Once, 10 of 13 cycles Administration: 240 mg (06/30/2019), 240 mg (07/14/2019), 240 mg (07/28/2019), 240 mg (08/11/2019), 240 mg (08/25/2019), 240 mg (09/08/2019), 240 mg (09/22/2019), 240 mg (11/03/2019), 240 mg (11/17/2019), 240 mg (12/10/2019)  for chemotherapy treatment.     HISTORY OF PRESENTING ILLNESS: Edward Cummings 86 y.o.  male with metastatic transitional carcinoma of the bladder currently on Opdivo is here for follow-up/review results of the CT scan.  In the interim patient to lung cancer.  She was in hospice.  Patient is coping fairly well with his loss.  States he is getting his appetite.  He is eating well.  No nausea no vomiting.  No diarrhea.  Review of Systems  Constitutional: Positive for malaise/fatigue. Negative for chills, [name], hemoptysis, sputum production and wheezing.   Cardiovascular: Negative for chest pain, palpitations, orthopnea and leg swelling.  Gastrointestinal: Negative for abdominal pain, blood in stool, diarrhea, heartburn, melena, nausea and vomiting.  Genitourinary: Negative for dysuria, frequency and urgency.  Musculoskeletal: Positive for back pain. Negative for joint pain.  Skin: Negative.  Negative for itching and rash.  Neurological: Negative for tingling, focal weakness, weakness and headaches.  Endo/Heme/Allergies: Does not bruise/bleed easily.  Psychiatric/Behavioral: Negative for depression. The patient is not nervous/anxious and does not have insomnia.      MEDICAL HISTORY:  Past Medical History:  Diagnosis Date  . Anemia   . Cancer ([place])    bladder  . Chronic kidney disease   . Depression   . Hypertension   . Neuromuscular disorder ([place])    Nerve damage to  left face/eye since around 2002.    SURGICAL HISTORY: Past Surgical  History:  Procedure Laterality Date  . CYSTOSCOPY W/ RETROGRADES Bilateral 01/25/2018   Procedure: CYSTOSCOPY WITH RETROGRADE PYELOGRAM;  [name], MD;  [name];  Service: Urology;  Laterality: Bilateral;  . CYSTOSCOPY W/ URETERAL STENT PLACEMENT Bilateral 01/06/2019   Procedure: CYSTOSCOPY WITH RETROGRADE PYELOGRAM/URETERAL STENT REMOVAL;  [name], MD;  [name];  Service: Urology;  Laterality: Bilateral;  . CYSTOSCOPY WITH STENT PLACEMENT Bilateral 01/25/2018   Procedure: CYSTOSCOPY WITH STENT PLACEMENT;  [name], MD;  [name];  Service: Urology;  Laterality: Bilateral;  . DORSAL SLIT N/A 01/06/2019   Procedure: DORSAL SLIT;  [name], MD;  [name];  Service: Urology;  [name];  . ESOPHAGOGASTRODUODENOSCOPY (EGD) WITH PROPOFOL N/A 11/12/2019   Procedure: ESOPHAGOGASTRODUODENOSCOPY (EGD) WITH PROPOFOL;  Surgeon: Virgel Manifold, MD;  Location: [facility];  Service: Endoscopy;  [name];  . EYE SURGERY     Cornea transplants bilaterally & cataract surgery.  . TRANSURETHRAL RESECTION OF BLADDER TUMOR N/A 01/25/2018   Procedure: TRANSURETHRAL RESECTION OF BLADDER TUMOR (TURBT);  [name], MD;  [name];  Service: Urology;  [name];    SOCIAL HISTORY: lives in [place]; with wife; quit smoking 18 years ago; beer every 2 months or so.mechanic/retd.   Social History   Socioeconomic History  . Marital status: Married    Spouse name: [name]  . Number of children: Not on file  . Years of education: Not on file  . Highest education level: Not on file  Occupational History  . Not on file  Tobacco Use  . Smoking status: Former Smoker    Types: Cigarettes  . Smokeless tobacco: Current User    Types: Chew  . Tobacco comment: Stopped approximately 10 years ago.  Vaping Use  . Vaping Use: Never used  Substance and Sexual  Activity  . Alcohol use: Not Currently    Alcohol/week: 2.0 standard drinks    Types: 2 Cans of beer per week    Comment: Daily  . Drug use: Never  . Sexual activity: Yes    Birth control/protection: None  Other Topics Concern  . Not on file  Social History Narrative  . Not on file   Social Determinants of Health   Financial Resource Strain:   . Difficulty of Paying Living Expenses:   Food Insecurity:   . Worried About Charity fundraiser in the Last Year:   . Arboriculturist in the Last Year:   Transportation Needs:   . Film/video editor (Medical):   Marland Kitchen Lack of Transportation (Non-Medical):   Physical Activity:   . Days of Exercise per Week:   . Minutes of Exercise per Session:   Stress:   . Feeling of Stress :   Social Connections:   . Frequency of Communication with Friends and Family:   . Frequency of Social Gatherings with Friends and Family:   . Attends Religious Services:   . Active Member of Clubs or Organizations:   . Attends Archivist Meetings:   Marland Kitchen Marital Status:   Intimate Partner Violence:   . Fear of Current or Ex-Partner:   . Emotionally Abused:   Marland Kitchen Physically Abused:   . Sexually Abused:     FAMILY HISTORY: Family History  Problem Relation Age of Onset  . Prostate cancer Neg Hx   .  Kidney cancer Neg Hx   . Bladder Cancer Neg Hx     ALLERGIES:  has No Known Allergies.  MEDICATIONS:  Current Outpatient Medications  Medication Sig Dispense Refill  . amLODipine (NORVASC) 5 MG tablet TAKE 1 TABLET BY MOUTH ONCE A DAY 90 tablet 3  . aspirin EC 81 MG tablet Take 81 mg by mouth daily.     Marland Kitchen azithromycin (ZITHROMAX) 250 MG tablet As directed 6 tablet 0  . docusate sodium (COLACE) 50 MG capsule Take 50 mg by mouth at bedtime.    . fexofenadine (ALLEGRA) 180 MG tablet Take 180 mg by mouth daily.    . fluticasone (FLONASE) 50 MCG/ACT nasal spray Place 2 sprays into both nostrils daily as needed for allergies or rhinitis. 11.1 mL 6  .  HYDROcodone-acetaminophen (NORCO/VICODIN) 5-325 MG tablet Take 1 tablet by mouth every 8 (eight) hours as needed for moderate pain. 30 tablet 0  . levothyroxine (SYNTHROID) 88 MCG tablet Take 1 tablet (88 mcg total) by mouth daily before breakfast. Empty stomach- 1 hour prior to breakfast. 90 tablet 3  . Multiple Vitamin (MULTIVITAMIN WITH MINERALS) TABS tablet Take 1 tablet by mouth daily.     Marland Kitchen oxybutynin (DITROPAN) 5 MG tablet Take 1 tablet by mouth 3 (three) times daily as needed.    . prednisoLONE acetate (PRED FORTE) 1 % ophthalmic suspension Place 1 drop into both eyes daily.    . tamsulosin (FLOMAX) 0.4 MG CAPS capsule Take 1 capsule (0.4 mg total) by mouth at bedtime. 90 capsule 1   No current facility-administered medications for this visit.      Marland Kitchen  PHYSICAL EXAMINATION: ECOG PERFORMANCE STATUS: 1 - Symptomatic but completely ambulatory  Vitals:   12/10/19 0911  BP: (!) 131/51  Pulse: (!) 55  Resp: 16  Temp: (!) 96 F (35.6 C)  SpO2: 100%   Filed Weights   12/10/19 0911  Weight: 120 lb (54.4 kg)    Physical Exam Constitutional:      Comments: Walking himself.  Thin built moderately nourished male patient.  HENT:     Head: Normocephalic and atraumatic.     Mouth/Throat:     Pharynx: No oropharyngeal exudate.  Eyes:     Pupils: Pupils are equal, round, and reactive to light.     Comments: Chronic drooping of the left eyelid.  Cardiovascular:     Rate and Rhythm: Normal rate and regular rhythm.  Pulmonary:     Effort: No respiratory distress.     Breath sounds: No wheezing.  Abdominal:     General: Bowel sounds are normal. There is no distension.     Palpations: Abdomen is soft. There is no mass.     Tenderness: There is no abdominal tenderness. There is no guarding or rebound.  Musculoskeletal:        General: No tenderness. Normal range of motion.     Cervical back: Normal range of motion and neck supple.  Skin:    General: Skin is warm.  Neurological:      Mental Status: He is alert and oriented to person, place, and time.  Psychiatric:        Mood and Affect: Affect normal.      LABORATORY DATA:  I have reviewed the data as listed Lab Results  Component Value Date   WBC 6.9 12/10/2019   HGB 9.1 (L) 12/10/2019   HCT 27.2 (L) 12/10/2019   MCV 97.8 12/10/2019   PLT 206 12/10/2019   Recent  Labs    11/03/19 0827 11/17/19 0830 12/10/19 0835  NA 141 139 137  K 4.1 3.9 4.0  CL 111 109 107  CO2 23 23 21*  GLUCOSE 141* 74 130*  BUN 39* 36* 28*  CREATININE 2.19* 2.27* 2.36*  CALCIUM 9.1 9.2 9.0  GFRNONAA 26* 25* 24*  GFRAA 31* 29* 28*  PROT 7.0 7.2 7.2  ALBUMIN 4.1 4.1 4.1  AST _0 ALT _1 ALKPHOS 63 66 65  BILITOT 0.6 0.7 0.6    RADIOGRAPHIC STUDIES: I have personally reviewed the radiological images as listed and agreed with the findings in the report. CT Abdomen Pelvis Wo Contrast  Result Date: 12/09/2019 CLINICAL DATA:  Follow-up bladder cancer. EXAM: CT CHEST, ABDOMEN AND PELVIS WITHOUT CONTRAST TECHNIQUE: Multidetector CT imaging of the chest, abdomen and pelvis was performed following the standard protocol without IV contrast. COMPARISON:  07/25/2019 FINDINGS: CT CHEST FINDINGS Cardiovascular: Heart size appears within normal limits. No pericardial effusion. Aortic atherosclerosis. Left main, lad, left circumflex and RCA coronary artery calcifications. Mediastinum/Nodes: The trachea appears patent and is midline. Normal appearance of the esophagus. No enlarged mediastinal, supraclavicular, or axillary adenopathy. Lungs/Pleura: No pleural effusion, airspace consolidation or atelectasis. Basilar predominant subpleural reticulation and banding identified. Mild traction bronchiectasis with peripheral areas of ground-glass attenuation noted. Bilateral calcified pleural plaques. Unchanged 2 mm right upper lobe lung nodule, image 67/3. 3 mm left lower lobe lung nodule is also unchanged, image 135/3. Calcified  granuloma noted in the left lower lobe, image 119/3. No new pulmonary nodules. Musculoskeletal: Spondylosis identified within the thoracic spine. No aggressive lytic or sclerotic bone lesions identified. CT ABDOMEN PELVIS FINDINGS Hepatobiliary: Liver unremarkable. Gallbladder appears normal. No biliary dilatation. Pancreas: Unremarkable. No pancreatic ductal dilatation or surrounding inflammatory changes. Spleen: Normal in size without focal abnormality. Adrenals/Urinary Tract: Normal appearance of the adrenal glands. Bilateral kidney cysts are again noted, incompletely characterized without IV contrast. Unchanged hyperdense left mid kidney lesion measuring 1 cm, image 70/2. Pelvocaliectasis is noted bilaterally. The ureters however appear decompressed. Mild bladder wall thickening. Stomach/Bowel: Stomach is within normal limits. Appendix appears normal. No evidence of bowel wall thickening, distention, or inflammatory changes. Vascular/Lymphatic: Aortic atherosclerosis. No aneurysm. No abdominopelvic adenopathy. Reproductive: Prostate gland enlargement. Other: No free fluid or fluid collections. Musculoskeletal: Degenerative changes noted within the lumbar spine. No aggressive lytic or sclerotic bone lesions. IMPRESSION: 1. Stable CT of the chest, abdomen and pelvis. No specific findings identified to suggest residual or recurrence of tumor or metastatic disease. 2. Small nonspecific pulmonary nodules are unchanged from previous exam. 3. Aortic atherosclerosis and 3 vessel coronary artery calcifications. 4. Bilateral kidney cysts, incompletely characterized without IV contrast. 5. Prostate gland enlargement. 6. Mild bladder wall thickening with bilateral pelvocaliectasis. Similar to prior exam. 7. Aortic atherosclerosis. Aortic Atherosclerosis (ICD10-I70.0). Electronically Signed   By: Kerby Moors M.D.   On: 12/09/2019 13:45   CT CHEST WO CONTRAST  Result Date: 12/09/2019 CLINICAL DATA:  Follow-up bladder  cancer. EXAM: CT CHEST, ABDOMEN AND PELVIS WITHOUT CONTRAST TECHNIQUE: Multidetector CT imaging of the chest, abdomen and pelvis was performed following the standard protocol without IV contrast. COMPARISON:  07/25/2019 FINDINGS: CT CHEST FINDINGS Cardiovascular: Heart size appears within normal limits. No pericardial effusion. Aortic atherosclerosis. Left main, lad, left circumflex and RCA coronary artery calcifications. Mediastinum/Nodes: The trachea appears patent and is midline. Normal appearance of the esophagus. No enlarged mediastinal, supraclavicular, or axillary adenopathy. Lungs/Pleura: No pleural effusion, airspace consolidation or atelectasis. Basilar  predominant subpleural reticulation and banding identified. Mild traction bronchiectasis with peripheral areas of ground-glass attenuation noted. Bilateral calcified pleural plaques. Unchanged 2 mm right upper lobe lung nodule, image 67/3. 3 mm left lower lobe lung nodule is also unchanged, image 135/3. Calcified granuloma noted in the left lower lobe, image 119/3. No new pulmonary nodules. Musculoskeletal: Spondylosis identified within the thoracic spine. No aggressive lytic or sclerotic bone lesions identified. CT ABDOMEN PELVIS FINDINGS Hepatobiliary: Liver unremarkable. Gallbladder appears normal. No biliary dilatation. Pancreas: Unremarkable. No pancreatic ductal dilatation or surrounding inflammatory changes. Spleen: Normal in size without focal abnormality. Adrenals/Urinary Tract: Normal appearance of the adrenal glands. Bilateral kidney cysts are again noted, incompletely characterized without IV contrast. Unchanged hyperdense left mid kidney lesion measuring 1 cm, image 70/2. Pelvocaliectasis is noted bilaterally. The ureters however appear decompressed. Mild bladder wall thickening. Stomach/Bowel: Stomach is within normal limits. Appendix appears normal. No evidence of bowel wall thickening, distention, or inflammatory changes. Vascular/Lymphatic:  Aortic atherosclerosis. No aneurysm. No abdominopelvic adenopathy. Reproductive: Prostate gland enlargement. Other: No free fluid or fluid collections. Musculoskeletal: Degenerative changes noted within the lumbar spine. No aggressive lytic or sclerotic bone lesions. IMPRESSION: 1. Stable CT of the chest, abdomen and pelvis. No specific findings identified to suggest residual or recurrence of tumor or metastatic disease. 2. Small nonspecific pulmonary nodules are unchanged from previous exam. 3. Aortic atherosclerosis and 3 vessel coronary artery calcifications. 4. Bilateral kidney cysts, incompletely characterized without IV contrast. 5. Prostate gland enlargement. 6. Mild bladder wall thickening with bilateral pelvocaliectasis. Similar to prior exam. 7. Aortic atherosclerosis. Aortic Atherosclerosis (ICD10-I70.0). Electronically Signed   By: Kerby Moors M.D.   On: 12/09/2019 13:45    ASSESSMENT & PLAN:   Cancer of overlapping sites of bladder ([place]) # High-grade transitional cell carcinoma of the bladder metastatic to retroperitoneal lymph node.  Stage IV;JUNE 29th 2021-  CT-chest and pelvis-noncontrast- negative for recurrence; prostatomegaly /thickening at the GE junction  [see below]. chronic thickening of bladder stable.  Currently on opdivo. STABLE.   #Proceed with Opdivo today. Labs today reviewed;  acceptable for treatment today.   # Iatrogenic hypothyroidism-on Synthroid 88 mcg; STABLE;TSH May 24th 2021-0.3. check Thyroid profile.   # Enlarged prostate-on CT asymptomatic [Dr.Stoiff]; PSA-0.95-normal.  Stable  # Anemia sec to CKD/ on Iv iron.Hb-9.0 plan Venofer today. # Dysphagia- intermittent- s/p EGD- no malignancy on path; ? Gastrtitis; on PPI.  # weight loss- see above; recommend protein shakes/boost.  # CKD stage IV-GFR 26=STABLE.   # DISPOSITION:  # treatment today; venofer # Follow-up in 2 weeks- -MD labs-cbc/cmp;thyroid profile; Venofer/opdivo IV; -Dr.B  # I reviewed the  blood work- with the patient in detail; also reviewed the imaging independently [as summarized above]; and with the patient in detail.     All questions were answered. The patient knows to call the clinic with any problems, questions or concerns.    Cammie Sickle, MD 12/10/2019 8:38 PM

## 2019-12-11 LAB — THYROID PANEL
Free Thyroxine Index: 2.4 (ref 1.2–4.9)
T3 Uptake Ratio: 31 % (ref 24–39)
T4, Total: 7.6 ug/dL (ref 4.5–12.0)

## 2019-12-23 ENCOUNTER — Encounter: Payer: Self-pay | Admitting: Internal Medicine

## 2019-12-23 NOTE — Progress Notes (Signed)
Pt coming in for follow up. Reports that he continues to struggle with appetite, however is forcing himself to eat 3 meals a day. Reports not using Boost or Ensure, however likes chocolate milk. Offered to give him samples of chocolate drinks tomorrow at appt, and pt would like that. No complaints or concerns today.

## 2019-12-24 ENCOUNTER — Other Ambulatory Visit: Payer: Self-pay

## 2019-12-24 ENCOUNTER — Inpatient Hospital Stay: Payer: Medicare HMO | Attending: Internal Medicine

## 2019-12-24 ENCOUNTER — Inpatient Hospital Stay: Payer: Medicare HMO | Admitting: Internal Medicine

## 2019-12-24 ENCOUNTER — Inpatient Hospital Stay: Payer: Medicare HMO

## 2019-12-24 VITALS — BP 118/58 | HR 46 | Resp 16

## 2019-12-24 DIAGNOSIS — E032 Hypothyroidism due to medicaments and other exogenous substances: Secondary | ICD-10-CM | POA: Insufficient documentation

## 2019-12-24 DIAGNOSIS — Z79899 Other long term (current) drug therapy: Secondary | ICD-10-CM | POA: Diagnosis not present

## 2019-12-24 DIAGNOSIS — D631 Anemia in chronic kidney disease: Secondary | ICD-10-CM | POA: Diagnosis not present

## 2019-12-24 DIAGNOSIS — I10 Essential (primary) hypertension: Secondary | ICD-10-CM | POA: Insufficient documentation

## 2019-12-24 DIAGNOSIS — Z87891 Personal history of nicotine dependence: Secondary | ICD-10-CM | POA: Insufficient documentation

## 2019-12-24 DIAGNOSIS — N400 Enlarged prostate without lower urinary tract symptoms: Secondary | ICD-10-CM | POA: Insufficient documentation

## 2019-12-24 DIAGNOSIS — Z7982 Long term (current) use of aspirin: Secondary | ICD-10-CM | POA: Diagnosis not present

## 2019-12-24 DIAGNOSIS — C678 Malignant neoplasm of overlapping sites of bladder: Secondary | ICD-10-CM

## 2019-12-24 DIAGNOSIS — Z5112 Encounter for antineoplastic immunotherapy: Secondary | ICD-10-CM | POA: Insufficient documentation

## 2019-12-24 DIAGNOSIS — R1310 Dysphagia, unspecified: Secondary | ICD-10-CM | POA: Insufficient documentation

## 2019-12-24 DIAGNOSIS — N184 Chronic kidney disease, stage 4 (severe): Secondary | ICD-10-CM | POA: Insufficient documentation

## 2019-12-24 DIAGNOSIS — Z7189 Other specified counseling: Secondary | ICD-10-CM

## 2019-12-24 LAB — CBC WITH DIFFERENTIAL/PLATELET
Abs Immature Granulocytes: 0.02 10*3/uL (ref 0.00–0.07)
Basophils Absolute: 0.1 10*3/uL (ref 0.0–0.1)
Basophils Relative: 1 %
Eosinophils Absolute: 1.2 10*3/uL — ABNORMAL HIGH (ref 0.0–0.5)
Eosinophils Relative: 18 %
HCT: 26.3 % — ABNORMAL LOW (ref 39.0–52.0)
Hemoglobin: 8.9 g/dL — ABNORMAL LOW (ref 13.0–17.0)
Immature Granulocytes: 0 %
Lymphocytes Relative: 32 %
Lymphs Abs: 2.2 10*3/uL (ref 0.7–4.0)
MCH: 32.8 pg (ref 26.0–34.0)
MCHC: 33.8 g/dL (ref 30.0–36.0)
MCV: 97 fL (ref 80.0–100.0)
Monocytes Absolute: 0.5 10*3/uL (ref 0.1–1.0)
Monocytes Relative: 7 %
Neutro Abs: 2.9 10*3/uL (ref 1.7–7.7)
Neutrophils Relative %: 42 %
Platelets: 201 10*3/uL (ref 150–400)
RBC: 2.71 MIL/uL — ABNORMAL LOW (ref 4.22–5.81)
RDW: 12.8 % (ref 11.5–15.5)
WBC: 6.9 10*3/uL (ref 4.0–10.5)
nRBC: 0 % (ref 0.0–0.2)

## 2019-12-24 LAB — COMPREHENSIVE METABOLIC PANEL
ALT: 13 U/L (ref 0–44)
AST: 20 U/L (ref 15–41)
Albumin: 3.8 g/dL (ref 3.5–5.0)
Alkaline Phosphatase: 64 U/L (ref 38–126)
Anion gap: 8 (ref 5–15)
BUN: 35 mg/dL — ABNORMAL HIGH (ref 8–23)
CO2: 22 mmol/L (ref 22–32)
Calcium: 8.7 mg/dL — ABNORMAL LOW (ref 8.9–10.3)
Chloride: 106 mmol/L (ref 98–111)
Creatinine, Ser: 2.14 mg/dL — ABNORMAL HIGH (ref 0.61–1.24)
GFR calc Af Amer: 32 mL/min — ABNORMAL LOW (ref >60)
GFR calc non Af Amer: 27 mL/min — ABNORMAL LOW (ref >60)
Glucose, Bld: 140 mg/dL — ABNORMAL HIGH (ref 70–99)
Potassium: 4.8 mmol/L (ref 3.5–5.1)
Sodium: 136 mmol/L (ref 135–145)
Total Bilirubin: 0.7 mg/dL (ref 0.3–1.2)
Total Protein: 6.9 g/dL (ref 6.5–8.1)

## 2019-12-24 MED ORDER — SODIUM CHLORIDE 0.9 % IV SOLN
Freq: Once | INTRAVENOUS | Status: AC
  Filled 2019-12-24: qty 250

## 2019-12-24 MED ORDER — SODIUM CHLORIDE 0.9 % IV SOLN
240.0000 mg | Freq: Once | INTRAVENOUS | Status: AC
  Administered 2019-12-24: 240 mg via INTRAVENOUS
  Filled 2019-12-24: qty 24

## 2019-12-24 NOTE — Assessment & Plan Note (Addendum)
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 chronic thickening of bladder stable.  Currently on opdivo. STABLE.   #Proceed with Opdivo today. Labs today reviewed;  acceptable for treatment today.   # Iatrogenic hypothyroidism-on Synthroid 88 mcg; STABLE. TSH June 2021- WNL.   # Enlarged prostate-on CT asymptomatic [Dr.Stoiff]; PSA-0.95-normal. STABLE.   # Anemia sec to CKD/ on Iv iron.Hb-9.0; iron sats- 39% [June 2021]; HOLD venofer today  # Dysphagia- intermittent- s/p EGD- no malignancy on path; ? Gastrtitis; on PPI- Better.   # weight loss-STABLE;  see above; recommend protein shakes/boost.  # CKD stage IV-GFR 26=STABLE.   # DISPOSITION:  # treatment today; NO venofer # Follow-up in 2 weeks- -MD labs-cbc/cmp;;opdivo IV; -Dr.B

## 2019-12-24 NOTE — Progress Notes (Signed)
[place] NOTE  Patient Care Team: [name], [name], [name], [name]; [name]; [name]; mediastinal uptake; right pubic rami uptake.  # [name]; Jan 18th 2021- switched to [place]- [pt preference; [name]; [name]; [name]  ;GOALS: palliative  CURRENT/MOST RECENT THERAPY:OPDIVO [C]     Cancer of overlapping sites of bladder ([place])  02/28/2018 - 06/29/2019 Chemotherapy   The patient had atezolizumab (TECENTRIQ) 1,[name], 1,200 mg, Intravenous, Once, [name],200 mg (02/28/2018), 1,200 mg (03/21/2018), 1,200 mg (04/11/2018), 1,200 mg (05/02/2018), 1,200 mg (06/13/2018), 1,200 mg (05/23/2018), 1,200 mg (07/04/2018), 1,200 mg (07/25/2018), 1,200 mg (08/15/2018), 1,200 mg (09/05/2018), 1,200 mg (10/25/2018), 1,200 mg (11/22/2018), 1,200 mg (12/20/2018), 1,200 mg (01/10/2019), 1,200 mg (01/31/2019), 1,200 mg (02/21/2019), 1,200 mg (03/14/2019), 1,200 mg (04/04/2019), 1,200 mg (04/25/2019), 1,200 mg (05/16/2019), 1,200 mg (06/09/2019)  for chemotherapy treatment.    06/30/2019 -  Chemotherapy   The patient had nivolumab (OPDIVO) 240 mg in sodium chloride 0.9 %  100 mL chemo infusion, 240 mg, Intravenous, Once, 11 of 13 cycles Administration: 240 mg (06/30/2019), 240 mg (07/14/2019), 240 mg (07/28/2019), 240 mg (08/11/2019), 240 mg (08/25/2019), 240 mg (09/08/2019), 240 mg (09/22/2019), 240 mg (11/03/2019), 240 mg (11/17/2019), 240 mg (12/10/2019), 240 mg (12/24/2019)  for chemotherapy treatment.     HISTORY OF PRESENTING ILLNESS: Edward Cummings 86 y.o.  male with metastatic transitional carcinoma of the bladder currently on Opdivo is here for follow-up.   Patient seems to be coping his wife loss fairly well.  However complains of poor appetite.  No nausea no vomiting.  No abdominal pain.  No blood in stool or urine.  Review of Systems  Constitutional: Positive for malaise/fatigue. Negative for chills, [name], hemoptysis, sputum production and wheezing.   Cardiovascular: Negative for chest pain, palpitations, orthopnea and leg swelling.  Gastrointestinal: Negative for abdominal pain, blood in stool, diarrhea, heartburn, melena, nausea and vomiting.  Genitourinary: Negative for dysuria, frequency and urgency.  Musculoskeletal: Positive for back pain. Negative for joint pain.  Skin: Negative.  Negative for itching and rash.  Neurological: Negative for tingling, focal weakness, weakness and headaches.  Endo/Heme/Allergies: Does not bruise/bleed easily.  Psychiatric/Behavioral: Negative for depression. The patient is not nervous/anxious and does not have insomnia.      MEDICAL HISTORY:  Past Medical History:  Diagnosis Date  . Anemia   . Cancer ([place])    bladder  . Chronic kidney disease   . Depression   . Hypertension   . Neuromuscular disorder ([place])    Nerve damage to left face/eye since around 2002.    SURGICAL HISTORY:  Past Surgical History:  Procedure Laterality Date  . CYSTOSCOPY  W/ RETROGRADES Bilateral 01/25/2018   Procedure: CYSTOSCOPY WITH RETROGRADE PYELOGRAM;  [name], MD;  [name];  Service: Urology;  Laterality: Bilateral;  . CYSTOSCOPY W/ URETERAL STENT PLACEMENT Bilateral 01/06/2019   Procedure: CYSTOSCOPY WITH RETROGRADE PYELOGRAM/URETERAL STENT REMOVAL;  [name], MD;  [name];  Service: Urology;  Laterality: Bilateral;  . CYSTOSCOPY WITH STENT PLACEMENT Bilateral 01/25/2018   Procedure: CYSTOSCOPY WITH STENT PLACEMENT;  [name], MD;  [name];  Service: Urology;  Laterality: Bilateral;  . DORSAL SLIT N/A 01/06/2019   Procedure: DORSAL SLIT;  [name], MD;  [name];  Service: Urology;  [name];  . ESOPHAGOGASTRODUODENOSCOPY (EGD) WITH PROPOFOL N/A 11/12/2019   Procedure: ESOPHAGOGASTRODUODENOSCOPY (EGD) WITH PROPOFOL;  Surgeon: Virgel Manifold, MD;  Location: [facility] END[facility]COPY;  Service: Endoscopy;  [name];  . EYE SURGERY     Cornea transplants bilaterally & cataract surgery.  . TRANSURETHRAL RESECTION OF BLADDER TUMOR N/A 01/25/2018   Procedure: TRANSURETHRAL RESECTION OF BLADDER TUMOR (TURBT);  [name], MD;  [name];  Service: Urology;  [name];    SOCIAL HISTORY: lives in [place]; with wife; quit smoking 18 years ago; beer every 2 months or so.mechanic/retd.   Social History   Socioeconomic History  . Marital status: Married    Spouse name: [name]  . Number of children: Not on file  . Years of education: Not on file  . Highest education level: Not on file  Occupational History  . Not on file  Tobacco Use  . Smoking status: Former Smoker    Types: Cigarettes  . Smokeless tobacco: Current User    Types: Chew  . Tobacco comment: Stopped approximately 10 years ago.  Vaping Use  . Vaping Use: Never used  Substance and Sexual Activity  . Alcohol use: Not Currently    Alcohol/week:  2.0 standard drinks    Types: 2 Cans of beer per week    Comment: Daily  . Drug use: Never  . Sexual activity: Yes    Birth control/protection: None  Other Topics Concern  . Not on file  Social History Narrative  . Not on file   Social Determinants of Health   Financial Resource Strain:   . Difficulty of Paying Living Expenses:   Food Insecurity:   . Worried About Charity fundraiser in the Last Year:   . Arboriculturist in the Last Year:   Transportation Needs:   . Film/video editor (Medical):   Marland Kitchen Lack of Transportation (Non-Medical):   Physical Activity:   . Days of Exercise per Week:   . Minutes of Exercise per Session:   Stress:   . Feeling of Stress :   Social Connections:   . Frequency of Communication with Friends and Family:   . Frequency of Social Gatherings with Friends and Family:   . Attends Religious Services:   . Active Member of Clubs or Organizations:   . Attends Archivist Meetings:   Marland Kitchen Marital Status:   Intimate Partner Violence:   . Fear of Current or Ex-Partner:   . Emotionally Abused:   Marland Kitchen Physically Abused:   . Sexually Abused:     FAMILY HISTORY: Family History  Problem Relation Age of Onset  . Prostate cancer Neg Hx   . Kidney cancer Neg Hx   . Bladder Cancer  Neg Hx     ALLERGIES:  has No Known Allergies.  MEDICATIONS:  Current Outpatient Medications  Medication Sig Dispense Refill  . amLODipine (NORVASC) 5 MG tablet TAKE 1 TABLET BY MOUTH ONCE A DAY 90 tablet 3  . aspirin EC 81 MG tablet Take 81 mg by mouth daily.     Marland Kitchen azithromycin (ZITHROMAX) 250 MG tablet As directed 6 tablet 0  . docusate sodium (COLACE) 50 MG capsule Take 50 mg by mouth at bedtime.    . fexofenadine (ALLEGRA) 180 MG tablet Take 180 mg by mouth daily.    . fluticasone (FLONASE) 50 MCG/ACT nasal spray Place 2 sprays into both nostrils daily as needed for allergies or rhinitis. 11.1 mL 6  . HYDROcodone-acetaminophen (NORCO/VICODIN) 5-325 MG tablet  Take 1 tablet by mouth every 8 (eight) hours as needed for moderate pain. 30 tablet 0  . levothyroxine (SYNTHROID) 88 MCG tablet Take 1 tablet (88 mcg total) by mouth daily before breakfast. Empty stomach- 1 hour prior to breakfast. 90 tablet 3  . Multiple Vitamin (MULTIVITAMIN WITH MINERALS) TABS tablet Take 1 tablet by mouth daily.     Marland Kitchen oxybutynin (DITROPAN) 5 MG tablet Take 1 tablet by mouth 3 (three) times daily as needed.    . prednisoLONE acetate (PRED FORTE) 1 % ophthalmic suspension Place 1 drop into both eyes daily.    . tamsulosin (FLOMAX) 0.4 MG CAPS capsule Take 1 capsule (0.4 mg total) by mouth at bedtime. 90 capsule 1   No current facility-administered medications for this visit.      Marland Kitchen  PHYSICAL EXAMINATION: ECOG PERFORMANCE STATUS: 1 - Symptomatic but completely ambulatory  Vitals:   12/24/19 0845  BP: 137/62  Pulse: (!) 47  Resp: 18  Temp: (!) 97.4 F (36.3 C)   Filed Weights   12/24/19 0855  Weight: 123 lb (55.8 kg)    Physical Exam Constitutional:      Comments: Walking himself.  Thin built moderately nourished male patient.  HENT:     Head: Normocephalic and atraumatic.     Mouth/Throat:     Pharynx: No oropharyngeal exudate.  Eyes:     Pupils: Pupils are equal, round, and reactive to light.     Comments: Chronic drooping of the left eyelid.  Cardiovascular:     Rate and Rhythm: Normal rate and regular rhythm.  Pulmonary:     Effort: No respiratory distress.     Breath sounds: No wheezing.  Abdominal:     General: Bowel sounds are normal. There is no distension.     Palpations: Abdomen is soft. There is no mass.     Tenderness: There is no abdominal tenderness. There is no guarding or rebound.  Musculoskeletal:        General: No tenderness. Normal range of motion.     Cervical back: Normal range of motion and neck supple.  Skin:    General: Skin is warm.  Neurological:     Mental Status: He is alert and oriented to person, place, and time.   Psychiatric:        Mood and Affect: Affect normal.      LABORATORY DATA:  I have reviewed the data as listed Lab Results  Component Value Date   WBC 6.9 12/24/2019   HGB 8.9 (L) 12/24/2019   HCT 26.3 (L) 12/24/2019   MCV 97.0 12/24/2019   PLT 201 12/24/2019   Recent Labs    11/17/19 0830 12/10/19 0835 12/24/19 0831  NA 139  137 136  K 3.9 4.0 4.8  CL 109 107 106  CO2 23 21* 22  GLUCOSE 74 130* 140*  BUN 36* 28* 35*  CREATININE 2.27* 2.36* 2.14*  CALCIUM 9.2 9.0 8.7*  GFRNONAA 25* 24* 27*  GFRAA 29* 28* 32*  PROT 7.2 7.2 6.9  ALBUMIN 4.1 4.1 3.8  AST '21 20 20  ' ALT '18 13 13  ' ALKPHOS 66 65 64  BILITOT 0.7 0.6 0.7    RADIOGRAPHIC STUDIES: I have personally reviewed the radiological images as listed and agreed with the findings in the report. CT Abdomen Pelvis Wo Contrast  Result Date: 12/09/2019 CLINICAL DATA:  Follow-up bladder cancer. EXAM: CT CHEST, ABDOMEN AND PELVIS WITHOUT CONTRAST TECHNIQUE: Multidetector CT imaging of the chest, abdomen and pelvis was performed following the standard protocol without IV contrast. COMPARISON:  07/25/2019 FINDINGS: CT CHEST FINDINGS Cardiovascular: Heart size appears within normal limits. No pericardial effusion. Aortic atherosclerosis. Left main, lad, left circumflex and RCA coronary artery calcifications. Mediastinum/Nodes: The trachea appears patent and is midline. Normal appearance of the esophagus. No enlarged mediastinal, supraclavicular, or axillary adenopathy. Lungs/Pleura: No pleural effusion, airspace consolidation or atelectasis. Basilar predominant subpleural reticulation and banding identified. Mild traction bronchiectasis with peripheral areas of ground-glass attenuation noted. Bilateral calcified pleural plaques. Unchanged 2 mm right upper lobe lung nodule, image 67/3. 3 mm left lower lobe lung nodule is also unchanged, image 135/3. Calcified granuloma noted in the left lower lobe, image 119/3. No new pulmonary nodules.  Musculoskeletal: Spondylosis identified within the thoracic spine. No aggressive lytic or sclerotic bone lesions identified. CT ABDOMEN PELVIS FINDINGS Hepatobiliary: Liver unremarkable. Gallbladder appears normal. No biliary dilatation. Pancreas: Unremarkable. No pancreatic ductal dilatation or surrounding inflammatory changes. Spleen: Normal in size without focal abnormality. Adrenals/Urinary Tract: Normal appearance of the adrenal glands. Bilateral kidney cysts are again noted, incompletely characterized without IV contrast. Unchanged hyperdense left mid kidney lesion measuring 1 cm, image 70/2. Pelvocaliectasis is noted bilaterally. The ureters however appear decompressed. Mild bladder wall thickening. Stomach/Bowel: Stomach is within normal limits. Appendix appears normal. No evidence of bowel wall thickening, distention, or inflammatory changes. Vascular/Lymphatic: Aortic atherosclerosis. No aneurysm. No abdominopelvic adenopathy. Reproductive: Prostate gland enlargement. Other: No free fluid or fluid collections. Musculoskeletal: Degenerative changes noted within the lumbar spine. No aggressive lytic or sclerotic bone lesions. IMPRESSION: 1. Stable CT of the chest, abdomen and pelvis. No specific findings identified to suggest residual or recurrence of tumor or metastatic disease. 2. Small nonspecific pulmonary nodules are unchanged from previous exam. 3. Aortic atherosclerosis and 3 vessel coronary artery calcifications. 4. Bilateral kidney cysts, incompletely characterized without IV contrast. 5. Prostate gland enlargement. 6. Mild bladder wall thickening with bilateral pelvocaliectasis. Similar to prior exam. 7. Aortic atherosclerosis. Aortic Atherosclerosis (ICD10-I70.0). Electronically Signed   By: Kerby Moors M.D.   On: 12/09/2019 13:45   CT CHEST WO CONTRAST  Result Date: 12/09/2019 CLINICAL DATA:  Follow-up bladder cancer. EXAM: CT CHEST, ABDOMEN AND PELVIS WITHOUT CONTRAST TECHNIQUE:  Multidetector CT imaging of the chest, abdomen and pelvis was performed following the standard protocol without IV contrast. COMPARISON:  07/25/2019 FINDINGS: CT CHEST FINDINGS Cardiovascular: Heart size appears within normal limits. No pericardial effusion. Aortic atherosclerosis. Left main, lad, left circumflex and RCA coronary artery calcifications. Mediastinum/Nodes: The trachea appears patent and is midline. Normal appearance of the esophagus. No enlarged mediastinal, supraclavicular, or axillary adenopathy. Lungs/Pleura: No pleural effusion, airspace consolidation or atelectasis. Basilar predominant subpleural reticulation and banding identified. Mild traction bronchiectasis with peripheral areas of  ground-glass attenuation noted. Bilateral calcified pleural plaques. Unchanged 2 mm right upper lobe lung nodule, image 67/3. 3 mm left lower lobe lung nodule is also unchanged, image 135/3. Calcified granuloma noted in the left lower lobe, image 119/3. No new pulmonary nodules. Musculoskeletal: Spondylosis identified within the thoracic spine. No aggressive lytic or sclerotic bone lesions identified. CT ABDOMEN PELVIS FINDINGS Hepatobiliary: Liver unremarkable. Gallbladder appears normal. No biliary dilatation. Pancreas: Unremarkable. No pancreatic ductal dilatation or surrounding inflammatory changes. Spleen: Normal in size without focal abnormality. Adrenals/Urinary Tract: Normal appearance of the adrenal glands. Bilateral kidney cysts are again noted, incompletely characterized without IV contrast. Unchanged hyperdense left mid kidney lesion measuring 1 cm, image 70/2. Pelvocaliectasis is noted bilaterally. The ureters however appear decompressed. Mild bladder wall thickening. Stomach/Bowel: Stomach is within normal limits. Appendix appears normal. No evidence of bowel wall thickening, distention, or inflammatory changes. Vascular/Lymphatic: Aortic atherosclerosis. No aneurysm. No abdominopelvic adenopathy.  Reproductive: Prostate gland enlargement. Other: No free fluid or fluid collections. Musculoskeletal: Degenerative changes noted within the lumbar spine. No aggressive lytic or sclerotic bone lesions. IMPRESSION: 1. Stable CT of the chest, abdomen and pelvis. No specific findings identified to suggest residual or recurrence of tumor or metastatic disease. 2. Small nonspecific pulmonary nodules are unchanged from previous exam. 3. Aortic atherosclerosis and 3 vessel coronary artery calcifications. 4. Bilateral kidney cysts, incompletely characterized without IV contrast. 5. Prostate gland enlargement. 6. Mild bladder wall thickening with bilateral pelvocaliectasis. Similar to prior exam. 7. Aortic atherosclerosis. Aortic Atherosclerosis (ICD10-I70.0). Electronically Signed   By: Kerby Moors M.D.   On: 12/09/2019 13:45    ASSESSMENT & PLAN:   Cancer of overlapping sites of bladder ([place]) # High-grade transitional cell carcinoma of the bladder metastatic to retroperitoneal lymph node.  Stage IV;JUNE 29th 2021-  CT-chest and pelvis-noncontrast- negative for recurrence; prostatomegaly /thickening at the GE junction  [see below]. chronic thickening of bladder stable.  Currently on opdivo. STABLE.   #Proceed with Opdivo today. Labs today reviewed;  acceptable for treatment today.   # Iatrogenic hypothyroidism-on Synthroid 88 mcg; STABLE. TSH June 2021- WNL.   # Enlarged prostate-on CT asymptomatic [Dr.Stoiff]; PSA-0.95-normal. STABLE.   # Anemia sec to CKD/ on Iv iron.Hb-9.0; iron sats- 39% [June 2021]; HOLD venofer today  # Dysphagia- intermittent- s/p EGD- no malignancy on path; ? Gastrtitis; on PPI- Better.   # weight loss-STABLE;  see above; recommend protein shakes/boost.  # CKD stage IV-GFR 26=STABLE.   # DISPOSITION:  # treatment today; NO venofer # Follow-up in 2 weeks- -MD labs-cbc/cmp;;opdivo IV; -Dr.B     All questions were answered. The patient knows to call the clinic with any  problems, questions or concerns.    Cammie Sickle, MD 12/24/2019 10:17 PM

## 2019-12-31 ENCOUNTER — Encounter: Payer: Self-pay | Admitting: Internal Medicine

## 2019-12-31 ENCOUNTER — Ambulatory Visit (INDEPENDENT_AMBULATORY_CARE_PROVIDER_SITE_OTHER): Payer: Medicare HMO | Admitting: Internal Medicine

## 2019-12-31 ENCOUNTER — Other Ambulatory Visit: Payer: Self-pay

## 2019-12-31 VITALS — BP 127/59 | HR 62 | Ht 68.0 in | Wt 120.8 lb

## 2019-12-31 DIAGNOSIS — H6123 Impacted cerumen, bilateral: Secondary | ICD-10-CM | POA: Diagnosis not present

## 2019-12-31 DIAGNOSIS — H2513 Age-related nuclear cataract, bilateral: Secondary | ICD-10-CM

## 2019-12-31 DIAGNOSIS — R69 Illness, unspecified: Secondary | ICD-10-CM | POA: Diagnosis not present

## 2019-12-31 DIAGNOSIS — J301 Allergic rhinitis due to pollen: Secondary | ICD-10-CM | POA: Diagnosis not present

## 2019-12-31 DIAGNOSIS — F419 Anxiety disorder, unspecified: Secondary | ICD-10-CM | POA: Diagnosis not present

## 2019-12-31 HISTORY — DX: Anxiety disorder, unspecified: F41.9

## 2019-12-31 MED ORDER — LORATADINE 10 MG PO TABS: 10.0000 mg | ORAL_TABLET | Freq: Every day | ORAL | 11 refills | Status: DC

## 2019-12-31 NOTE — Assessment & Plan Note (Signed)
His wife died recently so he is very anxious. - Patient experiencing high levels of anxiety.  - Encouraged patient to engage in relaxing activities like yoga, meditation, journaling, going for a walk, or participating in a hobby.  - Encouraged patient to reach out to trusted friends or family members about recent struggles

## 2019-12-31 NOTE — Assessment & Plan Note (Signed)
Patient will be scheduled to have the wax of theear removed in 1 month

## 2019-12-31 NOTE — Progress Notes (Signed)
Established Patient Office Visit  Subjective:  Patient ID: Edward Cummings, male    DOB: Oct 31, 1932  Age: 86 y.o. MRN: [id number]  CC:  Chief Complaint  Patient presents with  . Nasal Congestion    Patient reports nasal congestion x1 month, states he has no other symptoms     HPI  Edward Cummings presents for seasonal allergic rhinitis.  He does not have a temperature headache has postnasal drip no nausea vomiting swelling of the leg shortness of breath.  Past Medical History:  Diagnosis Date  . Anemia   . Anxiety 12/31/2019  . Cancer ([place])    bladder  . Chronic kidney disease   . Depression   . Hypertension   . Neuromuscular disorder ([place])    Nerve damage to left face/eye since around 2002.    Past Surgical History:  Procedure Laterality Date  . CYSTOSCOPY W/ RETROGRADES Bilateral 01/25/2018   Procedure: CYSTOSCOPY WITH RETROGRADE PYELOGRAM;  [name], MD;  [name];  Service: Urology;  Laterality: Bilateral;  . CYSTOSCOPY W/ URETERAL STENT PLACEMENT Bilateral 01/06/2019   Procedure: CYSTOSCOPY WITH RETROGRADE PYELOGRAM/URETERAL STENT REMOVAL;  [name], MD;  [name];  Service: Urology;  Laterality: Bilateral;  . CYSTOSCOPY WITH STENT PLACEMENT Bilateral 01/25/2018   Procedure: CYSTOSCOPY WITH STENT PLACEMENT;  [name], MD;  [name];  Service: Urology;  Laterality: Bilateral;  . DORSAL SLIT N/A 01/06/2019   Procedure: DORSAL SLIT;  [name], MD;  [name];  Service: Urology;  [name];  . ESOPHAGOGASTRODUODENOSCOPY (EGD) WITH PROPOFOL N/A 11/12/2019   Procedure: ESOPHAGOGASTRODUODENOSCOPY (EGD) WITH PROPOFOL;  Surgeon: Virgel Manifold, MD;  Location: [facility];  Service: Endoscopy;  [name];  . EYE SURGERY     Cornea transplants bilaterally & cataract surgery.  . TRANSURETHRAL RESECTION OF BLADDER TUMOR N/A 01/25/2018   Procedure: TRANSURETHRAL  RESECTION OF BLADDER TUMOR (TURBT);  [name], MD;  [name];  Service: Urology;  [name];    Family History  Problem Relation Age of Onset  . Prostate cancer Neg Hx   . Kidney cancer Neg Hx   . Bladder Cancer Neg Hx     Social History   Socioeconomic History  . Marital status: Married    Spouse name: [name]  . Number of children: Not on file  . Years of education: Not on file  . Highest education level: Not on file  Occupational History  . Not on file  Tobacco Use  . Smoking status: Former Smoker    Types: Cigarettes  . Smokeless tobacco: Current User    Types: Chew  . Tobacco comment: Stopped approximately 10 years ago.  Vaping Use  . Vaping Use: Never used  Substance and Sexual Activity  . Alcohol use: Not Currently    Alcohol/week: 2.0 standard drinks    Types: 2 Cans of beer per week    Comment: Daily  . Drug use: Never  . Sexual activity: Yes    Birth control/protection: None  Other Topics Concern  . Not on file  Social History Narrative  . Not on file   Social Determinants of Health   Financial Resource Strain:   . Difficulty of Paying Living Expenses:   Food Insecurity:   . Worried About Charity fundraiser in the Last Year:   . Arboriculturist in the Last Year:   Transportation Needs:   .  Lack of Transportation (Medical):   Marland Kitchen Lack of Transportation (Non-Medical):   Physical Activity:   . Days of Exercise per Week:   . Minutes of Exercise per Session:   Stress:   . Feeling of Stress :   Social Connections:   . Frequency of Communication with Friends and Family:   . Frequency of Social Gatherings with Friends and Family:   . Attends Religious Services:   . Active Member of Clubs or Organizations:   . Attends Archivist Meetings:   Marland Kitchen Marital Status:   Intimate Partner Violence:   . Fear of Current or Ex-Partner:   . Emotionally Abused:   Marland Kitchen Physically Abused:   . Sexually Abused:      Current  Outpatient Medications:  .  amLODipine (NORVASC) 5 MG tablet, TAKE 1 TABLET BY MOUTH ONCE A DAY, Disp: 90 tablet, Rfl: 3 .  aspirin EC 81 MG tablet, Take 81 mg by mouth daily. , Disp: , Rfl:  .  docusate sodium (COLACE) 50 MG capsule, Take 50 mg by mouth at bedtime., Disp: , Rfl:  .  fexofenadine (ALLEGRA) 180 MG tablet, Take 180 mg by mouth daily., Disp: , Rfl:  .  fluticasone (FLONASE) 50 MCG/ACT nasal spray, Place 2 sprays into both nostrils daily as needed for allergies or rhinitis., Disp: 11.1 mL, Rfl: 6 .  HYDROcodone-acetaminophen (NORCO/VICODIN) 5-325 MG tablet, Take 1 tablet by mouth every 8 (eight) hours as needed for moderate pain., Disp: 30 tablet, Rfl: 0 .  levothyroxine (SYNTHROID) 88 MCG tablet, Take 1 tablet (88 mcg total) by mouth daily before breakfast. Empty stomach- 1 hour prior to breakfast., Disp: 90 tablet, Rfl: 3 .  Multiple Vitamin (MULTIVITAMIN WITH MINERALS) TABS tablet, Take 1 tablet by mouth daily. , Disp: , Rfl:  .  oxybutynin (DITROPAN) 5 MG tablet, Take 1 tablet by mouth 3 (three) times daily as needed., Disp: , Rfl:  .  prednisoLONE acetate (PRED FORTE) 1 % ophthalmic suspension, Place 1 drop into both eyes daily., Disp: , Rfl:  .  tamsulosin (FLOMAX) 0.4 MG CAPS capsule, Take 1 capsule (0.4 mg total) by mouth at bedtime., Disp: 90 capsule, Rfl: 1 .  loratadine (CLARITIN) 10 MG tablet, Take 1 tablet (10 mg total) by mouth daily., Disp: 30 tablet, Rfl: 11   No Known Allergies  ROS Review of Systems  Constitutional: Negative for chills, diaphoresis and fever.  HENT: Positive for congestion, sneezing and sore throat. Negative for ear pain and sinus pressure.   Eyes: Negative for redness and itching.  Respiratory: Negative for choking.   Cardiovascular: Negative for chest pain.  Gastrointestinal: Negative for abdominal distention.  Endocrine: Positive for heat intolerance. Negative for cold intolerance.  Genitourinary: Negative for difficulty urinating.    Musculoskeletal: Negative for back pain and joint swelling.  Neurological: Negative for seizures and light-headedness.  Psychiatric/Behavioral: Negative for dysphoric mood.      Objective:    Physical Exam Constitutional:      Appearance: He is normal weight.  HENT:     Head: Normocephalic.      Comments: Small amount of wax was noted in both ears.    Right Ear: There is no impacted cerumen.     Mouth/Throat:     Mouth: Mucous membranes are moist.  Eyes:     General: No scleral icterus. Cardiovascular:     Rate and Rhythm: Regular rhythm.     Heart sounds: No murmur heard.   Pulmonary:  Effort: No respiratory distress.  Musculoskeletal:        General: No swelling.     Cervical back: No rigidity.  Skin:    Coloration: Skin is not jaundiced.  Neurological:     Mental Status: He is alert.     BP (!) 127/59   Pulse 62   Ht 5\' 8"  (1.727 m)   Wt 120 lb 12.8 oz (54.8 kg)   BMI 18.37 kg/m  Wt Readings from Last 3 Encounters:  12/31/19 120 lb 12.8 oz (54.8 kg)  12/24/19 123 lb (55.8 kg)  12/10/19 120 lb (54.4 kg)     Health Maintenance Due  Topic Date Due  . COVID-19 Vaccine (1) Never done  . TETANUS/TDAP  Never done  . PNA vac Low Risk Adult (2 of 2 - PCV13) 04/09/2019    There are no preventive care reminders to display for this patient.  Lab Results  Component Value Date   TSH 0.200 (L) 09/22/2019   Lab Results  Component Value Date   WBC 6.9 12/24/2019   HGB 8.9 (L) 12/24/2019   HCT 26.3 (L) 12/24/2019   MCV 97.0 12/24/2019   PLT 201 12/24/2019   Lab Results  Component Value Date   NA 136 12/24/2019   K 4.8 12/24/2019   CO2 22 12/24/2019   GLUCOSE 140 (H) 12/24/2019   BUN 35 (H) 12/24/2019   CREATININE 2.14 (H) 12/24/2019   BILITOT 0.7 12/24/2019   ALKPHOS 64 12/24/2019   AST 20 12/24/2019   ALT 13 12/24/2019   PROT 6.9 12/24/2019   ALBUMIN 3.8 12/24/2019   CALCIUM 8.7 (L) 12/24/2019   ANIONGAP 8 12/24/2019   No results found  for: CHOL No results found for: HDL No results found for: LDLCALC No results found for: TRIG No results found for: CHOLHDL Lab Results  Component Value Date   HGBA1C 4.5 (L) 04/13/2018      Assessment & Plan:   Problem List Items Addressed This Visit      Respiratory   Seasonal allergic rhinitis due to pollen - Primary    He was started on Claritin.      Relevant Medications   loratadine (CLARITIN) 10 MG tablet     Nervous and Auditory   Cerumen debris on tympanic membrane of both ears    Patient will be scheduled to have the wax of theear removed in 1 month        Other   Anxiety    His wife died recently so he is very anxious. - Patient experiencing high levels of anxiety.  - Encouraged patient to engage in relaxing activities like yoga, meditation, journaling, going for a walk, or participating in a hobby.  - Encouraged patient to reach out to trusted friends or family members about recent struggles       RESOLVED: Nuclear sclerosis of both eyes    Patient eyesight is okay he has some tearing of the eyes due to allergy.  He is being followed with the ophthalmologist for his eye problem.         Meds ordered this encounter  Medications  . loratadine (CLARITIN) 10 MG tablet    Sig: Take 1 tablet (10 mg total) by mouth daily.    Dispense:  30 tablet    Refill:  11    Follow-up: No follow-ups on file.    [name], MD

## 2019-12-31 NOTE — Assessment & Plan Note (Signed)
He was started on Claritin.

## 2019-12-31 NOTE — Assessment & Plan Note (Signed)
Patient eyesight is okay he has some tearing of the eyes due to allergy.  He is being followed with the ophthalmologist for his eye problem.

## 2020-01-06 DIAGNOSIS — N529 Male erectile dysfunction, unspecified: Secondary | ICD-10-CM | POA: Diagnosis not present

## 2020-01-06 DIAGNOSIS — I10 Essential (primary) hypertension: Secondary | ICD-10-CM | POA: Diagnosis not present

## 2020-01-06 DIAGNOSIS — J309 Allergic rhinitis, unspecified: Secondary | ICD-10-CM | POA: Diagnosis not present

## 2020-01-06 DIAGNOSIS — N3281 Overactive bladder: Secondary | ICD-10-CM | POA: Diagnosis not present

## 2020-01-06 DIAGNOSIS — N400 Enlarged prostate without lower urinary tract symptoms: Secondary | ICD-10-CM | POA: Diagnosis not present

## 2020-01-06 DIAGNOSIS — C679 Malignant neoplasm of bladder, unspecified: Secondary | ICD-10-CM | POA: Diagnosis not present

## 2020-01-06 DIAGNOSIS — Z008 Encounter for other general examination: Secondary | ICD-10-CM | POA: Diagnosis not present

## 2020-01-06 DIAGNOSIS — G3184 Mild cognitive impairment, so stated: Secondary | ICD-10-CM | POA: Diagnosis not present

## 2020-01-06 DIAGNOSIS — K5900 Constipation, unspecified: Secondary | ICD-10-CM | POA: Diagnosis not present

## 2020-01-06 DIAGNOSIS — E039 Hypothyroidism, unspecified: Secondary | ICD-10-CM | POA: Diagnosis not present

## 2020-01-06 DIAGNOSIS — H04129 Dry eye syndrome of unspecified lacrimal gland: Secondary | ICD-10-CM | POA: Diagnosis not present

## 2020-01-08 ENCOUNTER — Inpatient Hospital Stay (HOSPITAL_BASED_OUTPATIENT_CLINIC_OR_DEPARTMENT_OTHER): Payer: Medicare HMO | Admitting: Internal Medicine

## 2020-01-08 ENCOUNTER — Other Ambulatory Visit: Payer: Self-pay

## 2020-01-08 ENCOUNTER — Encounter: Payer: Self-pay | Admitting: Internal Medicine

## 2020-01-08 ENCOUNTER — Inpatient Hospital Stay: Payer: Medicare HMO

## 2020-01-08 VITALS — BP 132/49 | HR 73 | Temp 96.5°F | Resp 16 | Ht 68.0 in | Wt 122.2 lb

## 2020-01-08 DIAGNOSIS — R350 Frequency of micturition: Secondary | ICD-10-CM | POA: Diagnosis not present

## 2020-01-08 DIAGNOSIS — Z7189 Other specified counseling: Secondary | ICD-10-CM

## 2020-01-08 DIAGNOSIS — C678 Malignant neoplasm of overlapping sites of bladder: Secondary | ICD-10-CM

## 2020-01-08 DIAGNOSIS — N400 Enlarged prostate without lower urinary tract symptoms: Secondary | ICD-10-CM

## 2020-01-08 DIAGNOSIS — Z5112 Encounter for antineoplastic immunotherapy: Secondary | ICD-10-CM | POA: Diagnosis not present

## 2020-01-08 LAB — COMPREHENSIVE METABOLIC PANEL
ALT: 13 U/L (ref 0–44)
AST: 19 U/L (ref 15–41)
Albumin: 4.2 g/dL (ref 3.5–5.0)
Alkaline Phosphatase: 65 U/L (ref 38–126)
Anion gap: 6 (ref 5–15)
BUN: 40 mg/dL — ABNORMAL HIGH (ref 8–23)
CO2: 22 mmol/L (ref 22–32)
Calcium: 9.1 mg/dL (ref 8.9–10.3)
Chloride: 107 mmol/L (ref 98–111)
Creatinine, Ser: 2.21 mg/dL — ABNORMAL HIGH (ref 0.61–1.24)
GFR calc Af Amer: 30 mL/min — ABNORMAL LOW (ref >60)
GFR calc non Af Amer: 26 mL/min — ABNORMAL LOW (ref >60)
Glucose, Bld: 120 mg/dL — ABNORMAL HIGH (ref 70–99)
Potassium: 5.3 mmol/L — ABNORMAL HIGH (ref 3.5–5.1)
Sodium: 135 mmol/L (ref 135–145)
Total Bilirubin: 0.7 mg/dL (ref 0.3–1.2)
Total Protein: 7 g/dL (ref 6.5–8.1)

## 2020-01-08 LAB — CBC WITH DIFFERENTIAL/PLATELET
Abs Immature Granulocytes: 0.01 10*3/uL (ref 0.00–0.07)
Basophils Absolute: 0.1 10*3/uL (ref 0.0–0.1)
Basophils Relative: 1 %
Eosinophils Absolute: 1.6 10*3/uL — ABNORMAL HIGH (ref 0.0–0.5)
Eosinophils Relative: 20 %
HCT: 26.7 % — ABNORMAL LOW (ref 39.0–52.0)
Hemoglobin: 8.9 g/dL — ABNORMAL LOW (ref 13.0–17.0)
Immature Granulocytes: 0 %
Lymphocytes Relative: 33 %
Lymphs Abs: 2.6 10*3/uL (ref 0.7–4.0)
MCH: 33 pg (ref 26.0–34.0)
MCHC: 33.3 g/dL (ref 30.0–36.0)
MCV: 98.9 fL (ref 80.0–100.0)
Monocytes Absolute: 0.6 10*3/uL (ref 0.1–1.0)
Monocytes Relative: 7 %
Neutro Abs: 3.1 10*3/uL (ref 1.7–7.7)
Neutrophils Relative %: 39 %
Platelets: 191 10*3/uL (ref 150–400)
RBC: 2.7 MIL/uL — ABNORMAL LOW (ref 4.22–5.81)
RDW: 12.5 % (ref 11.5–15.5)
WBC: 7.9 10*3/uL (ref 4.0–10.5)
nRBC: 0 % (ref 0.0–0.2)

## 2020-01-08 MED ORDER — SODIUM CHLORIDE 0.9 % IV SOLN
Freq: Once | INTRAVENOUS | Status: AC
  Filled 2020-01-08: qty 250

## 2020-01-08 MED ORDER — SODIUM CHLORIDE 0.9 % IV SOLN
240.0000 mg | Freq: Once | INTRAVENOUS | Status: AC
  Administered 2020-01-08: 240 mg via INTRAVENOUS
  Filled 2020-01-08: qty 24

## 2020-01-08 NOTE — Progress Notes (Signed)
[place] NOTE  Patient Care Team: [name], [name], [name], [name]; [name]; [name]; mediastinal uptake; right pubic rami uptake.  # [name]; Jan 18th 2021- switched to [place]- [pt preference; [name]; July 2020 cystoscopy-no evidence of bladder malignancy/Dr. Bernardo Heater- enlarged prostate [PSA- 0.95; 2021]  # Molecular testing- PDL-1 CPS- 20%; [name]  ;GOALS: palliative  CURRENT/MOST RECENT THERAPY:OPDIVO [C]     Cancer of overlapping sites of bladder ([place])  02/28/2018 - 06/29/2019 Chemotherapy   The patient had atezolizumab (TECENTRIQ) 1,[name], 1,200 mg, Intravenous, Once, [name],200 mg (02/28/2018), 1,200 mg (03/21/2018), 1,200 mg (04/11/2018), 1,200 mg (05/02/2018), 1,200 mg (06/13/2018), 1,200 mg (05/23/2018), 1,200 mg (07/04/2018), 1,200 mg (07/25/2018), 1,200 mg (08/15/2018), 1,200 mg (09/05/2018), 1,200 mg (10/25/2018), 1,200 mg (11/22/2018), 1,200 mg (12/20/2018), 1,200 mg (01/10/2019), 1,200 mg (01/31/2019), 1,200 mg (02/21/2019), 1,200 mg (03/14/2019), 1,200 mg (04/04/2019), 1,200 mg (04/25/2019), 1,200 mg (05/16/2019), 1,200 mg (06/09/2019)  for chemotherapy treatment.    06/30/2019 -  Chemotherapy   The patient had nivolumab  (OPDIVO) 240 mg in sodium chloride 0.9 % 100 mL chemo infusion, 240 mg, Intravenous, Once, 12 of 13 cycles Administration: 240 mg (06/30/2019), 240 mg (07/14/2019), 240 mg (07/28/2019), 240 mg (08/11/2019), 240 mg (08/25/2019), 240 mg (09/08/2019), 240 mg (09/22/2019), 240 mg (11/03/2019), 240 mg (11/17/2019), 240 mg (12/10/2019), 240 mg (12/24/2019), 240 mg (01/08/2020)  for chemotherapy treatment.     HISTORY OF PRESENTING ILLNESS: Edward Cummings 86 y.o.  male with metastatic transitional carcinoma of the bladder currently on Opdivo is here for follow-up.   Patient denies any nausea vomiting.  No worsening cough or shortness of breath.  No chest pain.  Mild to moderate fatigue.  States that she has difficulty sleeping for the fact that he has to go to the bathroom multiple times at night.   Review of Systems  Constitutional: Positive for malaise/fatigue. Negative for chills, [name], hemoptysis, sputum production and wheezing.   Cardiovascular: Negative for chest pain, palpitations, orthopnea and leg swelling.  Gastrointestinal: Negative for abdominal pain, blood in stool, diarrhea, heartburn, melena, nausea and vomiting.  Genitourinary: Negative for dysuria, frequency and urgency.  Musculoskeletal: Positive for back pain. Negative for joint pain.  Skin: Negative.  Negative for itching and rash.  Neurological: Negative for tingling, focal weakness, weakness and headaches.  Endo/Heme/Allergies: Does not bruise/bleed easily.  Psychiatric/Behavioral: Negative for depression. The patient is not nervous/anxious and does not have insomnia.      MEDICAL HISTORY:  Past Medical History:  Diagnosis Date  . Anemia   . Anxiety 12/31/2019  . Cancer ([place])    bladder  . Chronic kidney disease   . Depression   .  Hypertension   . Neuromuscular  disorder ([place])    Nerve damage to left face/eye since around 2002.    SURGICAL HISTORY: Past Surgical History:  Procedure Laterality Date  . CYSTOSCOPY W/ RETROGRADES Bilateral 01/25/2018   Procedure: CYSTOSCOPY WITH RETROGRADE PYELOGRAM;  [name], MD;  [name];  Service: Urology;  Laterality: Bilateral;  . CYSTOSCOPY W/ URETERAL STENT PLACEMENT Bilateral 01/06/2019   Procedure: CYSTOSCOPY WITH RETROGRADE PYELOGRAM/URETERAL STENT REMOVAL;  [name], MD;  [name];  Service: Urology;  Laterality: Bilateral;  . CYSTOSCOPY WITH STENT PLACEMENT Bilateral 01/25/2018   Procedure: CYSTOSCOPY WITH STENT PLACEMENT;  [name], MD;  [name];  Service: Urology;  Laterality: Bilateral;  . DORSAL SLIT N/A 01/06/2019   Procedure: DORSAL SLIT;  [name], MD;  [name];  Service: Urology;  [name];  . ESOPHAGOGASTRODUODENOSCOPY (EGD) WITH PROPOFOL N/A 11/12/2019   Procedure: ESOPHAGOGASTRODUODENOSCOPY (EGD) WITH PROPOFOL;  Surgeon: Virgel Manifold, MD;  Location: [facility];  Service: Endoscopy;  [name];  . EYE SURGERY     Cornea transplants bilaterally & cataract surgery.  . TRANSURETHRAL RESECTION OF BLADDER TUMOR N/A 01/25/2018   Procedure: TRANSURETHRAL RESECTION OF BLADDER TUMOR (TURBT);  [name], MD;  [name];  Service: Urology;  [name];    SOCIAL HISTORY: lives in [place]; with wife; quit smoking 18 years ago; beer every 2 months or so.mechanic/retd.   Social History   Socioeconomic History  . Marital status: Married    Spouse name: [name]  . Number of children: Not on file  . Years of education: Not on file  . Highest education level: Not on file  Occupational History  . Not on file  Tobacco Use  . Smoking status: Former Smoker    Types: Cigarettes  . Smokeless tobacco: Current User    Types: Chew  . Tobacco comment:  Stopped approximately 10 years ago.  Vaping Use  . Vaping Use: Never used  Substance and Sexual Activity  . Alcohol use: Not Currently    Alcohol/week: 2.0 standard drinks    Types: 2 Cans of beer per week    Comment: Daily  . Drug use: Never  . Sexual activity: Yes    Birth control/protection: None  Other Topics Concern  . Not on file  Social History Narrative  . Not on file   Social Determinants of Health   Financial Resource Strain:   . Difficulty of Paying Living Expenses:   Food Insecurity:   . Worried About Charity fundraiser in the Last Year:   . Arboriculturist in the Last Year:   Transportation Needs:   . Film/video editor (Medical):   Marland Kitchen Lack of Transportation (Non-Medical):   Physical Activity:   . Days of Exercise per Week:   . Minutes of Exercise per Session:   Stress:   . Feeling of Stress :   Social Connections:   . Frequency of Communication with Friends and Family:   . Frequency of Social Gatherings with Friends and Family:   . Attends Religious Services:   . Active Member of Clubs or Organizations:   . Attends Archivist Meetings:   Marland Kitchen Marital Status:   Intimate Partner Violence:   . Fear of Current or Ex-Partner:   . Emotionally Abused:   Marland Kitchen Physically Abused:   . Sexually Abused:     FAMILY HISTORY: Family History  Problem Relation Age of Onset  . Prostate cancer Neg Hx   . Kidney cancer Neg Hx   . Bladder Cancer Neg Hx     ALLERGIES:  has No Known Allergies.  MEDICATIONS:  Current Outpatient Medications  Medication Sig Dispense Refill  . amLODipine (NORVASC) 5 MG tablet TAKE 1 TABLET BY MOUTH ONCE A DAY 90 tablet 3  . aspirin EC 81 MG tablet Take 81 mg by mouth daily.     Marland Kitchen docusate sodium (COLACE) 50 MG capsule Take 50 mg by mouth at bedtime.    . fluticasone (FLONASE) 50 MCG/ACT nasal spray Place 2 sprays into both nostrils daily as needed for allergies or rhinitis. 11.1 mL 6  . levothyroxine (SYNTHROID) 88 MCG tablet  Take 1 tablet (88 mcg total) by mouth daily before breakfast. Empty stomach- 1 hour prior to breakfast. 90 tablet 3  . loratadine (CLARITIN) 10 MG tablet Take 1 tablet (10 mg total) by mouth daily. 30 tablet 11  . Multiple Vitamin (MULTIVITAMIN WITH MINERALS) TABS tablet Take 1 tablet by mouth daily.     Marland Kitchen oxybutynin (DITROPAN) 5 MG tablet Take 1 tablet by mouth 3 (three) times daily as needed.    . prednisoLONE acetate (PRED FORTE) 1 % ophthalmic suspension Place 1 drop into both eyes daily.    . tamsulosin (FLOMAX) 0.4 MG CAPS capsule Take 1 capsule (0.4 mg total) by mouth at bedtime. 90 capsule 1  . fexofenadine (ALLEGRA) 180 MG tablet Take 180 mg by mouth daily. (Patient not taking: Reported on 01/07/2020)    . HYDROcodone-acetaminophen (NORCO/VICODIN) 5-325 MG tablet Take 1 tablet by mouth every 8 (eight) hours as needed for moderate pain. (Patient not taking: Reported on 01/07/2020) 30 tablet 0   No current facility-administered medications for this visit.      Marland Kitchen  PHYSICAL EXAMINATION: ECOG PERFORMANCE STATUS: 1 - Symptomatic but completely ambulatory  Vitals:   01/08/20 0838  BP: (!) 132/49  Pulse: 73  Resp: 16  Temp: (!) 96.5 F (35.8 C)  SpO2: 100%   Filed Weights   01/08/20 0838  Weight: 122 lb 3.2 oz (55.4 kg)    Physical Exam Constitutional:      Comments: Walking himself.  Thin built moderately nourished male patient.  HENT:     Head: Normocephalic and atraumatic.     Mouth/Throat:     Pharynx: No oropharyngeal exudate.  Eyes:     Pupils: Pupils are equal, round, and reactive to light.     Comments: Chronic drooping of the left eyelid.  Cardiovascular:     Rate and Rhythm: Normal rate and regular rhythm.  Pulmonary:     Effort: No respiratory distress.     Breath sounds: No wheezing.  Abdominal:     General: Bowel sounds are normal. There is no distension.     Palpations: Abdomen is soft. There is no mass.     Tenderness: There is no abdominal tenderness.  There is no guarding or rebound.  Musculoskeletal:        General: No tenderness. Normal range of motion.     Cervical back: Normal range of motion and neck supple.  Skin:    General: Skin is warm.  Neurological:     Mental Status: He is alert and oriented to person, place, and time.  Psychiatric:        Mood and Affect: Affect normal.      LABORATORY DATA:  I have reviewed the data as listed Lab Results  Component Value Date   WBC 7.9 01/08/2020   HGB 8.9 (L) 01/08/2020   HCT 26.7 (L) 01/08/2020   MCV 98.9 01/08/2020   PLT 191 01/08/2020   Recent Labs    12/10/19 0835 12/24/19 0831 01/08/20 0823  NA 137 136 135  K 4.0 4.8 5.3*  CL 107 106 107  CO2 21* 22 22  GLUCOSE 130* 140* 120*  BUN 28* 35* 40*  CREATININE 2.36* 2.14* 2.21*  CALCIUM 9.0 8.7* 9.1  GFRNONAA 24* 27* 26*  GFRAA 28* 32* 30*  PROT 7.2 6.9 7.0  ALBUMIN 4.1 3.8 4.2  AST _0 ALT _1 ALKPHOS 65 64 65  BILITOT 0.6 0.7 0.7    RADIOGRAPHIC STUDIES: I have personally reviewed the radiological images as listed and agreed with the findings in the report. No results found.  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  Stage IV;JUNE 29th 2021-  CT-chest and pelvis-noncontrast- negative for recurrence; prostatomegaly /thickening at the GE junction  [see below]. chronic thickening of bladder stable.  Currently on opdivo. STABLE>   #Proceed with Opdivo today. Labs today reviewed;  acceptable for treatment today.   # Iatrogenic hypothyroidism-on Synthroid 88 mcg;STABLE. TSH June 2021- WNL.   # Anemia sec to CKD/ on Iv iron.Hb-9.0; iron sats- 39% [June 2021]; HOLD venofer today  # Dysphagia- intermittent- s/p EGD- no malignancy on path; ? Gastrtitis; on PPI- STABLE.   # weight loss-STABLE; see above; continue  protein shakes/boost.  # CKD stage IV-GFR 26=STABLE; Mild  hyperkalemia- K-5.3; monitor for now.   # PROSTATISM: likely BPH- on flomax recommend evaluation with Dr.Stoioff  # DISPOSITION:  # refer to Dr.Stoioff re: increased urinary frequency/ enlarged prostate # treatment today;  # Follow-up in 2 weeks- -MD labs-cbc/cmp;;opdivo IV; -Dr.B      All questions were answered. The patient knows to call the clinic with any problems, questions or concerns.    Cammie Sickle, MD 01/12/2020 10:53 AM

## 2020-01-08 NOTE — Assessment & Plan Note (Addendum)
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 chronic thickening of bladder stable.  Currently on opdivo. STABLE>   #Proceed with Opdivo today. Labs today reviewed;  acceptable for treatment today.   # Iatrogenic hypothyroidism-on Synthroid 88 mcg;STABLE. TSH June 2021- WNL.   # Anemia sec to CKD/ on Iv iron.Hb-9.0; iron sats- 39% [June 2021]; HOLD venofer today  # Dysphagia- intermittent- s/p EGD- no malignancy on path; ? Gastrtitis; on PPI- STABLE.   # weight loss-STABLE; see above; continue  protein shakes/boost.  # CKD stage IV-GFR 26=STABLE; Mild hyperkalemia- K-5.3; monitor for now.   # PROSTATISM: likely BPH- on flomax recommend evaluation with Dr.Stoioff  # DISPOSITION:  # refer to Dr.Stoioff re: increased urinary frequency/ enlarged prostate # treatment today;  # Follow-up in 2 weeks- -MD labs-cbc/cmp;;opdivo IV; -Dr.B

## 2020-01-22 ENCOUNTER — Inpatient Hospital Stay: Payer: Medicare HMO | Attending: Internal Medicine

## 2020-01-22 ENCOUNTER — Encounter: Payer: Self-pay | Admitting: Internal Medicine

## 2020-01-22 ENCOUNTER — Inpatient Hospital Stay: Payer: Medicare HMO

## 2020-01-22 ENCOUNTER — Inpatient Hospital Stay: Payer: Medicare HMO | Admitting: Internal Medicine

## 2020-01-22 ENCOUNTER — Other Ambulatory Visit: Payer: Self-pay

## 2020-01-22 DIAGNOSIS — E875 Hyperkalemia: Secondary | ICD-10-CM | POA: Diagnosis not present

## 2020-01-22 DIAGNOSIS — C678 Malignant neoplasm of overlapping sites of bladder: Secondary | ICD-10-CM

## 2020-01-22 DIAGNOSIS — Z7189 Other specified counseling: Secondary | ICD-10-CM

## 2020-01-22 DIAGNOSIS — Z7982 Long term (current) use of aspirin: Secondary | ICD-10-CM | POA: Insufficient documentation

## 2020-01-22 DIAGNOSIS — C772 Secondary and unspecified malignant neoplasm of intra-abdominal lymph nodes: Secondary | ICD-10-CM | POA: Insufficient documentation

## 2020-01-22 DIAGNOSIS — I129 Hypertensive chronic kidney disease with stage 1 through stage 4 chronic kidney disease, or unspecified chronic kidney disease: Secondary | ICD-10-CM | POA: Diagnosis not present

## 2020-01-22 DIAGNOSIS — N184 Chronic kidney disease, stage 4 (severe): Secondary | ICD-10-CM | POA: Diagnosis not present

## 2020-01-22 DIAGNOSIS — E032 Hypothyroidism due to medicaments and other exogenous substances: Secondary | ICD-10-CM | POA: Insufficient documentation

## 2020-01-22 DIAGNOSIS — N400 Enlarged prostate without lower urinary tract symptoms: Secondary | ICD-10-CM | POA: Diagnosis not present

## 2020-01-22 DIAGNOSIS — Z79899 Other long term (current) drug therapy: Secondary | ICD-10-CM | POA: Insufficient documentation

## 2020-01-22 DIAGNOSIS — Z5112 Encounter for antineoplastic immunotherapy: Secondary | ICD-10-CM | POA: Insufficient documentation

## 2020-01-22 DIAGNOSIS — Z87891 Personal history of nicotine dependence: Secondary | ICD-10-CM | POA: Diagnosis not present

## 2020-01-22 DIAGNOSIS — D631 Anemia in chronic kidney disease: Secondary | ICD-10-CM | POA: Diagnosis not present

## 2020-01-22 LAB — COMPREHENSIVE METABOLIC PANEL
ALT: 13 U/L (ref 0–44)
AST: 20 U/L (ref 15–41)
Albumin: 3.9 g/dL (ref 3.5–5.0)
Alkaline Phosphatase: 67 U/L (ref 38–126)
Anion gap: 8 (ref 5–15)
BUN: 48 mg/dL — ABNORMAL HIGH (ref 8–23)
CO2: 23 mmol/L (ref 22–32)
Calcium: 9 mg/dL (ref 8.9–10.3)
Chloride: 108 mmol/L (ref 98–111)
Creatinine, Ser: 2.46 mg/dL — ABNORMAL HIGH (ref 0.61–1.24)
GFR calc Af Amer: 27 mL/min — ABNORMAL LOW (ref >60)
GFR calc non Af Amer: 23 mL/min — ABNORMAL LOW (ref >60)
Glucose, Bld: 138 mg/dL — ABNORMAL HIGH (ref 70–99)
Potassium: 4.8 mmol/L (ref 3.5–5.1)
Sodium: 139 mmol/L (ref 135–145)
Total Bilirubin: 0.7 mg/dL (ref 0.3–1.2)
Total Protein: 6.8 g/dL (ref 6.5–8.1)

## 2020-01-22 LAB — CBC WITH DIFFERENTIAL/PLATELET
Abs Immature Granulocytes: 0.01 10*3/uL (ref 0.00–0.07)
Basophils Absolute: 0.1 10*3/uL (ref 0.0–0.1)
Basophils Relative: 1 %
Eosinophils Absolute: 1.3 10*3/uL — ABNORMAL HIGH (ref 0.0–0.5)
Eosinophils Relative: 18 %
HCT: 26.2 % — ABNORMAL LOW (ref 39.0–52.0)
Hemoglobin: 9 g/dL — ABNORMAL LOW (ref 13.0–17.0)
Immature Granulocytes: 0 %
Lymphocytes Relative: 30 %
Lymphs Abs: 2.1 10*3/uL (ref 0.7–4.0)
MCH: 33.6 pg (ref 26.0–34.0)
MCHC: 34.4 g/dL (ref 30.0–36.0)
MCV: 97.8 fL (ref 80.0–100.0)
Monocytes Absolute: 0.5 10*3/uL (ref 0.1–1.0)
Monocytes Relative: 7 %
Neutro Abs: 3.2 10*3/uL (ref 1.7–7.7)
Neutrophils Relative %: 44 %
Platelets: 193 10*3/uL (ref 150–400)
RBC: 2.68 MIL/uL — ABNORMAL LOW (ref 4.22–5.81)
RDW: 12.5 % (ref 11.5–15.5)
WBC: 7.1 10*3/uL (ref 4.0–10.5)
nRBC: 0 % (ref 0.0–0.2)

## 2020-01-22 MED ORDER — SODIUM CHLORIDE 0.9 % IV SOLN
Freq: Once | INTRAVENOUS | Status: AC
  Filled 2020-01-22: qty 250

## 2020-01-22 MED ORDER — SODIUM CHLORIDE 0.9 % IV SOLN
240.0000 mg | Freq: Once | INTRAVENOUS | Status: AC
  Administered 2020-01-22: 240 mg via INTRAVENOUS
  Filled 2020-01-22: qty 24

## 2020-01-22 NOTE — Assessment & Plan Note (Addendum)
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 chronic thickening of bladder stable.  Currently on opdivo. STABLE.   #Proceed with Opdivo today. Labs today reviewed;  acceptable for treatment today.   # Iatrogenic hypothyroidism-on Synthroid 88 mcg;STABLE. TSH June 2021- WNL.   # Anemia sec to CKD/ on Iv iron.Hb-9.0;iron sats- 39% [June 2021]; STABLE  # weight loss- STABLE;  see above; continue  protein shakes/boost.  # CKD stage IV-GFR 23=STABLE; Mild hyperkalemia- improved- 4.3-monitor for now.  # PROSTATISM:STABLE; likely BPH- on flomax; awaiting appt with Dr.Stoioff  # DISPOSITION:  # treatment today;  # Follow-up in 2 weeks- -MD labs-cbc/cmp;;opdivo IV; -Dr.B

## 2020-01-22 NOTE — Progress Notes (Signed)
[place] NOTE  Patient Care Team: [name], [name], [name], [name]; [name]; [name]; mediastinal uptake; right pubic rami uptake.  # [name]; Jan 18th 2021- switched to [place]- [pt preference; [name]; July 2020 cystoscopy-no evidence of bladder malignancy/Dr. Bernardo Heater- enlarged prostate [PSA- 0.95; 2021]  # Molecular testing- PDL-1 CPS- 20%; [name]  ;GOALS: palliative  CURRENT/MOST RECENT THERAPY:OPDIVO [C]     Cancer of overlapping sites of bladder ([place])  02/28/2018 - 06/29/2019 Chemotherapy   The patient had atezolizumab (TECENTRIQ) 1,[name], 1,200 mg, Intravenous, Once, [name],200 mg (02/28/2018), 1,200 mg (03/21/2018), 1,200 mg (04/11/2018), 1,200 mg (05/02/2018), 1,200 mg (06/13/2018), 1,200 mg (05/23/2018), 1,200 mg (07/04/2018), 1,200 mg (07/25/2018), 1,200 mg (08/15/2018), 1,200 mg (09/05/2018), 1,200 mg (10/25/2018), 1,200 mg (11/22/2018), 1,200 mg (12/20/2018), 1,200 mg (01/10/2019), 1,200 mg (01/31/2019), 1,200 mg (02/21/2019), 1,200 mg (03/14/2019), 1,200 mg (04/04/2019), 1,200 mg (04/25/2019), 1,200 mg (05/16/2019), 1,200 mg (06/09/2019)  for chemotherapy treatment.    06/30/2019 -  Chemotherapy   The patient had nivolumab  (OPDIVO) 240 mg in sodium chloride 0.9 % 100 mL chemo infusion, 240 mg, Intravenous, Once, 13 of 16 cycles Administration: 240 mg (06/30/2019), 240 mg (07/14/2019), 240 mg (07/28/2019), 240 mg (08/11/2019), 240 mg (08/25/2019), 240 mg (09/08/2019), 240 mg (09/22/2019), 240 mg (11/03/2019), 240 mg (11/17/2019), 240 mg (12/10/2019), 240 mg (12/24/2019), 240 mg (01/08/2020)  for chemotherapy treatment.     HISTORY OF PRESENTING ILLNESS: Edward Cummings 86 y.o.  male with metastatic transitional carcinoma of the bladder currently on Opdivo is here for follow-up.   Patient denies any difficulty swallowing or pain with swallowing.  Not losing any more weight.  Mild to moderate fatigue.  Continues to have increased frequency of urination awaiting evaluation with urology.  Review of Systems  Constitutional: Positive for malaise/fatigue. Negative for chills, [name], hemoptysis, sputum production and wheezing.   Cardiovascular: Negative for chest pain, palpitations, orthopnea and leg swelling.  Gastrointestinal: Negative for abdominal pain, blood in stool, diarrhea, heartburn, melena, nausea and vomiting.  Genitourinary: Positive for frequency and urgency. Negative for dysuria.  Musculoskeletal: Positive for back pain. Negative for joint pain.  Skin: Negative.  Negative for itching and rash.  Neurological: Negative for tingling, focal weakness, weakness and headaches.  Endo/Heme/Allergies: Does not bruise/bleed easily.  Psychiatric/Behavioral: Negative for depression. The patient is not nervous/anxious and does not have insomnia.      MEDICAL HISTORY:  Past Medical History:  Diagnosis Date   Anemia    Anxiety 12/31/2019   Cancer [facility])    bladder   Chronic kidney disease    Depression    Hypertension    Neuromuscular disorder ([place])  Nerve damage to left face/eye since around 2002.    SURGICAL HISTORY: Past Surgical History:  Procedure Laterality Date   CYSTOSCOPY W/ RETROGRADES Bilateral 01/25/2018   Procedure: CYSTOSCOPY WITH RETROGRADE PYELOGRAM;  [name], MD;  [name];  Service: Urology;  Laterality: Bilateral;   CYSTOSCOPY W/ URETERAL STENT PLACEMENT Bilateral 01/06/2019   Procedure: CYSTOSCOPY WITH RETROGRADE PYELOGRAM/URETERAL STENT REMOVAL;  [name], MD;  [name];  Service: Urology;  Laterality: Bilateral;   CYSTOSCOPY WITH STENT PLACEMENT Bilateral 01/25/2018   Procedure: CYSTOSCOPY WITH STENT PLACEMENT;  [name], MD;  [name];  Service: Urology;  Laterality: Bilateral;   DORSAL SLIT N/A 01/06/2019   Procedure: DORSAL SLIT;  [name], MD;  [name];  Service: Urology;  [name];   ESOPHAGOGASTRODUODENOSCOPY (EGD) WITH PROPOFOL N/A 11/12/2019   Procedure: ESOPHAGOGASTRODUODENOSCOPY (EGD) WITH PROPOFOL;  Surgeon: Virgel Manifold, MD;  Location: [facility] ENDOSCOPY;  Service: Endoscopy;  [name];   EYE SURGERY     Cornea transplants bilaterally & cataract surgery.   TRANSURETHRAL RESECTION OF BLADDER TUMOR N/A 01/25/2018   Procedure: TRANSURETHRAL RESECTION OF BLADDER TUMOR (TURBT);  [name], MD;  [name];  Service: Urology;  [name];    SOCIAL HISTORY: lives in [place]; with wife; quit smoking 18 years ago; beer every 2 months or so.mechanic/retd.   Social History   Socioeconomic History   Marital status: Married    Spouse name: [name]   Number of children: Not on file   Years of education: Not on file   Highest education level: Not on file  Occupational History   Not on file  Tobacco Use   Smoking status: Former Smoker    Types: Cigarettes   Smokeless tobacco: Current User    Types: Chew   Tobacco comment: Stopped  approximately 10 years ago.  Vaping Use   Vaping Use: Never used  Substance and Sexual Activity   Alcohol use: Not Currently    Alcohol/week: 2.0 standard drinks    Types: 2 Cans of beer per week    Comment: Daily   Drug use: Never   Sexual activity: Yes    Birth control/protection: None  Other Topics Concern   Not on file  Social History Narrative   Not on file   Social Determinants of Health   Financial Resource Strain:    Difficulty of Paying Living Expenses:   Food Insecurity:    Worried About Charity fundraiser in the Last Year:    Arboriculturist in the Last Year:   Transportation Needs:    Film/video editor (Medical):    Lack of Transportation (Non-Medical):   Physical Activity:    Days of Exercise per Week:    Minutes of Exercise per Session:   Stress:    Feeling of Stress :   Social Connections:    Frequency of Communication with Friends and Family:    Frequency of Social Gatherings with Friends and Family:    Attends Religious Services:    Active Member of Clubs or Organizations:    Attends Music therapist:    Marital Status:   Intimate Partner Violence:    Fear of Current or Ex-Partner:    Emotionally Abused:    Physically Abused:    Sexually Abused:     FAMILY HISTORY: Family History  Problem Relation Age of Onset   Prostate cancer Neg Hx  Kidney cancer Neg Hx    Bladder Cancer Neg Hx     ALLERGIES:  has No Known Allergies.  MEDICATIONS:  Current Outpatient Medications  Medication Sig Dispense Refill   amLODipine (NORVASC) 5 MG tablet TAKE 1 TABLET BY MOUTH ONCE A DAY 90 tablet 3   aspirin EC 81 MG tablet Take 81 mg by mouth daily.      docusate sodium (COLACE) 50 MG capsule Take 50 mg by mouth at bedtime.     fluticasone (FLONASE) 50 MCG/ACT nasal spray Place 2 sprays into both nostrils daily as needed for allergies or rhinitis. 11.1 mL 6   levothyroxine (SYNTHROID) 88 MCG tablet Take 1  tablet (88 mcg total) by mouth daily before breakfast. Empty stomach- 1 hour prior to breakfast. 90 tablet 3   loratadine (CLARITIN) 10 MG tablet Take 1 tablet (10 mg total) by mouth daily. 30 tablet 11   Multiple Vitamin (MULTIVITAMIN WITH MINERALS) TABS tablet Take 1 tablet by mouth daily.      oxybutynin (DITROPAN) 5 MG tablet Take 1 tablet by mouth 3 (three) times daily as needed.     prednisoLONE acetate (PRED FORTE) 1 % ophthalmic suspension Place 1 drop into both eyes daily.     tamsulosin (FLOMAX) 0.4 MG CAPS capsule Take 1 capsule (0.4 mg total) by mouth at bedtime. 90 capsule 1   fexofenadine (ALLEGRA) 180 MG tablet Take 180 mg by mouth daily. (Patient not taking: Reported on 01/07/2020)     HYDROcodone-acetaminophen (NORCO/VICODIN) 5-325 MG tablet Take 1 tablet by mouth every 8 (eight) hours as needed for moderate pain. (Patient not taking: Reported on 01/07/2020) 30 tablet 0   No current facility-administered medications for this visit.   Facility-Administered Medications Ordered in Other Visits  Medication Dose Route Frequency Provider Last Rate Last Admin   nivolumab (OPDIVO) 240 mg in sodium chloride 0.9 % 100 mL chemo infusion  240 mg Intravenous Once Charlaine Dalton R, MD          .  PHYSICAL EXAMINATION: ECOG PERFORMANCE STATUS: 1 - Symptomatic but completely ambulatory  Vitals:   01/22/20 0833  BP: (!) 140/57  Pulse: (!) 56  Resp: 16  Temp: (!) 96.2 F (35.7 C)  SpO2: 100%   Filed Weights   01/22/20 0833  Weight: 122 lb (55.3 kg)    Physical Exam Constitutional:      Comments: Walking himself.  Thin built moderately nourished male patient.  HENT:     Head: Normocephalic and atraumatic.     Mouth/Throat:     Pharynx: No oropharyngeal exudate.  Eyes:     Pupils: Pupils are equal, round, and reactive to light.     Comments: Chronic drooping of the left eyelid.  Cardiovascular:     Rate and Rhythm: Normal rate and regular rhythm.  Pulmonary:      Effort: No respiratory distress.     Breath sounds: No wheezing.  Abdominal:     General: Bowel sounds are normal. There is no distension.     Palpations: Abdomen is soft. There is no mass.     Tenderness: There is no abdominal tenderness. There is no guarding or rebound.  Musculoskeletal:        General: No tenderness. Normal range of motion.     Cervical back: Normal range of motion and neck supple.  Skin:    General: Skin is warm.  Neurological:     Mental Status: He is alert and oriented to person, place, and time.  Psychiatric:        Mood and Affect: Affect normal.      LABORATORY DATA:  I have reviewed the data as listed Lab Results  Component Value Date   WBC 7.1 01/22/2020   HGB 9.0 (L) 01/22/2020   HCT 26.2 (L) 01/22/2020   MCV 97.8 01/22/2020   PLT 193 01/22/2020   Recent Labs    12/24/19 0831 01/08/20 0823 01/22/20 0823  NA 136 135 139  K 4.8 5.3* 4.8  CL 106 107 108  CO2 '22 22 23  ' GLUCOSE 140* 120* 138*  BUN 35* 40* 48*  CREATININE 2.14* 2.21* 2.46*  CALCIUM 8.7* 9.1 9.0  GFRNONAA 27* 26* 23*  GFRAA 32* 30* 27*  PROT 6.9 7.0 6.8  ALBUMIN 3.8 4.2 3.9  AST '20 19 20  ' ALT '13 13 13  ' ALKPHOS 64 65 67  BILITOT 0.7 0.7 0.7    RADIOGRAPHIC STUDIES: I have personally reviewed the radiological images as listed and agreed with the findings in the report. No results found.  ASSESSMENT & PLAN:   Cancer of overlapping sites of bladder ([place]) # High-grade transitional cell carcinoma of the bladder metastatic to retroperitoneal lymph node.  Stage IV;JUNE 29th 2021-  CT-chest and pelvis-noncontrast- negative for recurrence; prostatomegaly /thickening at the GE junction  [see below]. chronic thickening of bladder stable.  Currently on opdivo. STABLE.   #Proceed with Opdivo today. Labs today reviewed;  acceptable for treatment today.   # Iatrogenic hypothyroidism-on Synthroid 88 mcg;STABLE. TSH June 2021- WNL.   # Anemia sec to CKD/ on Iv iron.Hb-9.0;iron  sats- 39% [June 2021]; STABLE  # weight loss- STABLE;  see above; continue  protein shakes/boost.  # CKD stage IV-GFR 26=STABLE; Mild hyperkalemia- K-5.3; monitor for now...   # PROSTATISM:STABLE; likely BPH- on flomax; awaiting appt with Dr.Stoioff  # DISPOSITION:  # treatment today;  # Follow-up in 2 weeks- -MD labs-cbc/cmp;;opdivo IV; -Dr.B      All questions were answered. The patient knows to call the clinic with any problems, questions or concerns.    Cammie Sickle, MD 01/22/2020 9:22 AM

## 2020-01-29 ENCOUNTER — Telehealth: Payer: Self-pay | Admitting: *Deleted

## 2020-01-29 ENCOUNTER — Ambulatory Visit (INDEPENDENT_AMBULATORY_CARE_PROVIDER_SITE_OTHER): Payer: Medicare HMO | Admitting: Internal Medicine

## 2020-01-29 ENCOUNTER — Encounter: Payer: Self-pay | Admitting: Internal Medicine

## 2020-01-29 ENCOUNTER — Other Ambulatory Visit: Payer: Self-pay

## 2020-01-29 VITALS — BP 117/66 | HR 66 | Ht 65.0 in | Wt 119.0 lb

## 2020-01-29 DIAGNOSIS — C678 Malignant neoplasm of overlapping sites of bladder: Secondary | ICD-10-CM

## 2020-01-29 DIAGNOSIS — N1831 Chronic kidney disease, stage 3a: Secondary | ICD-10-CM | POA: Diagnosis not present

## 2020-01-29 DIAGNOSIS — D631 Anemia in chronic kidney disease: Secondary | ICD-10-CM | POA: Diagnosis not present

## 2020-01-29 DIAGNOSIS — F419 Anxiety disorder, unspecified: Secondary | ICD-10-CM | POA: Diagnosis not present

## 2020-01-29 DIAGNOSIS — R69 Illness, unspecified: Secondary | ICD-10-CM | POA: Diagnosis not present

## 2020-01-29 DIAGNOSIS — H2513 Age-related nuclear cataract, bilateral: Secondary | ICD-10-CM | POA: Diagnosis not present

## 2020-01-29 NOTE — Assessment & Plan Note (Signed)
-   Patient experiencing high levels of anxiety.  - Encouraged patient to engage in relaxing activities like yoga, meditation, journaling, going for a walk, or participating in a hobby.  - Encouraged patient to reach out to trusted friends or family members about recent struggles 

## 2020-01-29 NOTE — Assessment & Plan Note (Addendum)
Pt is in chemotherapy at the cancer center. He also will see the urologist. Because of the immunocompromised status, I suggest that he get the COVID booster shot.

## 2020-01-29 NOTE — Assessment & Plan Note (Signed)
Stable at the present time. Refer to optometrist.

## 2020-01-29 NOTE — Progress Notes (Signed)
Established Patient Office Visit  SUBJECTIVE:  Subjective  Patient ID: Edward Cummings, male    DOB: 04-10-1933  Age: 87 y.o. MRN: [id number]  CC:  Chief Complaint  Patient presents with  . Follow-up    no complaints     HPI Edward Cummings is a 86 y.o. male presenting today for a general follow up  He was seen by Dr. Rogue Bussing for his bladder cancer on 01/08/2020 and 01/22/2020. He complained of increased nocturia. He received his treatment of Opdivo at both visits.   He has an appointment with Dr. Bernardo Heater in Urology on 02/11/2020.   He has gotten two doses against the COVID. He would likely be eligible for the booster shot at this point.   He notes that he has some anxiety. He has home health care that comes to his home to check up on him and help him out with basic needs.   Past Medical History:  Diagnosis Date  . Anemia   . Anxiety 12/31/2019  . Cancer ([place])    bladder  . Chronic kidney disease   . Depression   . Hypertension   . Neuromuscular disorder ([place])    Nerve damage to left face/eye since around 2002.    Past Surgical History:  Procedure Laterality Date  . CYSTOSCOPY W/ RETROGRADES Bilateral 01/25/2018   Procedure: CYSTOSCOPY WITH RETROGRADE PYELOGRAM;  [name], MD;  [name];  Service: Urology;  Laterality: Bilateral;  . CYSTOSCOPY W/ URETERAL STENT PLACEMENT Bilateral 01/06/2019   Procedure: CYSTOSCOPY WITH RETROGRADE PYELOGRAM/URETERAL STENT REMOVAL;  [name], MD;  [name];  Service: Urology;  Laterality: Bilateral;  . CYSTOSCOPY WITH STENT PLACEMENT Bilateral 01/25/2018   Procedure: CYSTOSCOPY WITH STENT PLACEMENT;  [name], MD;  [name];  Service: Urology;  Laterality: Bilateral;  . DORSAL SLIT N/A 01/06/2019   Procedure: DORSAL SLIT;  [name], MD;  [name];  Service: Urology;  [name];  . ESOPHAGOGASTRODUODENOSCOPY (EGD) WITH PROPOFOL  N/A 11/12/2019   Procedure: ESOPHAGOGASTRODUODENOSCOPY (EGD) WITH PROPOFOL;  Surgeon: Virgel Manifold, MD;  Location: [facility];  Service: Endoscopy;  [name];  . EYE SURGERY     Cornea transplants bilaterally & cataract surgery.  . TRANSURETHRAL RESECTION OF BLADDER TUMOR N/A 01/25/2018   Procedure: TRANSURETHRAL RESECTION OF BLADDER TUMOR (TURBT);  [name], MD;  [name];  Service: Urology;  [name];    Family History  Problem Relation Age of Onset  . Prostate cancer Neg Hx   . Kidney cancer Neg Hx   . Bladder Cancer Neg Hx     Social History   Socioeconomic History  . Marital status: Married    Spouse name: [name]  . Number of children: Not on file  . Years of education: Not on file  . Highest education level: Not on file  Occupational History  . Not on file  Tobacco Use  . Smoking status: Former Smoker    Types: Cigarettes  . Smokeless tobacco: Current User    Types: Chew  . Tobacco comment: Stopped approximately 10 years ago.  Vaping Use  . Vaping Use: Never used  Substance and Sexual Activity  . Alcohol use: Not Currently    Alcohol/week: 2.0 standard drinks    Types: 2 Cans of beer per week    Comment: Daily  . Drug use: Never  . Sexual activity: Yes    Birth control/protection:  None  Other Topics Concern  . Not on file  Social History Narrative  . Not on file   Social Determinants of Health   Financial Resource Strain:   . Difficulty of Paying Living Expenses: Not on file  Food Insecurity:   . Worried About Charity fundraiser in the Last Year: Not on file  . Ran Out of Food in the Last Year: Not on file  Transportation Needs:   . Lack of Transportation (Medical): Not on file  . Lack of Transportation (Non-Medical): Not on file  Physical Activity:   . Days of Exercise per Week: Not on file  . Minutes of Exercise per Session: Not on file  Stress:   . Feeling of Stress : Not on file  Social  Connections:   . Frequency of Communication with Friends and Family: Not on file  . Frequency of Social Gatherings with Friends and Family: Not on file  . Attends Religious Services: Not on file  . Active Member of Clubs or Organizations: Not on file  . Attends Archivist Meetings: Not on file  . Marital Status: Not on file  Intimate Partner Violence:   . Fear of Current or Ex-Partner: Not on file  . Emotionally Abused: Not on file  . Physically Abused: Not on file  . Sexually Abused: Not on file     Current Outpatient Medications:  .  amLODipine (NORVASC) 5 MG tablet, TAKE 1 TABLET BY MOUTH ONCE A DAY, Disp: 90 tablet, Rfl: 3 .  aspirin EC 81 MG tablet, Take 81 mg by mouth daily. , Disp: , Rfl:  .  docusate sodium (COLACE) 50 MG capsule, Take 50 mg by mouth at bedtime., Disp: , Rfl:  .  fexofenadine (ALLEGRA) 180 MG tablet, Take 180 mg by mouth daily. , Disp: , Rfl:  .  fluticasone (FLONASE) 50 MCG/ACT nasal spray, Place 2 sprays into both nostrils daily as needed for allergies or rhinitis., Disp: 11.1 mL, Rfl: 6 .  HYDROcodone-acetaminophen (NORCO/VICODIN) 5-325 MG tablet, Take 1 tablet by mouth every 8 (eight) hours as needed for moderate pain., Disp: 30 tablet, Rfl: 0 .  levothyroxine (SYNTHROID) 88 MCG tablet, Take 1 tablet (88 mcg total) by mouth daily before breakfast. Empty stomach- 1 hour prior to breakfast., Disp: 90 tablet, Rfl: 3 .  loratadine (CLARITIN) 10 MG tablet, Take 1 tablet (10 mg total) by mouth daily., Disp: 30 tablet, Rfl: 11 .  Multiple Vitamin (MULTIVITAMIN WITH MINERALS) TABS tablet, Take 1 tablet by mouth daily. , Disp: , Rfl:  .  oxybutynin (DITROPAN) 5 MG tablet, Take 1 tablet by mouth 3 (three) times daily as needed., Disp: , Rfl:  .  prednisoLONE acetate (PRED FORTE) 1 % ophthalmic suspension, Place 1 drop into both eyes daily., Disp: , Rfl:  .  tamsulosin (FLOMAX) 0.4 MG CAPS capsule, Take 1 capsule (0.4 mg total) by mouth at bedtime., Disp: 90  capsule, Rfl: 1   No Known Allergies  ROS Review of Systems  Constitutional: Positive for fatigue.  HENT: Negative.   Eyes: Negative.   Respiratory: Negative.  Negative for chest tightness and shortness of breath.   Cardiovascular: Negative.  Negative for chest pain.  Gastrointestinal: Negative.   Endocrine: Negative.   Genitourinary: Negative.   Musculoskeletal: Negative.  Negative for back pain.  Skin: Negative.   Allergic/Immunologic: Negative.   Neurological: Negative.   Hematological: Negative.   Psychiatric/Behavioral: Negative.   All other systems reviewed and are negative.  OBJECTIVE:    Physical Exam Vitals reviewed.  Constitutional:      Appearance: Normal appearance.  HENT:     Mouth/Throat:     Mouth: Mucous membranes are moist.  Eyes:     Pupils: Pupils are equal, round, and reactive to light.  Neck:     Vascular: No carotid bruit.  Cardiovascular:     Rate and Rhythm: Normal rate and regular rhythm.     Pulses: Normal pulses.     Heart sounds: Normal heart sounds.  Pulmonary:     Effort: Pulmonary effort is normal.     Breath sounds: Normal breath sounds.  Abdominal:     General: Bowel sounds are normal.     Palpations: Abdomen is soft. There is no hepatomegaly, splenomegaly or mass.     Tenderness: There is no abdominal tenderness.     Hernia: No hernia is present.  Musculoskeletal:     Cervical back: Neck supple.     Right lower leg: No edema.     Left lower leg: No edema.  Skin:    Findings: No rash.  Neurological:     Mental Status: He is alert and oriented to person, place, and time.     Motor: No weakness.  Psychiatric:        Mood and Affect: Mood normal.        Behavior: Behavior normal.     BP 117/66   Pulse 66   Ht 5\' 5"  (1.651 m)   Wt 119 lb (54 kg)   BMI 19.80 kg/m  Wt Readings from Last 3 Encounters:  01/29/20 119 lb (54 kg)  01/22/20 122 lb (55.3 kg)  01/08/20 122 lb 3.2 oz (55.4 kg)    Health Maintenance Due    Topic Date Due  . COVID-19 Vaccine (1) Never done  . TETANUS/TDAP  Never done  . PNA vac Low Risk Adult (2 of 2 - PCV13) 04/09/2019  . INFLUENZA VACCINE  01/11/2020    There are no preventive care reminders to display for this patient.  CBC Latest Ref Rng & Units 01/22/2020 01/08/2020 12/24/2019  WBC 4.0 - 10.5 K/uL 7.1 7.9 6.9  Hemoglobin 13.0 - 17.0 g/dL 9.0(L) 8.9(L) 8.9(L)  Hematocrit 39 - 52 % 26.2(L) 26.7(L) 26.3(L)  Platelets 150 - 400 K/uL 193 191 201   CMP Latest Ref Rng & Units 01/22/2020 01/08/2020 12/24/2019  Glucose 70 - 99 mg/dL 138(H) 120(H) 140(H)  BUN 8 - 23 mg/dL 48(H) 40(H) 35(H)  Creatinine 0.61 - 1.24 mg/dL 2.46(H) 2.21(H) 2.14(H)  Sodium 135 - 145 mmol/L 139 135 136  Potassium 3.5 - 5.1 mmol/L 4.8 5.3(H) 4.8  Chloride 98 - 111 mmol/L 108 107 106  CO2 22 - 32 mmol/L 23 22 22   Calcium 8.9 - 10.3 mg/dL 9.0 9.1 8.7(L)  Total Protein 6.5 - 8.1 g/dL 6.8 7.0 6.9  Total Bilirubin 0.3 - 1.2 mg/dL 0.7 0.7 0.7  Alkaline Phos 38 - 126 U/L 67 65 64  AST 15 - 41 U/L 20 19 20   ALT 0 - 44 U/L 13 13 13     Lab Results  Component Value Date   TSH 0.200 (L) 09/22/2019   Lab Results  Component Value Date   ALBUMIN 3.9 01/22/2020   ANIONGAP 8 01/22/2020   No results found for: CHOL, HDL, LDLCALC, CHOLHDL No results found for: TRIG Lab Results  Component Value Date   HGBA1C 4.5 (L) 04/13/2018      ASSESSMENT & PLAN:  Problem List Items Addressed This Visit      Genitourinary   Cancer of overlapping sites of bladder ([place])    Pt is in chemotherapy at the cancer center. He also will see the urologist. Because of the immunocompromised status, I suggest that he get the COVID booster shot.       Anemia due to stage 3 chronic kidney disease    Pt complained of mild fatigue. He is anemic due to CKD. He is unable to gain any weight, so he takes ensure to supplement his diet.         Other   Anxiety - Primary    - Patient experiencing high levels of anxiety.  -  Encouraged patient to engage in relaxing activities like yoga, meditation, journaling, going for a walk, or participating in a hobby.  - Encouraged patient to reach out to trusted friends or family members about recent struggles       RESOLVED: Nuclear sclerosis of both eyes    Stable at the present time. Refer to optometrist.          No orders of the defined types were placed in this encounter.   Follow-up: No follow-ups on file.    Dr. Jane Canary [facility] [address], [place], [place] [address]   By signing my name below, I, [employer], attest that this documentation has been prepared under the direction and in the presence of [name], MD. Electronically Signed: [name], MD 01/29/20, 9:54 AM   I personally performed the services described in this documentation, which was SCRIBED in my presence. The recorded information has been reviewed and considered accurate. It has been edited as necessary during review. [name], MD

## 2020-01-29 NOTE — Telephone Encounter (Signed)
-----   Message from Cammie Sickle, MD sent at 01/29/2020 12:16 PM EDT ----- Regarding: RE: TSH H- please order ----- Message ----- From: Adelina Mings, [facility] Sent: 01/29/2020  12:15 PM EDT To: Gloris Ham, RN, # Subject: TSH                                            Last TSH level in June with cycle 10, consider ordering another level with cycle 14 on 02/05/20. Thanks.

## 2020-01-29 NOTE — Assessment & Plan Note (Signed)
Pt complained of mild fatigue. He is anemic due to CKD. He is unable to gain any weight, so he takes ensure to supplement his diet.

## 2020-02-04 DIAGNOSIS — R69 Illness, unspecified: Secondary | ICD-10-CM | POA: Diagnosis not present

## 2020-02-05 ENCOUNTER — Inpatient Hospital Stay: Payer: Medicare HMO

## 2020-02-05 ENCOUNTER — Encounter: Payer: Self-pay | Admitting: Internal Medicine

## 2020-02-05 ENCOUNTER — Other Ambulatory Visit: Payer: Self-pay

## 2020-02-05 ENCOUNTER — Inpatient Hospital Stay (HOSPITAL_BASED_OUTPATIENT_CLINIC_OR_DEPARTMENT_OTHER): Payer: Medicare HMO | Admitting: Internal Medicine

## 2020-02-05 DIAGNOSIS — C678 Malignant neoplasm of overlapping sites of bladder: Secondary | ICD-10-CM | POA: Diagnosis not present

## 2020-02-05 DIAGNOSIS — Z5112 Encounter for antineoplastic immunotherapy: Secondary | ICD-10-CM | POA: Diagnosis not present

## 2020-02-05 DIAGNOSIS — Z7189 Other specified counseling: Secondary | ICD-10-CM

## 2020-02-05 LAB — CBC WITH DIFFERENTIAL/PLATELET
Abs Immature Granulocytes: 0.02 10*3/uL (ref 0.00–0.07)
Basophils Absolute: 0.1 10*3/uL (ref 0.0–0.1)
Basophils Relative: 1 %
Eosinophils Absolute: 1 10*3/uL — ABNORMAL HIGH (ref 0.0–0.5)
Eosinophils Relative: 14 %
HCT: 25.7 % — ABNORMAL LOW (ref 39.0–52.0)
Hemoglobin: 8.9 g/dL — ABNORMAL LOW (ref 13.0–17.0)
Immature Granulocytes: 0 %
Lymphocytes Relative: 32 %
Lymphs Abs: 2.2 10*3/uL (ref 0.7–4.0)
MCH: 33.5 pg (ref 26.0–34.0)
MCHC: 34.6 g/dL (ref 30.0–36.0)
MCV: 96.6 fL (ref 80.0–100.0)
Monocytes Absolute: 0.5 10*3/uL (ref 0.1–1.0)
Monocytes Relative: 7 %
Neutro Abs: 3.3 10*3/uL (ref 1.7–7.7)
Neutrophils Relative %: 46 %
Platelets: 214 10*3/uL (ref 150–400)
RBC: 2.66 MIL/uL — ABNORMAL LOW (ref 4.22–5.81)
RDW: 12.8 % (ref 11.5–15.5)
WBC: 7.1 10*3/uL (ref 4.0–10.5)
nRBC: 0 % (ref 0.0–0.2)

## 2020-02-05 LAB — COMPREHENSIVE METABOLIC PANEL
ALT: 13 U/L (ref 0–44)
AST: 20 U/L (ref 15–41)
Albumin: 4 g/dL (ref 3.5–5.0)
Alkaline Phosphatase: 70 U/L (ref 38–126)
Anion gap: 7 (ref 5–15)
BUN: 49 mg/dL — ABNORMAL HIGH (ref 8–23)
CO2: 21 mmol/L — ABNORMAL LOW (ref 22–32)
Calcium: 9 mg/dL (ref 8.9–10.3)
Chloride: 107 mmol/L (ref 98–111)
Creatinine, Ser: 2.3 mg/dL — ABNORMAL HIGH (ref 0.61–1.24)
GFR calc Af Amer: 29 mL/min — ABNORMAL LOW (ref >60)
GFR calc non Af Amer: 25 mL/min — ABNORMAL LOW (ref >60)
Glucose, Bld: 139 mg/dL — ABNORMAL HIGH (ref 70–99)
Potassium: 4.3 mmol/L (ref 3.5–5.1)
Sodium: 135 mmol/L (ref 135–145)
Total Bilirubin: 0.6 mg/dL (ref 0.3–1.2)
Total Protein: 7.1 g/dL (ref 6.5–8.1)

## 2020-02-05 LAB — TSH: TSH: 0.489 u[IU]/mL (ref 0.350–4.500)

## 2020-02-05 MED ORDER — SODIUM CHLORIDE 0.9 % IV SOLN
Freq: Once | INTRAVENOUS | Status: AC
  Filled 2020-02-05: qty 250

## 2020-02-05 MED ORDER — SODIUM CHLORIDE 0.9 % IV SOLN
240.0000 mg | Freq: Once | INTRAVENOUS | Status: AC
  Administered 2020-02-05: 240 mg via INTRAVENOUS
  Filled 2020-02-05: qty 24

## 2020-02-05 NOTE — Assessment & Plan Note (Addendum)
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 chronic thickening of bladder stable.  Currently on opdivo. STABLE.   #Proceed with Opdivo today. Labs today reviewed;  acceptable for treatment today.   # Iatrogenic hypothyroidism-on Synthroid 88 mcg;STABLE. TSH June 2021- WNL. TSH pending today.   # Anemia sec to CKD/ on Iv iron.Hb-8-9; iron sats- 39% [June 2021]; STABLE  # weight loss- STABLE;  see above; continue  protein shakes/boost.  # CKD stage IV-GFR 23=STABLE; Mild hyperkalemia- improved- 4.3-monitor for now.  # PROSTATISM/intermittent hematuria- on flomax; awaiting appt with Dr.Stoioff on sept1st.   # COVID BOOSTER: Discussed given patient's diagnosis and other comorbidities/therapies-patient would be considered immunocompromised.  As per CDC recommendation/FDA approval-I would recommend booster vaccine.  Patient is  Interested.    # DISPOSITION:  # treatment today;  # Follow-up in 2 weeks- -MD labs-cbc/cmp;;opdivo IV; -Dr.B

## 2020-02-05 NOTE — Progress Notes (Signed)
[place] NOTE  Patient Care Team: [name], [name], [name], [name]; [name]; [name]; mediastinal uptake; right pubic rami uptake.  # [name]; Jan 18th 2021- switched to [place]- [pt preference; [name]; July 2020 cystoscopy-no evidence of bladder malignancy/Dr. Bernardo Heater- enlarged prostate [PSA- 0.95; 2021]  # Molecular testing- PDL-1 CPS- 20%; [name]  ;GOALS: palliative  CURRENT/MOST RECENT THERAPY:OPDIVO [C]     Cancer of overlapping sites of bladder ([place])  02/28/2018 - 06/29/2019 Chemotherapy   The patient had atezolizumab (TECENTRIQ) 1,[name], 1,200 mg, Intravenous, Once, [name],200 mg (02/28/2018), 1,200 mg (03/21/2018), 1,200 mg (04/11/2018), 1,200 mg (05/02/2018), 1,200 mg (06/13/2018), 1,200 mg (05/23/2018), 1,200 mg (07/04/2018), 1,200 mg (07/25/2018), 1,200 mg (08/15/2018), 1,200 mg (09/05/2018), 1,200 mg (10/25/2018), 1,200 mg (11/22/2018), 1,200 mg (12/20/2018), 1,200 mg (01/10/2019), 1,200 mg (01/31/2019), 1,200 mg (02/21/2019), 1,200 mg (03/14/2019), 1,200 mg (04/04/2019), 1,200 mg (04/25/2019), 1,200 mg (05/16/2019), 1,200 mg (06/09/2019)  for chemotherapy treatment.    06/30/2019 -  Chemotherapy   The patient had nivolumab  (OPDIVO) 240 mg in sodium chloride 0.9 % 100 mL chemo infusion, 240 mg, Intravenous, Once, 13 of 16 cycles Administration: 240 mg (06/30/2019), 240 mg (07/14/2019), 240 mg (07/28/2019), 240 mg (08/11/2019), 240 mg (08/25/2019), 240 mg (09/08/2019), 240 mg (09/22/2019), 240 mg (11/03/2019), 240 mg (11/17/2019), 240 mg (12/10/2019), 240 mg (12/24/2019), 240 mg (01/08/2020), 240 mg (01/22/2020)  for chemotherapy treatment.     HISTORY OF PRESENTING ILLNESS: Edward Cummings 86 y.o.  male with metastatic transitional carcinoma of the bladder currently on Opdivo is here for follow-up.  Notes of intermittent episodes of blood in urine.  None this morning.  Awaiting urology evaluation next week.  Continues to have increased frequency of urination; currently on Flomax.  Swallowing is improved.  No more weight loss.  Mild to moderate fatigue.  Review of Systems  Constitutional: Positive for malaise/fatigue. Negative for chills, [name], hemoptysis, sputum production and wheezing.   Cardiovascular: Negative for chest pain, palpitations, orthopnea and leg swelling.  Gastrointestinal: Negative for abdominal pain, blood in stool, diarrhea, heartburn, melena, nausea and vomiting.  Genitourinary: Positive for frequency and urgency. Negative for dysuria.  Musculoskeletal: Positive for back pain. Negative for joint pain.  Skin: Negative.  Negative for itching and rash.  Neurological: Negative for tingling, focal weakness, weakness and headaches.  Endo/Heme/Allergies: Does not bruise/bleed easily.  Psychiatric/Behavioral: Negative for depression. The patient is not nervous/anxious and does not have insomnia.      MEDICAL HISTORY:  Past Medical History:  Diagnosis Date  . Anemia   . Anxiety 12/31/2019  . Cancer ([place])    bladder  . Chronic kidney disease    .  Depression   . Hypertension   . Neuromuscular disorder ([place])    Nerve damage to left face/eye since around 2002.    SURGICAL HISTORY: Past Surgical History:  Procedure Laterality Date  . CYSTOSCOPY W/ RETROGRADES Bilateral 01/25/2018   Procedure: CYSTOSCOPY WITH RETROGRADE PYELOGRAM;  [name], MD;  [name];  Service: Urology;  Laterality: Bilateral;  . CYSTOSCOPY W/ URETERAL STENT PLACEMENT Bilateral 01/06/2019   Procedure: CYSTOSCOPY WITH RETROGRADE PYELOGRAM/URETERAL STENT REMOVAL;  [name], MD;  [name];  Service: Urology;  Laterality: Bilateral;  . CYSTOSCOPY WITH STENT PLACEMENT Bilateral 01/25/2018   Procedure: CYSTOSCOPY WITH STENT PLACEMENT;  [name], MD;  [name];  Service: Urology;  Laterality: Bilateral;  . DORSAL SLIT N/A 01/06/2019   Procedure: DORSAL SLIT;  [name], MD;  [name];  Service: Urology;  [name];  . ESOPHAGOGASTRODUODENOSCOPY (EGD) WITH PROPOFOL N/A 11/12/2019   Procedure: ESOPHAGOGASTRODUODENOSCOPY (EGD) WITH PROPOFOL;  Surgeon: Virgel Manifold, MD;  Location: [facility];  Service: Endoscopy;  [name];  . EYE SURGERY     Cornea transplants bilaterally & cataract surgery.  . TRANSURETHRAL RESECTION OF BLADDER TUMOR N/A 01/25/2018   Procedure: TRANSURETHRAL RESECTION OF BLADDER TUMOR (TURBT);  [name], MD;  [name];  Service: Urology;  [name];    SOCIAL HISTORY: lives in [place]; with wife; quit smoking 18 years ago; beer every 2 months or so.mechanic/retd.   Social History   Socioeconomic History  . Marital status: Married    Spouse name: [name]  . Number of children: Not on file  . Years of education: Not on file  . Highest education level: Not on file  Occupational History  . Not on file  Tobacco Use  . Smoking status: Former Smoker    Types: Cigarettes  . Smokeless  tobacco: Current User    Types: Chew  . Tobacco comment: Stopped approximately 10 years ago.  Vaping Use  . Vaping Use: Never used  Substance and Sexual Activity  . Alcohol use: Not Currently    Alcohol/week: 2.0 standard drinks    Types: 2 Cans of beer per week    Comment: Daily  . Drug use: Never  . Sexual activity: Yes    Birth control/protection: None  Other Topics Concern  . Not on file  Social History Narrative  . Not on file   Social Determinants of Health   Financial Resource Strain:   . Difficulty of Paying Living Expenses: Not on file  Food Insecurity:   . Worried About Charity fundraiser in the Last Year: Not on file  . Ran Out of Food in the Last Year: Not on file  Transportation Needs:   . Lack of Transportation (Medical): Not on file  . Lack of Transportation (Non-Medical): Not on file  Physical Activity:   . Days of Exercise per Week: Not on file  . Minutes of Exercise per Session: Not on file  Stress:   . Feeling of Stress : Not on file  Social Connections:   . Frequency of Communication with Friends and Family: Not on file  . Frequency of Social Gatherings with Friends and Family: Not on file  . Attends Religious Services: Not on file  . Active Member of Clubs or Organizations: Not on file  . Attends Archivist Meetings: Not on file  . Marital Status: Not on file  Intimate Partner Violence:   .  Fear of Current or Ex-Partner: Not on file  . Emotionally Abused: Not on file  . Physically Abused: Not on file  . Sexually Abused: Not on file    FAMILY HISTORY: Family History  Problem Relation Age of Onset  . Prostate cancer Neg Hx   . Kidney cancer Neg Hx   . Bladder Cancer Neg Hx     ALLERGIES:  has No Known Allergies.  MEDICATIONS:  Current Outpatient Medications  Medication Sig Dispense Refill  . amLODipine (NORVASC) 5 MG tablet TAKE 1 TABLET BY MOUTH ONCE A DAY 90 tablet 3  . aspirin EC 81 MG tablet Take 81 mg by mouth daily.      Marland Kitchen docusate sodium (COLACE) 50 MG capsule Take 50 mg by mouth at bedtime.    . fexofenadine (ALLEGRA) 180 MG tablet Take 180 mg by mouth daily.     . fluticasone (FLONASE) 50 MCG/ACT nasal spray Place 2 sprays into both nostrils daily as needed for allergies or rhinitis. 11.1 mL 6  . HYDROcodone-acetaminophen (NORCO/VICODIN) 5-325 MG tablet Take 1 tablet by mouth every 8 (eight) hours as needed for moderate pain. 30 tablet 0  . levothyroxine (SYNTHROID) 88 MCG tablet Take 1 tablet (88 mcg total) by mouth daily before breakfast. Empty stomach- 1 hour prior to breakfast. 90 tablet 3  . loratadine (CLARITIN) 10 MG tablet Take 1 tablet (10 mg total) by mouth daily. 30 tablet 11  . Multiple Vitamin (MULTIVITAMIN WITH MINERALS) TABS tablet Take 1 tablet by mouth daily.     Marland Kitchen oxybutynin (DITROPAN) 5 MG tablet Take 1 tablet by mouth 3 (three) times daily as needed.    . prednisoLONE acetate (PRED FORTE) 1 % ophthalmic suspension Place 1 drop into both eyes daily.    . tamsulosin (FLOMAX) 0.4 MG CAPS capsule Take 1 capsule (0.4 mg total) by mouth at bedtime. 90 capsule 1   No current facility-administered medications for this visit.      Marland Kitchen  PHYSICAL EXAMINATION: ECOG PERFORMANCE STATUS: 1 - Symptomatic but completely ambulatory  Vitals:   02/05/20 0847  BP: (!) 137/56  Pulse: 60  Resp: 16  Temp: (!) 95.7 F (35.4 C)  SpO2: 98%   Filed Weights   02/05/20 0847  Weight: 122 lb 12.8 oz (55.7 kg)    Physical Exam Constitutional:      Comments: Walking himself.  Thin built moderately nourished male patient.  HENT:     Head: Normocephalic and atraumatic.     Mouth/Throat:     Pharynx: No oropharyngeal exudate.  Eyes:     Pupils: Pupils are equal, round, and reactive to light.     Comments: Chronic drooping of the left eyelid.  Cardiovascular:     Rate and Rhythm: Normal rate and regular rhythm.  Pulmonary:     Effort: No respiratory distress.     Breath sounds: No wheezing.   Abdominal:     General: Bowel sounds are normal. There is no distension.     Palpations: Abdomen is soft. There is no mass.     Tenderness: There is no abdominal tenderness. There is no guarding or rebound.  Musculoskeletal:        General: No tenderness. Normal range of motion.     Cervical back: Normal range of motion and neck supple.  Skin:    General: Skin is warm.  Neurological:     Mental Status: He is alert and oriented to person, place, and time.  Psychiatric:  Mood and Affect: Affect normal.      LABORATORY DATA:  I have reviewed the data as listed Lab Results  Component Value Date   WBC 7.1 02/05/2020   HGB 8.9 (L) 02/05/2020   HCT 25.7 (L) 02/05/2020   MCV 96.6 02/05/2020   PLT 214 02/05/2020   Recent Labs    01/08/20 0823 01/22/20 0823 02/05/20 0835  NA 135 139 135  K 5.3* 4.8 4.3  CL 107 108 107  CO2 22 23 21*  GLUCOSE 120* 138* 139*  BUN 40* 48* 49*  CREATININE 2.21* 2.46* 2.30*  CALCIUM 9.1 9.0 9.0  GFRNONAA 26* 23* 25*  GFRAA 30* 27* 29*  PROT 7.0 6.8 7.1  ALBUMIN 4.2 3.9 4.0  AST _0 ALT _1 ALKPHOS 65 67 70  BILITOT 0.7 0.7 0.6    RADIOGRAPHIC STUDIES: I have personally reviewed the radiological images as listed and agreed with the findings in the report. No results found.  ASSESSMENT & PLAN:   Cancer of overlapping sites of bladder ([place]) # High-grade transitional cell carcinoma of the bladder metastatic to retroperitoneal lymph node.  Stage IV;JUNE 29th 2021-  CT-chest and pelvis-noncontrast- negative for recurrence; prostatomegaly /thickening at the GE junction  [see below]. chronic thickening of bladder stable.  Currently on opdivo. STABLE.   #Proceed with Opdivo today. Labs today reviewed;  acceptable for treatment today.   # Iatrogenic hypothyroidism-on Synthroid 88 mcg;STABLE. TSH June 2021- WNL. TSH pending today.   # Anemia sec to CKD/ on Iv iron.Hb-8-9; iron sats- 39% [June 2021]; STABLE  # weight loss-  STABLE;  see above; continue  protein shakes/boost.  # CKD stage IV-GFR 23=STABLE; Mild hyperkalemia- improved- 4.3-monitor for now.  # PROSTATISM/intermittent hematuria- on flomax; awaiting appt with Dr.Stoioff on sept1st.   # COVID BOOSTER: Discussed given patient's diagnosis and other comorbidities/therapies-patient would be considered immunocompromised.  As per CDC recommendation/FDA approval-I would recommend booster vaccine.  Patient is  Interested.    # DISPOSITION:  # treatment today;  # Follow-up in 2 weeks- -MD labs-cbc/cmp;;opdivo IV; -Dr.B      All questions were answered. The patient knows to call the clinic with any problems, questions or concerns.    Cammie Sickle, MD 02/05/2020 9:28 AM

## 2020-02-06 ENCOUNTER — Telehealth: Payer: Self-pay | Admitting: Internal Medicine

## 2020-02-06 NOTE — Telephone Encounter (Signed)
Updated the son of patient's clinical progress; recommend follow-up appointment with urology as planned next week.

## 2020-02-09 ENCOUNTER — Other Ambulatory Visit: Payer: Medicare HMO | Admitting: Urology

## 2020-02-11 ENCOUNTER — Other Ambulatory Visit: Payer: Self-pay

## 2020-02-11 ENCOUNTER — Ambulatory Visit: Payer: Medicare HMO | Admitting: Urology

## 2020-02-11 ENCOUNTER — Encounter: Payer: Self-pay | Admitting: Urology

## 2020-02-11 VITALS — BP 145/55 | HR 83 | Ht 68.0 in | Wt 122.0 lb

## 2020-02-11 DIAGNOSIS — C679 Malignant neoplasm of bladder, unspecified: Secondary | ICD-10-CM

## 2020-02-16 NOTE — Progress Notes (Signed)
   02/16/20  CC:  Chief Complaint  Patient presents with  . Cysto   Indications: Metastatic bladder cancer followed in oncology  HPI: Edward Cummings has done well since last visit.  Denies gross hematuria or flank/abdominal/pelvic pain  Blood pressure (!) 145/55, pulse 83, height 5\' 8"  (1.727 m), weight 122 lb (55.3 kg). NED. A&Ox3.   No respiratory distress   Abd soft, NT, ND Normal phallus with bilateral descended testicles  Cystoscopy Procedure Note  Patient identification was confirmed, informed consent was obtained, and patient was prepped using Betadine solution.  Lidocaine jelly was administered per urethral meatus.     Pre-Procedure: - Inspection reveals a normal caliber ureteral meatus.  Procedure: The flexible cystoscope was introduced without difficulty - No urethral strictures/lesions are present. - Moderate lateral lobe enlargement prostate  - Moderate elevation bladder neck - Bilateral ureteral orifices identified - Bladder mucosa  reveals no ulcers, tumors, or lesions - No bladder stones - No trabeculation  Retroflexion shows no abnormalities   Post-Procedure: - Patient tolerated the procedure well  Assessment/ Plan:  No evidence mucosal abnormalities or recurrent tumor in bladder  Continue oncology follow-ups  Follow-up surveillance cystoscopy 6 months   Abbie Sons, MD

## 2020-02-19 ENCOUNTER — Inpatient Hospital Stay: Payer: Medicare HMO

## 2020-02-19 ENCOUNTER — Inpatient Hospital Stay: Payer: Medicare HMO | Admitting: Internal Medicine

## 2020-02-19 ENCOUNTER — Inpatient Hospital Stay: Payer: Medicare HMO | Attending: Internal Medicine

## 2020-02-19 ENCOUNTER — Other Ambulatory Visit: Payer: Self-pay

## 2020-02-19 ENCOUNTER — Encounter: Payer: Self-pay | Admitting: Internal Medicine

## 2020-02-19 DIAGNOSIS — C678 Malignant neoplasm of overlapping sites of bladder: Secondary | ICD-10-CM

## 2020-02-19 DIAGNOSIS — Z5112 Encounter for antineoplastic immunotherapy: Secondary | ICD-10-CM | POA: Insufficient documentation

## 2020-02-19 DIAGNOSIS — Z87891 Personal history of nicotine dependence: Secondary | ICD-10-CM | POA: Insufficient documentation

## 2020-02-19 DIAGNOSIS — D631 Anemia in chronic kidney disease: Secondary | ICD-10-CM | POA: Diagnosis not present

## 2020-02-19 DIAGNOSIS — Z7189 Other specified counseling: Secondary | ICD-10-CM

## 2020-02-19 DIAGNOSIS — N184 Chronic kidney disease, stage 4 (severe): Secondary | ICD-10-CM | POA: Insufficient documentation

## 2020-02-19 DIAGNOSIS — Z7982 Long term (current) use of aspirin: Secondary | ICD-10-CM | POA: Insufficient documentation

## 2020-02-19 DIAGNOSIS — Z79899 Other long term (current) drug therapy: Secondary | ICD-10-CM | POA: Insufficient documentation

## 2020-02-19 DIAGNOSIS — I10 Essential (primary) hypertension: Secondary | ICD-10-CM | POA: Diagnosis not present

## 2020-02-19 LAB — CBC WITH DIFFERENTIAL/PLATELET
Abs Immature Granulocytes: 0.02 10*3/uL (ref 0.00–0.07)
Basophils Absolute: 0.1 10*3/uL (ref 0.0–0.1)
Basophils Relative: 1 %
Eosinophils Absolute: 1.1 10*3/uL — ABNORMAL HIGH (ref 0.0–0.5)
Eosinophils Relative: 15 %
HCT: 25.7 % — ABNORMAL LOW (ref 39.0–52.0)
Hemoglobin: 8.7 g/dL — ABNORMAL LOW (ref 13.0–17.0)
Immature Granulocytes: 0 %
Lymphocytes Relative: 31 %
Lymphs Abs: 2.3 10*3/uL (ref 0.7–4.0)
MCH: 33.2 pg (ref 26.0–34.0)
MCHC: 33.9 g/dL (ref 30.0–36.0)
MCV: 98.1 fL (ref 80.0–100.0)
Monocytes Absolute: 0.5 10*3/uL (ref 0.1–1.0)
Monocytes Relative: 7 %
Neutro Abs: 3.3 10*3/uL (ref 1.7–7.7)
Neutrophils Relative %: 46 %
Platelets: 209 10*3/uL (ref 150–400)
RBC: 2.62 MIL/uL — ABNORMAL LOW (ref 4.22–5.81)
RDW: 12.6 % (ref 11.5–15.5)
WBC: 7.3 10*3/uL (ref 4.0–10.5)
nRBC: 0 % (ref 0.0–0.2)

## 2020-02-19 LAB — COMPREHENSIVE METABOLIC PANEL
ALT: 12 U/L (ref 0–44)
AST: 17 U/L (ref 15–41)
Albumin: 3.9 g/dL (ref 3.5–5.0)
Alkaline Phosphatase: 70 U/L (ref 38–126)
Anion gap: 8 (ref 5–15)
BUN: 34 mg/dL — ABNORMAL HIGH (ref 8–23)
CO2: 21 mmol/L — ABNORMAL LOW (ref 22–32)
Calcium: 8.9 mg/dL (ref 8.9–10.3)
Chloride: 106 mmol/L (ref 98–111)
Creatinine, Ser: 2.24 mg/dL — ABNORMAL HIGH (ref 0.61–1.24)
GFR calc Af Amer: 30 mL/min — ABNORMAL LOW (ref >60)
GFR calc non Af Amer: 26 mL/min — ABNORMAL LOW (ref >60)
Glucose, Bld: 101 mg/dL — ABNORMAL HIGH (ref 70–99)
Potassium: 4.7 mmol/L (ref 3.5–5.1)
Sodium: 135 mmol/L (ref 135–145)
Total Bilirubin: 0.8 mg/dL (ref 0.3–1.2)
Total Protein: 7 g/dL (ref 6.5–8.1)

## 2020-02-19 MED ORDER — HEPARIN SOD (PORK) LOCK FLUSH 100 UNIT/ML IV SOLN
INTRAVENOUS | Status: AC
  Filled 2020-02-19: qty 5

## 2020-02-19 MED ORDER — SODIUM CHLORIDE 0.9 % IV SOLN
240.0000 mg | Freq: Once | INTRAVENOUS | Status: AC
  Administered 2020-02-19: 240 mg via INTRAVENOUS
  Filled 2020-02-19: qty 24

## 2020-02-19 MED ORDER — SODIUM CHLORIDE 0.9 % IV SOLN
Freq: Once | INTRAVENOUS | Status: AC
  Filled 2020-02-19: qty 250

## 2020-02-19 NOTE — Assessment & Plan Note (Addendum)
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 chronic thickening of bladder stable.  Currently on opdivo. STABLE.   #Proceed with Opdivo today. Labs today reviewed;  acceptable for treatment today.   # Iatrogenic hypothyroidism-on Synthroid 88 mcg;STABLE. TSH AUG 2021- WNL.  # Anemia sec to CKD/ on Iv iron.Hb-8-7; iron sats- 39% [June 2021]; STABLE s/p cysto [02/11/20]; monitor for now.   # weight loss- STABLE; continue  protein shakes/boost.  # CKD stage IV-GFR 26-STABLE.  I left a voicemail with the patient's son, Ronalee Belts- regarding the patient's clinical status/plan of care.     # DISPOSITION:  # treatment today;  # Follow-up in 2 weeks- -MD labs-cbc/cmp;opdivo IV; -Dr.B

## 2020-02-19 NOTE — Progress Notes (Signed)
[place] NOTE  Patient Care Team: [name], [name], [name], [name]; [name]; [name]; mediastinal uptake; right pubic rami uptake.  # [name]; Jan 18th 2021- switched to [facility]- [pt preference; [name]; July 2020 cystoscopy-no evidence of bladder malignancy/Dr. Bernardo Heater- enlarged prostate [PSA- 0.95; 2021]  # Molecular testing- PDL-1 CPS- 20%; [name]  ;GOALS: palliative  CURRENT/MOST RECENT THERAPY:OPDIVO [C]     Cancer of overlapping sites of bladder ([place])  02/28/2018 - 06/29/2019 Chemotherapy   The patient had atezolizumab (TECENTRIQ) 1,[name], 1,200 mg, Intravenous, Once, [name],200 mg (02/28/2018), 1,200 mg (03/21/2018), 1,200 mg (04/11/2018), 1,200 mg (05/02/2018), 1,200 mg (06/13/2018), 1,200 mg (05/23/2018), 1,200 mg (07/04/2018), 1,200 mg (07/25/2018), 1,200 mg (08/15/2018), 1,200 mg (09/05/2018), 1,200 mg (10/25/2018), 1,200 mg (11/22/2018), 1,200 mg (12/20/2018), 1,200 mg (01/10/2019), 1,200 mg (01/31/2019), 1,200 mg (02/21/2019), 1,200 mg (03/14/2019), 1,200 mg (04/04/2019), 1,200 mg (04/25/2019), 1,200 mg (05/16/2019), 1,200 mg (06/09/2019)  for chemotherapy treatment.    06/30/2019 -  Chemotherapy   The patient had nivolumab  (OPDIVO) 240 mg in sodium chloride 0.9 % 100 mL chemo infusion, 240 mg, Intravenous, Once, 14 of 16 cycles Administration: 240 mg (06/30/2019), 240 mg (07/14/2019), 240 mg (07/28/2019), 240 mg (08/11/2019), 240 mg (08/25/2019), 240 mg (09/08/2019), 240 mg (09/22/2019), 240 mg (11/03/2019), 240 mg (11/17/2019), 240 mg (12/10/2019), 240 mg (12/24/2019), 240 mg (01/08/2020), 240 mg (01/22/2020), 240 mg (02/05/2020)  for chemotherapy treatment.     HISTORY OF PRESENTING ILLNESS: Edward Cummings 86 y.o.  male with metastatic transitional carcinoma of the bladder currently on Opdivo is here for follow-up.  In the interim patient was evaluated with urology; had a cystoscopy.  As per patient cystoscopy was normal.  Denies any obvious blood in urine.  Denies any weight loss.  Appetite is fair.  Mild to moderate fatigue.  Review of Systems  Constitutional: Positive for malaise/fatigue. Negative for chills, [name], hemoptysis, sputum production and wheezing.   Cardiovascular: Negative for chest pain, palpitations, orthopnea and leg swelling.  Gastrointestinal: Negative for abdominal pain, blood in stool, diarrhea, heartburn, melena, nausea and vomiting.  Genitourinary: Positive for frequency and urgency. Negative for dysuria.  Musculoskeletal: Positive for back pain. Negative for joint pain.  Skin: Negative.  Negative for itching and rash.  Neurological: Negative for tingling, focal weakness, weakness and headaches.  Endo/Heme/Allergies: Does not bruise/bleed easily.  Psychiatric/Behavioral: Negative for depression. The patient is not nervous/anxious and does not have insomnia.      MEDICAL HISTORY:  Past Medical History:  Diagnosis Date  . Anemia   . Anxiety 12/31/2019  . Cancer ([place])    bladder  . Chronic kidney disease   . Depression   .  Hypertension   . Neuromuscular disorder ([place])    Nerve damage to left face/eye since around 2002.    SURGICAL HISTORY: Past Surgical History:  Procedure Laterality Date  . CYSTOSCOPY W/ RETROGRADES Bilateral 01/25/2018   Procedure: CYSTOSCOPY WITH RETROGRADE PYELOGRAM;  [name], MD;  [name];  Service: Urology;  Laterality: Bilateral;  . CYSTOSCOPY W/ URETERAL STENT PLACEMENT Bilateral 01/06/2019   Procedure: CYSTOSCOPY WITH RETROGRADE PYELOGRAM/URETERAL STENT REMOVAL;  [name], MD;  [name];  Service: Urology;  Laterality: Bilateral;  . CYSTOSCOPY WITH STENT PLACEMENT Bilateral 01/25/2018   Procedure: CYSTOSCOPY WITH STENT PLACEMENT;  [name], MD;  [name];  Service: Urology;  Laterality: Bilateral;  . DORSAL SLIT N/A 01/06/2019   Procedure: DORSAL SLIT;  [name], MD;  [name];  Service: Urology;  [name];  . ESOPHAGOGASTRODUODENOSCOPY (EGD) WITH PROPOFOL N/A 11/12/2019   Procedure: ESOPHAGOGASTRODUODENOSCOPY (EGD) WITH PROPOFOL;  Surgeon: Virgel Manifold, MD;  Location: [facility];  Service: Endoscopy;  [name];  . EYE SURGERY     Cornea transplants bilaterally & cataract surgery.  . TRANSURETHRAL RESECTION OF BLADDER TUMOR N/A 01/25/2018   Procedure: TRANSURETHRAL RESECTION OF BLADDER TUMOR (TURBT);  [name], MD;  [name];  Service: Urology;  [name];    SOCIAL HISTORY: lives in [place]; with wife; quit smoking 18 years ago; beer every 2 months or so.mechanic/retd.   Social History   Socioeconomic History  . Marital status: Married    Spouse name: [name]  . Number of children: Not on file  . Years of education: Not on file  . Highest education level: Not on file  Occupational History  . Not on file  Tobacco Use  . Smoking status: Former Smoker    Types: Cigarettes  . Smokeless tobacco: Current User     Types: Chew  . Tobacco comment: Stopped approximately 10 years ago.  Vaping Use  . Vaping Use: Never used  Substance and Sexual Activity  . Alcohol use: Not Currently    Alcohol/week: 2.0 standard drinks    Types: 2 Cans of beer per week    Comment: Daily  . Drug use: Never  . Sexual activity: Yes    Birth control/protection: None  Other Topics Concern  . Not on file  Social History Narrative  . Not on file   Social Determinants of Health   Financial Resource Strain:   . Difficulty of Paying Living Expenses: Not on file  Food Insecurity:   . Worried About Charity fundraiser in the Last Year: Not on file  . Ran Out of Food in the Last Year: Not on file  Transportation Needs:   . Lack of Transportation (Medical): Not on file  . Lack of Transportation (Non-Medical): Not on file  Physical Activity:   . Days of Exercise per Week: Not on file  . Minutes of Exercise per Session: Not on file  Stress:   . Feeling of Stress : Not on file  Social Connections:   . Frequency of Communication with Friends and Family: Not on file  . Frequency of Social Gatherings with Friends and Family: Not on file  . Attends Religious Services: Not on file  . Active Member of Clubs or Organizations: Not on file  . Attends Archivist Meetings: Not on file  . Marital Status: Not on file  Intimate Partner Violence:   . Fear of  Current or Ex-Partner: Not on file  . Emotionally Abused: Not on file  . Physically Abused: Not on file  . Sexually Abused: Not on file    FAMILY HISTORY: Family History  Problem Relation Age of Onset  . Prostate cancer Neg Hx   . Kidney cancer Neg Hx   . Bladder Cancer Neg Hx     ALLERGIES:  has No Known Allergies.  MEDICATIONS:  Current Outpatient Medications  Medication Sig Dispense Refill  . amLODipine (NORVASC) 5 MG tablet TAKE 1 TABLET BY MOUTH ONCE A DAY 90 tablet 3  . aspirin EC 81 MG tablet Take 81 mg by mouth daily.     Marland Kitchen docusate sodium  (COLACE) 50 MG capsule Take 50 mg by mouth at bedtime.    . fexofenadine (ALLEGRA) 180 MG tablet Take 180 mg by mouth daily.     . fluticasone (FLONASE) 50 MCG/ACT nasal spray Place 2 sprays into both nostrils daily as needed for allergies or rhinitis. 11.1 mL 6  . levothyroxine (SYNTHROID) 88 MCG tablet Take 1 tablet (88 mcg total) by mouth daily before breakfast. Empty stomach- 1 hour prior to breakfast. 90 tablet 3  . loratadine (CLARITIN) 10 MG tablet Take 1 tablet (10 mg total) by mouth daily. 30 tablet 11  . Multiple Vitamin (MULTIVITAMIN WITH MINERALS) TABS tablet Take 1 tablet by mouth daily.     Marland Kitchen oxybutynin (DITROPAN) 5 MG tablet Take 1 tablet by mouth 3 (three) times daily as needed.    . prednisoLONE acetate (PRED FORTE) 1 % ophthalmic suspension Place 1 drop into both eyes daily.    . tamsulosin (FLOMAX) 0.4 MG CAPS capsule Take 1 capsule (0.4 mg total) by mouth at bedtime. 90 capsule 1  . HYDROcodone-acetaminophen (NORCO/VICODIN) 5-325 MG tablet Take 1 tablet by mouth every 8 (eight) hours as needed for moderate pain. (Patient not taking: Reported on 02/19/2020) 30 tablet 0   No current facility-administered medications for this visit.      Marland Kitchen  PHYSICAL EXAMINATION: ECOG PERFORMANCE STATUS: 1 - Symptomatic but completely ambulatory  Vitals:   02/19/20 0827  BP: (!) 135/51  Pulse: (!) 52  Resp: 16  Temp: (!) 96.3 F (35.7 C)  SpO2: 100%   Filed Weights   02/19/20 0827  Weight: 122 lb (55.3 kg)    Physical Exam Constitutional:      Comments: Walking himself.  Thin built moderately nourished male patient.  HENT:     Head: Normocephalic and atraumatic.     Mouth/Throat:     Pharynx: No oropharyngeal exudate.  Eyes:     Pupils: Pupils are equal, round, and reactive to light.     Comments: Chronic drooping of the left eyelid.  Cardiovascular:     Rate and Rhythm: Normal rate and regular rhythm.  Pulmonary:     Effort: No respiratory distress.     Breath sounds: No  wheezing.  Abdominal:     General: Bowel sounds are normal. There is no distension.     Palpations: Abdomen is soft. There is no mass.     Tenderness: There is no abdominal tenderness. There is no guarding or rebound.  Musculoskeletal:        General: No tenderness. Normal range of motion.     Cervical back: Normal range of motion and neck supple.  Skin:    General: Skin is warm.  Neurological:     Mental Status: He is alert and oriented to person, place, and time.  Psychiatric:  Mood and Affect: Affect normal.      LABORATORY DATA:  I have reviewed the data as listed Lab Results  Component Value Date   WBC 7.3 02/19/2020   HGB 8.7 (L) 02/19/2020   HCT 25.7 (L) 02/19/2020   MCV 98.1 02/19/2020   PLT 209 02/19/2020   Recent Labs    01/22/20 0823 02/05/20 0835 02/19/20 0813  NA 139 135 135  K 4.8 4.3 4.7  CL 108 107 106  CO2 23 21* 21*  GLUCOSE 138* 139* 101*  BUN 48* 49* 34*  CREATININE 2.46* 2.30* 2.24*  CALCIUM 9.0 9.0 8.9  GFRNONAA 23* 25* 26*  GFRAA 27* 29* 30*  PROT 6.8 7.1 7.0  ALBUMIN 3.9 4.0 3.9  AST '20 20 17  ' ALT '13 13 12  ' ALKPHOS 67 70 70  BILITOT 0.7 0.6 0.8    RADIOGRAPHIC STUDIES: I have personally reviewed the radiological images as listed and agreed with the findings in the report. No results found.  ASSESSMENT & PLAN:   Cancer of overlapping sites of bladder ([place]) # High-grade transitional cell carcinoma of the bladder metastatic to retroperitoneal lymph node.  Stage IV;JUNE 29th 2021-  CT-chest and pelvis-noncontrast- negative for recurrence; prostatomegaly /thickening at the GE junction  [see below]. chronic thickening of bladder stable.  Currently on opdivo. STABLE.   #Proceed with Opdivo today. Labs today reviewed;  acceptable for treatment today.   # Iatrogenic hypothyroidism-on Synthroid 88 mcg;STABLE. TSH AUG 2021- WNL.  # Anemia sec to CKD/ on Iv iron.Hb-8-7; iron sats- 39% [June 2021]; STABLE s/p cysto [02/11/20]; monitor  for now.   # weight loss- STABLE; continue  protein shakes/boost.  # CKD stage IV-GFR 26-STABLE.  I left a voicemail with the patient's son, Ronalee Belts- regarding the patient's clinical status/plan of care.     # DISPOSITION:  # treatment today;  # Follow-up in 2 weeks- -MD labs-cbc/cmp;opdivo IV; -Dr.B      All questions were answered. The patient knows to call the clinic with any problems, questions or concerns.    Cammie Sickle, MD 02/19/2020 9:00 AM

## 2020-02-25 ENCOUNTER — Encounter: Payer: Self-pay | Admitting: Urology

## 2020-02-25 DIAGNOSIS — C679 Malignant neoplasm of bladder, unspecified: Secondary | ICD-10-CM | POA: Insufficient documentation

## 2020-02-26 ENCOUNTER — Ambulatory Visit: Payer: Medicare HMO | Admitting: Internal Medicine

## 2020-02-27 ENCOUNTER — Encounter: Payer: Self-pay | Admitting: Internal Medicine

## 2020-02-27 ENCOUNTER — Other Ambulatory Visit: Payer: Self-pay

## 2020-02-27 ENCOUNTER — Ambulatory Visit (INDEPENDENT_AMBULATORY_CARE_PROVIDER_SITE_OTHER): Payer: Medicare HMO | Admitting: Internal Medicine

## 2020-02-27 VITALS — BP 112/51 | HR 60 | Ht 68.0 in | Wt 120.5 lb

## 2020-02-27 DIAGNOSIS — R69 Illness, unspecified: Secondary | ICD-10-CM | POA: Diagnosis not present

## 2020-02-27 DIAGNOSIS — D631 Anemia in chronic kidney disease: Secondary | ICD-10-CM | POA: Diagnosis not present

## 2020-02-27 DIAGNOSIS — I10 Essential (primary) hypertension: Secondary | ICD-10-CM

## 2020-02-27 DIAGNOSIS — N1831 Chronic kidney disease, stage 3a: Secondary | ICD-10-CM | POA: Diagnosis not present

## 2020-02-27 DIAGNOSIS — J301 Allergic rhinitis due to pollen: Secondary | ICD-10-CM

## 2020-02-27 DIAGNOSIS — F419 Anxiety disorder, unspecified: Secondary | ICD-10-CM

## 2020-02-27 MED ORDER — FLUTICASONE PROPIONATE 50 MCG/ACT NA SUSP: 2.0000 | Freq: Every day | NASAL | 6 refills | Status: DC | PRN

## 2020-02-27 MED ORDER — LORATADINE 10 MG PO TABS: 10.0000 mg | ORAL_TABLET | Freq: Every day | ORAL | 11 refills | Status: DC

## 2020-02-27 NOTE — Assessment & Plan Note (Signed)
Will use  claritin 5 mg po daily

## 2020-02-27 NOTE — Assessment & Plan Note (Signed)
-   Patient experiencing high levels of anxiety.  - Encouraged patient to engage in relaxing activities like yoga, meditation, journaling, going for a walk, or participating in a hobby.  - Encouraged patient to reach out to trusted friends or family members about recent struggles 

## 2020-02-27 NOTE — Progress Notes (Signed)
Established Patient Office Visit  Subjective:  Patient ID: Edward Cummings, male    DOB: Dec 08, 1932  Age: 86 y.o. MRN: [id number]  CC:  Chief Complaint  Patient presents with  . Anxiety  . Allergies    patient complains of season allergies, having drainage down throat causing itchy cough    Sinusitis Patient presents with chronic sinusitis. The patient reports chronic sinus infections for 1 month.  His symptoms include sneezing.  There has not been a history of sinusitis in past. There has not been a history of chronic otitis media or pharyngotonsillitis.   Other medications have included Nasonex.  He has not had allergy testing     Anxiety Presents for initial visit. Onset was 6 to 12 months ago. The problem has been gradually improving. Symptoms include excessive worry. Patient reports no chest pain, decreased concentration, depressed mood, feeling of choking, restlessness or shortness of breath.      Edward Cummings presents for sinusproblem  Past Medical History:  Diagnosis Date  . Anemia   . Anxiety 12/31/2019  . Cancer ([place])    bladder  . Chronic kidney disease   . Depression   . Hypertension   . Neuromuscular disorder ([place])    Nerve damage to left face/eye since around 2002.    Past Surgical History:  Procedure Laterality Date  . CYSTOSCOPY W/ RETROGRADES Bilateral 01/25/2018   Procedure: CYSTOSCOPY WITH RETROGRADE PYELOGRAM;  [name], MD;  [name];  Service: Urology;  Laterality: Bilateral;  . CYSTOSCOPY W/ URETERAL STENT PLACEMENT Bilateral 01/06/2019   Procedure: CYSTOSCOPY WITH RETROGRADE PYELOGRAM/URETERAL STENT REMOVAL;  [name], MD;  [name];  Service: Urology;  Laterality: Bilateral;  . CYSTOSCOPY WITH STENT PLACEMENT Bilateral 01/25/2018   Procedure: CYSTOSCOPY WITH STENT PLACEMENT;  [name], MD;  [name];  Service: Urology;  Laterality: Bilateral;  . DORSAL SLIT N/A  01/06/2019   Procedure: DORSAL SLIT;  [name], MD;  [name];  Service: Urology;  [name];  . ESOPHAGOGASTRODUODENOSCOPY (EGD) WITH PROPOFOL N/A 11/12/2019   Procedure: ESOPHAGOGASTRODUODENOSCOPY (EGD) WITH PROPOFOL;  Surgeon: Virgel Manifold, MD;  Location: [facility];  Service: Endoscopy;  [name];  . EYE SURGERY     Cornea transplants bilaterally & cataract surgery.  . TRANSURETHRAL RESECTION OF BLADDER TUMOR N/A 01/25/2018   Procedure: TRANSURETHRAL RESECTION OF BLADDER TUMOR (TURBT);  [name], MD;  [name];  Service: Urology;  [name];    Family History  Problem Relation Age of Onset  . Prostate cancer Neg Hx   . Kidney cancer Neg Hx   . Bladder Cancer Neg Hx     Social History   Socioeconomic History  . Marital status: Married    Spouse name: Edward Cummings  . Number of children: Not on file  . Years of education: Not on file  . Highest education level: Not on file  Occupational History  . Not on file  Tobacco Use  . Smoking status: Former Smoker    Types: Cigarettes  . Smokeless tobacco: Current User    Types: Chew  . Tobacco comment: Stopped approximately 10 years ago.  Vaping Use  . Vaping Use: Never used  Substance and Sexual Activity  . Alcohol use: Not Currently    Alcohol/week: 2.0 standard drinks    Types: 2 Cans of beer per week    Comment: Daily  . Drug use: Never  .  Sexual activity: Yes    Birth control/protection: None  Other Topics Concern  . Not on file  Social History Narrative  . Not on file   Social Determinants of Health   Financial Resource Strain:   . Difficulty of Paying Living Expenses: Not on file  Food Insecurity:   . Worried About Charity fundraiser in the Last Year: Not on file  . Ran Out of Food in the Last Year: Not on file  Transportation Needs:   . Lack of Transportation (Medical): Not on file  . Lack of Transportation (Non-Medical): Not on file    Physical Activity:   . Days of Exercise per Week: Not on file  . Minutes of Exercise per Session: Not on file  Stress:   . Feeling of Stress : Not on file  Social Connections:   . Frequency of Communication with Friends and Family: Not on file  . Frequency of Social Gatherings with Friends and Family: Not on file  . Attends Religious Services: Not on file  . Active Member of Clubs or Organizations: Not on file  . Attends Archivist Meetings: Not on file  . Marital Status: Not on file  Intimate Partner Violence:   . Fear of Current or Ex-Partner: Not on file  . Emotionally Abused: Not on file  . Physically Abused: Not on file  . Sexually Abused: Not on file     Current Outpatient Medications:  .  amLODipine (NORVASC) 5 MG tablet, TAKE 1 TABLET BY MOUTH ONCE A DAY, Disp: 90 tablet, Rfl: 3 .  aspirin EC 81 MG tablet, Take 81 mg by mouth daily. , Disp: , Rfl:  .  docusate sodium (COLACE) 50 MG capsule, Take 50 mg by mouth at bedtime., Disp: , Rfl:  .  fexofenadine (ALLEGRA) 180 MG tablet, Take 180 mg by mouth daily. , Disp: , Rfl:  .  fluticasone (FLONASE) 50 MCG/ACT nasal spray, Place 2 sprays into both nostrils daily as needed for allergies or rhinitis., Disp: 11.1 mL, Rfl: 6 .  HYDROcodone-acetaminophen (NORCO/VICODIN) 5-325 MG tablet, Take 1 tablet by mouth every 8 (eight) hours as needed for moderate pain., Disp: 30 tablet, Rfl: 0 .  levothyroxine (SYNTHROID) 88 MCG tablet, Take 1 tablet (88 mcg total) by mouth daily before breakfast. Empty stomach- 1 hour prior to breakfast., Disp: 90 tablet, Rfl: 3 .  loratadine (CLARITIN) 10 MG tablet, Take 1 tablet (10 mg total) by mouth daily., Disp: 30 tablet, Rfl: 11 .  Multiple Vitamin (MULTIVITAMIN WITH MINERALS) TABS tablet, Take 1 tablet by mouth daily. , Disp: , Rfl:  .  oxybutynin (DITROPAN) 5 MG tablet, Take 1 tablet by mouth 3 (three) times daily as needed., Disp: , Rfl:  .  prednisoLONE acetate (PRED FORTE) 1 % ophthalmic  suspension, Place 1 drop into both eyes daily., Disp: , Rfl:  .  tamsulosin (FLOMAX) 0.4 MG CAPS capsule, Take 1 capsule (0.4 mg total) by mouth at bedtime., Disp: 90 capsule, Rfl: 1   No Known Allergies  ROS Review of Systems  Constitutional: Negative for appetite change.  HENT: Negative for hearing loss.   Respiratory: Negative for shortness of breath.   Cardiovascular: Negative for chest pain.  Endocrine: Negative for cold intolerance.  Genitourinary: Negative for enuresis.  Musculoskeletal: Negative for back pain.  Neurological: Negative for facial asymmetry and speech difficulty.  Psychiatric/Behavioral: Negative for decreased concentration.      Objective:    Physical Exam Constitutional:  Appearance: Normal appearance. He is normal weight.  HENT:     Right Ear: Tympanic membrane normal.     Left Ear: Tympanic membrane normal.     Nose: Nose normal.     Mouth/Throat:     Mouth: Mucous membranes are moist.  Eyes:     Extraocular Movements: Extraocular movements intact.  Cardiovascular:     Rate and Rhythm: Regular rhythm.  Pulmonary:     Breath sounds: Normal breath sounds.  Abdominal:     Palpations: There is no mass.  Musculoskeletal:     Cervical back: Neck supple.  Skin:    Coloration: Skin is pale.  Neurological:     Mental Status: He is alert.     BP (!) 112/51   Pulse 60   Ht 5\' 8"  (1.727 m)   Wt 120 lb 8 oz (54.7 kg)   BMI 18.32 kg/m  Wt Readings from Last 3 Encounters:  02/27/20 120 lb 8 oz (54.7 kg)  02/19/20 122 lb (55.3 kg)  02/11/20 122 lb (55.3 kg)     Health Maintenance Due  Topic Date Due  . COVID-19 Vaccine (1) Never done  . TETANUS/TDAP  Never done  . PNA vac Low Risk Adult (2 of 2 - PCV13) 04/09/2019  . INFLUENZA VACCINE  Never done    There are no preventive care reminders to display for this patient.  Lab Results  Component Value Date   TSH 0.489 02/05/2020   Lab Results  Component Value Date   WBC 7.3  02/19/2020   HGB 8.7 (L) 02/19/2020   HCT 25.7 (L) 02/19/2020   MCV 98.1 02/19/2020   PLT 209 02/19/2020   Lab Results  Component Value Date   NA 135 02/19/2020   K 4.7 02/19/2020   CO2 21 (L) 02/19/2020   GLUCOSE 101 (H) 02/19/2020   BUN 34 (H) 02/19/2020   CREATININE 2.24 (H) 02/19/2020   BILITOT 0.8 02/19/2020   ALKPHOS 70 02/19/2020   AST 17 02/19/2020   ALT 12 02/19/2020   PROT 7.0 02/19/2020   ALBUMIN 3.9 02/19/2020   CALCIUM 8.9 02/19/2020   ANIONGAP 8 02/19/2020   No results found for: CHOL No results found for: HDL No results found for: LDLCALC No results found for: TRIG No results found for: CHOLHDL Lab Results  Component Value Date   HGBA1C 4.5 (L) 04/13/2018      Assessment & Plan:   Problem List Items Addressed This Visit      Cardiovascular and Mediastinum   Hypertension    - Today, the patient's blood pressure is well managed on amlodapine. - The patient will continue the current treatment regimen.  - I encouraged the patient to eat a low-sodium diet to help control blood pressure. - I encouraged the patient to live an active lifestyle and complete activities that increases heart rate to 85% target heart rate at least 5 times per week for one hour.            Respiratory   Seasonal allergic rhinitis due to pollen    Will use  claritin 5 mg po daily      Relevant Medications   fluticasone (FLONASE) 50 MCG/ACT nasal spray   loratadine (CLARITIN) 10 MG tablet     Genitourinary   Anemia due to stage 3 chronic kidney disease    stable        Other   Anxiety - Primary    - Patient experiencing high levels of anxiety.  -  Encouraged patient to engage in relaxing activities like yoga, meditation, journaling, going for a walk, or participating in a hobby.  - Encouraged patient to reach out to trusted friends or family members about recent struggles          Meds ordered this encounter  Medications  . fluticasone (FLONASE) 50 MCG/ACT  nasal spray    Sig: Place 2 sprays into both nostrils daily as needed for allergies or rhinitis.    Dispense:  11.1 mL    Refill:  6  . loratadine (CLARITIN) 10 MG tablet    Sig: Take 1 tablet (10 mg total) by mouth daily.    Dispense:  30 tablet    Refill:  11    Follow-up: No follow-ups on file.    [name], MD

## 2020-02-27 NOTE — Assessment & Plan Note (Signed)
stable °

## 2020-02-27 NOTE — Assessment & Plan Note (Signed)
-   Today, the patient's blood pressure is well managed on amlodapine. - The patient will continue the current treatment regimen.  - I encouraged the patient to eat a low-sodium diet to help control blood pressure. - I encouraged the patient to live an active lifestyle and complete activities that increases heart rate to 85% target heart rate at least 5 times per week for one hour.     

## 2020-03-04 ENCOUNTER — Inpatient Hospital Stay (HOSPITAL_BASED_OUTPATIENT_CLINIC_OR_DEPARTMENT_OTHER): Payer: Medicare HMO | Admitting: Internal Medicine

## 2020-03-04 ENCOUNTER — Encounter: Payer: Self-pay | Admitting: Internal Medicine

## 2020-03-04 ENCOUNTER — Inpatient Hospital Stay: Payer: Medicare HMO

## 2020-03-04 ENCOUNTER — Other Ambulatory Visit: Payer: Self-pay

## 2020-03-04 VITALS — BP 127/50 | HR 57 | Resp 14

## 2020-03-04 DIAGNOSIS — C678 Malignant neoplasm of overlapping sites of bladder: Secondary | ICD-10-CM

## 2020-03-04 DIAGNOSIS — Z5112 Encounter for antineoplastic immunotherapy: Secondary | ICD-10-CM | POA: Diagnosis not present

## 2020-03-04 DIAGNOSIS — Z7189 Other specified counseling: Secondary | ICD-10-CM

## 2020-03-04 LAB — COMPREHENSIVE METABOLIC PANEL
ALT: 13 U/L (ref 0–44)
AST: 18 U/L (ref 15–41)
Albumin: 4 g/dL (ref 3.5–5.0)
Alkaline Phosphatase: 65 U/L (ref 38–126)
Anion gap: 7 (ref 5–15)
BUN: 33 mg/dL — ABNORMAL HIGH (ref 8–23)
CO2: 23 mmol/L (ref 22–32)
Calcium: 8.9 mg/dL (ref 8.9–10.3)
Chloride: 107 mmol/L (ref 98–111)
Creatinine, Ser: 2.29 mg/dL — ABNORMAL HIGH (ref 0.61–1.24)
GFR calc Af Amer: 29 mL/min — ABNORMAL LOW (ref >60)
GFR calc non Af Amer: 25 mL/min — ABNORMAL LOW (ref >60)
Glucose, Bld: 98 mg/dL (ref 70–99)
Potassium: 4.7 mmol/L (ref 3.5–5.1)
Sodium: 137 mmol/L (ref 135–145)
Total Bilirubin: 0.6 mg/dL (ref 0.3–1.2)
Total Protein: 7 g/dL (ref 6.5–8.1)

## 2020-03-04 LAB — CBC WITH DIFFERENTIAL/PLATELET
Abs Immature Granulocytes: 0.01 10*3/uL (ref 0.00–0.07)
Basophils Absolute: 0.1 10*3/uL (ref 0.0–0.1)
Basophils Relative: 1 %
Eosinophils Absolute: 1.2 10*3/uL — ABNORMAL HIGH (ref 0.0–0.5)
Eosinophils Relative: 15 %
HCT: 26.1 % — ABNORMAL LOW (ref 39.0–52.0)
Hemoglobin: 8.8 g/dL — ABNORMAL LOW (ref 13.0–17.0)
Immature Granulocytes: 0 %
Lymphocytes Relative: 36 %
Lymphs Abs: 3 10*3/uL (ref 0.7–4.0)
MCH: 32.6 pg (ref 26.0–34.0)
MCHC: 33.7 g/dL (ref 30.0–36.0)
MCV: 96.7 fL (ref 80.0–100.0)
Monocytes Absolute: 0.6 10*3/uL (ref 0.1–1.0)
Monocytes Relative: 7 %
Neutro Abs: 3.5 10*3/uL (ref 1.7–7.7)
Neutrophils Relative %: 41 %
Platelets: 219 10*3/uL (ref 150–400)
RBC: 2.7 MIL/uL — ABNORMAL LOW (ref 4.22–5.81)
RDW: 12.5 % (ref 11.5–15.5)
WBC: 8.3 10*3/uL (ref 4.0–10.5)
nRBC: 0 % (ref 0.0–0.2)

## 2020-03-04 MED ORDER — SODIUM CHLORIDE 0.9 % IV SOLN
240.0000 mg | Freq: Once | INTRAVENOUS | Status: AC
  Administered 2020-03-04: 240 mg via INTRAVENOUS
  Filled 2020-03-04: qty 24

## 2020-03-04 MED ORDER — SODIUM CHLORIDE 0.9 % IV SOLN
Freq: Once | INTRAVENOUS | Status: AC
  Filled 2020-03-04: qty 250

## 2020-03-04 NOTE — Assessment & Plan Note (Addendum)
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 chronic thickening of bladder stable.  Currently on opdivo. STABLE.   #Proceed with Opdivo today. Labs today reviewed;  acceptable for treatment today. Will order scans at next visit.    # Iatrogenic hypothyroidism-on Synthroid 88 mcg; STABLE TSH AUG 2021- WNL.  # Anemia sec to CKD/ on Iv iron.Hb-8.8 iron sats- 39% [June 2021]; STABLE s/p cysto [02/11/20]; monitor for now.   # CKD stage IV-GFR 25-STABLE  # COVID BOOSTER: Discussed given patient's diagnosis and other comorbidities/therapies-patient would be considered immunocompromised.  As per CDC recommendation/FDA approval-I would recommend booster vaccine.  Patient is interested.   # DISPOSITION:  # treatment today;  # Follow-up in 2 weeks- -MD labs-cbc/cmp;opdivo IV; -Dr.B

## 2020-03-04 NOTE — Progress Notes (Signed)
[facility] CONSULT NOTE  Patient Care Team: Masoud, Javed, MD as PCP - General (Internal Medicine) ,  R, MD as Consulting Physician (Hematology and Oncology) Tahiliani, Varnita B, [name]; [name]; [name]; mediastinal uptake; right pubic rami uptake.  # 19thSEP2019- Tecentriq; Jan 18th 2021- switched to Opdivo- [pt preference; [name]; July 2020 cystoscopy-no evidence of bladder malignancy/Dr. Stoioff- enlarged prostate [PSA- 0.95; 2021]  # Molecular testing- PDL-1 CPS- 20%; [name]  ;GOALS: palliative  CURRENT/MOST RECENT THERAPY:OPDIVO [C]     Cancer of overlapping sites of bladder (HCC)  02/28/2018 - 06/29/2019 Chemotherapy   The patient had atezolizumab (TECENTRIQ) 1,[name], 1,200 mg, Intravenous, Once, [name],200 mg (02/28/2018), 1,200 mg (03/21/2018), 1,200 mg (04/11/2018), 1,200 mg (05/02/2018), 1,200 mg (06/13/2018), 1,200 mg (05/23/2018), 1,200 mg (07/04/2018), 1,200 mg (07/25/2018), 1,200 mg (08/15/2018), 1,200 mg (09/05/2018), 1,200 mg (10/25/2018), 1,200 mg (11/22/2018), 1,200 mg (12/20/2018), 1,200 mg (01/10/2019), 1,200 mg (01/31/2019), 1,200 mg (02/21/2019), 1,200 mg (03/14/2019), 1,200 mg (04/04/2019), 1,200 mg (04/25/2019), 1,200 mg (05/16/2019), 1,200 mg (06/09/2019)  for chemotherapy treatment.    06/30/2019 -  Chemotherapy   The patient had nivolumab  (OPDIVO) 240 mg in sodium chloride 0.9 % 100 mL chemo infusion, 240 mg, Intravenous, Once, 15 of 22 cycles Administration: 240 mg (06/30/2019), 240 mg (07/14/2019), 240 mg (07/28/2019), 240 mg (08/11/2019), 240 mg (08/25/2019), 240 mg (09/08/2019), 240 mg (09/22/2019), 240 mg (11/03/2019), 240 mg (11/17/2019), 240 mg (12/10/2019), 240 mg (12/24/2019), 240 mg (01/08/2020), 240 mg (01/22/2020), 240 mg (02/05/2020), 240 mg (02/19/2020)  for chemotherapy treatment.     HISTORY OF PRESENTING ILLNESS: Edward Cummings 86 y.o.  male with metastatic transitional carcinoma of the bladder currently on Opdivo is here for follow-up.  Patient denies any blood in stools or black or stools.  Denies any significant blood in urine.  Appetite is fair.  Denies any pain.  No diarrhea no skin rash.  Continues to complain of mild to moderate fatigue.  Review of Systems  Constitutional: Positive for malaise/fatigue. Negative for chills, [name], hemoptysis, sputum production and wheezing.   Cardiovascular: Negative for chest pain, palpitations, orthopnea and leg swelling.  Gastrointestinal: Negative for abdominal pain, blood in stool, diarrhea, heartburn, melena, nausea and vomiting.  Genitourinary: Positive for frequency and urgency. Negative for dysuria.  Musculoskeletal: Positive for back pain. Negative for joint pain.  Skin: Negative.  Negative for itching and rash.  Neurological: Negative for tingling, focal weakness, weakness and headaches.  Endo/Heme/Allergies: Does not bruise/bleed easily.  Psychiatric/Behavioral: Negative for depression. The patient is not nervous/anxious and does not have insomnia.      MEDICAL HISTORY:  Past Medical History:  Diagnosis Date  . Anemia   . Anxiety 12/31/2019  . Cancer (HCC)    bladder  . Chronic kidney disease   .    Depression   . Hypertension   . Neuromuscular disorder ([place])    Nerve damage to left face/eye since around 2002.    SURGICAL HISTORY: Past Surgical History:  Procedure Laterality Date  . CYSTOSCOPY W/ RETROGRADES Bilateral 01/25/2018   Procedure: CYSTOSCOPY WITH RETROGRADE PYELOGRAM;  [name], MD;  [name];  Service: Urology;  Laterality: Bilateral;  . CYSTOSCOPY W/ URETERAL STENT PLACEMENT Bilateral 01/06/2019   Procedure: CYSTOSCOPY WITH RETROGRADE PYELOGRAM/URETERAL STENT REMOVAL;  [name], MD;  [name];  Service: Urology;  Laterality: Bilateral;  . CYSTOSCOPY WITH STENT PLACEMENT Bilateral 01/25/2018   Procedure: CYSTOSCOPY WITH STENT PLACEMENT;  [name], MD;  [name];  Service: Urology;  Laterality: Bilateral;  . DORSAL SLIT N/A 01/06/2019   Procedure: DORSAL SLIT;  [name], MD;  [name];  Service: Urology;  [name];  . ESOPHAGOGASTRODUODENOSCOPY (EGD) WITH PROPOFOL N/A 11/12/2019   Procedure: ESOPHAGOGASTRODUODENOSCOPY (EGD) WITH PROPOFOL;  Surgeon: Virgel Manifold, MD;  Location: [facility];  Service: Endoscopy;  [name];  . EYE SURGERY     Cornea transplants bilaterally & cataract surgery.  . TRANSURETHRAL RESECTION OF BLADDER TUMOR N/A 01/25/2018   Procedure: TRANSURETHRAL RESECTION OF BLADDER TUMOR (TURBT);  [name], MD;  [name];  Service: Urology;  [name];    SOCIAL HISTORY: lives in [place]; with wife; quit smoking 18 years ago; beer every 2 months or so.mechanic/retd.   Social History   Socioeconomic History  . Marital status: Married    Spouse name: [name]  . Number of children: Not on file  . Years of education: Not on file  . Highest education level: Not on file  Occupational History  . Not on file  Tobacco Use  . Smoking status: Former Smoker    Types: Cigarettes  . Smokeless tobacco:  Current User    Types: Chew  . Tobacco comment: Stopped approximately 10 years ago.  Vaping Use  . Vaping Use: Never used  Substance and Sexual Activity  . Alcohol use: Not Currently    Alcohol/week: 2.0 standard drinks    Types: 2 Cans of beer per week    Comment: Daily  . Drug use: Never  . Sexual activity: Yes    Birth control/protection: None  Other Topics Concern  . Not on file  Social History Narrative  . Not on file   Social Determinants of Health   Financial Resource Strain:   . Difficulty of Paying Living Expenses: Not on file  Food Insecurity:   . Worried About Charity fundraiser in the Last Year: Not on file  . Ran Out of Food in the Last Year: Not on file  Transportation Needs:   . Lack of Transportation (Medical): Not on file  . Lack of Transportation (Non-Medical): Not on file  Physical Activity:   . Days of Exercise per Week: Not on file  . Minutes of Exercise per Session: Not on file  Stress:   . Feeling of Stress : Not on file  Social Connections:   . Frequency of Communication with Friends and Family: Not on file  . Frequency of Social Gatherings with Friends and Family: Not on file  . Attends Religious Services: Not on file  . Active Member of Clubs or Organizations: Not on file  . Attends Archivist Meetings: Not on file  . Marital Status: Not on file  Intimate Partner Violence:   .  Fear of Current or Ex-Partner: Not on file  . Emotionally Abused: Not on file  . Physically Abused: Not on file  . Sexually Abused: Not on file    FAMILY HISTORY: Family History  Problem Relation Age of Onset  . Prostate cancer Neg Hx   . Kidney cancer Neg Hx   . Bladder Cancer Neg Hx     ALLERGIES:  has No Known Allergies.  MEDICATIONS:  Current Outpatient Medications  Medication Sig Dispense Refill  . amLODipine (NORVASC) 5 MG tablet TAKE 1 TABLET BY MOUTH ONCE A DAY 90 tablet 3  . aspirin EC 81 MG tablet Take 81 mg by mouth daily.     Marland Kitchen  docusate sodium (COLACE) 50 MG capsule Take 50 mg by mouth at bedtime.    . fexofenadine (ALLEGRA) 180 MG tablet Take 180 mg by mouth daily.     . fluticasone (FLONASE) 50 MCG/ACT nasal spray Place 2 sprays into both nostrils daily as needed for allergies or rhinitis. 11.1 mL 6  . HYDROcodone-acetaminophen (NORCO/VICODIN) 5-325 MG tablet Take 1 tablet by mouth every 8 (eight) hours as needed for moderate pain. 30 tablet 0  . levothyroxine (SYNTHROID) 88 MCG tablet Take 1 tablet (88 mcg total) by mouth daily before breakfast. Empty stomach- 1 hour prior to breakfast. 90 tablet 3  . loratadine (CLARITIN) 10 MG tablet Take 1 tablet (10 mg total) by mouth daily. 30 tablet 11  . Multiple Vitamin (MULTIVITAMIN WITH MINERALS) TABS tablet Take 1 tablet by mouth daily.     Marland Kitchen oxybutynin (DITROPAN) 5 MG tablet Take 1 tablet by mouth 3 (three) times daily as needed.    . prednisoLONE acetate (PRED FORTE) 1 % ophthalmic suspension Place 1 drop into both eyes daily.    . tamsulosin (FLOMAX) 0.4 MG CAPS capsule Take 1 capsule (0.4 mg total) by mouth at bedtime. 90 capsule 1   No current facility-administered medications for this visit.      Marland Kitchen  PHYSICAL EXAMINATION: ECOG PERFORMANCE STATUS: 1 - Symptomatic but completely ambulatory  Vitals:   03/04/20 0823  BP: (!) 109/44  Pulse: (!) 58  Resp: 14  Temp: (!) 96.7 F (35.9 C)  SpO2: 100%   Filed Weights   03/04/20 0823  Weight: 121 lb (54.9 kg)    Physical Exam Constitutional:      Comments: Walking himself.  Thin built moderately nourished male patient.  HENT:     Head: Normocephalic and atraumatic.     Mouth/Throat:     Pharynx: No oropharyngeal exudate.  Eyes:     Pupils: Pupils are equal, round, and reactive to light.     Comments: Chronic drooping of the left eyelid.  Cardiovascular:     Rate and Rhythm: Normal rate and regular rhythm.  Pulmonary:     Effort: No respiratory distress.     Breath sounds: No wheezing.  Abdominal:      General: Bowel sounds are normal. There is no distension.     Palpations: Abdomen is soft. There is no mass.     Tenderness: There is no abdominal tenderness. There is no guarding or rebound.  Musculoskeletal:        General: No tenderness. Normal range of motion.     Cervical back: Normal range of motion and neck supple.  Skin:    General: Skin is warm.  Neurological:     Mental Status: He is alert and oriented to person, place, and time.  Psychiatric:  Mood and Affect: Affect normal.      LABORATORY DATA:  I have reviewed the data as listed Lab Results  Component Value Date   WBC 8.3 03/04/2020   HGB 8.8 (L) 03/04/2020   HCT 26.1 (L) 03/04/2020   MCV 96.7 03/04/2020   PLT 219 03/04/2020   Recent Labs    02/05/20 0835 02/19/20 0813 03/04/20 0801  NA 135 135 137  K 4.3 4.7 4.7  CL 107 106 107  CO2 21* 21* 23  GLUCOSE 139* 101* 98  BUN 49* 34* 33*  CREATININE 2.30* 2.24* 2.29*  CALCIUM 9.0 8.9 8.9  GFRNONAA 25* 26* 25*  GFRAA 29* 30* 29*  PROT 7.1 7.0 7.0  ALBUMIN 4.0 3.9 4.0  AST _0 ALT _1 ALKPHOS 70 70 65  BILITOT 0.6 0.8 0.6    RADIOGRAPHIC STUDIES: I have personally reviewed the radiological images as listed and agreed with the findings in the report. No results found.  ASSESSMENT & PLAN:   Cancer of overlapping sites of bladder ([place]) # High-grade transitional cell carcinoma of the bladder metastatic to retroperitoneal lymph node.  Stage IV;JUNE 29th 2021-  CT-chest and pelvis-noncontrast- negative for recurrence; prostatomegaly /thickening at the GE junction  [see below]. chronic thickening of bladder stable.  Currently on opdivo. STABLE.   #Proceed with Opdivo today. Labs today reviewed;  acceptable for treatment today. Will order scans at next visit.    # Iatrogenic hypothyroidism-on Synthroid 88 mcg; STABLE TSH AUG 2021- WNL.  # Anemia sec to CKD/ on Iv iron.Hb-8.8 iron sats- 39% [June 2021]; STABLE s/p cysto [02/11/20];  monitor for now.   # CKD stage IV-GFR 25-STABLE  # COVID BOOSTER: Discussed given patient's diagnosis and other comorbidities/therapies-patient would be considered immunocompromised.  As per CDC recommendation/FDA approval-I would recommend booster vaccine.  Patient is interested.   # DISPOSITION:  # treatment today;  # Follow-up in 2 weeks- -MD labs-cbc/cmp;opdivo IV; -Dr.B      All questions were answered. The patient knows to call the clinic with any problems, questions or concerns.    Cammie Sickle, MD 03/04/2020 8:44 AM

## 2020-03-09 ENCOUNTER — Telehealth: Payer: Self-pay | Admitting: Internal Medicine

## 2020-03-09 NOTE — Telephone Encounter (Signed)
On 9/24-spoke to patient's son Legrand Como regarding patient's clinical progress.  Recommend COVID booster shot/flu shot.  Otherwise follow-up as planned

## 2020-03-17 ENCOUNTER — Encounter: Payer: Self-pay | Admitting: Internal Medicine

## 2020-03-17 NOTE — Progress Notes (Signed)
Patient called for pre assessment and denies any questions or concerns at this time. He denies any pain.

## 2020-03-18 ENCOUNTER — Inpatient Hospital Stay: Payer: Medicare HMO | Admitting: Internal Medicine

## 2020-03-18 ENCOUNTER — Inpatient Hospital Stay: Payer: Medicare HMO | Attending: Internal Medicine

## 2020-03-18 ENCOUNTER — Inpatient Hospital Stay: Payer: Medicare HMO

## 2020-03-18 ENCOUNTER — Other Ambulatory Visit: Payer: Self-pay

## 2020-03-18 DIAGNOSIS — Z7982 Long term (current) use of aspirin: Secondary | ICD-10-CM | POA: Diagnosis not present

## 2020-03-18 DIAGNOSIS — Z7189 Other specified counseling: Secondary | ICD-10-CM

## 2020-03-18 DIAGNOSIS — I10 Essential (primary) hypertension: Secondary | ICD-10-CM | POA: Insufficient documentation

## 2020-03-18 DIAGNOSIS — D631 Anemia in chronic kidney disease: Secondary | ICD-10-CM | POA: Insufficient documentation

## 2020-03-18 DIAGNOSIS — Z79899 Other long term (current) drug therapy: Secondary | ICD-10-CM | POA: Diagnosis not present

## 2020-03-18 DIAGNOSIS — F32A Depression, unspecified: Secondary | ICD-10-CM | POA: Diagnosis not present

## 2020-03-18 DIAGNOSIS — E039 Hypothyroidism, unspecified: Secondary | ICD-10-CM | POA: Insufficient documentation

## 2020-03-18 DIAGNOSIS — Z5112 Encounter for antineoplastic immunotherapy: Secondary | ICD-10-CM | POA: Diagnosis present

## 2020-03-18 DIAGNOSIS — N400 Enlarged prostate without lower urinary tract symptoms: Secondary | ICD-10-CM | POA: Diagnosis not present

## 2020-03-18 DIAGNOSIS — N184 Chronic kidney disease, stage 4 (severe): Secondary | ICD-10-CM | POA: Insufficient documentation

## 2020-03-18 DIAGNOSIS — C678 Malignant neoplasm of overlapping sites of bladder: Secondary | ICD-10-CM

## 2020-03-18 DIAGNOSIS — Z87891 Personal history of nicotine dependence: Secondary | ICD-10-CM | POA: Diagnosis not present

## 2020-03-18 DIAGNOSIS — C772 Secondary and unspecified malignant neoplasm of intra-abdominal lymph nodes: Secondary | ICD-10-CM | POA: Insufficient documentation

## 2020-03-18 DIAGNOSIS — R69 Illness, unspecified: Secondary | ICD-10-CM | POA: Diagnosis not present

## 2020-03-18 LAB — CBC WITH DIFFERENTIAL/PLATELET
Abs Immature Granulocytes: 0.01 10*3/uL (ref 0.00–0.07)
Basophils Absolute: 0.1 10*3/uL (ref 0.0–0.1)
Basophils Relative: 1 %
Eosinophils Absolute: 1 10*3/uL — ABNORMAL HIGH (ref 0.0–0.5)
Eosinophils Relative: 14 %
HCT: 24.7 % — ABNORMAL LOW (ref 39.0–52.0)
Hemoglobin: 8.3 g/dL — ABNORMAL LOW (ref 13.0–17.0)
Immature Granulocytes: 0 %
Lymphocytes Relative: 32 %
Lymphs Abs: 2.2 10*3/uL (ref 0.7–4.0)
MCH: 32.7 pg (ref 26.0–34.0)
MCHC: 33.6 g/dL (ref 30.0–36.0)
MCV: 97.2 fL (ref 80.0–100.0)
Monocytes Absolute: 0.5 10*3/uL (ref 0.1–1.0)
Monocytes Relative: 8 %
Neutro Abs: 3.1 10*3/uL (ref 1.7–7.7)
Neutrophils Relative %: 45 %
Platelets: 222 10*3/uL (ref 150–400)
RBC: 2.54 MIL/uL — ABNORMAL LOW (ref 4.22–5.81)
RDW: 12.3 % (ref 11.5–15.5)
WBC: 6.8 10*3/uL (ref 4.0–10.5)
nRBC: 0 % (ref 0.0–0.2)

## 2020-03-18 LAB — COMPREHENSIVE METABOLIC PANEL
ALT: 14 U/L (ref 0–44)
AST: 19 U/L (ref 15–41)
Albumin: 4 g/dL (ref 3.5–5.0)
Alkaline Phosphatase: 66 U/L (ref 38–126)
Anion gap: 7 (ref 5–15)
BUN: 39 mg/dL — ABNORMAL HIGH (ref 8–23)
CO2: 22 mmol/L (ref 22–32)
Calcium: 9.2 mg/dL (ref 8.9–10.3)
Chloride: 109 mmol/L (ref 98–111)
Creatinine, Ser: 2.25 mg/dL — ABNORMAL HIGH (ref 0.61–1.24)
GFR calc non Af Amer: 26 mL/min — ABNORMAL LOW (ref >60)
Glucose, Bld: 98 mg/dL (ref 70–99)
Potassium: 4.9 mmol/L (ref 3.5–5.1)
Sodium: 138 mmol/L (ref 135–145)
Total Bilirubin: 0.8 mg/dL (ref 0.3–1.2)
Total Protein: 7.1 g/dL (ref 6.5–8.1)

## 2020-03-18 MED ORDER — SODIUM CHLORIDE 0.9 % IV SOLN
Freq: Once | INTRAVENOUS | Status: AC
  Filled 2020-03-18: qty 250

## 2020-03-18 MED ORDER — SODIUM CHLORIDE 0.9 % IV SOLN
240.0000 mg | Freq: Once | INTRAVENOUS | Status: AC
  Administered 2020-03-18: 240 mg via INTRAVENOUS
  Filled 2020-03-18: qty 24

## 2020-03-18 NOTE — Assessment & Plan Note (Addendum)
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 chronic thickening of bladder stable.  Currently on opdivo. STABLE.  #Proceed with Opdivo today. Labs today reviewed;  acceptable for treatment today. Will order scans at next visit.   # Iatrogenic hypothyroidism-on Synthroid 88 mcg; STABLE TSH AUG 2021- WNL.  # Anemia sec to CKD/ on Iv iron.Hb-8.3 iron sats- 39% [June 2021]; STABLE;  s/p cysto [02/11/20]; monitor for now.   # CKD stage IV-GFR 25-STABLE  # DISPOSITION:  # treatment today;  # Follow-up in 2 weeks- -MD labs-cbc/cmp;opdivo IV; IV venofer; -Dr.B

## 2020-03-18 NOTE — Progress Notes (Signed)
[place] NOTE  Patient Care Team: [name], [name], [name], [name]; [name]; [name]; mediastinal uptake; right pubic rami uptake.  # [id number] Cummings; Jan 18th 2021- switched to [place]- [pt preference; [name]; July 2020 cystoscopy-no evidence of bladder malignancy/Dr. Bernardo Heater- enlarged prostate [PSA- 0.95; 2021]  # Molecular testing- PDL-1 CPS- 20%; [name]  ;GOALS: palliative  CURRENT/MOST RECENT THERAPY:OPDIVO [C]     Cancer of overlapping sites of bladder ([place])  02/28/2018 - 06/09/2019 Chemotherapy   The patient had atezolizumab (TECENTRIQ) 1,[name], 1,200 mg, Intravenous, Once, [name],200 mg (02/28/2018), 1,200 mg (03/21/2018), 1,200 mg (04/11/2018), 1,200 mg (05/02/2018), 1,200 mg (06/13/2018), 1,200 mg (05/23/2018), 1,200 mg (07/04/2018), 1,200 mg (07/25/2018), 1,200 mg (08/15/2018), 1,200 mg (09/05/2018), 1,200 mg (10/25/2018), 1,200 mg (11/22/2018), 1,200 mg (12/20/2018), 1,200 mg (01/10/2019), 1,200 mg (01/31/2019), 1,200 mg (02/21/2019), 1,200 mg (03/14/2019), 1,200 mg (04/04/2019), 1,200 mg (04/25/2019), 1,200 mg (05/16/2019), 1,200 mg (06/09/2019)  for chemotherapy treatment.    06/30/2019 -  Chemotherapy   The patient had nivolumab  (OPDIVO) 240 mg in sodium chloride 0.9 % 100 mL chemo infusion, 240 mg, Intravenous, Once, 16 of 22 cycles Administration: 240 mg (06/30/2019), 240 mg (07/14/2019), 240 mg (07/28/2019), 240 mg (08/11/2019), 240 mg (08/25/2019), 240 mg (09/08/2019), 240 mg (09/22/2019), 240 mg (11/03/2019), 240 mg (11/17/2019), 240 mg (12/10/2019), 240 mg (12/24/2019), 240 mg (01/08/2020), 240 mg (01/22/2020), 240 mg (02/05/2020), 240 mg (02/19/2020), 240 mg (03/04/2020)  for chemotherapy treatment.     HISTORY OF PRESENTING ILLNESS: Edward Cummings 86 y.o.  male with metastatic transitional carcinoma of the bladder currently on Opdivo is here for follow-up.  Patient denies any blood in stools or black or stools.  Denies any significant blood in urine.  Appetite is fair.  Denies any pain.  No diarrhea no skin rash.  Continues to complain of mild to moderate fatigue.  Review of Systems  Constitutional: Positive for malaise/fatigue. Negative for chills, [name], hemoptysis, sputum production and wheezing.   Cardiovascular: Negative for chest pain, palpitations, orthopnea and leg swelling.  Gastrointestinal: Negative for abdominal pain, blood in stool, diarrhea, heartburn, melena, nausea and vomiting.  Genitourinary: Positive for frequency and urgency. Negative for dysuria.  Musculoskeletal: Positive for back pain. Negative for joint pain.  Skin: Negative.  Negative for itching and rash.  Neurological: Negative for tingling, focal weakness, weakness and headaches.  Endo/Heme/Allergies: Does not bruise/bleed easily.  Psychiatric/Behavioral: Negative for depression. The patient is not nervous/anxious and does not have insomnia.      MEDICAL HISTORY:  Past Medical History:  Diagnosis Date  . Anemia   . Anxiety 12/31/2019  . Cancer ([place])    bladder  .  Chronic kidney  disease   . Depression   . Hypertension   . Neuromuscular disorder ([place])    Nerve damage to left face/eye since around 2002.    SURGICAL HISTORY: Past Surgical History:  Procedure Laterality Date  . CYSTOSCOPY W/ RETROGRADES Bilateral 01/25/2018   Procedure: CYSTOSCOPY WITH RETROGRADE PYELOGRAM;  [name], MD;  [name];  Service: Urology;  Laterality: Bilateral;  . CYSTOSCOPY W/ URETERAL STENT PLACEMENT Bilateral 01/06/2019   Procedure: CYSTOSCOPY WITH RETROGRADE PYELOGRAM/URETERAL STENT REMOVAL;  [name], MD;  [name];  Service: Urology;  Laterality: Bilateral;  . CYSTOSCOPY WITH STENT PLACEMENT Bilateral 01/25/2018   Procedure: CYSTOSCOPY WITH STENT PLACEMENT;  [name], MD;  [name];  Service: Urology;  Laterality: Bilateral;  . DORSAL SLIT N/A 01/06/2019   Procedure: DORSAL SLIT;  [name], MD;  [name];  Service: Urology;  [name];  . ESOPHAGOGASTRODUODENOSCOPY (EGD) WITH PROPOFOL N/A 11/12/2019   Procedure: ESOPHAGOGASTRODUODENOSCOPY (EGD) WITH PROPOFOL;  Surgeon: Virgel Manifold, MD;  Location: [facility];  Service: Endoscopy;  [name];  . EYE SURGERY     Cornea transplants bilaterally & cataract surgery.  . TRANSURETHRAL RESECTION OF BLADDER TUMOR N/A 01/25/2018   Procedure: TRANSURETHRAL RESECTION OF BLADDER TUMOR (TURBT);  [name], MD;  [name];  Service: Urology;  [name];    SOCIAL HISTORY: lives in [place]; with wife; quit smoking 18 years ago; beer every 2 months or so.mechanic/retd.   Social History   Socioeconomic History  . Marital status: Married    Spouse name: [name]  . Number of children: Not on file  . Years of education: Not on file  . Highest education level: Not on file  Occupational History  . Not on file  Tobacco Use  . Smoking status: Former Smoker    Types: Cigarettes  .  Smokeless tobacco: Current User    Types: Chew  . Tobacco comment: Stopped approximately 10 years ago.  Vaping Use  . Vaping Use: Never used  Substance and Sexual Activity  . Alcohol use: Not Currently    Alcohol/week: 2.0 standard drinks    Types: 2 Cans of beer per week    Comment: Daily  . Drug use: Never  . Sexual activity: Yes    Birth control/protection: None  Other Topics Concern  . Not on file  Social History Narrative  . Not on file   Social Determinants of Health   Financial Resource Strain:   . Difficulty of Paying Living Expenses: Not on file  Food Insecurity:   . Worried About Charity fundraiser in the Last Year: Not on file  . Ran Out of Food in the Last Year: Not on file  Transportation Needs:   . Lack of Transportation (Medical): Not on file  . Lack of Transportation (Non-Medical): Not on file  Physical Activity:   . Days of Exercise per Week: Not on file  . Minutes of Exercise per Session: Not on file  Stress:   . Feeling of Stress : Not on file  Social Connections:   . Frequency of Communication with Friends and Family: Not on file  . Frequency of Social Gatherings with Friends and Family: Not on file  . Attends Religious Services: Not on file  . Active Member of Clubs or Organizations: Not on file  . Attends Archivist Meetings: Not on file  . Marital Status: Not  on file  Intimate Partner Violence:   . Fear of Current or Ex-Partner: Not on file  . Emotionally Abused: Not on file  . Physically Abused: Not on file  . Sexually Abused: Not on file    FAMILY HISTORY: Family History  Problem Relation Age of Onset  . Prostate cancer Neg Hx   . Kidney cancer Neg Hx   . Bladder Cancer Neg Hx     ALLERGIES:  has No Known Allergies.  MEDICATIONS:  Current Outpatient Medications  Medication Sig Dispense Refill  . amLODipine (NORVASC) 5 MG tablet TAKE 1 TABLET BY MOUTH ONCE A DAY 90 tablet 3  . aspirin EC 81 MG tablet Take 81 mg by mouth  daily.     Marland Kitchen docusate sodium (COLACE) 50 MG capsule Take 50 mg by mouth at bedtime.    . fexofenadine (ALLEGRA) 180 MG tablet Take 180 mg by mouth daily.     . fluticasone (FLONASE) 50 MCG/ACT nasal spray Place 2 sprays into both nostrils daily as needed for allergies or rhinitis. 11.1 mL 6  . HYDROcodone-acetaminophen (NORCO/VICODIN) 5-325 MG tablet Take 1 tablet by mouth every 8 (eight) hours as needed for moderate pain. 30 tablet 0  . levothyroxine (SYNTHROID) 88 MCG tablet Take 1 tablet (88 mcg total) by mouth daily before breakfast. Empty stomach- 1 hour prior to breakfast. 90 tablet 3  . loratadine (CLARITIN) 10 MG tablet Take 1 tablet (10 mg total) by mouth daily. 30 tablet 11  . Multiple Vitamin (MULTIVITAMIN WITH MINERALS) TABS tablet Take 1 tablet by mouth daily.     Marland Kitchen oxybutynin (DITROPAN) 5 MG tablet Take 1 tablet by mouth 3 (three) times daily as needed.    . prednisoLONE acetate (PRED FORTE) 1 % ophthalmic suspension Place 1 drop into both eyes daily.    . tamsulosin (FLOMAX) 0.4 MG CAPS capsule Take 1 capsule (0.4 mg total) by mouth at bedtime. 90 capsule 1   No current facility-administered medications for this visit.      Marland Kitchen  PHYSICAL EXAMINATION: ECOG PERFORMANCE STATUS: 1 - Symptomatic but completely ambulatory  There were no vitals filed for this visit. There were no vitals filed for this visit.  Physical Exam Constitutional:      Comments: Walking himself.  Thin built moderately nourished male patient.  HENT:     Head: Normocephalic and atraumatic.     Mouth/Throat:     Pharynx: No oropharyngeal exudate.  Eyes:     Pupils: Pupils are equal, round, and reactive to light.     Comments: Chronic drooping of the left eyelid.  Cardiovascular:     Rate and Rhythm: Normal rate and regular rhythm.  Pulmonary:     Effort: No respiratory distress.     Breath sounds: No wheezing.  Abdominal:     General: Bowel sounds are normal. There is no distension.     Palpations:  Abdomen is soft. There is no mass.     Tenderness: There is no abdominal tenderness. There is no guarding or rebound.  Musculoskeletal:        General: No tenderness. Normal range of motion.     Cervical back: Normal range of motion and neck supple.  Skin:    General: Skin is warm.  Neurological:     Mental Status: He is alert and oriented to person, place, and time.  Psychiatric:        Mood and Affect: Affect normal.      LABORATORY DATA:  I  have reviewed the data as listed Lab Results  Component Value Date   WBC 6.8 03/18/2020   HGB 8.3 (L) 03/18/2020   HCT 24.7 (L) 03/18/2020   MCV 97.2 03/18/2020   PLT 222 03/18/2020   Recent Labs    02/05/20 0835 02/05/20 0835 02/19/20 0813 03/04/20 0801 03/18/20 0819  NA 135   < > 135 137 138  K 4.3   < > 4.7 4.7 4.9  CL 107   < > 106 107 109  CO2 21*   < > 21* 23 22  GLUCOSE 139*   < > 101* 98 98  BUN 49*   < > 34* 33* 39*  CREATININE 2.30*   < > 2.24* 2.29* 2.25*  CALCIUM 9.0   < > 8.9 8.9 9.2  GFRNONAA 25*   < > 26* 25* 26*  GFRAA 29*  --  30* 29*  --   PROT 7.1   < > 7.0 7.0 7.1  ALBUMIN 4.0   < > 3.9 4.0 4.0  AST 20   < > _0 ALT 13   < > _1 ALKPHOS 70   < > 70 65 66  BILITOT 0.6   < > 0.8 0.6 0.8   < > = values in this interval not displayed.    RADIOGRAPHIC STUDIES: I have personally reviewed the radiological images as listed and agreed with the findings in the report. No results found.  ASSESSMENT & PLAN:   No problem-specific Assessment & Plan notes found for this encounter.  All questions were answered. The patient knows to call the clinic with any problems, questions or concerns.    Cammie Sickle, MD 03/18/2020 8:48 AM

## 2020-04-01 ENCOUNTER — Encounter: Payer: Self-pay | Admitting: Internal Medicine

## 2020-04-01 ENCOUNTER — Inpatient Hospital Stay: Payer: Medicare HMO

## 2020-04-01 ENCOUNTER — Telehealth: Payer: Self-pay | Admitting: Internal Medicine

## 2020-04-01 ENCOUNTER — Other Ambulatory Visit: Payer: Self-pay

## 2020-04-01 ENCOUNTER — Inpatient Hospital Stay: Payer: Medicare HMO | Admitting: Internal Medicine

## 2020-04-01 VITALS — BP 92/56 | HR 62 | Temp 97.5°F | Resp 18 | Wt 120.0 lb

## 2020-04-01 VITALS — BP 123/63 | HR 51 | Resp 16

## 2020-04-01 DIAGNOSIS — C678 Malignant neoplasm of overlapping sites of bladder: Secondary | ICD-10-CM

## 2020-04-01 DIAGNOSIS — Z7189 Other specified counseling: Secondary | ICD-10-CM

## 2020-04-01 DIAGNOSIS — D631 Anemia in chronic kidney disease: Secondary | ICD-10-CM

## 2020-04-01 DIAGNOSIS — Z5112 Encounter for antineoplastic immunotherapy: Secondary | ICD-10-CM | POA: Diagnosis not present

## 2020-04-01 DIAGNOSIS — N1831 Chronic kidney disease, stage 3a: Secondary | ICD-10-CM

## 2020-04-01 LAB — CBC WITH DIFFERENTIAL/PLATELET
Abs Immature Granulocytes: 0.02 10*3/uL (ref 0.00–0.07)
Basophils Absolute: 0.1 10*3/uL (ref 0.0–0.1)
Basophils Relative: 1 %
Eosinophils Absolute: 1.2 10*3/uL — ABNORMAL HIGH (ref 0.0–0.5)
Eosinophils Relative: 17 %
HCT: 26.7 % — ABNORMAL LOW (ref 39.0–52.0)
Hemoglobin: 8.9 g/dL — ABNORMAL LOW (ref 13.0–17.0)
Immature Granulocytes: 0 %
Lymphocytes Relative: 27 %
Lymphs Abs: 1.9 10*3/uL (ref 0.7–4.0)
MCH: 32.8 pg (ref 26.0–34.0)
MCHC: 33.3 g/dL (ref 30.0–36.0)
MCV: 98.5 fL (ref 80.0–100.0)
Monocytes Absolute: 0.5 10*3/uL (ref 0.1–1.0)
Monocytes Relative: 6 %
Neutro Abs: 3.5 10*3/uL (ref 1.7–7.7)
Neutrophils Relative %: 49 %
Platelets: 222 10*3/uL (ref 150–400)
RBC: 2.71 MIL/uL — ABNORMAL LOW (ref 4.22–5.81)
RDW: 12.5 % (ref 11.5–15.5)
WBC: 7.1 10*3/uL (ref 4.0–10.5)
nRBC: 0 % (ref 0.0–0.2)

## 2020-04-01 LAB — COMPREHENSIVE METABOLIC PANEL
ALT: 13 U/L (ref 0–44)
AST: 20 U/L (ref 15–41)
Albumin: 3.8 g/dL (ref 3.5–5.0)
Alkaline Phosphatase: 69 U/L (ref 38–126)
Anion gap: 7 (ref 5–15)
BUN: 34 mg/dL — ABNORMAL HIGH (ref 8–23)
CO2: 23 mmol/L (ref 22–32)
Calcium: 9 mg/dL (ref 8.9–10.3)
Chloride: 107 mmol/L (ref 98–111)
Creatinine, Ser: 2.11 mg/dL — ABNORMAL HIGH (ref 0.61–1.24)
GFR, Estimated: 30 mL/min — ABNORMAL LOW (ref >60)
Glucose, Bld: 126 mg/dL — ABNORMAL HIGH (ref 70–99)
Potassium: 4.3 mmol/L (ref 3.5–5.1)
Sodium: 137 mmol/L (ref 135–145)
Total Bilirubin: 0.5 mg/dL (ref 0.3–1.2)
Total Protein: 6.9 g/dL (ref 6.5–8.1)

## 2020-04-01 MED ORDER — SODIUM CHLORIDE 0.9 % IV SOLN
240.0000 mg | Freq: Once | INTRAVENOUS | Status: AC
  Administered 2020-04-01: 240 mg via INTRAVENOUS
  Filled 2020-04-01: qty 24

## 2020-04-01 MED ORDER — IRON SUCROSE 20 MG/ML IV SOLN
200.0000 mg | Freq: Once | INTRAVENOUS | Status: AC
  Administered 2020-04-01: 200 mg via INTRAVENOUS
  Filled 2020-04-01: qty 10

## 2020-04-01 MED ORDER — SODIUM CHLORIDE 0.9 % IV SOLN
Freq: Once | INTRAVENOUS | Status: AC
  Filled 2020-04-01: qty 250

## 2020-04-01 NOTE — Assessment & Plan Note (Addendum)
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  Stage IV; JUNE 29th 2021-  CT-chest and pelvis-noncontrast- negative for recurrence; prostatomegaly /thickening at the GE junction  [see below]. chronic thickening of bladder stable.  Currently on opdivo. STABLE  #Proceed with Opdivo today. Labs today reviewed;  acceptable for treatment today. Will order scans today.   # Relative Hypotension- 90s;HR-WNL; no major symptoms; HOLD norvasc.  Will reevaluate at next visit.  Written instructions given.  # Iatrogenic hypothyroidism-on Synthroid 88 mcg; STABLE TSH AUG 2021- WNL.  Stable  # Anemia sec to CKD/ on Iv iron.Hb-8.9 iron sats- 39% [June 2021]; STABLE s/p cysto [02/11/20]; add iron infusions today.  # CKD stage IV-GFR 25-STABLE  # DISPOSITION:  # treatment today; IV vefneor today # Follow-up in 2 weeks- -MD labs-cbc/cmp;opdivo IV; IV venofer; CT C/A/P- -Dr.B

## 2020-04-01 NOTE — Addendum Note (Signed)
Addended by: Delice Bison E on: 04/01/2020 10:01 AM   Modules accepted: Orders

## 2020-04-01 NOTE — Telephone Encounter (Signed)
On 10/21- spoke to pt' son with an update.  Recommend holding amlodipine given his low blood pressures.

## 2020-04-01 NOTE — Patient Instructions (Signed)
#   STOP AMLODOPINE [blood pressure medication]- as your blood pressure was slightly low. We recheck at next visit.

## 2020-04-01 NOTE — Progress Notes (Signed)
[place] NOTE  Patient Care Team: [name], [name], [name], [name]; [name]; [name]; mediastinal uptake; right pubic rami uptake.  # [name]; Jan 18th 2021- switched to [place]- [pt preference; [name]; July 2020 cystoscopy-no evidence of bladder malignancy/Dr. Bernardo Heater- enlarged prostate [PSA- 0.95; 2021]  # Molecular testing- PDL-1 CPS- 20%; [name]  ;GOALS: palliative  CURRENT/MOST RECENT THERAPY:OPDIVO [C]     Cancer of overlapping sites of bladder ([place])  02/28/2018 - 06/09/2019 Chemotherapy   The patient had atezolizumab (TECENTRIQ) 1,[name], 1,200 mg, Intravenous, Once, [name],200 mg (02/28/2018), 1,200 mg (03/21/2018), 1,200 mg (04/11/2018), 1,200 mg (05/02/2018), 1,200 mg (06/13/2018), 1,200 mg (05/23/2018), 1,200 mg (07/04/2018), 1,200 mg (07/25/2018), 1,200 mg (08/15/2018), 1,200 mg (09/05/2018), 1,200 mg (10/25/2018), 1,200 mg (11/22/2018), 1,200 mg (12/20/2018), 1,200 mg (01/10/2019), 1,200 mg (01/31/2019), 1,200 mg (02/21/2019), 1,200 mg (03/14/2019), 1,200 mg (04/04/2019), 1,200 mg (04/25/2019), 1,200 mg (05/16/2019), 1,200 mg (06/09/2019)  for chemotherapy treatment.    06/30/2019 -  Chemotherapy   The patient had nivolumab  (OPDIVO) 240 mg in sodium chloride 0.9 % 100 mL chemo infusion, 240 mg, Intravenous, Once, 17 of 22 cycles Administration: 240 mg (06/30/2019), 240 mg (07/14/2019), 240 mg (07/28/2019), 240 mg (08/11/2019), 240 mg (08/25/2019), 240 mg (09/08/2019), 240 mg (09/22/2019), 240 mg (11/03/2019), 240 mg (11/17/2019), 240 mg (12/10/2019), 240 mg (12/24/2019), 240 mg (01/08/2020), 240 mg (01/22/2020), 240 mg (02/05/2020), 240 mg (02/19/2020), 240 mg (03/04/2020), 240 mg (03/18/2020)  for chemotherapy treatment.     HISTORY OF PRESENTING ILLNESS: Edward Cummings 86 y.o.  male with metastatic transitional carcinoma of the bladder currently on Opdivo is here for follow-up.  Patient denies any chest pain or dizziness.  Slight lightheadedness when standing.  No diarrhea.  No falls.  No nausea no vomiting.  Appetite is good.  Weight is stable.  No blood in stool black or stools.  Chronic mild to moderate fatigue.  No skin rash.  Review of Systems  Constitutional: Positive for malaise/fatigue. Negative for chills, [name], hemoptysis, sputum production and wheezing.   Cardiovascular: Negative for chest pain, palpitations, orthopnea and leg swelling.  Gastrointestinal: Negative for abdominal pain, blood in stool, diarrhea, heartburn, melena, nausea and vomiting.  Genitourinary: Positive for frequency and urgency. Negative for dysuria.  Musculoskeletal: Positive for back pain. Negative for joint pain.  Skin: Negative.  Negative for itching and rash.  Neurological: Negative for tingling, focal weakness, weakness and headaches.  Endo/Heme/Allergies: Does not bruise/bleed easily.  Psychiatric/Behavioral: Negative for depression. The patient is not nervous/anxious and does not have insomnia.      MEDICAL HISTORY:  Past Medical History:  Diagnosis Date  . Anemia   .  Anxiety 12/31/2019  . Cancer (HCC)    bladder  . Chronic kidney disease   . Depression   . Hypertension   . Neuromuscular disorder (HCC)    Nerve damage to left face/eye since around 2002.    SURGICAL HISTORY: Past Surgical History:  Procedure Laterality Date  . CYSTOSCOPY W/ RETROGRADES Bilateral 01/25/2018   Procedure: CYSTOSCOPY WITH RETROGRADE PYELOGRAM;  Surgeon: Stoioff, Scott C, MD;  [name];  Service: Urology;  Laterality: Bilateral;  . CYSTOSCOPY W/ URETERAL STENT PLACEMENT Bilateral 01/06/2019   Procedure: CYSTOSCOPY WITH RETROGRADE PYELOGRAM/URETERAL STENT REMOVAL;  Surgeon: Stoioff, Scott C, MD;  [name];  Service: Urology;  Laterality: Bilateral;  . CYSTOSCOPY WITH STENT PLACEMENT Bilateral 01/25/2018   Procedure: CYSTOSCOPY WITH STENT PLACEMENT;  Surgeon: Stoioff, Scott C, MD;  [name];  Service: Urology;  Laterality: Bilateral;  . DORSAL SLIT N/A 01/06/2019   Procedure: DORSAL SLIT;  Surgeon: Stoioff, Scott C, MD;  [name];  Service: Urology;  [name];  . ESOPHAGOGASTRODUODENOSCOPY (EGD) WITH PROPOFOL N/A 11/12/2019   Procedure: ESOPHAGOGASTRODUODENOSCOPY (EGD) WITH PROPOFOL;  Surgeon: Tahiliani, Varnita B, MD;  Location: [facility];  Service: Endoscopy;  [name];  . EYE SURGERY     Cornea transplants bilaterally & cataract surgery.  . TRANSURETHRAL RESECTION OF BLADDER TUMOR N/A 01/25/2018   Procedure: TRANSURETHRAL RESECTION OF BLADDER TUMOR (TURBT);  Surgeon: Stoioff, Scott C, MD;  [name];  Service: Urology;  [name];    SOCIAL HISTORY: lives in [place]; with wife; quit smoking 18 years ago; beer every 2 months or so.mechanic/retd.   Social History   Socioeconomic History  . Marital status: Married    Spouse name: Shirley  . Number of children: Not on file  . Years of education: Not on file  . Highest education level: Not on file  Occupational History  . Not on file  Tobacco Use   . Smoking status: Former Smoker    Types: Cigarettes  . Smokeless tobacco: Current User    Types: Chew  . Tobacco comment: Stopped approximately 10 years ago.  Vaping Use  . Vaping Use: Never used  Substance and Sexual Activity  . Alcohol use: Not Currently    Alcohol/week: 2.0 standard drinks    Types: 2 Cans of beer per week    Comment: Daily  . Drug use: Never  . Sexual activity: Yes    Birth control/protection: None  Other Topics Concern  . Not on file  Social History Narrative  . Not on file   Social Determinants of Health   Financial Resource Strain:   . Difficulty of Paying Living Expenses: Not on file  Food Insecurity:   . Worried About Running Out of Food in the Last Year: Not on file  . Ran Out of Food in the Last Year: Not on file  Transportation Needs:   . Lack of Transportation (Medical): Not on file  . Lack of Transportation (Non-Medical): Not on file  Physical Activity:   . Days of Exercise per Week: Not on file  . Minutes of Exercise per Session: Not on file  Stress:   . Feeling of Stress : Not on file  Social Connections:   . Frequency of Communication with Friends and Family: Not on file  . Frequency of Social Gatherings with Friends and Family: Not on file  . Attends Religious Services: Not on file  . Active Member of Clubs or Organizations: Not on file  . Attends Club   or Organization Meetings: Not on file  . Marital Status: Not on file  Intimate Partner Violence:   . Fear of Current or Ex-Partner: Not on file  . Emotionally Abused: Not on file  . Physically Abused: Not on file  . Sexually Abused: Not on file    FAMILY HISTORY: Family History  Problem Relation Age of Onset  . Prostate cancer Neg Hx   . Kidney cancer Neg Hx   . Bladder Cancer Neg Hx     ALLERGIES:  has No Known Allergies.  MEDICATIONS:  Current Outpatient Medications  Medication Sig Dispense Refill  . amLODipine (NORVASC) 5 MG tablet TAKE 1 TABLET BY MOUTH ONCE A DAY  90 tablet 3  . aspirin EC 81 MG tablet Take 81 mg by mouth daily.     . docusate sodium (COLACE) 50 MG capsule Take 50 mg by mouth at bedtime.    . fexofenadine (ALLEGRA) 180 MG tablet Take 180 mg by mouth daily.     . fluticasone (FLONASE) 50 MCG/ACT nasal spray Place 2 sprays into both nostrils daily as needed for allergies or rhinitis. 11.1 mL 6  . levothyroxine (SYNTHROID) 88 MCG tablet Take 1 tablet (88 mcg total) by mouth daily before breakfast. Empty stomach- 1 hour prior to breakfast. 90 tablet 3  . loratadine (CLARITIN) 10 MG tablet Take 1 tablet (10 mg total) by mouth daily. 30 tablet 11  . Multiple Vitamin (MULTIVITAMIN WITH MINERALS) TABS tablet Take 1 tablet by mouth daily.     . prednisoLONE acetate (PRED FORTE) 1 % ophthalmic suspension Place 1 drop into both eyes daily.    . HYDROcodone-acetaminophen (NORCO/VICODIN) 5-325 MG tablet Take 1 tablet by mouth every 8 (eight) hours as needed for moderate pain. (Patient not taking: Reported on 04/01/2020) 30 tablet 0  . oxybutynin (DITROPAN) 5 MG tablet Take 1 tablet by mouth 3 (three) times daily as needed. (Patient not taking: Reported on 04/01/2020)    . tamsulosin (FLOMAX) 0.4 MG CAPS capsule Take 1 capsule (0.4 mg total) by mouth at bedtime. (Patient not taking: Reported on 04/01/2020) 90 capsule 1   No current facility-administered medications for this visit.      .  PHYSICAL EXAMINATION: ECOG PERFORMANCE STATUS: 1 - Symptomatic but completely ambulatory  Vitals:   04/01/20 0850  BP: (!) 92/56  Pulse: 62  Resp: 18  Temp: (!) 97.5 F (36.4 C)  SpO2: 100%   Filed Weights   04/01/20 0850  Weight: 120 lb (54.4 kg)    Physical Exam Constitutional:      Comments: Walking himself.  Thin built moderately nourished male patient.  HENT:     Head: Normocephalic and atraumatic.     Mouth/Throat:     Pharynx: No oropharyngeal exudate.  Eyes:     Pupils: Pupils are equal, round, and reactive to light.     Comments:  Chronic drooping of the left eyelid.  Cardiovascular:     Rate and Rhythm: Normal rate and regular rhythm.  Pulmonary:     Effort: No respiratory distress.     Breath sounds: No wheezing.  Abdominal:     General: Bowel sounds are normal. There is no distension.     Palpations: Abdomen is soft. There is no mass.     Tenderness: There is no abdominal tenderness. There is no guarding or rebound.  Musculoskeletal:        General: No tenderness. Normal range of motion.     Cervical back: Normal range of   motion and neck supple.  Skin:    General: Skin is warm.  Neurological:     Mental Status: He is alert and oriented to person, place, and time.  Psychiatric:        Mood and Affect: Affect normal.      LABORATORY DATA:  I have reviewed the data as listed Lab Results  Component Value Date   WBC 7.1 04/01/2020   HGB 8.9 (L) 04/01/2020   HCT 26.7 (L) 04/01/2020   MCV 98.5 04/01/2020   PLT 222 04/01/2020   Recent Labs    02/05/20 0835 02/05/20 0835 02/19/20 0813 02/19/20 0813 03/04/20 0801 03/18/20 0819 04/01/20 0815  NA 135   < > 135   < > 137 138 137  K 4.3   < > 4.7   < > 4.7 4.9 4.3  CL 107   < > 106   < > 107 109 107  CO2 21*   < > 21*   < > _0 GLUCOSE 139*   < > 101*   < > 98 98 126*  BUN 49*   < > 34*   < > 33* 39* 34*  CREATININE 2.30*   < > 2.24*   < > 2.29* 2.25* 2.11*  CALCIUM 9.0   < > 8.9   < > 8.9 9.2 9.0  GFRNONAA 25*   < > 26*   < > 25* 26* 30*  GFRAA 29*  --  30*  --  29*  --   --   PROT 7.1   < > 7.0   < > 7.0 7.1 6.9  ALBUMIN 4.0   < > 3.9   < > 4.0 4.0 3.8  AST 20   < > 17   < > _1 ALT 13   < > 12   < > _2 ALKPHOS 70   < > 70   < > 65 66 69  BILITOT 0.6   < > 0.8   < > 0.6 0.8 0.5   < > = values in this interval not displayed.    RADIOGRAPHIC STUDIES: I have personally reviewed the radiological images as listed and agreed with the findings in the report. No results found.  ASSESSMENT & PLAN:   Cancer of overlapping  sites of bladder ([place]) # High-grade transitional cell carcinoma of the bladder metastatic to retroperitoneal lymph node.  Stage IV; JUNE 29th 2021-  CT-chest and pelvis-noncontrast- negative for recurrence; prostatomegaly /thickening at the GE junction  [see below]. chronic thickening of bladder stable.  Currently on opdivo. STABLE  #Proceed with Opdivo today. Labs today reviewed;  acceptable for treatment today. Will order scans today.   # Relative Hypotension- 90s;HR-WNL; no major symptoms; HOLD norvasc.  Will reevaluate at next visit.  Written instructions given.  # Iatrogenic hypothyroidism-on Synthroid 88 mcg; STABLE TSH AUG 2021- WNL.  Stable  # Anemia sec to CKD/ on Iv iron.Hb-8.9 iron sats- 39% [June 2021]; STABLE s/p cysto [02/11/20]; add iron infusions today.  # CKD stage IV-GFR 25-STABLE  # DISPOSITION:  # treatment today; IV vefneor today # Follow-up in 2 weeks- -MD labs-cbc/cmp;opdivo IV; IV venofer; CT C/A/P- -Dr.B   All questions were answered. The patient knows to call the clinic with any problems, questions or concerns.    Cammie Sickle, MD 04/01/2020 9:16 AM

## 2020-04-01 NOTE — Progress Notes (Signed)
Pt in for follow up, denies any concerns today.  Having drainage for left eye.  States taking allergy medications.

## 2020-04-05 ENCOUNTER — Ambulatory Visit (INDEPENDENT_AMBULATORY_CARE_PROVIDER_SITE_OTHER): Payer: Medicare HMO | Admitting: Internal Medicine

## 2020-04-05 ENCOUNTER — Encounter: Payer: Self-pay | Admitting: Internal Medicine

## 2020-04-05 ENCOUNTER — Other Ambulatory Visit: Payer: Self-pay

## 2020-04-05 VITALS — BP 144/45 | HR 64 | Ht 65.0 in | Wt 121.2 lb

## 2020-04-05 DIAGNOSIS — F419 Anxiety disorder, unspecified: Secondary | ICD-10-CM

## 2020-04-05 DIAGNOSIS — I10 Essential (primary) hypertension: Secondary | ICD-10-CM

## 2020-04-05 DIAGNOSIS — D631 Anemia in chronic kidney disease: Secondary | ICD-10-CM

## 2020-04-05 DIAGNOSIS — N1831 Chronic kidney disease, stage 3a: Secondary | ICD-10-CM

## 2020-04-05 DIAGNOSIS — C678 Malignant neoplasm of overlapping sites of bladder: Secondary | ICD-10-CM | POA: Diagnosis not present

## 2020-04-05 DIAGNOSIS — R69 Illness, unspecified: Secondary | ICD-10-CM | POA: Diagnosis not present

## 2020-04-05 DIAGNOSIS — H6123 Impacted cerumen, bilateral: Secondary | ICD-10-CM | POA: Diagnosis not present

## 2020-04-05 DIAGNOSIS — Z716 Tobacco abuse counseling: Secondary | ICD-10-CM | POA: Diagnosis not present

## 2020-04-05 NOTE — Assessment & Plan Note (Signed)
-   I instructed the patient to stop smoking and provided them with smoking cessation materials.  - I informed the patient that smoking puts them at increased risk for cancer, COPD, hypertension, and more.  - Informed the patient to seek help if they begin to have trouble breathing, develop chest pain, start to cough up blood, feel faint, or pass out.  

## 2020-04-05 NOTE — Assessment & Plan Note (Signed)
Both ears were washed and significant amount of wax was removed.  After ear was washed both ear was examined for any bleeding.  None was present.

## 2020-04-05 NOTE — Progress Notes (Signed)
Established Patient Office Visit  Subjective:  Patient ID: Edward Cummings, male    DOB: [birth year]  Age: 86 y.o. MRN: [id number]  CC:  Chief Complaint  Patient presents with  . whooshing in ears    neck pain and feels like there is a whooshing sound in ears, has been going on for a while but has worsened over the past few days    Ear Fullness  There is pain in both ears. This is a recurrent problem. The problem occurs every few minutes. There has been no fever. The pain is at a severity of 2/10. Associated symptoms include hearing loss and neck pain. Pertinent negatives include no abdominal pain, coughing, diarrhea, ear discharge, rash, rhinorrhea, sore throat or vomiting. He has tried nothing for the symptoms. The treatment provided moderate relief.    Edward Running Tuckerhas a high-grade carcinoma of the bladder with metastatic disease into the lymph node.  CT scan negative for recurrence, chronic thickening of the bladder wall noted.  Patient also has corneal dystrophy  Past Medical History:  Diagnosis Date  . Anemia   . Anxiety 12/31/2019  . Cancer ([place])    bladder  . Chronic kidney disease   . Depression   . Hypertension   . Neuromuscular disorder ([place])    Nerve damage to left face/eye since around 2002.    Past Surgical History:  Procedure Laterality Date  . CYSTOSCOPY W/ RETROGRADES Bilateral 01/25/2018   Procedure: CYSTOSCOPY WITH RETROGRADE PYELOGRAM;  [name], MD;  [name];  Service: Urology;  Laterality: Bilateral;  . CYSTOSCOPY W/ URETERAL STENT PLACEMENT Bilateral 01/06/2019   Procedure: CYSTOSCOPY WITH RETROGRADE PYELOGRAM/URETERAL STENT REMOVAL;  [name], MD;  [name];  Service: Urology;  Laterality: Bilateral;  . CYSTOSCOPY WITH STENT PLACEMENT Bilateral 01/25/2018   Procedure: CYSTOSCOPY WITH STENT PLACEMENT;  [name], MD;  [name];  Service: Urology;  Laterality: Bilateral;  .  DORSAL SLIT N/A 01/06/2019   Procedure: DORSAL SLIT;  [name], MD;  [name];  Service: Urology;  [name];  . ESOPHAGOGASTRODUODENOSCOPY (EGD) WITH PROPOFOL N/A 11/12/2019   Procedure: ESOPHAGOGASTRODUODENOSCOPY (EGD) WITH PROPOFOL;  Surgeon: Virgel Manifold, MD;  Location: [facility];  Service: Endoscopy;  [name];  . EYE SURGERY     Cornea transplants bilaterally & cataract surgery.  . TRANSURETHRAL RESECTION OF BLADDER TUMOR N/A 01/25/2018   Procedure: TRANSURETHRAL RESECTION OF BLADDER TUMOR (TURBT);  [name], MD;  [name];  Service: Urology;  [name];    Family History  Problem Relation Age of Onset  . Prostate cancer Neg Hx   . Kidney cancer Neg Hx   . Bladder Cancer Neg Hx     Social History   Socioeconomic History  . Marital status: Married    Spouse name: [name]  . Number of children: Not on file  . Years of education: Not on file  . Highest education level: Not on file  Occupational History  . Not on file  Tobacco Use  . Smoking status: Former Smoker    Types: Cigarettes  . Smokeless tobacco: Current User    Types: Chew  . Tobacco comment: Stopped approximately 10 years ago.  Vaping Use  . Vaping Use: Never used  Substance and Sexual Activity  . Alcohol use: Not Currently    Alcohol/week: 2.0 standard drinks    Types: 2 Cans of beer per week  Comment: Daily  . Drug use: Never  . Sexual activity: Yes    Birth control/protection: None  Other Topics Concern  . Not on file  Social History Narrative  . Not on file   Social Determinants of Health   Financial Resource Strain:   . Difficulty of Paying Living Expenses: Not on file  Food Insecurity:   . Worried About Charity fundraiser in the Last Year: Not on file  . Ran Out of Food in the Last Year: Not on file  Transportation Needs:   . Lack of Transportation (Medical): Not on file  . Lack of Transportation  (Non-Medical): Not on file  Physical Activity:   . Days of Exercise per Week: Not on file  . Minutes of Exercise per Session: Not on file  Stress:   . Feeling of Stress : Not on file  Social Connections:   . Frequency of Communication with Friends and Family: Not on file  . Frequency of Social Gatherings with Friends and Family: Not on file  . Attends Religious Services: Not on file  . Active Member of Clubs or Organizations: Not on file  . Attends Archivist Meetings: Not on file  . Marital Status: Not on file  Intimate Partner Violence:   . Fear of Current or Ex-Partner: Not on file  . Emotionally Abused: Not on file  . Physically Abused: Not on file  . Sexually Abused: Not on file     Current Outpatient Medications:  .  amLODipine (NORVASC) 5 MG tablet, TAKE 1 TABLET BY MOUTH ONCE A DAY, Disp: 90 tablet, Rfl: 3 .  aspirin EC 81 MG tablet, Take 81 mg by mouth daily. , Disp: , Rfl:  .  docusate sodium (COLACE) 50 MG capsule, Take 50 mg by mouth at bedtime., Disp: , Rfl:  .  fexofenadine (ALLEGRA) 180 MG tablet, Take 180 mg by mouth daily. , Disp: , Rfl:  .  fluticasone (FLONASE) 50 MCG/ACT nasal spray, Place 2 sprays into both nostrils daily as needed for allergies or rhinitis., Disp: 11.1 mL, Rfl: 6 .  HYDROcodone-acetaminophen (NORCO/VICODIN) 5-325 MG tablet, Take 1 tablet by mouth every 8 (eight) hours as needed for moderate pain. (Patient not taking: Reported on 04/01/2020), Disp: 30 tablet, Rfl: 0 .  levothyroxine (SYNTHROID) 88 MCG tablet, Take 1 tablet (88 mcg total) by mouth daily before breakfast. Empty stomach- 1 hour prior to breakfast., Disp: 90 tablet, Rfl: 3 .  loratadine (CLARITIN) 10 MG tablet, Take 1 tablet (10 mg total) by mouth daily., Disp: 30 tablet, Rfl: 11 .  Multiple Vitamin (MULTIVITAMIN WITH MINERALS) TABS tablet, Take 1 tablet by mouth daily. , Disp: , Rfl:  .  oxybutynin (DITROPAN) 5 MG tablet, Take 1 tablet by mouth 3 (three) times daily as  needed. (Patient not taking: Reported on 04/01/2020), Disp: , Rfl:  .  prednisoLONE acetate (PRED FORTE) 1 % ophthalmic suspension, Place 1 drop into both eyes daily., Disp: , Rfl:  .  tamsulosin (FLOMAX) 0.4 MG CAPS capsule, Take 1 capsule (0.4 mg total) by mouth at bedtime. (Patient not taking: Reported on 04/01/2020), Disp: 90 capsule, Rfl: 1   No Known Allergies  ROS Review of Systems  HENT: Positive for hearing loss. Negative for ear discharge, rhinorrhea and sore throat.   Respiratory: Negative for cough.   Gastrointestinal: Negative for abdominal pain, diarrhea and vomiting.  Musculoskeletal: Positive for neck pain.  Skin: Negative for rash.      Objective:  Physical Exam Vitals reviewed.  Constitutional:      Appearance: Normal appearance.  HENT:     Ears:     Comments: Both ear canals were filled with wax.  Left more than the right.    Mouth/Throat:     Mouth: Mucous membranes are moist.  Eyes:     Pupils: Pupils are equal, round, and reactive to light.  Neck:     Vascular: No carotid bruit.  Cardiovascular:     Rate and Rhythm: Normal rate and regular rhythm.     Pulses: Normal pulses.     Heart sounds: Normal heart sounds.  Pulmonary:     Effort: Pulmonary effort is normal.     Breath sounds: Normal breath sounds.  Abdominal:     General: Bowel sounds are normal.     Palpations: Abdomen is soft. There is no hepatomegaly, splenomegaly or mass.     Tenderness: There is no abdominal tenderness.     Hernia: No hernia is present.  Musculoskeletal:     Cervical back: Neck supple.     Right lower leg: No edema.     Left lower leg: No edema.  Skin:    Findings: No rash.  Neurological:     Mental Status: He is alert and oriented to person, place, and time.     Motor: No weakness.  Psychiatric:        Mood and Affect: Mood normal.        Behavior: Behavior normal.     BP (!) 144/45   Pulse 64   Ht 5\' 5"  (1.651 m)   Wt 121 lb 3.2 oz (55 kg)   BMI 20.17  kg/m  Wt Readings from Last 3 Encounters:  04/05/20 121 lb 3.2 oz (55 kg)  04/01/20 120 lb (54.4 kg)  03/18/20 120 lb 12.8 oz (54.8 kg)     Health Maintenance Due  Topic Date Due  . COVID-19 Vaccine (1) Never done  . TETANUS/TDAP  Never done  . PNA vac Low Risk Adult (2 of 2 - PCV13) 04/09/2019  . INFLUENZA VACCINE  Never done    There are no preventive care reminders to display for this patient.  Lab Results  Component Value Date   TSH 0.489 02/05/2020   Lab Results  Component Value Date   WBC 7.1 04/01/2020   HGB 8.9 (L) 04/01/2020   HCT 26.7 (L) 04/01/2020   MCV 98.5 04/01/2020   PLT 222 04/01/2020   Lab Results  Component Value Date   NA 137 04/01/2020   K 4.3 04/01/2020   CO2 23 04/01/2020   GLUCOSE 126 (H) 04/01/2020   BUN 34 (H) 04/01/2020   CREATININE 2.11 (H) 04/01/2020   BILITOT 0.5 04/01/2020   ALKPHOS 69 04/01/2020   AST 20 04/01/2020   ALT 13 04/01/2020   PROT 6.9 04/01/2020   ALBUMIN 3.8 04/01/2020   CALCIUM 9.0 04/01/2020   ANIONGAP 7 04/01/2020   No results found for: CHOL No results found for: HDL No results found for: LDLCALC No results found for: TRIG No results found for: CHOLHDL Lab Results  Component Value Date   HGBA1C 4.5 (L) 04/13/2018      Assessment & Plan:   Problem List Items Addressed This Visit      Cardiovascular and Mediastinum   Hypertension - Primary    Blood pressure stable on today's examination.  Patient was advised to continue his present antihypertensive medication        Nervous  and Auditory   Cerumen debris on tympanic membrane of both ears    Both ears were washed and significant amount of wax was removed.  After ear was washed both ear was examined for any bleeding.  None was present.        Genitourinary   Cancer of overlapping sites of bladder [facility])    Patient is under the care of a oncologist.      Anemia due to stage 3 chronic kidney disease [facility])    Patient is being followed by  oncologist.        Other   Anxiety    - Patient experiencing high levels of anxiety.  - Encouraged patient to engage in relaxing activities like yoga, meditation, journaling, going for a walk, or participating in a hobby.  - Encouraged patient to reach out to trusted friends or family members about recent struggles       Tobacco abuse counseling    - I instructed the patient to stop smoking and provided them with smoking cessation materials.  - I informed the patient that smoking puts them at increased risk for cancer, COPD, hypertension, and more.  - Informed the patient to seek help if they begin to have trouble breathing, develop chest pain, start to cough up blood, feel faint, or pass out.           No orders of the defined types were placed in this encounter. Ceruminosis is noted.  Wax is removed by syringing and manual debridement. Instructions for home care to prevent wax buildup are given. Follow-up: No follow-ups on file.    [name], MD

## 2020-04-05 NOTE — Assessment & Plan Note (Signed)
Patient is under the care of a oncologist.

## 2020-04-05 NOTE — Assessment & Plan Note (Signed)
Patient is being followed by oncologist.

## 2020-04-05 NOTE — Assessment & Plan Note (Signed)
-   Patient experiencing high levels of anxiety.  - Encouraged patient to engage in relaxing activities like yoga, meditation, journaling, going for a walk, or participating in a hobby.  - Encouraged patient to reach out to trusted friends or family members about recent struggles 

## 2020-04-05 NOTE — Assessment & Plan Note (Signed)
Blood pressure stable on today's examination.  Patient was advised to continue his present antihypertensive medication

## 2020-04-12 ENCOUNTER — Ambulatory Visit
Admission: RE | Admit: 2020-04-12 | Discharge: 2020-04-12 | Disposition: A | Discharge status: Home / Self Care | Payer: Medicare HMO | Source: Ambulatory Visit | Attending: Internal Medicine | Admitting: Internal Medicine

## 2020-04-12 ENCOUNTER — Other Ambulatory Visit: Payer: Self-pay

## 2020-04-12 DIAGNOSIS — I2510 Atherosclerotic heart disease of native coronary artery without angina pectoris: Secondary | ICD-10-CM | POA: Diagnosis not present

## 2020-04-12 DIAGNOSIS — K820 Obstruction of gallbladder: Secondary | ICD-10-CM | POA: Diagnosis not present

## 2020-04-12 DIAGNOSIS — M545 Low back pain, unspecified: Secondary | ICD-10-CM | POA: Diagnosis not present

## 2020-04-12 DIAGNOSIS — C678 Malignant neoplasm of overlapping sites of bladder: Secondary | ICD-10-CM | POA: Diagnosis not present

## 2020-04-12 DIAGNOSIS — C679 Malignant neoplasm of bladder, unspecified: Secondary | ICD-10-CM | POA: Diagnosis not present

## 2020-04-12 DIAGNOSIS — J432 Centrilobular emphysema: Secondary | ICD-10-CM | POA: Diagnosis not present

## 2020-04-12 DIAGNOSIS — M47816 Spondylosis without myelopathy or radiculopathy, lumbar region: Secondary | ICD-10-CM | POA: Diagnosis not present

## 2020-04-12 DIAGNOSIS — N289 Disorder of kidney and ureter, unspecified: Secondary | ICD-10-CM | POA: Diagnosis not present

## 2020-04-12 DIAGNOSIS — J984 Other disorders of lung: Secondary | ICD-10-CM | POA: Diagnosis not present

## 2020-04-12 IMAGING — CT CT CHEST W/O CM
2 of 4 series · 12 of 36 positions shown, 15 images · non-contrast
Comparison: [DATE]

CLINICAL DATA: Restaging bladder cancer. Low back pain for the last
several days.

EXAM:
CT CHEST, ABDOMEN AND PELVIS WITHOUT CONTRAST
TECHNIQUE: Multidetector CT imaging of the chest, abdomen and pelvis was
performed following the standard protocol without IV contrast.

[Series 2: axials cap 5.00 · axial · 0.66mm/px · z∈[-1518,-948]mm · 9 of 138 slices shown, 12 images]
[im 12/138  mediastinal]
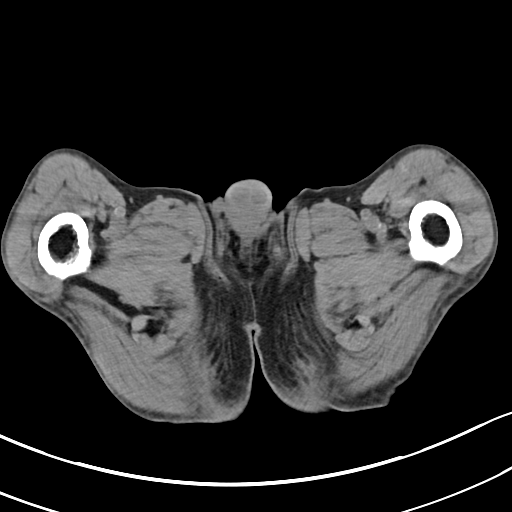
[im 12/138  lung]
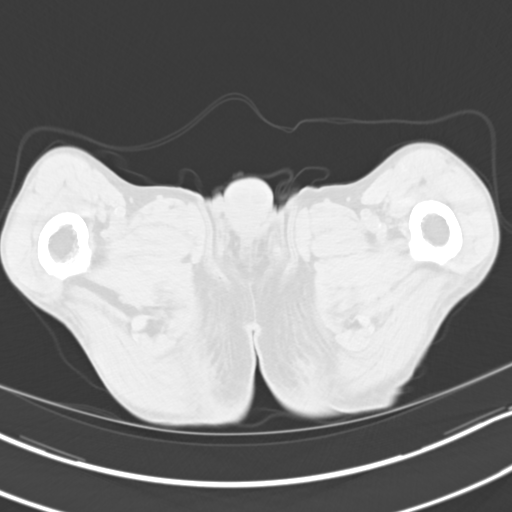
[im 23/138  lung]
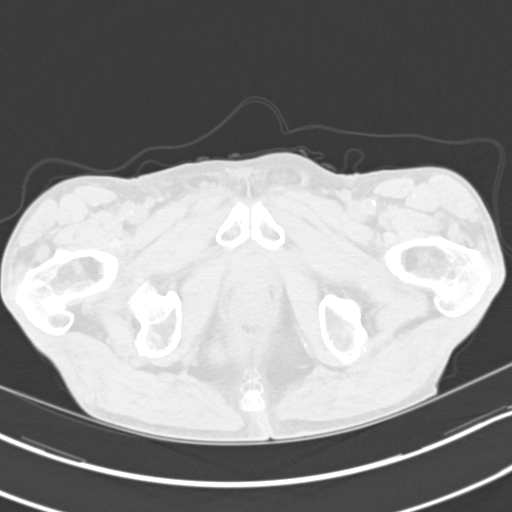
[im 46/138  lung]
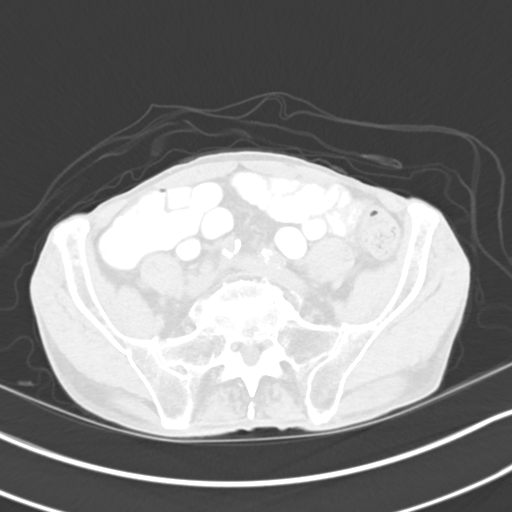
[im 58/138  lung]
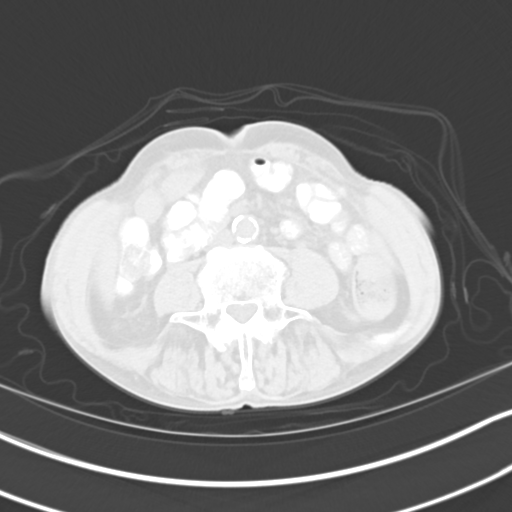
[im 69/138  mediastinal]
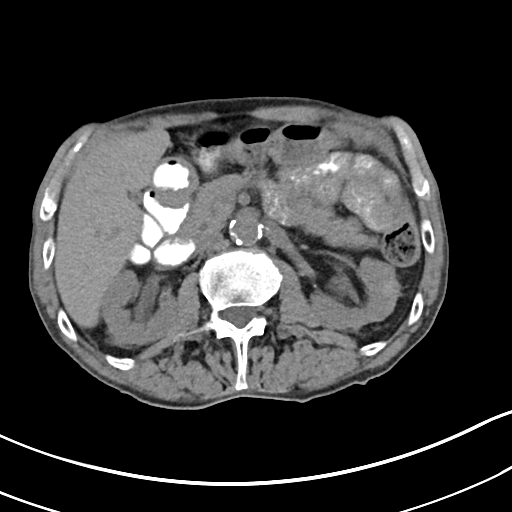
[im 69/138  lung]
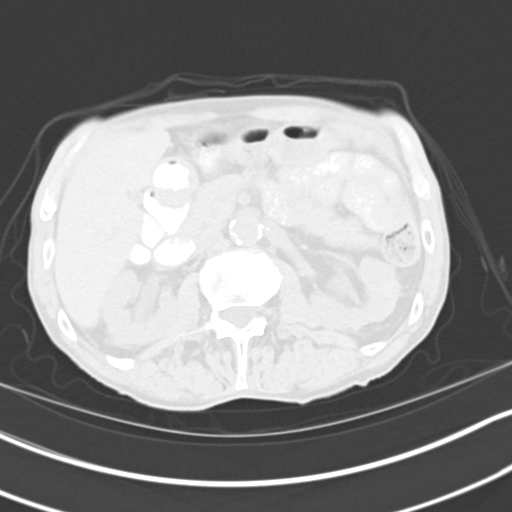
[im 80/138  lung]
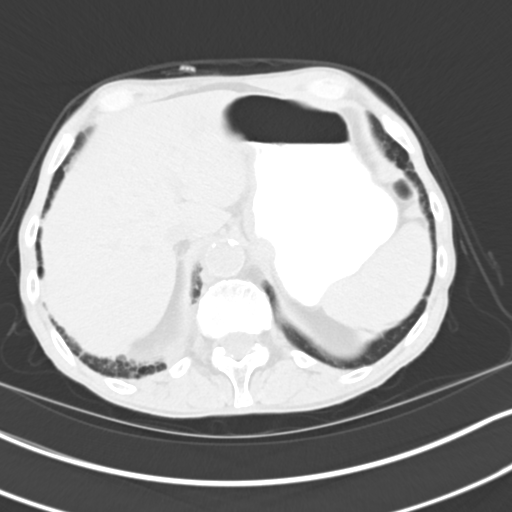
[im 92/138  lung]
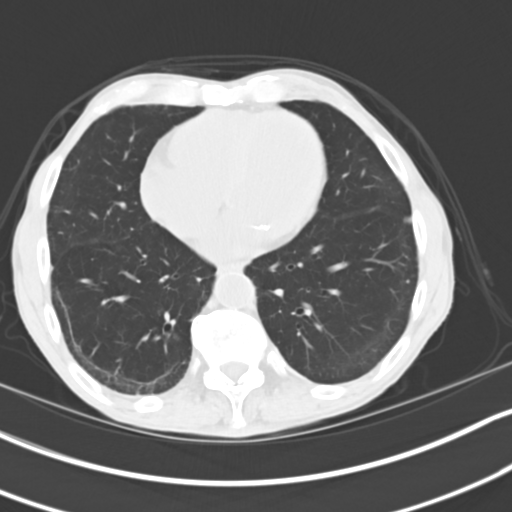
[im 115/138  lung]
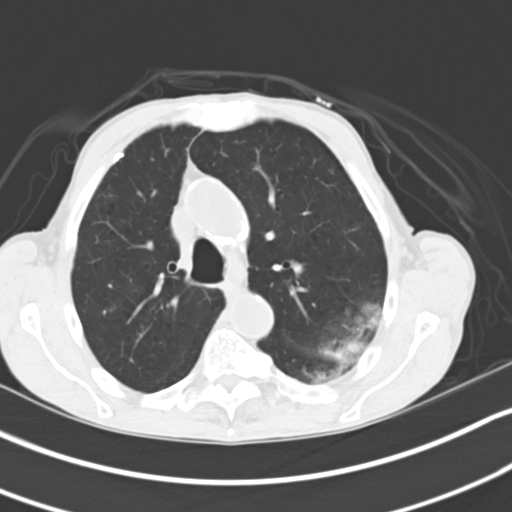
[im 126/138  mediastinal]
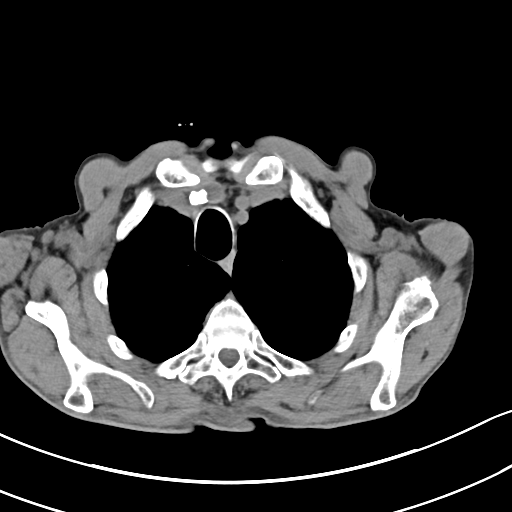
[im 126/138  lung]
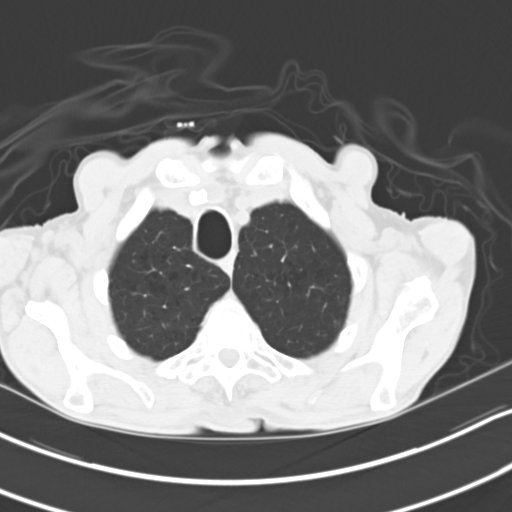

[Series 4: coronals cap 2.00 cor · coronal · 0.68mm/px · 3 of 130 slices shown]
[im 26/130  lung]
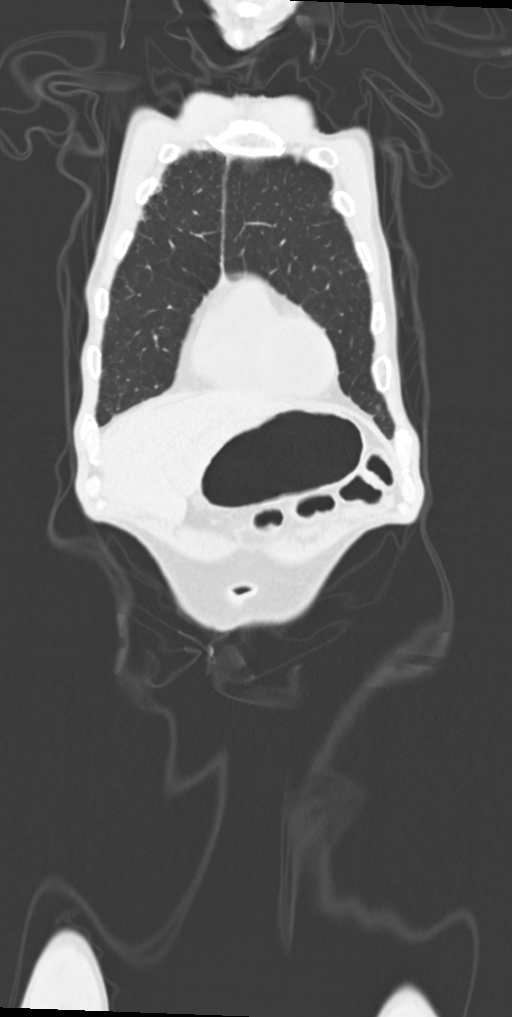
[im 52/130  lung]
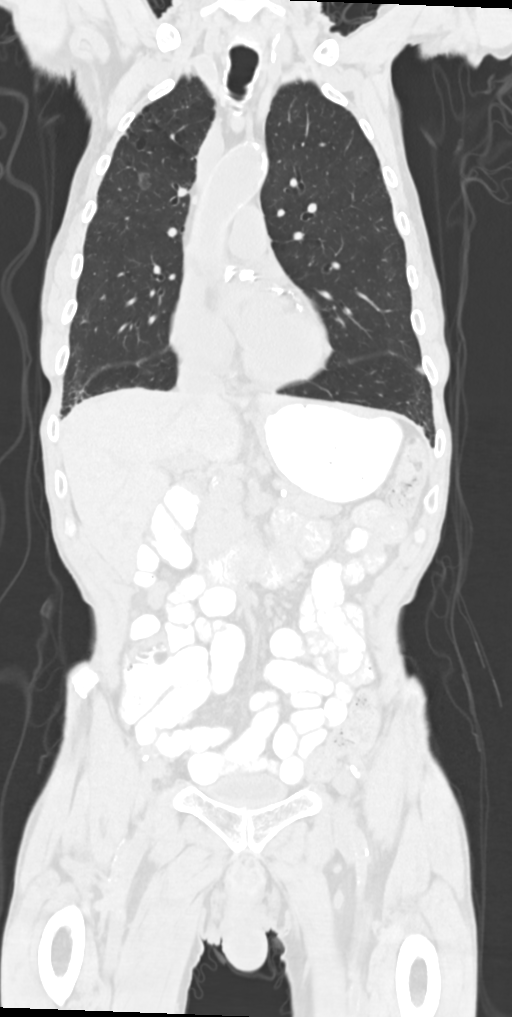
[im 78/130  lung]
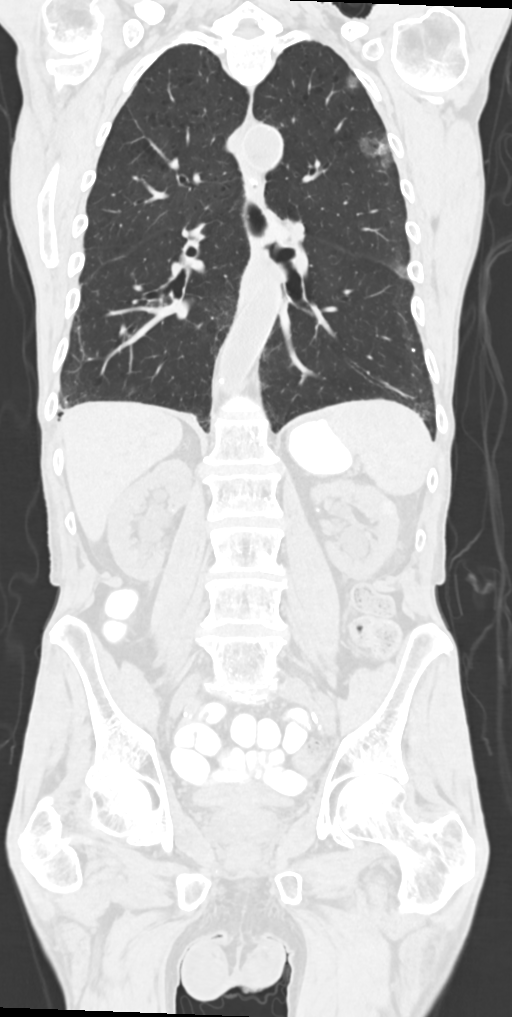

[12 of 36 positions shown; findings below may reference images not displayed]

FINDINGS: CT CHEST FINDINGS

Cardiovascular: Coronary, aortic arch, and branch vessel
atherosclerotic vascular disease. Upper normal sized ascending
thoracic aorta. Low-density blood pool suggests anemia.

Mediastinum/Nodes: Contrast medium in the esophagus suggesting
dysmotility or reflux. Small mediastinal lymph nodes are not
pathologically enlarged.

Lungs/Pleura: Chronic appearing bilateral pleural calcifications
likely from remote asbestos exposure. Biapical pleuroparenchymal
scarring. Centrilobular emphysema. Subpleural reticulation favoring
the lung bases.

Scattered new ground-glass opacities in both lungs primarily
conforming to secondary pulmonary lobular configurations but with
greater confluence posteriorly in the left upper lobe and
posteriorly in the superior segment left lower lobe. Given that
these are new from previous, there most likely a manifestation of
atypical infectious process. Stable 0.5 cm sub solid nodule along
the left hemidiaphragm on image 134/3. Chronic 2 mm right upper lobe
pulmonary nodule on image 64/3, stable.

Musculoskeletal: Thoracic spondylosis.

CT ABDOMEN PELVIS FINDINGS

Hepatobiliary: Contracted gallbladder.  Otherwise unremarkable.

Pancreas: Unremarkable

Spleen: Unremarkable

Adrenals/Urinary Tract: Right-sided hypodense renal lesions,
probably cysts. 1.2 by 1.1 cm hyperdense left kidney upper pole
lesion on 68/2, probably a complex cyst but technically nonspecific
on today's noncontrast CT. Without overt hydronephrosis stable mild
fullness of both collecting systems, an without hydroureter.

Stomach/Bowel: Unremarkable

Vascular/Lymphatic: Aortoiliac atherosclerotic vascular disease. No
pathologic adenopathy identified.

Reproductive: Mild prostatomegaly.

Other: No supplemental non-categorized findings.

Musculoskeletal: Lower lumbar spondylosis and degenerative disc
disease.
IMPRESSION: 1. No findings of recurrent malignancy.
2. Scattered new ground-glass opacities in both lungs primarily
conforming to secondary pulmonary lobular configurations but with
greater confluence posteriorly in the left upper lobe and superior
segment left lower lobe. Given that these are new from previous,
there most likely a manifestation of atypical infectious process.
Consider follow up CT chest in 3 months time to assess for
stability.
3. Other imaging findings of potential clinical significance:
Coronary atherosclerosis. Low-density blood pool suggests anemia.
Chronic appearing bilateral pleural calcifications likely from
remote asbestos exposure. Right-sided hypodense renal lesions,
probably cysts. Mild prostatomegaly. Lower lumbar spondylosis and
degenerative disc disease. Chronically stable 2 mm right lung nodule
and chronically stable 5 mm sub solid nodule in the left lower lobe.
4. Emphysema and aortic atherosclerosis.

Aortic Atherosclerosis ([7U]-[7U]) and Emphysema ([7U]-[7U]).

## 2020-04-12 IMAGING — CT CT ABD-PELV W/O CM
2 of 4 series · 13 of 46 positions shown, 15 images · non-contrast
Comparison: [DATE]

CLINICAL DATA: Restaging bladder cancer. Low back pain for the last
several days.

EXAM:
CT CHEST, ABDOMEN AND PELVIS WITHOUT CONTRAST
TECHNIQUE: Multidetector CT imaging of the chest, abdomen and pelvis was
performed following the standard protocol without IV contrast.

[Series 2: axials cap 5.00 · axial · 0.66mm/px · z∈[-1528,-938]mm · 10 of 138 slices shown, 12 images]
[im 10/138  soft-tissue]
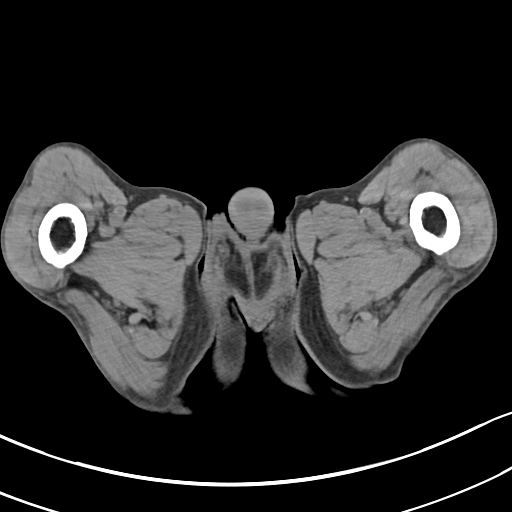
[im 10/138  bone]
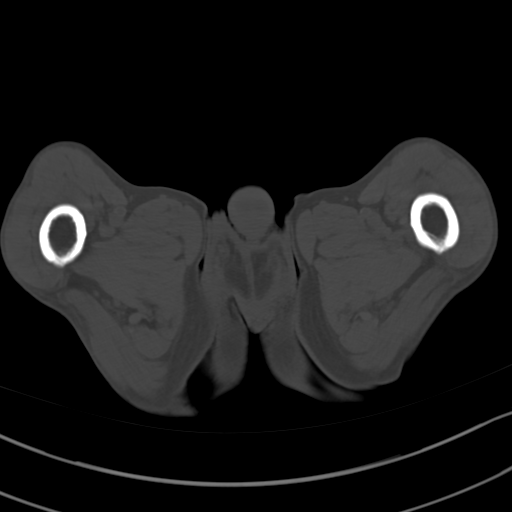
[im 20/138  soft-tissue]
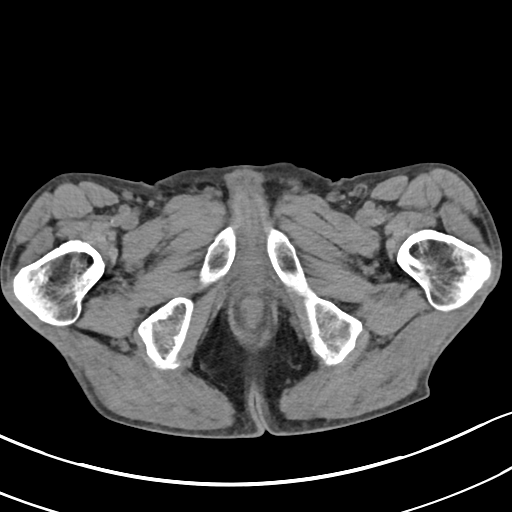
[im 40/138  soft-tissue]
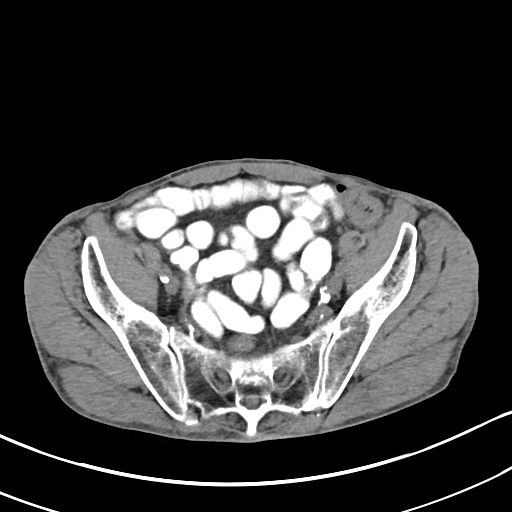
[im 49/138  soft-tissue]
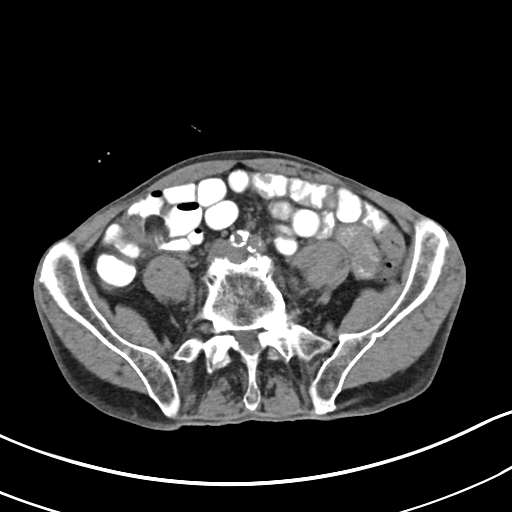
[im 59/138  soft-tissue]
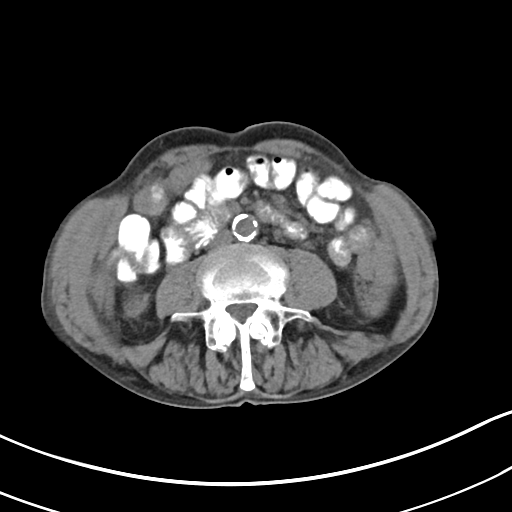
[im 79/138  soft-tissue]
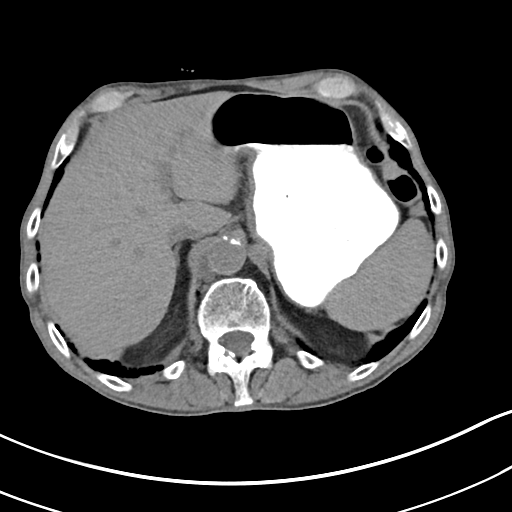
[im 89/138  soft-tissue]
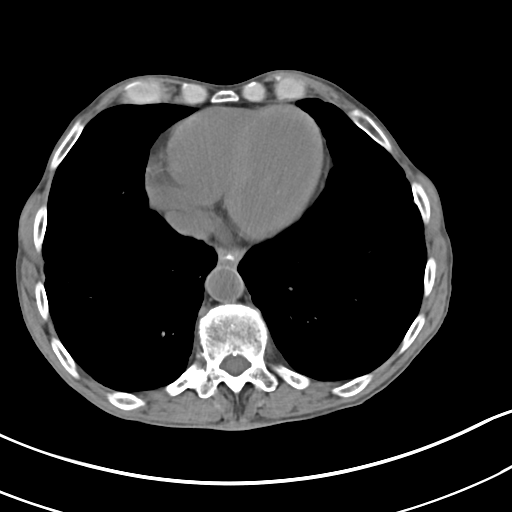
[im 98/138  soft-tissue]
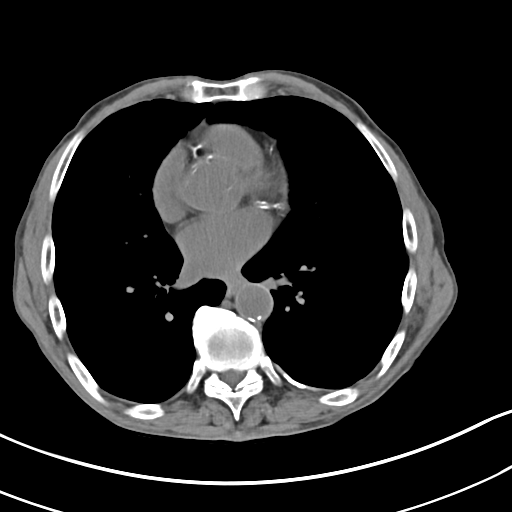
[im 118/138  soft-tissue]
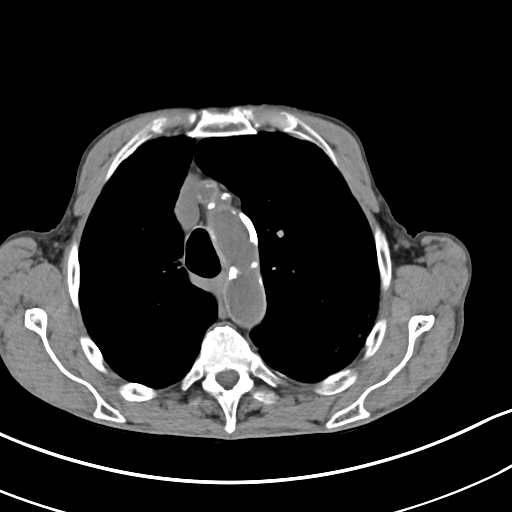
[im 118/138  bone]
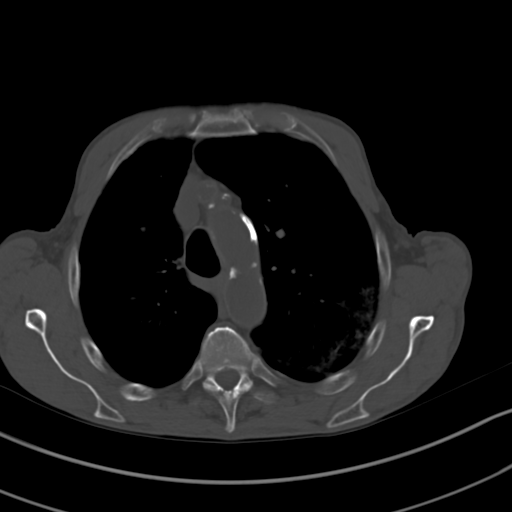
[im 128/138  soft-tissue]
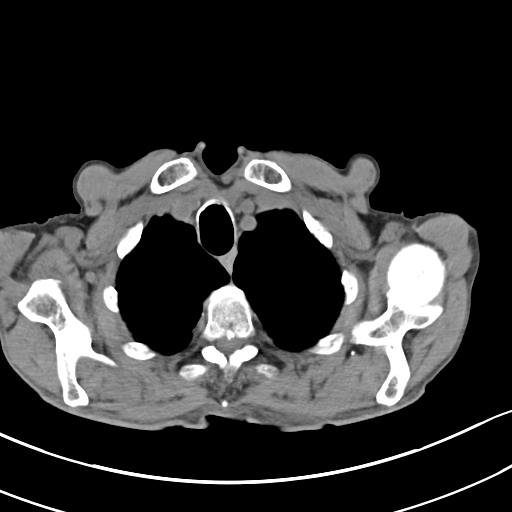

[Series 4: coronals cap 2.00 cor · coronal · 0.68mm/px · 3 of 130 slices shown]
[im 44/130  soft-tissue]
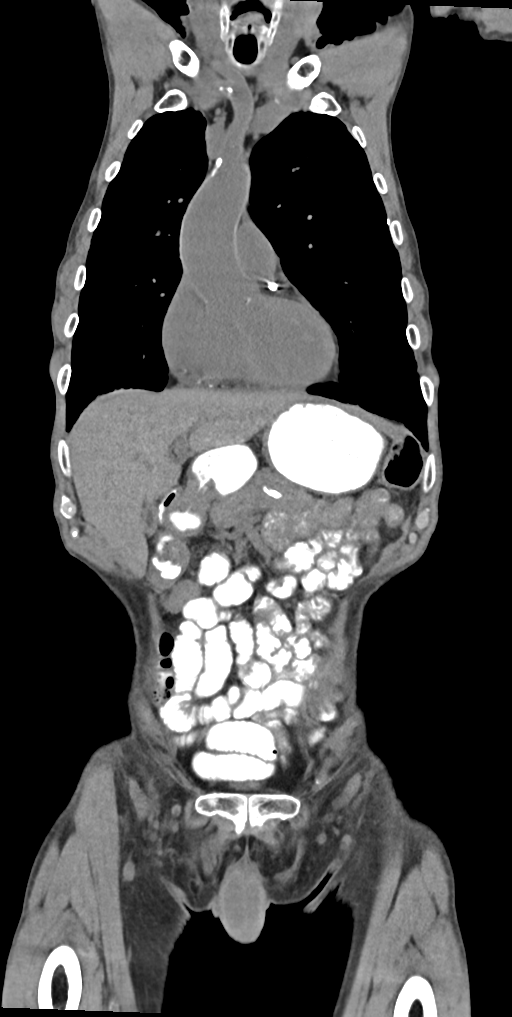
[im 58/130  soft-tissue]
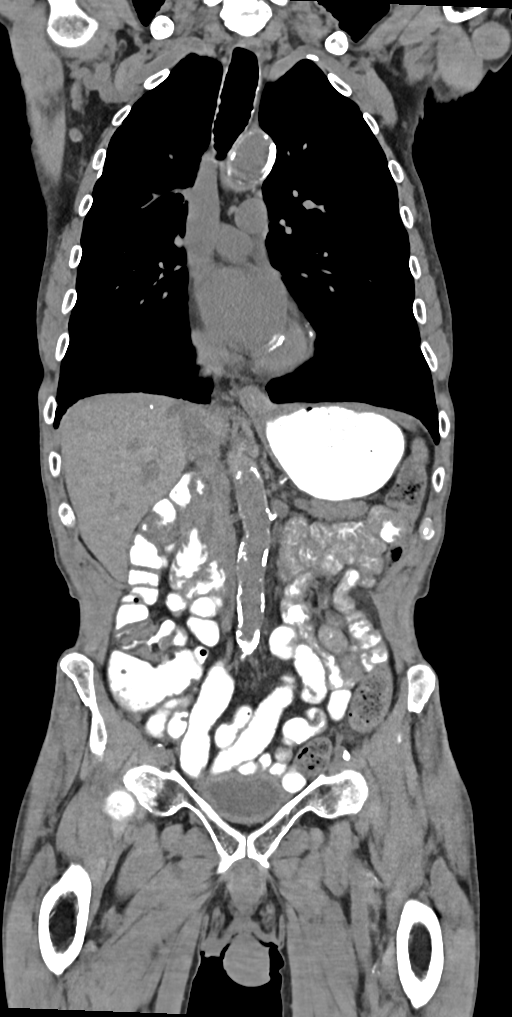
[im 72/130  soft-tissue]
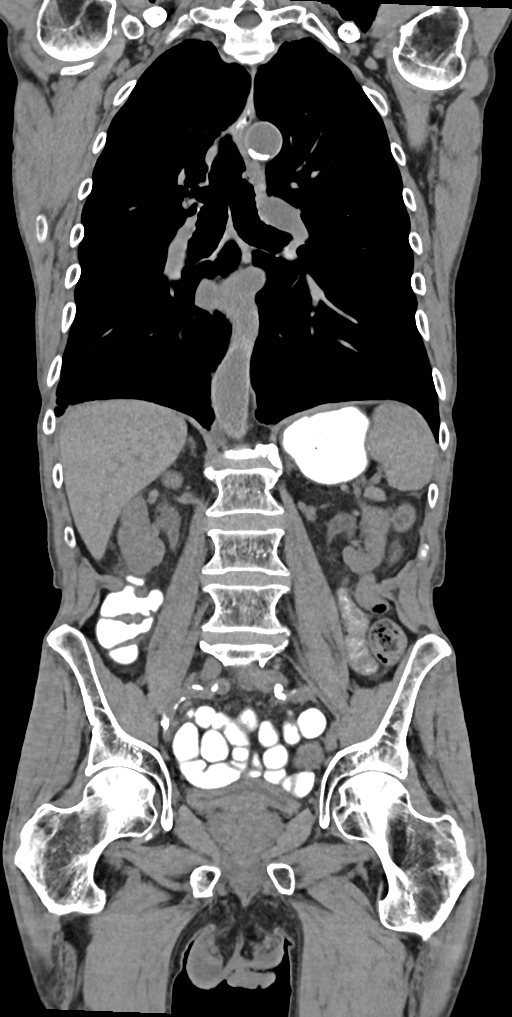

[13 of 46 positions shown; findings below may reference images not displayed]

FINDINGS: CT CHEST FINDINGS

Cardiovascular: Coronary, aortic arch, and branch vessel
atherosclerotic vascular disease. Upper normal sized ascending
thoracic aorta. Low-density blood pool suggests anemia.

Mediastinum/Nodes: Contrast medium in the esophagus suggesting
dysmotility or reflux. Small mediastinal lymph nodes are not
pathologically enlarged.

Lungs/Pleura: Chronic appearing bilateral pleural calcifications
likely from remote asbestos exposure. Biapical pleuroparenchymal
scarring. Centrilobular emphysema. Subpleural reticulation favoring
the lung bases.

Scattered new ground-glass opacities in both lungs primarily
conforming to secondary pulmonary lobular configurations but with
greater confluence posteriorly in the left upper lobe and
posteriorly in the superior segment left lower lobe. Given that
these are new from previous, there most likely a manifestation of
atypical infectious process. Stable 0.5 cm sub solid nodule along
the left hemidiaphragm on image 134/3. Chronic 2 mm right upper lobe
pulmonary nodule on image 64/3, stable.

Musculoskeletal: Thoracic spondylosis.

CT ABDOMEN PELVIS FINDINGS

Hepatobiliary: Contracted gallbladder.  Otherwise unremarkable.

Pancreas: Unremarkable

Spleen: Unremarkable

Adrenals/Urinary Tract: Right-sided hypodense renal lesions,
probably cysts. 1.2 by 1.1 cm hyperdense left kidney upper pole
lesion on 68/2, probably a complex cyst but technically nonspecific
on today's noncontrast CT. Without overt hydronephrosis stable mild
fullness of both collecting systems, an without hydroureter.

Stomach/Bowel: Unremarkable

Vascular/Lymphatic: Aortoiliac atherosclerotic vascular disease. No
pathologic adenopathy identified.

Reproductive: Mild prostatomegaly.

Other: No supplemental non-categorized findings.

Musculoskeletal: Lower lumbar spondylosis and degenerative disc
disease.
IMPRESSION: 1. No findings of recurrent malignancy.
2. Scattered new ground-glass opacities in both lungs primarily
conforming to secondary pulmonary lobular configurations but with
greater confluence posteriorly in the left upper lobe and superior
segment left lower lobe. Given that these are new from previous,
there most likely a manifestation of atypical infectious process.
Consider follow up CT chest in 3 months time to assess for
stability.
3. Other imaging findings of potential clinical significance:
Coronary atherosclerosis. Low-density blood pool suggests anemia.
Chronic appearing bilateral pleural calcifications likely from
remote asbestos exposure. Right-sided hypodense renal lesions,
probably cysts. Mild prostatomegaly. Lower lumbar spondylosis and
degenerative disc disease. Chronically stable 2 mm right lung nodule
and chronically stable 5 mm sub solid nodule in the left lower lobe.
4. Emphysema and aortic atherosclerosis.

Aortic Atherosclerosis ([7U]-[7U]) and Emphysema ([7U]-[7U]).

## 2020-04-14 ENCOUNTER — Telehealth: Payer: Self-pay | Admitting: *Deleted

## 2020-04-14 NOTE — Telephone Encounter (Signed)
Patient's son Legrand Como arrived to cancer center and requested to speak to Nira Conn, RN regarding his dad's care.  Per Legrand Como - patient has reported new onset of neck pain and headaches in back of head x 2 weeks. Son would like Dr. B to evaluate this concern at the next visit. Per son, patient has not reported any new visual disturbances.   Patient has an apt on 11/4 with Dr. Jacinto Reap.

## 2020-04-15 ENCOUNTER — Inpatient Hospital Stay: Payer: Medicare HMO

## 2020-04-15 ENCOUNTER — Inpatient Hospital Stay: Payer: Medicare HMO | Attending: Internal Medicine

## 2020-04-15 ENCOUNTER — Inpatient Hospital Stay (HOSPITAL_BASED_OUTPATIENT_CLINIC_OR_DEPARTMENT_OTHER): Payer: Medicare HMO | Admitting: Internal Medicine

## 2020-04-15 ENCOUNTER — Other Ambulatory Visit: Payer: Self-pay

## 2020-04-15 ENCOUNTER — Encounter: Payer: Self-pay | Admitting: Internal Medicine

## 2020-04-15 VITALS — BP 122/49 | HR 56 | Resp 16

## 2020-04-15 VITALS — BP 137/46 | HR 52 | Temp 98.7°F | Resp 16 | Ht 65.0 in | Wt 121.8 lb

## 2020-04-15 DIAGNOSIS — C678 Malignant neoplasm of overlapping sites of bladder: Secondary | ICD-10-CM | POA: Insufficient documentation

## 2020-04-15 DIAGNOSIS — N1831 Chronic kidney disease, stage 3a: Secondary | ICD-10-CM

## 2020-04-15 DIAGNOSIS — D631 Anemia in chronic kidney disease: Secondary | ICD-10-CM

## 2020-04-15 DIAGNOSIS — Z23 Encounter for immunization: Secondary | ICD-10-CM | POA: Insufficient documentation

## 2020-04-15 DIAGNOSIS — N400 Enlarged prostate without lower urinary tract symptoms: Secondary | ICD-10-CM | POA: Diagnosis not present

## 2020-04-15 DIAGNOSIS — C772 Secondary and unspecified malignant neoplasm of intra-abdominal lymph nodes: Secondary | ICD-10-CM | POA: Insufficient documentation

## 2020-04-15 DIAGNOSIS — Z5112 Encounter for antineoplastic immunotherapy: Secondary | ICD-10-CM | POA: Insufficient documentation

## 2020-04-15 DIAGNOSIS — Z7952 Long term (current) use of systemic steroids: Secondary | ICD-10-CM | POA: Insufficient documentation

## 2020-04-15 DIAGNOSIS — Z87891 Personal history of nicotine dependence: Secondary | ICD-10-CM | POA: Insufficient documentation

## 2020-04-15 DIAGNOSIS — Z7982 Long term (current) use of aspirin: Secondary | ICD-10-CM | POA: Insufficient documentation

## 2020-04-15 DIAGNOSIS — J439 Emphysema, unspecified: Secondary | ICD-10-CM | POA: Diagnosis not present

## 2020-04-15 DIAGNOSIS — Z79899 Other long term (current) drug therapy: Secondary | ICD-10-CM | POA: Diagnosis not present

## 2020-04-15 DIAGNOSIS — N184 Chronic kidney disease, stage 4 (severe): Secondary | ICD-10-CM | POA: Insufficient documentation

## 2020-04-15 DIAGNOSIS — I10 Essential (primary) hypertension: Secondary | ICD-10-CM | POA: Diagnosis not present

## 2020-04-15 LAB — CBC WITH DIFFERENTIAL/PLATELET
Abs Immature Granulocytes: 0.02 10*3/uL (ref 0.00–0.07)
Basophils Absolute: 0.1 10*3/uL (ref 0.0–0.1)
Basophils Relative: 1 %
Eosinophils Absolute: 1.2 10*3/uL — ABNORMAL HIGH (ref 0.0–0.5)
Eosinophils Relative: 14 %
HCT: 23.9 % — ABNORMAL LOW (ref 39.0–52.0)
Hemoglobin: 8 g/dL — ABNORMAL LOW (ref 13.0–17.0)
Immature Granulocytes: 0 %
Lymphocytes Relative: 24 %
Lymphs Abs: 2 10*3/uL (ref 0.7–4.0)
MCH: 32.9 pg (ref 26.0–34.0)
MCHC: 33.5 g/dL (ref 30.0–36.0)
MCV: 98.4 fL (ref 80.0–100.0)
Monocytes Absolute: 0.8 10*3/uL (ref 0.1–1.0)
Monocytes Relative: 9 %
Neutro Abs: 4.3 10*3/uL (ref 1.7–7.7)
Neutrophils Relative %: 52 %
Platelets: 220 10*3/uL (ref 150–400)
RBC: 2.43 MIL/uL — ABNORMAL LOW (ref 4.22–5.81)
RDW: 12.3 % (ref 11.5–15.5)
WBC: 8.4 10*3/uL (ref 4.0–10.5)
nRBC: 0 % (ref 0.0–0.2)

## 2020-04-15 LAB — COMPREHENSIVE METABOLIC PANEL
ALT: 12 U/L (ref 0–44)
AST: 17 U/L (ref 15–41)
Albumin: 3.5 g/dL (ref 3.5–5.0)
Alkaline Phosphatase: 72 U/L (ref 38–126)
Anion gap: 5 (ref 5–15)
BUN: 33 mg/dL — ABNORMAL HIGH (ref 8–23)
CO2: 23 mmol/L (ref 22–32)
Calcium: 8.9 mg/dL (ref 8.9–10.3)
Chloride: 107 mmol/L (ref 98–111)
Creatinine, Ser: 1.95 mg/dL — ABNORMAL HIGH (ref 0.61–1.24)
GFR, Estimated: 33 mL/min — ABNORMAL LOW (ref >60)
Glucose, Bld: 90 mg/dL (ref 70–99)
Potassium: 4.8 mmol/L (ref 3.5–5.1)
Sodium: 135 mmol/L (ref 135–145)
Total Bilirubin: 0.8 mg/dL (ref 0.3–1.2)
Total Protein: 6.9 g/dL (ref 6.5–8.1)

## 2020-04-15 LAB — FERRITIN: Ferritin: 465 ng/mL — ABNORMAL HIGH (ref 24–336)

## 2020-04-15 LAB — IRON AND TIBC
Iron: 21 ug/dL — ABNORMAL LOW (ref 45–182)
Saturation Ratios: 11 % — ABNORMAL LOW (ref 17.9–39.5)
TIBC: 186 ug/dL — ABNORMAL LOW (ref 250–450)
UIBC: 165 ug/dL

## 2020-04-15 LAB — TSH: TSH: 0.416 u[IU]/mL (ref 0.350–4.500)

## 2020-04-15 LAB — LACTATE DEHYDROGENASE: LDH: 113 U/L (ref 98–192)

## 2020-04-15 MED ORDER — PREDNISONE 20 MG PO TABS: ORAL_TABLET | ORAL | 0 refills | Status: DC

## 2020-04-15 MED ORDER — AZITHROMYCIN 250 MG PO TABS: ORAL_TABLET | ORAL | 0 refills | Status: DC

## 2020-04-15 MED ORDER — SODIUM CHLORIDE 0.9 % IV SOLN
Freq: Once | INTRAVENOUS | Status: AC
  Filled 2020-04-15: qty 250

## 2020-04-15 MED ORDER — IRON SUCROSE 20 MG/ML IV SOLN
200.0000 mg | Freq: Once | INTRAVENOUS | Status: AC
  Administered 2020-04-15: 200 mg via INTRAVENOUS
  Filled 2020-04-15: qty 10

## 2020-04-15 NOTE — Progress Notes (Signed)
Pt presents with back of the head and neck pain that has been going on for the last 2-3 weeks. Describes it as a tense feeling. When he takes tylenol it does help ease off the pain.

## 2020-04-15 NOTE — Progress Notes (Signed)
[place] NOTE  Patient Care Team: [name], [name], [name], [name]; [name]; [name]; mediastinal uptake; right pubic rami uptake.  # [name]; Jan 18th 2021- switched to [facility]- [pt preference; [name]; July 2020 cystoscopy-no evidence of bladder malignancy/Dr. Bernardo Heater- enlarged prostate [PSA- 0.95; 2021]  # Molecular testing- PDL-1 CPS- 20%; [name]  ;GOALS: palliative  CURRENT/MOST RECENT THERAPY:OPDIVO [C]     Cancer of overlapping sites of bladder ([place])  02/28/2018 - 06/09/2019 Chemotherapy   The patient had atezolizumab (TECENTRIQ) 1,[name], 1,200 mg, Intravenous, Once, [name],200 mg (02/28/2018), 1,200 mg (03/21/2018), 1,200 mg (04/11/2018), 1,200 mg (05/02/2018), 1,200 mg (06/13/2018), 1,200 mg (05/23/2018), 1,200 mg (07/04/2018), 1,200 mg (07/25/2018), 1,200 mg (08/15/2018), 1,200 mg (09/05/2018), 1,200 mg (10/25/2018), 1,200 mg (11/22/2018), 1,200 mg (12/20/2018), 1,200 mg (01/10/2019), 1,200 mg (01/31/2019), 1,200 mg (02/21/2019), 1,200 mg (03/14/2019), 1,200 mg (04/04/2019), 1,200 mg (04/25/2019), 1,200 mg (05/16/2019), 1,200 mg (06/09/2019)  for chemotherapy treatment.    06/30/2019 -  Chemotherapy   The patient had nivolumab  (OPDIVO) 240 mg in sodium chloride 0.9 % 100 mL chemo infusion, 240 mg, Intravenous, Once, 18 of 22 cycles Administration: 240 mg (06/30/2019), 240 mg (07/14/2019), 240 mg (07/28/2019), 240 mg (08/11/2019), 240 mg (08/25/2019), 240 mg (09/08/2019), 240 mg (09/22/2019), 240 mg (11/03/2019), 240 mg (11/17/2019), 240 mg (12/10/2019), 240 mg (12/24/2019), 240 mg (01/08/2020), 240 mg (01/22/2020), 240 mg (02/05/2020), 240 mg (02/19/2020), 240 mg (03/04/2020), 240 mg (03/18/2020), 240 mg (04/01/2020)  for chemotherapy treatment.     HISTORY OF PRESENTING ILLNESS: Edward Cummings 86 y.o.  male with metastatic transitional carcinoma of the bladder currently on Opdivo is here for follow-up/review results of the CT scan.  Patient denies any nausea vomiting or diarrhea.  Appetite is fair.  No weight loss.  Complains of neck pain especially with movement of the last 2 weeks.  Denies any direct trauma.  Complains of mild cough.  Otherwise no fevers.  No chills.  No unusual shortness of breath.  Review of Systems  Constitutional: Positive for malaise/fatigue. Negative for chills, diaphoresis and fever.  HENT: Negative for nosebleeds and sore throat.   Eyes: Negative for double vision.  Respiratory: Positive for cough. Negative for hemoptysis, sputum production and wheezing.   Cardiovascular: Negative for chest pain, palpitations, orthopnea and leg swelling.  Gastrointestinal: Negative for abdominal pain, blood in stool, diarrhea, heartburn, melena, nausea and vomiting.  Genitourinary: Positive for frequency and urgency. Negative for dysuria.  Musculoskeletal: Positive for back pain. Negative for joint pain.  Skin: Negative.  Negative for itching and rash.  Neurological: Negative for tingling, focal weakness, weakness and headaches.  Endo/Heme/Allergies: Does not bruise/bleed easily.  Psychiatric/Behavioral: Negative for depression. The patient is not nervous/anxious and does not have insomnia.      MEDICAL HISTORY:  Past  Medical History:  Diagnosis Date  . Anemia   . Anxiety 12/31/2019  . Cancer ([place])    bladder  . Chronic kidney disease   . Depression   . Hypertension   . Neuromuscular disorder ([place])    Nerve damage to left face/eye since around 2002.    SURGICAL HISTORY: Past Surgical History:  Procedure Laterality Date  . CYSTOSCOPY W/ RETROGRADES Bilateral 01/25/2018   Procedure: CYSTOSCOPY WITH RETROGRADE PYELOGRAM;  [name], MD;  [name];  Service: Urology;  Laterality: Bilateral;  . CYSTOSCOPY W/ URETERAL STENT PLACEMENT Bilateral 01/06/2019   Procedure: CYSTOSCOPY WITH RETROGRADE PYELOGRAM/URETERAL STENT REMOVAL;  [name], MD;  [name];  Service: Urology;  Laterality: Bilateral;  . CYSTOSCOPY WITH STENT PLACEMENT Bilateral 01/25/2018   Procedure: CYSTOSCOPY WITH STENT PLACEMENT;  [name], MD;  [name];  Service: Urology;  Laterality: Bilateral;  . DORSAL SLIT N/A 01/06/2019   Procedure: DORSAL SLIT;  [name], MD;  [name];  Service: Urology;  [name];  . ESOPHAGOGASTRODUODENOSCOPY (EGD) WITH PROPOFOL N/A 11/12/2019   Procedure: ESOPHAGOGASTRODUODENOSCOPY (EGD) WITH PROPOFOL;  Surgeon: Virgel Manifold, MD;  Location: [facility];  Service: Endoscopy;  [name];  . EYE SURGERY     Cornea transplants bilaterally & cataract surgery.  . TRANSURETHRAL RESECTION OF BLADDER TUMOR N/A 01/25/2018   Procedure: TRANSURETHRAL RESECTION OF BLADDER TUMOR (TURBT);  [name], MD;  [name];  Service: Urology;  [name];    SOCIAL HISTORY: lives in [place]; with wife; quit smoking 18 years ago; beer every 2 months or so.mechanic/retd.   Social History   Socioeconomic History  . Marital status: Married    Spouse name: [name]  . Number of children: Not on file  . Years of education: Not on file  . Highest education level: Not on file   Occupational History  . Not on file  Tobacco Use  . Smoking status: Former Smoker    Types: Cigarettes  . Smokeless tobacco: Current User    Types: Chew  . Tobacco comment: Stopped approximately 10 years ago.  Vaping Use  . Vaping Use: Never used  Substance and Sexual Activity  . Alcohol use: Not Currently    Alcohol/week: 2.0 standard drinks    Types: 2 Cans of beer per week    Comment: Daily  . Drug use: Never  . Sexual activity: Yes    Birth control/protection: None  Other Topics Concern  . Not on file  Social History Narrative  . Not on file   Social Determinants of Health   Financial Resource Strain:   . Difficulty of Paying Living Expenses: Not on file  Food Insecurity:   . Worried About Charity fundraiser in the Last Year: Not on file  . Ran Out of Food in the Last Year: Not on file  Transportation Needs:   . Lack of Transportation (Medical): Not on file  . Lack of Transportation (Non-Medical): Not on file  Physical Activity:   . Days of Exercise per Week: Not on file  . Minutes of Exercise per Session: Not on file  Stress:   . Feeling of Stress : Not on file  Social Connections:   . Frequency of Communication with Friends and Family: Not on file  . Frequency of Social Gatherings with Friends and Family: Not on file  . Attends Religious Services: Not on file  . Active Member of Clubs or  Organizations: Not on file  . Attends Archivist Meetings: Not on file  . Marital Status: Not on file  Intimate Partner Violence:   . Fear of Current or Ex-Partner: Not on file  . Emotionally Abused: Not on file  . Physically Abused: Not on file  . Sexually Abused: Not on file    FAMILY HISTORY: Family History  Problem Relation Age of Onset  . Prostate cancer Neg Hx   . Kidney cancer Neg Hx   . Bladder Cancer Neg Hx     ALLERGIES:  has No Known Allergies.  MEDICATIONS:  Current Outpatient Medications  Medication Sig Dispense Refill  . aspirin EC 81  MG tablet Take 81 mg by mouth daily.     Marland Kitchen docusate sodium (COLACE) 50 MG capsule Take 50 mg by mouth at bedtime.    . fexofenadine (ALLEGRA) 180 MG tablet Take 180 mg by mouth daily.     . fluticasone (FLONASE) 50 MCG/ACT nasal spray Place 2 sprays into both nostrils daily as needed for allergies or rhinitis. 11.1 mL 6  . levothyroxine (SYNTHROID) 88 MCG tablet Take 1 tablet (88 mcg total) by mouth daily before breakfast. Empty stomach- 1 hour prior to breakfast. 90 tablet 3  . loratadine (CLARITIN) 10 MG tablet Take 1 tablet (10 mg total) by mouth daily. 30 tablet 11  . Multiple Vitamin (MULTIVITAMIN WITH MINERALS) TABS tablet Take 1 tablet by mouth daily.     . prednisoLONE acetate (PRED FORTE) 1 % ophthalmic suspension Place 1 drop into both eyes daily.    . tamsulosin (FLOMAX) 0.4 MG CAPS capsule Take 1 capsule (0.4 mg total) by mouth at bedtime. 90 capsule 1  . amLODipine (NORVASC) 5 MG tablet TAKE 1 TABLET BY MOUTH ONCE A DAY (Patient not taking: Reported on 04/15/2020) 90 tablet 3  . azithromycin (ZITHROMAX) 250 MG tablet Take 2 on day 1; and then 1 pill once a day. 6 each 0  . HYDROcodone-acetaminophen (NORCO/VICODIN) 5-325 MG tablet Take 1 tablet by mouth every 8 (eight) hours as needed for moderate pain. (Patient not taking: Reported on 04/01/2020) 30 tablet 0  . oxybutynin (DITROPAN) 5 MG tablet Take 1 tablet by mouth 3 (three) times daily as needed. (Patient not taking: Reported on 04/01/2020)    . predniSONE (DELTASONE) 20 MG tablet Take 2 pills a day x1 week; and then 1 pill a day x 1 week. Take in AM/ with breakfast. 30 tablet 0   No current facility-administered medications for this visit.   Facility-Administered Medications Ordered in Other Visits  Medication Dose Route Frequency Provider Last Rate Last Admin  . 0.9 %  sodium chloride infusion   Intravenous Once Charlaine Dalton R, MD      . iron sucrose (VENOFER) injection 200 mg  200 mg Intravenous Once Charlaine Dalton  R, MD          .  PHYSICAL EXAMINATION: ECOG PERFORMANCE STATUS: 1 - Symptomatic but completely ambulatory  Vitals:   04/15/20 0836  BP: (!) 137/46  Pulse: (!) 52  Resp: 16  Temp: 98.7 F (37.1 C)  SpO2: 99%   Filed Weights   04/15/20 0836  Weight: 121 lb 12.8 oz (55.2 kg)    Physical Exam Constitutional:      Comments: Walking himself.  Thin built moderately nourished male patient.  HENT:     Head: Normocephalic and atraumatic.     Mouth/Throat:     Pharynx: No oropharyngeal exudate.  Eyes:  Pupils: Pupils are equal, round, and reactive to light.     Comments: Chronic drooping of the left eyelid.  Cardiovascular:     Rate and Rhythm: Normal rate and regular rhythm.  Pulmonary:     Effort: No respiratory distress.     Breath sounds: No wheezing.  Abdominal:     General: Bowel sounds are normal. There is no distension.     Palpations: Abdomen is soft. There is no mass.     Tenderness: There is no abdominal tenderness. There is no guarding or rebound.  Musculoskeletal:        General: No tenderness. Normal range of motion.     Cervical back: Normal range of motion and neck supple.  Skin:    General: Skin is warm.  Neurological:     Mental Status: He is alert and oriented to person, place, and time.  Psychiatric:        Mood and Affect: Affect normal.      LABORATORY DATA:  I have reviewed the data as listed Lab Results  Component Value Date   WBC 8.4 04/15/2020   HGB 8.0 (L) 04/15/2020   HCT 23.9 (L) 04/15/2020   MCV 98.4 04/15/2020   PLT 220 04/15/2020   Recent Labs    02/05/20 0835 02/05/20 0835 02/19/20 0813 02/19/20 0813 03/04/20 0801 03/04/20 0801 03/18/20 0819 04/01/20 0815 04/15/20 0818  NA 135   < > 135   < > 137   < > 138 137 135  K 4.3   < > 4.7   < > 4.7   < > 4.9 4.3 4.8  CL 107   < > 106   < > 107   < > 109 107 107  CO2 21*   < > 21*   < > 23   < > _0 GLUCOSE 139*   < > 101*   < > 98   < > 98 126* 90  BUN 49*   < >  34*   < > 33*   < > 39* 34* 33*  CREATININE 2.30*   < > 2.24*   < > 2.29*   < > 2.25* 2.11* 1.95*  CALCIUM 9.0   < > 8.9   < > 8.9   < > 9.2 9.0 8.9  GFRNONAA 25*   < > 26*   < > 25*   < > 26* 30* 33*  GFRAA 29*  --  30*  --  29*  --   --   --   --   PROT 7.1   < > 7.0   < > 7.0   < > 7.1 6.9 6.9  ALBUMIN 4.0   < > 3.9   < > 4.0   < > 4.0 3.8 3.5  AST 20   < > 17   < > 18   < > _1 ALT 13   < > 12   < > 13   < > _2 ALKPHOS 70   < > 70   < > 65   < > 66 69 72  BILITOT 0.6   < > 0.8   < > 0.6   < > 0.8 0.5 0.8   < > = values in this interval not displayed.    RADIOGRAPHIC STUDIES: I have personally reviewed the radiological images as listed and agreed with the findings in the report. CT Abdomen Pelvis Wo Contrast  Result Date: 04/12/2020 CLINICAL DATA:  Restaging bladder cancer. Low back pain for the last several days. EXAM: CT CHEST, ABDOMEN AND PELVIS WITHOUT CONTRAST TECHNIQUE: Multidetector CT imaging of the chest, abdomen and pelvis was performed following the standard protocol without IV contrast. COMPARISON:  12/09/2019 FINDINGS: CT CHEST FINDINGS Cardiovascular: Coronary, aortic arch, and branch vessel atherosclerotic vascular disease. Upper normal sized ascending thoracic aorta. Low-density blood pool suggests anemia. Mediastinum/Nodes: Contrast medium in the esophagus suggesting dysmotility or reflux. Small mediastinal lymph nodes are not pathologically enlarged. Lungs/Pleura: Chronic appearing bilateral pleural calcifications likely from remote asbestos exposure. Biapical pleuroparenchymal scarring. Centrilobular emphysema. Subpleural reticulation favoring the lung bases. Scattered new ground-glass opacities in both lungs primarily conforming to secondary pulmonary lobular configurations but with greater confluence posteriorly in the left upper lobe and posteriorly in the superior segment left lower lobe. Given that these are new from previous, there most likely a  manifestation of atypical infectious process. Stable 0.5 cm sub solid nodule along the left hemidiaphragm on image 134/3. Chronic 2 mm right upper lobe pulmonary nodule on image 64/3, stable. Musculoskeletal: Thoracic spondylosis. CT ABDOMEN PELVIS FINDINGS Hepatobiliary: Contracted gallbladder.  Otherwise unremarkable. Pancreas: Unremarkable Spleen: Unremarkable Adrenals/Urinary Tract: Right-sided hypodense renal lesions, probably cysts. 1.2 by 1.1 cm hyperdense left kidney upper pole lesion on 68/2, probably a complex cyst but technically nonspecific on today's noncontrast CT. Without overt hydronephrosis stable mild fullness of both collecting systems, an without hydroureter. Stomach/Bowel: Unremarkable Vascular/Lymphatic: Aortoiliac atherosclerotic vascular disease. No pathologic adenopathy identified. Reproductive: Mild prostatomegaly. Other: No supplemental non-categorized findings. Musculoskeletal: Lower lumbar spondylosis and degenerative disc disease. IMPRESSION: 1. No findings of recurrent malignancy. 2. Scattered new ground-glass opacities in both lungs primarily conforming to secondary pulmonary lobular configurations but with greater confluence posteriorly in the left upper lobe and superior segment left lower lobe. Given that these are new from previous, there most likely a manifestation of atypical infectious process. Consider follow up CT chest in 3 months time to assess for stability. 3. Other imaging findings of potential clinical significance: Coronary atherosclerosis. Low-density blood pool suggests anemia. Chronic appearing bilateral pleural calcifications likely from remote asbestos exposure. Right-sided hypodense renal lesions, probably cysts. Mild prostatomegaly. Lower lumbar spondylosis and degenerative disc disease. Chronically stable 2 mm right lung nodule and chronically stable 5 mm sub solid nodule in the left lower lobe. 4. Emphysema and aortic atherosclerosis. Aortic Atherosclerosis  (ICD10-I70.0) and Emphysema (ICD10-J43.9). Electronically Signed   By: Van Clines M.D.   On: 04/12/2020 13:18   CT Chest Wo Contrast  Result Date: 04/12/2020 CLINICAL DATA:  Restaging bladder cancer. Low back pain for the last several days. EXAM: CT CHEST, ABDOMEN AND PELVIS WITHOUT CONTRAST TECHNIQUE: Multidetector CT imaging of the chest, abdomen and pelvis was performed following the standard protocol without IV contrast. COMPARISON:  12/09/2019 FINDINGS: CT CHEST FINDINGS Cardiovascular: Coronary, aortic arch, and branch vessel atherosclerotic vascular disease. Upper normal sized ascending thoracic aorta. Low-density blood pool suggests anemia. Mediastinum/Nodes: Contrast medium in the esophagus suggesting dysmotility or reflux. Small mediastinal lymph nodes are not pathologically enlarged. Lungs/Pleura: Chronic appearing bilateral pleural calcifications likely from remote asbestos exposure. Biapical pleuroparenchymal scarring. Centrilobular emphysema. Subpleural reticulation favoring the lung bases. Scattered new ground-glass opacities in both lungs primarily conforming to secondary pulmonary lobular configurations but with greater confluence posteriorly in the left upper lobe and posteriorly in the superior segment left lower lobe. Given that these are new from previous, there most likely a manifestation of atypical infectious process. Stable 0.5 cm sub  solid nodule along the left hemidiaphragm on image 134/3. Chronic 2 mm right upper lobe pulmonary nodule on image 64/3, stable. Musculoskeletal: Thoracic spondylosis. CT ABDOMEN PELVIS FINDINGS Hepatobiliary: Contracted gallbladder.  Otherwise unremarkable. Pancreas: Unremarkable Spleen: Unremarkable Adrenals/Urinary Tract: Right-sided hypodense renal lesions, probably cysts. 1.2 by 1.1 cm hyperdense left kidney upper pole lesion on 68/2, probably a complex cyst but technically nonspecific on today's noncontrast CT. Without overt hydronephrosis  stable mild fullness of both collecting systems, an without hydroureter. Stomach/Bowel: Unremarkable Vascular/Lymphatic: Aortoiliac atherosclerotic vascular disease. No pathologic adenopathy identified. Reproductive: Mild prostatomegaly. Other: No supplemental non-categorized findings. Musculoskeletal: Lower lumbar spondylosis and degenerative disc disease. IMPRESSION: 1. No findings of recurrent malignancy. 2. Scattered new ground-glass opacities in both lungs primarily conforming to secondary pulmonary lobular configurations but with greater confluence posteriorly in the left upper lobe and superior segment left lower lobe. Given that these are new from previous, there most likely a manifestation of atypical infectious process. Consider follow up CT chest in 3 months time to assess for stability. 3. Other imaging findings of potential clinical significance: Coronary atherosclerosis. Low-density blood pool suggests anemia. Chronic appearing bilateral pleural calcifications likely from remote asbestos exposure. Right-sided hypodense renal lesions, probably cysts. Mild prostatomegaly. Lower lumbar spondylosis and degenerative disc disease. Chronically stable 2 mm right lung nodule and chronically stable 5 mm sub solid nodule in the left lower lobe. 4. Emphysema and aortic atherosclerosis. Aortic Atherosclerosis (ICD10-I70.0) and Emphysema (ICD10-J43.9). Electronically Signed   By: Van Clines M.D.   On: 04/12/2020 13:18    ASSESSMENT & PLAN:   Cancer of overlapping sites of bladder ([place]) # High-grade transitional cell carcinoma of the bladder metastatic to retroperitoneal lymph node.  Stage IV; JNOV 1s, 2021-  CT-chest and pelvis-noncontrast- . No findings of recurrent malignancy; Bilateral Ground glass opacities noted [see below]  # HOLD OPDIVO today; given GOO [see below];  # Incidentral [NOv 1st, 2021- ] new ground-glass opacities in both lungs primarily-clinically suspect atypical infection  [mildly asymptomatic except for mild cough]; do not suspect Covid [vaccinated; booster].  Question pneumonitis from Opdivo/atypical infection.  Recommend prednisone 40 mg a day for 1 week and then 20 mg a day.  Also recommend azithromycin.  # Iatrogenic hypothyroidism-on Synthroid 88 mcg; STABLE TSH AUG 2021- WNL.  Stable  # Anemia sec to CKD/ on Iv iron.Hb-8.0  iron sats- 39% [June 2021]; STABLE s/p cysto [02/11/20]; add iron infusions today.  We will recheck iron studies LDH today.  # CKD stage IV-GFR 25-STABLE  I spoke at length with the patient's son, Ronalee Belts- regarding the patient's clinical status/plan of care.  Family agreement.   # DISPOSITION: add iron studies/ferritin;LDH today # HOLD OPDIVO; Proceed  IV venofer ONLY today # Follow-up in 2 weeks- -MD labs-cbc/cmp;opdivo IV; IV venofer; -Dr.B  # I reviewed the blood work- with the patient in detail; also reviewed the imaging independently [as summarized above]; and with the patient in detail.     All questions were answered. The patient knows to call the clinic with any problems, questions or concerns.    Cammie Sickle, MD 04/15/2020 9:41 AM

## 2020-04-15 NOTE — Progress Notes (Signed)
1043- Patient tolerated Venofer infusion well. Patient discharged to home at this time.

## 2020-04-15 NOTE — Assessment & Plan Note (Addendum)
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  Stage IV; JNOV 1s, 2021-  CT-chest and pelvis-noncontrast- . No findings of recurrent malignancy; Bilateral Ground glass opacities noted [see below]  # HOLD OPDIVO today; given GOO [see below];  # Incidentral [NOv 1st, 2021- ] new ground-glass opacities in both lungs primarily-clinically suspect atypical infection [mildly asymptomatic except for mild cough]; do not suspect Covid [vaccinated; booster].  Question pneumonitis from Opdivo/atypical infection.  Recommend prednisone 40 mg a day for 1 week and then 20 mg a day.  Also recommend azithromycin.  # Iatrogenic hypothyroidism-on Synthroid 88 mcg; STABLE TSH AUG 2021- WNL.  Stable  # Anemia sec to CKD/ on Iv iron.Hb-8.0  iron sats- 39% [June 2021]; STABLE s/p cysto [02/11/20]; add iron infusions today.  We will recheck iron studies LDH today.  # CKD stage IV-GFR 25-STABLE  I spoke at length with the patient's son, Edward Cummings- regarding the patient's clinical status/plan of care.  Family agreement.   # DISPOSITION: add iron studies/ferritin;LDH today # HOLD OPDIVO; Proceed  IV venofer ONLY today # Follow-up in 2 weeks- -MD labs-cbc/cmp;opdivo IV; IV venofer; -Dr.B  # I reviewed the blood work- with the patient in detail; also reviewed the imaging independently [as summarized above]; and with the patient in detail.

## 2020-04-29 ENCOUNTER — Inpatient Hospital Stay: Payer: Medicare HMO

## 2020-04-29 ENCOUNTER — Encounter: Payer: Self-pay | Admitting: Internal Medicine

## 2020-04-29 ENCOUNTER — Inpatient Hospital Stay: Payer: Medicare HMO | Admitting: Internal Medicine

## 2020-04-29 VITALS — BP 146/74 | HR 59 | Resp 18

## 2020-04-29 VITALS — BP 149/56 | HR 57 | Temp 97.4°F | Resp 16 | Wt 125.3 lb

## 2020-04-29 DIAGNOSIS — C678 Malignant neoplasm of overlapping sites of bladder: Secondary | ICD-10-CM

## 2020-04-29 DIAGNOSIS — Z7189 Other specified counseling: Secondary | ICD-10-CM

## 2020-04-29 DIAGNOSIS — D631 Anemia in chronic kidney disease: Secondary | ICD-10-CM

## 2020-04-29 DIAGNOSIS — N1831 Chronic kidney disease, stage 3a: Secondary | ICD-10-CM

## 2020-04-29 DIAGNOSIS — Z5112 Encounter for antineoplastic immunotherapy: Secondary | ICD-10-CM | POA: Diagnosis not present

## 2020-04-29 LAB — CBC WITH DIFFERENTIAL/PLATELET
Abs Immature Granulocytes: 0.16 10*3/uL — ABNORMAL HIGH (ref 0.00–0.07)
Basophils Absolute: 0 10*3/uL (ref 0.0–0.1)
Basophils Relative: 0 %
Eosinophils Absolute: 0.2 10*3/uL (ref 0.0–0.5)
Eosinophils Relative: 2 %
HCT: 28.1 % — ABNORMAL LOW (ref 39.0–52.0)
Hemoglobin: 9.3 g/dL — ABNORMAL LOW (ref 13.0–17.0)
Immature Granulocytes: 1 %
Lymphocytes Relative: 23 %
Lymphs Abs: 3.1 10*3/uL (ref 0.7–4.0)
MCH: 33.1 pg (ref 26.0–34.0)
MCHC: 33.1 g/dL (ref 30.0–36.0)
MCV: 100 fL (ref 80.0–100.0)
Monocytes Absolute: 0.8 10*3/uL (ref 0.1–1.0)
Monocytes Relative: 6 %
Neutro Abs: 9.2 10*3/uL — ABNORMAL HIGH (ref 1.7–7.7)
Neutrophils Relative %: 68 %
Platelets: 245 10*3/uL (ref 150–400)
RBC: 2.81 MIL/uL — ABNORMAL LOW (ref 4.22–5.81)
RDW: 13.5 % (ref 11.5–15.5)
WBC: 13.4 10*3/uL — ABNORMAL HIGH (ref 4.0–10.5)
nRBC: 0 % (ref 0.0–0.2)

## 2020-04-29 LAB — COMPREHENSIVE METABOLIC PANEL
ALT: 28 U/L (ref 0–44)
AST: 23 U/L (ref 15–41)
Albumin: 3.6 g/dL (ref 3.5–5.0)
Alkaline Phosphatase: 59 U/L (ref 38–126)
Anion gap: 9 (ref 5–15)
BUN: 44 mg/dL — ABNORMAL HIGH (ref 8–23)
CO2: 24 mmol/L (ref 22–32)
Calcium: 9 mg/dL (ref 8.9–10.3)
Chloride: 104 mmol/L (ref 98–111)
Creatinine, Ser: 2.12 mg/dL — ABNORMAL HIGH (ref 0.61–1.24)
GFR, Estimated: 30 mL/min — ABNORMAL LOW (ref >60)
Glucose, Bld: 128 mg/dL — ABNORMAL HIGH (ref 70–99)
Potassium: 3.6 mmol/L (ref 3.5–5.1)
Sodium: 137 mmol/L (ref 135–145)
Total Bilirubin: 0.7 mg/dL (ref 0.3–1.2)
Total Protein: 6.4 g/dL — ABNORMAL LOW (ref 6.5–8.1)

## 2020-04-29 MED ORDER — IRON SUCROSE 20 MG/ML IV SOLN
200.0000 mg | Freq: Once | INTRAVENOUS | Status: AC
  Administered 2020-04-29: 200 mg via INTRAVENOUS
  Filled 2020-04-29: qty 10

## 2020-04-29 MED ORDER — SODIUM CHLORIDE 0.9 % IV SOLN
Freq: Once | INTRAVENOUS | Status: AC
  Filled 2020-04-29: qty 250

## 2020-04-29 MED ORDER — SODIUM CHLORIDE 0.9 % IV SOLN
240.0000 mg | Freq: Once | INTRAVENOUS | Status: AC
  Administered 2020-04-29: 240 mg via INTRAVENOUS
  Filled 2020-04-29: qty 24

## 2020-04-29 NOTE — Progress Notes (Signed)
1124- Patient tolerated treatment well. Patient discharged to home at this time.

## 2020-04-29 NOTE — Assessment & Plan Note (Addendum)
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  Stage IV; JNOV 1s, 2021-  CT-chest and pelvis-noncontrast-STABLE . No findings of recurrent malignancy; Bilateral Ground glass opacities noted [see below]  # Proceed with opdivo today. Labs today reviewed;  acceptable for treatment today.   # Incidentral [NOv 1st, 2021- ] new ground-glass opacities in both lungs primarily-clinically suspect atypical infection s/p prednisone + azithromycin; will monitor closely. WBC- 13- from prednisone- Improved. .    # Iatrogenic hypothyroidism-on Synthroid 88 mcg; STABLE TSH AUG 2021- WNL.  STABLE>   # Anemia sec to CKD/ on Iv iron.Hb-9.2- iron sats- 11% [June 2021]; STABLE s/p cysto [02/11/20];   # CKD stage IV-GFR 30-STABLE. STABLE  # DISPOSITION:  # today OPDIVO; Proceed  IV venofer.  # Follow-up in 2 weeks- -MD labs-cbc/cmp;opdivo IV; IV venofer; -Dr.B

## 2020-04-29 NOTE — Progress Notes (Signed)
[place] NOTE  Patient Care Team: [name], [name], [name], [name]; [name]; [name]; mediastinal uptake; right pubic rami uptake.  # [id number] Cummings; Jan 18th 2021- switched to [facility]- [pt preference; [name]; July 2020 cystoscopy-no evidence of bladder malignancy/Dr. Bernardo Heater- enlarged prostate [PSA- 0.95; 2021]  # Molecular testing- PDL-1 CPS- 20%; [name]  ;GOALS: palliative  CURRENT/MOST RECENT THERAPY:OPDIVO [C]     Cancer of overlapping sites of bladder ([place])  02/28/2018 - 06/09/2019 Chemotherapy   The patient had atezolizumab (TECENTRIQ) 1,[name], 1,200 mg, Intravenous, Once, [name],200 mg (02/28/2018), 1,200 mg (03/21/2018), 1,200 mg (04/11/2018), 1,200 mg (05/02/2018), 1,200 mg (06/13/2018), 1,200 mg (05/23/2018), 1,200 mg (07/04/2018), 1,200 mg (07/25/2018), 1,200 mg (08/15/2018), 1,200 mg (09/05/2018), 1,200 mg (10/25/2018), 1,200 mg (11/22/2018), 1,200 mg (12/20/2018), 1,200 mg (01/10/2019), 1,200 mg (01/31/2019), 1,200 mg (02/21/2019), 1,200 mg (03/14/2019), 1,200 mg (04/04/2019), 1,200 mg (04/25/2019), 1,200 mg (05/16/2019), 1,200 mg (06/09/2019)  for chemotherapy treatment.    06/30/2019 -  Chemotherapy   The patient had nivolumab  (OPDIVO) 240 mg in sodium chloride 0.9 % 100 mL chemo infusion, 240 mg, Intravenous, Once, 19 of 22 cycles Administration: 240 mg (06/30/2019), 240 mg (07/14/2019), 240 mg (07/28/2019), 240 mg (08/11/2019), 240 mg (08/25/2019), 240 mg (09/08/2019), 240 mg (09/22/2019), 240 mg (11/03/2019), 240 mg (11/17/2019), 240 mg (12/10/2019), 240 mg (12/24/2019), 240 mg (01/08/2020), 240 mg (01/22/2020), 240 mg (02/05/2020), 240 mg (02/19/2020), 240 mg (03/04/2020), 240 mg (03/18/2020), 240 mg (04/01/2020), 240 mg (04/29/2020)  for chemotherapy treatment.     HISTORY OF PRESENTING ILLNESS: Edward Cummings 86 y.o.  male with metastatic transitional carcinoma of the bladder currently on Opdivo is here for follow-up.  At last visit 2 weeks ago patient Edward Cummings was held because of groundglass opacities.  Patient was fairly asymptomatic.  Patient treated with azithromycin prednisone.  Denies any fevers or chills.  No nausea no vomiting.  No shortness or cough.  Appetite is improving.   Review of Systems  Constitutional: Positive for malaise/fatigue. Negative for chills, diaphoresis and fever.  HENT: Negative for nosebleeds and sore throat.   Eyes: Negative for double vision.  Respiratory: Negative for hemoptysis, sputum production and wheezing.   Cardiovascular: Negative for chest pain, palpitations, orthopnea and leg swelling.  Gastrointestinal: Negative for abdominal pain, blood in stool, diarrhea, heartburn, melena, nausea and vomiting.  Genitourinary: Positive for frequency and urgency. Negative for dysuria.  Musculoskeletal: Positive for back pain. Negative for joint pain.  Skin: Negative.  Negative for itching and rash.  Neurological: Negative for tingling, focal weakness, weakness and headaches.  Endo/Heme/Allergies: Does not bruise/bleed easily.  Psychiatric/Behavioral: Negative for depression. The patient is not nervous/anxious and does not have insomnia.      MEDICAL HISTORY:  Past Medical History:  Diagnosis Date   . Anemia   . Anxiety 12/31/2019  .  Cancer ([place])    bladder  . Chronic kidney disease   . Depression   . Hypertension   . Neuromuscular disorder ([place])    Nerve damage to left face/eye since around 2002.    SURGICAL HISTORY: Past Surgical History:  Procedure Laterality Date  . CYSTOSCOPY W/ RETROGRADES Bilateral 01/25/2018   Procedure: CYSTOSCOPY WITH RETROGRADE PYELOGRAM;  [name], MD;  [name];  Service: Urology;  Laterality: Bilateral;  . CYSTOSCOPY W/ URETERAL STENT PLACEMENT Bilateral 01/06/2019   Procedure: CYSTOSCOPY WITH RETROGRADE PYELOGRAM/URETERAL STENT REMOVAL;  [name], MD;  [name];  Service: Urology;  Laterality: Bilateral;  . CYSTOSCOPY WITH STENT PLACEMENT Bilateral 01/25/2018   Procedure: CYSTOSCOPY WITH STENT PLACEMENT;  [name], MD;  [name];  Service: Urology;  Laterality: Bilateral;  . DORSAL SLIT N/A 01/06/2019   Procedure: DORSAL SLIT;  [name], MD;  [name];  Service: Urology;  [name];  . ESOPHAGOGASTRODUODENOSCOPY (EGD) WITH PROPOFOL N/A 11/12/2019   Procedure: ESOPHAGOGASTRODUODENOSCOPY (EGD) WITH PROPOFOL;  Surgeon: Virgel Manifold, MD;  Location: [facility];  Service: Endoscopy;  [name];  . EYE SURGERY     Cornea transplants bilaterally & cataract surgery.  . TRANSURETHRAL RESECTION OF BLADDER TUMOR N/A 01/25/2018   Procedure: TRANSURETHRAL RESECTION OF BLADDER TUMOR (TURBT);  [name], MD;  [name];  Service: Urology;  [name];    SOCIAL HISTORY: lives in [place]; with wife; quit smoking 18 years ago; beer every 2 months or so.mechanic/retd.   Social History   Socioeconomic History  . Marital status: Married    Spouse name: [name]  . Number of children: Not on file  . Years of education: Not on file  . Highest education level: Not on file  Occupational History  . Not on file   Tobacco Use  . Smoking status: Former Smoker    Types: Cigarettes  . Smokeless tobacco: Current User    Types: Chew  . Tobacco comment: Stopped approximately 10 years ago.  Vaping Use  . Vaping Use: Never used  Substance and Sexual Activity  . Alcohol use: Not Currently    Alcohol/week: 2.0 standard drinks    Types: 2 Cans of beer per week    Comment: Daily  . Drug use: Never  . Sexual activity: Yes    Birth control/protection: None  Other Topics Concern  . Not on file  Social History Narrative  . Not on file   Social Determinants of Health   Financial Resource Strain:   . Difficulty of Paying Living Expenses: Not on file  Food Insecurity:   . Worried About Charity fundraiser in the Last Year: Not on file  . Ran Out of Food in the Last Year: Not on file  Transportation Needs:   . Lack of Transportation (Medical): Not on file  . Lack of Transportation (Non-Medical): Not on file  Physical Activity:   . Days of Exercise per Week: Not on file  . Minutes of Exercise per Session: Not on file  Stress:   . Feeling of Stress : Not on file  Social Connections:   . Frequency of Communication with Friends and Family: Not on file  . Frequency of Social Gatherings with Friends and Family: Not on file  . Attends Religious Services: Not on file  . Active Member of Clubs or Organizations: Not on file  . Attends Archivist Meetings: Not  on file  . Marital Status: Not on file  Intimate Partner Violence:   . Fear of Current or Ex-Partner: Not on file  . Emotionally Abused: Not on file  . Physically Abused: Not on file  . Sexually Abused: Not on file    FAMILY HISTORY: Family History  Problem Relation Age of Onset  . Prostate cancer Neg Hx   . Kidney cancer Neg Hx   . Bladder Cancer Neg Hx     ALLERGIES:  has No Known Allergies.  MEDICATIONS:  Current Outpatient Medications  Medication Sig Dispense Refill  . aspirin EC 81 MG tablet Take 81 mg by mouth daily.      Marland Kitchen docusate sodium (COLACE) 50 MG capsule Take 50 mg by mouth at bedtime.    . fexofenadine (ALLEGRA) 180 MG tablet Take 180 mg by mouth daily.     . fluticasone (FLONASE) 50 MCG/ACT nasal spray Place 2 sprays into both nostrils daily as needed for allergies or rhinitis. 11.1 mL 6  . levothyroxine (SYNTHROID) 88 MCG tablet Take 1 tablet (88 mcg total) by mouth daily before breakfast. Empty stomach- 1 hour prior to breakfast. 90 tablet 3  . loratadine (CLARITIN) 10 MG tablet Take 1 tablet (10 mg total) by mouth daily. 30 tablet 11  . Multiple Vitamin (MULTIVITAMIN WITH MINERALS) TABS tablet Take 1 tablet by mouth daily.     . prednisoLONE acetate (PRED FORTE) 1 % ophthalmic suspension Place 1 drop into both eyes daily.    . tamsulosin (FLOMAX) 0.4 MG CAPS capsule Take 1 capsule (0.4 mg total) by mouth at bedtime. 90 capsule 1  . amLODipine (NORVASC) 5 MG tablet TAKE 1 TABLET BY MOUTH ONCE A DAY (Patient not taking: Reported on 04/15/2020) 90 tablet 3  . azithromycin (ZITHROMAX) 250 MG tablet Take 2 on day 1; and then 1 pill once a day. (Patient not taking: Reported on 04/29/2020) 6 each 0  . HYDROcodone-acetaminophen (NORCO/VICODIN) 5-325 MG tablet Take 1 tablet by mouth every 8 (eight) hours as needed for moderate pain. (Patient not taking: Reported on 04/01/2020) 30 tablet 0  . oxybutynin (DITROPAN) 5 MG tablet Take 1 tablet by mouth 3 (three) times daily as needed. (Patient not taking: Reported on 04/01/2020)    . predniSONE (DELTASONE) 20 MG tablet Take 2 pills a day x1 week; and then 1 pill a day x 1 week. Take in AM/ with breakfast. (Patient not taking: Reported on 04/29/2020) 30 tablet 0   No current facility-administered medications for this visit.      Marland Kitchen  PHYSICAL EXAMINATION: ECOG PERFORMANCE STATUS: 1 - Symptomatic but completely ambulatory  Vitals:   04/29/20 0815  BP: (!) 149/56  Pulse: (!) 57  Resp: 16  Temp: (!) 97.4 F (36.3 C)  SpO2: 100%   Filed Weights   04/29/20  0815  Weight: 125 lb 5 oz (56.8 kg)    Physical Exam Constitutional:      Comments: Walking himself.  Thin built moderately nourished male patient.  HENT:     Head: Normocephalic and atraumatic.     Mouth/Throat:     Pharynx: No oropharyngeal exudate.  Eyes:     Pupils: Pupils are equal, round, and reactive to light.     Comments: Chronic drooping of the left eyelid.  Cardiovascular:     Rate and Rhythm: Normal rate and regular rhythm.  Pulmonary:     Effort: No respiratory distress.     Breath sounds: No wheezing.  Abdominal:  General: Bowel sounds are normal. There is no distension.     Palpations: Abdomen is soft. There is no mass.     Tenderness: There is no abdominal tenderness. There is no guarding or rebound.  Musculoskeletal:        General: No tenderness. Normal range of motion.     Cervical back: Normal range of motion and neck supple.  Skin:    General: Skin is warm.  Neurological:     Mental Status: He is alert and oriented to person, place, and time.  Psychiatric:        Mood and Affect: Affect normal.      LABORATORY DATA:  I have reviewed the data as listed Lab Results  Component Value Date   WBC 13.4 (H) 04/29/2020   HGB 9.3 (L) 04/29/2020   HCT 28.1 (L) 04/29/2020   MCV 100.0 04/29/2020   PLT 245 04/29/2020   Recent Labs    02/05/20 0835 02/05/20 0835 02/19/20 0813 02/19/20 0813 03/04/20 0801 03/18/20 0819 04/01/20 0815 04/15/20 0818 04/29/20 0745  NA 135   < > 135   < > 137   < > 137 135 137  K 4.3   < > 4.7   < > 4.7   < > 4.3 4.8 3.6  CL 107   < > 106   < > 107   < > 107 107 104  CO2 21*   < > 21*   < > 23   < > '23 23 24  ' GLUCOSE 139*   < > 101*   < > 98   < > 126* 90 128*  BUN 49*   < > 34*   < > 33*   < > 34* 33* 44*  CREATININE 2.30*   < > 2.24*   < > 2.29*   < > 2.11* 1.95* 2.12*  CALCIUM 9.0   < > 8.9   < > 8.9   < > 9.0 8.9 9.0  GFRNONAA 25*   < > 26*   < > 25*   < > 30* 33* 30*  GFRAA 29*  --  30*  --  29*  --   --   --    --   PROT 7.1   < > 7.0   < > 7.0   < > 6.9 6.9 6.4*  ALBUMIN 4.0   < > 3.9   < > 4.0   < > 3.8 3.5 3.6  AST 20   < > 17   < > 18   < > '20 17 23  ' ALT 13   < > 12   < > 13   < > '13 12 28  ' ALKPHOS 70   < > 70   < > 65   < > 69 72 59  BILITOT 0.6   < > 0.8   < > 0.6   < > 0.5 0.8 0.7   < > = values in this interval not displayed.    RADIOGRAPHIC STUDIES: I have personally reviewed the radiological images as listed and agreed with the findings in the report. CT Abdomen Pelvis Wo Contrast  Result Date: 04/12/2020 CLINICAL DATA:  Restaging bladder cancer. Low back pain for the last several days. EXAM: CT CHEST, ABDOMEN AND PELVIS WITHOUT CONTRAST TECHNIQUE: Multidetector CT imaging of the chest, abdomen and pelvis was performed following the standard protocol without IV contrast. COMPARISON:  12/09/2019 FINDINGS: CT CHEST FINDINGS Cardiovascular: Coronary, aortic arch, and  branch vessel atherosclerotic vascular disease. Upper normal sized ascending thoracic aorta. Low-density blood pool suggests anemia. Mediastinum/Nodes: Contrast medium in the esophagus suggesting dysmotility or reflux. Small mediastinal lymph nodes are not pathologically enlarged. Lungs/Pleura: Chronic appearing bilateral pleural calcifications likely from remote asbestos exposure. Biapical pleuroparenchymal scarring. Centrilobular emphysema. Subpleural reticulation favoring the lung bases. Scattered new ground-glass opacities in both lungs primarily conforming to secondary pulmonary lobular configurations but with greater confluence posteriorly in the left upper lobe and posteriorly in the superior segment left lower lobe. Given that these are new from previous, there most likely a manifestation of atypical infectious process. Stable 0.5 cm sub solid nodule along the left hemidiaphragm on image 134/3. Chronic 2 mm right upper lobe pulmonary nodule on image 64/3, stable. Musculoskeletal: Thoracic spondylosis. CT ABDOMEN PELVIS FINDINGS  Hepatobiliary: Contracted gallbladder.  Otherwise unremarkable. Pancreas: Unremarkable Spleen: Unremarkable Adrenals/Urinary Tract: Right-sided hypodense renal lesions, probably cysts. 1.2 by 1.1 cm hyperdense left kidney upper pole lesion on 68/2, probably a complex cyst but technically nonspecific on today's noncontrast CT. Without overt hydronephrosis stable mild fullness of both collecting systems, an without hydroureter. Stomach/Bowel: Unremarkable Vascular/Lymphatic: Aortoiliac atherosclerotic vascular disease. No pathologic adenopathy identified. Reproductive: Mild prostatomegaly. Other: No supplemental non-categorized findings. Musculoskeletal: Lower lumbar spondylosis and degenerative disc disease. IMPRESSION: 1. No findings of recurrent malignancy. 2. Scattered new ground-glass opacities in both lungs primarily conforming to secondary pulmonary lobular configurations but with greater confluence posteriorly in the left upper lobe and superior segment left lower lobe. Given that these are new from previous, there most likely a manifestation of atypical infectious process. Consider follow up CT chest in 3 months time to assess for stability. 3. Other imaging findings of potential clinical significance: Coronary atherosclerosis. Low-density blood pool suggests anemia. Chronic appearing bilateral pleural calcifications likely from remote asbestos exposure. Right-sided hypodense renal lesions, probably cysts. Mild prostatomegaly. Lower lumbar spondylosis and degenerative disc disease. Chronically stable 2 mm right lung nodule and chronically stable 5 mm sub solid nodule in the left lower lobe. 4. Emphysema and aortic atherosclerosis. Aortic Atherosclerosis (ICD10-I70.0) and Emphysema (ICD10-J43.9). Electronically Signed   By: Van Clines M.D.   On: 04/12/2020 13:18   CT Chest Wo Contrast  Result Date: 04/12/2020 CLINICAL DATA:  Restaging bladder cancer. Low back pain for the last several days. EXAM:  CT CHEST, ABDOMEN AND PELVIS WITHOUT CONTRAST TECHNIQUE: Multidetector CT imaging of the chest, abdomen and pelvis was performed following the standard protocol without IV contrast. COMPARISON:  12/09/2019 FINDINGS: CT CHEST FINDINGS Cardiovascular: Coronary, aortic arch, and branch vessel atherosclerotic vascular disease. Upper normal sized ascending thoracic aorta. Low-density blood pool suggests anemia. Mediastinum/Nodes: Contrast medium in the esophagus suggesting dysmotility or reflux. Small mediastinal lymph nodes are not pathologically enlarged. Lungs/Pleura: Chronic appearing bilateral pleural calcifications likely from remote asbestos exposure. Biapical pleuroparenchymal scarring. Centrilobular emphysema. Subpleural reticulation favoring the lung bases. Scattered new ground-glass opacities in both lungs primarily conforming to secondary pulmonary lobular configurations but with greater confluence posteriorly in the left upper lobe and posteriorly in the superior segment left lower lobe. Given that these are new from previous, there most likely a manifestation of atypical infectious process. Stable 0.5 cm sub solid nodule along the left hemidiaphragm on image 134/3. Chronic 2 mm right upper lobe pulmonary nodule on image 64/3, stable. Musculoskeletal: Thoracic spondylosis. CT ABDOMEN PELVIS FINDINGS Hepatobiliary: Contracted gallbladder.  Otherwise unremarkable. Pancreas: Unremarkable Spleen: Unremarkable Adrenals/Urinary Tract: Right-sided hypodense renal lesions, probably cysts. 1.2 by 1.1 cm hyperdense left kidney upper pole lesion  on 68/2, probably a complex cyst but technically nonspecific on today's noncontrast CT. Without overt hydronephrosis stable mild fullness of both collecting systems, an without hydroureter. Stomach/Bowel: Unremarkable Vascular/Lymphatic: Aortoiliac atherosclerotic vascular disease. No pathologic adenopathy identified. Reproductive: Mild prostatomegaly. Other: No supplemental  non-categorized findings. Musculoskeletal: Lower lumbar spondylosis and degenerative disc disease. IMPRESSION: 1. No findings of recurrent malignancy. 2. Scattered new ground-glass opacities in both lungs primarily conforming to secondary pulmonary lobular configurations but with greater confluence posteriorly in the left upper lobe and superior segment left lower lobe. Given that these are new from previous, there most likely a manifestation of atypical infectious process. Consider follow up CT chest in 3 months time to assess for stability. 3. Other imaging findings of potential clinical significance: Coronary atherosclerosis. Low-density blood pool suggests anemia. Chronic appearing bilateral pleural calcifications likely from remote asbestos exposure. Right-sided hypodense renal lesions, probably cysts. Mild prostatomegaly. Lower lumbar spondylosis and degenerative disc disease. Chronically stable 2 mm right lung nodule and chronically stable 5 mm sub solid nodule in the left lower lobe. 4. Emphysema and aortic atherosclerosis. Aortic Atherosclerosis (ICD10-I70.0) and Emphysema (ICD10-J43.9). Electronically Signed   By: Van Clines M.D.   On: 04/12/2020 13:18    ASSESSMENT & PLAN:   Cancer of overlapping sites of bladder ([place]) # High-grade transitional cell carcinoma of the bladder metastatic to retroperitoneal lymph node.  Stage IV; JNOV 1s, 2021-  CT-chest and pelvis-noncontrast-STABLE . No findings of recurrent malignancy; Bilateral Ground glass opacities noted [see below]  # Proceed with opdivo today. Labs today reviewed;  acceptable for treatment today.   # Incidentral [NOv 1st, 2021- ] new ground-glass opacities in both lungs primarily-clinically suspect atypical infection s/p prednisone + azithromycin; will monitor closely. WBC- 13- from prednisone- Improved. .    # Iatrogenic hypothyroidism-on Synthroid 88 mcg; STABLE TSH AUG 2021- WNL.  STABLE>   # Anemia sec to CKD/ on Iv  iron.Hb-9.2- iron sats- 11% [June 2021]; STABLE s/p cysto [02/11/20];   # CKD stage IV-GFR 30-STABLE. STABLE  # DISPOSITION:  # today OPDIVO; Proceed  IV venofer.  # Follow-up in 2 weeks- -MD labs-cbc/cmp;opdivo IV; IV venofer; -Dr.B     All questions were answered. The patient knows to call the clinic with any problems, questions or concerns.    Cammie Sickle, MD 04/29/2020 4:41 PM

## 2020-05-03 DIAGNOSIS — R69 Illness, unspecified: Secondary | ICD-10-CM | POA: Diagnosis not present

## 2020-05-13 ENCOUNTER — Inpatient Hospital Stay (HOSPITAL_BASED_OUTPATIENT_CLINIC_OR_DEPARTMENT_OTHER): Payer: Medicare HMO | Admitting: Internal Medicine

## 2020-05-13 ENCOUNTER — Inpatient Hospital Stay: Payer: Medicare HMO

## 2020-05-13 ENCOUNTER — Other Ambulatory Visit: Payer: Self-pay

## 2020-05-13 ENCOUNTER — Encounter: Payer: Self-pay | Admitting: Internal Medicine

## 2020-05-13 ENCOUNTER — Inpatient Hospital Stay: Payer: Medicare HMO | Attending: Internal Medicine

## 2020-05-13 VITALS — BP 130/57 | HR 58 | Resp 18

## 2020-05-13 DIAGNOSIS — D631 Anemia in chronic kidney disease: Secondary | ICD-10-CM

## 2020-05-13 DIAGNOSIS — C678 Malignant neoplasm of overlapping sites of bladder: Secondary | ICD-10-CM

## 2020-05-13 DIAGNOSIS — N184 Chronic kidney disease, stage 4 (severe): Secondary | ICD-10-CM | POA: Insufficient documentation

## 2020-05-13 DIAGNOSIS — Z79899 Other long term (current) drug therapy: Secondary | ICD-10-CM | POA: Diagnosis not present

## 2020-05-13 DIAGNOSIS — C772 Secondary and unspecified malignant neoplasm of intra-abdominal lymph nodes: Secondary | ICD-10-CM | POA: Insufficient documentation

## 2020-05-13 DIAGNOSIS — Z87891 Personal history of nicotine dependence: Secondary | ICD-10-CM | POA: Insufficient documentation

## 2020-05-13 DIAGNOSIS — Z5112 Encounter for antineoplastic immunotherapy: Secondary | ICD-10-CM | POA: Insufficient documentation

## 2020-05-13 DIAGNOSIS — Z7982 Long term (current) use of aspirin: Secondary | ICD-10-CM | POA: Insufficient documentation

## 2020-05-13 DIAGNOSIS — N1831 Chronic kidney disease, stage 3a: Secondary | ICD-10-CM

## 2020-05-13 DIAGNOSIS — E039 Hypothyroidism, unspecified: Secondary | ICD-10-CM | POA: Diagnosis not present

## 2020-05-13 DIAGNOSIS — I10 Essential (primary) hypertension: Secondary | ICD-10-CM | POA: Insufficient documentation

## 2020-05-13 DIAGNOSIS — Z7189 Other specified counseling: Secondary | ICD-10-CM

## 2020-05-13 DIAGNOSIS — Z7952 Long term (current) use of systemic steroids: Secondary | ICD-10-CM | POA: Insufficient documentation

## 2020-05-13 LAB — COMPREHENSIVE METABOLIC PANEL
ALT: 19 U/L (ref 0–44)
AST: 25 U/L (ref 15–41)
Albumin: 3.4 g/dL — ABNORMAL LOW (ref 3.5–5.0)
Alkaline Phosphatase: 73 U/L (ref 38–126)
Anion gap: 4 — ABNORMAL LOW (ref 5–15)
BUN: 35 mg/dL — ABNORMAL HIGH (ref 8–23)
CO2: 24 mmol/L (ref 22–32)
Calcium: 8.8 mg/dL — ABNORMAL LOW (ref 8.9–10.3)
Chloride: 109 mmol/L (ref 98–111)
Creatinine, Ser: 2.2 mg/dL — ABNORMAL HIGH (ref 0.61–1.24)
GFR, Estimated: 29 mL/min — ABNORMAL LOW (ref >60)
Glucose, Bld: 126 mg/dL — ABNORMAL HIGH (ref 70–99)
Potassium: 4.5 mmol/L (ref 3.5–5.1)
Sodium: 137 mmol/L (ref 135–145)
Total Bilirubin: 0.8 mg/dL (ref 0.3–1.2)
Total Protein: 6.6 g/dL (ref 6.5–8.1)

## 2020-05-13 LAB — CBC WITH DIFFERENTIAL/PLATELET
Abs Immature Granulocytes: 0.04 10*3/uL (ref 0.00–0.07)
Basophils Absolute: 0.1 10*3/uL (ref 0.0–0.1)
Basophils Relative: 1 %
Eosinophils Absolute: 1.4 10*3/uL — ABNORMAL HIGH (ref 0.0–0.5)
Eosinophils Relative: 21 %
HCT: 28.1 % — ABNORMAL LOW (ref 39.0–52.0)
Hemoglobin: 9.2 g/dL — ABNORMAL LOW (ref 13.0–17.0)
Immature Granulocytes: 1 %
Lymphocytes Relative: 27 %
Lymphs Abs: 1.8 10*3/uL (ref 0.7–4.0)
MCH: 33.1 pg (ref 26.0–34.0)
MCHC: 32.7 g/dL (ref 30.0–36.0)
MCV: 101.1 fL — ABNORMAL HIGH (ref 80.0–100.0)
Monocytes Absolute: 0.5 10*3/uL (ref 0.1–1.0)
Monocytes Relative: 8 %
Neutro Abs: 2.9 10*3/uL (ref 1.7–7.7)
Neutrophils Relative %: 42 %
Platelets: 219 10*3/uL (ref 150–400)
RBC: 2.78 MIL/uL — ABNORMAL LOW (ref 4.22–5.81)
RDW: 13.3 % (ref 11.5–15.5)
WBC: 6.7 10*3/uL (ref 4.0–10.5)
nRBC: 0 % (ref 0.0–0.2)

## 2020-05-13 MED ORDER — SODIUM CHLORIDE 0.9 % IV SOLN
Freq: Once | INTRAVENOUS | Status: AC
  Filled 2020-05-13: qty 250

## 2020-05-13 MED ORDER — SODIUM CHLORIDE 0.9 % IV SOLN
240.0000 mg | Freq: Once | INTRAVENOUS | Status: AC
  Administered 2020-05-13: 240 mg via INTRAVENOUS
  Filled 2020-05-13: qty 24

## 2020-05-13 MED ORDER — IRON SUCROSE 20 MG/ML IV SOLN
200.0000 mg | Freq: Once | INTRAVENOUS | Status: AC
  Administered 2020-05-13: 200 mg via INTRAVENOUS
  Filled 2020-05-13: qty 10

## 2020-05-13 NOTE — Progress Notes (Signed)
[facility] NOTE  Patient Care Team: [name], [name], [name], [name]; [name]; [name]; mediastinal uptake; right pubic rami uptake.  # [name]; Jan 18th 2021- switched to [facility]- [pt preference; [name]; July 2020 cystoscopy-no evidence of bladder malignancy/Dr. Bernardo Heater- enlarged prostate [PSA- 0.95; 2021]  # Molecular testing- PDL-1 CPS- 20%; [name]  ;GOALS: palliative  CURRENT/MOST RECENT THERAPY:OPDIVO [C]     Cancer of overlapping sites of bladder ([place])  02/28/2018 - 06/09/2019 Chemotherapy   The patient had atezolizumab (TECENTRIQ) 1,[name], 1,200 mg, Intravenous, Once, [name],200 mg (02/28/2018), 1,200 mg (03/21/2018), 1,200 mg (04/11/2018), 1,200 mg (05/02/2018), 1,200 mg (06/13/2018), 1,200 mg (05/23/2018), 1,200 mg (07/04/2018), 1,200 mg (07/25/2018), 1,200 mg (08/15/2018), 1,200 mg (09/05/2018), 1,200 mg (10/25/2018), 1,200 mg (11/22/2018), 1,200 mg (12/20/2018), 1,200 mg (01/10/2019), 1,200 mg (01/31/2019), 1,200 mg (02/21/2019), 1,200 mg (03/14/2019), 1,200 mg (04/04/2019), 1,200 mg (04/25/2019), 1,200 mg (05/16/2019), 1,200 mg (06/09/2019)  for chemotherapy treatment.    06/30/2019 -  Chemotherapy   The patient had nivolumab  (OPDIVO) 240 mg in sodium chloride 0.9 % 100 mL chemo infusion, 240 mg, Intravenous, Once, 19 of 22 cycles Administration: 240 mg (06/30/2019), 240 mg (07/14/2019), 240 mg (07/28/2019), 240 mg (08/11/2019), 240 mg (08/25/2019), 240 mg (09/08/2019), 240 mg (09/22/2019), 240 mg (11/03/2019), 240 mg (11/17/2019), 240 mg (12/10/2019), 240 mg (12/24/2019), 240 mg (01/08/2020), 240 mg (01/22/2020), 240 mg (02/05/2020), 240 mg (02/19/2020), 240 mg (03/04/2020), 240 mg (03/18/2020), 240 mg (04/01/2020), 240 mg (04/29/2020)  for chemotherapy treatment.     HISTORY OF PRESENTING ILLNESS: Edward Cummings 86 y.o.  male with metastatic transitional carcinoma of the bladder currently on Opdivo is here for follow-up.  Patient admits to a good Thanksgiving with his family.  However, mourns the loss of his wife.  Denies any worsening shortness of breath or cough.  Appetite is good.  No weight loss.  No blood in stools or black-colored stools or any blood in urine.  Review of Systems  Constitutional: Positive for malaise/fatigue. Negative for chills, diaphoresis and fever.  HENT: Negative for nosebleeds and sore throat.   Eyes: Negative for double vision.  Respiratory: Negative for hemoptysis, sputum production and wheezing.   Cardiovascular: Negative for chest pain, palpitations, orthopnea and leg swelling.  Gastrointestinal: Negative for abdominal pain, blood in stool, diarrhea, heartburn, melena, nausea and vomiting.  Genitourinary: Positive for frequency and urgency. Negative for dysuria.  Musculoskeletal: Positive for back pain. Negative for joint pain.  Skin: Negative.  Negative for itching and rash.  Neurological: Negative for tingling, focal weakness, weakness and headaches.  Endo/Heme/Allergies: Does not bruise/bleed easily.  Psychiatric/Behavioral: Negative for depression. The patient is not nervous/anxious and does not have insomnia.      MEDICAL HISTORY:  Past Medical History:  Diagnosis Date   Anemia     Anxiety  12/31/2019   Cancer ([place])    bladder   Chronic kidney disease    Depression    Hypertension    Neuromuscular disorder ([place])    Nerve damage to left face/eye since around 2002.    SURGICAL HISTORY: Past Surgical History:  Procedure Laterality Date   CYSTOSCOPY W/ RETROGRADES Bilateral 01/25/2018   Procedure: CYSTOSCOPY WITH RETROGRADE PYELOGRAM;  [name], MD;  [name];  Service: Urology;  Laterality: Bilateral;   CYSTOSCOPY W/ URETERAL STENT PLACEMENT Bilateral 01/06/2019   Procedure: CYSTOSCOPY WITH RETROGRADE PYELOGRAM/URETERAL STENT REMOVAL;  [name], MD;  [name];  Service: Urology;  Laterality: Bilateral;   CYSTOSCOPY WITH STENT PLACEMENT Bilateral 01/25/2018   Procedure: CYSTOSCOPY WITH STENT PLACEMENT;  [name], MD;  [name];  Service: Urology;  Laterality: Bilateral;   DORSAL SLIT N/A 01/06/2019   Procedure: DORSAL SLIT;  [name], MD;  [name];  Service: Urology;  [name];   ESOPHAGOGASTRODUODENOSCOPY (EGD) WITH PROPOFOL N/A 11/12/2019   Procedure: ESOPHAGOGASTRODUODENOSCOPY (EGD) WITH PROPOFOL;  Surgeon: Virgel Manifold, MD;  Location: [facility];  Service: Endoscopy;  [name];   EYE SURGERY     Cornea transplants bilaterally & cataract surgery.   TRANSURETHRAL RESECTION OF BLADDER TUMOR N/A 01/25/2018   Procedure: TRANSURETHRAL RESECTION OF BLADDER TUMOR (TURBT);  [name], MD;  [name];  Service: Urology;  [name];    SOCIAL HISTORY: lives in [place]; with wife; quit smoking 18 years ago; beer every 2 months or so.mechanic/retd.   Social History   Socioeconomic History   Marital status: Married    Spouse name: [name]   Number of children: Not on file   Years of education: Not on file   Highest education level: Not on file  Occupational History   Not on file  Tobacco Use    Smoking status: Former Smoker    Types: Cigarettes   Smokeless tobacco: Current User    Types: Chew   Tobacco comment: Stopped approximately 10 years ago.  Vaping Use   Vaping Use: Never used  Substance and Sexual Activity   Alcohol use: Not Currently    Alcohol/week: 2.0 standard drinks    Types: 2 Cans of beer per week    Comment: Daily   Drug use: Never   Sexual activity: Yes    Birth control/protection: None  Other Topics Concern   Not on file  Social History Narrative   Not on file   Social Determinants of Health   Financial Resource Strain:    Difficulty of Paying Living Expenses: Not on file  Food Insecurity:    Worried About Charity fundraiser in the Last Year: Not on file   [employer] of Food in the Last Year: Not on file  Transportation Needs:    Lack of Transportation (Medical): Not on file   Lack of Transportation (Non-Medical): Not on file  Physical Activity:    Days of Exercise per Week: Not on file   Minutes of Exercise per Session: Not on file  Stress:    Feeling of Stress : Not on file  Social Connections:    Frequency of Communication with Friends and Family: Not on file   Frequency of Social Gatherings with Friends and Family: Not on file   Attends Religious Services: Not on file   Active Member of Clubs or Organizations: Not on file   Attends Club or  Organization Meetings: Not on file   Marital Status: Not on file  Intimate Partner Violence:    Fear of Current or Ex-Partner: Not on file   Emotionally Abused: Not on file   Physically Abused: Not on file   Sexually Abused: Not on file    FAMILY HISTORY: Family History  Problem Relation Age of Onset   Prostate cancer Neg Hx    Kidney cancer Neg Hx    Bladder Cancer Neg Hx     ALLERGIES:  has No Known Allergies.  MEDICATIONS:  Current Outpatient Medications  Medication Sig Dispense Refill   aspirin EC 81 MG tablet Take 81 mg by mouth daily.      docusate  sodium (COLACE) 50 MG capsule Take 50 mg by mouth at bedtime.     fexofenadine (ALLEGRA) 180 MG tablet Take 180 mg by mouth daily.      fluticasone (FLONASE) 50 MCG/ACT nasal spray Place 2 sprays into both nostrils daily as needed for allergies or rhinitis. 11.1 mL 6   levothyroxine (SYNTHROID) 88 MCG tablet Take 1 tablet (88 mcg total) by mouth daily before breakfast. Empty stomach- 1 hour prior to breakfast. 90 tablet 3   loratadine (CLARITIN) 10 MG tablet Take 1 tablet (10 mg total) by mouth daily. 30 tablet 11   Multiple Vitamin (MULTIVITAMIN WITH MINERALS) TABS tablet Take 1 tablet by mouth daily.      prednisoLONE acetate (PRED FORTE) 1 % ophthalmic suspension Place 1 drop into both eyes daily.     tamsulosin (FLOMAX) 0.4 MG CAPS capsule Take 1 capsule (0.4 mg total) by mouth at bedtime. 90 capsule 1   amLODipine (NORVASC) 5 MG tablet TAKE 1 TABLET BY MOUTH ONCE A DAY (Patient not taking: Reported on 04/15/2020) 90 tablet 3   HYDROcodone-acetaminophen (NORCO/VICODIN) 5-325 MG tablet Take 1 tablet by mouth every 8 (eight) hours as needed for moderate pain. (Patient not taking: Reported on 04/01/2020) 30 tablet 0   oxybutynin (DITROPAN) 5 MG tablet Take 1 tablet by mouth 3 (three) times daily as needed. (Patient not taking: Reported on 04/01/2020)     predniSONE (DELTASONE) 20 MG tablet Take 2 pills a day x1 week; and then 1 pill a day x 1 week. Take in AM/ with breakfast. (Patient not taking: Reported on 04/29/2020) 30 tablet 0   No current facility-administered medications for this visit.      Marland Kitchen  PHYSICAL EXAMINATION: ECOG PERFORMANCE STATUS: 1 - Symptomatic but completely ambulatory  Vitals:   05/13/20 0826  BP: (!) 127/49  Pulse: (!) 59  Resp: 16  Temp: 97.8 F (36.6 C)  SpO2: 100%   Filed Weights   05/13/20 0826  Weight: 123 lb (55.8 kg)    Physical Exam Constitutional:      Comments: Walking himself.  Thin built moderately nourished male patient.  HENT:      Head: Normocephalic and atraumatic.     Mouth/Throat:     Pharynx: No oropharyngeal exudate.  Eyes:     Pupils: Pupils are equal, round, and reactive to light.     Comments: Chronic drooping of the left eyelid.  Cardiovascular:     Rate and Rhythm: Normal rate and regular rhythm.  Pulmonary:     Effort: No respiratory distress.     Breath sounds: No wheezing.  Abdominal:     General: Bowel sounds are normal. There is no distension.     Palpations: Abdomen is soft. There is no mass.     Tenderness:  There is no abdominal tenderness. There is no guarding or rebound.  Musculoskeletal:        General: No tenderness. Normal range of motion.     Cervical back: Normal range of motion and neck supple.  Skin:    General: Skin is warm.  Neurological:     Mental Status: He is alert and oriented to person, place, and time.  Psychiatric:        Mood and Affect: Affect normal.      LABORATORY DATA:  I have reviewed the data as listed Lab Results  Component Value Date   WBC 6.7 05/13/2020   HGB 9.2 (L) 05/13/2020   HCT 28.1 (L) 05/13/2020   MCV 101.1 (H) 05/13/2020   PLT 219 05/13/2020   Recent Labs    02/05/20 0835 02/05/20 0835 02/19/20 0813 02/19/20 0813 03/04/20 0801 03/18/20 0819 04/15/20 0818 04/29/20 0745 05/13/20 0744  NA 135   < > 135   < > 137   < > 135 137 137  K 4.3   < > 4.7   < > 4.7   < > 4.8 3.6 4.5  CL 107   < > 106   < > 107   < > 107 104 109  CO2 21*   < > 21*   < > 23   < > _0 GLUCOSE 139*   < > 101*   < > 98   < > 90 128* 126*  BUN 49*   < > 34*   < > 33*   < > 33* 44* 35*  CREATININE 2.30*   < > 2.24*   < > 2.29*   < > 1.95* 2.12* 2.20*  CALCIUM 9.0   < > 8.9   < > 8.9   < > 8.9 9.0 8.8*  GFRNONAA 25*   < > 26*   < > 25*   < > 33* 30* 29*  GFRAA 29*  --  30*  --  29*  --   --   --   --   PROT 7.1   < > 7.0   < > 7.0   < > 6.9 6.4* 6.6  ALBUMIN 4.0   < > 3.9   < > 4.0   < > 3.5 3.6 3.4*  AST 20   < > 17   < > 18   < > _1 ALT 13   < > 12    < > 13   < > _2 ALKPHOS 70   < > 70   < > 65   < > 72 59 73  BILITOT 0.6   < > 0.8   < > 0.6   < > 0.8 0.7 0.8   < > = values in this interval not displayed.    RADIOGRAPHIC STUDIES: I have personally reviewed the radiological images as listed and agreed with the findings in the report. No results found.  ASSESSMENT & PLAN:   Cancer of overlapping sites of bladder ([place]) # High-grade transitional cell carcinoma of the bladder metastatic to retroperitoneal lymph node.  Stage IV; NOV 1s, 2021-  CT-chest and pelvis-noncontrast- STABLE. No findings of recurrent malignancy; Bilateral Ground glass opacities noted [see below]  # Proceed with opdivo today. Labs today reviewed;  acceptable for treatment today.   # Incidentral [NOv 1st, 2021- ] new ground-glass opacities in both lungs primarily- resolved; will monitor.   # Iatrogenic  hypothyroidism-on Synthroid 88 mcg; STABLE TSH AUG 2021- WNL.  STABLE.  # Anemia sec to CKD/ on Iv iron.Hb-9.2- iron sats- 11% [June 2021];STABLE; s/p cysto [02/11/20];   # CKD stage IV-GFR 29- STABLE.   # DISPOSITION:  # today OPDIVO; Proceed  IV venofer.  # Follow-up in 2 weeks- -MD labs-cbc/cmp;opdivo IV; IV venofer; -Dr.B     All questions were answered. The patient knows to call the clinic with any problems, questions or concerns.    Cammie Sickle, MD 05/13/2020 9:06 AM

## 2020-05-13 NOTE — Assessment & Plan Note (Addendum)
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  Stage IV; NOV 1s, 2021-  CT-chest and pelvis-noncontrast- STABLE. No findings of recurrent malignancy; Bilateral Ground glass opacities noted [see below]  # Proceed with opdivo today. Labs today reviewed;  acceptable for treatment today.   # Incidentral [NOv 1st, 2021- ] new ground-glass opacities in both lungs primarily- resolved; will monitor.   # Iatrogenic hypothyroidism-on Synthroid 88 mcg; STABLE TSH AUG 2021- WNL.  STABLE.  # Anemia sec to CKD/ on Iv iron.Hb-9.2- iron sats- 11% [June 2021];STABLE; s/p cysto [02/11/20];   # CKD stage IV-GFR 29- STABLE.   # DISPOSITION:  # today OPDIVO; Proceed  IV venofer.  # Follow-up in 2 weeks- -MD labs-cbc/cmp;opdivo IV; IV venofer; -Dr.B

## 2020-05-13 NOTE — Progress Notes (Signed)
1048- Patient tolerated treatment well. Patient stable and discharged to home at this time.

## 2020-05-17 ENCOUNTER — Other Ambulatory Visit: Payer: Self-pay

## 2020-05-17 MED ORDER — OXYBUTYNIN CHLORIDE 5 MG PO TABS
5.0000 mg | ORAL_TABLET | Freq: Three times a day (TID) | ORAL | 0 refills | Status: DC | PRN
Start: 2020-05-17 — End: 2020-06-21

## 2020-05-24 ENCOUNTER — Other Ambulatory Visit: Payer: Self-pay

## 2020-05-24 ENCOUNTER — Ambulatory Visit (INDEPENDENT_AMBULATORY_CARE_PROVIDER_SITE_OTHER): Payer: Medicare HMO | Admitting: Internal Medicine

## 2020-05-24 ENCOUNTER — Encounter: Payer: Self-pay | Admitting: Internal Medicine

## 2020-05-24 VITALS — BP 97/50 | HR 80 | Ht 69.0 in | Wt 117.7 lb

## 2020-05-24 DIAGNOSIS — N1831 Chronic kidney disease, stage 3a: Secondary | ICD-10-CM

## 2020-05-24 DIAGNOSIS — G4486 Cervicogenic headache: Secondary | ICD-10-CM

## 2020-05-24 DIAGNOSIS — Z0000 Encounter for general adult medical examination without abnormal findings: Secondary | ICD-10-CM

## 2020-05-24 DIAGNOSIS — F419 Anxiety disorder, unspecified: Secondary | ICD-10-CM

## 2020-05-24 DIAGNOSIS — I10 Essential (primary) hypertension: Secondary | ICD-10-CM

## 2020-05-24 DIAGNOSIS — R69 Illness, unspecified: Secondary | ICD-10-CM | POA: Diagnosis not present

## 2020-05-24 DIAGNOSIS — D631 Anemia in chronic kidney disease: Secondary | ICD-10-CM

## 2020-05-24 NOTE — Assessment & Plan Note (Signed)
Patient has a history of bladder cancer with no hematuria I advised him to continue taking his iron pill.  He will follow-up with his cancer docto

## 2020-05-24 NOTE — Assessment & Plan Note (Signed)
Patient main problem today's visit is headache he is going to consult the oncologist ,in the meantime  was told the patient to take Tylenol.CT scan  May be needed.  He has a history of having radiation treatmentin the past.  His blood pressure is low, his heart is regular chest is clear abdomin soft no isntender there is no hepatosplenomegaly.  Patient says bladder cancer is much better now

## 2020-05-24 NOTE — Assessment & Plan Note (Signed)
Patient was advised to see regular kidney doctor and urologist on a regular basis

## 2020-05-24 NOTE — Assessment & Plan Note (Signed)
Blood pressure was found to be low today he was advised to drink a lot of fluid.

## 2020-05-24 NOTE — Assessment & Plan Note (Signed)
-   Patient experiencing high levels of anxiety.  - Encouraged patient to engage in relaxing activities like yoga, meditation, journaling, going for a walk, or participating in a hobby.  - Encouraged patient to reach out to trusted friends or family members about recent struggles 

## 2020-05-24 NOTE — Progress Notes (Signed)
Established Patient Office Visit  Subjective:  Patient ID: Edward Cummings, male    DOB: 04-23-1933  Age: 86 y.o. MRN: [id number]  CC:  Chief Complaint  Patient presents with  . Annual Exam    HPI  Edward Cummings presents for headache  Past Medical History:  Diagnosis Date  . Anemia   . Anxiety 12/31/2019  . Cancer ([place])    bladder  . Chronic kidney disease   . Depression   . Hypertension   . Neuromuscular disorder ([place])    Nerve damage to left face/eye since around 2002.    Past Surgical History:  Procedure Laterality Date  . CYSTOSCOPY W/ RETROGRADES Bilateral 01/25/2018   Procedure: CYSTOSCOPY WITH RETROGRADE PYELOGRAM;  [name], MD;  [name];  Service: Urology;  Laterality: Bilateral;  . CYSTOSCOPY W/ URETERAL STENT PLACEMENT Bilateral 01/06/2019   Procedure: CYSTOSCOPY WITH RETROGRADE PYELOGRAM/URETERAL STENT REMOVAL;  [name], MD;  [name];  Service: Urology;  Laterality: Bilateral;  . CYSTOSCOPY WITH STENT PLACEMENT Bilateral 01/25/2018   Procedure: CYSTOSCOPY WITH STENT PLACEMENT;  [name], MD;  [name];  Service: Urology;  Laterality: Bilateral;  . DORSAL SLIT N/A 01/06/2019   Procedure: DORSAL SLIT;  [name], MD;  [name];  Service: Urology;  [name];  . ESOPHAGOGASTRODUODENOSCOPY (EGD) WITH PROPOFOL N/A 11/12/2019   Procedure: ESOPHAGOGASTRODUODENOSCOPY (EGD) WITH PROPOFOL;  Surgeon: Virgel Manifold, MD;  Location: [facility];  Service: Endoscopy;  [name];  . EYE SURGERY     Cornea transplants bilaterally & cataract surgery.  . TRANSURETHRAL RESECTION OF BLADDER TUMOR N/A 01/25/2018   Procedure: TRANSURETHRAL RESECTION OF BLADDER TUMOR (TURBT);  [name], MD;  [name];  Service: Urology;  [name];    Family History  Problem Relation Age of Onset  . Prostate cancer Neg Hx   . Kidney cancer  Neg Hx   . Bladder Cancer Neg Hx     Social History   Socioeconomic History  . Marital status: Married    Spouse name: [name]  . Number of children: Not on file  . Years of education: Not on file  . Highest education level: Not on file  Occupational History  . Not on file  Tobacco Use  . Smoking status: Former Smoker    Types: Cigarettes  . Smokeless tobacco: Current User    Types: Chew  . Tobacco comment: Stopped approximately 10 years ago.  Vaping Use  . Vaping Use: Never used  Substance and Sexual Activity  . Alcohol use: Not Currently    Alcohol/week: 2.0 standard drinks    Types: 2 Cans of beer per week    Comment: Daily  . Drug use: Never  . Sexual activity: Yes    Birth control/protection: None  Other Topics Concern  . Not on file  Social History Narrative  . Not on file   Social Determinants of Health   Financial Resource Strain: Not on file  Food Insecurity: Not on file  Transportation Needs: Not on file  Physical Activity: Not on file  Stress: Not on file  Social Connections: Not on file  Intimate Partner Violence: Not on file     Current Outpatient Medications:  .  aspirin EC 81 MG tablet, Take 81 mg by mouth daily. , Disp: , Rfl:  .  docusate sodium (COLACE) 50 MG capsule, Take 50 mg by mouth at bedtime., Disp: ,  Rfl:  .  fexofenadine (ALLEGRA) 180 MG tablet, Take 180 mg by mouth daily. , Disp: , Rfl:  .  fluticasone (FLONASE) 50 MCG/ACT nasal spray, Place 2 sprays into both nostrils daily as needed for allergies or rhinitis., Disp: 11.1 mL, Rfl: 6 .  levothyroxine (SYNTHROID) 88 MCG tablet, Take 1 tablet (88 mcg total) by mouth daily before breakfast. Empty stomach- 1 hour prior to breakfast., Disp: 90 tablet, Rfl: 3 .  loratadine (CLARITIN) 10 MG tablet, Take 1 tablet (10 mg total) by mouth daily., Disp: 30 tablet, Rfl: 11 .  Multiple Vitamin (MULTIVITAMIN WITH MINERALS) TABS tablet, Take 1 tablet by mouth daily. , Disp: , Rfl:  .  oxybutynin  (DITROPAN) 5 MG tablet, Take 1 tablet (5 mg total) by mouth 3 (three) times daily as needed., Disp: 90 tablet, Rfl: 0 .  prednisoLONE acetate (PRED FORTE) 1 % ophthalmic suspension, Place 1 drop into both eyes daily., Disp: , Rfl:  .  tamsulosin (FLOMAX) 0.4 MG CAPS capsule, Take 1 capsule (0.4 mg total) by mouth at bedtime., Disp: 90 capsule, Rfl: 1   No Known Allergies  ROS Review of Systems  Constitutional: Negative.  Negative for chills and diaphoresis.  HENT: Negative.  Negative for sneezing.   Eyes: Negative.   Respiratory: Positive for cough.   Cardiovascular: Negative.   Gastrointestinal: Negative.   Endocrine: Negative.   Genitourinary: Negative.  Negative for flank pain.  Musculoskeletal: Negative.   Skin: Negative.   Allergic/Immunologic: Negative.   Neurological: Negative.        Headache  Hematological: Negative.   Psychiatric/Behavioral: Negative.   All other systems reviewed and are negative.     Objective:    Physical Exam  BP (!) 97/50   Pulse 80   Ht 5\' 9"  (1.753 m)   Wt 117 lb 11.2 oz (53.4 kg)   BMI 17.38 kg/m  Wt Readings from Last 3 Encounters:  05/24/20 117 lb 11.2 oz (53.4 kg)  05/13/20 123 lb (55.8 kg)  04/29/20 125 lb 5 oz (56.8 kg)     Health Maintenance Due  Topic Date Due  . COVID-19 Vaccine (1) Never done  . TETANUS/TDAP  Never done  . PNA vac Low Risk Adult (2 of 2 - PCV13) 04/09/2019  . INFLUENZA VACCINE  Never done    There are no preventive care reminders to display for this patient.  Lab Results  Component Value Date   TSH 0.416 04/15/2020   Lab Results  Component Value Date   WBC 6.7 05/13/2020   HGB 9.2 (L) 05/13/2020   HCT 28.1 (L) 05/13/2020   MCV 101.1 (H) 05/13/2020   PLT 219 05/13/2020   Lab Results  Component Value Date   NA 137 05/13/2020   K 4.5 05/13/2020   CO2 24 05/13/2020   GLUCOSE 126 (H) 05/13/2020   BUN 35 (H) 05/13/2020   CREATININE 2.20 (H) 05/13/2020   BILITOT 0.8 05/13/2020   ALKPHOS 73  05/13/2020   AST 25 05/13/2020   ALT 19 05/13/2020   PROT 6.6 05/13/2020   ALBUMIN 3.4 (L) 05/13/2020   CALCIUM 8.8 (L) 05/13/2020   ANIONGAP 4 (L) 05/13/2020   No results found for: CHOL No results found for: HDL No results found for: LDLCALC No results found for: TRIG No results found for: [facility] Lab Results  Component Value Date   HGBA1C 4.5 (L) 04/13/2018      Assessment & Plan:   Problem List Items Addressed This Visit  None     No orders of the defined types were placed in this encounter.   Follow-up: No follow-ups on file.    [name], MD

## 2020-05-24 NOTE — Assessment & Plan Note (Signed)
Use neck pillow to slee at night

## 2020-05-27 ENCOUNTER — Ambulatory Visit
Admission: RE | Admit: 2020-05-27 | Discharge: 2020-05-27 | Disposition: A | Discharge status: Home / Self Care | Payer: Medicare HMO | Source: Ambulatory Visit | Attending: Internal Medicine | Admitting: Internal Medicine

## 2020-05-27 ENCOUNTER — Inpatient Hospital Stay: Payer: Medicare HMO | Admitting: Internal Medicine

## 2020-05-27 ENCOUNTER — Inpatient Hospital Stay: Payer: Medicare HMO

## 2020-05-27 ENCOUNTER — Encounter: Payer: Self-pay | Admitting: Internal Medicine

## 2020-05-27 ENCOUNTER — Other Ambulatory Visit: Payer: Self-pay

## 2020-05-27 VITALS — BP 122/57 | HR 60 | Resp 18

## 2020-05-27 VITALS — BP 99/57 | HR 69 | Temp 98.1°F | Resp 16 | Ht 69.0 in | Wt 117.8 lb

## 2020-05-27 DIAGNOSIS — C678 Malignant neoplasm of overlapping sites of bladder: Secondary | ICD-10-CM

## 2020-05-27 DIAGNOSIS — M542 Cervicalgia: Secondary | ICD-10-CM

## 2020-05-27 DIAGNOSIS — Z5112 Encounter for antineoplastic immunotherapy: Secondary | ICD-10-CM | POA: Diagnosis not present

## 2020-05-27 DIAGNOSIS — D631 Anemia in chronic kidney disease: Secondary | ICD-10-CM

## 2020-05-27 DIAGNOSIS — Z0000 Encounter for general adult medical examination without abnormal findings: Secondary | ICD-10-CM

## 2020-05-27 DIAGNOSIS — N1831 Chronic kidney disease, stage 3a: Secondary | ICD-10-CM

## 2020-05-27 LAB — CBC WITH DIFFERENTIAL/PLATELET
Abs Immature Granulocytes: 0.02 10*3/uL (ref 0.00–0.07)
Basophils Absolute: 0 10*3/uL (ref 0.0–0.1)
Basophils Relative: 0 %
Eosinophils Absolute: 1.4 10*3/uL — ABNORMAL HIGH (ref 0.0–0.5)
Eosinophils Relative: 15 %
HCT: 27 % — ABNORMAL LOW (ref 39.0–52.0)
Hemoglobin: 8.8 g/dL — ABNORMAL LOW (ref 13.0–17.0)
Immature Granulocytes: 0 %
Lymphocytes Relative: 20 %
Lymphs Abs: 1.8 10*3/uL (ref 0.7–4.0)
MCH: 32.6 pg (ref 26.0–34.0)
MCHC: 32.6 g/dL (ref 30.0–36.0)
MCV: 100 fL (ref 80.0–100.0)
Monocytes Absolute: 0.8 10*3/uL (ref 0.1–1.0)
Monocytes Relative: 9 %
Neutro Abs: 5.1 10*3/uL (ref 1.7–7.7)
Neutrophils Relative %: 56 %
Platelets: 444 10*3/uL — ABNORMAL HIGH (ref 150–400)
RBC: 2.7 MIL/uL — ABNORMAL LOW (ref 4.22–5.81)
RDW: 12.8 % (ref 11.5–15.5)
WBC: 9.1 10*3/uL (ref 4.0–10.5)
nRBC: 0 % (ref 0.0–0.2)

## 2020-05-27 LAB — COMPREHENSIVE METABOLIC PANEL
ALT: 24 U/L (ref 0–44)
AST: 29 U/L (ref 15–41)
Albumin: 3.1 g/dL — ABNORMAL LOW (ref 3.5–5.0)
Alkaline Phosphatase: 97 U/L (ref 38–126)
Anion gap: 12 (ref 5–15)
BUN: 46 mg/dL — ABNORMAL HIGH (ref 8–23)
CO2: 16 mmol/L — ABNORMAL LOW (ref 22–32)
Calcium: 8.9 mg/dL (ref 8.9–10.3)
Chloride: 106 mmol/L (ref 98–111)
Creatinine, Ser: 2.39 mg/dL — ABNORMAL HIGH (ref 0.61–1.24)
GFR, Estimated: 26 mL/min — ABNORMAL LOW (ref >60)
Glucose, Bld: 114 mg/dL — ABNORMAL HIGH (ref 70–99)
Potassium: 4.6 mmol/L (ref 3.5–5.1)
Sodium: 134 mmol/L — ABNORMAL LOW (ref 135–145)
Total Bilirubin: 0.6 mg/dL (ref 0.3–1.2)
Total Protein: 7 g/dL (ref 6.5–8.1)

## 2020-05-27 IMAGING — CR DG CERVICAL SPINE 2 OR 3 VIEWS
1 series · 5 of 5 positions shown · non-contrast
Comparison: None.

CLINICAL DATA: Neck pain for 3 weeks without known injury.

EXAM:
CERVICAL SPINE - 2-3 VIEW

[Series 1: dg cervical spine 2 or 3 views · 0.14mm/px · 5 of 5 slices shown]
[im 1/5]
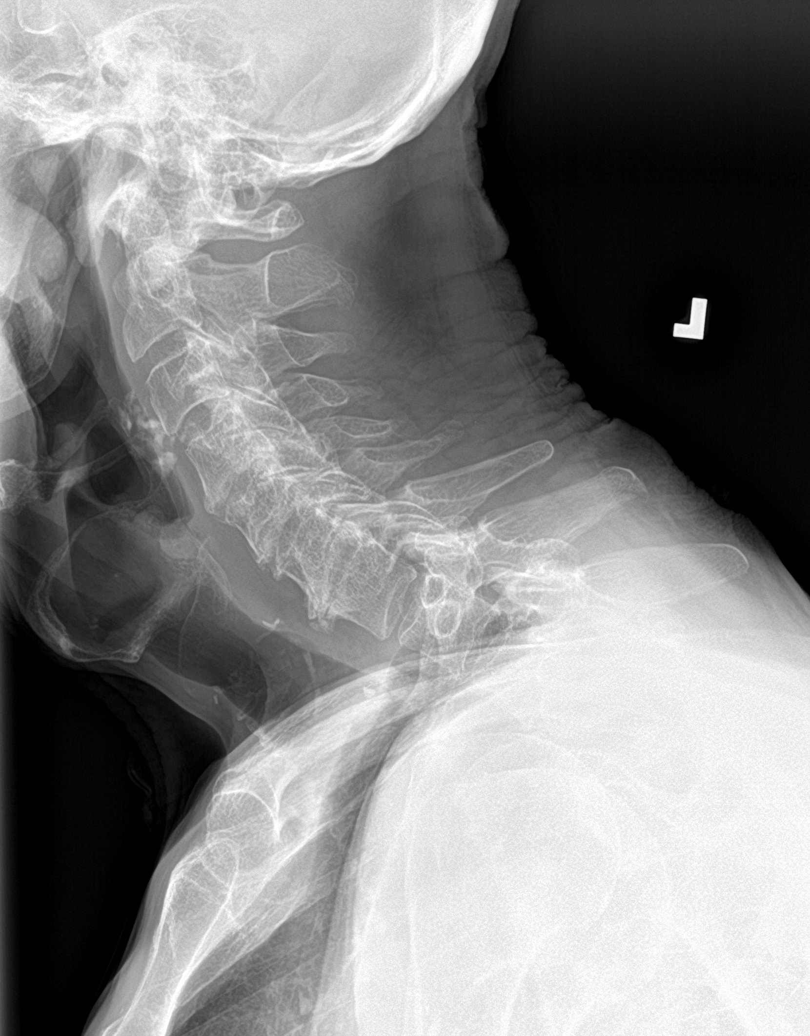
[im 2/5]
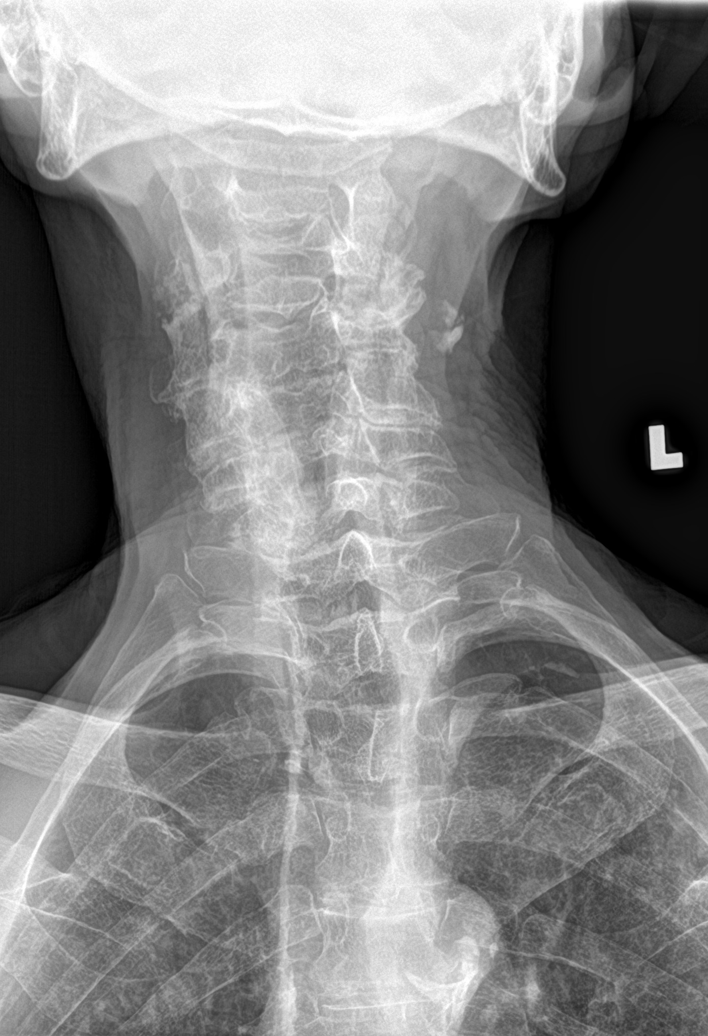
[im 3/5]
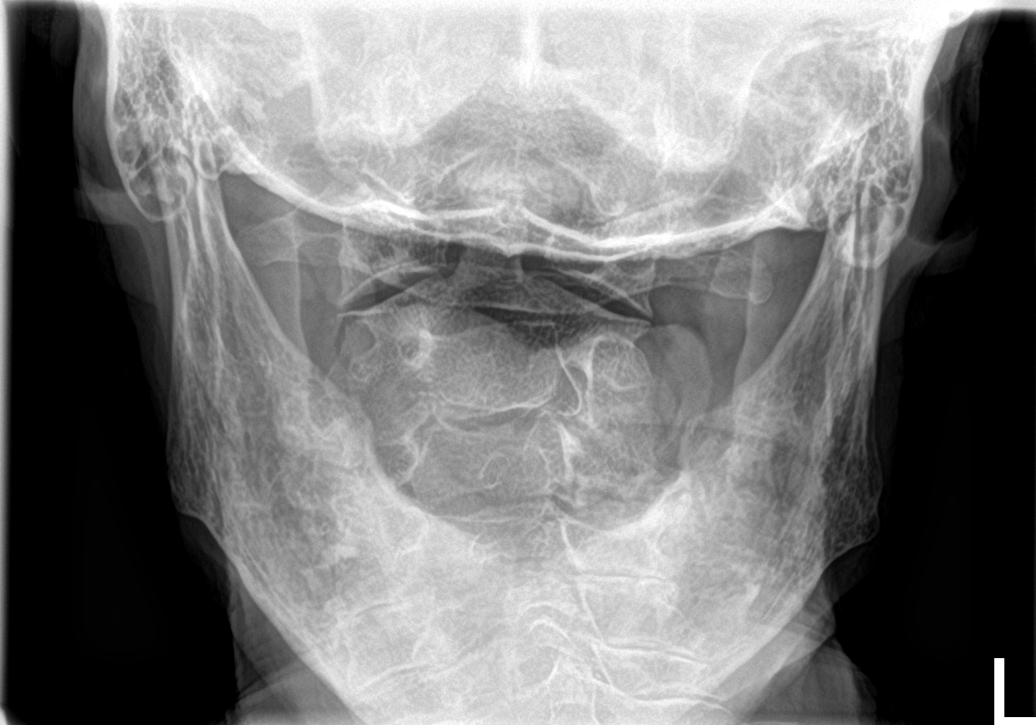
[im 4/5]
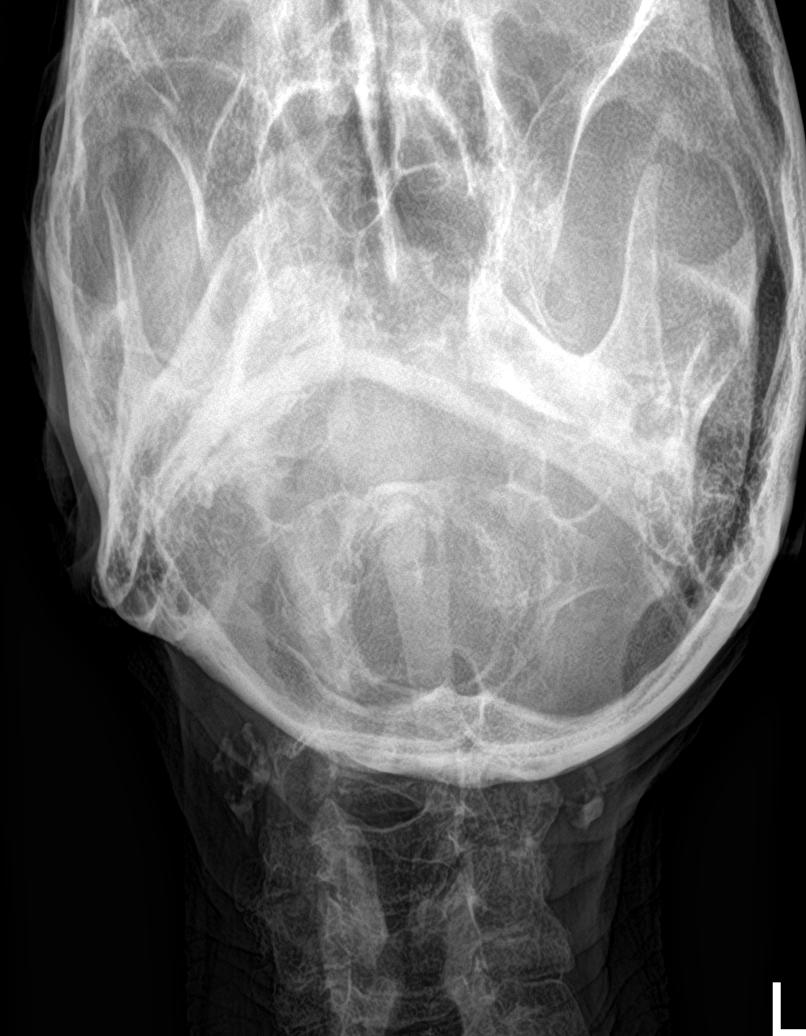
[im 5/5]
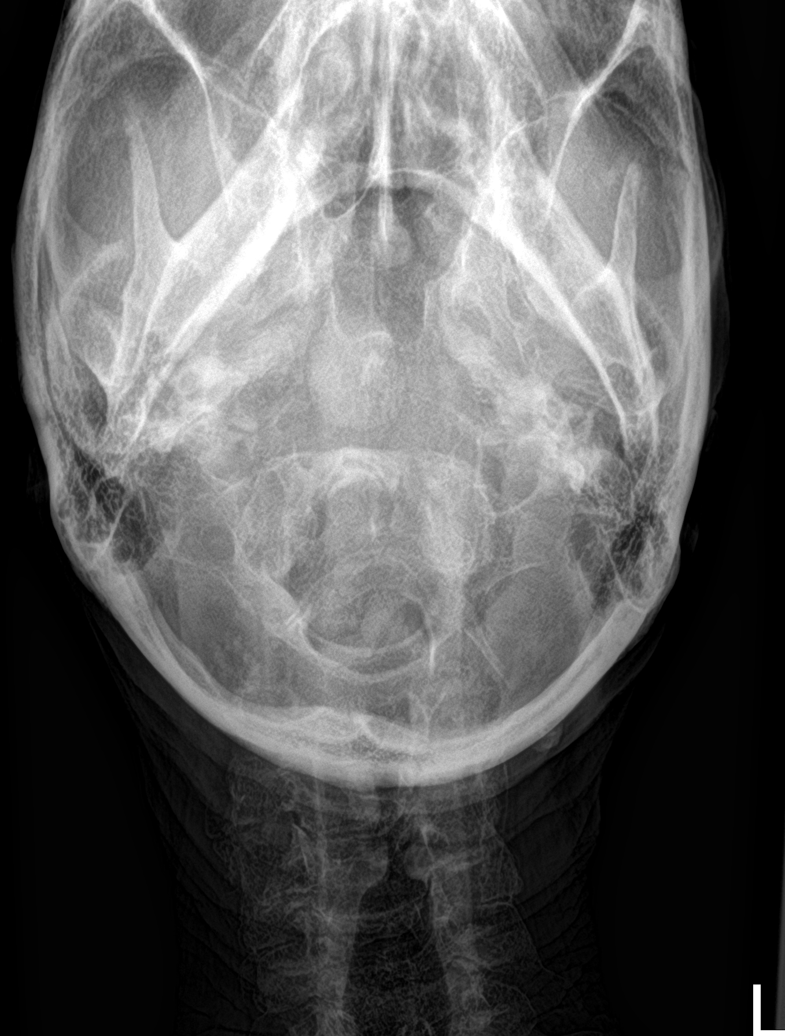

[5 of 5 positions shown; findings below may reference images not displayed]

FINDINGS: No fracture or spondylolisthesis is noted. Moderate to severe
degenerative disc disease is noted at C4-5, C5-6 and C6-7. No
prevertebral soft tissue swelling is noted. Carotid artery
calcifications are noted bilaterally in the cervical soft tissues.
IMPRESSION: 1. Moderate to severe multilevel degenerative disc disease. No acute
abnormality seen in the cervical spine.
2. Bilateral carotid artery calcifications. Carotid ultrasound is
recommended for further evaluation.

## 2020-05-27 MED ORDER — SODIUM CHLORIDE 0.9 % IV SOLN
Freq: Once | INTRAVENOUS | Status: AC
  Filled 2020-05-27: qty 250

## 2020-05-27 MED ORDER — IRON SUCROSE 20 MG/ML IV SOLN
200.0000 mg | Freq: Once | INTRAVENOUS | Status: AC
  Administered 2020-05-27: 200 mg via INTRAVENOUS
  Filled 2020-05-27: qty 10

## 2020-05-27 MED ORDER — SODIUM CHLORIDE 0.9 % IV SOLN
240.0000 mg | Freq: Once | INTRAVENOUS | Status: AC
  Administered 2020-05-27: 240 mg via INTRAVENOUS
  Filled 2020-05-27: qty 24

## 2020-05-27 NOTE — Assessment & Plan Note (Addendum)
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  Stage IV; NOV 1s, 2021-  CT-chest and pelvis-noncontrast- STABLE. No findings of recurrent malignancy; Bilateral Ground glass opacities noted [see below]  # Proceed with opdivo today. Labs today reviewed;  acceptable for treatment today.   #Neck pain question musculoskeletal.  Currently improved on NSAIDs/heating pad.  Check x-rays.  If worse would recommend further imaging.  # Incidentral [NOv 1st, 2021- ] new ground-glass opacities in both lungs primarily- resolved; will monitor.   # Iatrogenic hypothyroidism-on Synthroid 88 mcg; TSH November 2021-.  Within normal limits  # Anemia sec to CKD/ on Iv iron.Hb-8.8- iron sats- 11% [NOV 2021];STABLE; s/p cysto [02/11/20]; proceed with venofer today  # weight loss-unclear etiology; thyroid levels normal.  Recommend evaluation with [facility].  # CKD stage IV-GFR 26- STABLE.    # DISPOSITION:  # today OPDIVO; Proceed  IV venofer.   # Follow-up in 3 weeks- X- MD labs-cbc/cmp;opdivo IV; IV venofer;   #  Follow-up in 5 weeks- MD labs-cbc/cmp;opdivo IV; IV venofer; -Dr.B  Addendum: X-rays of the neck shows arthritis; no acute process.  However if worsening pain noted would recommend further imaging-neck MRI.

## 2020-05-27 NOTE — Progress Notes (Signed)
1115- Patient tolerated treatment well. Patient stable and discharged to home at this time.

## 2020-05-27 NOTE — Patient Instructions (Signed)
#   get X-rays today.

## 2020-05-27 NOTE — Progress Notes (Signed)
[facility] NOTE  Patient Care Team: [name], [name], [name], [name]; [name]; [name]; mediastinal uptake; right pubic rami uptake.  # [name]; Jan 18th 2021- switched to [place]- [pt preference; [name]; July 2020 cystoscopy-no evidence of bladder malignancy/Dr. Bernardo Heater- enlarged prostate [PSA- 0.95; 2021]  # Molecular testing- PDL-1 CPS- 20%; [name]  ;GOALS: palliative  CURRENT/MOST RECENT THERAPY:OPDIVO [C]     Cancer of overlapping sites of bladder ([place])  02/28/2018 - 06/09/2019 Chemotherapy   The patient had atezolizumab (TECENTRIQ) 1,[name], 1,200 mg, Intravenous, Once, [name],200 mg (02/28/2018), 1,200 mg (03/21/2018), 1,200 mg (04/11/2018), 1,200 mg (05/02/2018), 1,200 mg (06/13/2018), 1,200 mg (05/23/2018), 1,200 mg (07/04/2018), 1,200 mg (07/25/2018), 1,200 mg (08/15/2018), 1,200 mg (09/05/2018), 1,200 mg (10/25/2018), 1,200 mg (11/22/2018), 1,200 mg (12/20/2018), 1,200 mg (01/10/2019), 1,200 mg (01/31/2019), 1,200 mg (02/21/2019), 1,200 mg (03/14/2019), 1,200 mg (04/04/2019), 1,200 mg (04/25/2019), 1,200 mg (05/16/2019), 1,200 mg (06/09/2019)  for chemotherapy treatment.    06/30/2019 -  Chemotherapy   The patient had nivolumab  (OPDIVO) 240 mg in sodium chloride 0.9 % 100 mL chemo infusion, 240 mg, Intravenous, Once, 21 of 23 cycles Administration: 240 mg (06/30/2019), 240 mg (07/14/2019), 240 mg (07/28/2019), 240 mg (08/11/2019), 240 mg (08/25/2019), 240 mg (09/08/2019), 240 mg (09/22/2019), 240 mg (11/03/2019), 240 mg (11/17/2019), 240 mg (12/10/2019), 240 mg (12/24/2019), 240 mg (01/08/2020), 240 mg (01/22/2020), 240 mg (02/05/2020), 240 mg (02/19/2020), 240 mg (03/04/2020), 240 mg (03/18/2020), 240 mg (04/01/2020), 240 mg (04/29/2020), 240 mg (05/13/2020), 240 mg (05/27/2020)  for chemotherapy treatment.     HISTORY OF PRESENTING ILLNESS: Edward Cummings 86 y.o.  male with metastatic transitional carcinoma of the bladder currently on Opdivo is here for follow-up.  Patient notes to have pain in his neck over the last 2 weeks.  He has been using heating pad.  Also been taking Tylenol.  Currently improved.  Otherwise no nausea no vomiting but no diarrhea.  Possible weight loss.  No blood in stools or black or stools.  Review of Systems  Constitutional: Positive for malaise/fatigue. Negative for chills, diaphoresis and fever.  HENT: Negative for nosebleeds and sore throat.   Eyes: Negative for double vision.  Respiratory: Negative for hemoptysis, sputum production and wheezing.   Cardiovascular: Negative for chest pain, palpitations, orthopnea and leg swelling.  Gastrointestinal: Negative for abdominal pain, blood in stool, diarrhea, heartburn, melena, nausea and vomiting.  Genitourinary: Positive for frequency and urgency. Negative for dysuria.  Musculoskeletal: Positive for back pain. Negative for joint pain.  Skin: Negative.  Negative for itching and rash.  Neurological: Negative for tingling, focal weakness, weakness and headaches.  Endo/Heme/Allergies: Does not bruise/bleed easily.  Psychiatric/Behavioral: Negative for depression. The patient is not nervous/anxious and does not have insomnia.      MEDICAL HISTORY:  Past Medical  History:  Diagnosis Date  . Anemia   . Anxiety 12/31/2019  . Cancer ([place])    bladder  . Chronic kidney disease   . Depression   . Hypertension   . Neuromuscular disorder ([place])    Nerve damage to left face/eye since around 2002.    SURGICAL HISTORY: Past Surgical History:  Procedure Laterality Date  . CYSTOSCOPY W/ RETROGRADES Bilateral 01/25/2018   Procedure: CYSTOSCOPY WITH RETROGRADE PYELOGRAM;  [name], MD;  [name];  Service: Urology;  Laterality: Bilateral;  . CYSTOSCOPY W/ URETERAL STENT PLACEMENT Bilateral 01/06/2019   Procedure: CYSTOSCOPY WITH RETROGRADE PYELOGRAM/URETERAL STENT REMOVAL;  [name], MD;  [name];  Service: Urology;  Laterality: Bilateral;  . CYSTOSCOPY WITH STENT PLACEMENT Bilateral 01/25/2018   Procedure: CYSTOSCOPY WITH STENT PLACEMENT;  [name], MD;  [name];  Service: Urology;  Laterality: Bilateral;  . DORSAL SLIT N/A 01/06/2019   Procedure: DORSAL SLIT;  [name], MD;  [name];  Service: Urology;  [name];  . ESOPHAGOGASTRODUODENOSCOPY (EGD) WITH PROPOFOL N/A 11/12/2019   Procedure: ESOPHAGOGASTRODUODENOSCOPY (EGD) WITH PROPOFOL;  Surgeon: Virgel Manifold, MD;  Location: [facility];  Service: Endoscopy;  [name];  . EYE SURGERY     Cornea transplants bilaterally & cataract surgery.  . TRANSURETHRAL RESECTION OF BLADDER TUMOR N/A 01/25/2018   Procedure: TRANSURETHRAL RESECTION OF BLADDER TUMOR (TURBT);  [name], MD;  [name];  Service: Urology;  [name];    SOCIAL HISTORY: lives in [place]; with wife; quit smoking 18 years ago; beer every 2 months or so.mechanic/retd.   Social History   Socioeconomic History  . Marital status: Married    Spouse name: [name]  . Number of children: Not on file  . Years of education: Not on file  . Highest education level: Not on file   Occupational History  . Not on file  Tobacco Use  . Smoking status: Former Smoker    Types: Cigarettes  . Smokeless tobacco: Current User    Types: Chew  . Tobacco comment: Stopped approximately 10 years ago.  Vaping Use  . Vaping Use: Never used  Substance and Sexual Activity  . Alcohol use: Not Currently    Alcohol/week: 2.0 standard drinks    Types: 2 Cans of beer per week    Comment: Daily  . Drug use: Never  . Sexual activity: Yes    Birth control/protection: None  Other Topics Concern  . Not on file  Social History Narrative  . Not on file   Social Determinants of Health   Financial Resource Strain: Not on file  Food Insecurity: Not on file  Transportation Needs: Not on file  Physical Activity: Not on file  Stress: Not on file  Social Connections: Not on file  Intimate Partner Violence: Not on file    FAMILY HISTORY: Family History  Problem Relation Age of Onset  . Prostate cancer Neg Hx   . Kidney cancer Neg Hx   . Bladder Cancer Neg Hx     ALLERGIES:  has No Known Allergies.  MEDICATIONS:  Current Outpatient Medications  Medication Sig Dispense Refill  . aspirin EC 81 MG tablet Take 81 mg by mouth daily.     Marland Kitchen docusate sodium (COLACE) 50 MG capsule Take 50 mg by mouth at bedtime.    . fexofenadine (ALLEGRA) 180 MG tablet Take 180 mg by mouth daily.     . fluticasone (  FLONASE) 50 MCG/ACT nasal spray Place 2 sprays into both nostrils daily as needed for allergies or rhinitis. 11.1 mL 6  . levothyroxine (SYNTHROID) 88 MCG tablet Take 1 tablet (88 mcg total) by mouth daily before breakfast. Empty stomach- 1 hour prior to breakfast. 90 tablet 3  . loratadine (CLARITIN) 10 MG tablet Take 1 tablet (10 mg total) by mouth daily. 30 tablet 11  . Multiple Vitamin (MULTIVITAMIN WITH MINERALS) TABS tablet Take 1 tablet by mouth daily.     Marland Kitchen oxybutynin (DITROPAN) 5 MG tablet Take 1 tablet (5 mg total) by mouth 3 (three) times daily as needed. 90 tablet 0  .  prednisoLONE acetate (PRED FORTE) 1 % ophthalmic suspension Place 1 drop into both eyes daily.    . tamsulosin (FLOMAX) 0.4 MG CAPS capsule Take 1 capsule (0.4 mg total) by mouth at bedtime. 90 capsule 1   No current facility-administered medications for this visit.      Marland Kitchen  PHYSICAL EXAMINATION: ECOG PERFORMANCE STATUS: 1 - Symptomatic but completely ambulatory  Vitals:   05/27/20 0908  BP: (!) 99/57  Pulse: 69  Resp: 16  Temp: 98.1 F (36.7 C)  SpO2: 95%   Filed Weights   05/27/20 0908  Weight: 117 lb 12.8 oz (53.4 kg)    Physical Exam Constitutional:      Comments: Walking himself.  Thin built moderately nourished male patient.  HENT:     Head: Normocephalic and atraumatic.     Mouth/Throat:     Pharynx: No oropharyngeal exudate.  Eyes:     Pupils: Pupils are equal, round, and reactive to light.     Comments: Chronic drooping of the left eyelid.  Cardiovascular:     Rate and Rhythm: Normal rate and regular rhythm.  Pulmonary:     Effort: No respiratory distress.     Breath sounds: No wheezing.  Abdominal:     General: Bowel sounds are normal. There is no distension.     Palpations: Abdomen is soft. There is no mass.     Tenderness: There is no abdominal tenderness. There is no guarding or rebound.  Musculoskeletal:        General: No tenderness. Normal range of motion.     Cervical back: Normal range of motion and neck supple.  Skin:    General: Skin is warm.  Neurological:     Mental Status: He is alert and oriented to person, place, and time.  Psychiatric:        Mood and Affect: Affect normal.      LABORATORY DATA:  I have reviewed the data as listed Lab Results  Component Value Date   WBC 9.1 05/27/2020   HGB 8.8 (L) 05/27/2020   HCT 27.0 (L) 05/27/2020   MCV 100.0 05/27/2020   PLT 444 (H) 05/27/2020   Recent Labs    02/05/20 0835 02/19/20 0813 03/04/20 0801 03/18/20 0819 04/29/20 0745 05/13/20 0744 05/27/20 0836  NA 135 135 137   < >  137 137 134*  K 4.3 4.7 4.7   < > 3.6 4.5 4.6  CL 107 106 107   < > 104 109 106  CO2 21* 21* 23   < > 24 24 16*  GLUCOSE 139* 101* 98   < > 128* 126* 114*  BUN 49* 34* 33*   < > 44* 35* 46*  CREATININE 2.30* 2.24* 2.29*   < > 2.12* 2.20* 2.39*  CALCIUM 9.0 8.9 8.9   < > 9.0 8.8*  8.9  GFRNONAA 25* 26* 25*   < > 30* 29* 26*  GFRAA 29* 30* 29*  --   --   --   --   PROT 7.1 7.0 7.0   < > 6.4* 6.6 7.0  ALBUMIN 4.0 3.9 4.0   < > 3.6 3.4* 3.1*  AST '20 17 18   ' < > '23 25 29  ' ALT '13 12 13   ' < > '28 19 24  ' ALKPHOS 70 70 65   < > 59 73 97  BILITOT 0.6 0.8 0.6   < > 0.7 0.8 0.6   < > = values in this interval not displayed.    RADIOGRAPHIC STUDIES: I have personally reviewed the radiological images as listed and agreed with the findings in the report. DG Cervical Spine 2 or 3 views  Result Date: 05/27/2020 CLINICAL DATA:  Neck pain for 3 weeks without known injury. EXAM: CERVICAL SPINE - 2-3 VIEW COMPARISON:  None. FINDINGS: No fracture or spondylolisthesis is noted. Moderate to severe degenerative disc disease is noted at C4-5, C5-6 and C6-7. No prevertebral soft tissue swelling is noted. Carotid artery calcifications are noted bilaterally in the cervical soft tissues. IMPRESSION: 1. Moderate to severe multilevel degenerative disc disease. No acute abnormality seen in the cervical spine. 2. Bilateral carotid artery calcifications. Carotid ultrasound is recommended for further evaluation. Electronically Signed   By: Marijo Conception M.D.   On: 05/27/2020 16:50    ASSESSMENT & PLAN:   Cancer of overlapping sites of bladder ([place]) # High-grade transitional cell carcinoma of the bladder metastatic to retroperitoneal lymph node.  Stage IV; NOV 1s, 2021-  CT-chest and pelvis-noncontrast- STABLE. No findings of recurrent malignancy; Bilateral Ground glass opacities noted [see below]  # Proceed with opdivo today. Labs today reviewed;  acceptable for treatment today.   #Neck pain question musculoskeletal.   Currently improved on NSAIDs/heating pad.  Check x-rays.  If worse would recommend further imaging.  # Incidentral [NOv 1st, 2021- ] new ground-glass opacities in both lungs primarily- resolved; will monitor.   # Iatrogenic hypothyroidism-on Synthroid 88 mcg; TSH November 2021-.  Within normal limits  # Anemia sec to CKD/ on Iv iron.Hb-8.8- iron sats- 11% [NOV 2021];STABLE; s/p cysto [02/11/20]; proceed with venofer today  # weight loss-unclear etiology; thyroid levels normal.  Recommend evaluation with [facility].  # CKD stage IV-GFR 26- STABLE.    # DISPOSITION:  # today OPDIVO; Proceed  IV venofer.   # Follow-up in 3 weeks- X- MD labs-cbc/cmp;opdivo IV; IV venofer;   #  Follow-up in 5 weeks- MD labs-cbc/cmp;opdivo IV; IV venofer; -Dr.B  Addendum: X-rays of the neck shows arthritis; no acute process.  However if worsening pain noted would recommend further imaging-neck MRI.     All questions were answered. The patient knows to call the clinic with any problems, questions or concerns.    Cammie Sickle, MD 05/30/2020 9:39 PM

## 2020-06-01 ENCOUNTER — Telehealth: Payer: Self-pay | Admitting: Internal Medicine

## 2020-06-01 DIAGNOSIS — I6523 Occlusion and stenosis of bilateral carotid arteries: Secondary | ICD-10-CM

## 2020-06-01 NOTE — Telephone Encounter (Signed)
On 12/21-I spoke with patient's son regarding the results of the neck x-ray negative for any acute process. Neck pain likely from arthritis. Given these calcifications noted in the arteries-recommend carotid Dopplers.  However as per patient's son patient has not been active; resting in the chair all the time. Not sure if is depressed [lost his wife recently].  #C-please schedule carotid Dopplers.  #Also schedule  appt with Merrily Pew- next week- when Merrily Pew is available re: depression/weight loss.

## 2020-06-02 NOTE — Progress Notes (Signed)
Established Patient Office Visit  Subjective:  Patient ID: Edward Cummings, male    DOB: September 07, 1932  Age: 86 y.o. MRN: [id number]  CC:  Chief Complaint  Patient presents with  . Annual Exam    HPI  Edward Cummings presents for headache  Past Medical History:  Diagnosis Date  . Anemia   . Anxiety 12/31/2019  . Cancer ([place])    bladder  . Chronic kidney disease   . Depression   . Hypertension   . Neuromuscular disorder ([place])    Nerve damage to left face/eye since around 2002.    Past Surgical History:  Procedure Laterality Date  . CYSTOSCOPY W/ RETROGRADES Bilateral 01/25/2018   Procedure: CYSTOSCOPY WITH RETROGRADE PYELOGRAM;  [name], MD;  [name];  Service: Urology;  Laterality: Bilateral;  . CYSTOSCOPY W/ URETERAL STENT PLACEMENT Bilateral 01/06/2019   Procedure: CYSTOSCOPY WITH RETROGRADE PYELOGRAM/URETERAL STENT REMOVAL;  [name], MD;  [name];  Service: Urology;  Laterality: Bilateral;  . CYSTOSCOPY WITH STENT PLACEMENT Bilateral 01/25/2018   Procedure: CYSTOSCOPY WITH STENT PLACEMENT;  [name], MD;  [name];  Service: Urology;  Laterality: Bilateral;  . DORSAL SLIT N/A 01/06/2019   Procedure: DORSAL SLIT;  [name], MD;  [name];  Service: Urology;  [name];  . ESOPHAGOGASTRODUODENOSCOPY (EGD) WITH PROPOFOL N/A 11/12/2019   Procedure: ESOPHAGOGASTRODUODENOSCOPY (EGD) WITH PROPOFOL;  Surgeon: Virgel Manifold, MD;  Location: [facility];  Service: Endoscopy;  [name];  . EYE SURGERY     Cornea transplants bilaterally & cataract surgery.  . TRANSURETHRAL RESECTION OF BLADDER TUMOR N/A 01/25/2018   Procedure: TRANSURETHRAL RESECTION OF BLADDER TUMOR (TURBT);  [name], MD;  [name];  Service: Urology;  [name];    Family History  Problem Relation Age of Onset  . Prostate cancer Neg Hx   . Kidney cancer  Neg Hx   . Bladder Cancer Neg Hx     Social History   Socioeconomic History  . Marital status: Married    Spouse name: [name]  . Number of children: Not on file  . Years of education: Not on file  . Highest education level: Not on file  Occupational History  . Not on file  Tobacco Use  . Smoking status: Former Smoker    Types: Cigarettes  . Smokeless tobacco: Current User    Types: Chew  . Tobacco comment: Stopped approximately 10 years ago.  Vaping Use  . Vaping Use: Never used  Substance and Sexual Activity  . Alcohol use: Not Currently    Alcohol/week: 2.0 standard drinks    Types: 2 Cans of beer per week    Comment: Daily  . Drug use: Never  . Sexual activity: Yes    Birth control/protection: None  Other Topics Concern  . Not on file  Social History Narrative  . Not on file   Social Determinants of Health   Financial Resource Strain: Not on file  Food Insecurity: Not on file  Transportation Needs: Not on file  Physical Activity: Not on file  Stress: Not on file  Social Connections: Not on file  Intimate Partner Violence: Not on file     Current Outpatient Medications:  .  aspirin EC 81 MG tablet, Take 81 mg by mouth daily. , Disp: , Rfl:  .  docusate sodium (COLACE) 50 MG capsule, Take 50 mg by mouth at bedtime., Disp: ,  Rfl:  .  fexofenadine (ALLEGRA) 180 MG tablet, Take 180 mg by mouth daily. , Disp: , Rfl:  .  fluticasone (FLONASE) 50 MCG/ACT nasal spray, Place 2 sprays into both nostrils daily as needed for allergies or rhinitis., Disp: 11.1 mL, Rfl: 6 .  levothyroxine (SYNTHROID) 88 MCG tablet, Take 1 tablet (88 mcg total) by mouth daily before breakfast. Empty stomach- 1 hour prior to breakfast., Disp: 90 tablet, Rfl: 3 .  loratadine (CLARITIN) 10 MG tablet, Take 1 tablet (10 mg total) by mouth daily., Disp: 30 tablet, Rfl: 11 .  Multiple Vitamin (MULTIVITAMIN WITH MINERALS) TABS tablet, Take 1 tablet by mouth daily. , Disp: , Rfl:  .  oxybutynin  (DITROPAN) 5 MG tablet, Take 1 tablet (5 mg total) by mouth 3 (three) times daily as needed., Disp: 90 tablet, Rfl: 0 .  prednisoLONE acetate (PRED FORTE) 1 % ophthalmic suspension, Place 1 drop into both eyes daily., Disp: , Rfl:  .  tamsulosin (FLOMAX) 0.4 MG CAPS capsule, Take 1 capsule (0.4 mg total) by mouth at bedtime., Disp: 90 capsule, Rfl: 1   No Known Allergies  ROS Review of Systems  Constitutional: Negative.   HENT: Negative.        Headache  Eyes: Negative.  Negative for redness.  Respiratory: Negative.  Negative for chest tightness.   Cardiovascular: Negative.  Negative for leg swelling.  Gastrointestinal: Negative.  Negative for anal bleeding, blood in stool and constipation.  Endocrine: Negative.  Negative for heat intolerance.  Genitourinary: Negative.  Negative for difficulty urinating.  Musculoskeletal: Negative.  Negative for back pain.  Skin: Negative.   Allergic/Immunologic: Negative.   Neurological: Negative.  Negative for light-headedness.  Hematological: Negative.   Psychiatric/Behavioral: Negative.  Negative for agitation.  All other systems reviewed and are negative.     Objective:    Physical Exam Vitals reviewed.  Constitutional:      Appearance: Normal appearance.  HENT:     Mouth/Throat:     Mouth: Mucous membranes are moist.  Eyes:     Pupils: Pupils are equal, round, and reactive to light.  Neck:     Vascular: No carotid bruit.  Cardiovascular:     Rate and Rhythm: Normal rate and regular rhythm.     Pulses: Normal pulses.     Heart sounds: Normal heart sounds.  Pulmonary:     Effort: Pulmonary effort is normal.     Breath sounds: Normal breath sounds.  Abdominal:     General: Bowel sounds are normal.     Palpations: Abdomen is soft. There is no hepatomegaly, splenomegaly or mass.     Tenderness: There is no abdominal tenderness.     Hernia: No hernia is present.  Musculoskeletal:     Cervical back: Neck supple.     Right lower  leg: No edema.     Left lower leg: No edema.  Skin:    Findings: No rash.  Neurological:     Mental Status: He is alert and oriented to person, place, and time.     Motor: No weakness.  Psychiatric:        Mood and Affect: Mood normal.        Behavior: Behavior normal.     BP (!) 97/50   Pulse 80   Ht 5\' 9"  (1.753 m)   Wt 117 lb 11.2 oz (53.4 kg)   BMI 17.38 kg/m  Wt Readings from Last 3 Encounters:  05/27/20 117 lb 12.8 oz (53.4 kg)  05/24/20 117 lb 11.2 oz (53.4 kg)  05/13/20 123 lb (55.8 kg)     Health Maintenance Due  Topic Date Due  . COVID-19 Vaccine (1) Never done  . TETANUS/TDAP  Never done  . PNA vac Low Risk Adult (2 of 2 - PCV13) 04/09/2019  . INFLUENZA VACCINE  Never done    There are no preventive care reminders to display for this patient.  Lab Results  Component Value Date   TSH 0.416 04/15/2020   Lab Results  Component Value Date   WBC 9.1 05/27/2020   HGB 8.8 (L) 05/27/2020   HCT 27.0 (L) 05/27/2020   MCV 100.0 05/27/2020   PLT 444 (H) 05/27/2020   Lab Results  Component Value Date   NA 134 (L) 05/27/2020   K 4.6 05/27/2020   CO2 16 (L) 05/27/2020   GLUCOSE 114 (H) 05/27/2020   BUN 46 (H) 05/27/2020   CREATININE 2.39 (H) 05/27/2020   BILITOT 0.6 05/27/2020   ALKPHOS 97 05/27/2020   AST 29 05/27/2020   ALT 24 05/27/2020   PROT 7.0 05/27/2020   ALBUMIN 3.1 (L) 05/27/2020   CALCIUM 8.9 05/27/2020   ANIONGAP 12 05/27/2020   No results found for: CHOL No results found for: HDL No results found for: LDLCALC No results found for: TRIG No results found for: CHOLHDL Lab Results  Component Value Date   HGBA1C 4.5 (L) 04/13/2018      Assessment & Plan:   Problem List Items Addressed This Visit      Cardiovascular and Mediastinum   Hypertension    Blood pressure was found to be low today he was advised to drink a lot of fluid.        Genitourinary   Anemia due to stage 3 chronic kidney disease ([place])    Patient has a history  of bladder cancer with no hematuria I advised him to continue taking his iron pill.  He will follow-up with his cancer docto      Stage 3a chronic kidney disease ([place])    Patient was advised to see regular kidney doctor and urologist on a regular basis        Other   Annual physical exam    Patient main problem today's visit is headache he is going to consult the oncologist ,in the meantime  was told the patient to take Tylenol.CT scan  May be needed.  He has a history of having radiation treatmentin the past.  His blood pressure is low, his heart is regular chest is clear abdomin soft no isntender there is no hepatosplenomegaly.  Patient says bladder cancer is much better now      Anxiety    - Patient experiencing high levels of anxiety.  - Encouraged patient to engage in relaxing activities like yoga, meditation, journaling, going for a walk, or participating in a hobby.  - Encouraged patient to reach out to trusted friends or family members about recent struggles       Cervicogenic headache    Use neck pillow to slee at night       Other Visit Diagnoses    Essential hypertension    -  Primary      No orders of the defined types were placed in this encounter.   Follow-up: No follow-ups on file.    [name], MD

## 2020-06-08 ENCOUNTER — Other Ambulatory Visit: Payer: Self-pay

## 2020-06-08 ENCOUNTER — Ambulatory Visit
Admission: RE | Admit: 2020-06-08 | Discharge: 2020-06-08 | Disposition: A | Discharge status: Home / Self Care | Payer: Medicare HMO | Source: Ambulatory Visit | Attending: Internal Medicine | Admitting: Internal Medicine

## 2020-06-08 DIAGNOSIS — I6523 Occlusion and stenosis of bilateral carotid arteries: Secondary | ICD-10-CM | POA: Diagnosis not present

## 2020-06-08 IMAGING — US US CAROTID DUPLEX BILAT
1 series · 13 of 24 positions shown · non-contrast
Comparison: [DATE]

CLINICAL DATA: 87-year-old male history of bilateral carotid artery
calcifications.

EXAM:
BILATERAL CAROTID DUPLEX ULTRASOUND
TECHNIQUE: Gray scale imaging, color Doppler and duplex ultrasound were
performed of bilateral carotid and vertebral arteries in the neck.

[Series 1: us carotid bilateral · 13 of 65 slices shown]
[im 1/65]
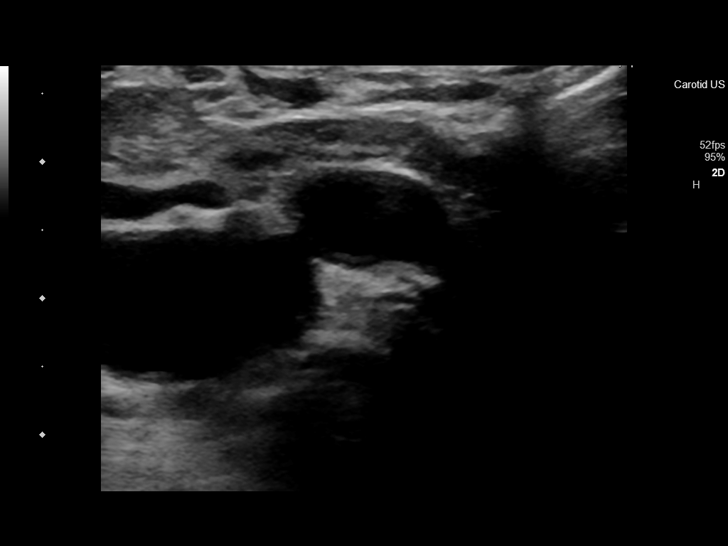
[im 6/65]
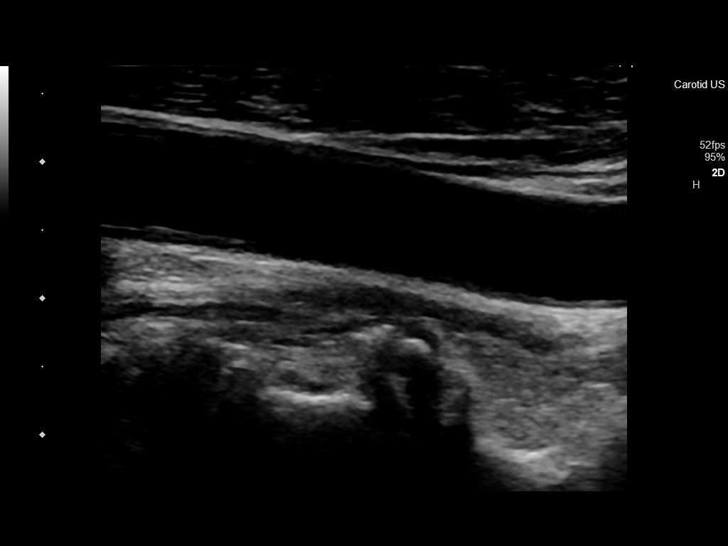
[im 12/65]
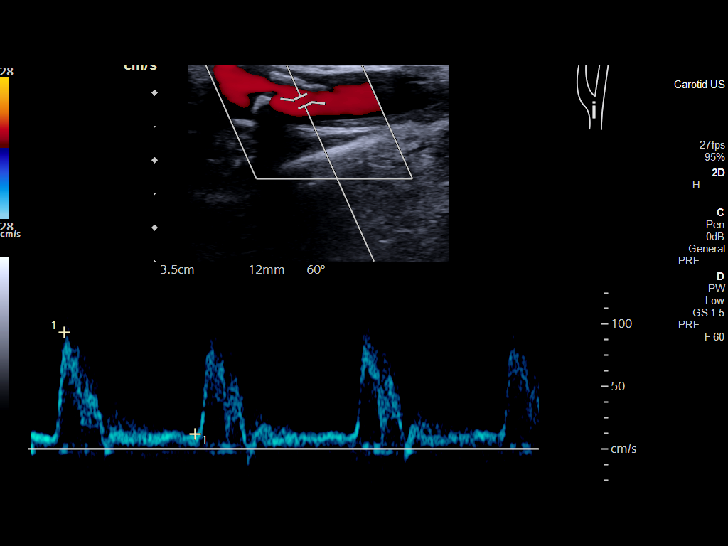
[im 17/65]
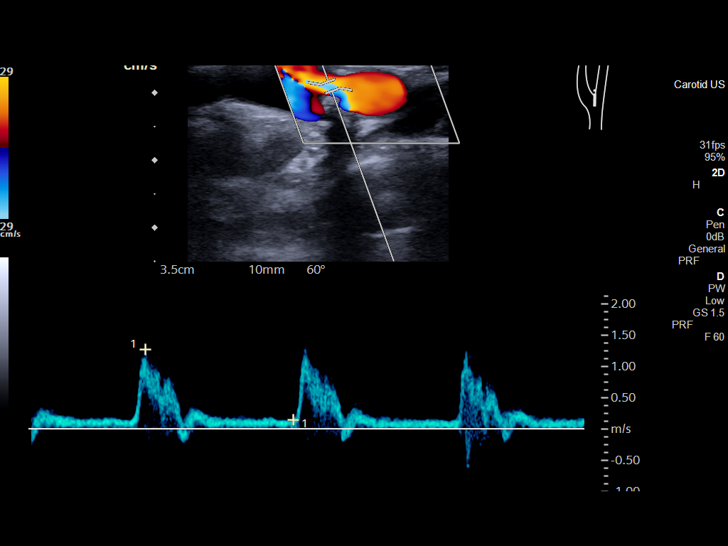
[im 23/65]
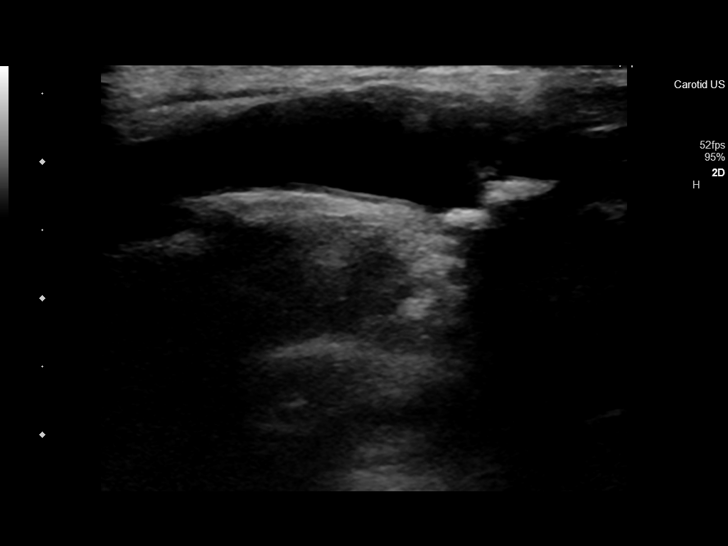
[im 28/65]
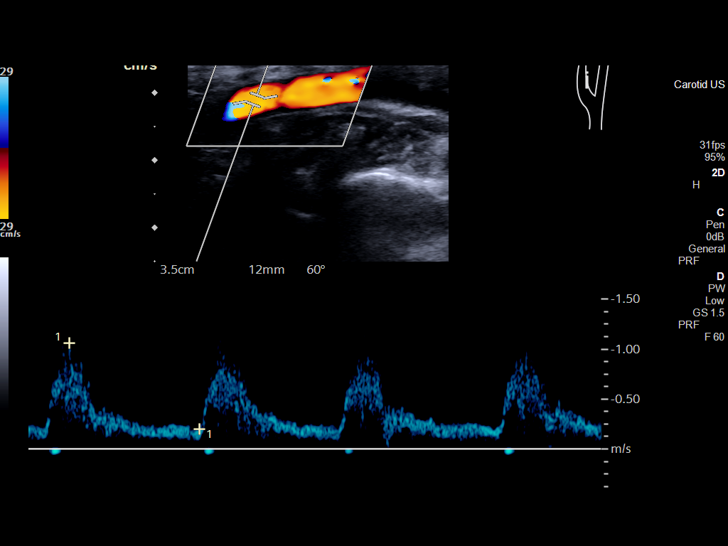
[im 34/65]
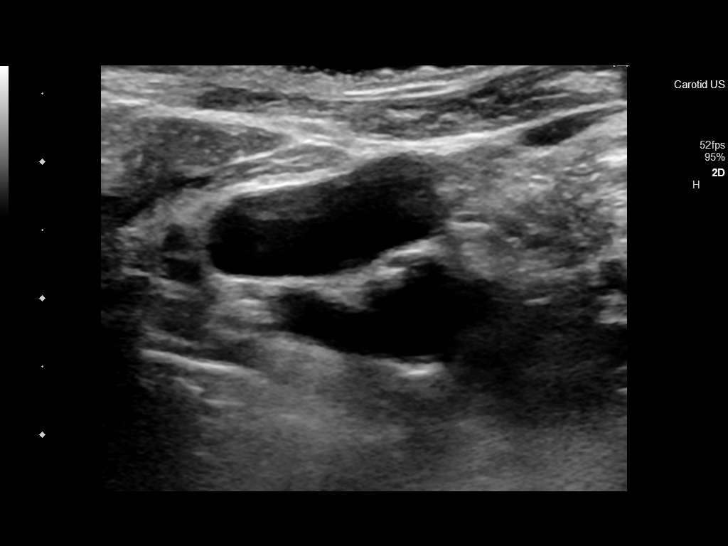
[im 37/65]
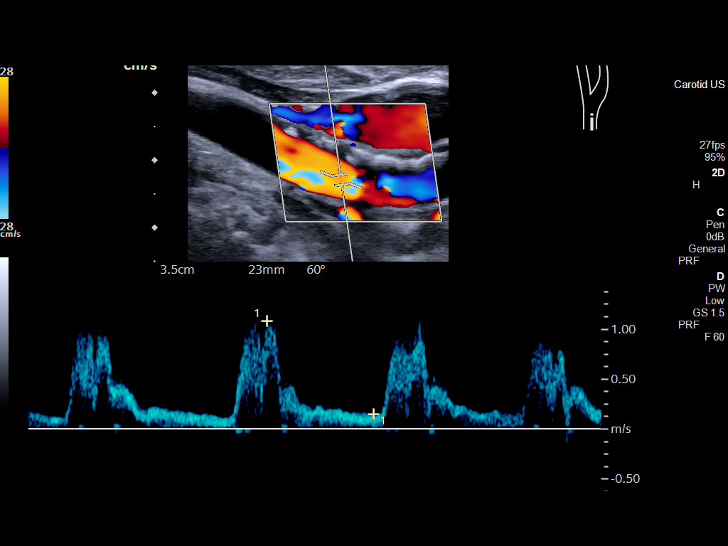
[im 42/65]
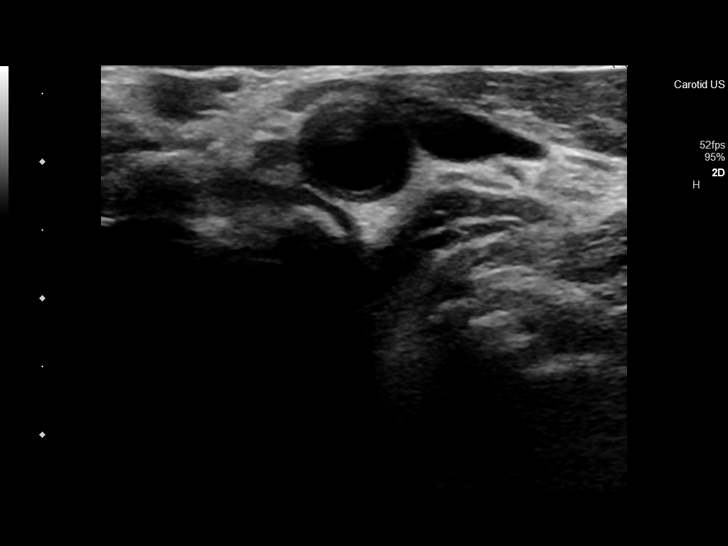
[im 48/65]
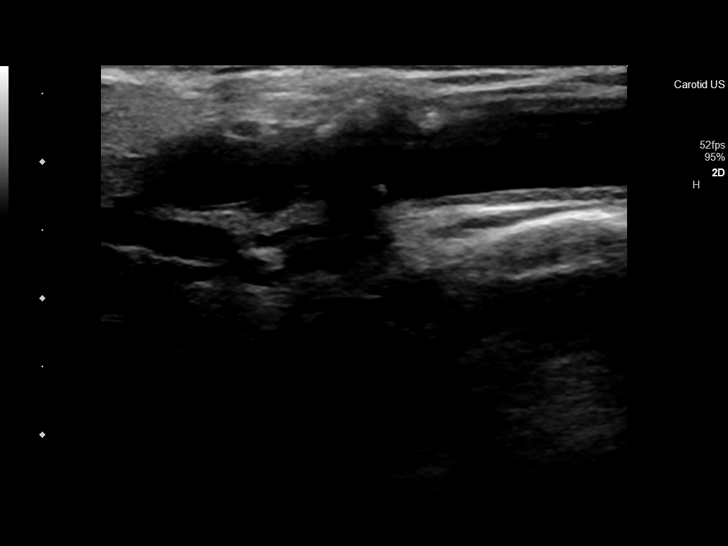
[im 53/65]
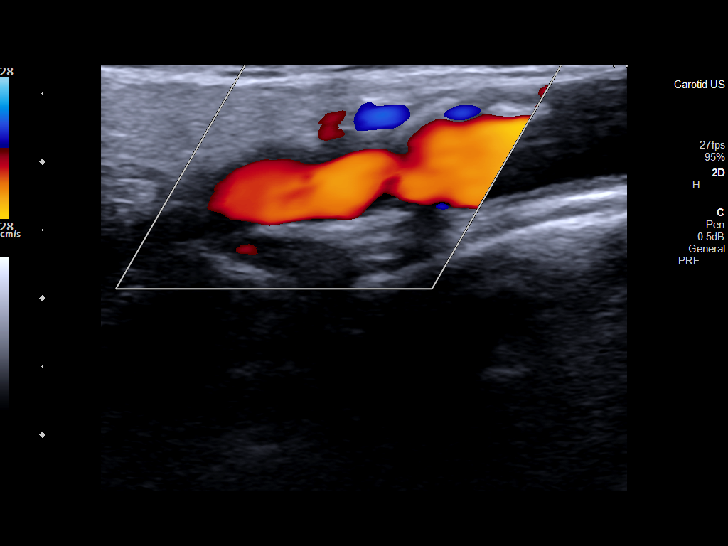
[im 59/65]
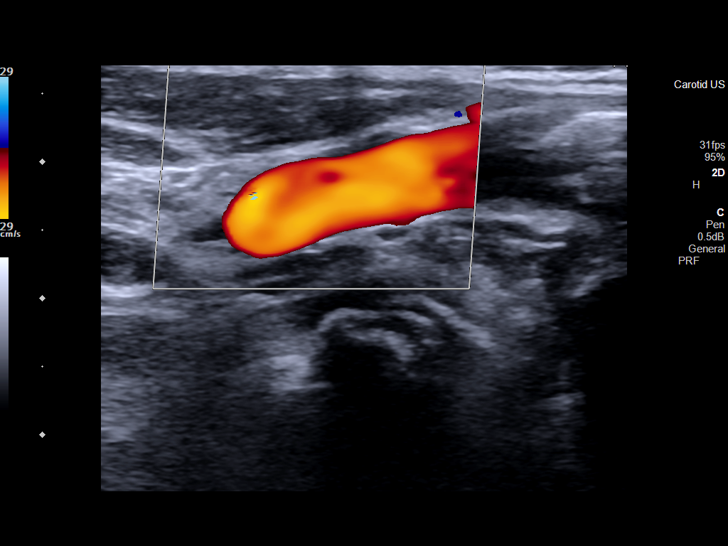
[im 65/65]
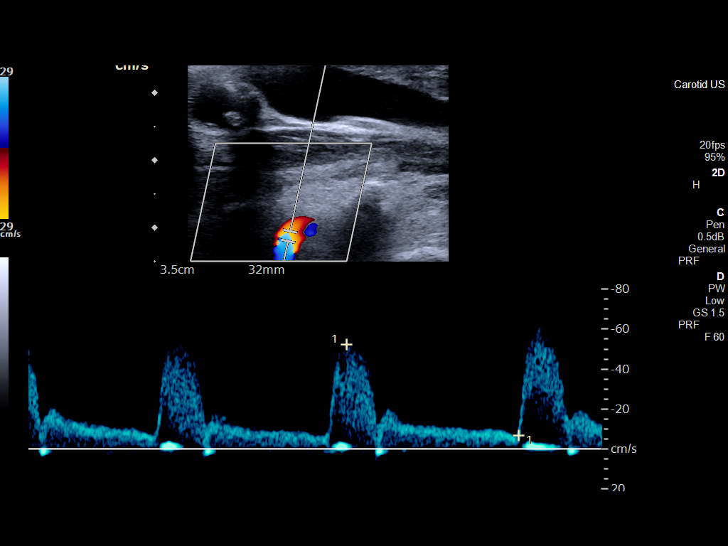

[13 of 24 positions shown; findings below may reference images not displayed]

FINDINGS: Criteria: Quantification of carotid stenosis is based on velocity
parameters that correlate the residual internal carotid diameter
with NASCET-based stenosis levels, using the diameter of the distal
internal carotid lumen as the denominator for stenosis measurement.

The following velocity measurements were obtained:

RIGHT

ICA: Peak systolic velocity 106 cm/sec, End diastolic velocity 20
cm/sec

CCA: Peak systolic velocity 99 cm/sec

SYSTOLIC ICA/CCA RATIO:

ECA: Peak systolic velocity 229 cm/sec

LEFT

ICA: Peak systolic velocity 100 cm/sec, End diastolic velocity 25
cm/sec

CCA: 122 cm/sec

SYSTOLIC ICA/CCA RATIO:

ECA: 127 cm/sec

RIGHT CAROTID ARTERY: Moderate multifocal atherosclerotic plaque
formation, most prominent in the carotid bulb. No significant
tortuosity. Normal low resistance waveforms.

RIGHT VERTEBRAL ARTERY:  Antegrade flow.

LEFT CAROTID ARTERY: Moderate multifocal atherosclerotic plaque
formation. No significant tortuosity. Normal low resistance
waveforms.

LEFT VERTEBRAL ARTERY:  Antegrade flow.

Upper extremity non-invasive blood pressures:

Not obtained.
IMPRESSION: 1. Right carotid artery system: Less than 50% stenosis secondary to
moderate multifocal atherosclerotic plaque formation.

2. Left carotid artery system: Less than 50% stenosis secondary to
moderate multifocal atherosclerotic plaque formation.

3.  Vertebral artery system: Patent with antegrade flow bilaterally.

## 2020-06-09 ENCOUNTER — Inpatient Hospital Stay: Payer: Medicare HMO

## 2020-06-09 ENCOUNTER — Telehealth: Payer: Self-pay | Admitting: *Deleted

## 2020-06-09 ENCOUNTER — Other Ambulatory Visit: Payer: Self-pay | Admitting: *Deleted

## 2020-06-09 ENCOUNTER — Inpatient Hospital Stay (HOSPITAL_BASED_OUTPATIENT_CLINIC_OR_DEPARTMENT_OTHER): Payer: Medicare HMO | Admitting: Nurse Practitioner

## 2020-06-09 VITALS — BP 127/55 | HR 65 | Temp 99.1°F | Resp 18 | Ht 69.0 in | Wt 115.0 lb

## 2020-06-09 DIAGNOSIS — N1831 Chronic kidney disease, stage 3a: Secondary | ICD-10-CM

## 2020-06-09 DIAGNOSIS — C678 Malignant neoplasm of overlapping sites of bladder: Secondary | ICD-10-CM

## 2020-06-09 DIAGNOSIS — Z0000 Encounter for general adult medical examination without abnormal findings: Secondary | ICD-10-CM

## 2020-06-09 DIAGNOSIS — Z5112 Encounter for antineoplastic immunotherapy: Secondary | ICD-10-CM | POA: Diagnosis not present

## 2020-06-09 DIAGNOSIS — D631 Anemia in chronic kidney disease: Secondary | ICD-10-CM

## 2020-06-09 LAB — CBC WITH DIFFERENTIAL/PLATELET
Abs Immature Granulocytes: 0.03 10*3/uL (ref 0.00–0.07)
Basophils Absolute: 0.1 10*3/uL (ref 0.0–0.1)
Basophils Relative: 1 %
Eosinophils Absolute: 0.4 10*3/uL (ref 0.0–0.5)
Eosinophils Relative: 5 %
HCT: 25.8 % — ABNORMAL LOW (ref 39.0–52.0)
Hemoglobin: 8.3 g/dL — ABNORMAL LOW (ref 13.0–17.0)
Immature Granulocytes: 0 %
Lymphocytes Relative: 17 %
Lymphs Abs: 1.4 10*3/uL (ref 0.7–4.0)
MCH: 32.2 pg (ref 26.0–34.0)
MCHC: 32.2 g/dL (ref 30.0–36.0)
MCV: 100 fL (ref 80.0–100.0)
Monocytes Absolute: 1 10*3/uL (ref 0.1–1.0)
Monocytes Relative: 11 %
Neutro Abs: 5.7 10*3/uL (ref 1.7–7.7)
Neutrophils Relative %: 66 %
Platelets: 478 10*3/uL — ABNORMAL HIGH (ref 150–400)
RBC: 2.58 MIL/uL — ABNORMAL LOW (ref 4.22–5.81)
RDW: 13.2 % (ref 11.5–15.5)
WBC: 8.7 10*3/uL (ref 4.0–10.5)
nRBC: 0 % (ref 0.0–0.2)

## 2020-06-09 LAB — COMPREHENSIVE METABOLIC PANEL
ALT: 35 U/L (ref 0–44)
AST: 32 U/L (ref 15–41)
Albumin: 2.8 g/dL — ABNORMAL LOW (ref 3.5–5.0)
Alkaline Phosphatase: 129 U/L — ABNORMAL HIGH (ref 38–126)
Anion gap: 8 (ref 5–15)
BUN: 42 mg/dL — ABNORMAL HIGH (ref 8–23)
CO2: 22 mmol/L (ref 22–32)
Calcium: 9 mg/dL (ref 8.9–10.3)
Chloride: 105 mmol/L (ref 98–111)
Creatinine, Ser: 2.02 mg/dL — ABNORMAL HIGH (ref 0.61–1.24)
GFR, Estimated: 32 mL/min — ABNORMAL LOW (ref >60)
Glucose, Bld: 98 mg/dL (ref 70–99)
Potassium: 4.8 mmol/L (ref 3.5–5.1)
Sodium: 135 mmol/L (ref 135–145)
Total Bilirubin: 0.6 mg/dL (ref 0.3–1.2)
Total Protein: 7 g/dL (ref 6.5–8.1)

## 2020-06-09 LAB — SAMPLE TO BLOOD BANK

## 2020-06-09 MED ORDER — DEXAMETHASONE 4 MG PO TABS: 4.0000 mg | ORAL_TABLET | Freq: Every day | ORAL | 0 refills | Status: AC

## 2020-06-09 NOTE — Telephone Encounter (Signed)
1415-Son called and left vm. Wanted to know if steriods had been sent to pharmacy. I reviewed chart. Decadron script sent to pharmacy by Ander Purpura, NP. Returned son's call and left vm for son to pick up meds at pharmacy.

## 2020-06-09 NOTE — Telephone Encounter (Signed)
Son called reporting that patient is feeling weak and "rough this morning" also reports that patient has dizziness when he gets up. Asking for patient to be evaluated today.  Please advise   Notes  Component Ref Range & Units 13 d ago  (05/27/20) 3 wk ago  (05/13/20) 1 mo ago  (04/29/20) 1 mo ago  (04/15/20) 2 mo ago  (04/01/20) 2 mo ago  (03/18/20) 3 mo ago  (03/04/20)  WBC 4.0 - 10.5 K/uL 9.1  6.7  13.4High  8.4  7.1  6.8  8.3   RBC 4.22 - 5.81 MIL/uL 2.70Low  2.78Low  2.81Low  2.43Low  2.71Low  2.54Low  2.70Low   Hemoglobin 13.0 - 17.0 g/dL 8.8Low  9.2Low  9.3Low  8.0Low  8.9Low  8.3Low  8.8Low   HCT 39.0 - 52.0 % 27.0Low  28.1Low  28.1Low  23.9Low  26.7Low  24.7Low  26.1Low   MCV 80.0 - 100.0 fL 100.0  101.1High  100.0  98.4  98.5  97.2  96.7   MCH 26.0 - 34.0 pg 32.6  33.1  33.1  32.9  32.8  32.7  32.6   MCHC 30.0 - 36.0 g/dL 32.6  32.7  33.1  33.5  33.3  33.6  33.7   RDW 11.5 - 15.5 % 12.8  13.3  13.5  12.3  12.5  12.3  12.5   Platelets 150 - 400 K/uL 444High  219  245  220  222  222  219   nRBC 0.0 - 0.2 % 0.0  0.0  0.0  0.0  0.0  0.0  0.0   Neutrophils Relative % % 56  42  68  52  49  45  41   Neutro Abs 1.7 - 7.7 K/uL 5.1  2.9  9.2High  4.3  3.5  3.1  3.5   Lymphocytes Relative % 20  27  23  24  27   32  36   Lymphs Abs 0.7 - 4.0 K/uL 1.8  1.8  3.1  2.0  1.9  2.2  3.0   Monocytes Relative % 9  8  6  9  6  8  7    Monocytes Absolute 0.1 - 1.0 K/uL 0.8  0.5  0.8  0.8  0.5  0.5  0.6   Eosinophils Relative % 15  21  2  14  17  14  15    Eosinophils Absolute 0.0 - 0.5 K/uL 1.4High  1.4High  0.2  1.2High  1.2High  1.0High  1.2High   Basophils Relative % 0  1  0  1  1  1  1    Basophils Absolute 0.0 - 0.1 K/uL 0.0  0.1  0.0  0.1  0.1  0.1  0.1   Immature Granulocytes % 0  1  1  0  0  0  0   Abs Immature Granulocytes 0.00 - 0.07 K/uL 0.02  0.04 CM  0.16High CM  0.02 CM  0.02 CM  0.01 CM  0.01 CM   Comment: Performed at [facility], [address] [place],  [id number]  [place]  [facility] CLIN [place] CLIN [place] CLIN [place] CL[facility] LAB [place] [facility] LAB [place] CL[facility] LAB [facility] CLIN LAB         Specimen Collected: 05/27/20 08:36 Last Resulted: 05/27/20 08:47

## 2020-06-09 NOTE — Progress Notes (Signed)
Symptom Management [place]  [phone] [phone]  Patient Care Team: [name], [name], [name], MD as Consulting Physician (Gastroenterology)   Name of the patient: Edward Cummings  [id number]  1933/04/26   Date of visit: 06/09/20  Diagnosis-transitional cell bladder cancer  Chief complaint/ Reason for visit-   Heme/Onc history:  Oncology History Overview Note  # AUG 2019-TRANSITIONAL CELL BLADDER CA [~ 4cm tumor] s/p cystoscopy [Dr.Stoiff]  with extensive angiolymphatic invasion; [name]; [name]; mediastinal uptake; right pubic rami uptake.  # [name]; Jan 18th 2021- switched to [place]- [pt preference; [name]; July 2020 cystoscopy-no evidence of bladder malignancy/Dr. Bernardo Heater- enlarged prostate [PSA- 0.95; 2021]  # Molecular testing- PDL-1 CPS- 20%; [name]  ;GOALS: palliative  CURRENT/MOST RECENT THERAPY:OPDIVO [C]     Cancer of overlapping sites of bladder ([place])  02/28/2018 - 06/09/2019 Chemotherapy   The patient had atezolizumab (TECENTRIQ) 1,[name], 1,200 mg, Intravenous, Once, [name],200 mg (02/28/2018), 1,200 mg (03/21/2018), 1,200 mg (04/11/2018), 1,200 mg (05/02/2018), 1,200 mg (06/13/2018), 1,200 mg (05/23/2018), 1,200 mg (07/04/2018), 1,200 mg (07/25/2018), 1,200 mg (08/15/2018), 1,200 mg (09/05/2018), 1,200 mg (10/25/2018), 1,200 mg (11/22/2018), 1,200 mg (12/20/2018), 1,200 mg (01/10/2019), 1,200 mg (01/31/2019), 1,200 mg (02/21/2019),  1,200 mg (03/14/2019), 1,200 mg (04/04/2019), 1,200 mg (04/25/2019), 1,200 mg (05/16/2019), 1,200 mg (06/09/2019)  for chemotherapy treatment.    06/30/2019 -  Chemotherapy   The patient had nivolumab (OPDIVO) 240 mg in sodium chloride 0.9 % 100 mL chemo infusion, 240 mg, Intravenous, Once, 21 of 23 cycles Administration: 240 mg (06/30/2019), 240 mg (07/14/2019), 240 mg (07/28/2019), 240 mg (08/11/2019), 240 mg (08/25/2019), 240 mg (09/08/2019), 240 mg (09/22/2019), 240 mg (11/03/2019), 240 mg (11/17/2019), 240 mg (12/10/2019), 240 mg (12/24/2019), 240 mg (01/08/2020), 240 mg (01/22/2020), 240 mg (02/05/2020), 240 mg (02/19/2020), 240 mg (03/04/2020), 240 mg (03/18/2020), 240 mg (04/01/2020), 240 mg (04/29/2020), 240 mg (05/13/2020), 240 mg (05/27/2020)  for chemotherapy treatment.      Interval history-patient is a 86 year old male with above history of bladder cancer who presents to symptom management clinic for complaints of weakness and dizziness.  Symptoms have been gradually worsening over the past several days.  Says he feels "rough this morning".  His son who accompanies him today says that he eats very little and drinks very little.  Low energy level.  Sleeps most of the day.    ECOG FS:3 - Symptomatic, >50% confined to bed  Review of systems- Review of Systems  Constitutional: Positive for malaise/fatigue and weight loss. Negative for chills and fever.  HENT: Negative for hearing loss, nosebleeds, sore throat and tinnitus.   Eyes: Negative for blurred vision and double vision.  Respiratory: Negative for cough, hemoptysis, shortness of breath and wheezing.   Cardiovascular: Negative for chest pain, palpitations and leg swelling.  Gastrointestinal: Negative for abdominal pain, blood in stool, constipation, diarrhea, melena, nausea and vomiting.  Genitourinary: Negative for dysuria and urgency.  Musculoskeletal: Negative for back pain, falls, joint pain and myalgias.  Skin: Negative for itching and  rash.   Neurological: Positive for dizziness and weakness. Negative for tingling, sensory change, loss of consciousness and headaches.  Endo/Heme/Allergies: Negative for environmental allergies. Does not bruise/bleed easily.  Psychiatric/Behavioral: Negative for depression. The patient is not nervous/anxious and does not have insomnia.      No Known Allergies  Past Medical History:  Diagnosis Date  . Anemia   . Anxiety 12/31/2019  . Cancer ([place])    bladder  . Chronic kidney disease   . Depression   . Hypertension   . Neuromuscular disorder ([place])    Nerve damage to left face/eye since around 2002.    Past Surgical History:  Procedure Laterality Date  . CYSTOSCOPY W/ RETROGRADES Bilateral 01/25/2018   Procedure: CYSTOSCOPY WITH RETROGRADE PYELOGRAM;  [name], MD;  [name];  Service: Urology;  Laterality: Bilateral;  . CYSTOSCOPY W/ URETERAL STENT PLACEMENT Bilateral 01/06/2019   Procedure: CYSTOSCOPY WITH RETROGRADE PYELOGRAM/URETERAL STENT REMOVAL;  [name], MD;  [name];  Service: Urology;  Laterality: Bilateral;  . CYSTOSCOPY WITH STENT PLACEMENT Bilateral 01/25/2018   Procedure: CYSTOSCOPY WITH STENT PLACEMENT;  [name], MD;  [name];  Service: Urology;  Laterality: Bilateral;  . DORSAL SLIT N/A 01/06/2019   Procedure: DORSAL SLIT;  [name], MD;  [name];  Service: Urology;  [name];  . ESOPHAGOGASTRODUODENOSCOPY (EGD) WITH PROPOFOL N/A 11/12/2019   Procedure: ESOPHAGOGASTRODUODENOSCOPY (EGD) WITH PROPOFOL;  Surgeon: Virgel Manifold, MD;  Location: [facility] END[facility]COPY;  Service: Endoscopy;  [name];  . EYE SURGERY     Cornea transplants bilaterally & cataract surgery.  . TRANSURETHRAL RESECTION OF BLADDER TUMOR N/A 01/25/2018   Procedure: TRANSURETHRAL RESECTION OF BLADDER TUMOR (TURBT);  [name], MD;  [name];  Service: Urology;   [name];    Social History   Socioeconomic History  . Marital status: Married    Spouse name: [name]  . Number of children: Not on file  . Years of education: Not on file  . Highest education level: Not on file  Occupational History  . Not on file  Tobacco Use  . Smoking status: Former Smoker    Types: Cigarettes  . Smokeless tobacco: Current User    Types: Chew  . Tobacco comment: Stopped approximately 10 years ago.  Vaping Use  . Vaping Use: Never used  Substance and Sexual Activity  . Alcohol use: Not Currently    Alcohol/week: 2.0 standard drinks    Types: 2 Cans of beer per week    Comment: Daily  . Drug use: Never  . Sexual activity: Yes    Birth control/protection: None  Other Topics Concern  . Not on file  Social History Narrative  . Not on file   Social Determinants of Health   Financial Resource Strain: Not on file  Food Insecurity: Not on file  Transportation Needs: Not on file  Physical Activity: Not on file  Stress: Not on file  Social Connections: Not on file  Intimate Partner Violence: Not on file    Family History  Problem Relation Age of Onset  . Prostate cancer Neg Hx   . Kidney cancer Neg Hx   . Bladder Cancer Neg Hx      Current Outpatient Medications:  .  aspirin EC 81 MG tablet, Take 81 mg by mouth daily. , Disp: , Rfl:  .  docusate sodium (COLACE) 50 MG capsule, Take 50 mg by mouth  at bedtime., Disp: , Rfl:  .  fexofenadine (ALLEGRA) 180 MG tablet, Take 180 mg by mouth daily. , Disp: , Rfl:  .  fluticasone (FLONASE) 50 MCG/ACT nasal spray, Place 2 sprays into both nostrils daily as needed for allergies or rhinitis., Disp: 11.1 mL, Rfl: 6 .  levothyroxine (SYNTHROID) 88 MCG tablet, Take 1 tablet (88 mcg total) by mouth daily before breakfast. Empty stomach- 1 hour prior to breakfast., Disp: 90 tablet, Rfl: 3 .  loratadine (CLARITIN) 10 MG tablet, Take 1 tablet (10 mg total) by mouth daily., Disp: 30 tablet, Rfl: 11 .  Multiple  Vitamin (MULTIVITAMIN WITH MINERALS) TABS tablet, Take 1 tablet by mouth daily. , Disp: , Rfl:  .  oxybutynin (DITROPAN) 5 MG tablet, Take 1 tablet (5 mg total) by mouth 3 (three) times daily as needed., Disp: 90 tablet, Rfl: 0 .  prednisoLONE acetate (PRED FORTE) 1 % ophthalmic suspension, Place 1 drop into both eyes daily., Disp: , Rfl:  .  tamsulosin (FLOMAX) 0.4 MG CAPS capsule, Take 1 capsule (0.4 mg total) by mouth at bedtime., Disp: 90 capsule, Rfl: 1  Physical exam:  Vitals:   06/09/20 1215  BP: (!) 127/55  Pulse: 65  Resp: 18  Temp: 99.1 F (37.3 C)  TempSrc: Tympanic  Weight: 115 lb (52.2 kg)  Height: '5\' 9"'  (1.753 m)   Physical Exam Constitutional:      General: He is not in acute distress.    Appearance: He is well-developed and well-nourished.     Comments: Fatigued appearing. In recliner. Accompanied by son.   HENT:     Head: Normocephalic and atraumatic.     Nose: Nose normal.     Mouth/Throat:     Mouth: Oropharynx is clear and moist.     Pharynx: No oropharyngeal exudate.  Eyes:     General: No scleral icterus.    Extraocular Movements: EOM normal.     Conjunctiva/sclera: Conjunctivae normal.  Cardiovascular:     Rate and Rhythm: Normal rate and regular rhythm.     Heart sounds: Normal heart sounds.  Pulmonary:     Effort: Pulmonary effort is normal.     Breath sounds: Normal breath sounds. No wheezing.  Abdominal:     General: Bowel sounds are normal. There is no distension.     Palpations: Abdomen is soft.     Tenderness: There is no abdominal tenderness.  Musculoskeletal:        General: No edema. Normal range of motion.     Cervical back: Normal range of motion and neck supple.  Skin:    General: Skin is warm and dry.  Neurological:     General: No focal deficit present.     Mental Status: He is oriented to person, place, and time.     Motor: Weakness present.     Gait: Gait abnormal.  Psychiatric:        Mood and Affect: Mood and affect  normal.        Behavior: Behavior normal.      CMP Latest Ref Rng & Units 06/09/2020  Glucose 70 - 99 mg/dL 98  BUN 8 - 23 mg/dL 42(H)  Creatinine 0.61 - 1.24 mg/dL 2.02(H)  Sodium 135 - 145 mmol/L 135  Potassium 3.5 - 5.1 mmol/L 4.8  Chloride 98 - 111 mmol/L 105  CO2 22 - 32 mmol/L 22  Calcium 8.9 - 10.3 mg/dL 9.0  Total Protein 6.5 - 8.1 g/dL 7.0  Total Bilirubin 0.3 -  1.2 mg/dL 0.6  Alkaline Phos 38 - 126 U/L 129(H)  AST 15 - 41 U/L 32  ALT 0 - 44 U/L 35   CBC Latest Ref Rng & Units 06/09/2020  WBC 4.0 - 10.5 K/uL 8.7  Hemoglobin 13.0 - 17.0 g/dL 8.3(L)  Hematocrit 39.0 - 52.0 % 25.8(L)  Platelets 150 - 400 K/uL 478(H)    No images are attached to the encounter.  DG Cervical Spine 2 or 3 views  Result Date: 05/27/2020 CLINICAL DATA:  Neck pain for 3 weeks without known injury. EXAM: CERVICAL SPINE - 2-3 VIEW COMPARISON:  None. FINDINGS: No fracture or spondylolisthesis is noted. Moderate to severe degenerative disc disease is noted at C4-5, C5-6 and C6-7. No prevertebral soft tissue swelling is noted. Carotid artery calcifications are noted bilaterally in the cervical soft tissues. IMPRESSION: 1. Moderate to severe multilevel degenerative disc disease. No acute abnormality seen in the cervical spine. 2. Bilateral carotid artery calcifications. Carotid ultrasound is recommended for further evaluation. Electronically Signed   By: Marijo Conception M.D.   On: 05/27/2020 16:50   US Carotid Bilateral  Result Date: 06/08/2020 CLINICAL DATA:  86 year old male history of bilateral carotid artery calcifications. EXAM: BILATERAL CAROTID DUPLEX ULTRASOUND TECHNIQUE: Pearline Cables scale imaging, color Doppler and duplex ultrasound were performed of bilateral carotid and vertebral arteries in the neck. COMPARISON:  07/02/2008 FINDINGS: Criteria: Quantification of carotid stenosis is based on velocity parameters that correlate the residual internal carotid diameter with NASCET-based stenosis levels,  using the diameter of the distal internal carotid lumen as the denominator for stenosis measurement. The following velocity measurements were obtained: RIGHT ICA: Peak systolic velocity 384 cm/sec, End diastolic velocity 20 cm/sec CCA: Peak systolic velocity 99 cm/sec SYSTOLIC ICA/CCA RATIO:  1.1 ECA: Peak systolic velocity 665 cm/sec LEFT ICA: Peak systolic velocity 993 cm/sec, End diastolic velocity 25 cm/sec CCA: 570 cm/sec SYSTOLIC ICA/CCA RATIO:  0.8 ECA: 127 cm/sec RIGHT CAROTID ARTERY: Moderate multifocal atherosclerotic plaque formation, most prominent in the carotid bulb. No significant tortuosity. Normal low resistance waveforms. RIGHT VERTEBRAL ARTERY:  Antegrade flow. LEFT CAROTID ARTERY: Moderate multifocal atherosclerotic plaque formation. No significant tortuosity. Normal low resistance waveforms. LEFT VERTEBRAL ARTERY:  Antegrade flow. Upper extremity non-invasive blood pressures: Not obtained. IMPRESSION: 1. Right carotid artery system: Less than 50% stenosis secondary to moderate multifocal atherosclerotic plaque formation. 2. Left carotid artery system: Less than 50% stenosis secondary to moderate multifocal atherosclerotic plaque formation. 3.  Vertebral artery system: Patent with antegrade flow bilaterally. Ruthann Cancer, MD [facility] Electronically Signed   By: Ruthann Cancer MD   On: 06/08/2020 14:45    Assessment and plan- Patient is a 86 y.o. male diagnosed with bladder cancer presents to symptom management clinic for weakness and malnutrition.  1.  Weight loss and fatigue-likely related to treatment and anemia. Start palliative Decadron 4 mg daily for appetite and energy.   2.  Anemia-hemoglobin 8.3.  Likely contributing to her fatigue and weakness.  Plan for possible blood transfusion at next visit versus IV iron.  RTC as scheduled for labs and re-evaluation.    Visit Diagnosis 1. Anemia due to stage 3a chronic kidney  disease ([place])   2. Cancer of overlapping sites of bladder [facility])     Patient expressed understanding and was in agreement with this plan. He also understands that He can call clinic at any time with any questions, concerns, or complaints.   Thank you for allowing me to participate in  the care of this very pleasant patient.   Beckey Rutter, DNP, AGNP-C [facility]

## 2020-06-09 NOTE — Telephone Encounter (Signed)
V/o Edward Purpura, NP - Lab orders added- cbc, metc, hold tube. Message left for son - Edward Cummings to return my phone call. Patient needs to have an apt asap in the cancer center. I asked Son if he would bring patient to clinic this morning.

## 2020-06-09 NOTE — Telephone Encounter (Signed)
I spoke with son who said he just talked with someone and that he is bringing patient in now

## 2020-06-17 ENCOUNTER — Inpatient Hospital Stay: Payer: Medicare HMO | Attending: Nurse Practitioner

## 2020-06-17 ENCOUNTER — Other Ambulatory Visit: Payer: Self-pay

## 2020-06-17 ENCOUNTER — Inpatient Hospital Stay: Payer: Medicare HMO

## 2020-06-17 ENCOUNTER — Other Ambulatory Visit: Payer: Self-pay | Admitting: Oncology

## 2020-06-17 ENCOUNTER — Inpatient Hospital Stay: Payer: Medicare HMO | Admitting: Nurse Practitioner

## 2020-06-17 VITALS — BP 122/65 | HR 55

## 2020-06-17 DIAGNOSIS — C772 Secondary and unspecified malignant neoplasm of intra-abdominal lymph nodes: Secondary | ICD-10-CM | POA: Diagnosis not present

## 2020-06-17 DIAGNOSIS — Z7982 Long term (current) use of aspirin: Secondary | ICD-10-CM | POA: Diagnosis not present

## 2020-06-17 DIAGNOSIS — Z87891 Personal history of nicotine dependence: Secondary | ICD-10-CM | POA: Diagnosis not present

## 2020-06-17 DIAGNOSIS — Z5112 Encounter for antineoplastic immunotherapy: Secondary | ICD-10-CM | POA: Insufficient documentation

## 2020-06-17 DIAGNOSIS — D631 Anemia in chronic kidney disease: Secondary | ICD-10-CM | POA: Diagnosis not present

## 2020-06-17 DIAGNOSIS — N1831 Chronic kidney disease, stage 3a: Secondary | ICD-10-CM

## 2020-06-17 DIAGNOSIS — N184 Chronic kidney disease, stage 4 (severe): Secondary | ICD-10-CM | POA: Diagnosis not present

## 2020-06-17 DIAGNOSIS — E2740 Unspecified adrenocortical insufficiency: Secondary | ICD-10-CM | POA: Insufficient documentation

## 2020-06-17 DIAGNOSIS — Z79899 Other long term (current) drug therapy: Secondary | ICD-10-CM | POA: Diagnosis not present

## 2020-06-17 DIAGNOSIS — I959 Hypotension, unspecified: Secondary | ICD-10-CM | POA: Diagnosis not present

## 2020-06-17 DIAGNOSIS — Z0000 Encounter for general adult medical examination without abnormal findings: Secondary | ICD-10-CM

## 2020-06-17 DIAGNOSIS — E032 Hypothyroidism due to medicaments and other exogenous substances: Secondary | ICD-10-CM | POA: Diagnosis not present

## 2020-06-17 DIAGNOSIS — M8580 Other specified disorders of bone density and structure, unspecified site: Secondary | ICD-10-CM | POA: Diagnosis not present

## 2020-06-17 DIAGNOSIS — N400 Enlarged prostate without lower urinary tract symptoms: Secondary | ICD-10-CM | POA: Diagnosis not present

## 2020-06-17 DIAGNOSIS — C678 Malignant neoplasm of overlapping sites of bladder: Secondary | ICD-10-CM

## 2020-06-17 DIAGNOSIS — I129 Hypertensive chronic kidney disease with stage 1 through stage 4 chronic kidney disease, or unspecified chronic kidney disease: Secondary | ICD-10-CM | POA: Diagnosis not present

## 2020-06-17 LAB — CBC WITH DIFFERENTIAL/PLATELET
Abs Immature Granulocytes: 0.14 10*3/uL — ABNORMAL HIGH (ref 0.00–0.07)
Basophils Absolute: 0 10*3/uL (ref 0.0–0.1)
Basophils Relative: 0 %
Eosinophils Absolute: 0 10*3/uL (ref 0.0–0.5)
Eosinophils Relative: 0 %
HCT: 30.3 % — ABNORMAL LOW (ref 39.0–52.0)
Hemoglobin: 9.9 g/dL — ABNORMAL LOW (ref 13.0–17.0)
Immature Granulocytes: 2 %
Lymphocytes Relative: 13 %
Lymphs Abs: 1.2 10*3/uL (ref 0.7–4.0)
MCH: 32.6 pg (ref 26.0–34.0)
MCHC: 32.7 g/dL (ref 30.0–36.0)
MCV: 99.7 fL (ref 80.0–100.0)
Monocytes Absolute: 0.1 10*3/uL (ref 0.1–1.0)
Monocytes Relative: 1 %
Neutro Abs: 8 10*3/uL — ABNORMAL HIGH (ref 1.7–7.7)
Neutrophils Relative %: 84 %
Platelets: 375 10*3/uL (ref 150–400)
RBC: 3.04 MIL/uL — ABNORMAL LOW (ref 4.22–5.81)
RDW: 13.4 % (ref 11.5–15.5)
WBC: 9.4 10*3/uL (ref 4.0–10.5)
nRBC: 0 % (ref 0.0–0.2)

## 2020-06-17 LAB — COMPREHENSIVE METABOLIC PANEL
ALT: 41 U/L (ref 0–44)
AST: 24 U/L (ref 15–41)
Albumin: 3.1 g/dL — ABNORMAL LOW (ref 3.5–5.0)
Alkaline Phosphatase: 106 U/L (ref 38–126)
Anion gap: 9 (ref 5–15)
BUN: 49 mg/dL — ABNORMAL HIGH (ref 8–23)
CO2: 22 mmol/L (ref 22–32)
Calcium: 8.9 mg/dL (ref 8.9–10.3)
Chloride: 103 mmol/L (ref 98–111)
Creatinine, Ser: 2.15 mg/dL — ABNORMAL HIGH (ref 0.61–1.24)
GFR, Estimated: 29 mL/min — ABNORMAL LOW (ref >60)
Glucose, Bld: 221 mg/dL — ABNORMAL HIGH (ref 70–99)
Potassium: 5.2 mmol/L — ABNORMAL HIGH (ref 3.5–5.1)
Sodium: 134 mmol/L — ABNORMAL LOW (ref 135–145)
Total Bilirubin: 0.7 mg/dL (ref 0.3–1.2)
Total Protein: 6.9 g/dL (ref 6.5–8.1)

## 2020-06-17 LAB — SAMPLE TO BLOOD BANK

## 2020-06-17 MED ORDER — SODIUM CHLORIDE 0.9 % IV SOLN
240.0000 mg | Freq: Once | INTRAVENOUS | Status: AC
  Administered 2020-06-17: 240 mg via INTRAVENOUS
  Filled 2020-06-17: qty 24

## 2020-06-17 MED ORDER — IRON SUCROSE 20 MG/ML IV SOLN
200.0000 mg | Freq: Once | INTRAVENOUS | Status: AC
  Administered 2020-06-17: 200 mg via INTRAVENOUS
  Filled 2020-06-17: qty 10

## 2020-06-17 MED ORDER — SODIUM CHLORIDE 0.9 % IV SOLN
Freq: Once | INTRAVENOUS | Status: AC
  Filled 2020-06-17: qty 250

## 2020-06-17 NOTE — Assessment & Plan Note (Signed)
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  Stage IV; NOV 1s, 2021-  CT-chest and pelvis-noncontrast- STABLE. No findings of recurrent malignancy; Bilateral Ground glass opacities noted [see below]  # Proceed with opdivo today. Labs today reviewed;  acceptable for treatment today.   #Neck pain question musculoskeletal.  Currently improved on NSAIDs/heating pad.  Check x-rays.  If worse would recommend further imaging.  # Incidentral [NOv 1st, 2021- ] new ground-glass opacities in both lungs primarily- resolved; will monitor.   # Iatrogenic hypothyroidism-on Synthroid 88 mcg; TSH November 2021-.  Within normal limits  # Anemia sec to CKD/ on Iv iron.Hb-8.8- iron sats- 11% [NOV 2021];STABLE; s/p cysto [02/11/20]; proceed with venofer today  # weight loss-unclear etiology; thyroid levels normal.  Recommend evaluation with [facility].  # CKD stage IV-GFR 26- STABLE.    # DISPOSITION:  # today OPDIVO; Proceed  IV venofer.   # Follow-up in 3 weeks- X- MD labs-cbc/cmp;opdivo IV; IV venofer;   #  Follow-up in 5 weeks- MD labs-cbc/cmp;opdivo IV; IV venofer; -Dr.B  Addendum: X-rays of the neck shows arthritis; no acute process.  However if worsening pain noted would recommend further imaging-neck MRI.

## 2020-06-17 NOTE — Progress Notes (Signed)
[place] Interval Note  Patient Care Team: [name], [name], [name], [name]; [name]; [name]; mediastinal uptake; right pubic rami uptake.  # [name]; Jan 18th 2021- switched to [place]- [pt preference; [name]; July 2020 cystoscopy-no evidence of bladder malignancy/Dr. Bernardo Heater- enlarged prostate [PSA- 0.95; 2021]  # Molecular testing- PDL-1 CPS- 20%; [name]  ;GOALS: palliative  CURRENT/MOST RECENT THERAPY:OPDIVO [C]     Cancer of overlapping sites of bladder ([place])  02/28/2018 - 06/09/2019 Chemotherapy   The patient had atezolizumab (TECENTRIQ) 1,[name], 1,200 mg, Intravenous, Once, [name],200 mg (02/28/2018), 1,200 mg (03/21/2018), 1,200 mg (04/11/2018), 1,200 mg (05/02/2018), 1,200 mg (06/13/2018), 1,200 mg (05/23/2018), 1,200 mg (07/04/2018), 1,200 mg (07/25/2018), 1,200 mg (08/15/2018), 1,200 mg (09/05/2018), 1,200 mg (10/25/2018), 1,200 mg (11/22/2018), 1,200 mg (12/20/2018), 1,200 mg (01/10/2019), 1,200 mg (01/31/2019), 1,200 mg (02/21/2019), 1,200 mg (03/14/2019), 1,200 mg (04/04/2019), 1,200 mg (04/25/2019), 1,200 mg (05/16/2019), 1,200 mg (06/09/2019)  for chemotherapy treatment.    06/30/2019 -  Chemotherapy   The patient had nivolumab  (OPDIVO) 240 mg in sodium chloride 0.9 % 100 mL chemo infusion, 240 mg, Intravenous, Once, 22 of 23 cycles Administration: 240 mg (06/30/2019), 240 mg (07/14/2019), 240 mg (07/28/2019), 240 mg (08/11/2019), 240 mg (08/25/2019), 240 mg (09/08/2019), 240 mg (09/22/2019), 240 mg (11/03/2019), 240 mg (11/17/2019), 240 mg (12/10/2019), 240 mg (12/24/2019), 240 mg (01/08/2020), 240 mg (01/22/2020), 240 mg (02/05/2020), 240 mg (02/19/2020), 240 mg (03/04/2020), 240 mg (03/18/2020), 240 mg (04/01/2020), 240 mg (04/29/2020), 240 mg (05/13/2020), 240 mg (05/27/2020), 240 mg (06/17/2020)  for chemotherapy treatment.     HISTORY OF PRESENTING ILLNESS: Edward Cummings 87 y.o.  male with metastatic transitional carcinoma of the bladder, currently on Opdivo, who returns to clinic for follow-up.  He was seen in symptom management clinic and received steroids to stimulate appetite.  Patient's son who accompanies him today says he ate well and seems to put on weight.  Energy has also improved.  Sadly, his dog passed away earlier this week and he has been suffering from grief.  Pain is stable.  No nausea, vomiting, diarrhea.  No blood in the stools or black or tarry stools.  No skin rashes, mouth sores.  No chest pain or shortness of breath.  No abdominal pain.   Review of Systems  Constitutional: Positive for malaise/fatigue. Negative for chills, diaphoresis and fever.  HENT: Negative for nosebleeds and sore throat.   Eyes: Negative for double vision.  Respiratory: Negative for hemoptysis, sputum production and wheezing.   Cardiovascular: Negative for chest pain, palpitations, orthopnea and leg swelling.  Gastrointestinal: Negative for abdominal pain, blood in stool, diarrhea, heartburn, melena, nausea and vomiting.  Genitourinary: Positive for frequency and urgency. Negative for dysuria.  Musculoskeletal: Positive for back pain. Negative for joint pain.  Skin: Negative.  Negative for itching and  rash.  Neurological: Negative for tingling,  focal weakness, weakness and headaches.  Endo/Heme/Allergies: Does not bruise/bleed easily.  Psychiatric/Behavioral: Negative for depression. The patient is not nervous/anxious and does not have insomnia.      MEDICAL HISTORY:  Past Medical History:  Diagnosis Date  . Anemia   . Anxiety 12/31/2019  . Cancer ([place])    bladder  . Chronic kidney disease   . Depression   . Hypertension   . Neuromuscular disorder ([place])    Nerve damage to left face/eye since around 2002.    SURGICAL HISTORY: Past Surgical History:  Procedure Laterality Date  . CYSTOSCOPY W/ RETROGRADES Bilateral 01/25/2018   Procedure: CYSTOSCOPY WITH RETROGRADE PYELOGRAM;  [name], MD;  [name];  Service: Urology;  Laterality: Bilateral;  . CYSTOSCOPY W/ URETERAL STENT PLACEMENT Bilateral 01/06/2019   Procedure: CYSTOSCOPY WITH RETROGRADE PYELOGRAM/URETERAL STENT REMOVAL;  [name], MD;  [name];  Service: Urology;  Laterality: Bilateral;  . CYSTOSCOPY WITH STENT PLACEMENT Bilateral 01/25/2018   Procedure: CYSTOSCOPY WITH STENT PLACEMENT;  [name], MD;  [name];  Service: Urology;  Laterality: Bilateral;  . DORSAL SLIT N/A 01/06/2019   Procedure: DORSAL SLIT;  [name], MD;  [name];  Service: Urology;  [name];  . ESOPHAGOGASTRODUODENOSCOPY (EGD) WITH PROPOFOL N/A 11/12/2019   Procedure: ESOPHAGOGASTRODUODENOSCOPY (EGD) WITH PROPOFOL;  Surgeon: Virgel Manifold, MD;  Location: [facility];  Service: Endoscopy;  [name];  . EYE SURGERY     Cornea transplants bilaterally & cataract surgery.  . TRANSURETHRAL RESECTION OF BLADDER TUMOR N/A 01/25/2018   Procedure: TRANSURETHRAL RESECTION OF BLADDER TUMOR (TURBT);  [name], MD;  [name];  Service: Urology;  [name];    SOCIAL HISTORY: lives in [place]; widowed. Former smoker. beer every 2 months or  so.mechanic/retd.   Social History   Socioeconomic History  . Marital status: Married    Spouse name: [name]  . Number of children: Not on file  . Years of education: Not on file  . Highest education level: Not on file  Occupational History  . Not on file  Tobacco Use  . Smoking status: Former Smoker    Types: Cigarettes  . Smokeless tobacco: Current User    Types: Chew  . Tobacco comment: Stopped approximately 10 years ago.  Vaping Use  . Vaping Use: Never used  Substance and Sexual Activity  . Alcohol use: Not Currently    Alcohol/week: 2.0 standard drinks    Types: 2 Cans of beer per week    Comment: Daily  . Drug use: Never  . Sexual activity: Yes    Birth control/protection: None  Other Topics Concern  . Not on file  Social History Narrative  . Not on file   Social Determinants of Health   Financial Resource Strain: Not on file  Food Insecurity: Not on file  Transportation Needs: Not on file  Physical Activity: Not on file  Stress: Not on file  Social Connections: Not on file  Intimate Partner Violence: Not on file   Immunization History  Administered Date(s) Administered  . Pneumococcal Polysaccharide-23 04/08/2018    FAMILY HISTORY: Family History  Problem Relation Age of Onset  . Prostate cancer Neg Hx   . Kidney cancer Neg Hx   . Bladder Cancer Neg Hx     ALLERGIES:  has No Known Allergies.  MEDICATIONS:  Current Outpatient Medications  Medication Sig  Dispense Refill  . aspirin EC 81 MG tablet Take 81 mg by mouth daily.     Marland Kitchen docusate sodium (COLACE) 50 MG capsule Take 50 mg by mouth at bedtime.    . fexofenadine (ALLEGRA) 180 MG tablet Take 180 mg by mouth daily.     . fluticasone (FLONASE) 50 MCG/ACT nasal spray Place 2 sprays into both nostrils daily as needed for allergies or rhinitis. 11.1 mL 6  . levothyroxine (SYNTHROID) 88 MCG tablet Take 1 tablet (88 mcg total) by mouth daily before breakfast. Empty stomach- 1 hour prior to breakfast.  90 tablet 3  . loratadine (CLARITIN) 10 MG tablet Take 1 tablet (10 mg total) by mouth daily. 30 tablet 11  . Multiple Vitamin (MULTIVITAMIN WITH MINERALS) TABS tablet Take 1 tablet by mouth daily.     . prednisoLONE acetate (PRED FORTE) 1 % ophthalmic suspension Place 1 drop into both eyes daily.    . tamsulosin (FLOMAX) 0.4 MG CAPS capsule Take 1 capsule (0.4 mg total) by mouth at bedtime. 90 capsule 1   No current facility-administered medications for this visit.   PHYSICAL EXAMINATION: ECOG PERFORMANCE STATUS: 1 - Symptomatic but completely ambulatory  Vitals:   06/17/20 0900  BP: (!) 109/58  Pulse: 87  Resp: 20  Temp: (!) 97.5 F (36.4 C)   Filed Weights   06/17/20 0900  Weight: 118 lb (53.5 kg)    Physical Exam Constitutional:      Comments: Thin build. Appears more alert than previous visit. Accompanied by son.   HENT:     Head: Normocephalic and atraumatic.     Mouth/Throat:     Pharynx: No oropharyngeal exudate.  Eyes:     Conjunctiva/sclera: Conjunctivae normal.     Comments: Chronic drooping of the left eyelid.  Cardiovascular:     Rate and Rhythm: Normal rate and regular rhythm.  Pulmonary:     Effort: No respiratory distress.     Breath sounds: No wheezing.  Abdominal:     General: There is no distension.     Palpations: Abdomen is soft. There is no mass.     Tenderness: There is no abdominal tenderness. There is no guarding or rebound.  Musculoskeletal:        General: No tenderness. Normal range of motion.     Cervical back: Normal range of motion and neck supple.     Comments: Ambulatory w/o aids  Skin:    General: Skin is warm.  Neurological:     Mental Status: He is alert and oriented to person, place, and time.  Psychiatric:        Mood and Affect: Mood and affect normal.        Behavior: Behavior normal.    LABORATORY DATA:  I have reviewed the data as listed Lab Results  Component Value Date   WBC 9.4 06/17/2020   HGB 9.9 (L)  06/17/2020   HCT 30.3 (L) 06/17/2020   MCV 99.7 06/17/2020   PLT 375 06/17/2020   Recent Labs    02/05/20 0835 02/19/20 0813 03/04/20 0801 03/18/20 0819 05/27/20 0836 06/09/20 1202 06/17/20 0851  NA 135 135 137   < > 134* 135 134*  K 4.3 4.7 4.7   < > 4.6 4.8 5.2*  CL 107 106 107   < > 106 105 103  CO2 21* 21* 23   < > 16* 22 22  GLUCOSE 139* 101* 98   < > 114* 98 221*  BUN 49* 34*  33*   < > 46* 42* 49*  CREATININE 2.30* 2.24* 2.29*   < > 2.39* 2.02* 2.15*  CALCIUM 9.0 8.9 8.9   < > 8.9 9.0 8.9  GFRNONAA 25* 26* 25*   < > 26* 32* 29*  GFRAA 29* 30* 29*  --   --   --   --   PROT 7.1 7.0 7.0   < > 7.0 7.0 6.9  ALBUMIN 4.0 3.9 4.0   < > 3.1* 2.8* 3.1*  AST _0 < > 29 32 24  ALT _1 < > 24 35 41  ALKPHOS 70 70 65   < > 97 129* 106  BILITOT 0.6 0.8 0.6   < > 0.6 0.6 0.7   < > = values in this interval not displayed.    RADIOGRAPHIC STUDIES: I have personally reviewed the radiological images as listed and agreed with the findings in the report. DG Cervical Spine 2 or 3 views  Result Date: 05/27/2020 CLINICAL DATA:  Neck pain for 3 weeks without known injury. EXAM: CERVICAL SPINE - 2-3 VIEW COMPARISON:  None. FINDINGS: No fracture or spondylolisthesis is noted. Moderate to severe degenerative disc disease is noted at C4-5, C5-6 and C6-7. No prevertebral soft tissue swelling is noted. Carotid artery calcifications are noted bilaterally in the cervical soft tissues. IMPRESSION: 1. Moderate to severe multilevel degenerative disc disease. No acute abnormality seen in the cervical spine. 2. Bilateral carotid artery calcifications. Carotid ultrasound is recommended for further evaluation. Electronically Signed   By: Marijo Conception M.D.   On: 05/27/2020 16:50   US Carotid Bilateral  Result Date: 06/08/2020 CLINICAL DATA:  87 year old male history of bilateral carotid artery calcifications. EXAM: BILATERAL CAROTID DUPLEX ULTRASOUND TECHNIQUE: Pearline Cables scale imaging, color  Doppler and duplex ultrasound were performed of bilateral carotid and vertebral arteries in the neck. COMPARISON:  07/02/2008 FINDINGS: Criteria: Quantification of carotid stenosis is based on velocity parameters that correlate the residual internal carotid diameter with NASCET-based stenosis levels, using the diameter of the distal internal carotid lumen as the denominator for stenosis measurement. The following velocity measurements were obtained: RIGHT ICA: Peak systolic velocity 694 cm/sec, End diastolic velocity 20 cm/sec CCA: Peak systolic velocity 99 cm/sec SYSTOLIC ICA/CCA RATIO:  1.1 ECA: Peak systolic velocity 854 cm/sec LEFT ICA: Peak systolic velocity 627 cm/sec, End diastolic velocity 25 cm/sec CCA: 035 cm/sec SYSTOLIC ICA/CCA RATIO:  0.8 ECA: 127 cm/sec RIGHT CAROTID ARTERY: Moderate multifocal atherosclerotic plaque formation, most prominent in the carotid bulb. No significant tortuosity. Normal low resistance waveforms. RIGHT VERTEBRAL ARTERY:  Antegrade flow. LEFT CAROTID ARTERY: Moderate multifocal atherosclerotic plaque formation. No significant tortuosity. Normal low resistance waveforms. LEFT VERTEBRAL ARTERY:  Antegrade flow. Upper extremity non-invasive blood pressures: Not obtained. IMPRESSION: 1. Right carotid artery system: Less than 50% stenosis secondary to moderate multifocal atherosclerotic plaque formation. 2. Left carotid artery system: Less than 50% stenosis secondary to moderate multifocal atherosclerotic plaque formation. 3.  Vertebral artery system: Patent with antegrade flow bilaterally. Ruthann Cancer, MD [facility] Electronically Signed   By: Ruthann Cancer MD   On: 06/08/2020 14:45    ASSESSMENT & PLAN:   1. high-grade transitional cell carcinoma of the bladder metastatic to retroperitoneal lymph node-stage IV- 04/12/20- CT chest and pelvis, noncontrast was reported as stable w/o evidence of recurrence. Bilateral  ground glass opacities in both lungs resolved. Labs reviewed today and acceptable to proceed with treatment. Potassium  upper limit of normal (see below).   2. Anemia- secondary to ckd. Hemoglobin slightly up. Venofer today.   3. CKD- stage IV. GFR stable.   4. Weight loss- question grief component. S/p steroids x 7 days with improvement. Patient will consider appetite stimulating antidepressant.   5. Iatrogenic hypothyroidism- on synthroid 88 mcg. Will check tsh at next visit.   6. Elevated potassium- Upper limit of normal. No K sparing diuretics or K supplements. Monitor in setting of Opdivo  Proceed with Opdivo and venofer today. RTC in 2 weeks for lab (cbc, cmp, tsh), MD, opdivo, possible venofer.   No problem-specific Assessment & Plan notes found for this encounter.  All questions were answered. The patient knows to call the clinic with any problems, questions or concerns.    Verlon Au, NP 06/17/2020 4:34 PM

## 2020-06-17 NOTE — Progress Notes (Signed)
Patient states that he had all 3 covid vaccinations. He does not have his covid vaccine card and can not remember the dates of vaccinations. He possible received Pfizer, but can not recall Encouraged patient to bring his vaccination cards at the next visit.  Patient's appetite is improved. He c/o some thoughts of depression. He lost his dog this week and the holidays have been a struggle in adjustments to his wife's death. Family is supportive.

## 2020-06-17 NOTE — Progress Notes (Signed)
Pt tolerated infusion well with no signs of complications. VSS. Pt stable for discharge.   Edward Cummings  

## 2020-06-21 ENCOUNTER — Other Ambulatory Visit: Payer: Self-pay | Admitting: Internal Medicine

## 2020-07-01 ENCOUNTER — Encounter: Payer: Self-pay | Admitting: *Deleted

## 2020-07-01 ENCOUNTER — Encounter: Payer: Self-pay | Admitting: Medical Oncology

## 2020-07-01 ENCOUNTER — Emergency Department
Admission: EM | Admit: 2020-07-01 | Discharge: 2020-07-01 | Disposition: A | Discharge status: Home / Self Care | Payer: Medicare HMO | Attending: Emergency Medicine | Admitting: Emergency Medicine

## 2020-07-01 ENCOUNTER — Inpatient Hospital Stay: Payer: Medicare HMO

## 2020-07-01 ENCOUNTER — Other Ambulatory Visit: Payer: Self-pay

## 2020-07-01 ENCOUNTER — Emergency Department: Payer: Medicare HMO

## 2020-07-01 ENCOUNTER — Inpatient Hospital Stay (HOSPITAL_BASED_OUTPATIENT_CLINIC_OR_DEPARTMENT_OTHER): Payer: Medicare HMO | Admitting: Internal Medicine

## 2020-07-01 VITALS — BP 87/54 | HR 95 | Temp 99.0°F | Resp 18 | Wt 112.8 lb

## 2020-07-01 DIAGNOSIS — R531 Weakness: Secondary | ICD-10-CM | POA: Diagnosis not present

## 2020-07-01 DIAGNOSIS — C678 Malignant neoplasm of overlapping sites of bladder: Secondary | ICD-10-CM

## 2020-07-01 DIAGNOSIS — Z87891 Personal history of nicotine dependence: Secondary | ICD-10-CM | POA: Diagnosis not present

## 2020-07-01 DIAGNOSIS — Z8551 Personal history of malignant neoplasm of bladder: Secondary | ICD-10-CM | POA: Insufficient documentation

## 2020-07-01 DIAGNOSIS — I129 Hypertensive chronic kidney disease with stage 1 through stage 4 chronic kidney disease, or unspecified chronic kidney disease: Secondary | ICD-10-CM | POA: Insufficient documentation

## 2020-07-01 DIAGNOSIS — N1831 Chronic kidney disease, stage 3a: Secondary | ICD-10-CM | POA: Diagnosis not present

## 2020-07-01 DIAGNOSIS — U071 COVID-19: Secondary | ICD-10-CM

## 2020-07-01 DIAGNOSIS — Z79899 Other long term (current) drug therapy: Secondary | ICD-10-CM | POA: Insufficient documentation

## 2020-07-01 DIAGNOSIS — Z7982 Long term (current) use of aspirin: Secondary | ICD-10-CM | POA: Insufficient documentation

## 2020-07-01 DIAGNOSIS — D631 Anemia in chronic kidney disease: Secondary | ICD-10-CM | POA: Diagnosis not present

## 2020-07-01 DIAGNOSIS — Z20822 Contact with and (suspected) exposure to covid-19: Secondary | ICD-10-CM | POA: Insufficient documentation

## 2020-07-01 DIAGNOSIS — E039 Hypothyroidism, unspecified: Secondary | ICD-10-CM | POA: Diagnosis not present

## 2020-07-01 DIAGNOSIS — R0602 Shortness of breath: Secondary | ICD-10-CM | POA: Diagnosis not present

## 2020-07-01 LAB — COMPREHENSIVE METABOLIC PANEL
ALT: 41 U/L (ref 0–44)
ALT: 40 U/L (ref 0–44)
AST: 50 U/L — ABNORMAL HIGH (ref 15–41)
AST: 44 U/L — ABNORMAL HIGH (ref 15–41)
Albumin: 2.9 g/dL — ABNORMAL LOW (ref 3.5–5.0)
Albumin: 2.9 g/dL — ABNORMAL LOW (ref 3.5–5.0)
Alkaline Phosphatase: 108 U/L (ref 38–126)
Alkaline Phosphatase: 108 U/L (ref 38–126)
Anion gap: 9 (ref 5–15)
Anion gap: 9 (ref 5–15)
BUN: 51 mg/dL — ABNORMAL HIGH (ref 8–23)
BUN: 52 mg/dL — ABNORMAL HIGH (ref 8–23)
CO2: 20 mmol/L — ABNORMAL LOW (ref 22–32)
CO2: 21 mmol/L — ABNORMAL LOW (ref 22–32)
Calcium: 9 mg/dL (ref 8.9–10.3)
Calcium: 9.1 mg/dL (ref 8.9–10.3)
Chloride: 107 mmol/L (ref 98–111)
Chloride: 107 mmol/L (ref 98–111)
Creatinine, Ser: 2.45 mg/dL — ABNORMAL HIGH (ref 0.61–1.24)
Creatinine, Ser: 2.37 mg/dL — ABNORMAL HIGH (ref 0.61–1.24)
GFR, Estimated: 25 mL/min — ABNORMAL LOW (ref >60)
GFR, Estimated: 26 mL/min — ABNORMAL LOW (ref >60)
Glucose, Bld: 103 mg/dL — ABNORMAL HIGH (ref 70–99)
Glucose, Bld: 96 mg/dL (ref 70–99)
Potassium: 4.6 mmol/L (ref 3.5–5.1)
Potassium: 5 mmol/L (ref 3.5–5.1)
Sodium: 136 mmol/L (ref 135–145)
Sodium: 137 mmol/L (ref 135–145)
Total Bilirubin: 0.8 mg/dL (ref 0.3–1.2)
Total Bilirubin: 0.9 mg/dL (ref 0.3–1.2)
Total Protein: 7 g/dL (ref 6.5–8.1)
Total Protein: 7.2 g/dL (ref 6.5–8.1)

## 2020-07-01 LAB — CBC WITH DIFFERENTIAL/PLATELET
Abs Immature Granulocytes: 0.03 10*3/uL (ref 0.00–0.07)
Abs Immature Granulocytes: 0.02 10*3/uL (ref 0.00–0.07)
Basophils Absolute: 0.1 10*3/uL (ref 0.0–0.1)
Basophils Absolute: 0 10*3/uL (ref 0.0–0.1)
Basophils Relative: 1 %
Basophils Relative: 0 %
Eosinophils Absolute: 0.7 10*3/uL — ABNORMAL HIGH (ref 0.0–0.5)
Eosinophils Absolute: 0.3 10*3/uL (ref 0.0–0.5)
Eosinophils Relative: 9 %
Eosinophils Relative: 4 %
HCT: 26.3 % — ABNORMAL LOW (ref 39.0–52.0)
HCT: 27 % — ABNORMAL LOW (ref 39.0–52.0)
Hemoglobin: 8.8 g/dL — ABNORMAL LOW (ref 13.0–17.0)
Hemoglobin: 9 g/dL — ABNORMAL LOW (ref 13.0–17.0)
Immature Granulocytes: 0 %
Immature Granulocytes: 0 %
Lymphocytes Relative: 20 %
Lymphocytes Relative: 14 %
Lymphs Abs: 1.6 10*3/uL (ref 0.7–4.0)
Lymphs Abs: 1 10*3/uL (ref 0.7–4.0)
MCH: 33.3 pg (ref 26.0–34.0)
MCH: 33.1 pg (ref 26.0–34.0)
MCHC: 33.5 g/dL (ref 30.0–36.0)
MCHC: 33.3 g/dL (ref 30.0–36.0)
MCV: 99.6 fL (ref 80.0–100.0)
MCV: 99.3 fL (ref 80.0–100.0)
Monocytes Absolute: 0.8 10*3/uL (ref 0.1–1.0)
Monocytes Absolute: 0.7 10*3/uL (ref 0.1–1.0)
Monocytes Relative: 10 %
Monocytes Relative: 9 %
Neutro Abs: 4.7 10*3/uL (ref 1.7–7.7)
Neutro Abs: 5.2 10*3/uL (ref 1.7–7.7)
Neutrophils Relative %: 60 %
Neutrophils Relative %: 73 %
Platelets: 259 10*3/uL (ref 150–400)
Platelets: 275 10*3/uL (ref 150–400)
RBC: 2.64 MIL/uL — ABNORMAL LOW (ref 4.22–5.81)
RBC: 2.72 MIL/uL — ABNORMAL LOW (ref 4.22–5.81)
RDW: 13.4 % (ref 11.5–15.5)
RDW: 13.4 % (ref 11.5–15.5)
WBC: 7.8 10*3/uL (ref 4.0–10.5)
WBC: 7.2 10*3/uL (ref 4.0–10.5)
nRBC: 0 % (ref 0.0–0.2)
nRBC: 0 % (ref 0.0–0.2)

## 2020-07-01 LAB — POC SARS CORONAVIRUS 2 AG -  ED: SARS Coronavirus 2 Ag: NEGATIVE

## 2020-07-01 LAB — TSH: TSH: 1.695 u[IU]/mL (ref 0.350–4.500)

## 2020-07-01 IMAGING — CR DG CHEST 2V
2 series · 2 of 2 positions shown · non-contrast
Comparison: CT [DATE].

CLINICAL DATA: Shortness of breath.

EXAM:
CHEST - 2 VIEW

[chest lat]
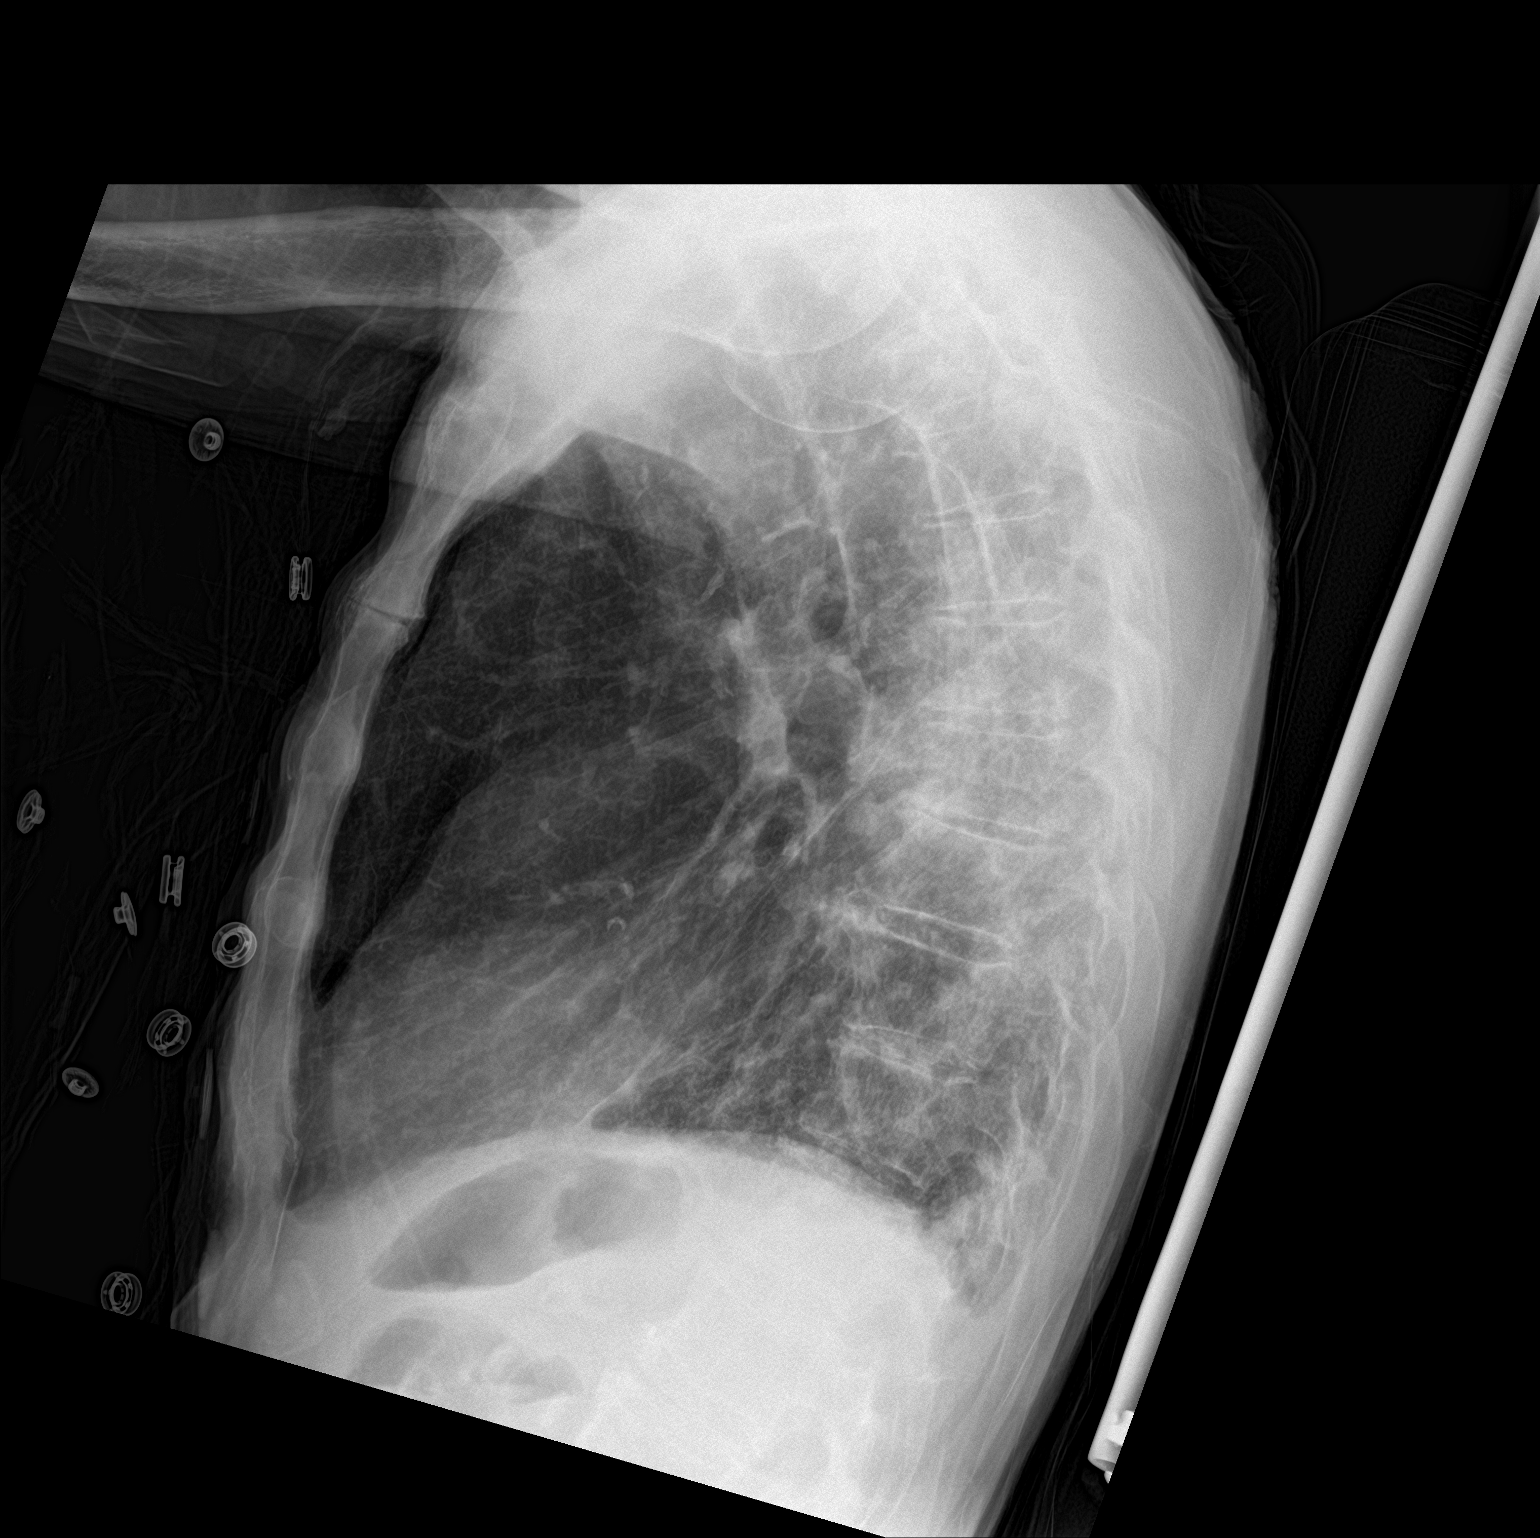

[chest ap]
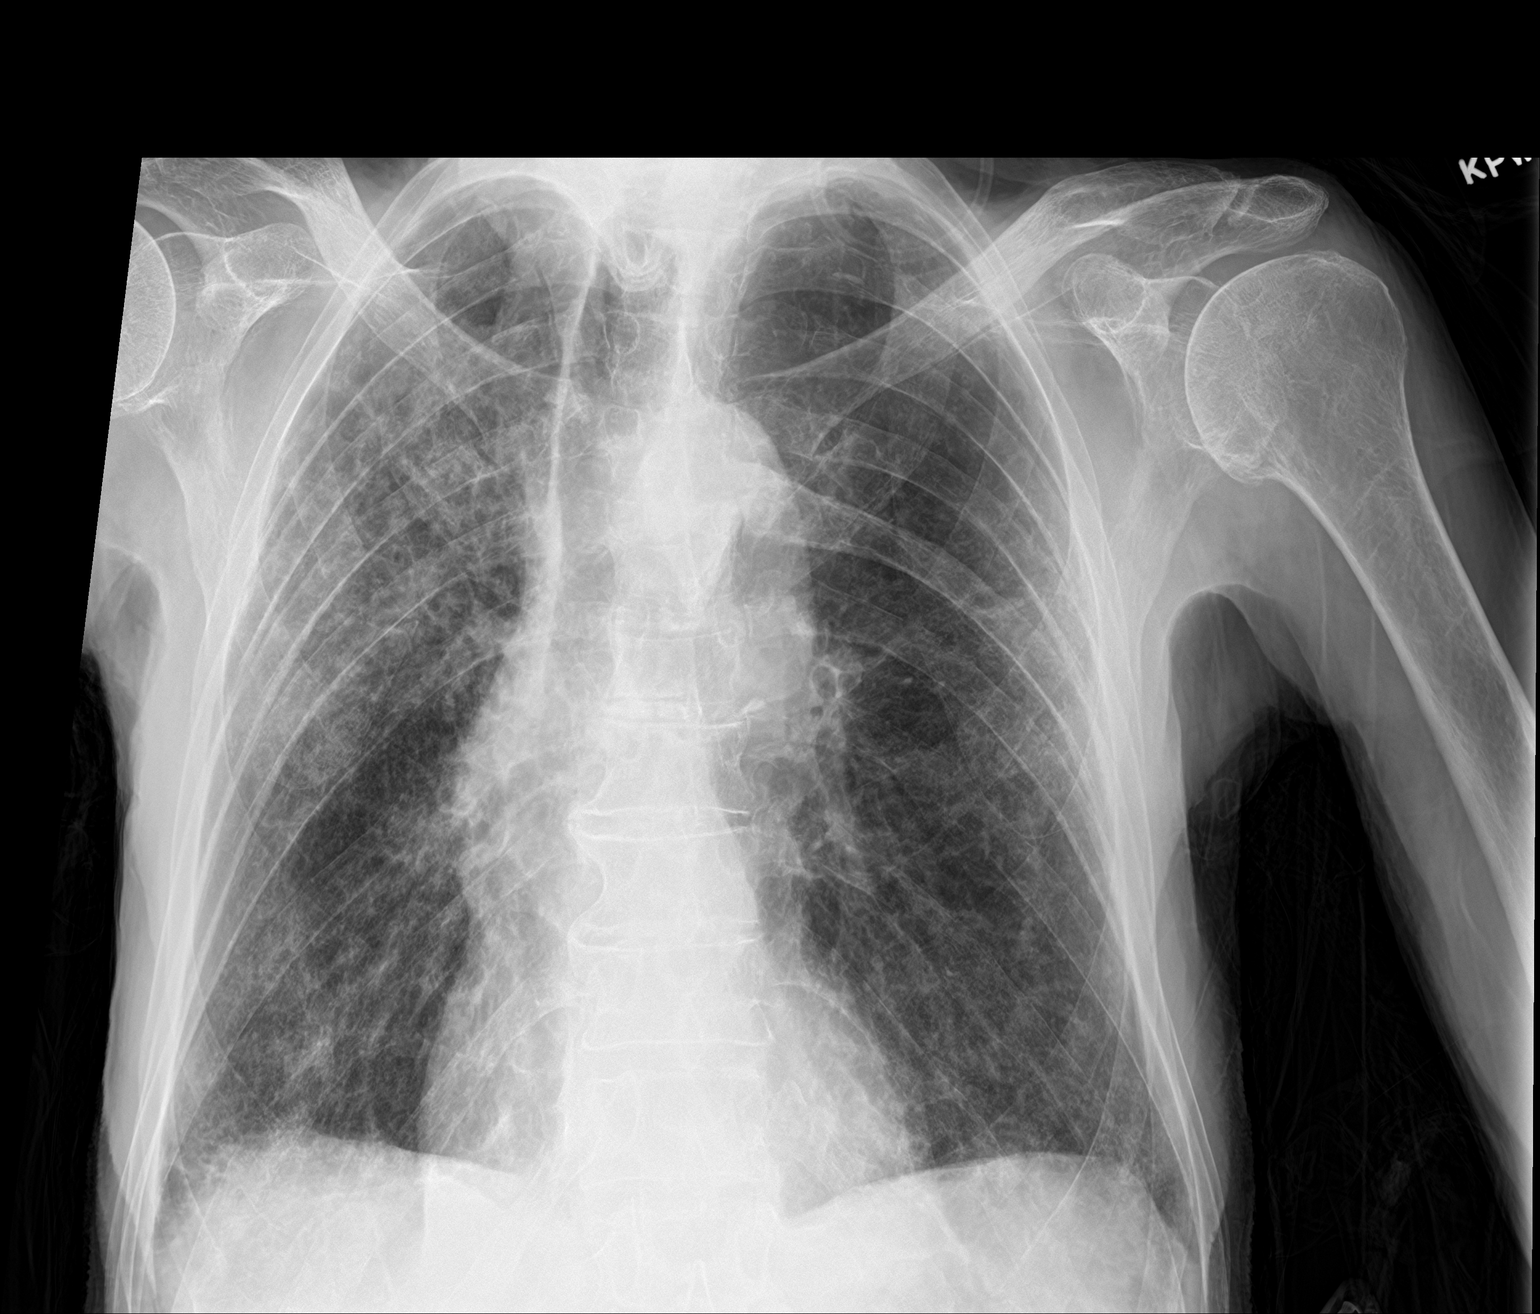

[2 of 2 positions shown; findings below may reference images not displayed]

FINDINGS: Mediastinum and hilar structures normal. Heart size normal. New
right upper lung prominent pulmonary infiltrate. Patchy right base
and left mid lung infiltrates again noted. No pleural effusion or
pneumothorax. Heart size normal. Diffuse osteopenia. Degenerative
change thoracic spine.
IMPRESSION: New right upper lung prominent pulmonary infiltrate. Patchy right
base and left mid lung infiltrates again noted.

## 2020-07-01 NOTE — ED Provider Notes (Signed)
[facility] Emergency Department Provider Note ____________________________________________   Event Date/Time   First MD Initiated Contact with Patient 07/01/20 1204     (approximate)  I have reviewed the triage vital signs and the nursing notes.  HISTORY  Chief Complaint Shortness of Breath   HPI Edward Cummings is a 87 y.o. malewho presents to the ED for evaluation of hypoxia.  Chart review indicates history of bladder cancer, CKD, iatrogenic hypothyroidism on Synthroid. Metastatic cancer.  Patient presents to the ED with his son for evaluation of generalized weakness and poor p.o. intake. Patient was reportedly next-door getting his routine infusion for his metastatic bladder cancer, noted to be hypoxic on finger pulse oximetry, and referred immediately to the ED.  Patient had cold fingers in triage, and pulse oximeter was checked on his earlobe, and remained 100% throughout triage process.  Here in the ED, patient reports he feels "okay."  Indicates a sensation of generalized weakness.  Denies abdominal pain, emesis, diarrhea, cough, shortness of breath, chest pain, syncope, headache or fever.  Son at the bedside indicates that he has been eating less and lost 2 to 3 pounds of weight.  Otherwise reports that he seems normal right now.   Past Medical History:  Diagnosis Date  . Anemia   . Anxiety 12/31/2019  . Cancer ([place])    bladder  . Chronic kidney disease   . Depression   . Hypertension   . Neuromuscular disorder ([place])    Nerve damage to left face/eye since around 2002.    Patient Active Problem List   Diagnosis Date Noted  . Stage 3a chronic kidney disease ([place]) 05/24/2020  . Cervicogenic headache 05/24/2020  . Tobacco abuse counseling 04/05/2020  . Urothelial carcinoma of bladder ([place]) 02/25/2020  . Cerumen debris on tympanic membrane of both ears 12/31/2019  . Seasonal allergic rhinitis due to pollen 12/31/2019  . Anxiety 12/31/2019   . Annual physical exam 07/25/2018  . Syncope 04/13/2018  . Cancer of overlapping sites of bladder ([place]) 02/04/2018  . Anemia due to stage 3 chronic kidney disease ([place]) 02/04/2018  . AKI (acute kidney injury) ([place]) 12/12/2017  . Hypertension 11/19/2013  . Fuchs' corneal dystrophy 10/10/2013    Past Surgical History:  Procedure Laterality Date  . CYSTOSCOPY W/ RETROGRADES Bilateral 01/25/2018   Procedure: CYSTOSCOPY WITH RETROGRADE PYELOGRAM;  [name], MD;  [name];  Service: Urology;  Laterality: Bilateral;  . CYSTOSCOPY W/ URETERAL STENT PLACEMENT Bilateral 01/06/2019   Procedure: CYSTOSCOPY WITH RETROGRADE PYELOGRAM/URETERAL STENT REMOVAL;  [name], MD;  [name];  Service: Urology;  Laterality: Bilateral;  . CYSTOSCOPY WITH STENT PLACEMENT Bilateral 01/25/2018   Procedure: CYSTOSCOPY WITH STENT PLACEMENT;  [name], MD;  [name];  Service: Urology;  Laterality: Bilateral;  . DORSAL SLIT N/A 01/06/2019   Procedure: DORSAL SLIT;  [name], MD;  [name];  Service: Urology;  [name];  . ESOPHAGOGASTRODUODENOSCOPY (EGD) WITH PROPOFOL N/A 11/12/2019   Procedure: ESOPHAGOGASTRODUODENOSCOPY (EGD) WITH PROPOFOL;  Surgeon: Virgel Manifold, MD;  Location: [facility] ENDOSCOPY;  Service: Endoscopy;  [name];  . EYE SURGERY     Cornea transplants bilaterally & cataract surgery.  . TRANSURETHRAL RESECTION OF BLADDER TUMOR N/A 01/25/2018   Procedure: TRANSURETHRAL RESECTION OF BLADDER TUMOR (TURBT);  [name], MD;  [name];  Service: Urology;  [name];    Prior to Admission medications  Medication Sig Start Date End Date Taking? Authorizing Provider  aspirin EC 81 MG tablet Take 81 mg by mouth daily.     [provider]  docusate sodium (COLACE) 50 MG capsule Take 50 mg by mouth at bedtime.    [provider]  fexofenadine  (ALLEGRA) 180 MG tablet Take 180 mg by mouth daily.     [provider]  fluticasone (FLONASE) 50 MCG/ACT nasal spray Place 2 sprays into both nostrils daily as needed for allergies or rhinitis. 02/27/20   [name], MD  levothyroxine (SYNTHROID) 88 MCG tablet Take 1 tablet (88 mcg total) by mouth daily before breakfast. Empty stomach- 1 hour prior to breakfast. 09/19/19   Cammie Sickle, MD  loratadine (CLARITIN) 10 MG tablet Take 1 tablet (10 mg total) by mouth daily. 02/27/20   [name], MD  Multiple Vitamin (MULTIVITAMIN WITH MINERALS) TABS tablet Take 1 tablet by mouth daily.     [provider]  oxybutynin (DITROPAN) 5 MG tablet TAKE 1 TABLET BY MOUTH 3 TIMES A DAY AS NEEDED 06/21/20   [name], MD  prednisoLONE acetate (PRED FORTE) 1 % ophthalmic suspension Place 1 drop into both eyes daily.    [provider]  tamsulosin (FLOMAX) 0.4 MG CAPS capsule Take 1 capsule (0.4 mg total) by mouth at bedtime. 09/05/18   Cammie Sickle, MD    Allergies Patient has no known allergies.  Family History  Problem Relation Age of Onset  . Prostate cancer Neg Hx   . Kidney cancer Neg Hx   . Bladder Cancer Neg Hx     Social History Social History   Tobacco Use  . Smoking status: Former Smoker    Types: Cigarettes  . Smokeless tobacco: Current User    Types: Chew  . Tobacco comment: Stopped approximately 10 years ago.  Vaping Use  . Vaping Use: Never used  Substance Use Topics  . Alcohol use: Not Currently    Alcohol/week: 2.0 standard drinks    Types: 2 Cans of beer per week    Comment: Daily  . Drug use: Never    Review of Systems  Constitutional: No fever/chills.  Positive generalized weakness Eyes: No visual changes. ENT: No sore throat. Cardiovascular: Denies chest pain. Respiratory: Denies shortness of breath. Gastrointestinal: No abdominal pain.  No nausea, no vomiting.  No diarrhea.  No constipation.  Positive for decreased  oral intake Genitourinary: Negative for dysuria. Musculoskeletal: Negative for back pain. Skin: Negative for rash. Neurological: Negative for headaches, focal weakness or numbness.  ____________________________________________   PHYSICAL EXAM:  VITAL SIGNS: Vitals:   07/01/20 1151 07/01/20 1236  BP: (!) 92/55   Pulse: 68   Resp: 16   Temp: 97.6 F (36.4 C)   SpO2: (!) 85% 99%     Constitutional: Alert and oriented. Well appearing and in no acute distress. Eyes: Conjunctivae are normal. PERRL. EOMI. Head: Atraumatic. Nose: No congestion/rhinnorhea. Mouth/Throat: Mucous membranes are moist.  Oropharynx non-erythematous. Neck: No stridor. No cervical spine tenderness to palpation. Cardiovascular: Normal rate, regular rhythm. Grossly normal heart sounds.  Good peripheral circulation. Respiratory: Normal respiratory effort.  No retractions. Lungs CTAB. Gastrointestinal: Soft , nondistended, nontender to palpation. No CVA tenderness. Musculoskeletal: No lower extremity tenderness nor edema.  No joint effusions. No signs of acute trauma. Neurologic:  Normal speech and language. No gross focal neurologic deficits are appreciated. No gait instability noted. Skin:  Skin is warm, dry and intact. No rash noted. Psychiatric: Mood and  affect are normal. Speech and behavior are normal.  ____________________________________________   LABS (all labs ordered are listed, but only abnormal results are displayed)  Labs Reviewed  CBC WITH DIFFERENTIAL/PLATELET - Abnormal; Notable for the following components:      Result Value   RBC 2.72 (*)    Hemoglobin 9.0 (*)    HCT 27.0 (*)    All other components within normal limits  COMPREHENSIVE METABOLIC PANEL - Abnormal; Notable for the following components:   CO2 21 (*)    BUN 52 (*)    Creatinine, Ser 2.37 (*)    Albumin 2.9 (*)    AST 44 (*)    GFR, Estimated 26 (*)    All other components within normal limits  SARS CORONAVIRUS 2 (TAT  6-24 HRS)  POC SARS CORONAVIRUS 2 AG -  ED   ____________________________________________  12 Lead EKG  Sinus rhythm, rate of 74 bpm.  Normal axis and intervals.  No evidence of acute ischemia. ____________________________________________  RADIOLOGY  ED MD interpretation: 2 view CXR reviewed by me with patchy multifocal infiltrates without discrete lobar filtration.  Official radiology report(s): DG Chest 2 View  Result Date: 07/01/2020 CLINICAL DATA:  Shortness of breath. EXAM: CHEST - 2 VIEW COMPARISON:  CT 04/12/2020. FINDINGS: Mediastinum and hilar structures normal. Heart size normal. New right upper lung prominent pulmonary infiltrate. Patchy right base and left mid lung infiltrates again noted. No pleural effusion or pneumothorax. Heart size normal. Diffuse osteopenia. Degenerative change thoracic spine. IMPRESSION: New right upper lung prominent pulmonary infiltrate. Patchy right base and left mid lung infiltrates again noted. Electronically Signed   By: Marcello Moores  Register   On: 07/01/2020 09:48    ____________________________________________   PROCEDURES and INTERVENTIONS  Procedure(s) performed (including Critical Care):  .1-3 Lead EKG Interpretation Performed by: Vladimir Crofts, MD Authorized by: Vladimir Crofts, MD     Interpretation: normal     ECG rate:  86   ECG rate assessment: normal     Rhythm: sinus rhythm     Ectopy: none     Conduction: normal      Medications - No data to display  ____________________________________________   MDM / ED COURSE   Vaccinated 87 year old man presents to the ED with concerns for hypoxia, without evidence of such, likely due to COVID-19 and ultimately amenable to outpatient management.  Normal vitals on room air throughout his 5-hour observation period in the ED.  No evidence of acute pathology on examination.  He is in no acute distress, is no evidence of trauma or neurovascular deficits.  Blood work shows CKD at baseline and  no evidence of acute pathology.  CXR with patchy opacities concerning for COVID-19, considering his generalized symptoms of poor p.o. intake and generalized weakness.  He has no evidence of hypoxia, shortness of breath or distress to preclude outpatient management.  He is tolerating p.o. intake.  He lives at home with family and has multiple sons who help care for him.  I discussed with son at the bedside that there is no indication for hospitalization at this time, and I provided recommendations to assist as an outpatient.  We discussed return precautions for the ED.  Patient medically stable for discharge home.   Clinical Course as of 07/01/20 1436  Thu Jul 01, 2020  1320 Patient remains 100% on room air on continuous pulse oximetry. [DS]    Clinical Course User Index [DS] Vladimir Crofts, MD    ____________________________________________   FINAL CLINICAL IMPRESSION(S) /  ED DIAGNOSES  Final diagnoses:  Clinical diagnosis of COVID-19  Generalized weakness     ED Discharge Orders    None       Kden Wagster Tamala Julian   Note:  This document was prepared using Dragon voice recognition software and may include unintentional dictation errors.   Vladimir Crofts, MD 07/01/20 [phone]

## 2020-07-01 NOTE — Progress Notes (Signed)
The pt was not seen in the clinic-because of unstable vital signs.  Rolled over to the emergency room.  Patient's son Ronalee Belts was updated by [employer].  Follow-up to be decided

## 2020-07-01 NOTE — Progress Notes (Unsigned)
Patient arrived to cancer center with unstable gait.  Patient scheduled for opdivo and IV venofer today.  Patient dizzy and very pale. bp 87/54 upon arrival. sats at room air at 57%. Pt c/o shortness of breath and weakness. Per v/o Dr. Rogue Bussing - patient placed on 3L of oxygen. After 5 mins, sats only rose to 70%. Patient placed on 4L of oxygen and sats improved to 78%. BP 99/53. HR95. Patient denies any chest pain.  Per v/o Dr. Rogue Bussing - transport patient asap to ER via w/c.

## 2020-07-01 NOTE — ED Notes (Signed)
Spoke with Dr Tamala Julian about pts presentation. Orders received for EKG and Chest xray.

## 2020-07-01 NOTE — ED Triage Notes (Signed)
Pt brought over from the cancer center stating that pt was hypoxic and that his sats were 70% on RA, pt placed on 4 L [place] when he arrived and he was 100%. Pt denies sob. States that he feels fine.

## 2020-07-01 NOTE — Discharge Instructions (Signed)
As we discussed, your father likely has COVID-19.  With his symptoms of generalized weakness, poor appetite and oral intake, and with the changes on his chest x-ray, I am most concerned about COVID. As we discussed, he does not need hospitalization at this time.  Use Tylenol for pain and fevers.  Up to 1000 mg per dose, up to 4 times per day.  Do not take more than 4000 mg of Tylenol/acetaminophen within 24 hours..  I would urge you to use Tylenol regularly, even if he does not have a fever, per the dosing schedule above. Push fluids to maintain his hydration, use soups, broths and milkshakes.  If he is getting worse despite these interventions, please return to the ED.

## 2020-07-02 ENCOUNTER — Telehealth: Payer: Self-pay | Admitting: Internal Medicine

## 2020-07-02 ENCOUNTER — Inpatient Hospital Stay: Payer: Medicare HMO

## 2020-07-02 LAB — SARS CORONAVIRUS 2 (TAT 6-24 HRS): SARS Coronavirus 2: NEGATIVE

## 2020-07-02 NOTE — Telephone Encounter (Signed)
On 1/20 -I spoke to son Ronalee Belts regarding patient's recent evaluation in the clinic/emergency room.  Patient clinically doing stable/better. Awaiting repeat COVID testing. We will make appointment for the patient based upon the results of the COVID testing.  H/T-please inform patient/son that COVID test is negative.   C-schedule-in 1 week-MD labs CBC CMP; Opdivo. GB

## 2020-07-09 ENCOUNTER — Ambulatory Visit (HOSPITAL_COMMUNITY)
Admission: RE | Admit: 2020-07-09 | Discharge: 2020-07-09 | Disposition: A | Discharge status: Home / Self Care | Payer: Medicare HMO | Source: Ambulatory Visit | Attending: Internal Medicine | Admitting: Internal Medicine

## 2020-07-09 ENCOUNTER — Other Ambulatory Visit: Payer: Self-pay

## 2020-07-09 ENCOUNTER — Other Ambulatory Visit: Payer: Self-pay | Admitting: Internal Medicine

## 2020-07-09 ENCOUNTER — Inpatient Hospital Stay: Payer: Medicare HMO

## 2020-07-09 ENCOUNTER — Encounter: Payer: Self-pay | Admitting: Internal Medicine

## 2020-07-09 ENCOUNTER — Inpatient Hospital Stay (HOSPITAL_BASED_OUTPATIENT_CLINIC_OR_DEPARTMENT_OTHER): Payer: Medicare HMO | Admitting: Internal Medicine

## 2020-07-09 ENCOUNTER — Other Ambulatory Visit: Payer: Self-pay | Admitting: *Deleted

## 2020-07-09 VITALS — BP 92/70 | HR 78 | Temp 97.1°F | Resp 16 | Ht 69.0 in | Wt 110.6 lb

## 2020-07-09 VITALS — BP 139/61 | HR 67

## 2020-07-09 DIAGNOSIS — R2681 Unsteadiness on feet: Secondary | ICD-10-CM

## 2020-07-09 DIAGNOSIS — H748X3 Other specified disorders of middle ear and mastoid, bilateral: Secondary | ICD-10-CM | POA: Diagnosis not present

## 2020-07-09 DIAGNOSIS — E860 Dehydration: Secondary | ICD-10-CM

## 2020-07-09 DIAGNOSIS — Z87891 Personal history of nicotine dependence: Secondary | ICD-10-CM | POA: Diagnosis not present

## 2020-07-09 DIAGNOSIS — G319 Degenerative disease of nervous system, unspecified: Secondary | ICD-10-CM | POA: Diagnosis not present

## 2020-07-09 DIAGNOSIS — D631 Anemia in chronic kidney disease: Secondary | ICD-10-CM | POA: Diagnosis not present

## 2020-07-09 DIAGNOSIS — R42 Dizziness and giddiness: Secondary | ICD-10-CM | POA: Diagnosis not present

## 2020-07-09 DIAGNOSIS — I959 Hypotension, unspecified: Secondary | ICD-10-CM

## 2020-07-09 DIAGNOSIS — I129 Hypertensive chronic kidney disease with stage 1 through stage 4 chronic kidney disease, or unspecified chronic kidney disease: Secondary | ICD-10-CM | POA: Diagnosis not present

## 2020-07-09 DIAGNOSIS — E032 Hypothyroidism due to medicaments and other exogenous substances: Secondary | ICD-10-CM | POA: Diagnosis not present

## 2020-07-09 DIAGNOSIS — C678 Malignant neoplasm of overlapping sites of bladder: Secondary | ICD-10-CM

## 2020-07-09 DIAGNOSIS — N184 Chronic kidney disease, stage 4 (severe): Secondary | ICD-10-CM | POA: Diagnosis not present

## 2020-07-09 DIAGNOSIS — C772 Secondary and unspecified malignant neoplasm of intra-abdominal lymph nodes: Secondary | ICD-10-CM | POA: Diagnosis not present

## 2020-07-09 DIAGNOSIS — J0100 Acute maxillary sinusitis, unspecified: Secondary | ICD-10-CM | POA: Diagnosis not present

## 2020-07-09 DIAGNOSIS — E2740 Unspecified adrenocortical insufficiency: Secondary | ICD-10-CM | POA: Diagnosis not present

## 2020-07-09 DIAGNOSIS — Z5112 Encounter for antineoplastic immunotherapy: Secondary | ICD-10-CM | POA: Diagnosis not present

## 2020-07-09 LAB — CBC WITH DIFFERENTIAL/PLATELET
Abs Immature Granulocytes: 0.03 10*3/uL (ref 0.00–0.07)
Basophils Absolute: 0.1 10*3/uL (ref 0.0–0.1)
Basophils Relative: 1 %
Eosinophils Absolute: 0.5 10*3/uL (ref 0.0–0.5)
Eosinophils Relative: 7 %
HCT: 28.5 % — ABNORMAL LOW (ref 39.0–52.0)
Hemoglobin: 9.1 g/dL — ABNORMAL LOW (ref 13.0–17.0)
Immature Granulocytes: 0 %
Lymphocytes Relative: 21 %
Lymphs Abs: 1.5 10*3/uL (ref 0.7–4.0)
MCH: 32.5 pg (ref 26.0–34.0)
MCHC: 31.9 g/dL (ref 30.0–36.0)
MCV: 101.8 fL — ABNORMAL HIGH (ref 80.0–100.0)
Monocytes Absolute: 0.7 10*3/uL (ref 0.1–1.0)
Monocytes Relative: 10 %
Neutro Abs: 4.4 10*3/uL (ref 1.7–7.7)
Neutrophils Relative %: 61 %
Platelets: 541 10*3/uL — ABNORMAL HIGH (ref 150–400)
RBC: 2.8 MIL/uL — ABNORMAL LOW (ref 4.22–5.81)
RDW: 13.4 % (ref 11.5–15.5)
WBC: 7.3 10*3/uL (ref 4.0–10.5)
nRBC: 0 % (ref 0.0–0.2)

## 2020-07-09 LAB — COMPREHENSIVE METABOLIC PANEL
ALT: 48 U/L — ABNORMAL HIGH (ref 0–44)
AST: 48 U/L — ABNORMAL HIGH (ref 15–41)
Albumin: 2.7 g/dL — ABNORMAL LOW (ref 3.5–5.0)
Alkaline Phosphatase: 148 U/L — ABNORMAL HIGH (ref 38–126)
Anion gap: 10 (ref 5–15)
BUN: 40 mg/dL — ABNORMAL HIGH (ref 8–23)
CO2: 21 mmol/L — ABNORMAL LOW (ref 22–32)
Calcium: 9 mg/dL (ref 8.9–10.3)
Chloride: 106 mmol/L (ref 98–111)
Creatinine, Ser: 1.88 mg/dL — ABNORMAL HIGH (ref 0.61–1.24)
GFR, Estimated: 35 mL/min — ABNORMAL LOW (ref >60)
Glucose, Bld: 84 mg/dL (ref 70–99)
Potassium: 5.2 mmol/L — ABNORMAL HIGH (ref 3.5–5.1)
Sodium: 137 mmol/L (ref 135–145)
Total Bilirubin: 0.6 mg/dL (ref 0.3–1.2)
Total Protein: 6.5 g/dL (ref 6.5–8.1)

## 2020-07-09 LAB — CORTISOL: Cortisol, Plasma: 12.9 ug/dL

## 2020-07-09 IMAGING — MR MR HEAD WO/W CM
15 series · 47 of 48 positions shown · IV contrast (gadavist)
Comparison: Head CT [DATE]. Head CT [DATE]. PET-CT
[DATE].

CLINICAL DATA: Cancer of overlapping sites of bladder. Gait
instability. Dizziness, persistent/recurrent cardiac or vascular
cause suspected. Additional history provided by scanning
technologist: Patient with history of metastatic transitional
carcinoma of the bladder, gait unsteady, dizzy spells.

EXAM:
MRI HEAD WITHOUT AND WITH CONTRAST
TECHNIQUE: Multiplanar, multiecho pulse sequences of the brain and surrounding
structures were obtained without and with intravenous contrast.
CONTRAST:  5mL GADAVIST GADOBUTROL 1 MMOL/ML IV SOLN

[Series 5: ax dwi_tracew · axial · 3.0mm · 0.60mm/px · z∈[-57,+97]mm · 4 of 48 slices shown]
[im 1/48]
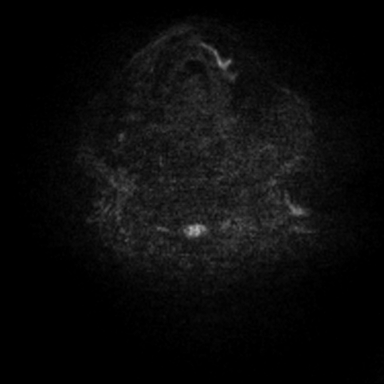
[im 16/48]
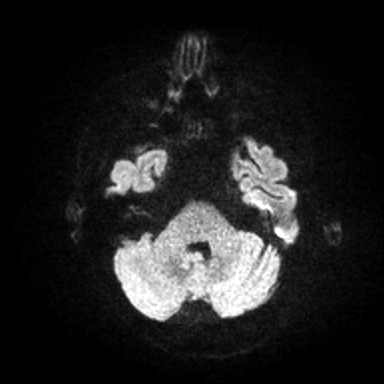
[im 32/48]
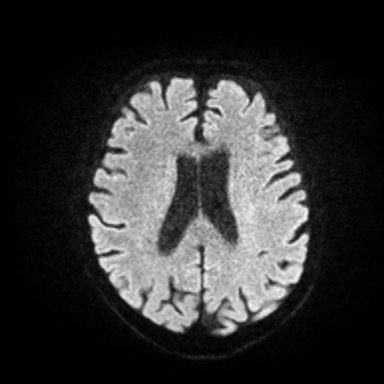
[im 48/48]
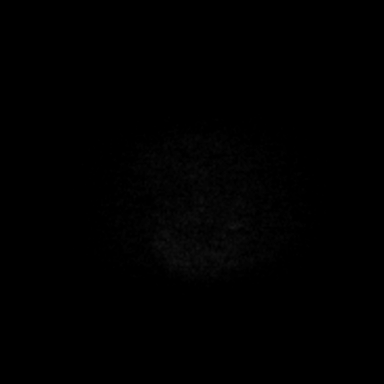

[Series 6: ax dwi_adc · axial · 3.0mm · 0.60mm/px · z∈[-57,+94]mm · 3 of 47 slices shown]
[im 1/47]
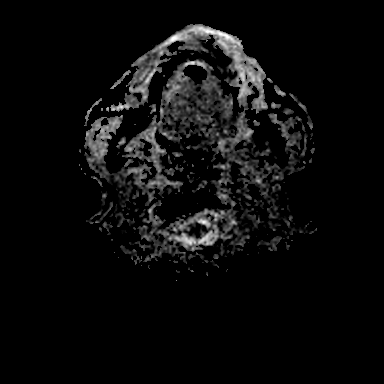
[im 24/47]
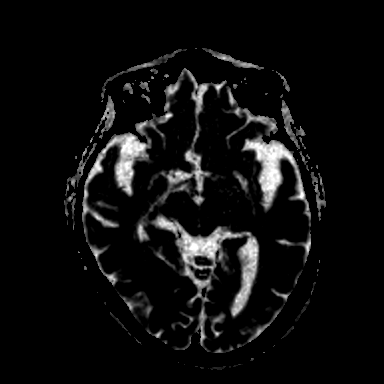
[im 47/47]
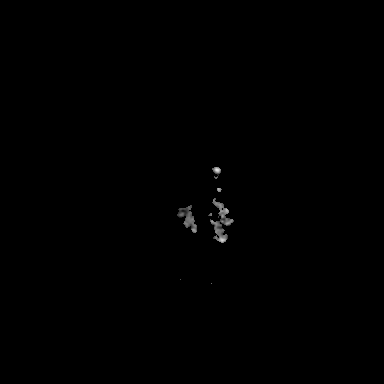

[Series 7: cor dwi_tracew · coronal · 5.0mm · 0.68mm/px · 2 of 38 slices shown]
[im 1/38]
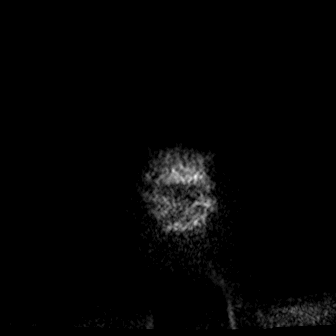
[im 38/38]
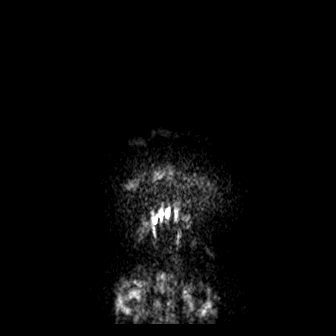

[Series 8: cor dwi_adc · coronal · 5.0mm · 0.68mm/px · 2 of 38 slices shown]
[im 1/38]
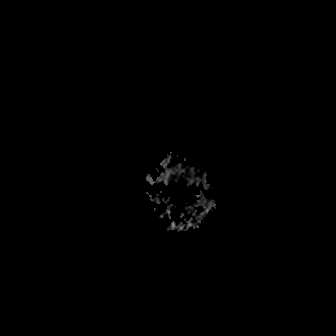
[im 38/38]
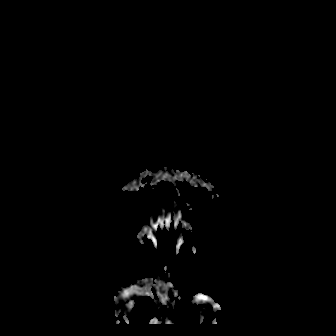

[Series 9: T1 · sagittal · 5.0mm · 0.62mm/px · 1 of 22 slices shown (1 of 2)]
[im 1/22]
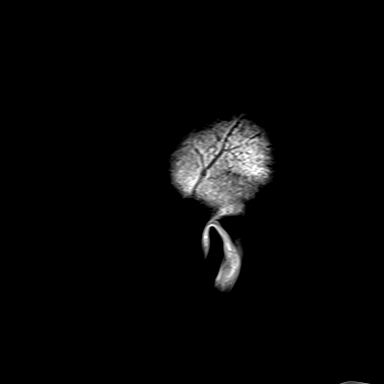

[Series 10: T2 · axial · 5.0mm · 0.53mm/px · 1 of 25 slices shown]
[im 1/25]
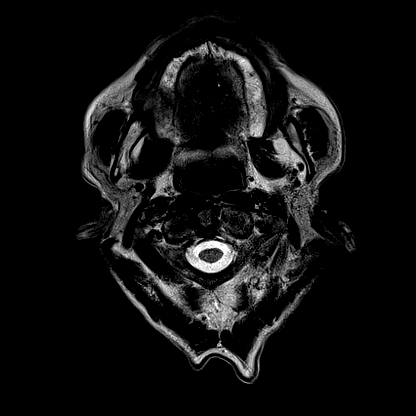

[Series 11: mag_images · axial · 3.0mm · 0.90mm/px · z∈[-69,+107]mm · 4 of 60 slices shown]
[im 1/60]
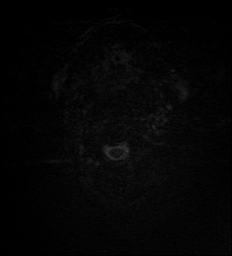
[im 20/60]
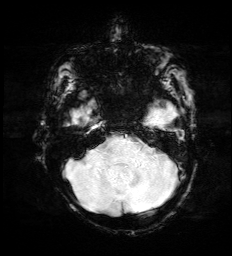
[im 40/60]
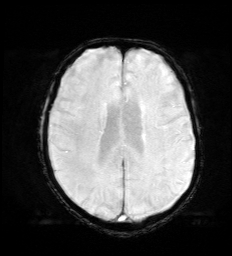
[im 60/60]
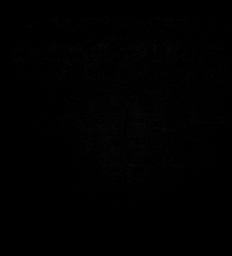

[Series 12: pha_images · axial · 3.0mm · 0.90mm/px · z∈[-66,+104]mm · 3 of 58 slices shown]
[im 1/58]
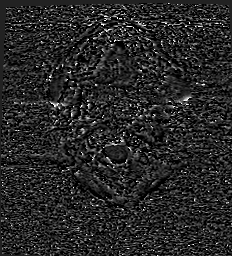
[im 29/58]
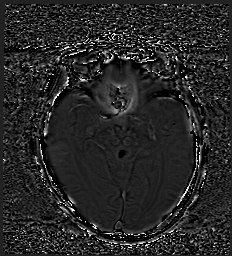
[im 58/58]
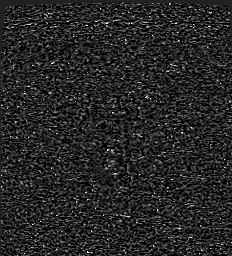

[Series 13: swi_images · axial · 3.0mm · 0.90mm/px · z∈[-69,+47]mm · 3 of 60 slices shown]
[im 1/60]
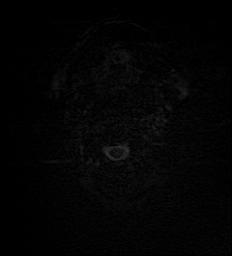
[im 20/60]
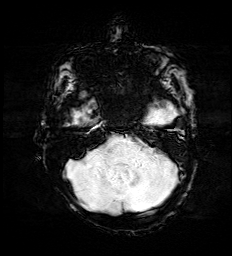
[im 40/60]
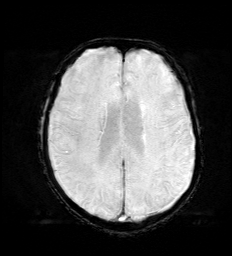

[Series 15: FLAIR · axial · 3.0mm · 0.53mm/px · z∈[-61,+100]mm · 3 of 55 slices shown]
[im 1/55]
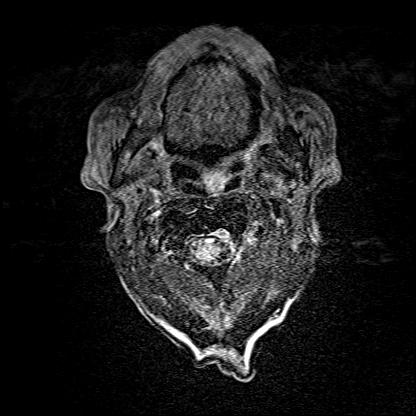
[im 28/55]
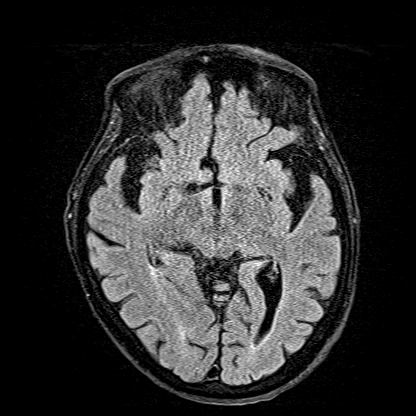
[im 55/55]
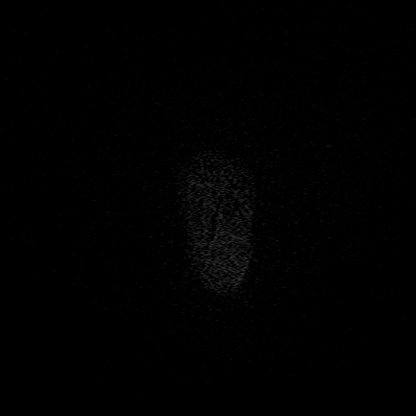

[Series 16: T1 · axial · 1.0mm · 0.98mm/px · z∈[-52,+91]mm · 8 of 144 slices shown (2 of 2)]
[im 1/144]
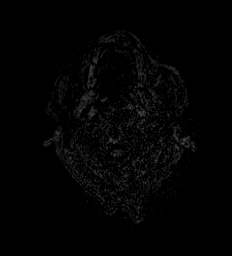
[im 21/144]
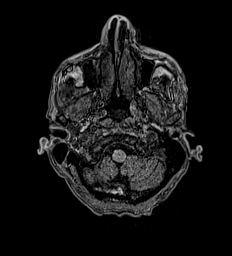
[im 41/144]
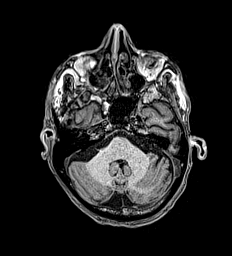
[im 62/144]
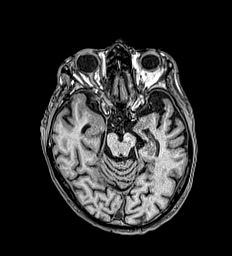
[im 82/144]
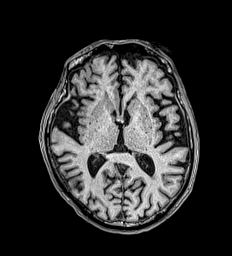
[im 103/144]
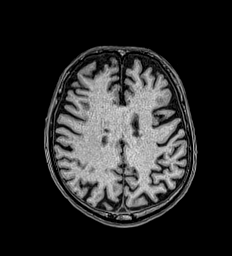
[im 123/144]
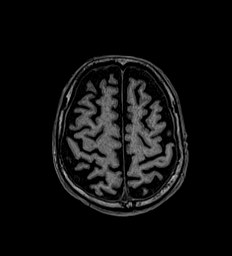
[im 144/144]
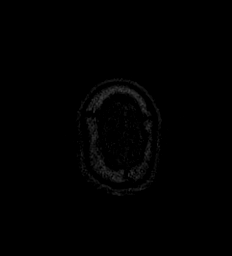

[Series 17: T2 post-contrast · coronal · 5.0mm · 0.57mm/px · 2 of 29 slices shown]
[im 1/29]
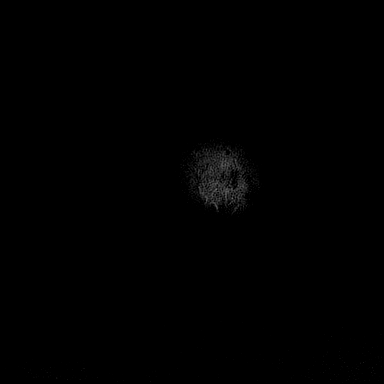
[im 29/29]
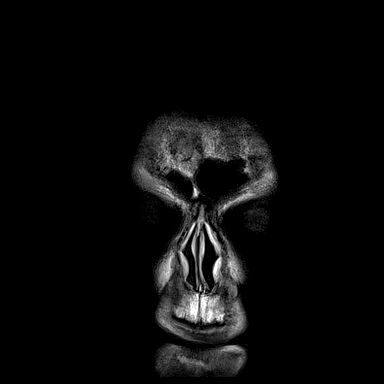

[Series 18: T1 post-contrast · axial · 1.0mm · 0.98mm/px · z∈[-52,+91]mm · 8 of 144 slices shown (1 of 3)]
[im 1/144]
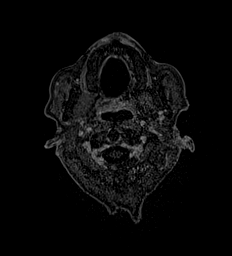
[im 21/144]
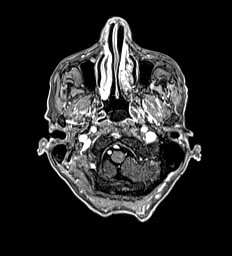
[im 41/144]
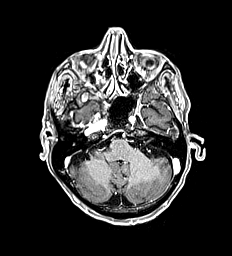
[im 62/144]
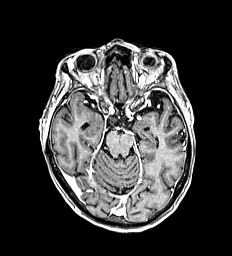
[im 82/144]
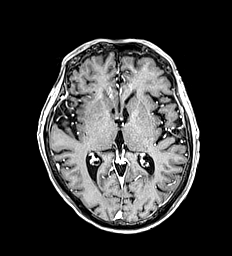
[im 103/144]
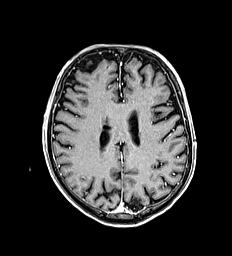
[im 123/144]
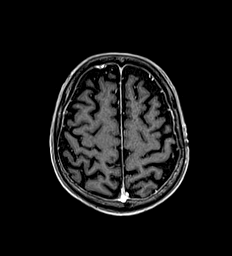
[im 144/144]
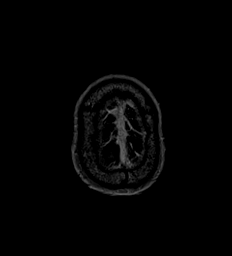

[Series 19: T1 post-contrast · coronal · 5.0mm · 0.57mm/px · 2 of 29 slices shown (2 of 3)]
[im 1/29]
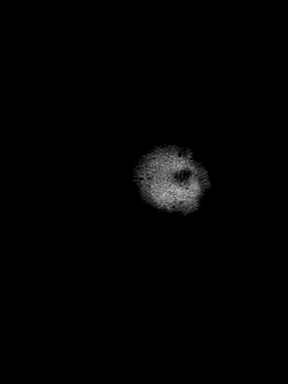
[im 29/29]
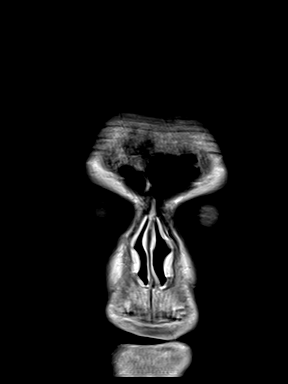

[Series 20: T1 post-contrast · sagittal · 5.0mm · 0.62mm/px · 1 of 22 slices shown (3 of 3)]
[im 1/22]
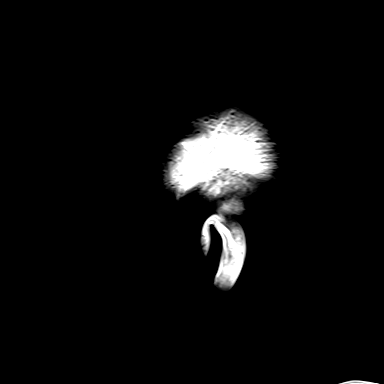

[47 of 48 positions shown; findings below may reference images not displayed]

FINDINGS: Brain:

Mild-to-moderate cerebral atrophy. Comparatively mild cerebellar
atrophy.

No significant white matter disease for age.

A 2 mm focus of enhancement along the anterior right frontal lobe is
favored vascular (series 18, image 78). Elsewhere, no abnormal
intracranial enhancement is identified.

There is no acute infarct.

No chronic intracranial blood products.

No extra-axial fluid collection.

No midline shift.

Vascular: Expected proximal arterial flow voids.

Skull and upper cervical spine: No focal marrow lesion. Incompletely
assessed cervical spondylosis.

Sinuses/Orbits: Visualized orbits show no acute finding. Mild
bilateral ethmoid and maxillary sinus mucosal thickening. Trace
fluid within the bilateral mastoid air cells.

Other: New from the prior head CT of [DATE], there is a 2.0 x
1.2 x 1.8 cm peripherally enhancing cystic lesion within the midline
posterior nasopharynx (series 10, image 4) (series 20, image 11).
IMPRESSION: A 2 mm enhancing focus along the anterior right frontal lobe is
likely vascular enhancement. However, 4-6 month contrast-enhanced
MRI follow-up is recommended to ensure stability and definitively
exclude a tiny metastasis.

Otherwise, there is no evidence of intracranial metastatic disease.

No evidence of acute or recent subacute infarction.

Mild-to-moderate cerebral atrophy with comparatively mild cerebellar
atrophy.

2 cm cystic lesion within the midline posterior nasopharynx, new as
compared to the head CT of [DATE]. This may reflect a mucous
retention cyst, but remains indeterminate. Attention recommended on
follow-up. Additionally, consider ENT consultation.

## 2020-07-09 MED ORDER — GADOBUTROL 1 MMOL/ML IV SOLN
5.0000 mL | Freq: Once | INTRAVENOUS | Status: AC | PRN
  Administered 2020-07-09: 5 mL via INTRAVENOUS

## 2020-07-09 MED ORDER — SODIUM CHLORIDE 0.9 % IV SOLN
INTRAVENOUS | Status: DC
  Filled 2020-07-09 (×2): qty 250

## 2020-07-09 MED ORDER — DEXAMETHASONE SODIUM PHOSPHATE 10 MG/ML IJ SOLN
10.0000 mg | Freq: Once | INTRAMUSCULAR | Status: AC
  Administered 2020-07-09: 10 mg via INTRAVENOUS
  Filled 2020-07-09: qty 1

## 2020-07-09 MED ORDER — PREDNISONE 20 MG PO TABS
ORAL_TABLET | ORAL | 0 refills | Status: DC
Start: 2020-07-09 — End: 2020-07-23

## 2020-07-09 NOTE — Progress Notes (Signed)
Pt came back very unbalanced. Had to hold on to his arm when he walked. Expresses some lightheaded. Went to the ED a couple weeks ago due to low O2 level per son.

## 2020-07-09 NOTE — Progress Notes (Signed)
[place] NOTE  Patient Care Team: [name], [name], [name], [name]; [name]; [name]; mediastinal uptake; right pubic rami uptake.  # [name]; Jan 18th 2021- switched to [facility]- [pt preference; [name]; July 2020 cystoscopy-no evidence of bladder malignancy/Dr. Bernardo Heater- enlarged prostate [PSA- 0.95; 2021]  # Molecular testing- PDL-1 CPS- 20%; [name]  ;GOALS: palliative  CURRENT/MOST RECENT THERAPY:OPDIVO [C]     Cancer of overlapping sites of bladder ([name])  02/28/2018 - 06/09/2019 Chemotherapy   The patient had atezolizumab (TECENTRIQ) 1,[name], 1,200 mg, Intravenous, Once, [name],200 mg (02/28/2018), 1,200 mg (03/21/2018), 1,200 mg (04/11/2018), 1,200 mg (05/02/2018), 1,200 mg (06/13/2018), 1,200 mg (05/23/2018), 1,200 mg (07/04/2018), 1,200 mg (07/25/2018), 1,200 mg (08/15/2018), 1,200 mg (09/05/2018), 1,200 mg (10/25/2018), 1,200 mg (11/22/2018), 1,200 mg (12/20/2018), 1,200 mg (01/10/2019), 1,200 mg (01/31/2019), 1,200 mg (02/21/2019), 1,200 mg (03/14/2019), 1,200 mg (04/04/2019), 1,200 mg (04/25/2019), 1,200 mg (05/16/2019), 1,200 mg (06/09/2019)  for chemotherapy treatment.    06/30/2019 -  Chemotherapy   The patient had nivolumab  (OPDIVO) 240 mg in sodium chloride 0.9 % 100 mL chemo infusion, 240 mg, Intravenous, Once, 22 of 23 cycles Administration: 240 mg (06/30/2019), 240 mg (07/14/2019), 240 mg (07/28/2019), 240 mg (08/11/2019), 240 mg (08/25/2019), 240 mg (09/08/2019), 240 mg (09/22/2019), 240 mg (11/03/2019), 240 mg (11/17/2019), 240 mg (12/10/2019), 240 mg (12/24/2019), 240 mg (01/08/2020), 240 mg (01/22/2020), 240 mg (02/05/2020), 240 mg (02/19/2020), 240 mg (03/04/2020), 240 mg (03/18/2020), 240 mg (04/01/2020), 240 mg (04/29/2020), 240 mg (05/13/2020), 240 mg (05/27/2020), 240 mg (06/17/2020)  for chemotherapy treatment.     HISTORY OF PRESENTING ILLNESS: Edward Cummings 87 y.o.  male with metastatic transitional carcinoma of the bladder currently on Opdivo is here for follow-up.  Patient did not get his treatment last week as he was sent to the ER given "low blood pressures/low O2 saturations".  However when checked in the emergency room showed stable blood pressures O2 saturations.  Chest x-ray stable infiltrates noted.  As per the family patient not having fevers.  Denies any nausea vomiting.  No diarrhea.  Admits to poor appetite.  Weight loss of 8 pounds in the last 2 weeks.  Gait is unsteady.  Dizzy spells.   Review of Systems  Constitutional: Positive for malaise/fatigue and weight loss. Negative for chills, diaphoresis and fever.  HENT: Negative for nosebleeds and sore throat.   Eyes: Negative for double vision.  Respiratory: Negative for hemoptysis, sputum production and wheezing.   Cardiovascular: Negative for chest pain, palpitations, orthopnea and leg swelling.  Gastrointestinal: Negative for abdominal pain, blood in stool, diarrhea, heartburn, melena, nausea and vomiting.  Genitourinary: Negative for dysuria.  Musculoskeletal: Positive for back pain and joint pain.  Skin: Negative.  Negative for itching and rash.  Neurological: Positive for dizziness and weakness. Negative for tingling, focal weakness  and headaches.   Endo/Heme/Allergies: Does not bruise/bleed easily.  Psychiatric/Behavioral: Negative for depression. The patient is not nervous/anxious and does not have insomnia.      MEDICAL HISTORY:  Past Medical History:  Diagnosis Date  . Anemia   . Anxiety 12/31/2019  . Cancer ([place])    bladder  . Chronic kidney disease   . Depression   . Hypertension   . Neuromuscular disorder ([place])    Nerve damage to left face/eye since around July 15, 2000.    SURGICAL HISTORY: Past Surgical History:  Procedure Laterality Date  . CYSTOSCOPY W/ RETROGRADES Bilateral 01/25/2018   Procedure: CYSTOSCOPY WITH RETROGRADE PYELOGRAM;  [name], MD;  [name];  Service: Urology;  Laterality: Bilateral;  . CYSTOSCOPY W/ URETERAL STENT PLACEMENT Bilateral 01/06/2019   Procedure: CYSTOSCOPY WITH RETROGRADE PYELOGRAM/URETERAL STENT REMOVAL;  [name], MD;  [name];  Service: Urology;  Laterality: Bilateral;  . CYSTOSCOPY WITH STENT PLACEMENT Bilateral 01/25/2018   Procedure: CYSTOSCOPY WITH STENT PLACEMENT;  [name], MD;  [name];  Service: Urology;  Laterality: Bilateral;  . DORSAL SLIT N/A 01/06/2019   Procedure: DORSAL SLIT;  [name], MD;  [name];  Service: Urology;  [name];  . ESOPHAGOGASTRODUODENOSCOPY (EGD) WITH PROPOFOL N/A 11/12/2019   Procedure: ESOPHAGOGASTRODUODENOSCOPY (EGD) WITH PROPOFOL;  Surgeon: Virgel Manifold, MD;  Location: [facility] END[facility]COPY;  Service: Endoscopy;  [name];  . EYE SURGERY     Cornea transplants bilaterally & cataract surgery.  . TRANSURETHRAL RESECTION OF BLADDER TUMOR N/A 01/25/2018   Procedure: TRANSURETHRAL RESECTION OF BLADDER TUMOR (TURBT);  [name], MD;  [name];  Service: Urology;  [name];    SOCIAL HISTORY: Social History   Socioeconomic History  . Marital status: Married    Spouse name: [name]  . Number of  children: Not on file  . Years of education: Not on file  . Highest education level: Not on file  Occupational History  . Not on file  Tobacco Use  . Smoking status: Former Smoker    Types: Cigarettes  . Smokeless tobacco: Current User    Types: Chew  . Tobacco comment: Stopped approximately 10 years ago.  Vaping Use  . Vaping Use: Never used  Substance and Sexual Activity  . Alcohol use: Not Currently    Alcohol/week: 2.0 standard drinks    Types: 2 Cans of beer per week    Comment: Daily  . Drug use: Never  . Sexual activity: Yes    Birth control/protection: None  Other Topics Concern  . Not on file  Social History Narrative    lives in [place]; with son. Wife died in 2018/07/15.  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     Social Determinants of Health   Financial Resource Strain: Not on file  Food Insecurity: Not on file  Transportation Needs: Not on file  Physical Activity: Not on file  Stress: Not on file  Social Connections: Not on file  Intimate Partner Violence: Not on file    FAMILY HISTORY: Family History  Problem Relation Age of Onset  . Prostate cancer Neg Hx   . Kidney cancer Neg Hx   . Bladder Cancer Neg Hx     ALLERGIES:  has No Known Allergies.  MEDICATIONS:  Current Outpatient Medications  Medication Sig Dispense Refill  . aspirin EC 81 MG tablet Take 81 mg by mouth daily.     Marland Kitchen  docusate sodium (COLACE) 50 MG capsule Take 50 mg by mouth at bedtime.    . fexofenadine (ALLEGRA) 180 MG tablet Take 180 mg by mouth daily.     . fluticasone (FLONASE) 50 MCG/ACT nasal spray Place 2 sprays into both nostrils daily as needed for allergies or rhinitis. 11.1 mL 6  . levothyroxine (SYNTHROID) 88 MCG tablet Take 1 tablet (88 mcg total) by mouth daily before breakfast. Empty stomach- 1 hour prior to breakfast. 90 tablet 3  . loratadine (CLARITIN) 10 MG tablet Take 1 tablet (10 mg total) by mouth daily. 30 tablet 11  . Multiple  Vitamin (MULTIVITAMIN WITH MINERALS) TABS tablet Take 1 tablet by mouth daily.     Marland Kitchen oxybutynin (DITROPAN) 5 MG tablet TAKE 1 TABLET BY MOUTH 3 TIMES A DAY AS NEEDED 90 tablet 0  . prednisoLONE acetate (PRED FORTE) 1 % ophthalmic suspension Place 1 drop into both eyes daily.    . predniSONE (DELTASONE) 20 MG tablet Take 2 pills in AM x 1 week; and then 1 pill a day. Take with food. 40 tablet 0  . tamsulosin (FLOMAX) 0.4 MG CAPS capsule Take 1 capsule (0.4 mg total) by mouth at bedtime. 90 capsule 1   No current facility-administered medications for this visit.   Facility-Administered Medications Ordered in Other Visits  Medication Dose Route Frequency Provider Last Rate Last Admin  . 0.9 %  sodium chloride infusion   Intravenous Continuous Charlaine Dalton R, MD      . dexamethasone (DECADRON) injection 10 mg  10 mg Intravenous Once Charlaine Dalton R, MD          .  PHYSICAL EXAMINATION: ECOG PERFORMANCE STATUS: 1 - Symptomatic but completely ambulatory  Vitals:   07/09/20 0845  BP: 92/70  Pulse: 78  Resp: 16  Temp: (!) 97.1 F (36.2 C)  SpO2: 98%   Filed Weights   07/09/20 0845  Weight: 110 lb 9.6 oz (50.2 kg)    Physical Exam Constitutional:      Comments: Walking himself.  Thin built moderately nourished male patient.  Accompanied by son.  HENT:     Head: Normocephalic and atraumatic.     Mouth/Throat:     Pharynx: No oropharyngeal exudate.  Eyes:     Pupils: Pupils are equal, round, and reactive to light.     Comments: Chronic drooping of the left eyelid.  Cardiovascular:     Rate and Rhythm: Normal rate and regular rhythm.  Pulmonary:     Effort: No respiratory distress.     Breath sounds: No wheezing.  Abdominal:     General: Bowel sounds are normal. There is no distension.     Palpations: Abdomen is soft. There is no mass.     Tenderness: There is no abdominal tenderness. There is no guarding or rebound.  Musculoskeletal:        General: No  tenderness. Normal range of motion.     Cervical back: Normal range of motion and neck supple.  Skin:    General: Skin is warm.  Neurological:     Mental Status: He is alert and oriented to person, place, and time.  Psychiatric:        Mood and Affect: Affect normal.      LABORATORY DATA:  I have reviewed the data as listed Lab Results  Component Value Date   WBC 7.3 07/09/2020   HGB 9.1 (L) 07/09/2020   HCT 28.5 (L) 07/09/2020   MCV 101.8 (H) 07/09/2020  PLT 541 (H) 07/09/2020   Recent Labs    02/05/20 0835 02/19/20 0813 03/04/20 0801 03/18/20 0819 07/01/20 0821 07/01/20 1208 07/09/20 0831  NA 135 135 137   < > 136 137 137  K 4.3 4.7 4.7   < > 4.6 5.0 5.2*  CL 107 106 107   < > 107 107 106  CO2 21* 21* 23   < > 20* 21* 21*  GLUCOSE 139* 101* 98   < > 103* 96 84  BUN 49* 34* 33*   < > 51* 52* 40*  CREATININE 2.30* 2.24* 2.29*   < > 2.45* 2.37* 1.88*  CALCIUM 9.0 8.9 8.9   < > 9.0 9.1 9.0  GFRNONAA 25* 26* 25*   < > 25* 26* 35*  GFRAA 29* 30* 29*  --   --   --   --   PROT 7.1 7.0 7.0   < > 7.0 7.2 6.5  ALBUMIN 4.0 3.9 4.0   < > 2.9* 2.9* 2.7*  AST _0 < > 50* 44* 48*  ALT _1 < > 41 40 48*  ALKPHOS 70 70 65   < > 108 108 148*  BILITOT 0.6 0.8 0.6   < > 0.8 0.9 0.6   < > = values in this interval not displayed.    RADIOGRAPHIC STUDIES: I have personally reviewed the radiological images as listed and agreed with the findings in the report. DG Chest 2 View  Result Date: 07/01/2020 CLINICAL DATA:  Shortness of breath. EXAM: CHEST - 2 VIEW COMPARISON:  CT 04/12/2020. FINDINGS: Mediastinum and hilar structures normal. Heart size normal. New right upper lung prominent pulmonary infiltrate. Patchy right base and left mid lung infiltrates again noted. No pleural effusion or pneumothorax. Heart size normal. Diffuse osteopenia. Degenerative change thoracic spine. IMPRESSION: New right upper lung prominent pulmonary infiltrate. Patchy right base and left mid  lung infiltrates again noted. Electronically Signed   By: Marcello Moores  Register   On: 07/01/2020 09:48    ASSESSMENT & PLAN:   Cancer of overlapping sites of bladder ([place]) # High-grade transitional cell carcinoma of the bladder metastatic to retroperitoneal lymph node.  Stage IV; NOV 1s, 2021-  CT-chest and pelvis-noncontrast- STABLE. No findings of recurrent malignancy; Bilateral Ground glass opacities noted [see below]  # HOLD opdivo today. Labs today reviewed-see below.  Discussed with patient and son that we will likely have to hold further immunotherapy for now.  # Unsteady gait/ no falls- ?  Borderline low blood pressures-symptomatic.  Question related to Opdivo [?  Adrenal insufficiency; recommend a.m. cortisol].  Recommend IV fluids-1 L; also recommend dexamethasone 8 mg times IV 1  # Incidentral [NOv 1st, 2021-CT new ground-glass opacities in both lungs primarily-asymptomatic.  Chest x-ray July 01, 2020 stable.  Home Covid test negative.  Will monitor for now.  # Iatrogenic hypothyroidism-on Synthroid 88 mcg; TSH JAN 2022- WNL.   # Anemia sec to CKD/ on Iv iron.Hb-91.- iron sats- 11% [NOV 2021];STABLE; s/p cysto [02/11/20]; proceed with venofer today  # weight loss-unclear etiology; thyroid levels normal; see above.  # CKD stage IV-GFR 26- STABLE.   I spoke at length with the patient's family, Ronalee Belts- regarding the patient's clinical status/plan of care.  Family agreement.    # DISPOSITION:  # # Cortisol level AM today;  # HOLD OPDIVO; HOLD IV venofer. IVFs over 1 hour; dex 10 mg x1.  # MRI Brain STAT.  # Follow-up  in 2 weeks- MD labs-cbc/cmp- Dr.B   All questions were answered. The patient knows to call the clinic with any problems, questions or concerns.    Cammie Sickle, MD 07/09/2020 9:49 AM

## 2020-07-09 NOTE — Assessment & Plan Note (Addendum)
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  Stage IV; NOV 1s, 2021-  CT-chest and pelvis-noncontrast- STABLE. No findings of recurrent malignancy; Bilateral Ground glass opacities noted [see below]  # HOLD opdivo today. Labs today reviewed-see below.  Discussed with patient and son that we will likely have to hold further immunotherapy for now.  # Unsteady gait/ no falls- ?  Borderline low blood pressures-symptomatic.  Question related to Opdivo [?  Adrenal insufficiency; recommend a.m. cortisol].  Recommend IV fluids-1 L; also recommend dexamethasone 8 mg times IV 1  # Incidentral [NOv 1st, 2021-CT new ground-glass opacities in both lungs primarily-asymptomatic.  Chest x-ray July 01, 2020 stable.  Home Covid test negative.  Will monitor for now.  # Iatrogenic hypothyroidism-on Synthroid 88 mcg; TSH JAN 2022- WNL.   # Anemia sec to CKD/ on Iv iron.Hb-91.- iron sats- 11% [NOV 2021];STABLE; s/p cysto [02/11/20]; proceed with venofer today  # weight loss-unclear etiology; thyroid levels normal; see above.  # CKD stage IV-GFR 26- STABLE.   I spoke at length with the patient's family, Ronalee Belts- regarding the patient's clinical status/plan of care.  Family agreement.    # DISPOSITION:  # # Cortisol level AM today;  # HOLD OPDIVO; HOLD IV venofer. IVFs over 1 hour; dex 10 mg x1.  # MRI Brain STAT.  # Follow-up in 2 weeks- MD labs-cbc/cmp- Dr.B

## 2020-07-09 NOTE — Progress Notes (Signed)
Pt tolerated infusion well with no signs of complications. VSS. Pt stable for discharge. Pt escorted out by RN via wheelchair to be driven home by son.   Rennae Ferraiolo [employer]

## 2020-07-12 ENCOUNTER — Telehealth: Payer: Self-pay | Admitting: Internal Medicine

## 2020-07-12 DIAGNOSIS — J392 Other diseases of pharynx: Secondary | ICD-10-CM

## 2020-07-12 NOTE — Telephone Encounter (Signed)
Referral faxed to ENT- atten. Dr. Richardson Landry

## 2020-07-12 NOTE — Telephone Encounter (Signed)
On 1/29-I called the patient's son Ronalee Belts regarding results of the MRI-negative for any stroke; 3 to 4 mm right frontal lesion-suspect blood vessel rather than metastatic disease.  Cystic lesion noted in the throat-we will make ENT referral.   I suspect patient feeling poorly from ongoing adverse reaction from nivolumab.  Hold nivolumab for now.  Follow-up as planned.  C-referral to ENT; re: nasopharyngeal cystic lesion.  Thanks GB

## 2020-07-12 NOTE — Addendum Note (Signed)
Addended by: Gloris Ham on: 07/12/2020 08:53 AM   Modules accepted: Orders

## 2020-07-15 ENCOUNTER — Telehealth: Payer: Self-pay | Admitting: Internal Medicine

## 2020-07-15 NOTE — Telephone Encounter (Signed)
Pt son called to follow-up on ENT referral. He would like call to discuss. Thank you

## 2020-07-15 NOTE — Telephone Encounter (Signed)
Spoke with Ronalee Belts. He has not heard when the ENT apt was scheduled. I contacted the ENT office. They have left msg for the patient to return the phone call and patient has not called the office back. I gave the ENT office Mike's phone number. Scheduler will reach out to [facility] to provide an apt with Dr. Richardson Landry

## 2020-07-21 DIAGNOSIS — J339 Nasal polyp, unspecified: Secondary | ICD-10-CM | POA: Diagnosis not present

## 2020-07-23 ENCOUNTER — Other Ambulatory Visit: Payer: Self-pay

## 2020-07-23 ENCOUNTER — Encounter: Payer: Self-pay | Admitting: Internal Medicine

## 2020-07-23 ENCOUNTER — Inpatient Hospital Stay: Payer: Medicare HMO | Admitting: Internal Medicine

## 2020-07-23 ENCOUNTER — Inpatient Hospital Stay: Payer: Medicare HMO | Attending: Internal Medicine

## 2020-07-23 VITALS — BP 151/68 | HR 57 | Temp 97.1°F | Resp 18 | Wt 120.2 lb

## 2020-07-23 DIAGNOSIS — C678 Malignant neoplasm of overlapping sites of bladder: Secondary | ICD-10-CM

## 2020-07-23 DIAGNOSIS — Z87891 Personal history of nicotine dependence: Secondary | ICD-10-CM | POA: Diagnosis not present

## 2020-07-23 DIAGNOSIS — Z7982 Long term (current) use of aspirin: Secondary | ICD-10-CM | POA: Insufficient documentation

## 2020-07-23 DIAGNOSIS — N183 Chronic kidney disease, stage 3 unspecified: Secondary | ICD-10-CM | POA: Diagnosis not present

## 2020-07-23 DIAGNOSIS — C772 Secondary and unspecified malignant neoplasm of intra-abdominal lymph nodes: Secondary | ICD-10-CM | POA: Insufficient documentation

## 2020-07-23 DIAGNOSIS — D631 Anemia in chronic kidney disease: Secondary | ICD-10-CM | POA: Insufficient documentation

## 2020-07-23 DIAGNOSIS — Z7952 Long term (current) use of systemic steroids: Secondary | ICD-10-CM | POA: Diagnosis not present

## 2020-07-23 DIAGNOSIS — M8580 Other specified disorders of bone density and structure, unspecified site: Secondary | ICD-10-CM | POA: Diagnosis not present

## 2020-07-23 DIAGNOSIS — N1831 Chronic kidney disease, stage 3a: Secondary | ICD-10-CM | POA: Diagnosis not present

## 2020-07-23 LAB — CBC WITH DIFFERENTIAL/PLATELET
Abs Immature Granulocytes: 0.1 10*3/uL — ABNORMAL HIGH (ref 0.00–0.07)
Basophils Absolute: 0 10*3/uL (ref 0.0–0.1)
Basophils Relative: 0 %
Eosinophils Absolute: 0.3 10*3/uL (ref 0.0–0.5)
Eosinophils Relative: 2 %
HCT: 28.1 % — ABNORMAL LOW (ref 39.0–52.0)
Hemoglobin: 9.1 g/dL — ABNORMAL LOW (ref 13.0–17.0)
Immature Granulocytes: 1 %
Lymphocytes Relative: 20 %
Lymphs Abs: 2.5 10*3/uL (ref 0.7–4.0)
MCH: 32.9 pg (ref 26.0–34.0)
MCHC: 32.4 g/dL (ref 30.0–36.0)
MCV: 101.4 fL — ABNORMAL HIGH (ref 80.0–100.0)
Monocytes Absolute: 1 10*3/uL (ref 0.1–1.0)
Monocytes Relative: 8 %
Neutro Abs: 8.5 10*3/uL — ABNORMAL HIGH (ref 1.7–7.7)
Neutrophils Relative %: 69 %
Platelets: 317 10*3/uL (ref 150–400)
RBC: 2.77 MIL/uL — ABNORMAL LOW (ref 4.22–5.81)
RDW: 15.1 % (ref 11.5–15.5)
WBC: 12.3 10*3/uL — ABNORMAL HIGH (ref 4.0–10.5)
nRBC: 0 % (ref 0.0–0.2)

## 2020-07-23 LAB — COMPREHENSIVE METABOLIC PANEL
ALT: 36 U/L (ref 0–44)
AST: 48 U/L — ABNORMAL HIGH (ref 15–41)
Albumin: 3 g/dL — ABNORMAL LOW (ref 3.5–5.0)
Alkaline Phosphatase: 69 U/L (ref 38–126)
Anion gap: 8 (ref 5–15)
BUN: 31 mg/dL — ABNORMAL HIGH (ref 8–23)
CO2: 25 mmol/L (ref 22–32)
Calcium: 9 mg/dL (ref 8.9–10.3)
Chloride: 106 mmol/L (ref 98–111)
Creatinine, Ser: 1.59 mg/dL — ABNORMAL HIGH (ref 0.61–1.24)
GFR, Estimated: 42 mL/min — ABNORMAL LOW (ref >60)
Glucose, Bld: 72 mg/dL (ref 70–99)
Potassium: 4.2 mmol/L (ref 3.5–5.1)
Sodium: 139 mmol/L (ref 135–145)
Total Bilirubin: 0.7 mg/dL (ref 0.3–1.2)
Total Protein: 5.7 g/dL — ABNORMAL LOW (ref 6.5–8.1)

## 2020-07-23 LAB — FERRITIN: Ferritin: 1212 ng/mL — ABNORMAL HIGH (ref 24–336)

## 2020-07-23 LAB — IRON AND TIBC
Iron: 110 ug/dL (ref 45–182)
Saturation Ratios: 51 % — ABNORMAL HIGH (ref 17.9–39.5)
TIBC: 216 ug/dL — ABNORMAL LOW (ref 250–450)
UIBC: 106 ug/dL

## 2020-07-23 MED ORDER — PREDNISONE 20 MG PO TABS: 10.0000 mg | ORAL_TABLET | Freq: Every day | ORAL | 0 refills | Status: DC

## 2020-07-23 NOTE — Progress Notes (Signed)
[facility] NOTE  Patient Care Team: [name], [name], [name], MD as Consulting Physician (Gastroenterology) Clyde Canterbury, MD as Referring Physician (Otolaryngology)  CHIEF COMPLAINTS/PURPOSE OF CONSULTATION: Bladder cancer   Oncology History Overview Note  # AUG 2019-TRANSITIONAL CELL BLADDER CA [~ 4cm tumor] s/p cystoscopy [Dr.Stoiff]  with extensive angiolymphatic invasion; [name]; [name]; mediastinal uptake; right pubic rami uptake.  # [name]; Jan 18th 2021- switched to [facility]- [pt preference; [name]; July 2020 cystoscopy-no evidence of bladder malignancy/Dr. Bernardo Heater- enlarged prostate [PSA- 0.95; 2021]  # Molecular testing- PDL-1 CPS- 20%; [name]  ;GOALS: palliative  CURRENT/MOST RECENT THERAPY:OPDIVO [C]     Cancer of overlapping sites of bladder ([place])  02/28/2018 - 06/09/2019 Chemotherapy   The patient had atezolizumab (TECENTRIQ) 1,[name], 1,200 mg, Intravenous, Once, [name],200 mg (02/28/2018), 1,200 mg (03/21/2018), 1,200 mg (04/11/2018), 1,200 mg (05/02/2018), 1,200 mg (06/13/2018), 1,200 mg (05/23/2018), 1,200 mg (07/04/2018), 1,200 mg (07/25/2018), 1,200 mg (08/15/2018), 1,200 mg (09/05/2018), 1,200 mg (10/25/2018), 1,200 mg (11/22/2018), 1,200 mg (12/20/2018), 1,200 mg (01/10/2019), 1,200 mg (01/31/2019), 1,200 mg (02/21/2019), 1,200 mg (03/14/2019), 1,200 mg (04/04/2019), 1,200 mg (04/25/2019), 1,200 mg (05/16/2019), 1,200 mg (06/09/2019)  for chemotherapy treatment.     06/30/2019 -  Chemotherapy   The patient had nivolumab (OPDIVO) 240 mg in sodium chloride 0.9 % 100 mL chemo infusion, 240 mg, Intravenous, Once, 22 of 23 cycles Administration: 240 mg (06/30/2019), 240 mg (07/14/2019), 240 mg (07/28/2019), 240 mg (08/11/2019), 240 mg (08/25/2019), 240 mg (09/08/2019), 240 mg (09/22/2019), 240 mg (11/03/2019), 240 mg (11/17/2019), 240 mg (12/10/2019), 240 mg (12/24/2019), 240 mg (01/08/2020), 240 mg (01/22/2020), 240 mg (02/05/2020), 240 mg (02/19/2020), 240 mg (03/04/2020), 240 mg (03/18/2020), 240 mg (04/01/2020), 240 mg (04/29/2020), 240 mg (05/13/2020), 240 mg (05/27/2020), 240 mg (06/17/2020)  for chemotherapy treatment.     HISTORY OF PRESENTING ILLNESS: Edward Cummings 87 y.o.  male with metastatic transitional carcinoma of the bladder currently on Opdivo is here for follow-up/review results of the brain MRI.  Patient was started on prednisone couple of weeks ago.  Patient has noted significant improvement in his energy levels.  His breathing is improved.  He is not as dizzy anymore.  Gained about 10 pounds.   Review of Systems  Constitutional: Positive for malaise/fatigue. Negative for chills, diaphoresis and fever.  HENT: Negative for nosebleeds and sore throat.   Eyes: Negative for double vision.  Respiratory: Negative for hemoptysis, sputum production and wheezing.   Cardiovascular: Negative for chest pain, palpitations, orthopnea and leg swelling.  Gastrointestinal: Negative for abdominal pain, blood in stool, diarrhea, heartburn, melena, nausea and vomiting.  Genitourinary: Negative for dysuria.  Musculoskeletal: Positive for back pain and joint pain.  Skin: Negative.  Negative for itching and rash.  Neurological: Negative for tingling, focal weakness and headaches.  Endo/Heme/Allergies: Does not bruise/bleed easily.  Psychiatric/Behavioral: Negative for depression. The patient is not nervous/anxious and does not have insomnia.      MEDICAL HISTORY:  Past Medical  History:  Diagnosis Date  . Anemia   .  Anxiety 12/31/2019  . Cancer ([place])    bladder  . Chronic kidney disease   . Depression   . Hypertension   . Neuromuscular disorder ([place])    Nerve damage to left face/eye since around 2000-08-15.    SURGICAL HISTORY: Past Surgical History:  Procedure Laterality Date  . CYSTOSCOPY W/ RETROGRADES Bilateral 01/25/2018   Procedure: CYSTOSCOPY WITH RETROGRADE PYELOGRAM;  [name], MD;  [name];  Service: Urology;  Laterality: Bilateral;  . CYSTOSCOPY W/ URETERAL STENT PLACEMENT Bilateral 01/06/2019   Procedure: CYSTOSCOPY WITH RETROGRADE PYELOGRAM/URETERAL STENT REMOVAL;  [name], MD;  [name];  Service: Urology;  Laterality: Bilateral;  . CYSTOSCOPY WITH STENT PLACEMENT Bilateral 01/25/2018   Procedure: CYSTOSCOPY WITH STENT PLACEMENT;  [name], MD;  [name];  Service: Urology;  Laterality: Bilateral;  . DORSAL SLIT N/A 01/06/2019   Procedure: DORSAL SLIT;  [name], MD;  [name];  Service: Urology;  [name];  . ESOPHAGOGASTRODUODENOSCOPY (EGD) WITH PROPOFOL N/A 11/12/2019   Procedure: ESOPHAGOGASTRODUODENOSCOPY (EGD) WITH PROPOFOL;  Surgeon: Virgel Manifold, MD;  Location: [facility] END[facility];  Service: Endoscopy;  [name];  . EYE SURGERY     Cornea transplants bilaterally & cataract surgery.  . TRANSURETHRAL RESECTION OF BLADDER TUMOR N/A 01/25/2018   Procedure: TRANSURETHRAL RESECTION OF BLADDER TUMOR (TURBT);  [name], MD;  [name];  Service: Urology;  [name];    SOCIAL HISTORY: Social History   Socioeconomic History  . Marital status: Married    Spouse name: [name]  . Number of children: Not on file  . Years of education: Not on file  . Highest education level: Not on file  Occupational History  . Not on file  Tobacco Use  . Smoking status: Former Smoker    Types: Cigarettes  .  Smokeless tobacco: Current User    Types: Chew  . Tobacco comment: Stopped approximately 10 years ago.  Vaping Use  . Vaping Use: Never used  Substance and Sexual Activity  . Alcohol use: Not Currently    Alcohol/week: 2.0 standard drinks    Types: 2 Cans of beer per week    Comment: Daily  . Drug use: Never  . Sexual activity: Yes    Birth control/protection: None  Other Topics Concern  . Not on file  Social History Narrative    lives in [place]; with son. Wife died in 08/15/18.  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     Social Determinants of Health   Financial Resource Strain: Not on file  Food Insecurity: Not on file  Transportation Needs: Not on file  Physical Activity: Not on file  Stress: Not on file  Social Connections: Not on file  Intimate Partner Violence: Not on file    FAMILY HISTORY: Family History  Problem Relation Age of Onset  . Prostate cancer Neg Hx   . Kidney cancer Neg Hx   . Bladder Cancer Neg Hx     ALLERGIES:  has No Known Allergies.  MEDICATIONS:  Current Outpatient Medications  Medication Sig Dispense Refill  . aspirin EC 81 MG tablet Take 81 mg by mouth daily.     Marland Kitchen docusate sodium (COLACE) 50 MG capsule Take 50 mg by mouth at bedtime.    . fexofenadine (ALLEGRA) 180 MG tablet Take 180 mg by mouth daily.     . fluticasone (FLONASE) 50 MCG/ACT nasal spray  Place 2 sprays into both nostrils daily as needed for allergies or rhinitis. 11.1 mL 6  . levothyroxine (SYNTHROID) 88 MCG tablet Take 1 tablet (88 mcg total) by mouth daily before breakfast. Empty stomach- 1 hour prior to breakfast. 90 tablet 3  . loratadine (CLARITIN) 10 MG tablet Take 1 tablet (10 mg total) by mouth daily. 30 tablet 11  . Multiple Vitamin (MULTIVITAMIN WITH MINERALS) TABS tablet Take 1 tablet by mouth daily.     Marland Kitchen oxybutynin (DITROPAN) 5 MG tablet TAKE 1 TABLET BY MOUTH 3 TIMES A DAY AS NEEDED 90 tablet 0  . prednisoLONE acetate (PRED FORTE)  1 % ophthalmic suspension Place 1 drop into both eyes daily.    . tamsulosin (FLOMAX) 0.4 MG CAPS capsule TAKE 1 CAPSULE BY MOUTH ONCE DAILY 90 capsule 1  . predniSONE (DELTASONE) 20 MG tablet Take 0.5 tablets (10 mg total) by mouth daily with breakfast. 20 tablet 0   No current facility-administered medications for this visit.      Marland Kitchen  PHYSICAL EXAMINATION: ECOG PERFORMANCE STATUS: 1 - Symptomatic but completely ambulatory  Vitals:   07/23/20 0910  BP: (!) 151/68  Pulse: (!) 57  Resp: 18  Temp: (!) 97.1 F (36.2 C)  SpO2: 100%   Filed Weights   07/23/20 0910  Weight: 120 lb 3.2 oz (54.5 kg)    Physical Exam Constitutional:      Comments: Walking himself.  Thin built moderately nourished male patient.  Accompanied by son.  HENT:     Head: Normocephalic and atraumatic.     Mouth/Throat:     Pharynx: No oropharyngeal exudate.  Eyes:     Pupils: Pupils are equal, round, and reactive to light.     Comments: Chronic drooping of the left eyelid.  Cardiovascular:     Rate and Rhythm: Normal rate and regular rhythm.  Pulmonary:     Effort: No respiratory distress.     Breath sounds: No wheezing.  Abdominal:     General: Bowel sounds are normal. There is no distension.     Palpations: Abdomen is soft. There is no mass.     Tenderness: There is no abdominal tenderness. There is no guarding or rebound.  Musculoskeletal:        General: No tenderness. Normal range of motion.     Cervical back: Normal range of motion and neck supple.  Skin:    General: Skin is warm.  Neurological:     Mental Status: He is alert and oriented to person, place, and time.  Psychiatric:        Mood and Affect: Affect normal.      LABORATORY DATA:  I have reviewed the data as listed Lab Results  Component Value Date   WBC 12.3 (H) 07/23/2020   HGB 9.1 (L) 07/23/2020   HCT 28.1 (L) 07/23/2020   MCV 101.4 (H) 07/23/2020   PLT 317 07/23/2020   Recent Labs    02/05/20 0835  02/19/20 0813 03/04/20 0801 03/18/20 0819 07/01/20 1208 07/09/20 0831 07/23/20 0842  NA 135 135 137   < > 137 137 139  K 4.3 4.7 4.7   < > 5.0 5.2* 4.2  CL 107 106 107   < > 107 106 106  CO2 21* 21* 23   < > 21* 21* 25  GLUCOSE 139* 101* 98   < > 96 84 72  BUN 49* 34* 33*   < > 52* 40* 31*  CREATININE 2.30* 2.24* 2.29*   < >  2.37* 1.88* 1.59*  CALCIUM 9.0 8.9 8.9   < > 9.1 9.0 9.0  GFRNONAA 25* 26* 25*   < > 26* 35* 42*  GFRAA 29* 30* 29*  --   --   --   --   PROT 7.1 7.0 7.0   < > 7.2 6.5 5.7*  ALBUMIN 4.0 3.9 4.0   < > 2.9* 2.7* 3.0*  AST '20 17 18   ' < > 44* 48* 48*  ALT '13 12 13   ' < > 40 48* 36  ALKPHOS 70 70 65   < > 108 148* 69  BILITOT 0.6 0.8 0.6   < > 0.9 0.6 0.7   < > = values in this interval not displayed.    RADIOGRAPHIC STUDIES: I have personally reviewed the radiological images as listed and agreed with the findings in the report. DG Chest 2 View  Result Date: 07/01/2020 CLINICAL DATA:  Shortness of breath. EXAM: CHEST - 2 VIEW COMPARISON:  CT 04/12/2020. FINDINGS: Mediastinum and hilar structures normal. Heart size normal. New right upper lung prominent pulmonary infiltrate. Patchy right base and left mid lung infiltrates again noted. No pleural effusion or pneumothorax. Heart size normal. Diffuse osteopenia. Degenerative change thoracic spine. IMPRESSION: New right upper lung prominent pulmonary infiltrate. Patchy right base and left mid lung infiltrates again noted. Electronically Signed   By: Marcello Moores  Register   On: 07/01/2020 09:48   MR Brain W Wo Contrast  Result Date: 07/09/2020 CLINICAL DATA:  Cancer of overlapping sites of bladder. Gait instability. Dizziness, persistent/recurrent cardiac or vascular cause suspected. Additional history provided by scanning technologist: Patient with history of metastatic transitional carcinoma of the bladder, gait unsteady, dizzy spells. EXAM: MRI HEAD WITHOUT AND WITH CONTRAST TECHNIQUE: Multiplanar, multiecho pulse sequences  of the brain and surrounding structures were obtained without and with intravenous contrast. CONTRAST:  52m GADAVIST GADOBUTROL 1 MMOL/ML IV SOLN COMPARISON:  Head CT 04/13/2018. Head CT 07/02/2008. PET-CT 02/26/2018. FINDINGS: Brain: Mild-to-moderate cerebral atrophy. Comparatively mild cerebellar atrophy. No significant white matter disease for age. A 2 mm focus of enhancement along the anterior right frontal lobe is favored vascular (series 18, image 78). Elsewhere, no abnormal intracranial enhancement is identified. There is no acute infarct. No chronic intracranial blood products. No extra-axial fluid collection. No midline shift. Vascular: Expected proximal arterial flow voids. Skull and upper cervical spine: No focal marrow lesion. Incompletely assessed cervical spondylosis. Sinuses/Orbits: Visualized orbits show no acute finding. Mild bilateral ethmoid and maxillary sinus mucosal thickening. Trace fluid within the bilateral mastoid air cells. Other: New from the prior head CT of 04/13/2018, there is a 2.0 x 1.2 x 1.8 cm peripherally enhancing cystic lesion within the midline posterior nasopharynx (series 10, image 4) (series 20, image 11). IMPRESSION: A 2 mm enhancing focus along the anterior right frontal lobe is likely vascular enhancement. However, 4-6 month contrast-enhanced MRI follow-up is recommended to ensure stability and definitively exclude a tiny metastasis. Otherwise, there is no evidence of intracranial metastatic disease. No evidence of acute or recent subacute infarction. Mild-to-moderate cerebral atrophy with comparatively mild cerebellar atrophy. 2 cm cystic lesion within the midline posterior nasopharynx, new as compared to the head CT of 04/13/2018. This may reflect a mucous retention cyst, but remains indeterminate. Attention recommended on follow-up. Additionally, consider ENT consultation. Electronically Signed   By: KKellie SimmeringDO   On: 07/09/2020 14:57    ASSESSMENT & PLAN:    [place] High-grade transitional  cell carcinoma of the bladder metastatic to retroperitoneal lymph node.  Stage IV; NOV 1s, 2021- CT-chest and pelvis-noncontrast- STABLE. No findings of recurrent malignancy; Bilateral Ground glass opacities noted [see below]  # continue to HOLD opdivo today- given poor tolerance. Labs today reviewed-see below.  Discussed with patient and son that we will likely have to hold further immunotherapy for now. Will order scan at next visit.   # Unsteady gait/ no falls ? Secondary to opdivo [no evidence of adrenal insuffiency]- but improved on prednisone 20 mg/day; continue/taper 10 mg/day.   # Incidentral [NOv 1st, 2021-CT new ground-glass opacities in both lungs primarily-asymptomatic.  Chest x-ray July 01, 2020 stable. Will order scan at next visit.   # Iatrogenic hypothyroidism-on Synthroid 88 mcg; TSH JAN 2022- WNL.   # Anemia sec to CKD/ on Iv iron.Hb-9.1 STABLE;  iron sats- 11% [NOV 2021];STABLE; s/p cysto [02/11/20]; HOLD venofer today. Check iron studies/ferritin.   # incidental 2 mm enhancing focus along the anterior right frontal lobe- clinically likely vascular enhancement; less likely metastasis. Will repeat MRI in 4 months.   # incidental MRI- 2 cm cystic lesion within the midline posterior nasopharynx, new- s/p evaluation Dr.McQueen.   # CKD stageIII- IV-GFR 42- STABLE.    I spoke at length with the patient's family, Ronalee Belts- regarding the patient's clinical status/plan of care.  Family agreement.    # DISPOSITION:   # Follow-up in 3 weeks- MD labs-cbc/cmp/possible venofer- Dr.B  # I reviewed the blood work- with the patient in detail; also reviewed the imaging independently [as summarized above]; and with the patient in detail.     All questions were answered. The patient knows to call the clinic with any problems, questions or concerns.    Cammie Sickle, MD 07/23/2020 10:09 AM

## 2020-07-23 NOTE — Assessment & Plan Note (Addendum)
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  Stage IV; NOV 1s, 2021- CT-chest and pelvis-noncontrast- STABLE. No findings of recurrent malignancy; Bilateral Ground glass opacities noted [see below]  # continue to HOLD opdivo today- given poor tolerance. Labs today reviewed-see below.  Discussed with patient and son that we will likely have to hold further immunotherapy for now. Will order scan at next visit.   # Unsteady gait/ no falls ? Secondary to opdivo [no evidence of adrenal insuffiency]- but improved on prednisone 20 mg/day; continue/taper 10 mg/day.   # Incidentral [NOv 1st, 2021-CT new ground-glass opacities in both lungs primarily-asymptomatic.  Chest x-ray July 01, 2020 stable. Will order scan at next visit.   # Iatrogenic hypothyroidism-on Synthroid 88 mcg; TSH JAN 2022- WNL.   # Anemia sec to CKD/ on Iv iron.Hb-9.1 STABLE;  iron sats- 11% [NOV 2021];STABLE; s/p cysto [02/11/20]; HOLD venofer today. Check iron studies/ferritin.   # incidental 2 mm enhancing focus along the anterior right frontal lobe- clinically likely vascular enhancement; less likely metastasis. Will repeat MRI in 4 months.   # incidental MRI- 2 cm cystic lesion within the midline posterior nasopharynx, new- s/p evaluation Dr.McQueen.   # CKD stageIII- IV-GFR 42- STABLE.    I spoke at length with the patient's family, Ronalee Belts- regarding the patient's clinical status/plan of care.  Family agreement.    # DISPOSITION:   # Follow-up in 3 weeks- MD labs-cbc/cmp/possible venofer- Dr.B  # I reviewed the blood work- with the patient in detail; also reviewed the imaging independently [as summarized above]; and with the patient in detail.

## 2020-07-23 NOTE — Progress Notes (Signed)
States that prednisone has help a lot with his appetite and energy. Has been feeling a lot better. Wants to see if he can stay on it daily or at least every other day.

## 2020-08-02 ENCOUNTER — Other Ambulatory Visit: Payer: Self-pay | Admitting: Internal Medicine

## 2020-08-11 ENCOUNTER — Other Ambulatory Visit: Payer: Self-pay | Admitting: Urology

## 2020-08-13 ENCOUNTER — Inpatient Hospital Stay (HOSPITAL_BASED_OUTPATIENT_CLINIC_OR_DEPARTMENT_OTHER): Payer: Medicare HMO | Admitting: Internal Medicine

## 2020-08-13 ENCOUNTER — Inpatient Hospital Stay: Payer: Medicare HMO

## 2020-08-13 ENCOUNTER — Other Ambulatory Visit: Payer: Self-pay

## 2020-08-13 ENCOUNTER — Encounter: Payer: Self-pay | Admitting: Internal Medicine

## 2020-08-13 ENCOUNTER — Inpatient Hospital Stay: Payer: Medicare HMO | Attending: Internal Medicine

## 2020-08-13 VITALS — BP 140/43 | HR 57 | Temp 97.9°F | Resp 18 | Ht 69.0 in | Wt 118.1 lb

## 2020-08-13 DIAGNOSIS — Z7952 Long term (current) use of systemic steroids: Secondary | ICD-10-CM | POA: Insufficient documentation

## 2020-08-13 DIAGNOSIS — E875 Hyperkalemia: Secondary | ICD-10-CM | POA: Insufficient documentation

## 2020-08-13 DIAGNOSIS — Z7982 Long term (current) use of aspirin: Secondary | ICD-10-CM | POA: Insufficient documentation

## 2020-08-13 DIAGNOSIS — Z79899 Other long term (current) drug therapy: Secondary | ICD-10-CM | POA: Insufficient documentation

## 2020-08-13 DIAGNOSIS — C678 Malignant neoplasm of overlapping sites of bladder: Secondary | ICD-10-CM

## 2020-08-13 DIAGNOSIS — Z87891 Personal history of nicotine dependence: Secondary | ICD-10-CM | POA: Diagnosis not present

## 2020-08-13 DIAGNOSIS — D631 Anemia in chronic kidney disease: Secondary | ICD-10-CM | POA: Diagnosis not present

## 2020-08-13 DIAGNOSIS — E039 Hypothyroidism, unspecified: Secondary | ICD-10-CM | POA: Diagnosis not present

## 2020-08-13 DIAGNOSIS — N183 Chronic kidney disease, stage 3 unspecified: Secondary | ICD-10-CM | POA: Diagnosis not present

## 2020-08-13 DIAGNOSIS — R2681 Unsteadiness on feet: Secondary | ICD-10-CM | POA: Diagnosis not present

## 2020-08-13 DIAGNOSIS — C772 Secondary and unspecified malignant neoplasm of intra-abdominal lymph nodes: Secondary | ICD-10-CM | POA: Diagnosis not present

## 2020-08-13 LAB — CBC WITH DIFFERENTIAL/PLATELET
Abs Immature Granulocytes: 0.05 10*3/uL (ref 0.00–0.07)
Basophils Absolute: 0 10*3/uL (ref 0.0–0.1)
Basophils Relative: 0 %
Eosinophils Absolute: 0.1 10*3/uL (ref 0.0–0.5)
Eosinophils Relative: 1 %
HCT: 30.5 % — ABNORMAL LOW (ref 39.0–52.0)
Hemoglobin: 10 g/dL — ABNORMAL LOW (ref 13.0–17.0)
Immature Granulocytes: 1 %
Lymphocytes Relative: 7 %
Lymphs Abs: 0.7 10*3/uL (ref 0.7–4.0)
MCH: 33.3 pg (ref 26.0–34.0)
MCHC: 32.8 g/dL (ref 30.0–36.0)
MCV: 101.7 fL — ABNORMAL HIGH (ref 80.0–100.0)
Monocytes Absolute: 0.2 10*3/uL (ref 0.1–1.0)
Monocytes Relative: 2 %
Neutro Abs: 9.4 10*3/uL — ABNORMAL HIGH (ref 1.7–7.7)
Neutrophils Relative %: 89 %
Platelets: 214 10*3/uL (ref 150–400)
RBC: 3 MIL/uL — ABNORMAL LOW (ref 4.22–5.81)
RDW: 15 % (ref 11.5–15.5)
WBC: 10.5 10*3/uL (ref 4.0–10.5)
nRBC: 0 % (ref 0.0–0.2)

## 2020-08-13 LAB — COMPREHENSIVE METABOLIC PANEL
ALT: 24 U/L (ref 0–44)
AST: 24 U/L (ref 15–41)
Albumin: 3.7 g/dL (ref 3.5–5.0)
Alkaline Phosphatase: 55 U/L (ref 38–126)
Anion gap: 9 (ref 5–15)
BUN: 40 mg/dL — ABNORMAL HIGH (ref 8–23)
CO2: 24 mmol/L (ref 22–32)
Calcium: 9.3 mg/dL (ref 8.9–10.3)
Chloride: 105 mmol/L (ref 98–111)
Creatinine, Ser: 2.08 mg/dL — ABNORMAL HIGH (ref 0.61–1.24)
GFR, Estimated: 31 mL/min — ABNORMAL LOW (ref >60)
Glucose, Bld: 117 mg/dL — ABNORMAL HIGH (ref 70–99)
Potassium: 5.3 mmol/L — ABNORMAL HIGH (ref 3.5–5.1)
Sodium: 138 mmol/L (ref 135–145)
Total Bilirubin: 0.7 mg/dL (ref 0.3–1.2)
Total Protein: 6.4 g/dL — ABNORMAL LOW (ref 6.5–8.1)

## 2020-08-13 NOTE — Progress Notes (Signed)
[place] NOTE  Patient Care Team: [name], [name], [name], MD as Consulting Physician (Gastroenterology) Clyde Canterbury, MD as Referring Physician (Otolaryngology)  CHIEF COMPLAINTS/PURPOSE OF CONSULTATION: Bladder cancer   Oncology History Overview Note  # AUG 2019-TRANSITIONAL CELL BLADDER CA [~ 4cm tumor] s/p cystoscopy [Dr.Stoiff]  with extensive angiolymphatic invasion; [name]; [name]; mediastinal uptake; right pubic rami uptake.  # [name]; Jan 18th 2021- switched to [place]- [pt preference; [name]; July 2020 cystoscopy-no evidence of bladder malignancy/Dr. Bernardo Heater- enlarged prostate [PSA- 0.95; 2021]  # Molecular testing- PDL-1 CPS- 20%; [name]  ;GOALS: palliative  CURRENT/MOST RECENT THERAPY:OPDIVO [C]     Cancer of overlapping sites of bladder ([place])  02/28/2018 - 06/09/2019 Chemotherapy   The patient had atezolizumab (TECENTRIQ) 1,[name], 1,200 mg, Intravenous, Once, [name],200 mg (02/28/2018), 1,200 mg (03/21/2018), 1,200 mg (04/11/2018), 1,200 mg (05/02/2018), 1,200 mg (06/13/2018), 1,200 mg (05/23/2018), 1,200 mg (07/04/2018), 1,200 mg (07/25/2018), 1,200 mg (08/15/2018), 1,200 mg (09/05/2018), 1,200 mg (10/25/2018), 1,200 mg (11/22/2018), 1,200 mg (12/20/2018), 1,200 mg (01/10/2019), 1,200 mg (01/31/2019), 1,200 mg (02/21/2019), 1,200 mg (03/14/2019), 1,200 mg (04/04/2019), 1,200 mg (04/25/2019), 1,200 mg (05/16/2019), 1,200 mg (06/09/2019)  for chemotherapy treatment.     06/30/2019 -  Chemotherapy   The patient had nivolumab (OPDIVO) 240 mg in sodium chloride 0.9 % 100 mL chemo infusion, 240 mg, Intravenous, Once, 22 of 23 cycles Administration: 240 mg (06/30/2019), 240 mg (07/14/2019), 240 mg (07/28/2019), 240 mg (08/11/2019), 240 mg (08/25/2019), 240 mg (09/08/2019), 240 mg (09/22/2019), 240 mg (11/03/2019), 240 mg (11/17/2019), 240 mg (12/10/2019), 240 mg (12/24/2019), 240 mg (01/08/2020), 240 mg (01/22/2020), 240 mg (02/05/2020), 240 mg (02/19/2020), 240 mg (03/04/2020), 240 mg (03/18/2020), 240 mg (04/01/2020), 240 mg (04/29/2020), 240 mg (05/13/2020), 240 mg (05/27/2020), 240 mg (06/17/2020)  for chemotherapy treatment.     HISTORY OF PRESENTING ILLNESS: Edward Cummings 87 y.o.  male with metastatic transitional carcinoma of the bladder currently on Opdivo is here for follow-up.  As per the son patient has been doing better.  Denies any new shortness of breath or cough.  No fevers or chills.  Appetite is fair.  No dizzy spells no falls.   Review of Systems  Constitutional: Positive for malaise/fatigue. Negative for chills, diaphoresis and fever.  HENT: Negative for nosebleeds and sore throat.   Eyes: Negative for double vision.  Respiratory: Negative for hemoptysis, sputum production and wheezing.   Cardiovascular: Negative for chest pain, palpitations, orthopnea and leg swelling.  Gastrointestinal: Negative for abdominal pain, blood in stool, diarrhea, heartburn, melena, nausea and vomiting.  Genitourinary: Negative for dysuria.  Musculoskeletal: Positive for back pain and joint pain.  Skin: Negative.  Negative for itching and rash.  Neurological: Negative for tingling, focal weakness and headaches.  Endo/Heme/Allergies: Does not bruise/bleed easily.  Psychiatric/Behavioral: Negative for depression. The patient is not nervous/anxious and does not have insomnia.      MEDICAL HISTORY:  Past Medical History:  Diagnosis Date  . Anemia   . Anxiety 12/31/2019  . Cancer  (  [place])    bladder  . Chronic kidney disease   . Depression   . Hypertension   . Neuromuscular disorder ([place])    Nerve damage to left face/eye since around 2000/07/30.    SURGICAL HISTORY: Past Surgical History:  Procedure Laterality Date  . CYSTOSCOPY W/ RETROGRADES Bilateral 01/25/2018   Procedure: CYSTOSCOPY WITH RETROGRADE PYELOGRAM;  [name], MD;  [name];  Service: Urology;  Laterality: Bilateral;  . CYSTOSCOPY W/ URETERAL STENT PLACEMENT Bilateral 01/06/2019   Procedure: CYSTOSCOPY WITH RETROGRADE PYELOGRAM/URETERAL STENT REMOVAL;  [name], MD;  [name];  Service: Urology;  Laterality: Bilateral;  . CYSTOSCOPY WITH STENT PLACEMENT Bilateral 01/25/2018   Procedure: CYSTOSCOPY WITH STENT PLACEMENT;  [name], MD;  [name];  Service: Urology;  Laterality: Bilateral;  . DORSAL SLIT N/A 01/06/2019   Procedure: DORSAL SLIT;  [name], MD;  [name];  Service: Urology;  [name];  . ESOPHAGOGASTRODUODENOSCOPY (EGD) WITH PROPOFOL N/A 11/12/2019   Procedure: ESOPHAGOGASTRODUODENOSCOPY (EGD) WITH PROPOFOL;  Surgeon: Virgel Manifold, MD;  Location: [facility];  Service: Endoscopy;  [name];  . EYE SURGERY     Cornea transplants bilaterally & cataract surgery.  . TRANSURETHRAL RESECTION OF BLADDER TUMOR N/A 01/25/2018   Procedure: TRANSURETHRAL RESECTION OF BLADDER TUMOR (TURBT);  [name], MD;  [name];  Service: Urology;  [name];    SOCIAL HISTORY: Social History   Socioeconomic History  . Marital status: Married    Spouse name: [name]  . Number of children: Not on file  . Years of education: Not on file  . Highest education level: Not on file  Occupational History  . Not on file  Tobacco Use  . Smoking status: Former Smoker    Types: Cigarettes  . Smokeless tobacco: Current User    Types: Chew  . Tobacco comment:  Stopped approximately 10 years ago.  Vaping Use  . Vaping Use: Never used  Substance and Sexual Activity  . Alcohol use: Not Currently    Alcohol/week: 2.0 standard drinks    Types: 2 Cans of beer per week    Comment: Daily  . Drug use: Never  . Sexual activity: Yes    Birth control/protection: None  Other Topics Concern  . Not on file  Social History Narrative    lives in [place]; with son. Wife died in July 30, 2018.  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     Social Determinants of Health   Financial Resource Strain: Not on file  Food Insecurity: Not on file  Transportation Needs: Not on file  Physical Activity: Not on file  Stress: Not on file  Social Connections: Not on file  Intimate Partner Violence: Not on file    FAMILY HISTORY: Family History  Problem Relation Age of Onset  . Prostate cancer Neg Hx   . Kidney cancer Neg Hx   . Bladder Cancer Neg Hx     ALLERGIES:  has No Known Allergies.  MEDICATIONS:  Current Outpatient Medications  Medication Sig Dispense Refill  . aspirin EC 81 MG tablet Take 81 mg by mouth daily.     Marland Kitchen docusate sodium (COLACE) 50 MG capsule Take 50 mg by mouth at bedtime.    . fexofenadine (ALLEGRA) 180 MG tablet Take 180 mg by mouth daily.     . fluticasone (FLONASE) 50 MCG/ACT nasal spray Place 2 sprays into both  nostrils daily as needed for allergies or rhinitis. 11.1 mL 6  . levothyroxine (SYNTHROID) 88 MCG tablet Take 1 tablet (88 mcg total) by mouth daily before breakfast. Empty stomach- 1 hour prior to breakfast. 90 tablet 3  . loratadine (CLARITIN) 10 MG tablet Take 1 tablet (10 mg total) by mouth daily. 30 tablet 11  . Multiple Vitamin (MULTIVITAMIN WITH MINERALS) TABS tablet Take 1 tablet by mouth daily.     Marland Kitchen oxybutynin (DITROPAN) 5 MG tablet TAKE 1 TABLET BY MOUTH 3 TIMES A DAY AS NEEDED 90 tablet 0  . prednisoLONE acetate (PRED FORTE) 1 % ophthalmic suspension Place 1 drop into both eyes daily.    .  predniSONE (DELTASONE) 20 MG tablet Take 0.5 tablets (10 mg total) by mouth daily with breakfast. 20 tablet 0  . tamsulosin (FLOMAX) 0.4 MG CAPS capsule TAKE 1 CAPSULE BY MOUTH ONCE DAILY 90 capsule 1   No current facility-administered medications for this visit.      Marland Kitchen  PHYSICAL EXAMINATION: ECOG PERFORMANCE STATUS: 1 - Symptomatic but completely ambulatory  Vitals:   08/13/20 1326  BP: (!) 140/43  Pulse: (!) 57  Resp: 18  Temp: 97.9 F (36.6 C)  SpO2: 100%   Filed Weights   08/13/20 1326  Weight: 118 lb 2 oz (53.6 kg)    Physical Exam Constitutional:      Comments: Walking himself.  Thin built moderately nourished male patient.  Accompanied by son.  HENT:     Head: Normocephalic and atraumatic.     Mouth/Throat:     Pharynx: No oropharyngeal exudate.  Eyes:     Pupils: Pupils are equal, round, and reactive to light.     Comments: Chronic drooping of the left eyelid.  Cardiovascular:     Rate and Rhythm: Normal rate and regular rhythm.  Pulmonary:     Effort: No respiratory distress.     Breath sounds: No wheezing.  Abdominal:     General: Bowel sounds are normal. There is no distension.     Palpations: Abdomen is soft. There is no mass.     Tenderness: There is no abdominal tenderness. There is no guarding or rebound.  Musculoskeletal:        General: No tenderness. Normal range of motion.     Cervical back: Normal range of motion and neck supple.  Skin:    General: Skin is warm.  Neurological:     Mental Status: He is alert and oriented to person, place, and time.  Psychiatric:        Mood and Affect: Affect normal.      LABORATORY DATA:  I have reviewed the data as listed Lab Results  Component Value Date   WBC 10.5 08/13/2020   HGB 10.0 (L) 08/13/2020   HCT 30.5 (L) 08/13/2020   MCV 101.7 (H) 08/13/2020   PLT 214 08/13/2020   Recent Labs    02/05/20 0835 02/19/20 0813 03/04/20 0801 03/18/20 0819 07/09/20 0831 07/23/20 0842 08/13/20 1302   NA 135 135 137   < > 137 139 138  K 4.3 4.7 4.7   < > 5.2* 4.2 5.3*  CL 107 106 107   < > 106 106 105  CO2 21* 21* 23   < > 21* 25 24  GLUCOSE 139* 101* 98   < > 84 72 117*  BUN 49* 34* 33*   < > 40* 31* 40*  CREATININE 2.30* 2.24* 2.29*   < > 1.88* 1.59* 2.08*  CALCIUM 9.0 8.9 8.9   < > 9.0 9.0 9.3  GFRNONAA 25* 26* 25*   < > 35* 42* 31*  GFRAA 29* 30* 29*  --   --   --   --   PROT 7.1 7.0 7.0   < > 6.5 5.7* 6.4*  ALBUMIN 4.0 3.9 4.0   < > 2.7* 3.0* 3.7  AST _0 < > 48* 48* 24  ALT _1 < > 48* 36 24  ALKPHOS 70 70 65   < > 148* 69 55  BILITOT 0.6 0.8 0.6   < > 0.6 0.7 0.7   < > = values in this interval not displayed.    RADIOGRAPHIC STUDIES: I have personally reviewed the radiological images as listed and agreed with the findings in the report. No results found.  ASSESSMENT & PLAN:   Cancer of overlapping sites of bladder ([place]) # High-grade transitional cell carcinoma of the bladder metastatic to retroperitoneal lymph node.  Stage IV; NOV 1s, 2021- CT-chest and pelvis-noncontrast- STABLE. No findings of recurrent malignancy; Bilateral Ground glass opacities noted [see below]  # continue to HOLD opdivo today- given poor tolerance. Labs today reviewed-see below. Continue t to hold further immunotherapy for now. Will order scan today.  # Unsteady gait/ no falls-STABLE;  ? Secondary to opdivo [no evidence of adrenal insuffiency]-continue/taper 10 mg/day.   # Incidentral [NOv 1st, 2021-CT new ground-glass opacities in both lungs primarily-asymptomatic.  Chest x-ray July 01, 2020 stable. Will order scan at next visit.   # Iatrogenic hypothyroidism-on Synthroid 88 mcg; TSH JAN 2022- WNL.   # Anemia sec to CKD/ on Iv iron.Hb-9.1 STABLE;  iron sats- 11% [NOV 2021];STABLE; s/p cysto [02/11/20]; HOLD venofer today. Iron sat-53%.   # incidental 2 mm enhancing focus along the anterior right frontal lobe- clinically likely vascular enhancement; less likely metastasis.  Will repeat MRI in 3 months.   # CKD stageIII- IV-GFR 42- STABLE.    #Hyperkalemia mild-potassium 5.3; monitor for now.  I spoke at length with the patient's family, Ronalee Belts- regarding the patient's clinical status/plan of care.  Family agreement.    # DISPOSITION:   # HOLD VENOFER # Follow-up in 4 weeks- MD labs-cbc/cmp/possible venofer; CT C/A/P prior- Dr.B   All questions were answered. The patient knows to call the clinic with any problems, questions or concerns.    Cammie Sickle, MD 08/16/2020 12:27 PM

## 2020-08-13 NOTE — Assessment & Plan Note (Addendum)
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  Stage IV; NOV 1s, 2021- CT-chest and pelvis-noncontrast- STABLE. No findings of recurrent malignancy; Bilateral Ground glass opacities noted [see below]  # continue to HOLD opdivo today- given poor tolerance. Labs today reviewed-see below. Continue t to hold further immunotherapy for now. Will order scan today.  # Unsteady gait/ no falls-STABLE;  ? Secondary to opdivo [no evidence of adrenal insuffiency]-continue/taper 10 mg/day.   # Incidentral [NOv 1st, 2021-CT new ground-glass opacities in both lungs primarily-asymptomatic.  Chest x-ray July 01, 2020 stable. Will order scan at next visit.   # Iatrogenic hypothyroidism-on Synthroid 88 mcg; TSH JAN 2022- WNL.   # Anemia sec to CKD/ on Iv iron.Hb-9.1 STABLE;  iron sats- 11% [NOV 2021];STABLE; s/p cysto [02/11/20]; HOLD venofer today. Iron sat-53%.   # incidental 2 mm enhancing focus along the anterior right frontal lobe- clinically likely vascular enhancement; less likely metastasis. Will repeat MRI in 3 months.   # CKD stageIII- IV-GFR 42- STABLE.    #Hyperkalemia mild-potassium 5.3; monitor for now.  I spoke at length with the patient's family, Ronalee Belts- regarding the patient's clinical status/plan of care.  Family agreement.    # DISPOSITION:   # HOLD VENOFER # Follow-up in 4 weeks- MD labs-cbc/cmp/possible venofer; CT C/A/P prior- Dr.B

## 2020-08-18 ENCOUNTER — Other Ambulatory Visit: Payer: Self-pay | Admitting: Urology

## 2020-08-18 ENCOUNTER — Encounter: Payer: Self-pay | Admitting: Urology

## 2020-08-23 ENCOUNTER — Emergency Department
Admission: EM | Admit: 2020-08-23 | Discharge: 2020-08-23 | Disposition: A | Discharge status: Home / Self Care | Payer: Medicare HMO | Attending: Emergency Medicine | Admitting: Emergency Medicine

## 2020-08-23 ENCOUNTER — Other Ambulatory Visit: Payer: Self-pay

## 2020-08-23 DIAGNOSIS — Z5321 Procedure and treatment not carried out due to patient leaving prior to being seen by health care provider: Secondary | ICD-10-CM | POA: Diagnosis not present

## 2020-08-23 DIAGNOSIS — Z8551 Personal history of malignant neoplasm of bladder: Secondary | ICD-10-CM | POA: Diagnosis not present

## 2020-08-23 DIAGNOSIS — I129 Hypertensive chronic kidney disease with stage 1 through stage 4 chronic kidney disease, or unspecified chronic kidney disease: Secondary | ICD-10-CM | POA: Diagnosis not present

## 2020-08-23 DIAGNOSIS — R222 Localized swelling, mass and lump, trunk: Secondary | ICD-10-CM | POA: Diagnosis not present

## 2020-08-23 DIAGNOSIS — K469 Unspecified abdominal hernia without obstruction or gangrene: Secondary | ICD-10-CM | POA: Diagnosis not present

## 2020-08-23 DIAGNOSIS — R9431 Abnormal electrocardiogram [ECG] [EKG]: Secondary | ICD-10-CM | POA: Diagnosis not present

## 2020-08-23 DIAGNOSIS — C679 Malignant neoplasm of bladder, unspecified: Secondary | ICD-10-CM | POA: Diagnosis not present

## 2020-08-23 DIAGNOSIS — R188 Other ascites: Secondary | ICD-10-CM | POA: Diagnosis not present

## 2020-08-23 DIAGNOSIS — E875 Hyperkalemia: Secondary | ICD-10-CM | POA: Diagnosis not present

## 2020-08-23 DIAGNOSIS — R9341 Abnormal radiologic findings on diagnostic imaging of renal pelvis, ureter, or bladder: Secondary | ICD-10-CM | POA: Diagnosis not present

## 2020-08-23 DIAGNOSIS — N183 Chronic kidney disease, stage 3 unspecified: Secondary | ICD-10-CM | POA: Diagnosis not present

## 2020-08-23 DIAGNOSIS — K4020 Bilateral inguinal hernia, without obstruction or gangrene, not specified as recurrent: Secondary | ICD-10-CM | POA: Diagnosis not present

## 2020-08-23 DIAGNOSIS — N329 Bladder disorder, unspecified: Secondary | ICD-10-CM | POA: Diagnosis not present

## 2020-08-23 DIAGNOSIS — R000 Tachycardia, unspecified: Secondary | ICD-10-CM | POA: Diagnosis not present

## 2020-08-23 DIAGNOSIS — R1900 Intra-abdominal and pelvic swelling, mass and lump, unspecified site: Secondary | ICD-10-CM | POA: Diagnosis not present

## 2020-08-23 DIAGNOSIS — K4090 Unilateral inguinal hernia, without obstruction or gangrene, not specified as recurrent: Secondary | ICD-10-CM | POA: Diagnosis not present

## 2020-08-23 NOTE — ED Triage Notes (Addendum)
Pt comes with c/o possible hernia. Pt states he was straining and noticed a large lump came up and is still out.  Pt denies any belly pain. Pt denies any dizziness, CP or SOB.  Pt states stage 4 bladder cancer. Pt states last treatment 4 weeks ago.

## 2020-08-23 NOTE — ED Notes (Signed)
Spoke with MD Joni Fears no orders at this time.

## 2020-08-25 ENCOUNTER — Other Ambulatory Visit: Payer: Self-pay

## 2020-08-25 ENCOUNTER — Ambulatory Visit (INDEPENDENT_AMBULATORY_CARE_PROVIDER_SITE_OTHER): Payer: Medicare HMO | Admitting: Family Medicine

## 2020-08-25 ENCOUNTER — Encounter: Payer: Self-pay | Admitting: Family Medicine

## 2020-08-25 VITALS — BP 144/58 | HR 72 | Ht 69.0 in | Wt 120.1 lb

## 2020-08-25 DIAGNOSIS — K4090 Unilateral inguinal hernia, without obstruction or gangrene, not specified as recurrent: Secondary | ICD-10-CM | POA: Insufficient documentation

## 2020-08-25 NOTE — Progress Notes (Signed)
Established Patient Office Visit  SUBJECTIVE:  Subjective  Patient ID: Edward Cummings, male    DOB: 1933/02/02  Age: 87 y.o. MRN: [id number]  CC:  Chief Complaint  Patient presents with  . Follow-up    Patient is here for follow up from hospital. Patient pushed hernia out during a bowel movement. Patient still has complaints of constipation with taking 3 stool softeners a day.     HPI Edward Cummings is a 87 y.o. male presenting today for     Past Medical History:  Diagnosis Date  . Anemia   . Anxiety 12/31/2019  . Cancer ([place])    bladder  . Chronic kidney disease   . Depression   . Hypertension   . Neuromuscular disorder ([place])    Nerve damage to left face/eye since around 23-Sep-2000.    Past Surgical History:  Procedure Laterality Date  . CYSTOSCOPY W/ RETROGRADES Bilateral 01/25/2018   Procedure: CYSTOSCOPY WITH RETROGRADE PYELOGRAM;  [name], MD;  [name];  Service: Urology;  Laterality: Bilateral;  . CYSTOSCOPY W/ URETERAL STENT PLACEMENT Bilateral 01/06/2019   Procedure: CYSTOSCOPY WITH RETROGRADE PYELOGRAM/URETERAL STENT REMOVAL;  [name], MD;  [name];  Service: Urology;  Laterality: Bilateral;  . CYSTOSCOPY WITH STENT PLACEMENT Bilateral 01/25/2018   Procedure: CYSTOSCOPY WITH STENT PLACEMENT;  [name], MD;  [name];  Service: Urology;  Laterality: Bilateral;  . DORSAL SLIT N/A 01/06/2019   Procedure: DORSAL SLIT;  [name], MD;  [name];  Service: Urology;  [name];  . ESOPHAGOGASTRODUODENOSCOPY (EGD) WITH PROPOFOL N/A 11/12/2019   Procedure: ESOPHAGOGASTRODUODENOSCOPY (EGD) WITH PROPOFOL;  Surgeon: Virgel Manifold, MD;  Location: [facility];  Service: Endoscopy;  [name];  . EYE SURGERY     Cornea transplants bilaterally & cataract surgery.  . TRANSURETHRAL RESECTION OF BLADDER TUMOR N/A 01/25/2018   Procedure: TRANSURETHRAL RESECTION  OF BLADDER TUMOR (TURBT);  [name], MD;  [name];  Service: Urology;  [name];    Family History  Problem Relation Age of Onset  . Prostate cancer Neg Hx   . Kidney cancer Neg Hx   . Bladder Cancer Neg Hx     Social History   Socioeconomic History  . Marital status: Married    Spouse name: [name]  . Number of children: Not on file  . Years of education: Not on file  . Highest education level: Not on file  Occupational History  . Not on file  Tobacco Use  . Smoking status: Former Smoker    Types: Cigarettes  . Smokeless tobacco: Current User    Types: Chew  . Tobacco comment: Stopped approximately 10 years ago.  Vaping Use  . Vaping Use: Never used  Substance and Sexual Activity  . Alcohol use: Not Currently    Alcohol/week: 2.0 standard drinks    Types: 2 Cans of beer per week    Comment: Daily  . Drug use: Never  . Sexual activity: Yes    Birth control/protection: None  Other Topics Concern  . Not on file  Social History Narrative    lives in [place]; with son. Wife died in 09/24/18.  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     Social Determinants of Health   Financial Resource Strain: Not on file  Food Insecurity: Not on file  Transportation Needs: Not on file  Physical Activity: Not  on file  Stress: Not on file  Social Connections: Not on file  Intimate Partner Violence: Not on file     Current Outpatient Medications:  .  aspirin EC 81 MG tablet, Take 81 mg by mouth daily. , Disp: , Rfl:  .  docusate sodium (COLACE) 50 MG capsule, Take 50 mg by mouth at bedtime., Disp: , Rfl:  .  fexofenadine (ALLEGRA) 180 MG tablet, Take 180 mg by mouth daily. , Disp: , Rfl:  .  fluticasone (FLONASE) 50 MCG/ACT nasal spray, Place 2 sprays into both nostrils daily as needed for allergies or rhinitis., Disp: 11.1 mL, Rfl: 6 .  levothyroxine (SYNTHROID) 88 MCG tablet, Take 1 tablet (88 mcg total) by mouth  daily before breakfast. Empty stomach- 1 hour prior to breakfast., Disp: 90 tablet, Rfl: 3 .  loratadine (CLARITIN) 10 MG tablet, Take 1 tablet (10 mg total) by mouth daily., Disp: 30 tablet, Rfl: 11 .  Multiple Vitamin (MULTIVITAMIN WITH MINERALS) TABS tablet, Take 1 tablet by mouth daily. , Disp: , Rfl:  .  oxybutynin (DITROPAN) 5 MG tablet, TAKE 1 TABLET BY MOUTH 3 TIMES A DAY AS NEEDED, Disp: 90 tablet, Rfl: 0 .  prednisoLONE acetate (PRED FORTE) 1 % ophthalmic suspension, Place 1 drop into both eyes daily., Disp: , Rfl:  .  predniSONE (DELTASONE) 20 MG tablet, Take 0.5 tablets (10 mg total) by mouth daily with breakfast., Disp: 20 tablet, Rfl: 0 .  tamsulosin (FLOMAX) 0.4 MG CAPS capsule, TAKE 1 CAPSULE BY MOUTH ONCE DAILY, Disp: 90 capsule, Rfl: 1   No Known Allergies  ROS Review of Systems  Constitutional: Negative.   HENT: Negative.   Respiratory: Negative.   Gastrointestinal: Negative for abdominal pain, blood in stool, constipation and rectal pain.  Genitourinary: Positive for decreased urine volume and scrotal swelling. Negative for testicular pain and urgency.  Musculoskeletal: Negative.   Neurological: Negative.   Psychiatric/Behavioral: Negative.      OBJECTIVE:    Physical Exam Vitals and nursing note reviewed.  Constitutional:      Appearance: He is obese.  HENT:     Right Ear: Tympanic membrane normal.     Left Ear: Tympanic membrane normal.  Cardiovascular:     Rate and Rhythm: Normal rate and regular rhythm.  Genitourinary:    Comments: Left ingunal hernia- no swelling or erythema.  Musculoskeletal:        General: Normal range of motion.  Skin:    General: Skin is warm.  Neurological:     Mental Status: He is alert.  Psychiatric:        Mood and Affect: Mood normal.     BP (!) 144/58   Pulse 72   Ht 5\' 9"  (1.753 m)   Wt 120 lb 1.6 oz (54.5 kg)   BMI 17.74 kg/m  Wt Readings from Last 3 Encounters:  08/25/20 120 lb 1.6 oz (54.5 kg)  08/23/20 118  lb (53.5 kg)  08/13/20 118 lb 2 oz (53.6 kg)    Health Maintenance Due  Topic Date Due  . COVID-19 Vaccine (1) Never done  . TETANUS/TDAP  Never done  . PNA vac Low Risk Adult (2 of 2 - PCV13) 04/09/2019  . INFLUENZA VACCINE  Never done    There are no preventive care reminders to display for this patient.  CBC Latest Ref Rng & Units 08/13/2020 07/23/2020 07/09/2020  WBC 4.0 - 10.5 K/uL 10.5 12.3(H) 7.3  Hemoglobin 13.0 - 17.0 g/dL 10.0(L) 9.1(L) 9.1(L)  Hematocrit 39.0 - 52.0 % 30.5(L) 28.1(L) 28.5(L)  Platelets 150 - 400 K/uL 214 317 541(H)   CMP Latest Ref Rng & Units 08/13/2020 07/23/2020 07/09/2020  Glucose 70 - 99 mg/dL 117(H) 72 84  BUN 8 - 23 mg/dL 40(H) 31(H) 40(H)  Creatinine 0.61 - 1.24 mg/dL 2.08(H) 1.59(H) 1.88(H)  Sodium 135 - 145 mmol/L 138 139 137  Potassium 3.5 - 5.1 mmol/L 5.3(H) 4.2 5.2(H)  Chloride 98 - 111 mmol/L 105 106 106  CO2 22 - 32 mmol/L 24 25 21(L)  Calcium 8.9 - 10.3 mg/dL 9.3 9.0 9.0  Total Protein 6.5 - 8.1 g/dL 6.4(L) 5.7(L) 6.5  Total Bilirubin 0.3 - 1.2 mg/dL 0.7 0.7 0.6  Alkaline Phos 38 - 126 U/L 55 69 148(H)  AST 15 - 41 U/L 24 48(H) 48(H)  ALT 0 - 44 U/L 24 36 48(H)    Lab Results  Component Value Date   TSH 1.695 07/01/2020   Lab Results  Component Value Date   ALBUMIN 3.7 08/13/2020   ANIONGAP 9 08/13/2020   No results found for: CHOL, HDL, LDLCALC, CHOLHDL No results found for: TRIG Lab Results  Component Value Date   HGBA1C 4.5 (L) 04/13/2018      ASSESSMENT & PLAN:   Problem List Items Addressed This Visit      Other   Hernia, inguinal, left - Primary    Left inguinal hernia improved 3 days after going to ED for hernia that had swelling without going down after having a bowel movement, he went to ED where they reduced the hernia for him. No Pain at this time.   Plan- Discussed the use of stool softeners and need to drink a full glass of water. Will continue to monitor. Considering his stage IV bladder carcinoma he is not  going to consider surgery at this time.          No orders of the defined types were placed in this encounter.     Follow-up: No follow-ups on file.    Beckie Salts, [place] [address] [place], [place] [address]

## 2020-08-25 NOTE — Assessment & Plan Note (Signed)
Left inguinal hernia improved 3 days after going to ED for hernia that had swelling without going down after having a bowel movement, he went to ED where they reduced the hernia for him. No Pain at this time.   Plan- Discussed the use of stool softeners and need to drink a full glass of water. Will continue to monitor. Considering his stage IV bladder carcinoma he is not going to consider surgery at this time.

## 2020-08-27 ENCOUNTER — Telehealth: Payer: Self-pay | Admitting: *Deleted

## 2020-08-27 ENCOUNTER — Ambulatory Visit
Admission: RE | Admit: 2020-08-27 | Discharge: 2020-08-27 | Disposition: A | Discharge status: Home / Self Care | Payer: Self-pay | Source: Ambulatory Visit | Attending: Internal Medicine | Admitting: Internal Medicine

## 2020-08-27 DIAGNOSIS — C678 Malignant neoplasm of overlapping sites of bladder: Secondary | ICD-10-CM

## 2020-08-27 NOTE — Telephone Encounter (Signed)
Ronalee Belts called reporting that patient had a CT Abdomen at [facility] the other day and that he has CT ordered at [facility] 3/21 and is asking if he still needs to keep that appointment. I discussed with Kerin Ransom, RN who agrees that he does not need a CT of Abdominal/pelvis, But he does need to keep the appointment so that a CT of Chest can be done and that he will not need to pick up the prep. She will request [facility] to power share the images so that our radiologists can see them and she will message Colletta Maryland to update her on changes and she will also discuss this with Dr Janese Banks who is covering in Dr Darlin Priestly absence. Call returned to [facility] and advised of this and he stated he was not aware that there was a Chest CT ordered and he will have patient at appointment Monday and informed that he will not need to pick up prep for scan. He thanked me for letting him know

## 2020-08-27 NOTE — Telephone Encounter (Signed)
Order entered for outside images to be attached to chart (once powershared into epic).  We will need to call [phone] [facility] on Monday to confirm that powershare was received.

## 2020-08-27 NOTE — Telephone Encounter (Signed)
Fax sent to [facility] radiology - fax [phone] to request power share.

## 2020-08-27 NOTE — Addendum Note (Signed)
Addended by: Gloris Ham on: 08/27/2020 02:04 PM   Modules accepted: Orders

## 2020-08-27 NOTE — Telephone Encounter (Signed)
Edward Cummings in Ct aware of the plan of care. Jennifer contacted Peggy in scheduling, who will unlink the ct abd and pelvis order.

## 2020-08-27 NOTE — Telephone Encounter (Signed)
Received incoming call from [place], [place] / triage.  The patient had a ct of the abd and pelvis at [facility] this week. He is down for a chest abd and pelvis on Monday. I need to cancel the abd and pelvis as this is not needed, but he still needs to go for the Chest CT. I will work on getting the images uploaded/power shared in our system from [facility].

## 2020-08-30 ENCOUNTER — Ambulatory Visit
Admission: RE | Admit: 2020-08-30 | Discharge: 2020-08-30 | Disposition: A | Discharge status: Home / Self Care | Payer: Medicare HMO | Source: Ambulatory Visit | Attending: Internal Medicine | Admitting: Internal Medicine

## 2020-08-30 ENCOUNTER — Other Ambulatory Visit: Payer: Self-pay

## 2020-08-30 DIAGNOSIS — J8410 Pulmonary fibrosis, unspecified: Secondary | ICD-10-CM | POA: Diagnosis not present

## 2020-08-30 DIAGNOSIS — I2510 Atherosclerotic heart disease of native coronary artery without angina pectoris: Secondary | ICD-10-CM | POA: Diagnosis not present

## 2020-08-30 DIAGNOSIS — J479 Bronchiectasis, uncomplicated: Secondary | ICD-10-CM | POA: Diagnosis not present

## 2020-08-30 DIAGNOSIS — C678 Malignant neoplasm of overlapping sites of bladder: Secondary | ICD-10-CM | POA: Insufficient documentation

## 2020-08-30 DIAGNOSIS — J439 Emphysema, unspecified: Secondary | ICD-10-CM | POA: Diagnosis not present

## 2020-08-30 IMAGING — CT CT CHEST W/O CM
2 of 4 series · 15 of 36 positions shown, 18 images · non-contrast
Comparison: [DATE]

CLINICAL DATA: Bladder cancer restaging

EXAM:
CT CHEST WITHOUT CONTRAST
TECHNIQUE: Multidetector CT imaging of the chest was performed following the
standard protocol without IV contrast.

[Series 2: chest 2.00 · axial · 0.57mm/px · z∈[-1211,-915]mm · 12 of 176 slices shown, 15 images]
[im 14/176  mediastinal]
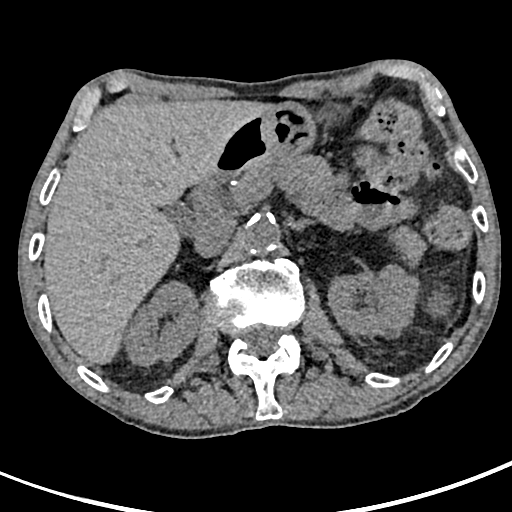
[im 14/176  lung]
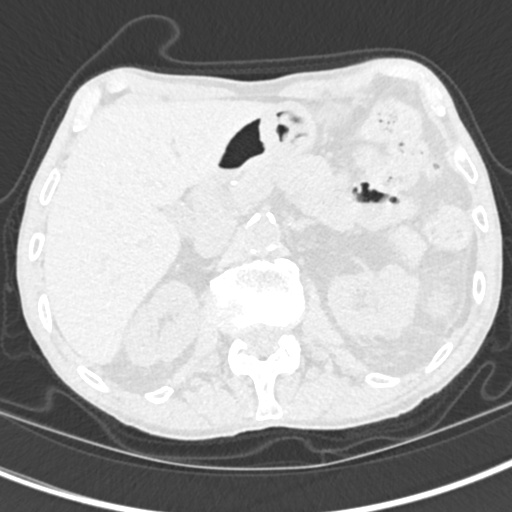
[im 27/176  lung]
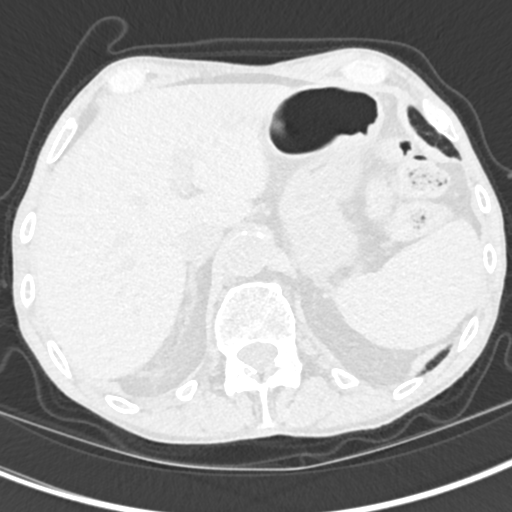
[im 41/176  lung]
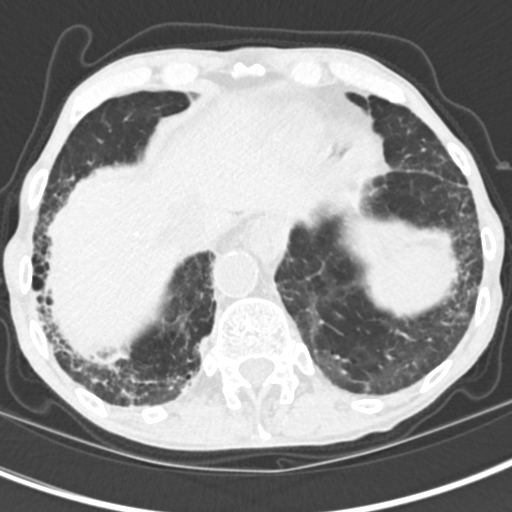
[im 54/176  lung]
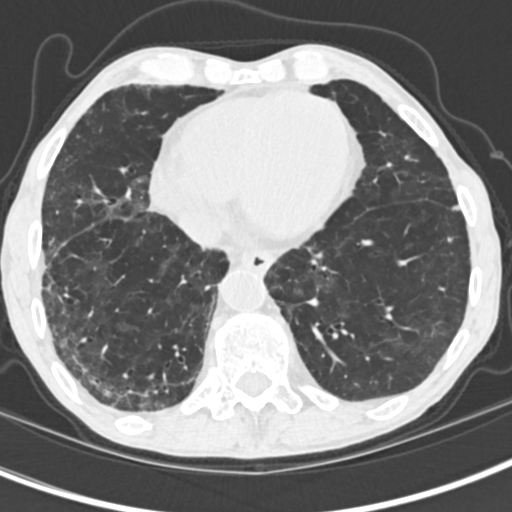
[im 68/176  mediastinal]
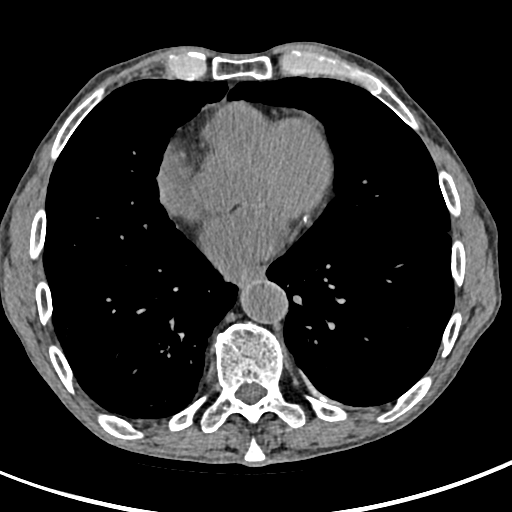
[im 68/176  lung]
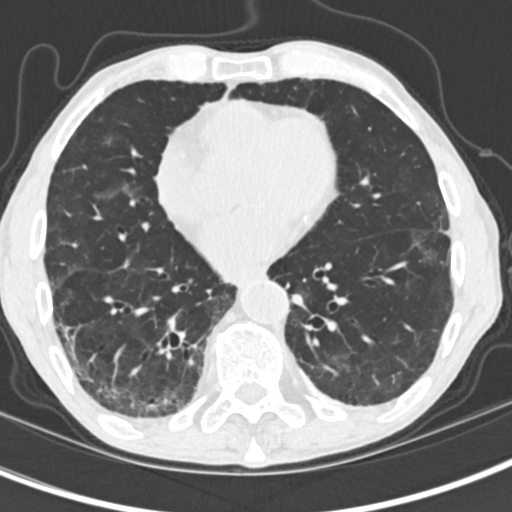
[im 81/176  lung]
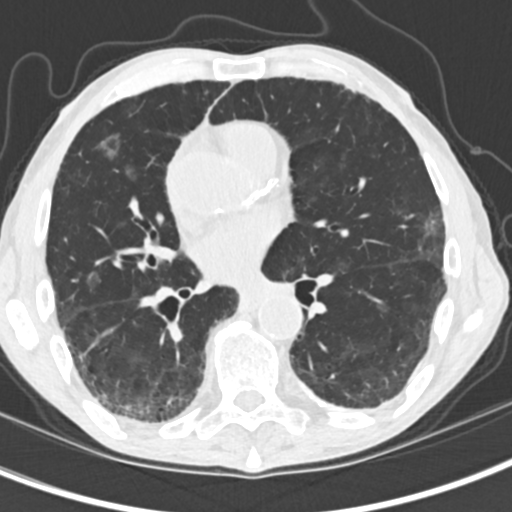
[im 95/176  lung]
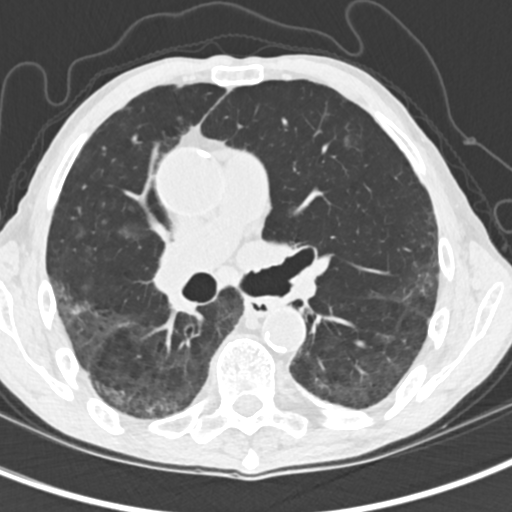
[im 108/176  lung]
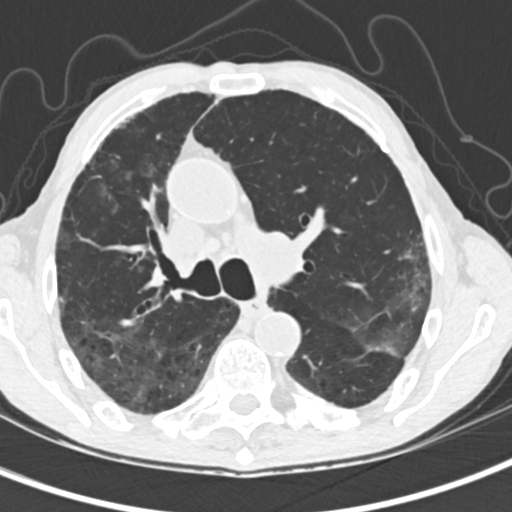
[im 122/176  mediastinal]
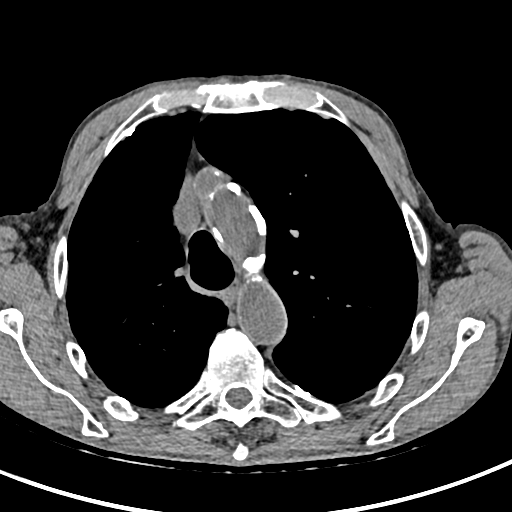
[im 122/176  lung]
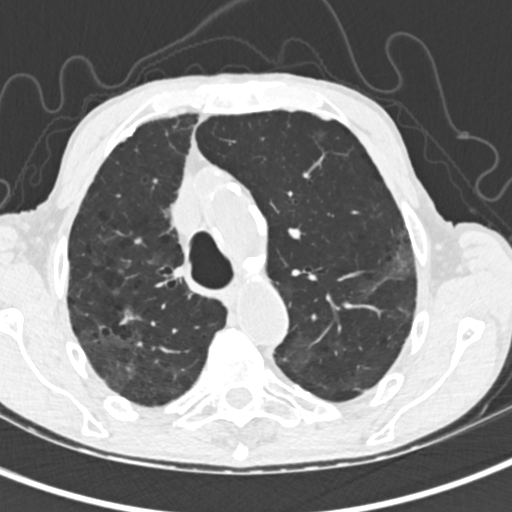
[im 135/176  lung]
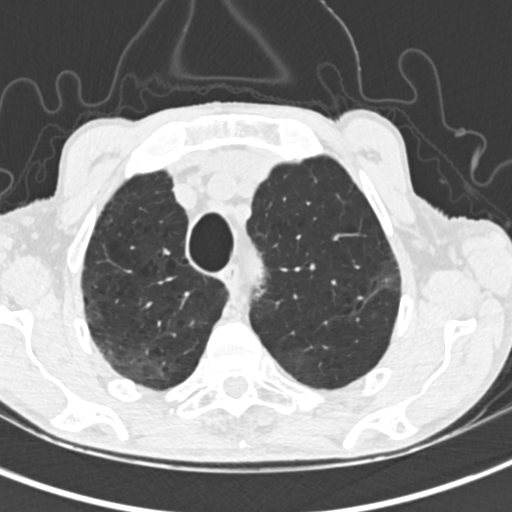
[im 149/176  lung]
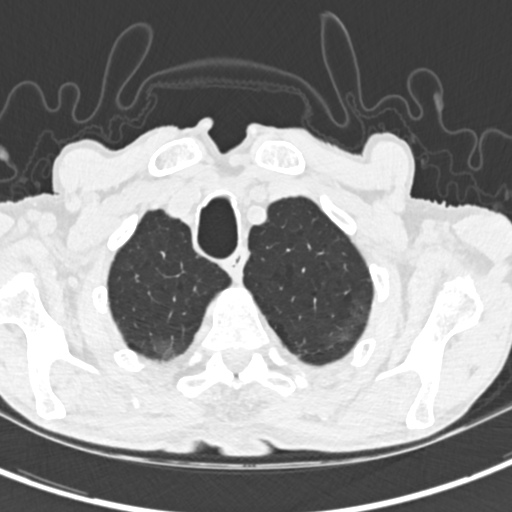
[im 162/176  lung]
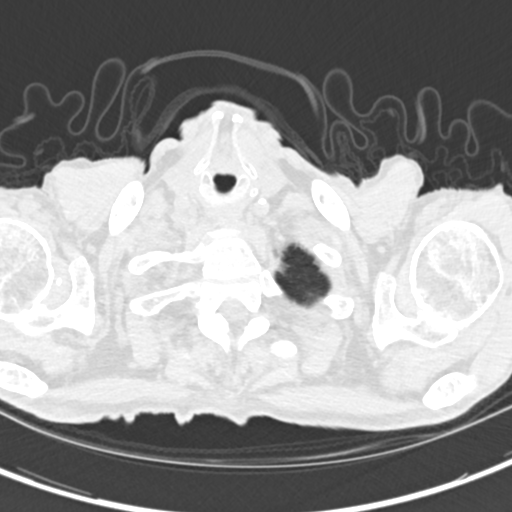

[Series 5: coronals chest 2.00 cor · coronal · 0.57mm/px · 3 of 123 slices shown]
[im 25/123  lung]
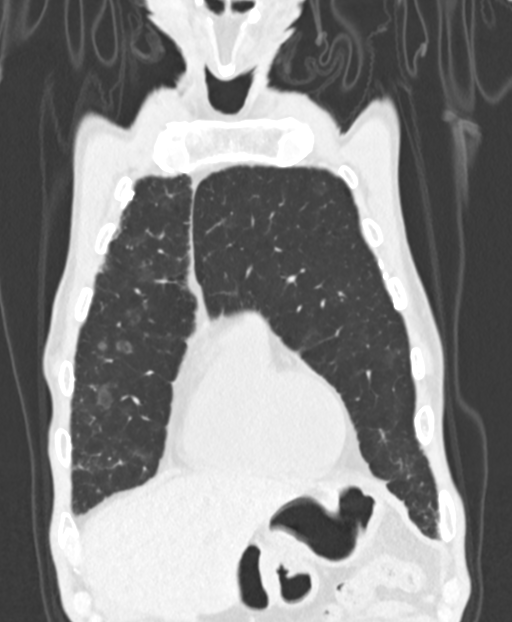
[im 49/123  lung]
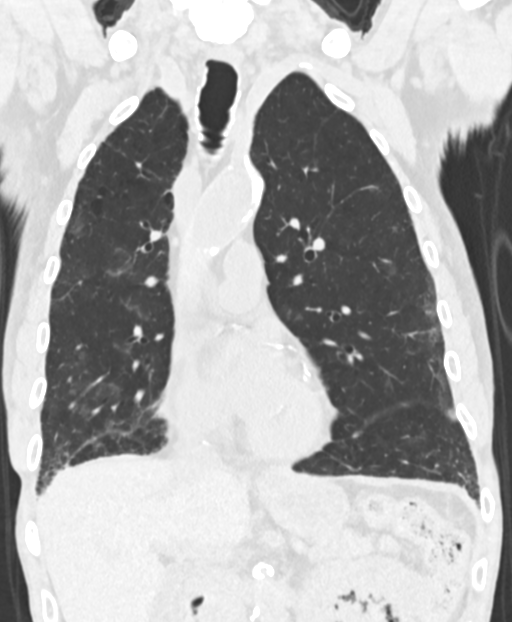
[im 74/123  lung]
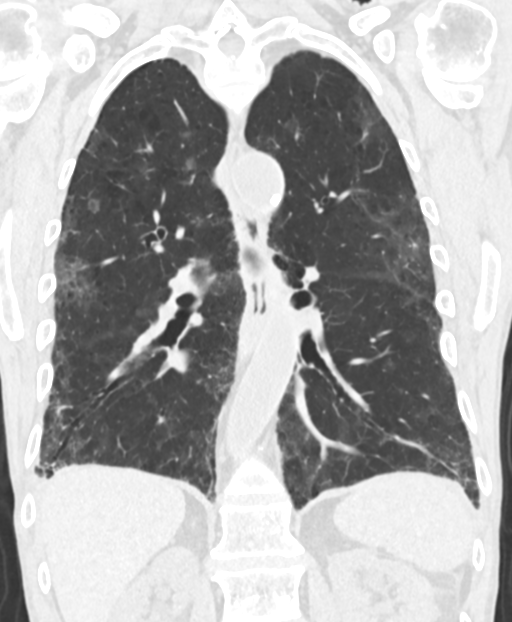

[15 of 36 positions shown; findings below may reference images not displayed]

FINDINGS: Cardiovascular: Coronary, aortic arch, and branch vessel
atherosclerotic vascular disease. Heart size within normal limits.

Mediastinum/Nodes: Scattered small mediastinal lymph nodes are not
pathologically enlarged.

Lungs/Pleura: Emphysema. Mild biapical pleuroparenchymal scarring.
Peripheral fibrosis in the lung bases, right greater than left.
Scattered mosaic attenuation, the pattern of patchy ground-glass
opacities is different from prior, and potentially manifestation of
acute hypersensitivity, or atypical infection. Background UIP is a
possibility although is not thought to be the only explanation for
the current mosaic attenuation.

Stable 5 mm sub solid nodule in the left lower lobe on image 132
series 3.

Pleural calcifications compatible with remote asbestos exposure.
Cylindrical bronchiectasis particularly in the lower lobes. No
findings characteristic for metastatic transitional cell carcinoma
in the lungs.

Upper Abdomen: Abdominal aortic atherosclerosis.

Musculoskeletal: Thoracic spondylosis.
IMPRESSION: 1. No findings of metastatic transitional cell carcinoma in the
lungs.
2. Scattered mosaic attenuation in the lungs, with some scattered
ground-glass opacities, potentially a manifestation of acute
hypersensitivity, or atypical infection. Background UIP is a
possibility although is not thought to be the only explanation for
the current mosaic attenuation.
3. Coronary, aortic arch, and branch vessel atherosclerotic vascular
disease.
4. Pleural calcifications compatible with remote asbestos exposure.
5. Cylindrical bronchiectasis in the lower lobes.
6. Stable 5 mm sub solid nodule in the left lower lobe.
7. Emphysema and aortic atherosclerosis.

Aortic Atherosclerosis ([KX]-[KX]) and Emphysema ([KX]-[KX]).

## 2020-08-31 NOTE — Telephone Encounter (Signed)
Left vm for [employer] to connect outside images from [facility] via power share.

## 2020-09-01 ENCOUNTER — Encounter: Payer: Self-pay | Admitting: Urology

## 2020-09-01 ENCOUNTER — Ambulatory Visit: Payer: Medicare HMO | Admitting: Urology

## 2020-09-01 ENCOUNTER — Other Ambulatory Visit: Payer: Self-pay

## 2020-09-01 VITALS — BP 154/65 | HR 77 | Ht 69.0 in | Wt 118.0 lb

## 2020-09-01 DIAGNOSIS — C679 Malignant neoplasm of bladder, unspecified: Secondary | ICD-10-CM | POA: Diagnosis not present

## 2020-09-01 NOTE — H&P (View-Only) (Signed)
09/01/2020 3:45 PM   Edward Cummings 1932/10/06 [id number]  Referring provider: [name], MD [address] [place],  [place] [address]  Chief Complaint  Patient presents with  . Cysto    HPI: 87 y.o. male with history of metastatic urothelial carcinoma the bladder who on surveillance cystoscopy today found to have papillary lesions in the posterior wall.  PMH: Past Medical History:  Diagnosis Date  . Anemia   . Anxiety 12/31/2019  . Cancer ([place])    bladder  . Chronic kidney disease   . Depression   . Hypertension   . Neuromuscular disorder ([place])    Nerve damage to left face/eye since around 2002.    Surgical History: Past Surgical History:  Procedure Laterality Date  . CYSTOSCOPY W/ RETROGRADES Bilateral 01/25/2018   Procedure: CYSTOSCOPY WITH RETROGRADE PYELOGRAM;  [name], MD;  [name];  Service: Urology;  Laterality: Bilateral;  . CYSTOSCOPY W/ URETERAL STENT PLACEMENT Bilateral 01/06/2019   Procedure: CYSTOSCOPY WITH RETROGRADE PYELOGRAM/URETERAL STENT REMOVAL;  [name], MD;  [name];  Service: Urology;  Laterality: Bilateral;  . CYSTOSCOPY WITH STENT PLACEMENT Bilateral 01/25/2018   Procedure: CYSTOSCOPY WITH STENT PLACEMENT;  [name], MD;  [name];  Service: Urology;  Laterality: Bilateral;  . DORSAL SLIT N/A 01/06/2019   Procedure: DORSAL SLIT;  [name], MD;  [name];  Service: Urology;  [name];  . ESOPHAGOGASTRODUODENOSCOPY (EGD) WITH PROPOFOL N/A 11/12/2019   Procedure: ESOPHAGOGASTRODUODENOSCOPY (EGD) WITH PROPOFOL;  Surgeon: Virgel Manifold, MD;  Location: [facility];  Service: Endoscopy;  [name];  . EYE SURGERY     Cornea transplants bilaterally & cataract surgery.  . TRANSURETHRAL RESECTION OF BLADDER TUMOR N/A 01/25/2018   Procedure: TRANSURETHRAL RESECTION OF BLADDER TUMOR (TURBT);  [name], MD;   [name];  Service: Urology;  [name];    Home Medications:  Allergies as of 09/01/2020   No Known Allergies     Medication List       Accurate as of September 01, 2020  3:45 PM. If you have any questions, ask your Cummings or doctor.        aspirin EC 81 MG tablet Take 81 mg by mouth daily.   docusate sodium 50 MG capsule Commonly known as: COLACE Take 50 mg by mouth at bedtime.   fexofenadine 180 MG tablet Commonly known as: ALLEGRA Take 180 mg by mouth daily.   fluticasone 50 MCG/ACT nasal spray Commonly known as: FLONASE Place 2 sprays into both nostrils daily as needed for allergies or rhinitis.   levothyroxine 88 MCG tablet Commonly known as: Synthroid Take 1 tablet (88 mcg total) by mouth daily before breakfast. Empty stomach- 1 hour prior to breakfast.   loratadine 10 MG tablet Commonly known as: CLARITIN Take 1 tablet (10 mg total) by mouth daily.   multivitamin with minerals Tabs tablet Take 1 tablet by mouth daily.   oxybutynin 5 MG tablet Commonly known as: DITROPAN TAKE 1 TABLET BY MOUTH 3 TIMES A DAY AS NEEDED   prednisoLONE acetate 1 % ophthalmic suspension Commonly known as: PRED FORTE Place 1 drop into both eyes daily.   predniSONE 20 MG tablet Commonly known as: DELTASONE Take 0.5 tablets (10 mg total) by mouth daily with breakfast.   tamsulosin 0.4 MG Caps capsule Commonly known as: FLOMAX TAKE 1 CAPSULE BY MOUTH ONCE DAILY       Allergies:  No Known Allergies  Family History: Family History  Problem Relation Age of Onset  . Prostate cancer Neg Hx   . Kidney cancer Neg Hx   . Bladder Cancer Neg Hx     Social History:  reports that he has quit smoking. His smoking use included cigarettes. His smokeless tobacco use includes chew. He reports previous alcohol use of about 2.0 standard drinks of alcohol per week. He reports that he does not use drugs.   Physical Exam: BP (!) 154/65   Pulse 77   Ht 5\' 9"  (1.753 m)   Wt  118 lb (53.5 kg)   BMI 17.43 kg/m   Constitutional:  Alert and oriented, No acute distress. HEENT: [name], moist mucus membranes.  Trachea midline, no masses. Cardiovascular: No clubbing, cyanosis, or edema.  RRR Respiratory: Normal respiratory effort, no increased work of breathing.  Clear GI: Abdomen is soft, nontender, nondistended, no abdominal masses GU: Phallus without lesions, testes descended bilaterally Skin: No rashes, bruises or suspicious lesions. Neurologic: Grossly intact, no focal deficits, moving all 4 extremities. Psychiatric: Normal mood and affect.   Assessment & Plan:    1. Urothelial carcinoma of bladder ([place])  Papillary lesions on surveillance cystoscopy endoscopically consistent with urothelial carcinoma  Findings were discussed in detail with Edward Cummings and recommend scheduling cystoscopy with biopsy/fulguration and possible TURBT  The procedure was discussed in detail including potential risks of bleeding, infection, injury to urethra, bladder as well as anesthetic risks.  All questions were answered and he desires to schedule   Abbie Sons, MD  [place] [address] [place], [place] [address] [phone]

## 2020-09-01 NOTE — Progress Notes (Signed)
09/01/2020 3:45 PM   [name] [birth year] [id number]  Referring provider: [name], MD [address] [place],  [place] [address]  Chief Complaint  Patient presents with  . Cysto    HPI: 87 y.o. male with history of metastatic urothelial carcinoma the bladder who on surveillance cystoscopy today found to have papillary lesions in the posterior wall.  PMH: Past Medical History:  Diagnosis Date  . Anemia   . Anxiety 12/31/2019  . Cancer ([place])    bladder  . Chronic kidney disease   . Depression   . Hypertension   . Neuromuscular disorder ([place])    Nerve damage to left face/eye since around 2002.    Surgical History: Past Surgical History:  Procedure Laterality Date  . CYSTOSCOPY W/ RETROGRADES Bilateral 01/25/2018   Procedure: CYSTOSCOPY WITH RETROGRADE PYELOGRAM;  [name], MD;  [name];  Service: Urology;  Laterality: Bilateral;  . CYSTOSCOPY W/ URETERAL STENT PLACEMENT Bilateral 01/06/2019   Procedure: CYSTOSCOPY WITH RETROGRADE PYELOGRAM/URETERAL STENT REMOVAL;  [name], MD;  [name];  Service: Urology;  Laterality: Bilateral;  . CYSTOSCOPY WITH STENT PLACEMENT Bilateral 01/25/2018   Procedure: CYSTOSCOPY WITH STENT PLACEMENT;  [name], MD;  [name];  Service: Urology;  Laterality: Bilateral;  . DORSAL SLIT N/A 01/06/2019   Procedure: DORSAL SLIT;  [name], MD;  [name];  Service: Urology;  [name];  . ESOPHAGOGASTRODUODENOSCOPY (EGD) WITH PROPOFOL N/A 11/12/2019   Procedure: ESOPHAGOGASTRODUODENOSCOPY (EGD) WITH PROPOFOL;  Surgeon: Virgel Manifold, MD;  Location: [facility];  Service: Endoscopy;  [name];  . EYE SURGERY     Cornea transplants bilaterally & cataract surgery.  . TRANSURETHRAL RESECTION OF BLADDER TUMOR N/A 01/25/2018   Procedure: TRANSURETHRAL RESECTION OF BLADDER TUMOR (TURBT);  [name], MD;   [name];  Service: Urology;  [name];    Home Medications:  Allergies as of 09/01/2020   No Known Allergies     Medication List       Accurate as of September 01, 2020  3:45 PM. If you have any questions, ask your nurse or doctor.        aspirin EC 81 MG tablet Take 81 mg by mouth daily.   docusate sodium 50 MG capsule Commonly known as: COLACE Take 50 mg by mouth at bedtime.   fexofenadine 180 MG tablet Commonly known as: ALLEGRA Take 180 mg by mouth daily.   fluticasone 50 MCG/ACT nasal spray Commonly known as: FLONASE Place 2 sprays into both nostrils daily as needed for allergies or rhinitis.   levothyroxine 88 MCG tablet Commonly known as: Synthroid Take 1 tablet (88 mcg total) by mouth daily before breakfast. Empty stomach- 1 hour prior to breakfast.   loratadine 10 MG tablet Commonly known as: CLARITIN Take 1 tablet (10 mg total) by mouth daily.   multivitamin with minerals Tabs tablet Take 1 tablet by mouth daily.   oxybutynin 5 MG tablet Commonly known as: DITROPAN TAKE 1 TABLET BY MOUTH 3 TIMES A DAY AS NEEDED   prednisoLONE acetate 1 % ophthalmic suspension Commonly known as: PRED FORTE Place 1 drop into both eyes daily.   predniSONE 20 MG tablet Commonly known as: DELTASONE Take 0.5 tablets (10 mg total) by mouth daily with breakfast.   tamsulosin 0.4 MG Caps capsule Commonly known as: FLOMAX TAKE 1 CAPSULE BY MOUTH ONCE DAILY       Allergies:  No Known Allergies  Family History: Family History  Problem Relation Age of Onset  . Prostate cancer Neg Hx   . Kidney cancer Neg Hx   . Bladder Cancer Neg Hx     Social History:  reports that he has quit smoking. His smoking use included cigarettes. His smokeless tobacco use includes chew. He reports previous alcohol use of about 2.0 standard drinks of alcohol per week. He reports that he does not use drugs.   Physical Exam: BP (!) 154/65   Pulse 77   Ht 5\' 9"  (1.753 m)   Wt  118 lb (53.5 kg)   BMI 17.43 kg/m   Constitutional:  Alert and oriented, No acute distress. HEENT: [place] AT, moist mucus membranes.  Trachea midline, no masses. Cardiovascular: No clubbing, cyanosis, or edema.  RRR Respiratory: Normal respiratory effort, no increased work of breathing.  Clear GI: Abdomen is soft, nontender, nondistended, no abdominal masses GU: Phallus without lesions, testes descended bilaterally Skin: No rashes, bruises or suspicious lesions. Neurologic: Grossly intact, no focal deficits, moving all 4 extremities. Psychiatric: Normal mood and affect.   Assessment & Plan:    1. Urothelial carcinoma of bladder ([place])  Papillary lesions on surveillance cystoscopy endoscopically consistent with urothelial carcinoma  Findings were discussed in detail with Mr. Oestreicher and recommend scheduling cystoscopy with biopsy/fulguration and possible TURBT  The procedure was discussed in detail including potential risks of bleeding, infection, injury to urethra, bladder as well as anesthetic risks.  All questions were answered and he desires to schedule   Abbie Sons, MD  [place] [address] [place], [place] [address] [phone]

## 2020-09-01 NOTE — Progress Notes (Signed)
   09/01/20  CC:  Chief Complaint  Patient presents with  . Cysto    HPI: 87 y.o. male with history metastatic urothelial carcinoma of the bladder on Opdivo.  Presents for surveillance cystoscopy.  He has no complaints  Blood pressure (!) 154/65, pulse 77, height 5\' 9"  (1.753 m), weight 118 lb (53.5 kg). NED. A&Ox3.   No respiratory distress   Abd soft, NT, ND Normal phallus with bilateral descended testicles  Cystoscopy Procedure Note  Patient identification was confirmed, informed consent was obtained, and patient was prepped using Betadine solution.  Lidocaine jelly was administered per urethral meatus.     Pre-Procedure: - Inspection reveals a normal caliber urethral meatus.  Procedure: The flexible cystoscope was introduced without difficulty - No urethral strictures/lesions are present. - Moderate lateral lobe large amount prostate  - Moderate elevation bladder neck - Bilateral ureteral orifices identified - Bladder mucosa  reveals scattered papillary lesions posterior wall, largest <1 cm - No bladder stones -Mild trabeculation  Retroflexion shows no abnormalities   Post-Procedure: - Patient tolerated the procedure well  Assessment/ Plan:  Bladder tumors and endoscopically consistent with urothelial carcinoma  Recommend biopsy/fulguration, possible TURBT  See accompanying office note  Abbie Sons, MD

## 2020-09-02 ENCOUNTER — Other Ambulatory Visit: Payer: Self-pay | Admitting: Internal Medicine

## 2020-09-02 LAB — MICROSCOPIC EXAMINATION
Bacteria, UA: NONE SEEN
Epithelial Cells (non renal): NONE SEEN /hpf (ref 0–10)

## 2020-09-02 LAB — URINALYSIS, COMPLETE
Bilirubin, UA: NEGATIVE
Glucose, UA: NEGATIVE
Ketones, UA: NEGATIVE
Leukocytes,UA: NEGATIVE
Nitrite, UA: NEGATIVE
Protein,UA: NEGATIVE
RBC, UA: NEGATIVE
Specific Gravity, UA: 1.015 (ref 1.005–1.030)
Urobilinogen, Ur: 0.2 mg/dL (ref 0.2–1.0)
pH, UA: 6.5 (ref 5.0–7.5)

## 2020-09-10 ENCOUNTER — Other Ambulatory Visit: Payer: Self-pay

## 2020-09-10 ENCOUNTER — Inpatient Hospital Stay: Payer: Medicare HMO

## 2020-09-10 ENCOUNTER — Inpatient Hospital Stay (HOSPITAL_BASED_OUTPATIENT_CLINIC_OR_DEPARTMENT_OTHER): Payer: Medicare HMO | Admitting: Internal Medicine

## 2020-09-10 ENCOUNTER — Encounter: Payer: Self-pay | Admitting: Internal Medicine

## 2020-09-10 ENCOUNTER — Inpatient Hospital Stay: Payer: Medicare HMO | Attending: Internal Medicine

## 2020-09-10 VITALS — BP 145/66 | HR 63 | Temp 97.8°F | Resp 20 | Ht 69.0 in | Wt 123.8 lb

## 2020-09-10 DIAGNOSIS — C678 Malignant neoplasm of overlapping sites of bladder: Secondary | ICD-10-CM | POA: Diagnosis not present

## 2020-09-10 DIAGNOSIS — Z7952 Long term (current) use of systemic steroids: Secondary | ICD-10-CM | POA: Insufficient documentation

## 2020-09-10 DIAGNOSIS — J439 Emphysema, unspecified: Secondary | ICD-10-CM | POA: Diagnosis not present

## 2020-09-10 DIAGNOSIS — K4090 Unilateral inguinal hernia, without obstruction or gangrene, not specified as recurrent: Secondary | ICD-10-CM | POA: Insufficient documentation

## 2020-09-10 DIAGNOSIS — Z87891 Personal history of nicotine dependence: Secondary | ICD-10-CM | POA: Insufficient documentation

## 2020-09-10 DIAGNOSIS — Z77090 Contact with and (suspected) exposure to asbestos: Secondary | ICD-10-CM | POA: Insufficient documentation

## 2020-09-10 DIAGNOSIS — D631 Anemia in chronic kidney disease: Secondary | ICD-10-CM | POA: Diagnosis not present

## 2020-09-10 DIAGNOSIS — E039 Hypothyroidism, unspecified: Secondary | ICD-10-CM | POA: Insufficient documentation

## 2020-09-10 DIAGNOSIS — Z79899 Other long term (current) drug therapy: Secondary | ICD-10-CM | POA: Diagnosis not present

## 2020-09-10 DIAGNOSIS — C772 Secondary and unspecified malignant neoplasm of intra-abdominal lymph nodes: Secondary | ICD-10-CM | POA: Insufficient documentation

## 2020-09-10 DIAGNOSIS — M47814 Spondylosis without myelopathy or radiculopathy, thoracic region: Secondary | ICD-10-CM | POA: Insufficient documentation

## 2020-09-10 DIAGNOSIS — Z79890 Hormone replacement therapy (postmenopausal): Secondary | ICD-10-CM | POA: Diagnosis not present

## 2020-09-10 DIAGNOSIS — I700 Atherosclerosis of aorta: Secondary | ICD-10-CM | POA: Insufficient documentation

## 2020-09-10 DIAGNOSIS — N183 Chronic kidney disease, stage 3 unspecified: Secondary | ICD-10-CM | POA: Insufficient documentation

## 2020-09-10 DIAGNOSIS — I129 Hypertensive chronic kidney disease with stage 1 through stage 4 chronic kidney disease, or unspecified chronic kidney disease: Secondary | ICD-10-CM | POA: Insufficient documentation

## 2020-09-10 DIAGNOSIS — R9389 Abnormal findings on diagnostic imaging of other specified body structures: Secondary | ICD-10-CM | POA: Diagnosis not present

## 2020-09-10 LAB — COMPREHENSIVE METABOLIC PANEL
ALT: 16 U/L (ref 0–44)
AST: 19 U/L (ref 15–41)
Albumin: 3.5 g/dL (ref 3.5–5.0)
Alkaline Phosphatase: 51 U/L (ref 38–126)
Anion gap: 7 (ref 5–15)
BUN: 31 mg/dL — ABNORMAL HIGH (ref 8–23)
CO2: 24 mmol/L (ref 22–32)
Calcium: 9 mg/dL (ref 8.9–10.3)
Chloride: 107 mmol/L (ref 98–111)
Creatinine, Ser: 1.85 mg/dL — ABNORMAL HIGH (ref 0.61–1.24)
GFR, Estimated: 35 mL/min — ABNORMAL LOW (ref >60)
Glucose, Bld: 115 mg/dL — ABNORMAL HIGH (ref 70–99)
Potassium: 4.8 mmol/L (ref 3.5–5.1)
Sodium: 138 mmol/L (ref 135–145)
Total Bilirubin: 0.6 mg/dL (ref 0.3–1.2)
Total Protein: 6.2 g/dL — ABNORMAL LOW (ref 6.5–8.1)

## 2020-09-10 LAB — CBC WITH DIFFERENTIAL/PLATELET
Abs Immature Granulocytes: 0.05 10*3/uL (ref 0.00–0.07)
Basophils Absolute: 0 10*3/uL (ref 0.0–0.1)
Basophils Relative: 0 %
Eosinophils Absolute: 0.1 10*3/uL (ref 0.0–0.5)
Eosinophils Relative: 1 %
HCT: 30.7 % — ABNORMAL LOW (ref 39.0–52.0)
Hemoglobin: 10.2 g/dL — ABNORMAL LOW (ref 13.0–17.0)
Immature Granulocytes: 1 %
Lymphocytes Relative: 9 %
Lymphs Abs: 1 10*3/uL (ref 0.7–4.0)
MCH: 33.7 pg (ref 26.0–34.0)
MCHC: 33.2 g/dL (ref 30.0–36.0)
MCV: 101.3 fL — ABNORMAL HIGH (ref 80.0–100.0)
Monocytes Absolute: 0.5 10*3/uL (ref 0.1–1.0)
Monocytes Relative: 5 %
Neutro Abs: 9.2 10*3/uL — ABNORMAL HIGH (ref 1.7–7.7)
Neutrophils Relative %: 84 %
Platelets: 247 10*3/uL (ref 150–400)
RBC: 3.03 MIL/uL — ABNORMAL LOW (ref 4.22–5.81)
RDW: 14 % (ref 11.5–15.5)
WBC: 10.9 10*3/uL — ABNORMAL HIGH (ref 4.0–10.5)
nRBC: 0 % (ref 0.0–0.2)

## 2020-09-10 NOTE — Progress Notes (Signed)
[place] NOTE  Patient Care Team: [name], [name], [name], MD as Consulting Physician (Gastroenterology) Clyde Canterbury, MD as Referring Physician (Otolaryngology) Ottie Glazier, MD as Consulting Physician (Pulmonary Disease)  CHIEF COMPLAINTS/PURPOSE OF CONSULTATION: Bladder cancer   Oncology History Overview Note  # AUG 2019-TRANSITIONAL CELL BLADDER CA [~ 4cm tumor] s/p cystoscopy [Dr.Stoiff]  with extensive angiolymphatic invasion; [name]; [name]; mediastinal uptake; right pubic rami uptake.  # [name]; Jan 18th 2021- switched to [place]- [pt preference; [name]; July 2020 cystoscopy-no evidence of bladder malignancy/Dr. Bernardo Heater- enlarged prostate [PSA- 0.95; 2021]  # Molecular testing- PDL-1 CPS- 20%; [name]  ;GOALS: palliative  CURRENT/MOST RECENT THERAPY:OPDIVO [C]     Cancer of overlapping sites of bladder ([place])  02/28/2018 - 06/09/2019 Chemotherapy   The patient had atezolizumab (TECENTRIQ) 1,[name], 1,200 mg, Intravenous, Once, [name],200 mg (02/28/2018), 1,200 mg (03/21/2018), 1,200 mg (04/11/2018), 1,200 mg (05/02/2018), 1,200 mg (06/13/2018), 1,200 mg (05/23/2018), 1,200 mg (07/04/2018), 1,200 mg (07/25/2018), 1,200 mg (08/15/2018), 1,200 mg (09/05/2018), 1,200 mg (10/25/2018), 1,200 mg (11/22/2018), 1,200 mg (12/20/2018), 1,200 mg (01/10/2019), 1,200 mg (01/31/2019), 1,200 mg (02/21/2019), 1,200 mg (03/14/2019), 1,200 mg (04/04/2019), 1,200 mg (04/25/2019), 1,200 mg  (05/16/2019), 1,200 mg (06/09/2019)  for chemotherapy treatment.    06/30/2019 -  Chemotherapy   The patient had nivolumab (OPDIVO) 240 mg in sodium chloride 0.9 % 100 mL chemo infusion, 240 mg, Intravenous, Once, 22 of 23 cycles Administration: 240 mg (06/30/2019), 240 mg (07/14/2019), 240 mg (07/28/2019), 240 mg (08/11/2019), 240 mg (08/25/2019), 240 mg (09/08/2019), 240 mg (09/22/2019), 240 mg (11/03/2019), 240 mg (11/17/2019), 240 mg (12/10/2019), 240 mg (12/24/2019), 240 mg (01/08/2020), 240 mg (01/22/2020), 240 mg (02/05/2020), 240 mg (02/19/2020), 240 mg (03/04/2020), 240 mg (03/18/2020), 240 mg (04/01/2020), 240 mg (04/29/2020), 240 mg (05/13/2020), 240 mg (05/27/2020), 240 mg (06/17/2020)  for chemotherapy treatment.     HISTORY OF PRESENTING ILLNESS: Edward Cummings 87 y.o.  male with metastatic transitional carcinoma of the bladder most recently on Opdivo [currently on hold because of poor tolerance] is here for follow-up/review results of the CT scan chest  Patient was recently evaluated at [facility] for concern for inguinal hernia.  CT scan did not reveal any strangulation.  However incidentally showed bladder growths.  Patient was subsequently evaluated by urology.  In the interim patient had cystoscopy that showed recurrent less than 1 cm papillary lesion concerning for recurrent cancer.  Patient is currently on prednisone 10 mg a day.  His appetite is good.  He is gaining weight.  No nausea no vomiting.  No dizzy spells.  Review of Systems  Constitutional: Positive for malaise/fatigue. Negative for chills, diaphoresis and fever.  HENT: Negative for nosebleeds and sore throat.   Eyes: Negative for double vision.  Respiratory: Negative for hemoptysis, sputum production and wheezing.   Cardiovascular: Negative for chest pain, palpitations, orthopnea and leg swelling.  Gastrointestinal: Negative for abdominal pain, blood in stool, diarrhea, heartburn, melena, nausea and vomiting.  Genitourinary: Negative for  dysuria.  Musculoskeletal: Positive for  back pain and joint pain.  Skin: Negative.  Negative for itching and rash.  Neurological: Negative for tingling, focal weakness and headaches.  Endo/Heme/Allergies: Does not bruise/bleed easily.  Psychiatric/Behavioral: Negative for depression. The patient is not nervous/anxious and does not have insomnia.      MEDICAL HISTORY:  Past Medical History:  Diagnosis Date  . Anemia   . Anxiety 12/31/2019  . Cancer ([place])    bladder  . Chronic kidney disease   . Depression   . Hypertension   . Neuromuscular disorder ([place])    Nerve damage to left face/eye since around 08-10-2000.    SURGICAL HISTORY: Past Surgical History:  Procedure Laterality Date  . CYSTOSCOPY W/ RETROGRADES Bilateral 01/25/2018   Procedure: CYSTOSCOPY WITH RETROGRADE PYELOGRAM;  [name], MD;  [name];  Service: Urology;  Laterality: Bilateral;  . CYSTOSCOPY W/ URETERAL STENT PLACEMENT Bilateral 01/06/2019   Procedure: CYSTOSCOPY WITH RETROGRADE PYELOGRAM/URETERAL STENT REMOVAL;  [name], MD;  [name];  Service: Urology;  Laterality: Bilateral;  . CYSTOSCOPY WITH STENT PLACEMENT Bilateral 01/25/2018   Procedure: CYSTOSCOPY WITH STENT PLACEMENT;  [name], MD;  [name];  Service: Urology;  Laterality: Bilateral;  . DORSAL SLIT N/A 01/06/2019   Procedure: DORSAL SLIT;  [name], MD;  [name];  Service: Urology;  [name];  . ESOPHAGOGASTRODUODENOSCOPY (EGD) WITH PROPOFOL N/A 11/12/2019   Procedure: ESOPHAGOGASTRODUODENOSCOPY (EGD) WITH PROPOFOL;  Surgeon: Virgel Manifold, MD;  Location: [facility] ENDOSCOPY;  Service: Endoscopy;  [name];  . EYE SURGERY     Cornea transplants bilaterally & cataract surgery.  . TRANSURETHRAL RESECTION OF BLADDER TUMOR N/A 01/25/2018   Procedure: TRANSURETHRAL RESECTION OF BLADDER TUMOR (TURBT);  [name], MD;  Location:  [facility] ORS;  Service: Urology;  [name];    SOCIAL HISTORY: Social History   Socioeconomic History  . Marital status: Married    Spouse name: [name]  . Number of children: Not on file  . Years of education: Not on file  . Highest education level: Not on file  Occupational History  . Not on file  Tobacco Use  . Smoking status: Former Smoker    Types: Cigarettes  . Smokeless tobacco: Current User    Types: Chew  . Tobacco comment: Stopped approximately 10 years ago.  Vaping Use  . Vaping Use: Never used  Substance and Sexual Activity  . Alcohol use: Not Currently    Alcohol/week: 2.0 standard drinks    Types: 2 Cans of beer per week    Comment: Daily  . Drug use: Never  . Sexual activity: Yes    Birth control/protection: None  Other Topics Concern  . Not on file  Social History Narrative    lives in [place]; with son. Wife died in Aug 10, 2018.  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     Social Determinants of Health   Financial Resource Strain: Not on file  Food Insecurity: Not on file  Transportation Needs: Not on file  Physical Activity: Not on file  Stress: Not on file  Social Connections: Not on file  Intimate Partner Violence: Not on file    FAMILY HISTORY: Family History  Problem Relation Age of Onset  . Prostate cancer Neg Hx   . Kidney cancer Neg Hx   . Bladder Cancer Neg Hx     ALLERGIES:  has No Known Allergies.  MEDICATIONS:  Current  Outpatient Medications  Medication Sig Dispense Refill  . aspirin EC 81 MG tablet Take 81 mg by mouth daily.     Marland Kitchen docusate sodium (COLACE) 50 MG capsule Take 50 mg by mouth at bedtime.    . fexofenadine (ALLEGRA) 180 MG tablet Take 180 mg by mouth daily.     . fluticasone (FLONASE) 50 MCG/ACT nasal spray Place 2 sprays into both nostrils daily as needed for allergies or rhinitis. 11.1 mL 6  . levothyroxine (SYNTHROID) 88 MCG tablet Take 1 tablet (88 mcg total) by mouth daily before  breakfast. Empty stomach- 1 hour prior to breakfast. 90 tablet 3  . loratadine (CLARITIN) 10 MG tablet Take 1 tablet (10 mg total) by mouth daily. 30 tablet 11  . Multiple Vitamin (MULTIVITAMIN WITH MINERALS) TABS tablet Take 1 tablet by mouth daily.     Marland Kitchen oxybutynin (DITROPAN) 5 MG tablet TAKE 1 TABLET BY MOUTH 3 TIMES A DAY AS NEEDED 90 tablet 0  . prednisoLONE acetate (PRED FORTE) 1 % ophthalmic suspension INSTILL 1 DROP INTO EYE(S) DAILY 5 mL 6  . predniSONE (DELTASONE) 20 MG tablet Take 0.5 tablets (10 mg total) by mouth daily with breakfast. 20 tablet 0  . tamsulosin (FLOMAX) 0.4 MG CAPS capsule TAKE 1 CAPSULE BY MOUTH ONCE DAILY 90 capsule 1   No current facility-administered medications for this visit.      Marland Kitchen  PHYSICAL EXAMINATION: ECOG PERFORMANCE STATUS: 1 - Symptomatic but completely ambulatory  Vitals:   09/10/20 1314  BP: (!) 145/66  Pulse: 63  Resp: 20  Temp: 97.8 F (36.6 C)   Filed Weights   09/10/20 1314  Weight: 123 lb 12.8 oz (56.2 kg)    Physical Exam Constitutional:      Comments: Walking himself.  Thin built moderately nourished male patient.  Accompanied by son.  HENT:     Head: Normocephalic and atraumatic.     Mouth/Throat:     Pharynx: No oropharyngeal exudate.  Eyes:     Pupils: Pupils are equal, round, and reactive to light.     Comments: Chronic drooping of the left eyelid.  Cardiovascular:     Rate and Rhythm: Normal rate and regular rhythm.  Pulmonary:     Effort: No respiratory distress.     Breath sounds: No wheezing.  Abdominal:     General: Bowel sounds are normal. There is no distension.     Palpations: Abdomen is soft. There is no mass.     Tenderness: There is no abdominal tenderness. There is no guarding or rebound.  Musculoskeletal:        General: No tenderness. Normal range of motion.     Cervical back: Normal range of motion and neck supple.  Skin:    General: Skin is warm.  Neurological:     Mental Status: He is alert  and oriented to person, place, and time.  Psychiatric:        Mood and Affect: Affect normal.      LABORATORY DATA:  I have reviewed the data as listed Lab Results  Component Value Date   WBC 10.9 (H) 09/10/2020   HGB 10.2 (L) 09/10/2020   HCT 30.7 (L) 09/10/2020   MCV 101.3 (H) 09/10/2020   PLT 247 09/10/2020   Recent Labs    02/05/20 0835 02/19/20 0813 03/04/20 0801 03/18/20 0819 07/23/20 0842 08/13/20 1302 09/10/20 1255  NA 135 135 137   < > 139 138 138  K 4.3 4.7 4.7   < >  4.2 5.3* 4.8  CL 107 106 107   < > 106 105 107  CO2 21* 21* 23   < > _0 GLUCOSE 139* 101* 98   < > 72 117* 115*  BUN 49* 34* 33*   < > 31* 40* 31*  CREATININE 2.30* 2.24* 2.29*   < > 1.59* 2.08* 1.85*  CALCIUM 9.0 8.9 8.9   < > 9.0 9.3 9.0  GFRNONAA 25* 26* 25*   < > 42* 31* 35*  GFRAA 29* 30* 29*  --   --   --   --   PROT 7.1 7.0 7.0   < > 5.7* 6.4* 6.2*  ALBUMIN 4.0 3.9 4.0   < > 3.0* 3.7 3.5  AST _1 < > 48* 24 19  ALT _2 < > 36 24 16  ALKPHOS 70 70 65   < > 69 55 51  BILITOT 0.6 0.8 0.6   < > 0.7 0.7 0.6   < > = values in this interval not displayed.    RADIOGRAPHIC STUDIES: I have personally reviewed the radiological images as listed and agreed with the findings in the report. CT Chest Wo Contrast  Result Date: 08/30/2020 CLINICAL DATA:  Bladder cancer restaging EXAM: CT CHEST WITHOUT CONTRAST TECHNIQUE: Multidetector CT imaging of the chest was performed following the standard protocol without IV contrast. COMPARISON:  04/12/2020 FINDINGS: Cardiovascular: Coronary, aortic arch, and branch vessel atherosclerotic vascular disease. Heart size within normal limits. Mediastinum/Nodes: Scattered small mediastinal lymph nodes are not pathologically enlarged. Lungs/Pleura: Emphysema. Mild biapical pleuroparenchymal scarring. Peripheral fibrosis in the lung bases, right greater than left. Scattered mosaic attenuation, the pattern of patchy ground-glass opacities is different  from prior, and potentially manifestation of acute hypersensitivity, or atypical infection. Background UIP is a possibility although is not thought to be the only explanation for the current mosaic attenuation. Stable 5 mm sub solid nodule in the left lower lobe on image 132 series 3. Pleural calcifications compatible with remote asbestos exposure. Cylindrical bronchiectasis particularly in the lower lobes. No findings characteristic for metastatic transitional cell carcinoma in the lungs. Upper Abdomen: Abdominal aortic atherosclerosis. Musculoskeletal: Thoracic spondylosis. IMPRESSION: 1. No findings of metastatic transitional cell carcinoma in the lungs. 2. Scattered mosaic attenuation in the lungs, with some scattered ground-glass opacities, potentially a manifestation of acute hypersensitivity, or atypical infection. Background UIP is a possibility although is not thought to be the only explanation for the current mosaic attenuation. 3. Coronary, aortic arch, and branch vessel atherosclerotic vascular disease. 4. Pleural calcifications compatible with remote asbestos exposure. 5. Cylindrical bronchiectasis in the lower lobes. 6. Stable 5 mm sub solid nodule in the left lower lobe. 7. Emphysema and aortic atherosclerosis. Aortic Atherosclerosis (ICD10-I70.0) and Emphysema (ICD10-J43.9). Electronically Signed   By: Van Clines M.D.   On: 08/30/2020 15:36    ASSESSMENT & PLAN:   Cancer of overlapping sites of bladder ([place]) # High-grade transitional cell carcinoma of the bladder metastatic to retroperitoneal lymph node.  Stage IV- CT A/P- MARCH 2022- [facility]-no evidence of any distant metastatic disease in the abdomen pelvis; bladder wall thickening/nodularity concerning for malignancy.   # Continue to hold Opdivo given patient's overall poor tolerance to therapy.  Continue prednisone 10 mg a day for now.  # Local recurrent bladder ca < 1cm [Dr.Stoiof March 2022]- agree with TURBT  #CT chest  groundglass opacities-less likely infection; suspect hypersensitivity pneumonitis.  Recommend evaluation with pulmonary.  On prednisone as above.  We will make a referral to [facility]  # Iatrogenic hypothyroidism-on Synthroid 88 mcg; TSH JAN 2022- WNL.   # Anemia sec to CKD-III-IV/ on Iv iron.Hb-10 STABLE  # JAN 28th, 2022-incidental 2 mm enhancing focus along the anterior right frontal lobe- clinically likely vascular enhancement; less likely metastasis. Will repeat MRI in 3 months. will order at next visit  # CKD stageIII-GFR-35-  STABLE  I spoke at length with the patient's family, Ronalee Belts- regarding the patient's clinical status/plan of care.  Family agreement.    # DISPOSITION:   # HOLD  VENOFER # referral to Dr.Aleskerov/ Pulmonary re: abnormal CT chest # Follow-up in 4 weeks- MD labs-cbc/cmp/possible venofer;  Dr.B  # I reviewed the blood work- with the patient in detail; also reviewed the imaging independently [as summarized above]; and with the patient in detail.     All questions were answered. The patient knows to call the clinic with any problems, questions or concerns.    Cammie Sickle, MD 09/15/2020 7:43 AM

## 2020-09-10 NOTE — Assessment & Plan Note (Addendum)
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  Stage IV- CT A/P- MARCH 2022- [facility]-no evidence of any distant metastatic disease in the abdomen pelvis; bladder wall thickening/nodularity concerning for malignancy.   # Continue to hold Opdivo given patient's overall poor tolerance to therapy.  Continue prednisone 10 mg a day for now.  # Local recurrent bladder ca < 1cm [Dr.Stoiof March 2022]- agree with TURBT  #CT chest groundglass opacities-less likely infection; suspect hypersensitivity pneumonitis.  Recommend evaluation with pulmonary.  On prednisone as above.  We will make a referral to [facility]  # Iatrogenic hypothyroidism-on Synthroid 88 mcg; TSH JAN 2022- WNL.   # Anemia sec to CKD-III-IV/ on Iv iron.Hb-10 STABLE  # JAN 28th, 2022-incidental 2 mm enhancing focus along the anterior right frontal lobe- clinically likely vascular enhancement; less likely metastasis. Will repeat MRI in 3 months. will order at next visit  # CKD stageIII-GFR-35-  STABLE  I spoke at length with the patient's family, Ronalee Belts- regarding the patient's clinical status/plan of care.  Family agreement.    # DISPOSITION:   # HOLD  VENOFER # referral to Dr.Aleskerov/ Pulmonary re: abnormal CT chest # Follow-up in 4 weeks- MD labs-cbc/cmp/possible venofer;  Dr.B  # I reviewed the blood work- with the patient in detail; also reviewed the imaging independently [as summarized above]; and with the patient in detail.

## 2020-09-13 ENCOUNTER — Other Ambulatory Visit: Payer: Self-pay

## 2020-09-13 ENCOUNTER — Other Ambulatory Visit: Payer: Self-pay | Admitting: Urology

## 2020-09-13 ENCOUNTER — Other Ambulatory Visit: Payer: Medicare HMO

## 2020-09-13 DIAGNOSIS — Z01818 Encounter for other preprocedural examination: Secondary | ICD-10-CM | POA: Diagnosis not present

## 2020-09-13 DIAGNOSIS — R310 Gross hematuria: Secondary | ICD-10-CM | POA: Diagnosis not present

## 2020-09-13 DIAGNOSIS — N3289 Other specified disorders of bladder: Secondary | ICD-10-CM

## 2020-09-13 DIAGNOSIS — C679 Malignant neoplasm of bladder, unspecified: Secondary | ICD-10-CM

## 2020-09-13 DIAGNOSIS — D494 Neoplasm of unspecified behavior of bladder: Secondary | ICD-10-CM

## 2020-09-16 ENCOUNTER — Other Ambulatory Visit
Admission: RE | Admit: 2020-09-16 | Discharge: 2020-09-16 | Disposition: A | Discharge status: Home / Self Care | Payer: Medicare HMO | Source: Ambulatory Visit | Attending: Urology | Admitting: Urology

## 2020-09-16 ENCOUNTER — Other Ambulatory Visit: Payer: Self-pay

## 2020-09-16 DIAGNOSIS — Z20822 Contact with and (suspected) exposure to covid-19: Secondary | ICD-10-CM | POA: Insufficient documentation

## 2020-09-16 DIAGNOSIS — Z01812 Encounter for preprocedural laboratory examination: Secondary | ICD-10-CM | POA: Insufficient documentation

## 2020-09-16 NOTE — Patient Instructions (Signed)
Your procedure is scheduled on: Tuesday 09/21/20.  Report to THE FIRST FLOOR REGISTRATION DESK IN THE MEDICAL MALL ON THE MORNING OF SURGERY FIRST, THEN YOU WILL CHECK IN AT THE SURGERY INFORMATION DESK LOCATED OUTSIDE THE SAME DAY SURGERY DEPARTMENT LOCATED ON 2ND FLOOR MEDICAL MALL ENTRANCE.  To find out your arrival time please call [phone] between 1PM - 3PM on Monday 09/20/20.   Remember: Instructions that are not followed completely may result in serious medical risk, up to and including death, or upon the discretion of your surgeon and anesthesiologist your surgery may need to be rescheduled.    __X__ 1. Do not eat food or drink liquids after midnight the night before your procedure.                 No gum chewing or hard candies.   __X__2.  On the morning of surgery brush your teeth with toothpaste and water, you may rinse your mouth with mouthwash if you wish.  Do not swallow any toothpaste or mouthwash.    __X__ 3.  No Alcohol for 24 hours before or after surgery.  __X__ 4.  Do Not Smoke or use e-cigarettes For 24 Hours Prior to Your Surgery.                 Do not use any chewable tobacco products for at least 6 hours prior to                 surgery.  __X__5.  Notify your doctor if there is any change in your medical condition      (cold, fever, infections).      Do NOT wear jewelry, make-up, hairpins, clips or nail polish. Do NOT wear lotions, powders, or perfumes.  Do NOT shave 48 hours prior to surgery. Men may shave face and neck. Do NOT bring valuables to the hospital.     [facility] is not responsible for any belongings or valuables.   Contacts, dentures/partials or body piercings may not be worn into surgery. Bring a case for your contacts, glasses or hearing aids, a denture cup will be supplied. Leave your suitcase in the car. After surgery it may be brought to your room.   For patients admitted to the hospital, discharge time is determined by your  treatment team.    Patients discharged the day of surgery will not be allowed to drive home.     __X__ Take these medicines the morning of surgery with A SIP OF WATER:     1. levothyroxine (SYNTHROID)     __X__ Use CHG Soap/SAGE wipes as directed  __X__ Stop Blood Thinners: Aspirin. You reported already stopping this medication.  __X__ Stop Anti-inflammatories 7 days before surgery such as Advil, Ibuprofen, Motrin, BC or Goodies Powder, Naprosyn, Naproxen, Aleve, Aspirin, Meloxicam. May take Tylenol if needed for pain or discomfort.   __X__Do not start taking any new herbal supplements or vitamins prior to your procedure.    Wear comfortable clothing (specific to your surgery type) to the hospital.  Plan for stool softeners for home use; pain medications have a tendency to cause constipation. You can also help prevent constipation by eating foods high in fiber such as fruits and vegetables and drinking plenty of fluids as your diet allows.  After surgery, you can prevent lung complications by doing breathing exercises.Take deep breaths and cough every 1-2 hours. Your doctor may order a device called an Incentive Spirometer to help you take deep breaths.  Please call the [place] Department at [phone] if you have any questions about these instructions.

## 2020-09-17 ENCOUNTER — Other Ambulatory Visit
Admission: RE | Admit: 2020-09-17 | Discharge: 2020-09-17 | Disposition: A | Discharge status: Home / Self Care | Payer: Medicare HMO | Source: Ambulatory Visit | Attending: Urology | Admitting: Urology

## 2020-09-17 DIAGNOSIS — Z20822 Contact with and (suspected) exposure to covid-19: Secondary | ICD-10-CM | POA: Diagnosis not present

## 2020-09-17 DIAGNOSIS — Z01812 Encounter for preprocedural laboratory examination: Secondary | ICD-10-CM | POA: Diagnosis not present

## 2020-09-17 LAB — SARS CORONAVIRUS 2 (TAT 6-24 HRS): SARS Coronavirus 2: NEGATIVE

## 2020-09-20 ENCOUNTER — Other Ambulatory Visit: Payer: Self-pay | Admitting: Internal Medicine

## 2020-09-20 DIAGNOSIS — E032 Hypothyroidism due to medicaments and other exogenous substances: Secondary | ICD-10-CM

## 2020-09-20 LAB — CULTURE, URINE COMPREHENSIVE

## 2020-09-21 ENCOUNTER — Encounter
Admission: RE | Disposition: A | Discharge status: Home / Self Care | Payer: Self-pay | Source: Home / Self Care | Attending: Urology

## 2020-09-21 ENCOUNTER — Ambulatory Visit
Admission: RE | Admit: 2020-09-21 | Discharge: 2020-09-21 | Disposition: A | Discharge status: Home / Self Care | Payer: Medicare HMO | Attending: Urology | Admitting: Urology

## 2020-09-21 ENCOUNTER — Other Ambulatory Visit: Payer: Self-pay

## 2020-09-21 ENCOUNTER — Ambulatory Visit: Payer: Medicare HMO

## 2020-09-21 ENCOUNTER — Encounter: Payer: Self-pay | Admitting: Urology

## 2020-09-21 DIAGNOSIS — N329 Bladder disorder, unspecified: Secondary | ICD-10-CM | POA: Diagnosis not present

## 2020-09-21 DIAGNOSIS — Z7952 Long term (current) use of systemic steroids: Secondary | ICD-10-CM | POA: Diagnosis not present

## 2020-09-21 DIAGNOSIS — D631 Anemia in chronic kidney disease: Secondary | ICD-10-CM | POA: Diagnosis not present

## 2020-09-21 DIAGNOSIS — Z7982 Long term (current) use of aspirin: Secondary | ICD-10-CM | POA: Diagnosis not present

## 2020-09-21 DIAGNOSIS — Z87891 Personal history of nicotine dependence: Secondary | ICD-10-CM | POA: Insufficient documentation

## 2020-09-21 DIAGNOSIS — Z79890 Hormone replacement therapy (postmenopausal): Secondary | ICD-10-CM | POA: Insufficient documentation

## 2020-09-21 DIAGNOSIS — N3289 Other specified disorders of bladder: Secondary | ICD-10-CM | POA: Insufficient documentation

## 2020-09-21 DIAGNOSIS — C672 Malignant neoplasm of lateral wall of bladder: Secondary | ICD-10-CM | POA: Diagnosis not present

## 2020-09-21 DIAGNOSIS — N3090 Cystitis, unspecified without hematuria: Secondary | ICD-10-CM | POA: Insufficient documentation

## 2020-09-21 DIAGNOSIS — Z8551 Personal history of malignant neoplasm of bladder: Secondary | ICD-10-CM | POA: Insufficient documentation

## 2020-09-21 DIAGNOSIS — D494 Neoplasm of unspecified behavior of bladder: Secondary | ICD-10-CM | POA: Diagnosis not present

## 2020-09-21 DIAGNOSIS — N400 Enlarged prostate without lower urinary tract symptoms: Secondary | ICD-10-CM | POA: Diagnosis not present

## 2020-09-21 DIAGNOSIS — Z136 Encounter for screening for cardiovascular disorders: Secondary | ICD-10-CM | POA: Diagnosis not present

## 2020-09-21 DIAGNOSIS — Z79899 Other long term (current) drug therapy: Secondary | ICD-10-CM | POA: Diagnosis not present

## 2020-09-21 DIAGNOSIS — N1831 Chronic kidney disease, stage 3a: Secondary | ICD-10-CM | POA: Diagnosis not present

## 2020-09-21 DIAGNOSIS — I129 Hypertensive chronic kidney disease with stage 1 through stage 4 chronic kidney disease, or unspecified chronic kidney disease: Secondary | ICD-10-CM | POA: Diagnosis not present

## 2020-09-21 HISTORY — PX: CYSTOSCOPY WITH FULGERATION: SHX6638

## 2020-09-21 HISTORY — PX: CYSTOSCOPY WITH BIOPSY: SHX5122

## 2020-09-21 SURGERY — CYSTOSCOPY, WITH BIOPSY
Anesthesia: General

## 2020-09-21 MED ORDER — FAMOTIDINE 20 MG PO TABS: 20.0000 mg | ORAL_TABLET | Freq: Once | ORAL | Status: AC

## 2020-09-21 MED ORDER — FAMOTIDINE 20 MG PO TABS
ORAL_TABLET | ORAL | Status: AC
  Administered 2020-09-21: 20 mg via ORAL
  Filled 2020-09-21: qty 1

## 2020-09-21 MED ORDER — OXYCODONE HCL 5 MG PO TABS
5.0000 mg | ORAL_TABLET | Freq: Once | ORAL | Status: DC | PRN
Start: 2020-09-21 — End: 2020-09-21

## 2020-09-21 MED ORDER — FENTANYL CITRATE (PF) 100 MCG/2ML IJ SOLN: 25.0000 ug | INTRAMUSCULAR | Status: DC | PRN

## 2020-09-21 MED ORDER — LIDOCAINE HCL (CARDIAC) PF 100 MG/5ML IV SOSY
PREFILLED_SYRINGE | INTRAVENOUS | Status: DC | PRN
  Administered 2020-09-21: 60 mg via INTRAVENOUS

## 2020-09-21 MED ORDER — ONDANSETRON HCL 4 MG/2ML IJ SOLN: 4.0000 mg | Freq: Once | INTRAMUSCULAR | Status: DC | PRN

## 2020-09-21 MED ORDER — PHENYLEPHRINE HCL (PRESSORS) 10 MG/ML IV SOLN
INTRAVENOUS | Status: DC | PRN
  Administered 2020-09-21: 200 ug via INTRAVENOUS

## 2020-09-21 MED ORDER — SUCCINYLCHOLINE CHLORIDE 20 MG/ML IJ SOLN
INTRAMUSCULAR | Status: DC | PRN
  Administered 2020-09-21: 100 mg via INTRAVENOUS

## 2020-09-21 MED ORDER — GLYCOPYRROLATE 0.2 MG/ML IJ SOLN
INTRAMUSCULAR | Status: DC | PRN
  Administered 2020-09-21: .1 mg via INTRAVENOUS

## 2020-09-21 MED ORDER — ONDANSETRON HCL 4 MG/2ML IJ SOLN
INTRAMUSCULAR | Status: DC | PRN
  Administered 2020-09-21: 4 mg via INTRAVENOUS

## 2020-09-21 MED ORDER — ONDANSETRON HCL 4 MG/2ML IJ SOLN
INTRAMUSCULAR | Status: AC
  Filled 2020-09-21: qty 10

## 2020-09-21 MED ORDER — FENTANYL CITRATE (PF) 100 MCG/2ML IJ SOLN
INTRAMUSCULAR | Status: DC | PRN
  Administered 2020-09-21: 50 ug via INTRAVENOUS

## 2020-09-21 MED ORDER — ROCURONIUM BROMIDE 100 MG/10ML IV SOLN
INTRAVENOUS | Status: DC | PRN
  Administered 2020-09-21: 30 mg via INTRAVENOUS

## 2020-09-21 MED ORDER — DEXAMETHASONE SODIUM PHOSPHATE 10 MG/ML IJ SOLN
INTRAMUSCULAR | Status: DC | PRN
  Administered 2020-09-21: 10 mg via INTRAVENOUS

## 2020-09-21 MED ORDER — EPHEDRINE SULFATE 50 MG/ML IJ SOLN
INTRAMUSCULAR | Status: DC | PRN
  Administered 2020-09-21: 5 mg via INTRAVENOUS

## 2020-09-21 MED ORDER — CEFAZOLIN SODIUM-DEXTROSE 2-4 GM/100ML-% IV SOLN
INTRAVENOUS | Status: AC
  Filled 2020-09-21: qty 100

## 2020-09-21 MED ORDER — DEXAMETHASONE SODIUM PHOSPHATE 10 MG/ML IJ SOLN
INTRAMUSCULAR | Status: AC
  Filled 2020-09-21: qty 4

## 2020-09-21 MED ORDER — ACETAMINOPHEN 10 MG/ML IV SOLN
INTRAVENOUS | Status: DC | PRN
  Administered 2020-09-21: 1000 mg via INTRAVENOUS

## 2020-09-21 MED ORDER — LEVOFLOXACIN IN D5W 500 MG/100ML IV SOLN
500.0000 mg | Freq: Once | INTRAVENOUS | Status: AC
  Administered 2020-09-21: 500 mg via INTRAVENOUS

## 2020-09-21 MED ORDER — PROPOFOL 10 MG/ML IV BOLUS
INTRAVENOUS | Status: DC | PRN
  Administered 2020-09-21: 70 mg via INTRAVENOUS

## 2020-09-21 MED ORDER — ACETAMINOPHEN 10 MG/ML IV SOLN: 1000.0000 mg | Freq: Once | INTRAVENOUS | Status: DC | PRN

## 2020-09-21 MED ORDER — SUGAMMADEX SODIUM 200 MG/2ML IV SOLN
INTRAVENOUS | Status: DC | PRN
  Administered 2020-09-21: 200 mg via INTRAVENOUS
  Administered 2020-09-21: 100 mg via INTRAVENOUS

## 2020-09-21 MED ORDER — GLYCOPYRROLATE 0.2 MG/ML IJ SOLN
INTRAMUSCULAR | Status: AC
  Filled 2020-09-21: qty 4

## 2020-09-21 MED ORDER — LACTATED RINGERS IV SOLN: INTRAVENOUS | Status: DC

## 2020-09-21 MED ORDER — SODIUM CHLORIDE 0.9 % IV SOLN
INTRAVENOUS | Status: DC | PRN
  Administered 2020-09-21: 30 ug/min via INTRAVENOUS

## 2020-09-21 MED ORDER — OXYCODONE HCL 5 MG/5ML PO SOLN
5.0000 mg | Freq: Once | ORAL | Status: DC | PRN
Start: 2020-09-21 — End: 2020-09-21

## 2020-09-21 MED ORDER — CHLORHEXIDINE GLUCONATE 0.12 % MT SOLN
OROMUCOSAL | Status: AC
  Administered 2020-09-21: 15 mL via OROMUCOSAL
  Filled 2020-09-21: qty 15

## 2020-09-21 MED ORDER — ORAL CARE MOUTH RINSE: 15.0000 mL | Freq: Once | OROMUCOSAL | Status: AC

## 2020-09-21 MED ORDER — FENTANYL CITRATE (PF) 100 MCG/2ML IJ SOLN
INTRAMUSCULAR | Status: AC
  Filled 2020-09-21: qty 2

## 2020-09-21 MED ORDER — LEVOFLOXACIN IN D5W 500 MG/100ML IV SOLN
INTRAVENOUS | Status: AC
  Filled 2020-09-21: qty 100

## 2020-09-21 MED ORDER — CEFAZOLIN SODIUM-DEXTROSE 2-4 GM/100ML-% IV SOLN
2.0000 g | INTRAVENOUS | Status: DC
  Administered 2020-09-21: 2 g via INTRAVENOUS

## 2020-09-21 MED ORDER — CHLORHEXIDINE GLUCONATE 0.12 % MT SOLN: 15.0000 mL | Freq: Once | OROMUCOSAL | Status: AC

## 2020-09-21 SURGICAL SUPPLY — 36 items
BAG DRAIN CYSTO-URO LG1000N (MISCELLANEOUS) ×2 IMPLANT
BAG DRN RND TRDRP ANRFLXCHMBR (UROLOGICAL SUPPLIES) ×1
BAG URINE DRAIN 2000ML AR STRL (UROLOGICAL SUPPLIES) ×2 IMPLANT
BRUSH SCRUB EZ 1% IODOPHOR (MISCELLANEOUS) ×2 IMPLANT
CATH FOLEY 2WAY 18X30 (CATHETERS) IMPLANT
CATH FOLEY 2WAY SIL 18X30 (CATHETERS)
CATH URET FLEX-TIP 2 LUMEN 10F (CATHETERS) ×2 IMPLANT
COVER WAND RF STERILE (DRAPES) ×2 IMPLANT
DRAPE UTILITY 15X26 TOWEL STRL (DRAPES) ×2 IMPLANT
DRSG TELFA 4X3 1S NADH ST (GAUZE/BANDAGES/DRESSINGS) ×2 IMPLANT
ELECT LOOP 22F BIPOLAR SML (ELECTROSURGICAL) ×2
ELECT REM PT RETURN 9FT ADLT (ELECTROSURGICAL) ×2
ELECTRODE LOOP 22F BIPOLAR SML (ELECTROSURGICAL) IMPLANT
ELECTRODE REM PT RTRN 9FT ADLT (ELECTROSURGICAL) ×1 IMPLANT
GLOVE SURG UNDER POLY LF SZ7.5 (GLOVE) ×2 IMPLANT
GOWN STRL REUS W/ TWL LRG LVL3 (GOWN DISPOSABLE) ×2 IMPLANT
GOWN STRL REUS W/ TWL XL LVL3 (GOWN DISPOSABLE) ×1 IMPLANT
GOWN STRL REUS W/TWL LRG LVL3 (GOWN DISPOSABLE) ×4
GOWN STRL REUS W/TWL XL LVL3 (GOWN DISPOSABLE) ×2
GOWN STRL REUS W/TWL XL LVL4 (GOWN DISPOSABLE) ×2 IMPLANT
GUIDEWIRE STR DUAL SENSOR (WIRE) ×4 IMPLANT
IV NS IRRIG 3000ML ARTHROMATIC (IV SOLUTION) ×4 IMPLANT
KIT TURNOVER CYSTO (KITS) ×2 IMPLANT
LOOP CUT BIPOLAR 24F LRG (ELECTROSURGICAL) IMPLANT
MANIFOLD NEPTUNE II (INSTRUMENTS) ×1 IMPLANT
NDL SAFETY ECLIPSE 18X1.5 (NEEDLE) ×1 IMPLANT
NEEDLE HYPO 18GX1.5 SHARP (NEEDLE) ×2
PACK CYSTO AR (MISCELLANEOUS) ×2 IMPLANT
SET CYSTO W/LG BORE CLAMP LF (SET/KITS/TRAYS/PACK) ×2 IMPLANT
SET IRRIG Y TYPE TUR BLADDER L (SET/KITS/TRAYS/PACK) ×2 IMPLANT
SHEATH URETERAL 12FRX35CM (MISCELLANEOUS) ×2 IMPLANT
SURGILUBE 2OZ TUBE FLIPTOP (MISCELLANEOUS) ×2 IMPLANT
SYR TOOMEY IRRIG 70ML (MISCELLANEOUS) ×2
SYRINGE TOOMEY IRRIG 70ML (MISCELLANEOUS) ×1 IMPLANT
WATER STERILE IRR 1000ML POUR (IV SOLUTION) ×2 IMPLANT
WATER STERILE IRR 3000ML UROMA (IV SOLUTION) IMPLANT

## 2020-09-21 NOTE — Anesthesia Postprocedure Evaluation (Signed)
Anesthesia Post Note  Patient: Edward Cummings  Procedure(s) Performed: CYSTOSCOPY WITH BLADDER BIOPSY (N/A ) CYSTOSCOPY WITH FULGERATION (N/A )  Patient location during evaluation: PACU Anesthesia Type: General Level of consciousness: awake and alert Pain management: pain level controlled Vital Signs Assessment: post-procedure vital signs reviewed and stable Respiratory status: spontaneous breathing, nonlabored ventilation, respiratory function stable and patient connected to nasal cannula oxygen Cardiovascular status: blood pressure returned to baseline and stable Postop Assessment: no apparent nausea or vomiting Anesthetic complications: no   No complications documented.   Last Vitals:  Vitals:   09/21/20 1238 09/21/20 1250  BP: (!) 153/66 (!) 152/68  Pulse: 73 73  Resp: 17 18  Temp: 37 C (!) 36.1 C  SpO2: 99% 99%    Last Pain:  Vitals:   09/21/20 1250  TempSrc: Temporal  PainSc: 0-No pain                 Arita Miss

## 2020-09-21 NOTE — Op Note (Signed)
Preoperative diagnosis:  1. Bladder lesions  Postoperative diagnosis:  1. Same  Procedure: 1. Cystoscopy with bladder biopsies/fulguration  [name], MD  Anesthesia: General  Complications: None  Intraoperative findings:  1.  Cystoscopy-urethra normal in, without stricture; prostate with moderate lateral lobe enlargement.  Bladder mucosa with 4 areas on posterior wall with mild erythema and early papillary change all measuring <5 mm.  Right UO resected with clear efflux.  Left UO normal in appearance with clear efflux  EBL: Minimal  Specimens: Bladder biopsies  Indication: Edward Cummings is a 87 y.o. patient a history of metastatic muscle invasive bladder cancer.  Recent surveillance cystoscopy noted to have mucosal lesions with early papillary change.  After reviewing the management options for treatment, he elected to proceed with the above surgical procedure(s). We have discussed the potential benefits and risks of the procedure, side effects of the proposed treatment, the likelihood of the patient achieving the goals of the procedure, and any potential problems that might occur during the procedure or recuperation. Informed consent has been obtained.  Description of procedure:  The patient was taken to the operating room and general anesthesia was induced.  The patient was placed in the dorsal lithotomy position, prepped and draped in the usual sterile fashion, and preoperative antibiotics were administered. A preoperative time-out was performed.   A 22 French cystoscope was lubricated, passed per urethra and advanced proximally into the bladder under direct vision with findings as described above.  Cold biopsies of 2 of the lesions were taken with a rigid forceps.  The scope was removed and a 22 French continuous-flow resectoscope sheath with obturator was placed into the bladder.  The [place] percent scope with a small loop was then placed into the sheath.  The  biopsy sites were fulgurated as well as the 2 lesions which were not biopsied.  At the completion of the procedure hemostasis was noted to be adequate.  The resectoscope was removed.  After anesthetic reversal the patient was transported to the PACU in stable condition.  Plan:  He will be contacted with the pathology results and further recommendations    Abbie Sons, M.D.

## 2020-09-21 NOTE — Transfer of Care (Signed)
Immediate Anesthesia Transfer of Care Note  Patient: Edward Cummings  Procedure(s) Performed: CYSTOSCOPY WITH BLADDER BIOPSY (N/A ) CYSTOSCOPY WITH FULGERATION (N/A ) TRANSURETHRAL RESECTION OF BLADDER TUMOR (TURBT) (N/A )  Patient Location: PACU  Anesthesia Type:General  Level of Consciousness: awake, alert  and oriented  Airway & Oxygen Therapy: Patient Spontanous Breathing and Patient connected to face mask oxygen  Post-op Assessment: Report given to RN and Post -op Vital signs reviewed and stable  Post vital signs: Reviewed and stable  Last Vitals:  Vitals Value Taken Time  BP 148/104   Temp    Pulse 78   Resp 13 09/21/20 1209  SpO2 100   Vitals shown include unvalidated device data.  Last Pain:  Vitals:   09/21/20 1000  TempSrc: Temporal  PainSc: 0-No pain         Complications: No complications documented.

## 2020-09-21 NOTE — Anesthesia Procedure Notes (Signed)
Procedure Name: Intubation Performed by: Fletcher-Harrison, Aijalon Demuro, CRNA Pre-anesthesia Checklist: Patient identified, Emergency Drugs available, Suction available and Patient being monitored Patient Re-evaluated:Patient Re-evaluated prior to induction Oxygen Delivery Method: Circle system utilized Preoxygenation: Pre-oxygenation with 100% oxygen Induction Type: IV induction Ventilation: Mask ventilation without difficulty Laryngoscope Size: McGraph and 3 Tube type: Oral Tube size: 6.5 mm Number of attempts: 1 Airway Equipment and Method: Stylet and Oral airway Placement Confirmation: ETT inserted through vocal cords under direct vision,  positive ETCO2,  breath sounds checked- equal and bilateral and CO2 detector Secured at: 21 cm Tube secured with: Tape Dental Injury: Teeth and Oropharynx as per pre-operative assessment        

## 2020-09-21 NOTE — Anesthesia Preprocedure Evaluation (Signed)
Anesthesia Evaluation  Patient identified by MRN, date of birth, ID band Patient awake    Reviewed: Allergy & Precautions, NPO status , Patient's Chart, lab work & pertinent test results  History of Anesthesia Complications Negative for: history of anesthetic complications  Airway Mallampati: II  TM Distance: >3 FB Neck ROM: Full    Dental  (+) Edentulous Upper, Edentulous Lower   Pulmonary neg pulmonary ROS, neg sleep apnea, neg COPD, Patient abstained from smoking.Not current smoker, former smoker,    Pulmonary exam normal breath sounds clear to auscultation       Cardiovascular Exercise Tolerance: Good METShypertension, (-) CAD and (-) Past MI Normal cardiovascular exam(-) dysrhythmias  Rhythm:Regular Rate:Normal - Systolic murmurs    Neuro/Psych  Headaches, PSYCHIATRIC DISORDERS Anxiety Depression  Neuromuscular disease    GI/Hepatic Neg liver ROS, neg GERD  ,dysphagia   Endo/Other  negative endocrine ROSneg diabetes  Renal/GU Renal InsufficiencyRenal disease     Musculoskeletal negative musculoskeletal ROS (+)   Abdominal Normal abdominal exam  (+)   Peds negative pediatric ROS (+)  Hematology  (+) anemia ,   Anesthesia Other Findings Past Medical History: No date: Anemia No date: Cancer ([place])     Comment:  bladder No date: Chronic kidney disease No date: Depression No date: Hypertension No date: Neuromuscular disorder ([place])     Comment:  Nerve damage to left face/eye since around 2002.  Reproductive/Obstetrics                             Anesthesia Physical  Anesthesia Plan  ASA: III  Anesthesia Plan: General   Post-op Pain Management:    Induction: Intravenous  PONV Risk Score and Plan: 2 and Ondansetron, Propofol infusion and TIVA  Airway Management Planned: LMA and Oral ETT  Additional Equipment: None  Intra-op Plan:   Post-operative Plan: Extubation in  OR  Informed Consent: I have reviewed the patients History and Physical, chart, labs and discussed the procedure including the risks, benefits and alternatives for the proposed anesthesia with the patient or authorized representative who has indicated his/her understanding and acceptance.     Dental advisory given  Plan Discussed with: CRNA and Surgeon  Anesthesia Plan Comments: (Discussed risks of anesthesia with patient, including possibility of difficulty with spontaneous ventilation under anesthesia necessitating airway intervention, PONV, and rare risks such as cardiac or respiratory or neurological events. Patient understands.)        Anesthesia Quick Evaluation

## 2020-09-21 NOTE — Discharge Instructions (Signed)
AMBULATORY SURGERY  DISCHARGE INSTRUCTIONS   1) The drugs that you were given will stay in your system until tomorrow so for the next 24 hours you should not:  A) Drive an automobile B) Make any legal decisions C) Drink any alcoholic beverage   2) You may resume regular meals tomorrow.  Today it is better to start with liquids and gradually work up to solid foods.  You may eat anything you prefer, but it is better to start with liquids, then soup and crackers, and gradually work up to solid foods.   3) Please notify your doctor immediately if you have any unusual bleeding, trouble breathing, redness and pain at the surgery site, drainage, fever, or pain not relieved by medication.    4) Additional Instructions:        Please contact your physician with any problems or Same Day Surgery at [phone], Monday through Friday 6 am to 4 pm, or [facility] number at [phone].Bladder Biopsy   Definition:  Transurethral Resection of the Bladder Tumor is a surgical procedure used to diagnose and remove tumors within the bladder. T  General instructions:     Your recent bladder surgery requires very little post hospital care but some definite precautions.  Despite the fact that no skin incisions were used, the area around the bladder incisions are raw and covered with scabs to promote healing and prevent bleeding. Certain precautions are needed to insure that the scabs are not disturbed over the next 2-4 weeks while the healing proceeds.  Because the raw surface inside your bladder and the irritating effects of urine you may expect frequency of urination and/or urgency (a stronger desire to urinate) and perhaps even getting up at night more often. This will usually resolve or improve slowly over the healing period. You may see some blood in your urine over the first 6 weeks. Do not be alarmed, even if the urine was clear for a while. Get off your feet and drink  lots of fluids until clearing occurs. If you start to pass clots or don't improve call us.  Diet:  You may return to your normal diet immediately. Because of the raw surface of your bladder, alcohol, spicy foods, foods high in acid and drinks with caffeine may cause irritation or frequency and should be used in moderation. To keep your urine flowing freely and avoid constipation, drink plenty of fluids during the day (8-10 glasses). Tip: Avoid cranberry juice because it is very acidic.  Activity:  Your physical activity doesn't need to be restricted. However, if you are very active, you may see some blood in the urine. We suggest that you reduce your activity under the circumstances until the bleeding has stopped.  Bowels:  It is important to keep your bowels regular during the postoperative period. Straining with bowel movements can cause bleeding. A bowel movement every other day is reasonable. Use a mild laxative if needed, such as milk of magnesia 2-3 tablespoons, or 2 Dulcolax tablets. Call if you continue to have problems. If you had been taking narcotics for pain, before, during or after your surgery, you may be constipated. Take a laxative if necessary.    Medication:  You should resume your pre-surgery medications unless told not to. In addition you may be given an antibiotic to prevent or treat infection. Antibiotics are not always necessary. All medication should be taken as prescribed until the bottles are finished unless you are having an unusual reaction to  one of the drugs.   Juliaetta [address] [place], [place] [address] [phone]

## 2020-09-21 NOTE — Interval H&P Note (Signed)
History and Physical Interval Note:  09/21/2020 11:18 AM  [name]  has presented today for surgery, with the diagnosis of Bladder Tumors.  The various methods of treatment have been discussed with the patient and family. After consideration of risks, benefits and other options for treatment, the patient has consented to  Procedure(s): CYSTOSCOPY WITH BLADDER BIOPSY (N/A) CYSTOSCOPY WITH FULGERATION (N/A) TRANSURETHRAL RESECTION OF BLADDER TUMOR (TURBT) (N/A) as a surgical intervention.  The patient's history has been reviewed, patient examined, no change in status, stable for surgery.  I have reviewed the patient's chart and labs.  Questions were answered to the patient's satisfaction.     Edward Cummings

## 2020-09-22 ENCOUNTER — Encounter: Payer: Self-pay | Admitting: Urology

## 2020-09-22 LAB — SURGICAL PATHOLOGY

## 2020-09-27 NOTE — Telephone Encounter (Signed)
App made and patient is aware 

## 2020-09-27 NOTE — Telephone Encounter (Signed)
-----   Message from Abbie Sons, MD sent at 09/23/2020  1:13 PM EDT ----- Regarding: Follow-up Please schedule surveillance cystoscopy 6 months

## 2020-10-07 DIAGNOSIS — R030 Elevated blood-pressure reading, without diagnosis of hypertension: Secondary | ICD-10-CM | POA: Diagnosis not present

## 2020-10-07 DIAGNOSIS — G8929 Other chronic pain: Secondary | ICD-10-CM | POA: Diagnosis not present

## 2020-10-07 DIAGNOSIS — R269 Unspecified abnormalities of gait and mobility: Secondary | ICD-10-CM | POA: Diagnosis not present

## 2020-10-07 DIAGNOSIS — Z7952 Long term (current) use of systemic steroids: Secondary | ICD-10-CM | POA: Diagnosis not present

## 2020-10-07 DIAGNOSIS — R69 Illness, unspecified: Secondary | ICD-10-CM | POA: Diagnosis not present

## 2020-10-07 DIAGNOSIS — J301 Allergic rhinitis due to pollen: Secondary | ICD-10-CM | POA: Diagnosis not present

## 2020-10-07 DIAGNOSIS — N529 Male erectile dysfunction, unspecified: Secondary | ICD-10-CM | POA: Diagnosis not present

## 2020-10-07 DIAGNOSIS — Z7982 Long term (current) use of aspirin: Secondary | ICD-10-CM | POA: Diagnosis not present

## 2020-10-07 DIAGNOSIS — E039 Hypothyroidism, unspecified: Secondary | ICD-10-CM | POA: Diagnosis not present

## 2020-10-07 DIAGNOSIS — J439 Emphysema, unspecified: Secondary | ICD-10-CM | POA: Diagnosis not present

## 2020-10-11 ENCOUNTER — Inpatient Hospital Stay: Payer: Medicare HMO | Attending: Internal Medicine

## 2020-10-11 ENCOUNTER — Inpatient Hospital Stay: Payer: Medicare HMO

## 2020-10-11 ENCOUNTER — Other Ambulatory Visit: Payer: Self-pay

## 2020-10-11 ENCOUNTER — Inpatient Hospital Stay: Payer: Medicare HMO | Admitting: Internal Medicine

## 2020-10-11 ENCOUNTER — Encounter: Payer: Self-pay | Admitting: Internal Medicine

## 2020-10-11 VITALS — BP 132/47 | HR 66 | Temp 99.2°F | Resp 16 | Ht 69.0 in | Wt 123.0 lb

## 2020-10-11 DIAGNOSIS — C678 Malignant neoplasm of overlapping sites of bladder: Secondary | ICD-10-CM | POA: Diagnosis not present

## 2020-10-11 DIAGNOSIS — C772 Secondary and unspecified malignant neoplasm of intra-abdominal lymph nodes: Secondary | ICD-10-CM | POA: Insufficient documentation

## 2020-10-11 DIAGNOSIS — C7911 Secondary malignant neoplasm of bladder: Secondary | ICD-10-CM | POA: Insufficient documentation

## 2020-10-11 DIAGNOSIS — R9389 Abnormal findings on diagnostic imaging of other specified body structures: Secondary | ICD-10-CM | POA: Diagnosis not present

## 2020-10-11 DIAGNOSIS — Z7952 Long term (current) use of systemic steroids: Secondary | ICD-10-CM | POA: Diagnosis not present

## 2020-10-11 DIAGNOSIS — Z7982 Long term (current) use of aspirin: Secondary | ICD-10-CM | POA: Diagnosis not present

## 2020-10-11 DIAGNOSIS — N183 Chronic kidney disease, stage 3 unspecified: Secondary | ICD-10-CM | POA: Diagnosis not present

## 2020-10-11 DIAGNOSIS — Z87891 Personal history of nicotine dependence: Secondary | ICD-10-CM | POA: Diagnosis not present

## 2020-10-11 DIAGNOSIS — D631 Anemia in chronic kidney disease: Secondary | ICD-10-CM | POA: Diagnosis not present

## 2020-10-11 LAB — CBC WITH DIFFERENTIAL/PLATELET
Abs Immature Granulocytes: 0.18 10*3/uL — ABNORMAL HIGH (ref 0.00–0.07)
Basophils Absolute: 0.1 10*3/uL (ref 0.0–0.1)
Basophils Relative: 1 %
Eosinophils Absolute: 0.8 10*3/uL — ABNORMAL HIGH (ref 0.0–0.5)
Eosinophils Relative: 6 %
HCT: 34.9 % — ABNORMAL LOW (ref 39.0–52.0)
Hemoglobin: 11.6 g/dL — ABNORMAL LOW (ref 13.0–17.0)
Immature Granulocytes: 1 %
Lymphocytes Relative: 17 %
Lymphs Abs: 2.2 10*3/uL (ref 0.7–4.0)
MCH: 33.9 pg (ref 26.0–34.0)
MCHC: 33.2 g/dL (ref 30.0–36.0)
MCV: 102 fL — ABNORMAL HIGH (ref 80.0–100.0)
Monocytes Absolute: 1.1 10*3/uL — ABNORMAL HIGH (ref 0.1–1.0)
Monocytes Relative: 8 %
Neutro Abs: 9 10*3/uL — ABNORMAL HIGH (ref 1.7–7.7)
Neutrophils Relative %: 67 %
Platelets: 314 10*3/uL (ref 150–400)
RBC: 3.42 MIL/uL — ABNORMAL LOW (ref 4.22–5.81)
RDW: 12.8 % (ref 11.5–15.5)
WBC: 13.4 10*3/uL — ABNORMAL HIGH (ref 4.0–10.5)
nRBC: 0 % (ref 0.0–0.2)

## 2020-10-11 LAB — COMPREHENSIVE METABOLIC PANEL
ALT: 23 U/L (ref 0–44)
AST: 25 U/L (ref 15–41)
Albumin: 3.6 g/dL (ref 3.5–5.0)
Alkaline Phosphatase: 68 U/L (ref 38–126)
Anion gap: 10 (ref 5–15)
BUN: 30 mg/dL — ABNORMAL HIGH (ref 8–23)
CO2: 25 mmol/L (ref 22–32)
Calcium: 9.5 mg/dL (ref 8.9–10.3)
Chloride: 103 mmol/L (ref 98–111)
Creatinine, Ser: 1.92 mg/dL — ABNORMAL HIGH (ref 0.61–1.24)
GFR, Estimated: 34 mL/min — ABNORMAL LOW (ref >60)
Glucose, Bld: 103 mg/dL — ABNORMAL HIGH (ref 70–99)
Potassium: 5 mmol/L (ref 3.5–5.1)
Sodium: 138 mmol/L (ref 135–145)
Total Bilirubin: 0.6 mg/dL (ref 0.3–1.2)
Total Protein: 7 g/dL (ref 6.5–8.1)

## 2020-10-11 MED ORDER — PREDNISONE 20 MG PO TABS: 10.0000 mg | ORAL_TABLET | Freq: Every day | ORAL | 0 refills | Status: DC

## 2020-10-11 NOTE — Assessment & Plan Note (Addendum)
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  Stage IV- CT A/P- MARCH 2022- [facility]-no evidence of any distant metastatic disease in the abdomen pelvis; bladder wall thickening/nodularity concerning for malignancy.  Stable.   # Continue to hold Opdivo given patient's overall poor tolerance to therapy.  Continue prednisone 10 mg a day for now.  # Local recurrent bladder ca < 1cm [Dr.Stoiof March 2022]- agree with TURBT stable.  #CT chest groundglass opacities-less likely infection; suspect hypersensitivity pneumonitis.  Recommend evaluation with pulmonary.  Clinically stable.  Continue prednisone as above.  Referral to [facility]  # Iatrogenic hypothyroidism-on Synthroid 88 mcg; TSH JAN 2022- WNL.  Stable  # Anemia sec to CKD-III-IV/ on Iv iron.Hb-11-stable.  Hold Venofer today.  # JAN 28th, 2022-incidental 2 mm enhancing focus along the anterior right frontal lobe- clinically likely vascular enhancement; less likely metastasis. Will repeat MRI in brain in 1 month.   # CKD stageIII-GFR-30-stable  # DISPOSITION:   # HOLD  VENOFER # referral to Dr.Aleskerov/ Pulmonary re: abnormal CT chest # Follow-up in  51month MD labs-cbc/cmp/possible venofer;MRI Brain prior Dr.B

## 2020-10-11 NOTE — Progress Notes (Signed)
[facility] NOTE  Patient Care Team: [name], [name], [name], MD as Consulting Physician (Gastroenterology) Clyde Canterbury, MD as Referring Physician (Otolaryngology) Ottie Glazier, MD as Consulting Physician (Pulmonary Disease)  CHIEF COMPLAINTS/PURPOSE OF CONSULTATION: Bladder cancer   Oncology History Overview Note  # AUG 2019-TRANSITIONAL CELL BLADDER CA [~ 4cm tumor] s/p cystoscopy [Dr.Stoiff]  with extensive angiolymphatic invasion; [name]; [name]; mediastinal uptake; right pubic rami uptake.  # [name]; Jan 18th 2021- switched to [facility]- [pt preference; [name]; July 2020 cystoscopy-no evidence of bladder malignancy/Dr. Bernardo Heater- enlarged prostate [PSA- 0.95; 2021]  # Molecular testing- PDL-1 CPS- 20%; [name]  ;GOALS: palliative  CURRENT/MOST RECENT THERAPY:OPDIVO [C]     Cancer of overlapping sites of bladder ([place])  02/28/2018 - 06/09/2019 Chemotherapy   The patient had atezolizumab (TECENTRIQ) 1,[name], 1,200 mg, Intravenous, Once, [name],200 mg (02/28/2018), 1,200 mg (03/21/2018), 1,200 mg (04/11/2018), 1,200 mg (05/02/2018), 1,200 mg (06/13/2018), 1,200 mg (05/23/2018), 1,200 mg (07/04/2018), 1,200 mg (07/25/2018), 1,200 mg (08/15/2018), 1,200 mg (09/05/2018), 1,200 mg (10/25/2018), 1,200 mg (11/22/2018), 1,200 mg (12/20/2018), 1,200 mg (01/10/2019), 1,200 mg (01/31/2019), 1,200 mg (02/21/2019), 1,200 mg (03/14/2019), 1,200 mg (04/04/2019), 1,200 mg (04/25/2019), 1,200 mg  (05/16/2019), 1,200 mg (06/09/2019)  for chemotherapy treatment.    06/30/2019 -  Chemotherapy   The patient had nivolumab (OPDIVO) 240 mg in sodium chloride 0.9 % 100 mL chemo infusion, 240 mg, Intravenous, Once, 22 of 23 cycles Administration: 240 mg (06/30/2019), 240 mg (07/14/2019), 240 mg (07/28/2019), 240 mg (08/11/2019), 240 mg (08/25/2019), 240 mg (09/08/2019), 240 mg (09/22/2019), 240 mg (11/03/2019), 240 mg (11/17/2019), 240 mg (12/10/2019), 240 mg (12/24/2019), 240 mg (01/08/2020), 240 mg (01/22/2020), 240 mg (02/05/2020), 240 mg (02/19/2020), 240 mg (03/04/2020), 240 mg (03/18/2020), 240 mg (04/01/2020), 240 mg (04/29/2020), 240 mg (05/13/2020), 240 mg (05/27/2020), 240 mg (06/17/2020)  for chemotherapy treatment.     HISTORY OF PRESENTING ILLNESS: Edward Cummings 87 y.o.  male with metastatic transitional carcinoma of the bladder most recently on Opdivo [currently on hold because of poor tolerance] is here for follow-up.  Patient denies any unusual shortness of breath or cough.  Denies any chest pain.  Denies any abdominal pain.  No nausea no vomiting.  No blood in stools or black-colored stools.  No blood in urine.  His appetite is good.  Gaining weight.  No nausea no vomiting no difficulty swallowing.  Currently on prednisone 10 mg a day.   Review of Systems  Constitutional: Positive for malaise/fatigue. Negative for chills, diaphoresis and fever.  HENT: Negative for nosebleeds and sore throat.   Eyes: Negative for double vision.  Respiratory: Negative for hemoptysis, sputum production and wheezing.   Cardiovascular: Negative for chest pain, palpitations, orthopnea and leg swelling.  Gastrointestinal: Negative for abdominal pain, blood in stool, diarrhea, heartburn, melena, nausea and vomiting.  Genitourinary: Negative for dysuria.  Musculoskeletal: Positive for back pain and joint pain.  Skin: Negative.  Negative for itching and rash.  Neurological: Negative for tingling, focal weakness and  headaches.  Endo/Heme/Allergies:  Does not bruise/bleed easily.  Psychiatric/Behavioral: Negative for depression. The patient is not nervous/anxious and does not have insomnia.      MEDICAL HISTORY:  Past Medical History:  Diagnosis Date  . Anemia   . Anxiety 12/31/2019  . Cancer ([place])    bladder  . Chronic kidney disease   . Depression   . Hypertension   . Neuromuscular disorder ([place])    Nerve damage to left face/eye since around Aug 11, 2000.    SURGICAL HISTORY: Past Surgical History:  Procedure Laterality Date  . CYSTOSCOPY W/ RETROGRADES Bilateral 01/25/2018   Procedure: CYSTOSCOPY WITH RETROGRADE PYELOGRAM;  [name], MD;  [name];  Service: Urology;  Laterality: Bilateral;  . CYSTOSCOPY W/ URETERAL STENT PLACEMENT Bilateral 01/06/2019   Procedure: CYSTOSCOPY WITH RETROGRADE PYELOGRAM/URETERAL STENT REMOVAL;  [name], MD;  [name];  Service: Urology;  Laterality: Bilateral;  . CYSTOSCOPY WITH BIOPSY N/A 09/21/2020   Procedure: CYSTOSCOPY WITH BLADDER BIOPSY;  [name], MD;  [name];  Service: Urology;  [name];  . CYSTOSCOPY WITH FULGERATION N/A 09/21/2020   Procedure: CYSTOSCOPY WITH FULGERATION;  [name], MD;  [name];  Service: Urology;  [name];  . CYSTOSCOPY WITH STENT PLACEMENT Bilateral 01/25/2018   Procedure: CYSTOSCOPY WITH STENT PLACEMENT;  [name], MD;  [name];  Service: Urology;  Laterality: Bilateral;  . DORSAL SLIT N/A 01/06/2019   Procedure: DORSAL SLIT;  [name], MD;  [name];  Service: Urology;  [name];  . ESOPHAGOGASTRODUODENOSCOPY (EGD) WITH PROPOFOL N/A 11/12/2019   Procedure: ESOPHAGOGASTRODUODENOSCOPY (EGD) WITH PROPOFOL;  Surgeon: Virgel Manifold, MD;  Location: [facility];  Service: Endoscopy;  [name];  . EYE SURGERY     Cornea transplants bilaterally &  cataract surgery.  . TRANSURETHRAL RESECTION OF BLADDER TUMOR N/A 01/25/2018   Procedure: TRANSURETHRAL RESECTION OF BLADDER TUMOR (TURBT);  [name], MD;  [name];  Service: Urology;  [name];    SOCIAL HISTORY: Social History   Socioeconomic History  . Marital status: Married    Spouse name: [name]  . Number of children: Not on file  . Years of education: Not on file  . Highest education level: Not on file  Occupational History  . Not on file  Tobacco Use  . Smoking status: Former Smoker    Types: Cigarettes  . Smokeless tobacco: Current User    Types: Chew  . Tobacco comment: Stopped approximately 10 years ago.  Vaping Use  . Vaping Use: Never used  Substance and Sexual Activity  . Alcohol use: Not on file    Comment: occasionally  . Drug use: Never  . Sexual activity: Yes    Birth control/protection: None  Other Topics Concern  . Not on file  Social History Narrative    lives in [place]; with son. Wife died in 08/11/18.  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     Social Determinants of Health   Financial Resource Strain: Not on file  Food Insecurity: Not on file  Transportation Needs: Not on file  Physical Activity: Not on file  Stress: Not on file  Social Connections: Not on file  Intimate Partner Violence: Not on file    FAMILY HISTORY: Family History  Problem Relation Age of Onset  . Prostate cancer Neg Hx   . Kidney cancer Neg Hx   .  Bladder Cancer Neg Hx     ALLERGIES:  has No Known Allergies.  MEDICATIONS:  Current Outpatient Medications  Medication Sig Dispense Refill  . aspirin EC 81 MG tablet Take 81 mg by mouth daily.    Marland Kitchen levothyroxine (SYNTHROID) 88 MCG tablet TAKE 1 TABLET BY MOUTH ONCE A DAY. TAKE ON AN EMPTY STOMACH WITH A GLASS OF WATER ATLEAST 30-60 MIN BEFORE BREAKFAST 90 tablet 3  . Multiple Vitamin (MULTIVITAMIN WITH MINERALS) TABS tablet Take 1 tablet by mouth daily.      . predniSONE (DELTASONE) 20 MG tablet Take 0.5 tablets (10 mg total) by mouth daily with breakfast. 20 tablet 0   No current facility-administered medications for this visit.      Marland Kitchen  PHYSICAL EXAMINATION: ECOG PERFORMANCE STATUS: 1 - Symptomatic but completely ambulatory  Vitals:   10/11/20 1307  BP: (!) 132/47  Pulse: 66  Resp: 16  Temp: 99.2 F (37.3 C)  SpO2: 99%   Filed Weights   10/11/20 1307  Weight: 123 lb (55.8 kg)    Physical Exam Constitutional:      Comments: Walking himself.  Thin built moderately nourished male patient.  Accompanied by son.  HENT:     Head: Normocephalic and atraumatic.     Mouth/Throat:     Pharynx: No oropharyngeal exudate.  Eyes:     Pupils: Pupils are equal, round, and reactive to light.     Comments: Chronic drooping of the left eyelid.  Cardiovascular:     Rate and Rhythm: Normal rate and regular rhythm.  Pulmonary:     Effort: No respiratory distress.     Breath sounds: No wheezing.  Abdominal:     General: Bowel sounds are normal. There is no distension.     Palpations: Abdomen is soft. There is no mass.     Tenderness: There is no abdominal tenderness. There is no guarding or rebound.  Musculoskeletal:        General: No tenderness. Normal range of motion.     Cervical back: Normal range of motion and neck supple.  Skin:    General: Skin is warm.  Neurological:     Mental Status: He is alert and oriented to person, place, and time.  Psychiatric:        Mood and Affect: Affect normal.      LABORATORY DATA:  I have reviewed the data as listed Lab Results  Component Value Date   WBC 13.4 (H) 10/11/2020   HGB 11.6 (L) 10/11/2020   HCT 34.9 (L) 10/11/2020   MCV 102.0 (H) 10/11/2020   PLT 314 10/11/2020   Recent Labs    02/05/20 0835 02/19/20 0813 03/04/20 0801 03/18/20 0819 08/13/20 1302 09/10/20 1255 10/11/20 1238  NA 135 135 137   < > 138 138 138  K 4.3 4.7 4.7   < > 5.3* 4.8 5.0  CL 107 106 107   <  > 105 107 103  CO2 21* 21* 23   < > '24 24 25  ' GLUCOSE 139* 101* 98   < > 117* 115* 103*  BUN 49* 34* 33*   < > 40* 31* 30*  CREATININE 2.30* 2.24* 2.29*   < > 2.08* 1.85* 1.92*  CALCIUM 9.0 8.9 8.9   < > 9.3 9.0 9.5  GFRNONAA 25* 26* 25*   < > 31* 35* 34*  GFRAA 29* 30* 29*  --   --   --   --   PROT 7.1 7.0 7.0   < >  6.4* 6.2* 7.0  ALBUMIN 4.0 3.9 4.0   < > 3.7 3.5 3.6  AST '20 17 18   ' < > '24 19 25  ' ALT '13 12 13   ' < > '24 16 23  ' ALKPHOS 70 70 65   < > 55 51 68  BILITOT 0.6 0.8 0.6   < > 0.7 0.6 0.6   < > = values in this interval not displayed.    RADIOGRAPHIC STUDIES: I have personally reviewed the radiological images as listed and agreed with the findings in the report. No results found.  ASSESSMENT & PLAN:   Cancer of overlapping sites of bladder ([place]) # High-grade transitional cell carcinoma of the bladder metastatic to retroperitoneal lymph node.  Stage IV- CT A/P- MARCH 2022- [facility]-no evidence of any distant metastatic disease in the abdomen pelvis; bladder wall thickening/nodularity concerning for malignancy.  Stable.   # Continue to hold Opdivo given patient's overall poor tolerance to therapy.  Continue prednisone 10 mg a day for now.  # Local recurrent bladder ca < 1cm [Dr.Stoiof March 2022]- agree with TURBT stable.  #CT chest groundglass opacities-less likely infection; suspect hypersensitivity pneumonitis.  Recommend evaluation with pulmonary.  Clinically stable.  Continue prednisone as above.  Referral to [facility]  # Iatrogenic hypothyroidism-on Synthroid 88 mcg; TSH JAN 2022- WNL.  Stable  # Anemia sec to CKD-III-IV/ on Iv iron.Hb-11-stable.  Hold Venofer today.  # JAN 28th, 2022-incidental 2 mm enhancing focus along the anterior right frontal lobe- clinically likely vascular enhancement; less likely metastasis. Will repeat MRI in brain in 1 month.   # CKD stageIII-GFR-30-stable  # DISPOSITION:   # HOLD  VENOFER # referral to Dr.Aleskerov/ Pulmonary re:  abnormal CT chest # Follow-up in  64monthMD labs-cbc/cmp/possible venofer;MRI Brain prior Dr.B    All questions were answered. The patient knows to call the clinic with any problems, questions or concerns.    GCammie Sickle MD 10/11/2020 1:31 PM

## 2020-10-11 NOTE — Addendum Note (Signed)
Addended by: Delice Bison E on: 10/11/2020 01:42 PM   Modules accepted: Orders

## 2020-10-14 ENCOUNTER — Other Ambulatory Visit: Payer: Self-pay

## 2020-10-14 ENCOUNTER — Encounter: Payer: Self-pay | Admitting: Family Medicine

## 2020-10-14 ENCOUNTER — Ambulatory Visit (INDEPENDENT_AMBULATORY_CARE_PROVIDER_SITE_OTHER): Payer: Medicare HMO | Admitting: Family Medicine

## 2020-10-14 VITALS — BP 133/77 | HR 76 | Temp 96.4°F | Ht 68.0 in | Wt 120.4 lb

## 2020-10-14 DIAGNOSIS — I10 Essential (primary) hypertension: Secondary | ICD-10-CM | POA: Diagnosis not present

## 2020-10-14 DIAGNOSIS — J3489 Other specified disorders of nose and nasal sinuses: Secondary | ICD-10-CM | POA: Diagnosis not present

## 2020-10-14 NOTE — Assessment & Plan Note (Signed)
Patient with 2 week hx of runny nose, no sob, cough or sneezing, no facial congestion.   Plan- Zyrtec 10 mg daily for Allergic Rhinitis.

## 2020-10-14 NOTE — Assessment & Plan Note (Signed)
Patient's blood pressure is within the desired range. Medication side effects include: no side effects noted On no meds for BP and needs none at this time.   

## 2020-10-14 NOTE — Progress Notes (Signed)
Established Patient Office Visit  SUBJECTIVE:  Subjective  Patient ID: Edward Cummings, male    DOB: 01-27-1933  Age: 87 y.o. MRN: [id number]  CC:  Chief Complaint  Patient presents with  . Nasal Congestion    Patient complains of runny nose and occasional headache x 2 weeks. Patient took covid test last night. Negative result.    HPI Edward Cummings is a 87 y.o. male presenting today for     Past Medical History:  Diagnosis Date  . Anemia   . Anxiety 12/31/2019  . Cancer ([place])    bladder  . Chronic kidney disease   . Depression   . Hypertension   . Neuromuscular disorder ([place])    Nerve damage to left face/eye since around Sep 28, 2000.    Past Surgical History:  Procedure Laterality Date  . CYSTOSCOPY W/ RETROGRADES Bilateral 01/25/2018   Procedure: CYSTOSCOPY WITH RETROGRADE PYELOGRAM;  [name], MD;  [name];  Service: Urology;  Laterality: Bilateral;  . CYSTOSCOPY W/ URETERAL STENT PLACEMENT Bilateral 01/06/2019   Procedure: CYSTOSCOPY WITH RETROGRADE PYELOGRAM/URETERAL STENT REMOVAL;  [name], MD;  [name];  Service: Urology;  Laterality: Bilateral;  . CYSTOSCOPY WITH BIOPSY N/A 09/21/2020   Procedure: CYSTOSCOPY WITH BLADDER BIOPSY;  [name], MD;  [name];  Service: Urology;  [name];  . CYSTOSCOPY WITH FULGERATION N/A 09/21/2020   Procedure: CYSTOSCOPY WITH FULGERATION;  [name], MD;  [name];  Service: Urology;  [name];  . CYSTOSCOPY WITH STENT PLACEMENT Bilateral 01/25/2018   Procedure: CYSTOSCOPY WITH STENT PLACEMENT;  [name], MD;  [name];  Service: Urology;  Laterality: Bilateral;  . DORSAL SLIT N/A 01/06/2019   Procedure: DORSAL SLIT;  [name], MD;  [name];  Service: Urology;  [name];  . ESOPHAGOGASTRODUODENOSCOPY (EGD) WITH PROPOFOL N/A 11/12/2019   Procedure:  ESOPHAGOGASTRODUODENOSCOPY (EGD) WITH PROPOFOL;  Surgeon: Virgel Manifold, MD;  Location: [facility];  Service: Endoscopy;  [name];  . EYE SURGERY     Cornea transplants bilaterally & cataract surgery.  . TRANSURETHRAL RESECTION OF BLADDER TUMOR N/A 01/25/2018   Procedure: TRANSURETHRAL RESECTION OF BLADDER TUMOR (TURBT);  [name], MD;  [name];  Service: Urology;  [name];    Family History  Problem Relation Age of Onset  . Prostate cancer Neg Hx   . Kidney cancer Neg Hx   . Bladder Cancer Neg Hx     Social History   Socioeconomic History  . Marital status: Married    Spouse name: [name]  . Number of children: Not on file  . Years of education: Not on file  . Highest education level: Not on file  Occupational History  . Not on file  Tobacco Use  . Smoking status: Former Smoker    Types: Cigarettes  . Smokeless tobacco: Current User    Types: Chew  . Tobacco comment: Stopped approximately 10 years ago.  Vaping Use  . Vaping Use: Never used  Substance and Sexual Activity  . Alcohol use: Not on file    Comment: occasionally  . Drug use: Never  . Sexual activity: Yes    Birth control/protection: None  Other Topics Concern  . Not on file  Social History Narrative    lives in [place]; with son. Wife died in 09/29/18.  quit smoking 18 years ago; beer every 2 months or  so.mechanic/retd.     Social Determinants of Health   Financial Resource Strain: Not on file  Food Insecurity: Not on file  Transportation Needs: Not on file  Physical Activity: Not on file  Stress: Not on file  Social Connections: Not on file  Intimate Partner Violence: Not on file     Current Outpatient Medications:  .  aspirin EC 81 MG tablet, Take 81 mg by mouth daily., Disp: , Rfl:  .  levothyroxine (SYNTHROID) 88 MCG tablet, TAKE 1 TABLET BY MOUTH ONCE A DAY. TAKE ON AN EMPTY STOMACH WITH A GLASS OF WATER ATLEAST 30-60 MIN BEFORE BREAKFAST,  Disp: 90 tablet, Rfl: 3 .  Multiple Vitamin (MULTIVITAMIN WITH MINERALS) TABS tablet, Take 1 tablet by mouth daily. , Disp: , Rfl:  .  predniSONE (DELTASONE) 20 MG tablet, Take 0.5 tablets (10 mg total) by mouth daily with breakfast., Disp: 20 tablet, Rfl: 0   No Known Allergies  ROS Review of Systems  Constitutional: Negative.   HENT: Negative.   Respiratory: Negative.   Gastrointestinal: Negative.   Musculoskeletal: Negative.   Neurological: Negative.   Psychiatric/Behavioral: Negative.      OBJECTIVE:    Physical Exam Vitals reviewed.  HENT:     Head: Normocephalic.     Right Ear: Tympanic membrane normal.     Left Ear: Tympanic membrane normal.  Cardiovascular:     Rate and Rhythm: Normal rate and regular rhythm.  Skin:    General: Skin is warm.  Neurological:     Mental Status: He is alert.     BP 133/77   Pulse 76   Temp (!) 96.4 F (35.8 C) (Oral)   Ht 5\' 8"  (1.727 m)   Wt 120 lb 6.4 oz (54.6 kg)   SpO2 99%   BMI 18.31 kg/m  Wt Readings from Last 3 Encounters:  10/14/20 120 lb 6.4 oz (54.6 kg)  10/11/20 123 lb (55.8 kg)  09/10/20 123 lb 12.8 oz (56.2 kg)    Health Maintenance Due  Topic Date Due  . COVID-19 Vaccine (1) Never done  . TETANUS/TDAP  Never done  . PNA vac Low Risk Adult (2 of 2 - PCV13) 04/09/2019    There are no preventive care reminders to display for this patient.  CBC Latest Ref Rng & Units 10/11/2020 09/10/2020 08/13/2020  WBC 4.0 - 10.5 K/uL 13.4(H) 10.9(H) 10.5  Hemoglobin 13.0 - 17.0 g/dL 11.6(L) 10.2(L) 10.0(L)  Hematocrit 39.0 - 52.0 % 34.9(L) 30.7(L) 30.5(L)  Platelets 150 - 400 K/uL 314 247 214   CMP Latest Ref Rng & Units 10/11/2020 09/10/2020 08/13/2020  Glucose 70 - 99 mg/dL 103(H) 115(H) 117(H)  BUN 8 - 23 mg/dL 30(H) 31(H) 40(H)  Creatinine 0.61 - 1.24 mg/dL 1.92(H) 1.85(H) 2.08(H)  Sodium 135 - 145 mmol/L 138 138 138  Potassium 3.5 - 5.1 mmol/L 5.0 4.8 5.3(H)  Chloride 98 - 111 mmol/L 103 107 105  CO2 22 - 32 mmol/L 25  24 24   Calcium 8.9 - 10.3 mg/dL 9.5 9.0 9.3  Total Protein 6.5 - 8.1 g/dL 7.0 6.2(L) 6.4(L)  Total Bilirubin 0.3 - 1.2 mg/dL 0.6 0.6 0.7  Alkaline Phos 38 - 126 U/L 68 51 55  AST 15 - 41 U/L 25 19 24   ALT 0 - 44 U/L 23 16 24     Lab Results  Component Value Date   TSH 1.695 07/01/2020   Lab Results  Component Value Date   ALBUMIN 3.6 10/11/2020   ANIONGAP 10 10/11/2020  No results found for: CHOL, HDL, LDLCALC, CHOLHDL No results found for: TRIG Lab Results  Component Value Date   HGBA1C 4.5 (L) 04/13/2018      ASSESSMENT & PLAN:   Problem List Items Addressed This Visit      Cardiovascular and Mediastinum   Hypertension    Patient's blood pressure is within the desired range. Medication side effects include: no side effects noted On no meds for BP and needs none at this time.         Other   Rhinorrhea - Primary    Patient with 2 week hx of runny nose, no sob, cough or sneezing, no facial congestion.   Plan- Zyrtec 10 mg daily for Allergic Rhinitis.          No orders of the defined types were placed in this encounter.     Follow-up: No follow-ups on file.    Beckie Salts, [place] [address], [place], [place] [address]

## 2020-11-11 ENCOUNTER — Other Ambulatory Visit: Payer: Self-pay

## 2020-11-11 ENCOUNTER — Ambulatory Visit
Admission: RE | Admit: 2020-11-11 | Discharge: 2020-11-11 | Disposition: A | Discharge status: Home / Self Care | Payer: Medicare HMO | Source: Ambulatory Visit | Attending: Internal Medicine | Admitting: Internal Medicine

## 2020-11-11 DIAGNOSIS — C678 Malignant neoplasm of overlapping sites of bladder: Secondary | ICD-10-CM | POA: Insufficient documentation

## 2020-11-11 DIAGNOSIS — G319 Degenerative disease of nervous system, unspecified: Secondary | ICD-10-CM | POA: Diagnosis not present

## 2020-11-11 IMAGING — MR MR HEAD WO/W CM
14 series · 48 of 48 positions shown · IV contrast (gadavist)
Comparison: [DATE]

CLINICAL DATA: Metastatic bladder cancer.

EXAM:
MRI HEAD WITHOUT AND WITH CONTRAST
TECHNIQUE: Multiplanar, multiecho pulse sequences of the brain and surrounding
structures were obtained without and with intravenous contrast.
CONTRAST:  5mL GADAVIST GADOBUTROL 1 MMOL/ML IV SOLN

[Series 5: ax dwi_tracew · axial · 3.0mm · 0.65mm/px · z∈[-53,+101]mm · 4 of 48 slices shown]
[im 1/48]
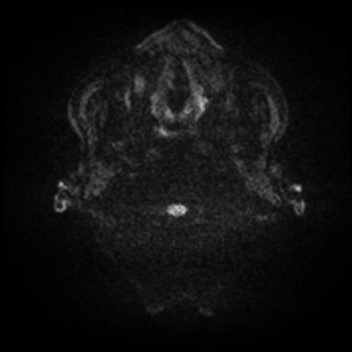
[im 16/48]
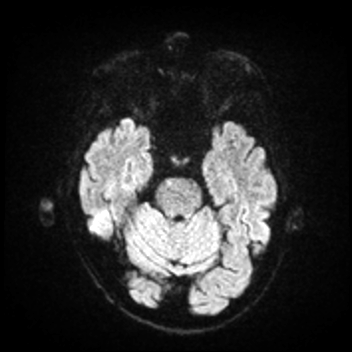
[im 32/48]
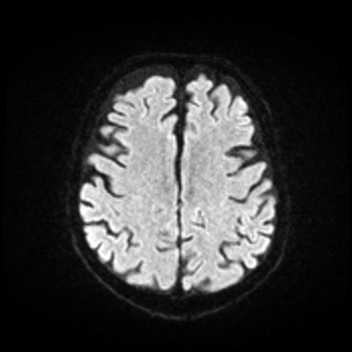
[im 48/48]
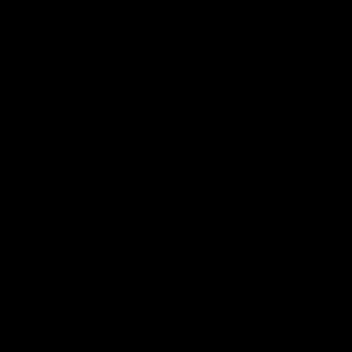

[Series 6: ax dwi_adc · axial · 3.0mm · 0.65mm/px · z∈[-53,+88]mm · 3 of 44 slices shown]
[im 1/44]
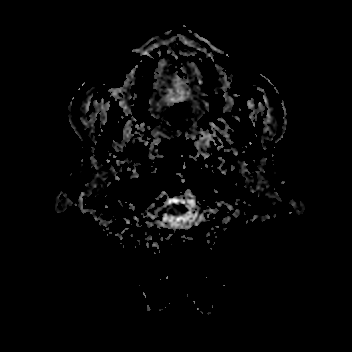
[im 22/44]
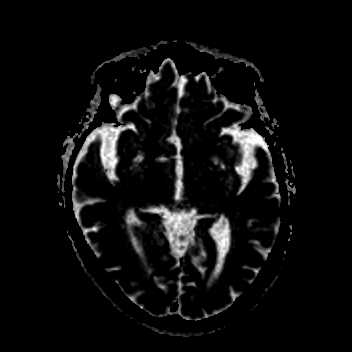
[im 44/44]
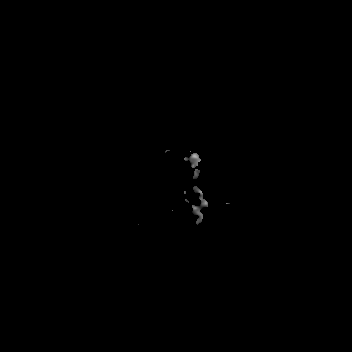

[Series 7: cor dwi_tracew · coronal · 5.0mm · 0.65mm/px · 2 of 40 slices shown]
[im 1/40]
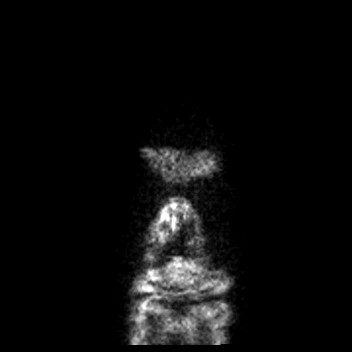
[im 40/40]
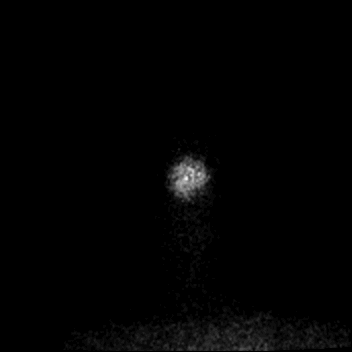

[Series 8: cor dwi_adc · coronal · 5.0mm · 0.65mm/px · 2 of 40 slices shown]
[im 1/40]
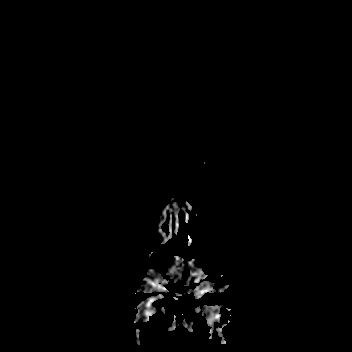
[im 40/40]
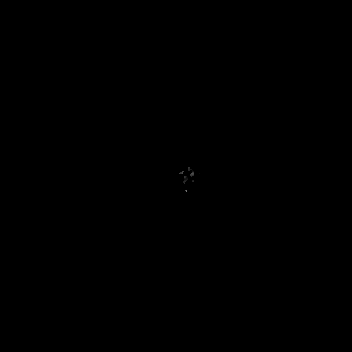

[Series 9: T1 · sagittal · 5.0mm · 0.62mm/px · 1 of 21 slices shown (1 of 2)]
[im 1/21]
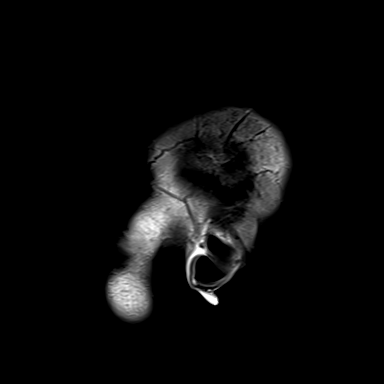

[Series 10: T2 · axial · 5.0mm · 0.53mm/px · 1 of 25 slices shown]
[im 1/25]
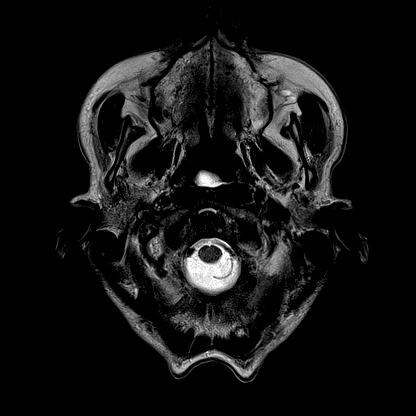

[Series 12: pha_images · axial · 3.0mm · 0.90mm/px · z∈[-64,+96]mm · 3 of 55 slices shown]
[im 1/55]
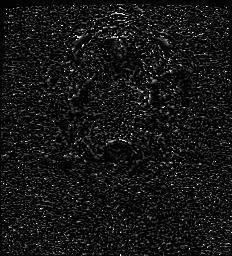
[im 28/55]
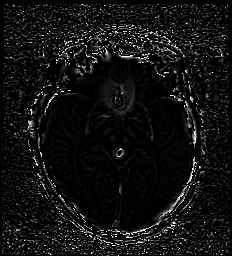
[im 55/55]
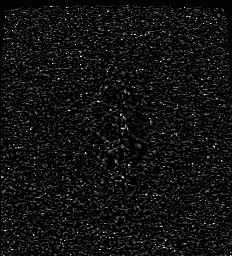

[Series 13: swi_images · axial · 3.0mm · 0.90mm/px · z∈[-64,+111]mm · 4 of 60 slices shown]
[im 1/60]
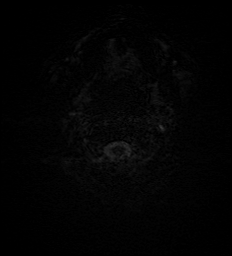
[im 20/60]
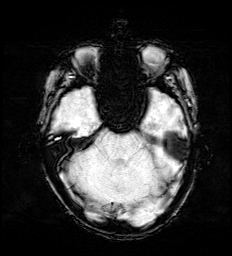
[im 40/60]
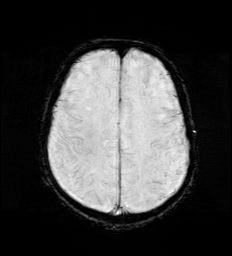
[im 60/60]
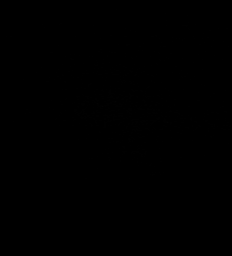

[Series 15: FLAIR · axial · 3.0mm · 0.53mm/px · z∈[-56,+105]mm · 3 of 55 slices shown]
[im 1/55]
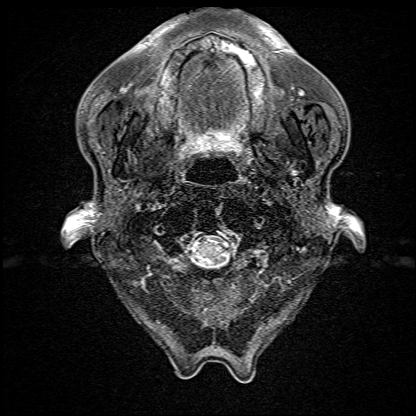
[im 28/55]
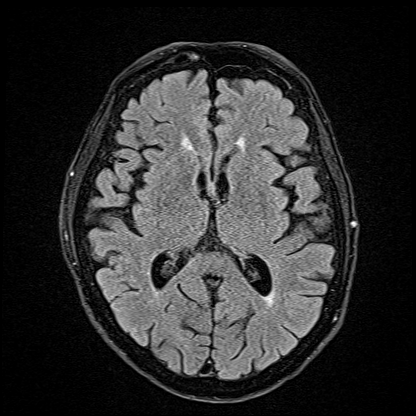
[im 55/55]
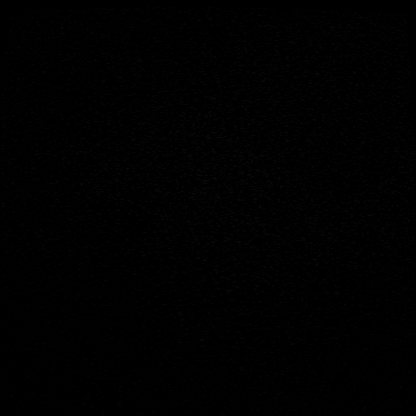

[Series 16: T1 · axial · 1.0mm · 0.98mm/px · z∈[-64,+110]mm · 10 of 174 slices shown (2 of 2)]
[im 1/174]
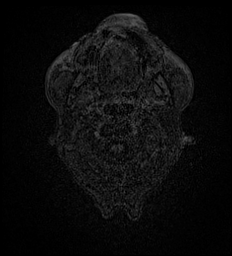
[im 20/174]
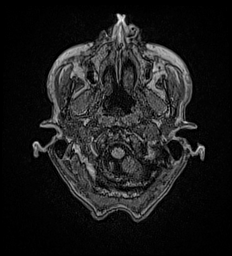
[im 39/174]
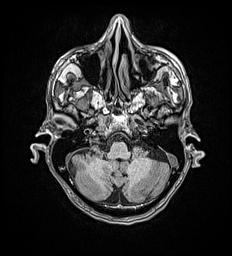
[im 58/174]
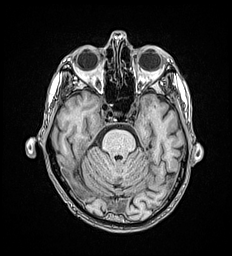
[im 77/174]
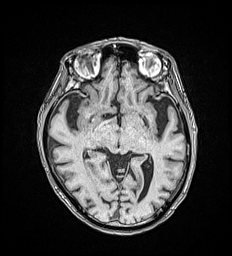
[im 97/174]
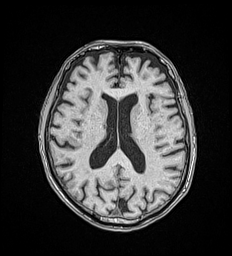
[im 116/174]
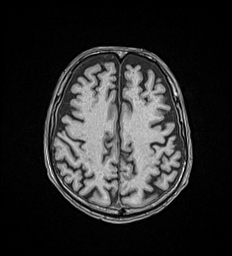
[im 135/174]
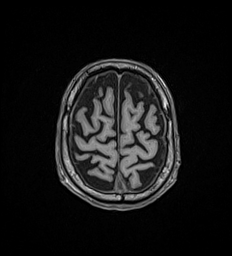
[im 154/174]
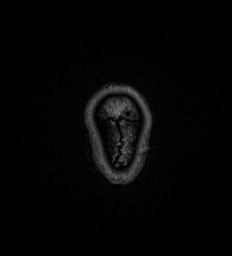
[im 174/174]
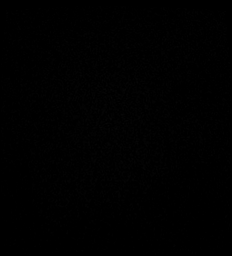

[Series 17: T2 post-contrast · coronal · 5.0mm · 0.57mm/px · 2 of 29 slices shown]
[im 1/29]
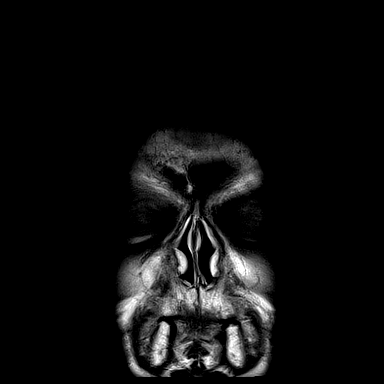
[im 29/29]
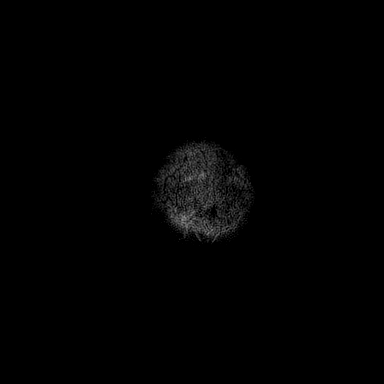

[Series 18: T1 post-contrast · axial · 1.0mm · 0.98mm/px · z∈[-64,+109]mm · 10 of 174 slices shown (1 of 3)]
[im 1/174]
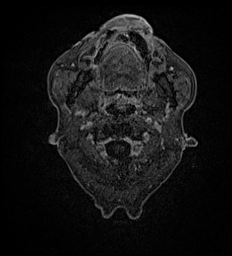
[im 20/174]
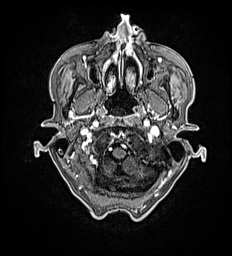
[im 39/174]
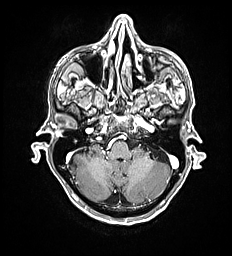
[im 58/174]
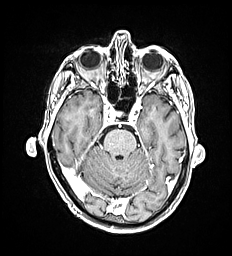
[im 77/174]
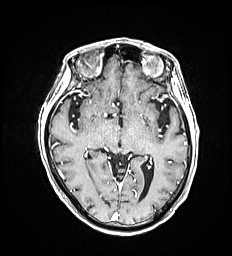
[im 97/174]
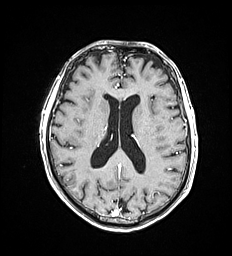
[im 116/174]
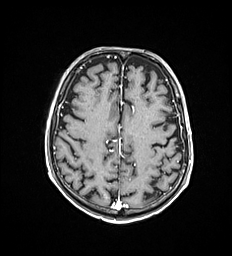
[im 135/174]
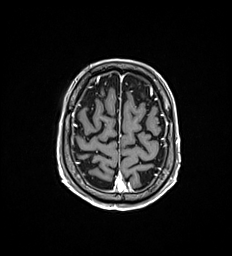
[im 154/174]
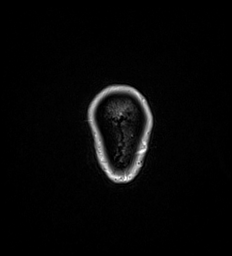
[im 174/174]
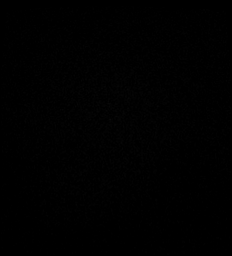

[Series 19: T1 post-contrast · coronal · 5.0mm · 0.57mm/px · 2 of 29 slices shown (2 of 3)]
[im 1/29]
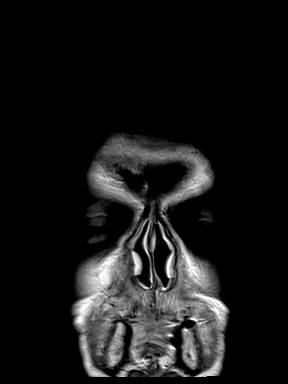
[im 29/29]
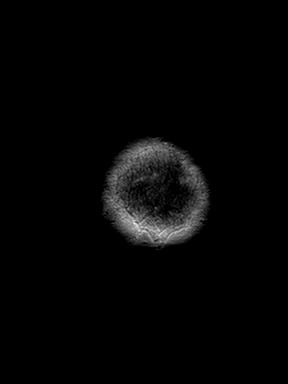

[Series 20: T1 post-contrast · sagittal · 5.0mm · 0.62mm/px · 1 of 21 slices shown (3 of 3)]
[im 1/21]
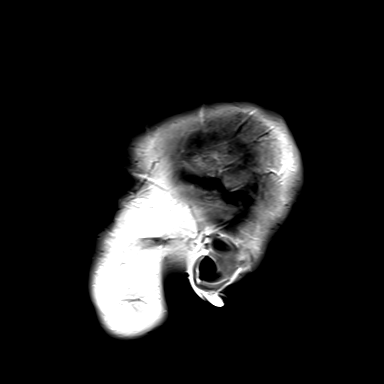

[48 of 48 positions shown; findings below may reference images not displayed]

FINDINGS: Brain: There is no evidence of an acute infarct, intracranial
hemorrhage, mass, midline shift, or extra-axial fluid collection.
Scattered small T2 hyperintensities in the cerebral white matter are
unchanged and within normal limits for age. There is mild
generalized cerebral atrophy. A 2 mm focus of enhancement along the
anterior surface of the right frontal lobe is unchanged and
attributed to vascular enhancement. No suspicious intracranial
enhancement is identified.

Vascular: Major intracranial vascular flow voids are preserved.

Skull and upper cervical spine: Unremarkable bone marrow signal.

Sinuses/Orbits: Bilateral cataract extraction. Clear paranasal
sinuses. Trace right mastoid fluid.

Other: Persistent benign-appearing cystic focus in the posterior
nasopharynx measuring 1.8 cm. Small polyp or mucous in the posterior
right nasal cavity extending into the nasopharynx.
IMPRESSION: 1. No evidence of intracranial metastases.
2. Unchanged 2 mm enhancing focus along the anterior right frontal
lobe consistent with benign vascular enhancement.

## 2020-11-11 MED ORDER — GADOBUTROL 1 MMOL/ML IV SOLN
5.0000 mL | Freq: Once | INTRAVENOUS | Status: AC | PRN
  Administered 2020-11-11: 5 mL via INTRAVENOUS

## 2020-11-16 ENCOUNTER — Other Ambulatory Visit: Payer: Self-pay

## 2020-11-16 ENCOUNTER — Telehealth: Payer: Self-pay | Admitting: *Deleted

## 2020-11-16 ENCOUNTER — Encounter: Payer: Self-pay | Admitting: Internal Medicine

## 2020-11-16 ENCOUNTER — Inpatient Hospital Stay: Payer: Medicare HMO | Attending: Internal Medicine

## 2020-11-16 ENCOUNTER — Inpatient Hospital Stay: Payer: Medicare HMO

## 2020-11-16 ENCOUNTER — Inpatient Hospital Stay (HOSPITAL_BASED_OUTPATIENT_CLINIC_OR_DEPARTMENT_OTHER): Payer: Medicare HMO | Admitting: Internal Medicine

## 2020-11-16 VITALS — BP 152/70 | HR 54 | Temp 96.6°F | Resp 16 | Wt 125.4 lb

## 2020-11-16 DIAGNOSIS — C678 Malignant neoplasm of overlapping sites of bladder: Secondary | ICD-10-CM | POA: Insufficient documentation

## 2020-11-16 DIAGNOSIS — C7911 Secondary malignant neoplasm of bladder: Secondary | ICD-10-CM | POA: Insufficient documentation

## 2020-11-16 DIAGNOSIS — Z87891 Personal history of nicotine dependence: Secondary | ICD-10-CM | POA: Insufficient documentation

## 2020-11-16 DIAGNOSIS — Z7982 Long term (current) use of aspirin: Secondary | ICD-10-CM | POA: Insufficient documentation

## 2020-11-16 DIAGNOSIS — Z7952 Long term (current) use of systemic steroids: Secondary | ICD-10-CM | POA: Insufficient documentation

## 2020-11-16 DIAGNOSIS — C772 Secondary and unspecified malignant neoplasm of intra-abdominal lymph nodes: Secondary | ICD-10-CM | POA: Diagnosis not present

## 2020-11-16 DIAGNOSIS — N183 Chronic kidney disease, stage 3 unspecified: Secondary | ICD-10-CM | POA: Diagnosis not present

## 2020-11-16 DIAGNOSIS — Z79899 Other long term (current) drug therapy: Secondary | ICD-10-CM | POA: Diagnosis not present

## 2020-11-16 DIAGNOSIS — E039 Hypothyroidism, unspecified: Secondary | ICD-10-CM | POA: Diagnosis not present

## 2020-11-16 DIAGNOSIS — R9389 Abnormal findings on diagnostic imaging of other specified body structures: Secondary | ICD-10-CM

## 2020-11-16 LAB — COMPREHENSIVE METABOLIC PANEL
ALT: 18 U/L (ref 0–44)
AST: 21 U/L (ref 15–41)
Albumin: 3.7 g/dL (ref 3.5–5.0)
Alkaline Phosphatase: 61 U/L (ref 38–126)
Anion gap: 9 (ref 5–15)
BUN: 35 mg/dL — ABNORMAL HIGH (ref 8–23)
CO2: 23 mmol/L (ref 22–32)
Calcium: 9 mg/dL (ref 8.9–10.3)
Chloride: 105 mmol/L (ref 98–111)
Creatinine, Ser: 2.11 mg/dL — ABNORMAL HIGH (ref 0.61–1.24)
GFR, Estimated: 30 mL/min — ABNORMAL LOW (ref >60)
Glucose, Bld: 73 mg/dL (ref 70–99)
Potassium: 3.9 mmol/L (ref 3.5–5.1)
Sodium: 137 mmol/L (ref 135–145)
Total Bilirubin: 0.5 mg/dL (ref 0.3–1.2)
Total Protein: 6.6 g/dL (ref 6.5–8.1)

## 2020-11-16 LAB — CBC WITH DIFFERENTIAL/PLATELET
Abs Immature Granulocytes: 0.05 10*3/uL (ref 0.00–0.07)
Basophils Absolute: 0 10*3/uL (ref 0.0–0.1)
Basophils Relative: 0 %
Eosinophils Absolute: 0.6 10*3/uL — ABNORMAL HIGH (ref 0.0–0.5)
Eosinophils Relative: 5 %
HCT: 34 % — ABNORMAL LOW (ref 39.0–52.0)
Hemoglobin: 11.3 g/dL — ABNORMAL LOW (ref 13.0–17.0)
Immature Granulocytes: 0 %
Lymphocytes Relative: 28 %
Lymphs Abs: 3.5 10*3/uL (ref 0.7–4.0)
MCH: 33.5 pg (ref 26.0–34.0)
MCHC: 33.2 g/dL (ref 30.0–36.0)
MCV: 100.9 fL — ABNORMAL HIGH (ref 80.0–100.0)
Monocytes Absolute: 1 10*3/uL (ref 0.1–1.0)
Monocytes Relative: 8 %
Neutro Abs: 7.4 10*3/uL (ref 1.7–7.7)
Neutrophils Relative %: 59 %
Platelets: 255 10*3/uL (ref 150–400)
RBC: 3.37 MIL/uL — ABNORMAL LOW (ref 4.22–5.81)
RDW: 12.7 % (ref 11.5–15.5)
WBC: 12.5 10*3/uL — ABNORMAL HIGH (ref 4.0–10.5)
nRBC: 0 % (ref 0.0–0.2)

## 2020-11-16 MED ORDER — PREDNISONE 5 MG PO TABS: 5.0000 mg | ORAL_TABLET | Freq: Every day | ORAL | 0 refills | Status: DC

## 2020-11-16 NOTE — Progress Notes (Signed)
[place] NOTE  Patient Care Team: [name], [name], [name], MD as Consulting Physician (Gastroenterology) Clyde Canterbury, MD as Referring Physician (Otolaryngology) Ottie Glazier, MD as Consulting Physician (Pulmonary Disease)  CHIEF COMPLAINTS/PURPOSE OF CONSULTATION: Bladder cancer   Oncology History Overview Note  # AUG 2019-TRANSITIONAL CELL BLADDER CA [~ 4cm tumor] s/p cystoscopy [Dr.Stoiff]  with extensive angiolymphatic invasion; [name]; [name]; mediastinal uptake; right pubic rami uptake.  # [name]; Jan 18th 2021- switched to [place]- [pt preference; [name]; July 2020 cystoscopy-no evidence of bladder malignancy/Dr. Bernardo Heater- enlarged prostate [PSA- 0.95; 2021]  # Molecular testing- PDL-1 CPS- 20%; [name]  ;GOALS: palliative  CURRENT/MOST RECENT THERAPY:OPDIVO [C]     Cancer of overlapping sites of bladder ([place])  02/28/2018 - 06/09/2019 Chemotherapy   The patient had atezolizumab (TECENTRIQ) 1,[name], 1,200 mg, Intravenous, Once, [name],200 mg (02/28/2018), 1,200 mg (03/21/2018), 1,200 mg (04/11/2018), 1,200 mg (05/02/2018), 1,200 mg (06/13/2018), 1,200 mg (05/23/2018), 1,200 mg (07/04/2018), 1,200 mg (07/25/2018), 1,200 mg (08/15/2018), 1,200 mg (09/05/2018), 1,200 mg (10/25/2018), 1,200 mg (11/22/2018), 1,200 mg (12/20/2018), 1,200 mg (01/10/2019), 1,200 mg (01/31/2019), 1,200 mg (02/21/2019), 1,200 mg (03/14/2019), 1,200 mg (04/04/2019), 1,200 mg (04/25/2019), 1,200 mg  (05/16/2019), 1,200 mg (06/09/2019)  for chemotherapy treatment.    06/30/2019 -  Chemotherapy   The patient had nivolumab (OPDIVO) 240 mg in sodium chloride 0.9 % 100 mL chemo infusion, 240 mg, Intravenous, Once, 22 of 23 cycles Administration: 240 mg (06/30/2019), 240 mg (07/14/2019), 240 mg (07/28/2019), 240 mg (08/11/2019), 240 mg (08/25/2019), 240 mg (09/08/2019), 240 mg (09/22/2019), 240 mg (11/03/2019), 240 mg (11/17/2019), 240 mg (12/10/2019), 240 mg (12/24/2019), 240 mg (01/08/2020), 240 mg (01/22/2020), 240 mg (02/05/2020), 240 mg (02/19/2020), 240 mg (03/04/2020), 240 mg (03/18/2020), 240 mg (04/01/2020), 240 mg (04/29/2020), 240 mg (05/13/2020), 240 mg (05/27/2020), 240 mg (06/17/2020)  for chemotherapy treatment.     HISTORY OF PRESENTING ILLNESS: Edward Cummings 87 y.o.  male with metastatic transitional carcinoma of the bladder most recently on Opdivo [currently on hold because of poor tolerance] is here for follow-up/review results of the brain MRI  Patient denies any worsening shortness of breath no cough.  No abdominal pain.  No blood in urine.  No blood in stools or black-colored stools.  Patient is gaining weight.  Appetite improving.  He continues to be on prednisone 10 mg a day.  No falls.   Review of Systems  Constitutional: Positive for malaise/fatigue. Negative for chills, diaphoresis and fever.  HENT: Negative for nosebleeds and sore throat.   Eyes: Negative for double vision.  Respiratory: Negative for hemoptysis, sputum production and wheezing.   Cardiovascular: Negative for chest pain, palpitations, orthopnea and leg swelling.  Gastrointestinal: Negative for abdominal pain, blood in stool, diarrhea, heartburn, melena, nausea and vomiting.  Genitourinary: Negative for dysuria.  Musculoskeletal: Positive for back pain and joint pain.  Skin: Negative.  Negative for itching and rash.  Neurological: Negative for tingling, focal weakness and headaches.  Endo/Heme/Allergies: Does not  bruise/bleed easily.  Psychiatric/Behavioral: Negative for  depression. The patient is not nervous/anxious and does not have insomnia.      MEDICAL HISTORY:  Past Medical History:  Diagnosis Date  . Anemia   . Anxiety 12/31/2019  . Cancer ([place])    bladder  . Chronic kidney disease   . Depression   . Hypertension   . Neuromuscular disorder ([place])    Nerve damage to left face/eye since around 07-31-2000.    SURGICAL HISTORY: Past Surgical History:  Procedure Laterality Date  . CYSTOSCOPY W/ RETROGRADES Bilateral 01/25/2018   Procedure: CYSTOSCOPY WITH RETROGRADE PYELOGRAM;  [name], MD;  [name];  Service: Urology;  Laterality: Bilateral;  . CYSTOSCOPY W/ URETERAL STENT PLACEMENT Bilateral 01/06/2019   Procedure: CYSTOSCOPY WITH RETROGRADE PYELOGRAM/URETERAL STENT REMOVAL;  [name], MD;  [name];  Service: Urology;  Laterality: Bilateral;  . CYSTOSCOPY WITH BIOPSY N/A 09/21/2020   Procedure: CYSTOSCOPY WITH BLADDER BIOPSY;  [name], MD;  [name];  Service: Urology;  [name];  . CYSTOSCOPY WITH FULGERATION N/A 09/21/2020   Procedure: CYSTOSCOPY WITH FULGERATION;  [name], MD;  [name];  Service: Urology;  [name];  . CYSTOSCOPY WITH STENT PLACEMENT Bilateral 01/25/2018   Procedure: CYSTOSCOPY WITH STENT PLACEMENT;  [name], MD;  [name];  Service: Urology;  Laterality: Bilateral;  . DORSAL SLIT N/A 01/06/2019   Procedure: DORSAL SLIT;  [name], MD;  [name];  Service: Urology;  [name];  . ESOPHAGOGASTRODUODENOSCOPY (EGD) WITH PROPOFOL N/A 11/12/2019   Procedure: ESOPHAGOGASTRODUODENOSCOPY (EGD) WITH PROPOFOL;  Surgeon: Virgel Manifold, MD;  Location: [facility];  Service: Endoscopy;  [name];  . EYE SURGERY     Cornea transplants bilaterally & cataract surgery.  . TRANSURETHRAL RESECTION  OF BLADDER TUMOR N/A 01/25/2018   Procedure: TRANSURETHRAL RESECTION OF BLADDER TUMOR (TURBT);  [name], MD;  [name];  Service: Urology;  [name];    SOCIAL HISTORY: Social History   Socioeconomic History  . Marital status: Widowed    Spouse name: [name]  . Number of children: Not on file  . Years of education: Not on file  . Highest education level: Not on file  Occupational History  . Not on file  Tobacco Use  . Smoking status: Former Smoker    Types: Cigarettes  . Smokeless tobacco: Current User    Types: Chew  . Tobacco comment: Stopped approximately 10 years ago.  Vaping Use  . Vaping Use: Never used  Substance and Sexual Activity  . Alcohol use: Not on file    Comment: occasionally  . Drug use: Never  . Sexual activity: Yes    Birth control/protection: None  Other Topics Concern  . Not on file  Social History Narrative    lives in [place]; with son. Wife died in 07-31-2018.  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     Social Determinants of Health   Financial Resource Strain: Not on file  Food Insecurity: Not on file  Transportation Needs: Not on file  Physical Activity: Not on file  Stress: Not on file  Social Connections: Not on file  Intimate Partner Violence: Not on file    FAMILY HISTORY: Family History  Problem Relation Age of Onset  . Prostate cancer Neg Hx   . Kidney cancer Neg Hx   . Bladder Cancer Neg Hx  ALLERGIES:  has No Known Allergies.  MEDICATIONS:  Current Outpatient Medications  Medication Sig Dispense Refill  . aspirin EC 81 MG tablet Take 81 mg by mouth daily.    . Multiple Vitamin (MULTIVITAMIN WITH MINERALS) TABS tablet Take 1 tablet by mouth daily.     . prednisoLONE acetate (PRED FORTE) 1 % ophthalmic suspension SMARTSIG:In Eye(s)    . levothyroxine (SYNTHROID) 88 MCG tablet TAKE 1 TABLET BY MOUTH ONCE A DAY. TAKE ON AN EMPTY STOMACH WITH A GLASS OF WATER  ATLEAST 30-60 MIN BEFORE BREAKFAST (Patient not taking: Reported on 11/16/2020) 90 tablet 3  . predniSONE (DELTASONE) 5 MG tablet Take 1 tablet (5 mg total) by mouth daily with breakfast. 60 tablet 0   No current facility-administered medications for this visit.      Marland Kitchen  PHYSICAL EXAMINATION: ECOG PERFORMANCE STATUS: 1 - Symptomatic but completely ambulatory  Vitals:   11/16/20 1032  BP: (!) 152/70  Pulse: (!) 54  Resp: 16  Temp: (!) 96.6 F (35.9 C)  SpO2: 100%   Filed Weights   11/16/20 1032  Weight: 125 lb 6.4 oz (56.9 kg)    Physical Exam Constitutional:      Comments: Walking himself.  Thin built moderately nourished male patient.  Accompanied by son.  HENT:     Head: Normocephalic and atraumatic.     Mouth/Throat:     Pharynx: No oropharyngeal exudate.  Eyes:     Pupils: Pupils are equal, round, and reactive to light.     Comments: Chronic drooping of the left eyelid.  Cardiovascular:     Rate and Rhythm: Normal rate and regular rhythm.  Pulmonary:     Effort: No respiratory distress.     Breath sounds: No wheezing.  Abdominal:     General: Bowel sounds are normal. There is no distension.     Palpations: Abdomen is soft. There is no mass.     Tenderness: There is no abdominal tenderness. There is no guarding or rebound.  Musculoskeletal:        General: No tenderness. Normal range of motion.     Cervical back: Normal range of motion and neck supple.  Skin:    General: Skin is warm.  Neurological:     Mental Status: He is alert and oriented to person, place, and time.  Psychiatric:        Mood and Affect: Affect normal.      LABORATORY DATA:  I have reviewed the data as listed Lab Results  Component Value Date   WBC 12.5 (H) 11/16/2020   HGB 11.3 (L) 11/16/2020   HCT 34.0 (L) 11/16/2020   MCV 100.9 (H) 11/16/2020   PLT 255 11/16/2020   Recent Labs    02/05/20 0835 02/19/20 0813 03/04/20 0801 03/18/20 0819 09/10/20 1255 10/11/20 1238  11/16/20 1018  NA 135 135 137   < > 138 138 137  K 4.3 4.7 4.7   < > 4.8 5.0 3.9  CL 107 106 107   < > 107 103 105  CO2 21* 21* 23   < > _0 GLUCOSE 139* 101* 98   < > 115* 103* 73  BUN 49* 34* 33*   < > 31* 30* 35*  CREATININE 2.30* 2.24* 2.29*   < > 1.85* 1.92* 2.11*  CALCIUM 9.0 8.9 8.9   < > 9.0 9.5 9.0  GFRNONAA 25* 26* 25*   < > 35* 34* 30*  GFRAA 29* 30* 29*  --   --   --   --  PROT 7.1 7.0 7.0   < > 6.2* 7.0 6.6  ALBUMIN 4.0 3.9 4.0   < > 3.5 3.6 3.7  AST _0 < > _1 ALT _2 < > _3 ALKPHOS 70 70 65   < > 51 68 61  BILITOT 0.6 0.8 0.6   < > 0.6 0.6 0.5   < > = values in this interval not displayed.    RADIOGRAPHIC STUDIES: I have personally reviewed the radiological images as listed and agreed with the findings in the report. MR Brain W Wo Contrast  Result Date: 11/11/2020 CLINICAL DATA:  Metastatic bladder cancer. EXAM: MRI HEAD WITHOUT AND WITH CONTRAST TECHNIQUE: Multiplanar, multiecho pulse sequences of the brain and surrounding structures were obtained without and with intravenous contrast. CONTRAST:  38m GADAVIST GADOBUTROL 1 MMOL/ML IV SOLN COMPARISON:  07/09/2020 FINDINGS: Brain: There is no evidence of an acute infarct, intracranial hemorrhage, mass, midline shift, or extra-axial fluid collection. Scattered small T2 hyperintensities in the cerebral white matter are unchanged and within normal limits for age. There is mild generalized cerebral atrophy. A 2 mm focus of enhancement along the anterior surface of the right frontal lobe is unchanged and attributed to vascular enhancement. No suspicious intracranial enhancement is identified. Vascular: Major intracranial vascular flow voids are preserved. Skull and upper cervical spine: Unremarkable bone marrow signal. Sinuses/Orbits: Bilateral cataract extraction. Clear paranasal sinuses. Trace right mastoid fluid. Other: Persistent benign-appearing cystic focus in the posterior nasopharynx  measuring 1.8 cm. Small polyp or mucous in the posterior right nasal cavity extending into the nasopharynx. IMPRESSION: 1. No evidence of intracranial metastases. 2. Unchanged 2 mm enhancing focus along the anterior right frontal lobe consistent with benign vascular enhancement. Electronically Signed   By: ALogan BoresM.D.   On: 11/11/2020 11:33    ASSESSMENT & PLAN:   Cancer of overlapping sites of bladder ([place] # High-grade transitional cell carcinoma of the bladder metastatic to retroperitoneal lymph node.  Stage IV- CT A/P- MARCH 2022- [facility]-no evidence of any distant metastatic disease in the abdomen pelvis; bladder wall thickening/nodularity concerning for malignancy.-STABLE.    # Continue to hold Opdivo given patient's overall poor tolerance to therapy; continue surveillance for now.  We will plan imaging in the next 2 to 3 months.  # Local recurrent bladder ca < 1cm [Dr.Stoiof March 2022]- agree with TURBT-STABLE.   #CT chest groundglass opacities-less likely infection; suspect hypersensitivity pneumonitis-continue prednisone; taper to 5 mg a day.  [facility] # Iatrogenic hypothyroidism-on Synthroid 88 mcg; TSH JAN 2022- WNL. STABLE  # Anemia sec to CKD-III-IV/ on Iv iron.Hb-11-stable.  Hold Venofer today.  # JAN 28th, 2022-incidental 2 mm enhancing focus along the anterior right frontal lobe; June 5 MRI brain-negative for any acute process/benign vascular enhancement.  No further MRI is recommended.  # CKD stageIII-GFR-30-STABLE  # DISPOSITION:   # HOLD  VENOFER # referral to Dr.Aleskerov/ Pulmonary re: abnormal CT chest- 2nd time making the referral # Follow-up in  128monthD labs-cbc/cmp/possible venofer; Dr.B  # I reviewed the blood work- with the patient in detail; also reviewed the imaging independently [as summarized above]; and with the patient in detail.      All questions were answered. The patient knows to call the clinic with  any problems, questions or concerns.    GoCammie SickleMD 11/16/2020 11:25 AM

## 2020-11-16 NOTE — Assessment & Plan Note (Addendum)
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  Stage IV- CT A/P- MARCH 2022- [facility]-no evidence of any distant metastatic disease in the abdomen pelvis; bladder wall thickening/nodularity concerning for malignancy.-STABLE.    # Continue to hold Opdivo given patient's overall poor tolerance to therapy; continue surveillance for now.  We will plan imaging in the next 2 to 3 months.  # Local recurrent bladder ca < 1cm [Dr.Stoiof March 2022]- agree with TURBT-STABLE.   #CT chest groundglass opacities-less likely infection; suspect hypersensitivity pneumonitis-continue prednisone; taper to 5 mg a day.  We will make again  Referral to [facility]  # Iatrogenic hypothyroidism-on Synthroid 88 mcg; TSH JAN 2022- WNL. STABLE  # Anemia sec to CKD-III-IV/ on Iv iron.Hb-11-stable.  Hold Venofer today.  # JAN 28th, 2022-incidental 2 mm enhancing focus along the anterior right frontal lobe; June 5 MRI brain-negative for any acute process/benign vascular enhancement.  No further MRI is recommended.  # CKD stageIII-GFR-30-STABLE  # DISPOSITION:   # HOLD  VENOFER # referral to Dr.Aleskerov/ Pulmonary re: abnormal CT chest- 2nd time making the referral # Follow-up in  1month MD labs-cbc/cmp/possible venofer; Dr.B  # I reviewed the blood work- with the patient in detail; also reviewed the imaging independently [as summarized above]; and with the patient in detail.

## 2020-11-16 NOTE — Progress Notes (Signed)
rx for prednisone. Son states his rt eye has been red and has not hit anything, showed up since his CT scan. Not painful.

## 2020-11-16 NOTE — Telephone Encounter (Signed)
RN called [facility] Pulmonary dept and spoke with receptionist.  Notified that an external referral was made and referral request, notes and pt demographics were sent via fax and fax confirmation was received.  Receptionist stated she was check for fax and call if any further questions or information was needed.

## 2020-11-22 ENCOUNTER — Encounter: Payer: Self-pay | Admitting: Internal Medicine

## 2020-11-22 ENCOUNTER — Ambulatory Visit (INDEPENDENT_AMBULATORY_CARE_PROVIDER_SITE_OTHER): Payer: Medicare HMO | Admitting: Internal Medicine

## 2020-11-22 ENCOUNTER — Other Ambulatory Visit: Payer: Self-pay

## 2020-11-22 VITALS — BP 140/72 | HR 68 | Ht 68.0 in | Wt 123.5 lb

## 2020-11-22 DIAGNOSIS — C678 Malignant neoplasm of overlapping sites of bladder: Secondary | ICD-10-CM | POA: Diagnosis not present

## 2020-11-22 DIAGNOSIS — Z716 Tobacco abuse counseling: Secondary | ICD-10-CM | POA: Diagnosis not present

## 2020-11-22 DIAGNOSIS — N1831 Chronic kidney disease, stage 3a: Secondary | ICD-10-CM | POA: Diagnosis not present

## 2020-11-22 DIAGNOSIS — I10 Essential (primary) hypertension: Secondary | ICD-10-CM | POA: Diagnosis not present

## 2020-11-22 DIAGNOSIS — J301 Allergic rhinitis due to pollen: Secondary | ICD-10-CM | POA: Diagnosis not present

## 2020-11-22 NOTE — Assessment & Plan Note (Signed)
Patient was advised to take Claritin 10 mg p.o. daily as needed for allergies.

## 2020-11-22 NOTE — Assessment & Plan Note (Signed)
-   I instructed the patient to stop chewing and provided them with smoking cessation materials.  - I informed the patient that smoking puts them at increased risk for cancer, COPD, hypertension, and more.  - Informed the patient to seek help if they begin to have trouble breathing, develop chest pain, start to cough up blood, feel faint, or pass out.

## 2020-11-22 NOTE — Assessment & Plan Note (Signed)
Stable at the present time. 

## 2020-11-22 NOTE — Assessment & Plan Note (Signed)

## 2020-11-22 NOTE — Assessment & Plan Note (Signed)
Patient sees his kidney doctor off-and-on

## 2020-11-22 NOTE — Progress Notes (Signed)
Established Patient Office Visit  Subjective:  Patient ID: Edward Cummings, male    DOB: [birth year]  Age: 87 y.o. MRN: [id number]  CC:  Chief Complaint  Patient presents with   Follow-up    HPI  Edward Cummings presents for check up, complaining of allergic bronchitis.  He is also known to have anemia anxiety has a chronic kidney disease hypertension with hypertensive cardiovascular disease and the neuromuscular disorder of the left eye.  He has a lot of damage to the left face and left eye since 2002.  He does not smoke does not drink.  he did tobacco once in a while.  Past Medical History:  Diagnosis Date   Anemia    Anxiety 12/31/2019   Cancer [facility])    bladder   Chronic kidney disease    Depression    Hypertension    Neuromuscular disorder ([place])    Nerve damage to left face/eye since around 2002.    Past Surgical History:  Procedure Laterality Date   CYSTOSCOPY W/ RETROGRADES Bilateral 01/25/2018   Procedure: CYSTOSCOPY WITH RETROGRADE PYELOGRAM;  [name], MD;  [name];  Service: Urology;  Laterality: Bilateral;   CYSTOSCOPY W/ URETERAL STENT PLACEMENT Bilateral 01/06/2019   Procedure: CYSTOSCOPY WITH RETROGRADE PYELOGRAM/URETERAL STENT REMOVAL;  [name], MD;  [name];  Service: Urology;  Laterality: Bilateral;   CYSTOSCOPY WITH BIOPSY N/A 09/21/2020   Procedure: CYSTOSCOPY WITH BLADDER BIOPSY;  [name], MD;  [name];  Service: Urology;  [name];   CYSTOSCOPY WITH FULGERATION N/A 09/21/2020   Procedure: CYSTOSCOPY WITH FULGERATION;  [name], MD;  [name];  Service: Urology;  [name];   CYSTOSCOPY WITH STENT PLACEMENT Bilateral 01/25/2018   Procedure: CYSTOSCOPY WITH STENT PLACEMENT;  [name], MD;  [name];  Service: Urology;  Laterality: Bilateral;   DORSAL SLIT N/A 01/06/2019   Procedure: DORSAL SLIT;  [name], MD;  [name];  Service: Urology;  [name];   ESOPHAGOGASTRODUODENOSCOPY (EGD) WITH PROPOFOL N/A 11/12/2019   Procedure: ESOPHAGOGASTRODUODENOSCOPY (EGD) WITH PROPOFOL;  Surgeon: Virgel Manifold, MD;  Location: [facility] ENDOSCOPY;  Service: Endoscopy;  [name];   EYE SURGERY     Cornea transplants bilaterally & cataract surgery.   TRANSURETHRAL RESECTION OF BLADDER TUMOR N/A 01/25/2018   Procedure: TRANSURETHRAL RESECTION OF BLADDER TUMOR (TURBT);  [name], MD;  [name];  Service: Urology;  [name];    Family History  Problem Relation Age of Onset   Prostate cancer Neg Hx    Kidney cancer Neg Hx    Bladder Cancer Neg Hx     Social History   Socioeconomic History   Marital status: Widowed    Spouse name: [name]   Number of children: Not on file   Years of education: Not on file   Highest education level: Not on file  Occupational History   Not on file  Tobacco Use   Smoking status: Former    Pack years: 0.00    Types: Cigarettes   Smokeless tobacco: Current    Types: Chew   Tobacco comments:    Stopped approximately 10 years ago.  Vaping Use   Vaping Use: Never used  Substance and Sexual Activity   Alcohol use: Not on file    Comment: occasionally   Drug use: Never   Sexual activity: Yes  Birth control/protection: None  Other Topics Concern   Not on file  Social History Narrative    lives in [place]; with son. Wife died in 09-10-2018.  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     Social Determinants of Health   Financial Resource Strain: Not on file  Food Insecurity: Not on file  Transportation Needs: Not on file  Physical Activity: Not on file  Stress: Not on file  Social Connections: Not on file  Intimate Partner Violence: Not on file     Current Outpatient Medications:    aspirin EC 81 MG tablet, Take 81 mg by mouth daily., Disp: , Rfl:    levothyroxine  (SYNTHROID) 88 MCG tablet, TAKE 1 TABLET BY MOUTH ONCE A DAY. TAKE ON AN EMPTY STOMACH WITH A GLASS OF WATER ATLEAST 30-60 MIN BEFORE BREAKFAST, Disp: 90 tablet, Rfl: 3   Multiple Vitamin (MULTIVITAMIN WITH MINERALS) TABS tablet, Take 1 tablet by mouth daily. , Disp: , Rfl:    prednisoLONE acetate (PRED FORTE) 1 % ophthalmic suspension, SMARTSIG:In Eye(s), Disp: , Rfl:    predniSONE (DELTASONE) 5 MG tablet, Take 1 tablet (5 mg total) by mouth daily with breakfast., Disp: 60 tablet, Rfl: 0   No Known Allergies  ROS Review of Systems  Constitutional: Negative.   HENT: Negative.    Eyes:  Positive for visual disturbance.  Respiratory:  Negative for cough.   Genitourinary:  Negative for frequency.  Allergic/Immunologic: Negative.   Neurological: Negative.   Hematological: Negative.   Psychiatric/Behavioral: Negative.       Objective:    Physical Exam Vitals reviewed.  Constitutional:      Appearance: Normal appearance.  HENT:     Mouth/Throat:     Mouth: Mucous membranes are moist.  Eyes:     Pupils: Pupils are equal, round, and reactive to light.  Neck:     Vascular: No carotid bruit.  Cardiovascular:     Rate and Rhythm: Normal rate and regular rhythm.     Pulses: Normal pulses.     Heart sounds: Normal heart sounds.  Pulmonary:     Effort: Pulmonary effort is normal.     Breath sounds: Normal breath sounds.  Abdominal:     General: Bowel sounds are normal.     Palpations: Abdomen is soft. There is no hepatomegaly, splenomegaly or mass.     Tenderness: There is no abdominal tenderness.     Hernia: No hernia is present.  Musculoskeletal:     Cervical back: Neck supple.     Right lower leg: No edema.     Left lower leg: No edema.  Skin:    Findings: No rash.  Neurological:     Mental Status: He is alert and oriented to person, place, and time.     Motor: No weakness.  Psychiatric:        Mood and Affect: Mood normal.        Behavior: Behavior normal.    BP  140/72   Pulse 68   Ht 5\' 8"  (1.727 m)   Wt 123 lb 8 oz (56 kg)   BMI 18.78 kg/m  Wt Readings from Last 3 Encounters:  11/22/20 123 lb 8 oz (56 kg)  11/16/20 125 lb 6.4 oz (56.9 kg)  10/14/20 120 lb 6.4 oz (54.6 kg)     Health Maintenance Due  Topic Date Due   COVID-19 Vaccine (1) Never done   TETANUS/TDAP  Never done   Zoster  Vaccines- Shingrix (1 of 2) Never done   PNA vac Low Risk Adult (2 of 2 - PCV13) 04/09/2019    There are no preventive care reminders to display for this patient.  Lab Results  Component Value Date   TSH 1.695 07/01/2020   Lab Results  Component Value Date   WBC 12.5 (H) 11/16/2020   HGB 11.3 (L) 11/16/2020   HCT 34.0 (L) 11/16/2020   MCV 100.9 (H) 11/16/2020   PLT 255 11/16/2020   Lab Results  Component Value Date   NA 137 11/16/2020   K 3.9 11/16/2020   CO2 23 11/16/2020   GLUCOSE 73 11/16/2020   BUN 35 (H) 11/16/2020   CREATININE 2.11 (H) 11/16/2020   BILITOT 0.5 11/16/2020   ALKPHOS 61 11/16/2020   AST 21 11/16/2020   ALT 18 11/16/2020   PROT 6.6 11/16/2020   ALBUMIN 3.7 11/16/2020   CALCIUM 9.0 11/16/2020   ANIONGAP 9 11/16/2020   No results found for: CHOL No results found for: HDL No results found for: LDLCALC No results found for: TRIG No results found for: CHOLHDL Lab Results  Component Value Date   HGBA1C 4.5 (L) 04/13/2018      Assessment & Plan:   Problem List Items Addressed This Visit       Cardiovascular and Mediastinum   Hypertension - Primary    Patient blood pressure is normal patient denies any chest pain or shortness of breath there is no history of palpitation or paroxysmal nocturnal dyspnea   patient was advised to follow low-salt low-cholesterol diet    ideally I want to keep systolic blood pressure below 130 mmHg, patient was asked to check blood pressure one times a week and give me a report on that.  Patient will be follow-up in 3 months  or earlier as needed, patient will call me back for any  change in the cardiovascular symptoms Patient was advised to buy a book from local bookstore concerning blood pressure and read several chapters  every day.  This will be supplemented by some of the material we will give him from the office.  Patient should also utilize other resources like YouTube and Internet to learn more about the blood pressure and the diet.         Respiratory   Seasonal allergic rhinitis due to pollen    Patient was advised to take Claritin 10 mg p.o. daily as needed for allergies.         Genitourinary   Cancer of overlapping sites of bladder [facility])    Patient sees his kidney doctor off-and-on       Stage 3a chronic kidney disease ([place])    Stable at the present time.         Other   Tobacco abuse counseling    - I instructed the patient to stop chewing and provided them with smoking cessation materials.  - I informed the patient that smoking puts them at increased risk for cancer, COPD, hypertension, and more.  - Informed the patient to seek help if they begin to have trouble breathing, develop chest pain, start to cough up blood, feel faint, or pass out.        No orders of the defined types were placed in this encounter. Patient was advised to take over-the-counter iron pill every day. Mri ofhead is unremarkable, patient has a high-grade transitional cell carcinoma of the bladder with metastasis to the retroperitoneal lymph node.  Patient is being tapered off  of prednisone 5 mg p.o. daily chest CT showed groundglass opacity which was suspicious for hypersensitivity.  Patient is mildly anemic due to CKD.  Last GFR is 30 which is stable.    [name], MD

## 2020-12-14 ENCOUNTER — Telehealth: Payer: Self-pay | Admitting: *Deleted

## 2020-12-14 NOTE — Telephone Encounter (Signed)
RN reviewed patient's chart. [facility] pulmonary apt has not been scheduled. I called the [facility] pulmonary and the receptionist stated that no referral was received (even though fax confirmation was received on our end).  I refaxed the referral and marked it urgent- attention Kay.

## 2020-12-15 ENCOUNTER — Inpatient Hospital Stay (HOSPITAL_BASED_OUTPATIENT_CLINIC_OR_DEPARTMENT_OTHER): Payer: Medicare HMO | Admitting: Internal Medicine

## 2020-12-15 ENCOUNTER — Inpatient Hospital Stay: Payer: Medicare HMO | Attending: Internal Medicine

## 2020-12-15 ENCOUNTER — Inpatient Hospital Stay: Payer: Medicare HMO

## 2020-12-15 ENCOUNTER — Encounter: Payer: Self-pay | Admitting: Internal Medicine

## 2020-12-15 DIAGNOSIS — E039 Hypothyroidism, unspecified: Secondary | ICD-10-CM | POA: Diagnosis not present

## 2020-12-15 DIAGNOSIS — C772 Secondary and unspecified malignant neoplasm of intra-abdominal lymph nodes: Secondary | ICD-10-CM | POA: Insufficient documentation

## 2020-12-15 DIAGNOSIS — D631 Anemia in chronic kidney disease: Secondary | ICD-10-CM | POA: Diagnosis not present

## 2020-12-15 DIAGNOSIS — Z7952 Long term (current) use of systemic steroids: Secondary | ICD-10-CM | POA: Insufficient documentation

## 2020-12-15 DIAGNOSIS — N183 Chronic kidney disease, stage 3 unspecified: Secondary | ICD-10-CM | POA: Diagnosis not present

## 2020-12-15 DIAGNOSIS — Z7982 Long term (current) use of aspirin: Secondary | ICD-10-CM | POA: Diagnosis not present

## 2020-12-15 DIAGNOSIS — C678 Malignant neoplasm of overlapping sites of bladder: Secondary | ICD-10-CM

## 2020-12-15 DIAGNOSIS — Z87891 Personal history of nicotine dependence: Secondary | ICD-10-CM | POA: Diagnosis not present

## 2020-12-15 DIAGNOSIS — Z79899 Other long term (current) drug therapy: Secondary | ICD-10-CM | POA: Diagnosis not present

## 2020-12-15 LAB — COMPREHENSIVE METABOLIC PANEL
ALT: 17 U/L (ref 0–44)
AST: 20 U/L (ref 15–41)
Albumin: 3.5 g/dL (ref 3.5–5.0)
Alkaline Phosphatase: 66 U/L (ref 38–126)
Anion gap: 6 (ref 5–15)
BUN: 33 mg/dL — ABNORMAL HIGH (ref 8–23)
CO2: 25 mmol/L (ref 22–32)
Calcium: 9.2 mg/dL (ref 8.9–10.3)
Chloride: 107 mmol/L (ref 98–111)
Creatinine, Ser: 2.02 mg/dL — ABNORMAL HIGH (ref 0.61–1.24)
GFR, Estimated: 32 mL/min — ABNORMAL LOW (ref >60)
Glucose, Bld: 104 mg/dL — ABNORMAL HIGH (ref 70–99)
Potassium: 4.4 mmol/L (ref 3.5–5.1)
Sodium: 138 mmol/L (ref 135–145)
Total Bilirubin: 0.6 mg/dL (ref 0.3–1.2)
Total Protein: 6.7 g/dL (ref 6.5–8.1)

## 2020-12-15 LAB — CBC WITH DIFFERENTIAL/PLATELET
Abs Immature Granulocytes: 0.06 10*3/uL (ref 0.00–0.07)
Basophils Absolute: 0.1 10*3/uL (ref 0.0–0.1)
Basophils Relative: 0 %
Eosinophils Absolute: 0.7 10*3/uL — ABNORMAL HIGH (ref 0.0–0.5)
Eosinophils Relative: 6 %
HCT: 34.7 % — ABNORMAL LOW (ref 39.0–52.0)
Hemoglobin: 11.6 g/dL — ABNORMAL LOW (ref 13.0–17.0)
Immature Granulocytes: 1 %
Lymphocytes Relative: 17 %
Lymphs Abs: 1.9 10*3/uL (ref 0.7–4.0)
MCH: 33.6 pg (ref 26.0–34.0)
MCHC: 33.4 g/dL (ref 30.0–36.0)
MCV: 100.6 fL — ABNORMAL HIGH (ref 80.0–100.0)
Monocytes Absolute: 1.1 10*3/uL — ABNORMAL HIGH (ref 0.1–1.0)
Monocytes Relative: 10 %
Neutro Abs: 7.5 10*3/uL (ref 1.7–7.7)
Neutrophils Relative %: 66 %
Platelets: 324 10*3/uL (ref 150–400)
RBC: 3.45 MIL/uL — ABNORMAL LOW (ref 4.22–5.81)
RDW: 12.8 % (ref 11.5–15.5)
WBC: 11.3 10*3/uL — ABNORMAL HIGH (ref 4.0–10.5)
nRBC: 0 % (ref 0.0–0.2)

## 2020-12-15 MED ORDER — PREDNISONE 5 MG PO TABS: 5.0000 mg | ORAL_TABLET | Freq: Every day | ORAL | 0 refills | Status: DC

## 2020-12-15 NOTE — Progress Notes (Signed)
Edward Cummings NOTE  Patient Care Team: Edward Athens, MD as PCP - General (Internal Medicine) Edward Sickle, MD as Consulting Physician (Hematology and Oncology) Edward Manifold, MD as Consulting Physician (Gastroenterology) Edward Canterbury, MD as Referring Physician (Otolaryngology) Edward Glazier, MD as Consulting Physician (Pulmonary Disease) Edward Faster, RN as [employer]  CHIEF COMPLAINTS/PURPOSE OF CONSULTATION: Bladder cancer   Oncology History Overview Note  # AUG 2019-TRANSITIONAL CELL BLADDER CA [~ 4cm tumor] s/p cystoscopy [Edward Cummings]  with extensive angiolymphatic invasion; [name]; [name]; mediastinal uptake; right pubic rami uptake.  # [id number] Cummings; Jan 18th 2021- switched to [place]- [pt preference; q2W]   # Match 2020- HYPOTHYROIDISM [sec to Edward Cummings]  # CKD stage III-IV [creat 2.5]; July 2020 cystoscopy-no evidence of bladder malignancy/Dr. Bernardo Cummings- enlarged prostate [PSA- 0.95; 2021]  # Molecular testing- PDL-1 CPS- 20%; [name]  ;GOALS: palliative  CURRENT/MOST RECENT THERAPY:OPDIVO [C]     Cancer of overlapping sites of bladder ([place])  02/28/2018 - 06/09/2019 Chemotherapy   The patient had atezolizumab (TECENTRIQ) 1,[name], 1,200 mg, Intravenous, Once, [name],200 mg (02/28/2018), 1,200 mg (03/21/2018), 1,200 mg (04/11/2018), 1,200 mg (05/02/2018), 1,200 mg (06/13/2018), 1,200 mg (05/23/2018), 1,200 mg (07/04/2018), 1,200 mg (07/25/2018), 1,200 mg (08/15/2018), 1,200 mg (09/05/2018), 1,200 mg (10/25/2018), 1,200 mg (11/22/2018), 1,200 mg (12/20/2018), 1,200 mg (01/10/2019), 1,200 mg (01/31/2019), 1,200 mg (02/21/2019), 1,200 mg  (03/14/2019), 1,200 mg (04/04/2019), 1,200 mg (04/25/2019), 1,200 mg (05/16/2019), 1,200 mg (06/09/2019)   for chemotherapy treatment.     06/30/2019 -  Chemotherapy   The patient had nivolumab (OPDIVO) 240 mg in sodium chloride 0.9 % 100 mL chemo infusion, 240 mg, Intravenous, Once, 22 of 23 cycles Administration: 240 mg (06/30/2019), 240 mg (07/14/2019), 240 mg (07/28/2019), 240 mg (08/11/2019), 240 mg (08/25/2019), 240 mg (09/08/2019), 240 mg (09/22/2019), 240 mg (11/03/2019), 240 mg (11/17/2019), 240 mg (12/10/2019), 240 mg (12/24/2019), 240 mg (01/08/2020), 240 mg (01/22/2020), 240 mg (02/05/2020), 240 mg (02/19/2020), 240 mg (03/04/2020), 240 mg (03/18/2020), 240 mg (04/01/2020), 240 mg (04/29/2020), 240 mg (05/13/2020), 240 mg (05/27/2020), 240 mg (06/17/2020)   for chemotherapy treatment.      HISTORY OF PRESENTING ILLNESS: Edward Cummings 87 y.o.  male with metastatic transitional carcinoma of the bladder most recently on Opdivo [currently on hold because of poor tolerance] is here for follow-up.  Patient denies any worsening shortness of breath or cough.  Continues to be on prednisone 5 mg a day.  Appetite is good.  No weight loss.  However still awaiting evaluation with pulmonary regarding abnormal CT changes noted on previous surveillance imaging.  Denies any blood in stools or black or stools.  Review of Systems  Constitutional:  Positive for malaise/fatigue. Negative for chills, diaphoresis and fever.  HENT:  Negative for nosebleeds and sore throat.   Eyes:  Negative for double vision.  Respiratory:  Negative for hemoptysis, sputum production and wheezing.   Cardiovascular:  Negative for chest pain, palpitations, orthopnea and leg swelling.  Gastrointestinal:  Negative for abdominal pain, blood in stool, diarrhea, heartburn, melena, nausea and vomiting.  Genitourinary:  Negative for dysuria.  Musculoskeletal:  Positive for back pain and joint pain.  Skin: Negative.  Negative for itching and  rash.   Neurological:  Negative for tingling, focal weakness and headaches.  Endo/Heme/Allergies:  Does not bruise/bleed easily.  Psychiatric/Behavioral:  Negative for depression. The patient is not nervous/anxious and does not have insomnia.     MEDICAL HISTORY:  Past Medical History:  Diagnosis Date  . Anemia   . Anxiety 12/31/2019  . Cancer ([place])    bladder  . Chronic kidney disease   . Depression   . Hypertension   . Neuromuscular disorder ([place])    Nerve damage to left face/eye since around 08-22-00.    SURGICAL HISTORY: Past Surgical History:  Procedure Laterality Date  . CYSTOSCOPY W/ RETROGRADES Bilateral 01/25/2018   Procedure: CYSTOSCOPY WITH RETROGRADE PYELOGRAM;  Surgeon: Edward Sons, MD;  [name];  Service: Urology;  Laterality: Bilateral;  . CYSTOSCOPY W/ URETERAL STENT PLACEMENT Bilateral 01/06/2019   Procedure: CYSTOSCOPY WITH RETROGRADE PYELOGRAM/URETERAL STENT REMOVAL;  Surgeon: Edward Sons, MD;  [name];  Service: Urology;  Laterality: Bilateral;  . CYSTOSCOPY WITH BIOPSY N/A 09/21/2020   Procedure: CYSTOSCOPY WITH BLADDER BIOPSY;  Surgeon: Edward Sons, MD;  [name];  Service: Urology;  [name];  . CYSTOSCOPY WITH FULGERATION N/A 09/21/2020   Procedure: CYSTOSCOPY WITH FULGERATION;  Surgeon: Edward Sons, MD;  [name];  Service: Urology;  [name];  . CYSTOSCOPY WITH STENT PLACEMENT Bilateral 01/25/2018   Procedure: CYSTOSCOPY WITH STENT PLACEMENT;  Surgeon: Edward Sons, MD;  [name];  Service: Urology;  Laterality: Bilateral;  . DORSAL SLIT N/A 01/06/2019   Procedure: DORSAL SLIT;  Surgeon: Edward Sons, MD;  [name];  Service: Urology;  [name];  . ESOPHAGOGASTRODUODENOSCOPY (EGD) WITH PROPOFOL N/A 11/12/2019   Procedure: ESOPHAGOGASTRODUODENOSCOPY (EGD) WITH PROPOFOL;  Surgeon: Edward Manifold, MD;  Location: [facility];  Service: Endoscopy;  Laterality:  N/A;  . EYE SURGERY     Cornea transplants bilaterally & cataract surgery.  . TRANSURETHRAL RESECTION OF BLADDER TUMOR N/A 01/25/2018   Procedure: TRANSURETHRAL RESECTION OF BLADDER TUMOR (TURBT);  Surgeon: Edward Sons, MD;  [name];  Service: Urology;  [name];    SOCIAL HISTORY: Social History   Socioeconomic History  . Marital status: Widowed    Spouse name: [name]  . Number of children: Not on file  . Years of education: Not on file  . Highest education level: Not on file  Occupational History  . Not on file  Tobacco Use  . Smoking status: Former    Pack years: 0.00    Types: Cigarettes  . Smokeless tobacco: Current    Types: Chew  . Tobacco comments:    Stopped approximately 10 years ago.  Vaping Use  . Vaping Use: Never used  Substance and Sexual Activity  . Alcohol use: Not on file    Comment: occasionally  . Drug use: Never  . Sexual activity: Yes    Birth control/protection: None  Other Topics Concern  . Not on file  Social History Narrative    lives in [place]; with son. Wife died in 08/22/18.  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     Social Determinants of Health   Financial Resource Strain: Not on file  Food Insecurity: Not on file  Transportation Needs: Not on file  Physical Activity: Not on file  Stress: Not on file  Social Connections: Not on file  Intimate Partner Violence: Not on file    FAMILY HISTORY: Family History  Problem Relation Age of Onset  . Prostate cancer Neg Hx   . Kidney cancer Neg Hx   . Bladder Cancer Neg Hx     ALLERGIES:  has No Known Allergies.  MEDICATIONS:  Current Outpatient Medications  Medication Sig Dispense Refill  . aspirin EC 81 MG tablet Take 81 mg by mouth daily.    Marland Kitchen levothyroxine (SYNTHROID) 88 MCG tablet TAKE 1 TABLET BY MOUTH ONCE A DAY. TAKE ON AN EMPTY STOMACH WITH A GLASS OF WATER ATLEAST 30-60 MIN BEFORE BREAKFAST 90 tablet 3  . Multiple  Vitamin (MULTIVITAMIN WITH MINERALS) TABS tablet Take 1 tablet by mouth daily.     . prednisoLONE acetate (PRED FORTE) 1 % ophthalmic suspension SMARTSIG:In Eye(s)    . predniSONE (DELTASONE) 5 MG tablet Take 1 tablet (5 mg total) by mouth daily with breakfast. 60 tablet 0  . predniSONE (DELTASONE) 5 MG tablet Take 1 tablet (5 mg total) by mouth daily with breakfast. 60 tablet 0   No current facility-administered medications for this visit.      Marland Kitchen  PHYSICAL EXAMINATION: ECOG PERFORMANCE STATUS: 1 - Symptomatic but completely ambulatory  Vitals:   12/15/20 1043  BP: 137/64  Pulse: 63  Resp: 18  Temp: 98.8 F (37.1 C)  SpO2: 99%   Filed Weights   12/15/20 1043  Weight: 126 lb 3.2 oz (57.2 kg)    Physical Exam Constitutional:      Comments: Walking himself.  Thin built moderately nourished male patient.  Accompanied by son.  HENT:     Head: Normocephalic and atraumatic.     Mouth/Throat:     Pharynx: No oropharyngeal exudate.  Eyes:     Pupils: Pupils are equal, round, and reactive to light.     Comments: Chronic drooping of the left eyelid.  Cardiovascular:     Rate and Rhythm: Normal rate and regular rhythm.  Pulmonary:     Effort: No respiratory distress.     Breath sounds: No wheezing.  Abdominal:     General: Bowel sounds are normal. There is no distension.     Palpations: Abdomen is soft. There is no mass.     Tenderness: no abdominal tenderness There is no guarding or rebound.  Musculoskeletal:        General: No tenderness. Normal range of motion.     Cervical back: Normal range of motion and neck supple.  Skin:    General: Skin is warm.  Neurological:     Mental Status: He is alert and oriented to person, place, and time.  Psychiatric:        Mood and Affect: Affect normal.     LABORATORY DATA:  I have reviewed the data as listed Lab Results  Component Value Date   WBC 11.3 (H) 12/15/2020   HGB 11.6 (L) 12/15/2020   HCT 34.7 (L) 12/15/2020   MCV  100.6 (H) 12/15/2020   PLT 324 12/15/2020   Recent Labs    02/05/20 0835 02/19/20 0813 03/04/20 0801 03/18/20 0819 10/11/20 1238 11/16/20 1018 12/15/20 1025  NA 135 135 137   < > 138 137 138  K 4.3 4.7 4.7   < > 5.0 3.9 4.4  CL 107 106 107   < > 103 105 107  CO2 21* 21* 23   < > '25 23 25  ' GLUCOSE 139* 101* 98   < > 103* 73 104*  BUN 49* 34* 33*   < > 30* 35* 33*  CREATININE 2.30* 2.24*  2.29*   < > 1.92* 2.11* 2.02*  CALCIUM 9.0 8.9 8.9   < > 9.5 9.0 9.2  GFRNONAA 25* 26* 25*   < > 34* 30* 32*  GFRAA 29* 30* 29*  --   --   --   --   PROT 7.1 7.0 7.0   < > 7.0 6.6 6.7  ALBUMIN 4.0 3.9 4.0   < > 3.6 3.7 3.5  AST '20 17 18   ' < > '25 21 20  ' ALT '13 12 13   ' < > '23 18 17  ' ALKPHOS 70 70 65   < > 68 61 66  BILITOT 0.6 0.8 0.6   < > 0.6 0.5 0.6   < > = values in this interval not displayed.    RADIOGRAPHIC STUDIES: I have personally reviewed the radiological images as listed and agreed with the findings in the report. No results found.   ASSESSMENT & PLAN:   Cancer of overlapping sites of bladder ([place]) # High-grade transitional cell carcinoma of the bladder metastatic to retroperitoneal lymph node.  Stage IV- CT A/P- MARCH 2022- [facility]-no evidence of any distant metastatic disease in the abdomen pelvis; bladder wall thickening/nodularity concerning for malignancy-Clinically stable.    # Continue to hold Opdivo given patient's overall poor tolerance to therapy; continue surveillance for now.  We will plan imaging in the next 2 to 3 months.  # Right upper hip pain- likley MSK # recommend voltaren gel-apply twice a day to the affected area for the next 10 days.  If not improved call us back we will plan to get scans.  # Local recurrent bladder ca < 1cm [Dr.Stoiof March 2022]- agree with TURBT-Clinically stable.   #CT chest groundglass opacities-less likely infection; suspect hypersensitivity pneumonitis-continue prednisone; taper to 5 mg a day.  We will make again  Referral to  [facility]   # Iatrogenic hypothyroidism-on Synthroid 88 mcg; TSH JAN 2022- WNL. Clinically stable.   # Anemia sec to CKD-III-IV/ on Iv iron.Hb-11-12-Clinically stable. . Hold Venofer today.  # JAN 28th, 2022-incidental 2 mm enhancing focus along the anterior right frontal lobe; June 5 MRI brain-negative for any acute process/benign vascular enhancement.  No further MRI is recommended.    # CKD stageIII-GFR-30-Clinically stable.   # DISPOSITION:   # HOLD  VENOFER # referral to Dr.Aleskerov/ Pulmonary re: abnormal CT chest.  # Follow-up in  57monthMD labs-cbc/cmp/possible venofer; Dr.B     All questions were answered. The patient knows to call the clinic with any problems, questions or concerns.    GCammie Sickle MD 12/16/2020 9:35 PM

## 2020-12-15 NOTE — Assessment & Plan Note (Addendum)
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  Stage IV- CT A/P- MARCH 2022- [facility]-no evidence of any distant metastatic disease in the abdomen pelvis; bladder wall thickening/nodularity concerning for malignancy-Clinically stable.    # Continue to hold Opdivo given patient's overall poor tolerance to therapy; continue surveillance for now.  We will plan imaging in the next 2 to 3 months.  # Right upper hip pain- likley MSK # recommend voltaren gel-apply twice a day to the affected area for the next 10 days.  If not improved call us back we will plan to get scans.  # Local recurrent bladder ca < 1cm [Dr.Stoiof March 2022]- agree with TURBT-Clinically stable.   #CT chest groundglass opacities-less likely infection; suspect hypersensitivity pneumonitis-continue prednisone; taper to 5 mg a day.  We will make again  Referral to [facility]  # Iatrogenic hypothyroidism-on Synthroid 88 mcg; TSH JAN 2022- WNL. Clinically stable.   # Anemia sec to CKD-III-IV/ on Iv iron.Hb-11-12-Clinically stable. . Hold Venofer today.  # JAN 28th, 2022-incidental 2 mm enhancing focus along the anterior right frontal lobe; June 5 MRI brain-negative for any acute process/benign vascular enhancement.  No further MRI is recommended.   # CKD stageIII-GFR-30-Clinically stable.   # DISPOSITION:   # HOLD  VENOFER # referral to Dr.Aleskerov/ Pulmonary re: abnormal CT chest.  # Follow-up in  54month MD labs-cbc/cmp/possible venofer; Dr.B

## 2020-12-15 NOTE — Patient Instructions (Signed)
#   recommend voltaren gel-apply twice a day to the affected area for the next 10 days.  If not improved call us back we will plan to get scans.

## 2020-12-16 ENCOUNTER — Encounter: Payer: Self-pay | Admitting: Internal Medicine

## 2020-12-21 ENCOUNTER — Encounter: Payer: Self-pay | Admitting: Internal Medicine

## 2020-12-21 ENCOUNTER — Ambulatory Visit (INDEPENDENT_AMBULATORY_CARE_PROVIDER_SITE_OTHER): Payer: Medicare HMO | Admitting: Internal Medicine

## 2020-12-21 ENCOUNTER — Other Ambulatory Visit: Payer: Self-pay

## 2020-12-21 VITALS — BP 140/69 | HR 72 | Ht 68.0 in | Wt 126.2 lb

## 2020-12-21 DIAGNOSIS — N1831 Chronic kidney disease, stage 3a: Secondary | ICD-10-CM | POA: Diagnosis not present

## 2020-12-21 DIAGNOSIS — C678 Malignant neoplasm of overlapping sites of bladder: Secondary | ICD-10-CM | POA: Diagnosis not present

## 2020-12-21 DIAGNOSIS — J301 Allergic rhinitis due to pollen: Secondary | ICD-10-CM | POA: Diagnosis not present

## 2020-12-21 DIAGNOSIS — I10 Essential (primary) hypertension: Secondary | ICD-10-CM | POA: Diagnosis not present

## 2020-12-21 DIAGNOSIS — F419 Anxiety disorder, unspecified: Secondary | ICD-10-CM

## 2020-12-21 DIAGNOSIS — R69 Illness, unspecified: Secondary | ICD-10-CM | POA: Diagnosis not present

## 2020-12-21 MED ORDER — LORATADINE 10 MG PO TABS: 10.0000 mg | ORAL_TABLET | Freq: Every day | ORAL | 11 refills | Status: DC

## 2020-12-21 MED ORDER — MOMETASONE FUROATE 50 MCG/ACT NA SUSP: 2.0000 | Freq: Every day | NASAL | 12 refills | Status: DC

## 2020-12-21 NOTE — Assessment & Plan Note (Signed)
?-   Encouraged patient to engage in relaxing activities like yoga, meditation, journaling, going for a walk, or participating in a hobby.  ?- Encouraged patient to reach out to trusted friends or family members about recent struggles, ?Patient was advised to read A book, how to stop worrying and start living, it is good book to read to control  the stress ? ?

## 2020-12-21 NOTE — Progress Notes (Signed)
Established Patient Office Visit  Subjective:  Patient ID: Edward Cummings, male    DOB: [birth year]  Age: 87 y.o. MRN: [id number]  CC:  Chief Complaint  Patient presents with   head cold    Patient complains of running nose and drainage in throat causes him to cough. Patient state that it has been going on for over a month and has been taking allergy pill.     HPI  Edward Cummings presents for allergic rhinitis.  Denies any chest pain shortness of breath trouble breathing.  He does not smoke does not drink.  Past Medical History:  Diagnosis Date   Anemia    Anxiety 12/31/2019   Cancer [facility])    bladder   Chronic kidney disease    Depression    Hypertension    Neuromuscular disorder ([place])    Nerve damage to left face/eye since around 2002.    Past Surgical History:  Procedure Laterality Date   CYSTOSCOPY W/ RETROGRADES Bilateral 01/25/2018   Procedure: CYSTOSCOPY WITH RETROGRADE PYELOGRAM;  [name], MD;  [name];  Service: Urology;  Laterality: Bilateral;   CYSTOSCOPY W/ URETERAL STENT PLACEMENT Bilateral 01/06/2019   Procedure: CYSTOSCOPY WITH RETROGRADE PYELOGRAM/URETERAL STENT REMOVAL;  [name], MD;  [name];  Service: Urology;  Laterality: Bilateral;   CYSTOSCOPY WITH BIOPSY N/A 09/21/2020   Procedure: CYSTOSCOPY WITH BLADDER BIOPSY;  [name], MD;  [name];  Service: Urology;  [name];   CYSTOSCOPY WITH FULGERATION N/A 09/21/2020   Procedure: CYSTOSCOPY WITH FULGERATION;  [name], MD;  [name];  Service: Urology;  [name];   CYSTOSCOPY WITH STENT PLACEMENT Bilateral 01/25/2018   Procedure: CYSTOSCOPY WITH STENT PLACEMENT;  [name], MD;  [name];  Service: Urology;  Laterality: Bilateral;   DORSAL SLIT N/A 01/06/2019   Procedure: DORSAL SLIT;  [name], MD;  [name];  Service: Urology;   [name];   ESOPHAGOGASTRODUODENOSCOPY (EGD) WITH PROPOFOL N/A 11/12/2019   Procedure: ESOPHAGOGASTRODUODENOSCOPY (EGD) WITH PROPOFOL;  Surgeon: Virgel Manifold, MD;  Location: [facility];  Service: Endoscopy;  [name];   EYE SURGERY     Cornea transplants bilaterally & cataract surgery.   TRANSURETHRAL RESECTION OF BLADDER TUMOR N/A 01/25/2018   Procedure: TRANSURETHRAL RESECTION OF BLADDER TUMOR (TURBT);  [name], MD;  [name];  Service: Urology;  [name];    Family History  Problem Relation Age of Onset   Prostate cancer Neg Hx    Kidney cancer Neg Hx    Bladder Cancer Neg Hx     Social History   Socioeconomic History   Marital status: Widowed    Spouse name: [name]   Number of children: Not on file   Years of education: Not on file   Highest education level: Not on file  Occupational History   Not on file  Tobacco Use   Smoking status: Former    Pack years: 0.00    Types: Cigarettes   Smokeless tobacco: Current    Types: Chew   Tobacco comments:    Stopped approximately 10 years ago.  Vaping Use   Vaping Use: Never used  Substance and Sexual Activity   Alcohol use: Not on file    Comment: occasionally   Drug use: Never   Sexual activity: Yes    Birth control/protection: None  Other Topics Concern  Not on file  Social History Narrative    lives in [place]; with son. Wife died in 30-Sep-2018.  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     Social Determinants of Health   Financial Resource Strain: Not on file  Food Insecurity: Not on file  Transportation Needs: Not on file  Physical Activity: Not on file  Stress: Not on file  Social Connections: Not on file  Intimate Partner Violence: Not on file     Current Outpatient Medications:    loratadine (CLARITIN) 10 MG tablet, Take 1 tablet (10 mg total) by mouth daily., Disp: 30 tablet, Rfl: 11   mometasone (NASONEX) 50 MCG/ACT  nasal spray, Place 2 sprays into the nose daily., Disp: 1 each, Rfl: 12   aspirin EC 81 MG tablet, Take 81 mg by mouth daily., Disp: , Rfl:    levothyroxine (SYNTHROID) 88 MCG tablet, TAKE 1 TABLET BY MOUTH ONCE A DAY. TAKE ON AN EMPTY STOMACH WITH A GLASS OF WATER ATLEAST 30-60 MIN BEFORE BREAKFAST, Disp: 90 tablet, Rfl: 3   Multiple Vitamin (MULTIVITAMIN WITH MINERALS) TABS tablet, Take 1 tablet by mouth daily. , Disp: , Rfl:    prednisoLONE acetate (PRED FORTE) 1 % ophthalmic suspension, SMARTSIG:In Eye(s), Disp: , Rfl:    predniSONE (DELTASONE) 5 MG tablet, Take 1 tablet (5 mg total) by mouth daily with breakfast., Disp: 60 tablet, Rfl: 0   predniSONE (DELTASONE) 5 MG tablet, Take 1 tablet (5 mg total) by mouth daily with breakfast., Disp: 60 tablet, Rfl: 0   No Known Allergies  ROS Review of Systems  Constitutional: Negative.   HENT: Negative.    Eyes: Negative.   Respiratory: Negative.    Cardiovascular: Negative.   Gastrointestinal: Negative.   Endocrine: Negative.   Genitourinary: Negative.   Musculoskeletal: Negative.   Skin: Negative.   Allergic/Immunologic: Negative.   Neurological: Negative.   Hematological: Negative.   Psychiatric/Behavioral: Negative.    All other systems reviewed and are negative.    Objective:    Physical Exam Vitals reviewed.  Constitutional:      Appearance: Normal appearance.  HENT:     Mouth/Throat:     Mouth: Mucous membranes are moist.  Eyes:     Pupils: Pupils are equal, round, and reactive to light.  Neck:     Vascular: No carotid bruit.  Cardiovascular:     Rate and Rhythm: Normal rate and regular rhythm.     Pulses: Normal pulses.     Heart sounds: Normal heart sounds.  Pulmonary:     Effort: Pulmonary effort is normal.     Breath sounds: Normal breath sounds.  Abdominal:     General: Bowel sounds are normal.     Palpations: Abdomen is soft. There is no hepatomegaly, splenomegaly or mass.     Tenderness: There is no  abdominal tenderness.     Hernia: No hernia is present.  Musculoskeletal:     Cervical back: Neck supple.     Right lower leg: No edema.     Left lower leg: No edema.  Skin:    Findings: No rash.  Neurological:     Mental Status: He is alert and oriented to person, place, and time.     Motor: No weakness.  Psychiatric:        Mood and Affect: Mood normal.        Behavior: Behavior normal.    BP 140/69   Pulse 72   Ht 5'  8" (1.727 m)   Wt 126 lb 3.2 oz (57.2 kg)   BMI 19.19 kg/m  Wt Readings from Last 3 Encounters:  12/21/20 126 lb 3.2 oz (57.2 kg)  12/15/20 126 lb 3.2 oz (57.2 kg)  11/22/20 123 lb 8 oz (56 kg)     Health Maintenance Due  Topic Date Due   COVID-19 Vaccine (1) Never done    There are no preventive care reminders to display for this patient.  Lab Results  Component Value Date   TSH 1.695 07/01/2020   Lab Results  Component Value Date   WBC 11.3 (H) 12/15/2020   HGB 11.6 (L) 12/15/2020   HCT 34.7 (L) 12/15/2020   MCV 100.6 (H) 12/15/2020   PLT 324 12/15/2020   Lab Results  Component Value Date   NA 138 12/15/2020   K 4.4 12/15/2020   CO2 25 12/15/2020   GLUCOSE 104 (H) 12/15/2020   BUN 33 (H) 12/15/2020   CREATININE 2.02 (H) 12/15/2020   BILITOT 0.6 12/15/2020   ALKPHOS 66 12/15/2020   AST 20 12/15/2020   ALT 17 12/15/2020   PROT 6.7 12/15/2020   ALBUMIN 3.5 12/15/2020   CALCIUM 9.2 12/15/2020   ANIONGAP 6 12/15/2020   No results found for: CHOL No results found for: HDL No results found for: LDLCALC No results found for: TRIG No results found for: CHOLHDL Lab Results  Component Value Date   HGBA1C 4.5 (L) 04/13/2018      Assessment & Plan:   Problem List Items Addressed This Visit       Cardiovascular and Mediastinum   Hypertension - Primary     Patient denies any chest pain or shortness of breath there is no history of palpitation or paroxysmal nocturnal dyspnea   patient was advised to follow low-salt low-cholesterol  diet    ideally I want to keep systolic blood pressure below 130 mmHg, patient was asked to check blood pressure one times a week and give me a report on that.  Patient will be follow-up in 3 months  or earlier as needed, patient will call me back for any change in the cardiovascular symptoms Patient was advised to buy a book from local bookstore concerning blood pressure and read several chapters  every day.  This will be supplemented by some of the material we will give him from the office.  Patient should also utilize other resources like YouTube and Internet to learn more about the blood pressure and the diet.       Relevant Medications   loratadine (CLARITIN) 10 MG tablet   mometasone (NASONEX) 50 MCG/ACT nasal spray     Respiratory   Seasonal allergic rhinitis due to pollen    Take Claritin 10 mg daily         Genitourinary   Cancer of overlapping sites of bladder [facility])    Patient cancer is under control.       Stage 3a chronic kidney disease ([facility])    Renal function was stable at the present time         Other   Anxiety    -.  - Encouraged patient to engage in relaxing activities like yoga, meditation, journaling, going for a walk, or participating in a hobby.  - Encouraged patient to reach out to trusted friends or family members about recent struggles, Patient was advised to read A book, how to stop worrying and start living, it is good book to read to control  the stress  Meds ordered this encounter  Medications   loratadine (CLARITIN) 10 MG tablet    Sig: Take 1 tablet (10 mg total) by mouth daily.    Dispense:  30 tablet    Refill:  11   mometasone (NASONEX) 50 MCG/ACT nasal spray    Sig: Place 2 sprays into the nose daily.    Dispense:  1 each    Refill:  12    Follow-up: No follow-ups on file.    [name], MD

## 2020-12-21 NOTE — Assessment & Plan Note (Signed)
Renal function was stable at the present time

## 2020-12-21 NOTE — Assessment & Plan Note (Signed)
Take Claritin 10 mg daily

## 2020-12-21 NOTE — Assessment & Plan Note (Signed)
Patient cancer is under control.

## 2020-12-21 NOTE — Assessment & Plan Note (Signed)

## 2020-12-23 ENCOUNTER — Encounter: Payer: Medicare HMO | Admitting: Family Medicine

## 2020-12-28 ENCOUNTER — Other Ambulatory Visit: Payer: Self-pay | Admitting: *Deleted

## 2020-12-28 ENCOUNTER — Encounter: Payer: Self-pay | Admitting: *Deleted

## 2020-12-28 NOTE — Patient Outreach (Signed)
[place] [facility]) Care Management  [place]  12/28/2020   Edward Cummings 12/25/1932 [id number]  Subjective: Initial telephone outreach for [place] referral for [place] Management servcies. Called listed home number and a male answered and advised I had the wrong number. Called pt son listed, Edward Cummings, and left a message asking him to return my call. He returned my call gave me a report on his perception of how his father is doing, which was quite good . Asked if he would call his father and ask him to answer my call, which he did and Pt gave consent for the services. NP was able to do his initial assessment.   Pt has health hx. Of hypothyroidism, bladder ca stage IV, wt loss, low wt (BMI 18.46 - 125#), PND, grief reaction.  Encounter Medications:  Outpatient Encounter Medications as of 12/28/2020  Medication Sig   aspirin EC 81 MG tablet Take 81 mg by mouth daily.   levothyroxine (SYNTHROID) 88 MCG tablet TAKE 1 TABLET BY MOUTH ONCE A DAY. TAKE ON AN EMPTY STOMACH WITH A GLASS OF WATER ATLEAST 30-60 MIN BEFORE BREAKFAST   loratadine (CLARITIN) 10 MG tablet Take 1 tablet (10 mg total) by mouth daily.   mometasone (NASONEX) 50 MCG/ACT nasal spray Place 2 sprays into the nose daily.   Multiple Vitamin (MULTIVITAMIN WITH MINERALS) TABS tablet Take 1 tablet by mouth daily.    prednisoLONE acetate (PRED FORTE) 1 % ophthalmic suspension SMARTSIG:In Eye(s)   predniSONE (DELTASONE) 5 MG tablet Take 1 tablet (5 mg total) by mouth daily with breakfast.   predniSONE (DELTASONE) 5 MG tablet Take 1 tablet (5 mg total) by mouth daily with breakfast.   No facility-administered encounter medications on file as of 12/28/2020.    Functional Status:  In your present state of health, do you have any difficulty performing the following activities: 12/28/2020 09/16/2020  Hearing? N N  Vision? N N  Difficulty concentrating or making decisions? N N  Walking or climbing stairs? N N   Dressing or bathing? N N  Doing errands, shopping? N N  Preparing Food and eating ? N -  Using the Toilet? N -  In the past six months, have you accidently leaked urine? N -  Do you have problems with loss of bowel control? N -  Managing your Medications? N -  Managing your Finances? N -  Housekeeping or managing your Housekeeping? N -  Some recent data might be hidden    Fall/Depression Screening: Fall Risk  12/28/2020 05/24/2020 06/09/2019  Falls in the past year? 0 0 0  Number falls in past yr: 0 0 -  Injury with Fall? 0 0 -  Risk for fall due to : Other (Comment) - -  Risk for fall due to: Comment No hx of falls, however is elderly and somewhat frail. - -  Follow up Falls evaluation completed - -   PHQ 2/9 Scores 12/28/2020 05/24/2020  PHQ - 2 Score 0 0    Assessment: Low weight                         Bladder Ca Stage IV.  Care Plan   Goals Addressed             This Visit's Progress    Eat Healthy as evidenced by wt gain over the next 3 months.       Timeframe:  Long-Range Goal Priority:  [employer]  Date:    12/29/18                         Expected End Date:     04/10/21                  Follow Up Date 02/08/21 Barriers: Health Behaviors     - set goal weight - drink 6 to 8 glasses of water each day    Why is this important?   When you are ready to manage your nutrition or weight, having a plan and setting goals will help.  Taking small steps to change how you eat and exercise is a good place to start.    Notes: 12/28/20 Pt doesn't have any dietary restrictions, he eats what he wants. He had a considerable wt loss last year and is now gaining back current wt is 125#, Ht is 5'9" and his BMI is 18.46. Would like to see pt gain an additional 5 pounds to 130# over the next 3 months.He is drinking a nutritional supplement occasionally. Encourage him to drink one daily and a balanced diet.     Will attend appointments with primary care and specialty MDs as  scheduled over the next 3 months.       Start date 12/28/20 High priority Long term goal Expected end date 04/11/21 Barriers: Health Behaviors  Notes. 7/19/11Pt has a good record of attending appts. He can drive and his family supervising him and keeps up with his medical care. He has a PE on 12/30/20 and follow up with oncology for chemotherapy in August.        Plan: Will report to assigned CM for Chronic disease mgmt level III. Follow-up: Patient agrees to Care Plan and Follow-up. Follow-up in 2 week(s)  Kayleen Memos C. Myrtie Neither, MSN, [facility] Gerontological Nurse Practitioner [facility] Care Management [phone]

## 2020-12-30 ENCOUNTER — Other Ambulatory Visit: Payer: Self-pay

## 2020-12-30 ENCOUNTER — Ambulatory Visit (INDEPENDENT_AMBULATORY_CARE_PROVIDER_SITE_OTHER): Payer: Medicare HMO | Admitting: Family Medicine

## 2020-12-30 ENCOUNTER — Encounter: Payer: Self-pay | Admitting: Family Medicine

## 2020-12-30 VITALS — BP 163/77 | HR 72 | Ht 68.0 in | Wt 125.6 lb

## 2020-12-30 DIAGNOSIS — Z0000 Encounter for general adult medical examination without abnormal findings: Secondary | ICD-10-CM

## 2020-12-30 DIAGNOSIS — N1831 Chronic kidney disease, stage 3a: Secondary | ICD-10-CM | POA: Diagnosis not present

## 2020-12-30 NOTE — Progress Notes (Signed)
Established Patient Office Visit  SUBJECTIVE:  Subjective  Patient ID: Edward Cummings, male    DOB: [birth year]  Age: 87 y.o. MRN: [id number]  CC:  Chief Complaint  Patient presents with   Annual Exam    HPI Edward Cummings is a 87 y.o. male presenting today for    Past Medical History:  Diagnosis Date   Anemia    Anxiety 12/31/2019   Cancer [facility])    bladder   Chronic kidney disease    Depression    Hypertension    Neuromuscular disorder ([place])    Nerve damage to left face/eye since around Sep 18, 2000.    Past Surgical History:  Procedure Laterality Date   CYSTOSCOPY W/ RETROGRADES Bilateral 01/25/2018   Procedure: CYSTOSCOPY WITH RETROGRADE PYELOGRAM;  [name], MD;  [name];  Service: Urology;  Laterality: Bilateral;   CYSTOSCOPY W/ URETERAL STENT PLACEMENT Bilateral 01/06/2019   Procedure: CYSTOSCOPY WITH RETROGRADE PYELOGRAM/URETERAL STENT REMOVAL;  [name], MD;  [name];  Service: Urology;  Laterality: Bilateral;   CYSTOSCOPY WITH BIOPSY N/A 09/21/2020   Procedure: CYSTOSCOPY WITH BLADDER BIOPSY;  [name], MD;  [name];  Service: Urology;  [name];   CYSTOSCOPY WITH FULGERATION N/A 09/21/2020   Procedure: CYSTOSCOPY WITH FULGERATION;  [name], MD;  [name];  Service: Urology;  [name];   CYSTOSCOPY WITH STENT PLACEMENT Bilateral 01/25/2018   Procedure: CYSTOSCOPY WITH STENT PLACEMENT;  [name], MD;  [name];  Service: Urology;  Laterality: Bilateral;   DORSAL SLIT N/A 01/06/2019   Procedure: DORSAL SLIT;  [name], MD;  [name];  Service: Urology;  [name];   ESOPHAGOGASTRODUODENOSCOPY (EGD) WITH PROPOFOL N/A 11/12/2019   Procedure: ESOPHAGOGASTRODUODENOSCOPY (EGD) WITH PROPOFOL;  Surgeon: Virgel Manifold, MD;  Location: [facility] ENDOSCOPY;  Service: Endoscopy;  [name];   EYE  SURGERY     Cornea transplants bilaterally & cataract surgery.   TRANSURETHRAL RESECTION OF BLADDER TUMOR N/A 01/25/2018   Procedure: TRANSURETHRAL RESECTION OF BLADDER TUMOR (TURBT);  [name], MD;  [name];  Service: Urology;  [name];    Family History  Problem Relation Age of Onset   Prostate cancer Neg Hx    Kidney cancer Neg Hx    Bladder Cancer Neg Hx     Social History   Socioeconomic History   Marital status: Widowed    Spouse name: [name]   Number of children: 2   Years of education: 6th grade   Highest education level: 6th grade  Occupational History   Not on file  Tobacco Use   Smoking status: Former    Types: Cigarettes   Smokeless tobacco: Current    Types: Chew   Tobacco comments:    Stopped approximately 10 years ago.  Vaping Use   Vaping Use: Never used  Substance and Sexual Activity   Alcohol use: Yes    Alcohol/week: 2.0 standard drinks    Types: 2 Cans of beer per week    Comment: occasionally   Drug use: Never   Sexual activity: Yes    Birth control/protection: None  Other Topics Concern   Not on file  Social History Narrative    lives in [place]; with son. Wife died in 19-Sep-2018.  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 Pt still drives and is indepent  with no serious chronic disease.   Social Determinants of Health   Financial Resource Strain: Not on file  Food Insecurity: Not on file  Transportation Needs: Not on file  Physical Activity: Not on file  Stress: Not on file  Social Connections: Not on file  Intimate Partner Violence: Not on file     Current Outpatient Medications:    aspirin EC 81 MG tablet, Take 81 mg by mouth daily., Disp: , Rfl:    levothyroxine (SYNTHROID) 88 MCG tablet, TAKE 1 TABLET BY MOUTH ONCE A DAY. TAKE ON AN EMPTY STOMACH WITH A GLASS OF WATER ATLEAST 30-60 MIN BEFORE BREAKFAST, Disp: 90 tablet, Rfl: 3   loratadine  (CLARITIN) 10 MG tablet, Take 1 tablet (10 mg total) by mouth daily., Disp: 30 tablet, Rfl: 11   mometasone (NASONEX) 50 MCG/ACT nasal spray, Place 2 sprays into the nose daily., Disp: 1 each, Rfl: 12   Multiple Vitamin (MULTIVITAMIN WITH MINERALS) TABS tablet, Take 1 tablet by mouth daily. , Disp: , Rfl:    prednisoLONE acetate (PRED FORTE) 1 % ophthalmic suspension, SMARTSIG:In Eye(s), Disp: , Rfl:    predniSONE (DELTASONE) 5 MG tablet, Take 1 tablet (5 mg total) by mouth daily with breakfast., Disp: 60 tablet, Rfl: 0   predniSONE (DELTASONE) 5 MG tablet, Take 1 tablet (5 mg total) by mouth daily with breakfast., Disp: 60 tablet, Rfl: 0   No Known Allergies  ROS Review of Systems  Constitutional: Negative.   HENT: Negative.    Respiratory: Negative.    Gastrointestinal: Negative.   Genitourinary: Negative.   Neurological: Negative.   Psychiatric/Behavioral: Negative.      OBJECTIVE:    Physical Exam HENT:     Head: Normocephalic.     Right Ear: Tympanic membrane normal.     Left Ear: Tympanic membrane normal.  Cardiovascular:     Rate and Rhythm: Normal rate and regular rhythm.  Musculoskeletal:        General: Normal range of motion.     Cervical back: Normal range of motion.  Neurological:     General: No focal deficit present.     Mental Status: He is alert.  Psychiatric:        Mood and Affect: Mood normal.    BP (!) 163/77   Pulse 72   Ht 5\' 8"  (1.727 m)   Wt 125 lb 9.6 oz (57 kg)   BMI 19.10 kg/m  Wt Readings from Last 3 Encounters:  12/30/20 125 lb 9.6 oz (57 kg)  12/21/20 126 lb 3.2 oz (57.2 kg)  12/15/20 126 lb 3.2 oz (57.2 kg)    Health Maintenance Due  Topic Date Due   COVID-19 Vaccine (4 - Booster for Pfizer series) 06/08/2020    There are no preventive care reminders to display for this patient.  CBC Latest Ref Rng & Units 12/15/2020 11/16/2020 10/11/2020  WBC 4.0 - 10.5 K/uL 11.3(H) 12.5(H) 13.4(H)  Hemoglobin 13.0 - 17.0 g/dL 11.6(L) 11.3(L)  11.6(L)  Hematocrit 39.0 - 52.0 % 34.7(L) 34.0(L) 34.9(L)  Platelets 150 - 400 K/uL 324 255 314   CMP Latest Ref Rng & Units 12/15/2020 11/16/2020 10/11/2020  Glucose 70 - 99 mg/dL 104(H) 73 103(H)  BUN 8 - 23 mg/dL 33(H) 35(H) 30(H)  Creatinine 0.61 - 1.24 mg/dL 2.02(H) 2.11(H) 1.92(H)  Sodium 135 - 145 mmol/L 138 137 138  Potassium 3.5 - 5.1 mmol/L 4.4 3.9 5.0  Chloride 98 - 111 mmol/L 107 105 103  CO2 22 - 32  mmol/L 25 23 25   Calcium 8.9 - 10.3 mg/dL 9.2 9.0 9.5  Total Protein 6.5 - 8.1 g/dL 6.7 6.6 7.0  Total Bilirubin 0.3 - 1.2 mg/dL 0.6 0.5 0.6  Alkaline Phos 38 - 126 U/L 66 61 68  AST 15 - 41 U/L 20 21 25   ALT 0 - 44 U/L 17 18 23     Lab Results  Component Value Date   TSH 1.695 07/01/2020   Lab Results  Component Value Date   ALBUMIN 3.5 12/15/2020   ANIONGAP 6 12/15/2020   No results found for: CHOL, HDL, LDLCALC, CHOLHDL No results found for: TRIG Lab Results  Component Value Date   HGBA1C 4.5 (L) 04/13/2018      ASSESSMENT & PLAN:   Problem List Items Addressed This Visit       Genitourinary   Stage 3a chronic kidney disease (HCC)    GFR stable at 32, BP has been stable. FU with Nephrology as planned.          Other   Annual physical exam - Primary    Colon Screening- NA PSA- NA TDAP- NA Shingles Vaccine- Complete Flu Vaccine- Complete Pneumonia Vaccine- Complete  Patient doing well and has recently been given the bladder cancer free news, he is pain free and continues to fu with nephrology.         No orders of the defined types were placed in this encounter.     Follow-up: No follow-ups on file.    Beckie Salts, [place] [address] [place], [place] [address]

## 2020-12-30 NOTE — Assessment & Plan Note (Signed)
Colon Screening- NA PSA- NA TDAP- NA Shingles Vaccine- Complete Flu Vaccine- Complete Pneumonia Vaccine- Complete  Patient doing well and has recently been given the bladder cancer free news, he is pain free and continues to fu with nephrology.

## 2020-12-30 NOTE — Assessment & Plan Note (Addendum)
GFR stable at 32, BP has been stable. FU with Nephrology as planned.

## 2021-01-12 ENCOUNTER — Encounter: Payer: Self-pay | Admitting: *Deleted

## 2021-01-12 ENCOUNTER — Other Ambulatory Visit: Payer: Self-pay | Admitting: *Deleted

## 2021-01-12 ENCOUNTER — Other Ambulatory Visit: Payer: Self-pay

## 2021-01-12 NOTE — Patient Outreach (Signed)
[place] [facility]) Care Management  01/12/2021  Edward Cummings 14-Mar-1933 [id number]  [facility] outreach follow up with complex care patient  Edward Edward Cummings as re assigned to this RN CM on 12/08/20 after referred to [facility] on 11/26/20 for complex care services/ [employer] initiative. Edward Cummings was noted with 1 ED visit/COPD/HTN   Edward Cummings is able to verify HIPAA identifiers   Follow up assessment With follow up after his initial assessment, Edward Cummings confirms he remains active. He reports his appetite remains good and he is eating 3 times a day He notes an improvement in his weight of today at 126 lbs (had been down to 120 lbs)   Good support from his youngest son and grandson who each call daily. He lives with his oldest son , Edward Cummings. He is a  widow (wife passed 1 yr ago)   Preventive care assessment Corneal transplant bilateral eyes history  His son administers his eye drops His last eye appointment was in 2021. He reports he does not need glasses  Dental no teeth,  no dentures and does not prefer dentures  Hypertension (HTN) Assessed for any worsening symptoms He denies headaches and chest pain but reports some shortness of breath (sob) when he is in heat   Chronic Kidney disease (CKD)  Completes all appointments   Put his appointments on his phone      Patient Active Problem List   Diagnosis Date Noted   Rhinorrhea 10/14/2020   Hernia, inguinal, left 08/25/2020   Stage 3a chronic kidney disease ([place]) 05/24/2020   Cervicogenic headache 05/24/2020   Tobacco abuse counseling 04/05/2020   Urothelial carcinoma of bladder ([place]) 02/25/2020   Cerumen debris on tympanic membrane of both ears 12/31/2019   Seasonal allergic rhinitis due to pollen 12/31/2019   Anxiety 12/31/2019   Annual physical exam 07/25/2018   Syncope 04/13/2018   Cancer of overlapping sites of bladder ([place]) 02/04/2018   Anemia due to stage 3 chronic kidney disease ([place]) 02/04/2018   AKI (acute kidney  injury) ([place]) 12/12/2017   Hypertension 11/19/2013   Fuchs' corneal dystrophy 10/10/2013   Current Outpatient Medications on File Prior to Visit  Medication Sig Dispense Refill   aspirin EC 81 MG tablet Take 81 mg by mouth daily.     levothyroxine (SYNTHROID) 88 MCG tablet TAKE 1 TABLET BY MOUTH ONCE A DAY. TAKE ON AN EMPTY STOMACH WITH A GLASS OF WATER ATLEAST 30-60 MIN BEFORE BREAKFAST 90 tablet 3   loratadine (CLARITIN) 10 MG tablet Take 1 tablet (10 mg total) by mouth daily. 30 tablet 11   mometasone (NASONEX) 50 MCG/ACT nasal spray Place 2 sprays into the nose daily. 1 each 12   Multiple Vitamin (MULTIVITAMIN WITH MINERALS) TABS tablet Take 1 tablet by mouth daily.      prednisoLONE acetate (PRED FORTE) 1 % ophthalmic suspension SMARTSIG:In Eye(s)     predniSONE (DELTASONE) 5 MG tablet Take 1 tablet (5 mg total) by mouth daily with breakfast. 60 tablet 0   predniSONE (DELTASONE) 5 MG tablet Take 1 tablet (5 mg total) by mouth daily with breakfast. 60 tablet 0   No current facility-administered medications on file prior to visit.     Plan Patient agrees to care plan and follow up within the next 30 business days  Goals Addressed               This Visit's Progress     Patient Stated     Eat Healthy as  evidenced by wt gain over the next 3 months. [facility]) (pt-stated)   On track     Timeframe:  Long-Range Goal Priority:  High Start Date:    12/29/18                         Expected End Date:     04/10/21                  Follow Up Date 02/15/21 Barriers: Health Behaviors     - set goal weight - drink 6 to 8 glasses of water each day     Notes:  01/12/21 He reports his appetite remains good and he is eating 3 times a day He notes an improvement in his weight of today at 126 lbs (had been down to 120 lbs) 12/28/20 Pt doesn't have any dietary restrictions, he eats what he wants. He had a considerable wt loss last year and is now gaining back current wt is 125#, Ht is 5'9" and  his BMI is 18.46. Would like to see pt gain an additional 5 pounds to 130# over the next 3 months.He is drinking a nutritional supplement occasionally. Encourage him to drink one daily and a balanced diet.      Track and Manage My Blood Pressure-Hypertension (THN) (pt-stated)   On track     Timeframe:  Long-Range Goal Priority:  High Start Date:                   01/12/21          Expected End Date:       05/11/21                Follow Up Date 02/15/21  Barriers: Knowledge    - check blood pressure weekly  Notes:  01/12/21 Assessed for any worsening symptoms He denies headaches and chest pain but reports some shortness of breath (sob) when he is in heat       Will attend appointments with primary care and specialty MDs as scheduled over the next 3 months.(THN) (pt-stated)   On track     Start date 12/28/20 High priority Long term goal Expected end date 04/11/21 Barriers: Health Behaviors  Notes.  01/12/21 Completes all appointments  Put his appointments on his phone 12/28/20 Pt has a good record of attending appts. He can drive and his family supervising him and keeps up with his medical care. He has a PE on 12/30/20 and follow up with oncology for chemotherapy in August.         [place]. Lavina Hamman, RN, BSN, [place] Coordinator Office number [phone] [facility] number [phone] Fax number [phone]

## 2021-01-14 ENCOUNTER — Other Ambulatory Visit: Payer: Self-pay

## 2021-01-14 ENCOUNTER — Inpatient Hospital Stay: Payer: Medicare HMO | Admitting: Internal Medicine

## 2021-01-14 ENCOUNTER — Inpatient Hospital Stay: Payer: Medicare HMO | Attending: Internal Medicine

## 2021-01-14 ENCOUNTER — Inpatient Hospital Stay: Payer: Medicare HMO

## 2021-01-14 ENCOUNTER — Encounter: Payer: Self-pay | Admitting: Internal Medicine

## 2021-01-14 VITALS — BP 144/66 | HR 61 | Temp 97.1°F | Resp 16 | Ht 68.0 in | Wt 129.0 lb

## 2021-01-14 DIAGNOSIS — Z87891 Personal history of nicotine dependence: Secondary | ICD-10-CM | POA: Diagnosis not present

## 2021-01-14 DIAGNOSIS — Z7951 Long term (current) use of inhaled steroids: Secondary | ICD-10-CM | POA: Diagnosis not present

## 2021-01-14 DIAGNOSIS — D631 Anemia in chronic kidney disease: Secondary | ICD-10-CM | POA: Diagnosis not present

## 2021-01-14 DIAGNOSIS — C772 Secondary and unspecified malignant neoplasm of intra-abdominal lymph nodes: Secondary | ICD-10-CM | POA: Insufficient documentation

## 2021-01-14 DIAGNOSIS — N183 Chronic kidney disease, stage 3 unspecified: Secondary | ICD-10-CM | POA: Diagnosis not present

## 2021-01-14 DIAGNOSIS — Z7982 Long term (current) use of aspirin: Secondary | ICD-10-CM | POA: Insufficient documentation

## 2021-01-14 DIAGNOSIS — Z7952 Long term (current) use of systemic steroids: Secondary | ICD-10-CM | POA: Insufficient documentation

## 2021-01-14 DIAGNOSIS — C678 Malignant neoplasm of overlapping sites of bladder: Secondary | ICD-10-CM | POA: Diagnosis not present

## 2021-01-14 DIAGNOSIS — Z79899 Other long term (current) drug therapy: Secondary | ICD-10-CM | POA: Insufficient documentation

## 2021-01-14 DIAGNOSIS — I129 Hypertensive chronic kidney disease with stage 1 through stage 4 chronic kidney disease, or unspecified chronic kidney disease: Secondary | ICD-10-CM | POA: Insufficient documentation

## 2021-01-14 LAB — COMPREHENSIVE METABOLIC PANEL
ALT: 17 U/L (ref 0–44)
AST: 20 U/L (ref 15–41)
Albumin: 3.9 g/dL (ref 3.5–5.0)
Alkaline Phosphatase: 63 U/L (ref 38–126)
Anion gap: 6 (ref 5–15)
BUN: 43 mg/dL — ABNORMAL HIGH (ref 8–23)
CO2: 23 mmol/L (ref 22–32)
Calcium: 9.1 mg/dL (ref 8.9–10.3)
Chloride: 107 mmol/L (ref 98–111)
Creatinine, Ser: 2.31 mg/dL — ABNORMAL HIGH (ref 0.61–1.24)
GFR, Estimated: 27 mL/min — ABNORMAL LOW (ref >60)
Glucose, Bld: 92 mg/dL (ref 70–99)
Potassium: 4.7 mmol/L (ref 3.5–5.1)
Sodium: 136 mmol/L (ref 135–145)
Total Bilirubin: 0.6 mg/dL (ref 0.3–1.2)
Total Protein: 7.1 g/dL (ref 6.5–8.1)

## 2021-01-14 LAB — CBC WITH DIFFERENTIAL/PLATELET
Abs Immature Granulocytes: 0.05 10*3/uL (ref 0.00–0.07)
Basophils Absolute: 0.1 10*3/uL (ref 0.0–0.1)
Basophils Relative: 1 %
Eosinophils Absolute: 0.8 10*3/uL — ABNORMAL HIGH (ref 0.0–0.5)
Eosinophils Relative: 8 %
HCT: 34.2 % — ABNORMAL LOW (ref 39.0–52.0)
Hemoglobin: 11.3 g/dL — ABNORMAL LOW (ref 13.0–17.0)
Immature Granulocytes: 1 %
Lymphocytes Relative: 13 %
Lymphs Abs: 1.3 10*3/uL (ref 0.7–4.0)
MCH: 33.6 pg (ref 26.0–34.0)
MCHC: 33 g/dL (ref 30.0–36.0)
MCV: 101.8 fL — ABNORMAL HIGH (ref 80.0–100.0)
Monocytes Absolute: 0.5 10*3/uL (ref 0.1–1.0)
Monocytes Relative: 5 %
Neutro Abs: 7.8 10*3/uL — ABNORMAL HIGH (ref 1.7–7.7)
Neutrophils Relative %: 72 %
Platelets: 235 10*3/uL (ref 150–400)
RBC: 3.36 MIL/uL — ABNORMAL LOW (ref 4.22–5.81)
RDW: 13 % (ref 11.5–15.5)
WBC: 10.6 10*3/uL — ABNORMAL HIGH (ref 4.0–10.5)
nRBC: 0 % (ref 0.0–0.2)

## 2021-01-14 NOTE — Assessment & Plan Note (Addendum)
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  Stage IV- CT A/P- MARCH 2022- [facility]-no evidence of any distant metastatic disease in the abdomen pelvis; bladder wall thickening/nodularity concerning for malignancy-Clinically stable.    # Continue to hold Opdivo given patient's overall poor tolerance to therapy; continue surveillance for now.  We will plan imaging in the next month; ordered scan today.   # Local recurrent bladder ca < 1cm [Dr.Stoiof March 2022]- agree with TURBT- STABLE.   #CT chest groundglass opacities-less likely infection; suspect hypersensitivity pneumonitis-continue prednisone at  5 mg a day.  Again reviewed the CT scan imaging with the son.  We will make again #3 Referral to [facility]  # Iatrogenic hypothyroidism-on Synthroid 88 mcg; TSH JAN 2022- WNL- STABLE.    # Anemia sec to CKD-III-IV/ on Iv iron.Hb-11-12-Clinically- STABLE; Hold Venofer today.  # CKD stageIII-GFR- 28-30-Clinically  STABLE.    #Weight loss improving; continue prednisone for appetite/also possible hypersensitivity pneumonitis.  Check TSH next visit  # DISPOSITION:  # HOLD venofer  # Follow-up in  38month MD labs-cbc/cmp/possible venofer; CT scan C/A/P-; Dr.B

## 2021-01-14 NOTE — Progress Notes (Signed)
Pulmonary referral nobody has reached out to him.

## 2021-01-14 NOTE — Patient Instructions (Signed)
Pulmonary # [phone]

## 2021-01-14 NOTE — Progress Notes (Signed)
[place] NOTE  Patient Care Team: [name], [name], [name], MD as Consulting Physician (Gastroenterology) Clyde Canterbury, MD as Referring Physician (Otolaryngology) Ottie Glazier, MD as Consulting Physician (Pulmonary Disease) Barbaraann Faster, RN as [facility]  CHIEF COMPLAINTS/PURPOSE OF CONSULTATION: Bladder cancer   Oncology History Overview Note  # AUG 2019-TRANSITIONAL CELL BLADDER CA [~ 4cm tumor] s/p cystoscopy [Dr.Stoiff]  with extensive angiolymphatic invasion; [name]; [name]; mediastinal uptake; right pubic rami uptake.  # [name]; Jan 18th 2021- switched to [facility]- [pt preference; [name]; July 2020 cystoscopy-no evidence of bladder malignancy/Dr. Bernardo Heater- enlarged prostate [PSA- 0.95; 2021]  # Molecular testing- PDL-1 CPS- 20%; [name]  ;GOALS: palliative  CURRENT/MOST RECENT THERAPY:OPDIVO [C]     Cancer of overlapping sites of bladder ([place])  02/28/2018 - 06/09/2019 Chemotherapy   The patient had atezolizumab (TECENTRIQ) 1,[name], 1,200 mg, Intravenous, Once, [name],200 mg (02/28/2018), 1,200 mg (03/21/2018), 1,200 mg (04/11/2018), 1,200 mg (05/02/2018), 1,200 mg (06/13/2018), 1,200 mg (05/23/2018), 1,200 mg (07/04/2018), 1,200 mg (07/25/2018), 1,200 mg (08/15/2018), 1,200 mg (09/05/2018), 1,200 mg (10/25/2018), 1,200 mg (11/22/2018), 1,200 mg (12/20/2018), 1,200 mg (01/10/2019), 1,200 mg (01/31/2019), 1,200 mg (02/21/2019), 1,200 mg  (03/14/2019), 1,200 mg (04/04/2019), 1,200 mg (04/25/2019), 1,200 mg (05/16/2019), 1,200 mg (06/09/2019)   for chemotherapy treatment.     06/30/2019 -  Chemotherapy   The patient had nivolumab (OPDIVO) 240 mg in sodium chloride 0.9 % 100 mL chemo infusion, 240 mg, Intravenous, Once, 22 of 23 cycles Administration: 240 mg (06/30/2019), 240 mg (07/14/2019), 240 mg (07/28/2019), 240 mg (08/11/2019), 240 mg (08/25/2019), 240 mg (09/08/2019), 240 mg (09/22/2019), 240 mg (11/03/2019), 240 mg (11/17/2019), 240 mg (12/10/2019), 240 mg (12/24/2019), 240 mg (01/08/2020), 240 mg (01/22/2020), 240 mg (02/05/2020), 240 mg (02/19/2020), 240 mg (03/04/2020), 240 mg (03/18/2020), 240 mg (04/01/2020), 240 mg (04/29/2020), 240 mg (05/13/2020), 240 mg (05/27/2020), 240 mg (06/17/2020)   for chemotherapy treatment.      HISTORY OF PRESENTING ILLNESS: KHAMARION BJELLAND 87 y.o.  male with metastatic transitional carcinoma of the bladder most recently on Opdivo [currently on hold because of poor tolerance] is here for follow-up.  Patient denies any worsening shortness of breath or cough.  Denies any nausea vomiting.  He continues well prednisone 5 mg a day.  Appetite is improving.  Has not had appointment with pulmonary yet.  Review of Systems  Constitutional:  Positive for malaise/fatigue. Negative for chills, diaphoresis and fever.  HENT:  Negative for nosebleeds and sore throat.   Eyes:  Negative for double vision.  Respiratory:  Negative for hemoptysis, sputum production and wheezing.   Cardiovascular:  Negative for chest pain, palpitations, orthopnea and leg swelling.  Gastrointestinal:  Negative for abdominal pain, blood in stool, diarrhea, heartburn, melena, nausea and vomiting.  Genitourinary:  Negative for dysuria.  Musculoskeletal:  Positive for back pain and joint pain.  Skin: Negative.  Negative for itching and rash.  Neurological:  Negative for tingling, focal weakness and headaches.  Endo/Heme/Allergies:  Does not  bruise/bleed  easily.  Psychiatric/Behavioral:  Negative for depression. The patient is not nervous/anxious and does not have insomnia.     MEDICAL HISTORY:  Past Medical History:  Diagnosis Date  . Anemia   . Anxiety 12/31/2019  . Cancer ([place])    bladder  . Chronic kidney disease   . Depression   . Hypertension   . Neuromuscular disorder ([place])    Nerve damage to left face/eye since around 08/22/2000.    SURGICAL HISTORY: Past Surgical History:  Procedure Laterality Date  . CYSTOSCOPY W/ RETROGRADES Bilateral 01/25/2018   Procedure: CYSTOSCOPY WITH RETROGRADE PYELOGRAM;  [name], MD;  [name];  Service: Urology;  Laterality: Bilateral;  . CYSTOSCOPY W/ URETERAL STENT PLACEMENT Bilateral 01/06/2019   Procedure: CYSTOSCOPY WITH RETROGRADE PYELOGRAM/URETERAL STENT REMOVAL;  [name], MD;  [name];  Service: Urology;  Laterality: Bilateral;  . CYSTOSCOPY WITH BIOPSY N/A 09/21/2020   Procedure: CYSTOSCOPY WITH BLADDER BIOPSY;  [name], MD;  [name];  Service: Urology;  [name];  . CYSTOSCOPY WITH FULGERATION N/A 09/21/2020   Procedure: CYSTOSCOPY WITH FULGERATION;  [name], MD;  [name];  Service: Urology;  [name];  . CYSTOSCOPY WITH STENT PLACEMENT Bilateral 01/25/2018   Procedure: CYSTOSCOPY WITH STENT PLACEMENT;  [name], MD;  [name];  Service: Urology;  Laterality: Bilateral;  . DORSAL SLIT N/A 01/06/2019   Procedure: DORSAL SLIT;  [name], MD;  [name];  Service: Urology;  [name];  . ESOPHAGOGASTRODUODENOSCOPY (EGD) WITH PROPOFOL N/A 11/12/2019   Procedure: ESOPHAGOGASTRODUODENOSCOPY (EGD) WITH PROPOFOL;  Surgeon: Virgel Manifold, MD;  Location: [facility];  Service: Endoscopy;  [name];  . EYE SURGERY     Cornea transplants bilaterally & cataract surgery.  . TRANSURETHRAL RESECTION  OF BLADDER TUMOR N/A 01/25/2018   Procedure: TRANSURETHRAL RESECTION OF BLADDER TUMOR (TURBT);  [name], MD;  [name];  Service: Urology;  [name];    SOCIAL HISTORY: Social History   Socioeconomic History  . Marital status: Widowed    Spouse name: [name]  . Number of children: 2  . Years of education: 6th grade  . Highest education level: 6th grade  Occupational History  . Not on file  Tobacco Use  . Smoking status: Former    Types: Cigarettes  . Smokeless tobacco: Current    Types: Chew  . Tobacco comments:    Stopped approximately 10 years ago.  Vaping Use  . Vaping Use: Never used  Substance and Sexual Activity  . Alcohol use: Yes    Alcohol/week: 2.0 standard drinks    Types: 2 Cans of beer per week    Comment: occasionally  . Drug use: Never  . Sexual activity: Yes    Birth control/protection: None  Other Topics Concern  . Not on file  Social History Narrative    lives in [place]; with son. Wife died in Aug 22, 2018.  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 Pt still drives and is indepent with no serious chronic disease.   Social Determinants of Health   Financial Resource Strain: Not on file  Food Insecurity: Not on file  Transportation Needs: Not on file  Physical Activity: Not on file  Stress: Not on file  Social Connections: Not on file  Intimate Partner Violence: Not on file    FAMILY HISTORY: Family History  Problem Relation Age of Onset  . Prostate cancer Neg Hx   . Kidney cancer Neg Hx   . Bladder Cancer Neg Hx     ALLERGIES:  has No Known Allergies.  MEDICATIONS:  Current Outpatient Medications  Medication Sig Dispense Refill  . aspirin EC 81 MG tablet Take 81 mg by mouth daily.    Marland Kitchen levothyroxine (SYNTHROID) 88 MCG tablet TAKE 1 TABLET BY MOUTH ONCE A DAY. TAKE ON AN EMPTY STOMACH WITH A GLASS OF WATER ATLEAST 30-60 MIN BEFORE BREAKFAST 90 tablet 3  .  loratadine (CLARITIN) 10 MG tablet Take 1 tablet (10 mg total) by mouth daily. 30 tablet 11  . mometasone (NASONEX) 50 MCG/ACT nasal spray Place 2 sprays into the nose daily. 1 each 12  . Multiple Vitamin (MULTIVITAMIN WITH MINERALS) TABS tablet Take 1 tablet by mouth daily.     . prednisoLONE acetate (PRED FORTE) 1 % ophthalmic suspension SMARTSIG:In Eye(s)    . predniSONE (DELTASONE) 5 MG tablet Take 1 tablet (5 mg total) by mouth daily with breakfast. 60 tablet 0   No current facility-administered medications for this visit.      Marland Kitchen  PHYSICAL EXAMINATION: ECOG PERFORMANCE STATUS: 1 - Symptomatic but completely ambulatory  Vitals:   01/14/21 1311  BP: (!) 144/66  Pulse: 61  Resp: 16  Temp: (!) 97.1 F (36.2 C)  SpO2: 100%   Filed Weights   01/14/21 1311  Weight: 129 lb (58.5 kg)    Physical Exam Constitutional:      Comments: Walking himself.  Thin built moderately nourished male patient.  Accompanied by son.  HENT:     Head: Normocephalic and atraumatic.     Mouth/Throat:     Pharynx: No oropharyngeal exudate.  Eyes:     Pupils: Pupils are equal, round, and reactive to light.     Comments: Chronic drooping of the left eyelid.  Cardiovascular:     Rate and Rhythm: Normal rate and regular rhythm.  Pulmonary:     Effort: No respiratory distress.     Breath sounds: No wheezing.  Abdominal:     General: Bowel sounds are normal. There is no distension.     Palpations: Abdomen is soft. There is no mass.     Tenderness: There is no abdominal tenderness. There is no guarding or rebound.  Musculoskeletal:        General: No tenderness. Normal range of motion.     Cervical back: Normal range of motion and neck supple.  Skin:    General: Skin is warm.  Neurological:     Mental Status: He is alert and oriented to person, place, and time.  Psychiatric:        Mood and Affect: Affect normal.     LABORATORY DATA:  I have reviewed the data as listed Lab Results   Component Value Date   WBC 10.6 (H) 01/14/2021   HGB 11.3 (L) 01/14/2021   HCT 34.2 (L) 01/14/2021   MCV 101.8 (H) 01/14/2021   PLT 235 01/14/2021   Recent Labs    02/05/20 0835 02/19/20 0813 03/04/20 0801 03/18/20 0819 11/16/20 1018 12/15/20 1025 01/14/21 1251  NA 135 135 137   < > 137 138 136  K 4.3 4.7 4.7   < > 3.9 4.4 4.7  CL 107 106 107   < > 105 107 107  CO2 21* 21* 23   < > '23 25 23  ' GLUCOSE 139* 101* 98   < > 73  104* 92  BUN 49* 34* 33*   < > 35* 33* 43*  CREATININE 2.30* 2.24* 2.29*   < > 2.11* 2.02* 2.31*  CALCIUM 9.0 8.9 8.9   < > 9.0 9.2 9.1  GFRNONAA 25* 26* 25*   < > 30* 32* 27*  GFRAA 29* 30* 29*  --   --   --   --   PROT 7.1 7.0 7.0   < > 6.6 6.7 7.1  ALBUMIN 4.0 3.9 4.0   < > 3.7 3.5 3.9  AST '20 17 18   ' < > '21 20 20  ' ALT '13 12 13   ' < > '18 17 17  ' ALKPHOS 70 70 65   < > 61 66 63  BILITOT 0.6 0.8 0.6   < > 0.5 0.6 0.6   < > = values in this interval not displayed.    RADIOGRAPHIC STUDIES: I have personally reviewed the radiological images as listed and agreed with the findings in the report. No results found.   ASSESSMENT & PLAN:   Cancer of overlapping sites of bladder ([place]) # High-grade transitional cell carcinoma of the bladder metastatic to retroperitoneal lymph node.  Stage IV- CT A/P- MARCH 2022- [facility]-no evidence of any distant metastatic disease in the abdomen pelvis; bladder wall thickening/nodularity concerning for malignancy-Clinically stable.    # Continue to hold Opdivo given patient's overall poor tolerance to therapy; continue surveillance for now.  We will plan imaging in the next month; ordered scan today.   # Local recurrent bladder ca < 1cm [Dr.Stoiof March 2022]- agree with TURBT- STABLE.   #CT chest groundglass opacities-less likely infection; suspect hypersensitivity pneumonitis-continue prednisone at  5 mg a day.  Again reviewed the CT scan imaging with the son.  We will make again #3 Referral to [facility]   # Iatrogenic  hypothyroidism-on Synthroid 88 mcg; TSH JAN 2022- WNL- STABLE.    # Anemia sec to CKD-III-IV/ on Iv iron.Hb-11-12-Clinically- STABLE; Hold Venofer today.  # CKD stageIII-GFR- 28-30-Clinically  STABLE.    #Weight loss improving; continue prednisone for appetite/also possible hypersensitivity pneumonitis.  Check TSH next visit  # DISPOSITION:  # HOLD venofer  # Follow-up in  36monthMD labs-cbc/cmp/possible venofer; CT scan C/A/P-; Dr.B     All questions were answered. The patient knows to call the clinic with any problems, questions or concerns.    GCammie Sickle MD 01/14/2021 2:20 PM

## 2021-01-31 ENCOUNTER — Encounter: Payer: Self-pay | Admitting: *Deleted

## 2021-02-01 DIAGNOSIS — R918 Other nonspecific abnormal finding of lung field: Secondary | ICD-10-CM | POA: Diagnosis not present

## 2021-02-01 DIAGNOSIS — R0609 Other forms of dyspnea: Secondary | ICD-10-CM | POA: Diagnosis not present

## 2021-02-10 ENCOUNTER — Other Ambulatory Visit: Payer: Self-pay | Admitting: *Deleted

## 2021-02-10 DIAGNOSIS — C678 Malignant neoplasm of overlapping sites of bladder: Secondary | ICD-10-CM

## 2021-02-10 DIAGNOSIS — E032 Hypothyroidism due to medicaments and other exogenous substances: Secondary | ICD-10-CM

## 2021-02-15 ENCOUNTER — Ambulatory Visit
Admission: RE | Admit: 2021-02-15 | Discharge: 2021-02-15 | Disposition: A | Discharge status: Home / Self Care | Payer: Medicare HMO | Source: Ambulatory Visit | Attending: Internal Medicine | Admitting: Internal Medicine

## 2021-02-15 ENCOUNTER — Other Ambulatory Visit: Payer: Self-pay | Admitting: *Deleted

## 2021-02-15 ENCOUNTER — Other Ambulatory Visit: Payer: Self-pay

## 2021-02-15 DIAGNOSIS — C679 Malignant neoplasm of bladder, unspecified: Secondary | ICD-10-CM | POA: Diagnosis not present

## 2021-02-15 DIAGNOSIS — K449 Diaphragmatic hernia without obstruction or gangrene: Secondary | ICD-10-CM | POA: Diagnosis not present

## 2021-02-15 DIAGNOSIS — J439 Emphysema, unspecified: Secondary | ICD-10-CM | POA: Diagnosis not present

## 2021-02-15 DIAGNOSIS — C678 Malignant neoplasm of overlapping sites of bladder: Secondary | ICD-10-CM | POA: Insufficient documentation

## 2021-02-15 DIAGNOSIS — I700 Atherosclerosis of aorta: Secondary | ICD-10-CM | POA: Diagnosis not present

## 2021-02-15 DIAGNOSIS — N400 Enlarged prostate without lower urinary tract symptoms: Secondary | ICD-10-CM | POA: Diagnosis not present

## 2021-02-15 IMAGING — CT CT CHEST W/O CM
2 of 4 series · 12 of 36 positions shown, 15 images · non-contrast
Comparison: CT chest, [DATE], CT chest abdomen pelvis,
[DATE]

CLINICAL DATA: Follow-up bladder cancer, status post TURBT and
chemotherapy, former smoker

EXAM:
CT CHEST, ABDOMEN AND PELVIS WITHOUT CONTRAST
TECHNIQUE: Multidetector CT imaging of the chest, abdomen and pelvis was
performed following the standard protocol without IV contrast. Oral
enteric contrast was administered

[Series 2: axials cap 5.00 · axial · 0.67mm/px · z∈[-1475,-925]mm · 9 of 132 slices shown, 12 images]
[im 11/132  mediastinal]
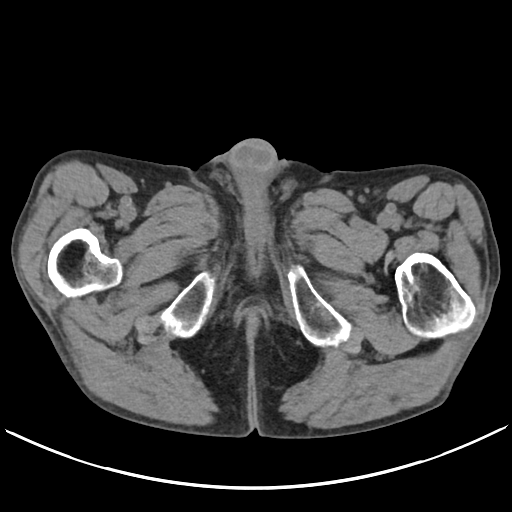
[im 11/132  lung]
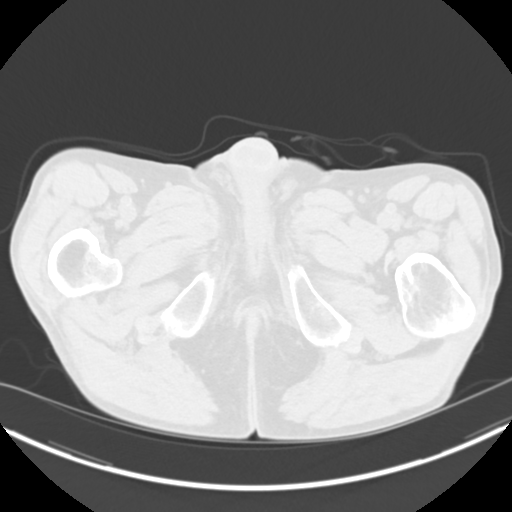
[im 22/132  lung]
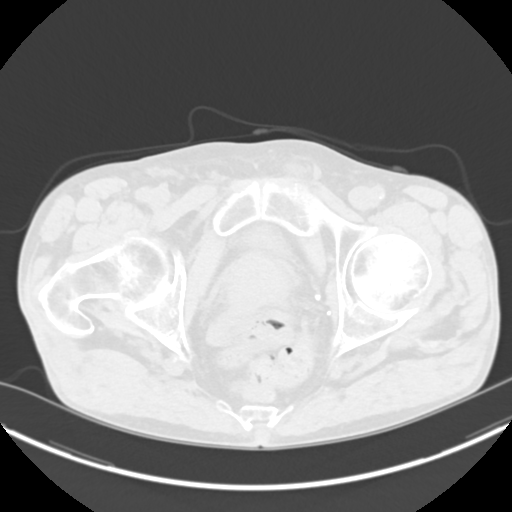
[im 44/132  lung]
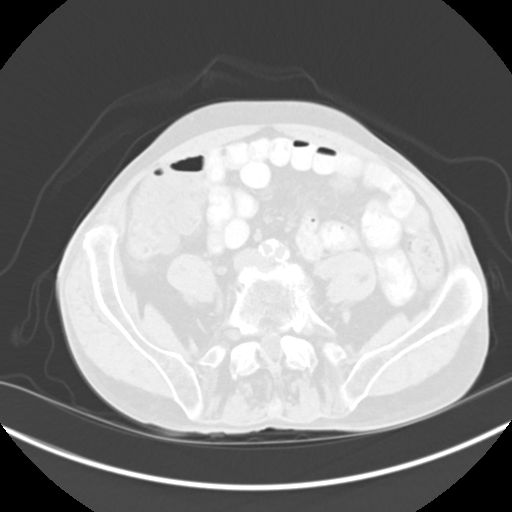
[im 55/132  lung]
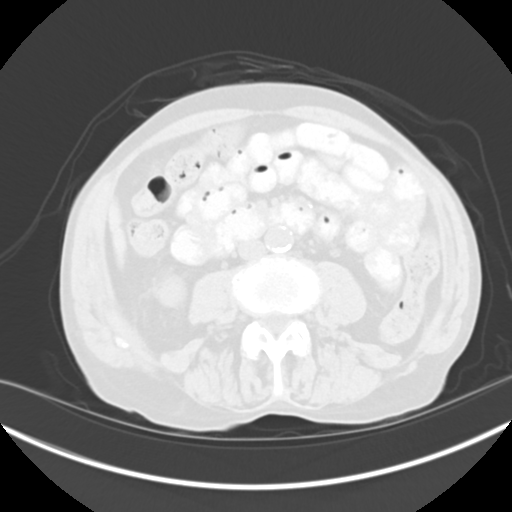
[im 66/132  mediastinal]
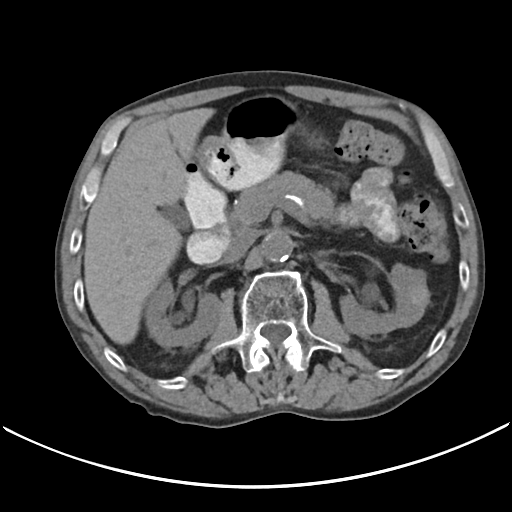
[im 66/132  lung]
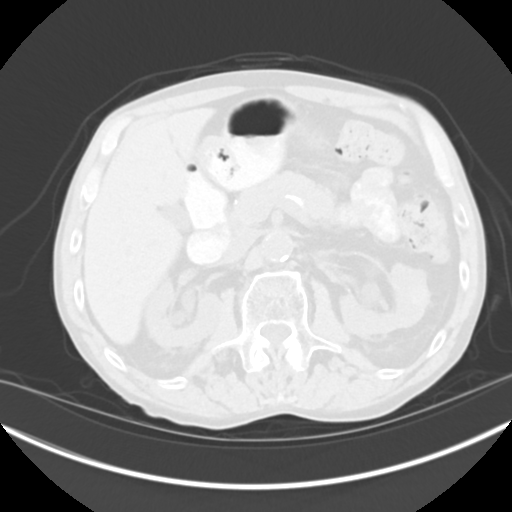
[im 77/132  lung]
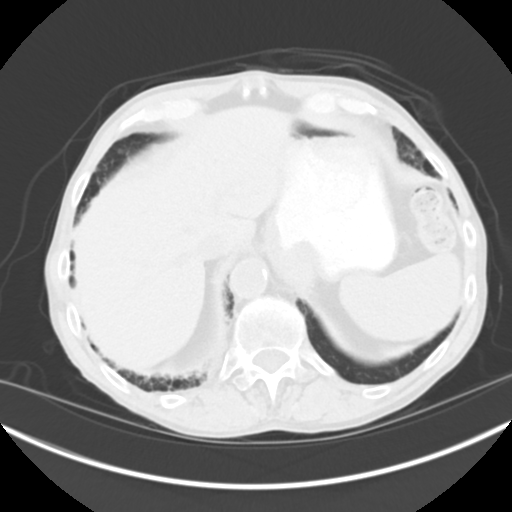
[im 88/132  lung]
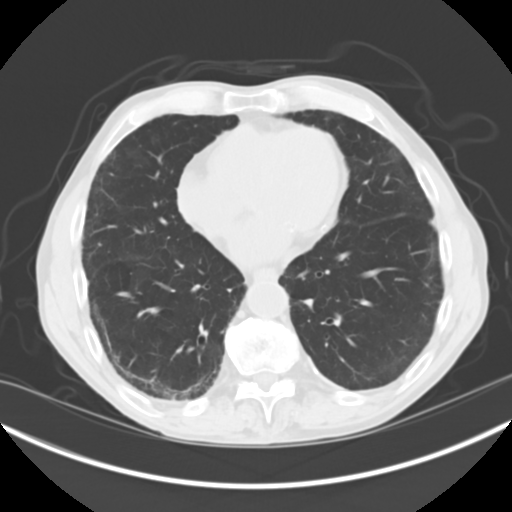
[im 110/132  lung]
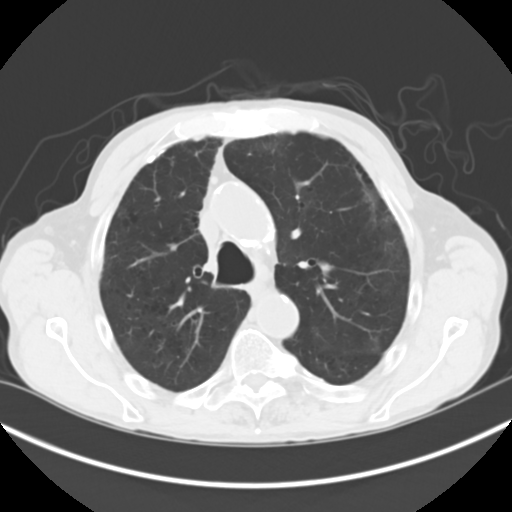
[im 121/132  mediastinal]
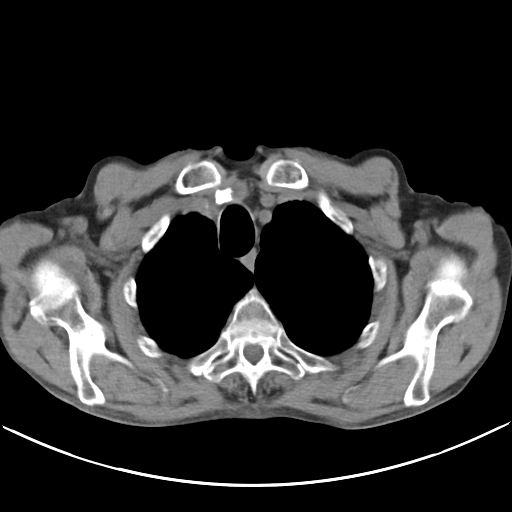
[im 121/132  lung]
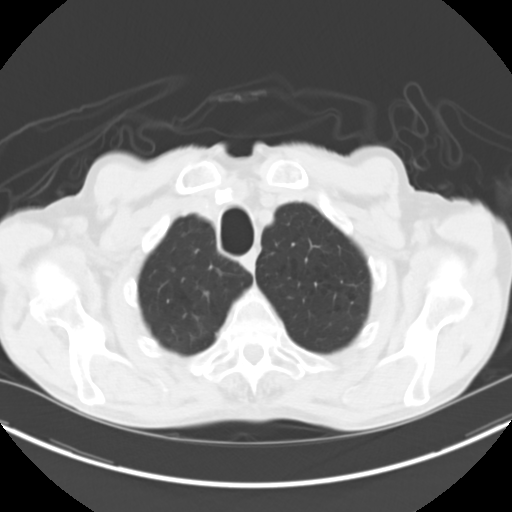

[Series 4: coronals cap 2.00 cor · coronal · 0.67mm/px · 3 of 133 slices shown]
[im 27/133  lung]
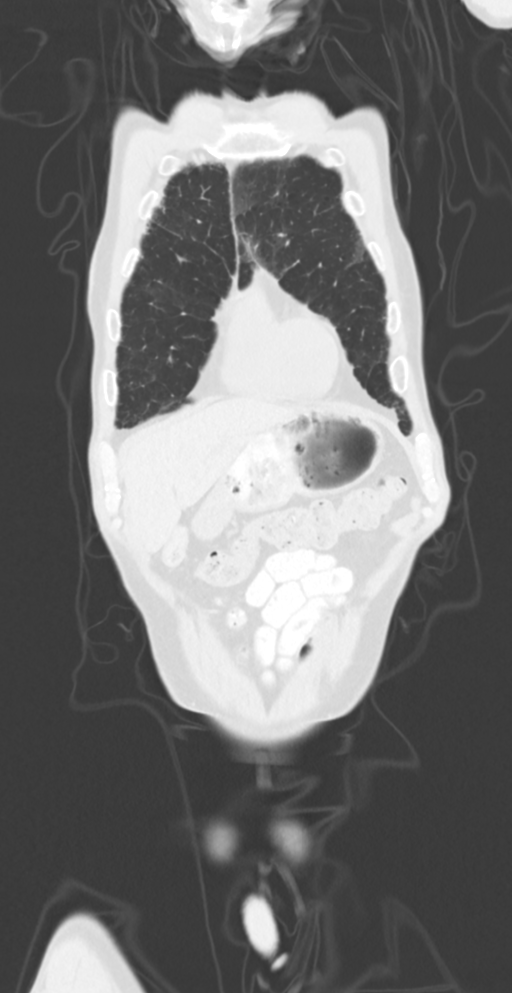
[im 53/133  lung]
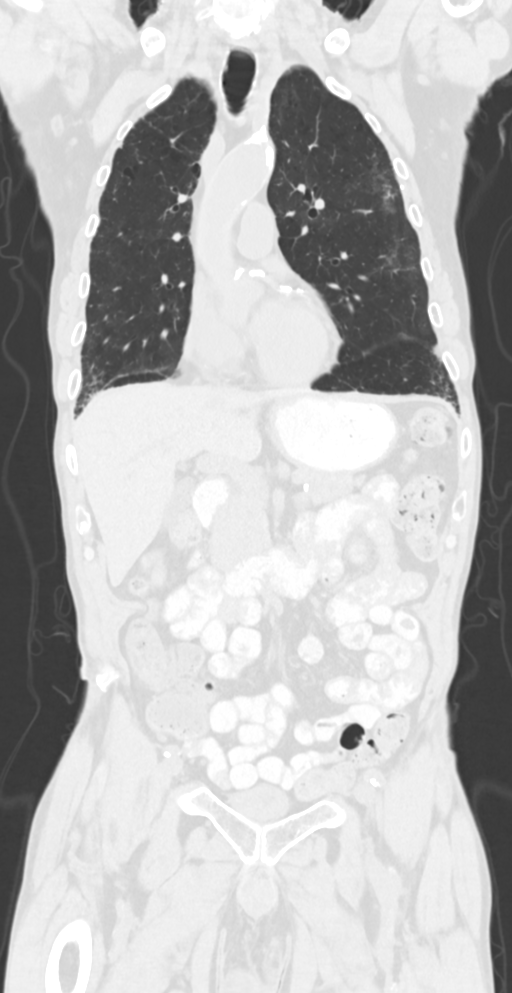
[im 80/133  lung]
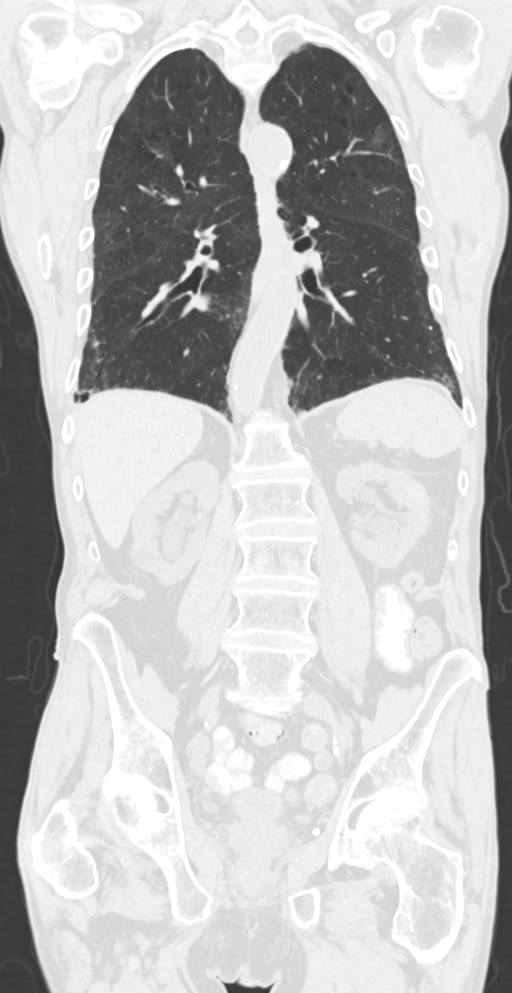

[12 of 36 positions shown; findings below may reference images not displayed]

FINDINGS: CT CHEST FINDINGS

Cardiovascular: Aortic atherosclerosis. Normal heart size.
Three-vessel coronary artery calcifications. No pericardial
effusion.

Mediastinum/Nodes: No enlarged mediastinal, hilar, or axillary lymph
nodes. Small hiatal hernia. Thyroid gland, trachea, and esophagus
demonstrate no significant findings.

Lungs/Pleura: Moderate centrilobular emphysema. Diffuse bilateral
bronchial wall thickening. Scarring and or fibrosis in the right
greater than left lung bases, findings characterized by subpleural
bronchiolectasis in the costophrenic recesses and irregular
peripheral ground-glass opacity in the dependent bilateral lung
bases, with arcing subpleural elements and evidence of subpleural
sparing (series 3, image 97, 134). Calcified bilateral pleural
plaques.

Musculoskeletal: No chest wall mass or suspicious bone lesions
identified.

CT ABDOMEN PELVIS FINDINGS

Hepatobiliary: No solid liver abnormality is seen. No gallstones,
gallbladder wall thickening, or biliary dilatation.

Pancreas: Unremarkable. No pancreatic ductal dilatation or
surrounding inflammatory changes.

Spleen: Normal in size without significant abnormality.

Adrenals/Urinary Tract: Adrenal glands are unremarkable. Kidneys are
normal, without renal calculi, solid lesion, or hydronephrosis.
Diffusely thickened, decompressed urinary bladder. No obvious
recurrent bladder mass on noncontrast CT.

Stomach/Bowel: Stomach is within normal limits. Appendix appears
normal. No evidence of bowel wall thickening, distention, or
inflammatory changes.

Vascular/Lymphatic: Aortic atherosclerosis. No enlarged abdominal or
pelvic lymph nodes.

Reproductive: Prostatomegaly.

Other: No abdominal wall hernia or abnormality. No abdominopelvic
ascites.

Musculoskeletal: No acute or significant osseous findings.
IMPRESSION: 1. Diffusely thickened, decompressed urinary bladder, this
appearance most likely related to chronic bladder outlet
obstruction. No obvious recurrent bladder mass on noncontrast CT.
2. No noncontrast evidence of lymphadenopathy or metastatic disease
in the chest, abdomen, or pelvis.
3. Emphysema and diffuse bilateral bronchial wall thickening.
4. Scarring and or fibrosis in the right greater than left lung
bases, findings characterized by subpleural bronchiolectasis in the
costophrenic recesses and irregular peripheral ground-glass opacity
in the dependent bilateral lung bases, with arcing subpleural
elements and evidence of subpleural sparing. Findings most likely
reflect sequelae of prior infection or inflammation, however
fibrotic interstitial lung disease remains a differential
consideration.
5. Calcified bilateral pleural plaques, consistent with prior
asbestos exposure.
6. Coronary artery disease.

Aortic Atherosclerosis ([4C]-[4C]) and Emphysema ([4C]-[4C]).

## 2021-02-15 IMAGING — CT CT ABD-PELV W/O CM
2 of 4 series · 13 of 46 positions shown, 15 images · non-contrast
Comparison: CT chest, [DATE], CT chest abdomen pelvis,
[DATE]

CLINICAL DATA: Follow-up bladder cancer, status post TURBT and
chemotherapy, former smoker

EXAM:
CT CHEST, ABDOMEN AND PELVIS WITHOUT CONTRAST
TECHNIQUE: Multidetector CT imaging of the chest, abdomen and pelvis was
performed following the standard protocol without IV contrast. Oral
enteric contrast was administered

[Series 2: axials cap 5.00 · axial · 0.67mm/px · z∈[-1480,-920]mm · 10 of 132 slices shown, 12 images]
[im 10/132  soft-tissue]
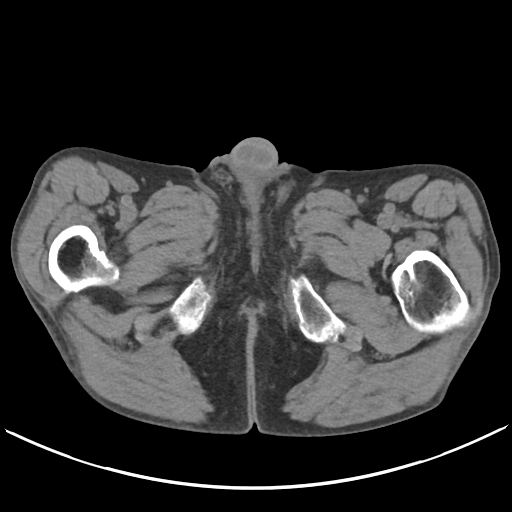
[im 10/132  bone]
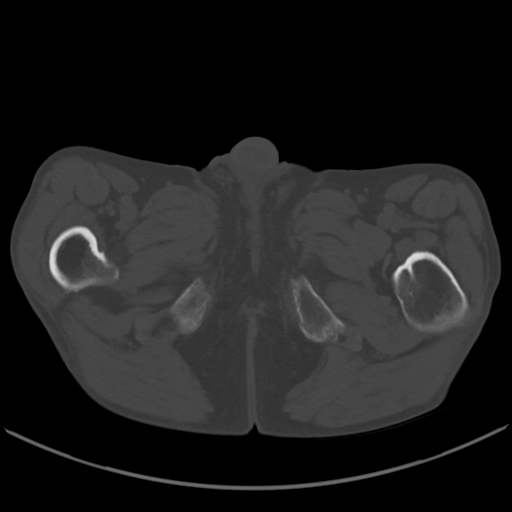
[im 19/132  soft-tissue]
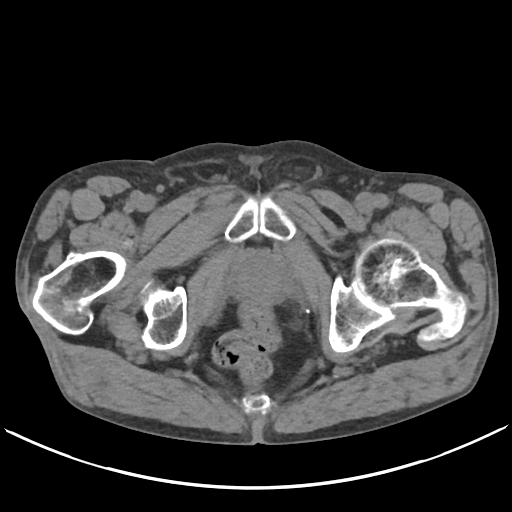
[im 38/132  soft-tissue]
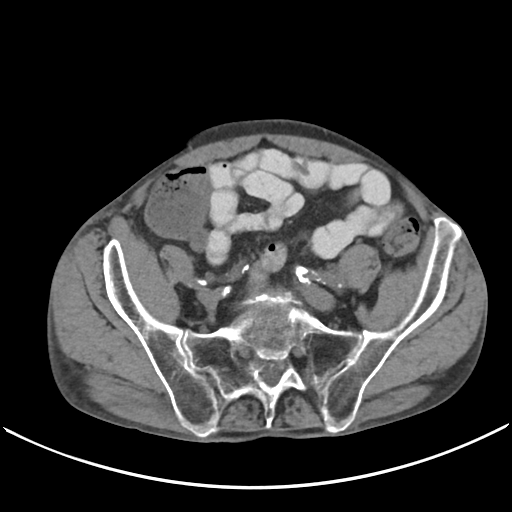
[im 47/132  soft-tissue]
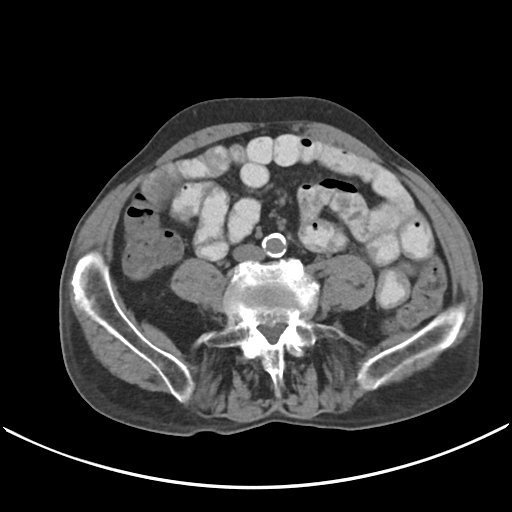
[im 57/132  soft-tissue]
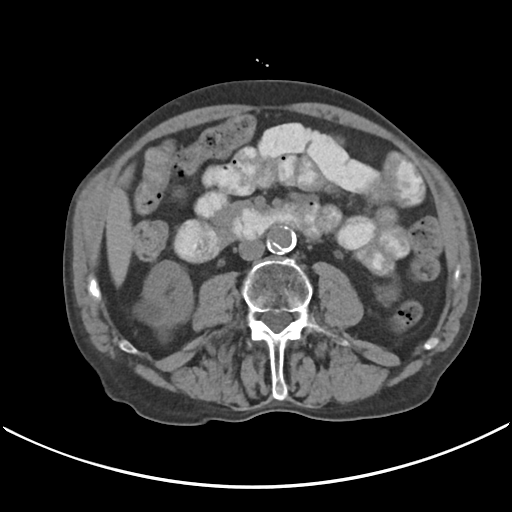
[im 75/132  soft-tissue]
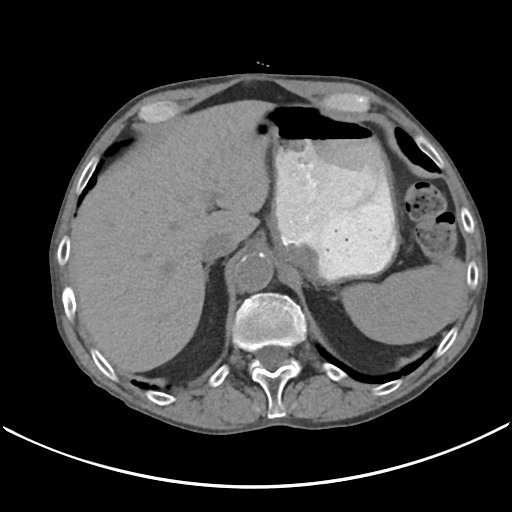
[im 85/132  soft-tissue]
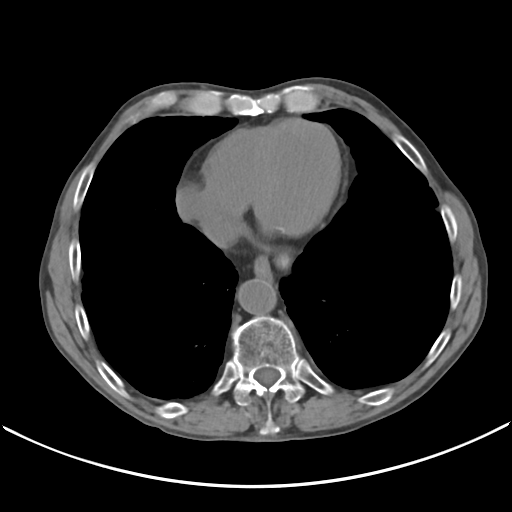
[im 94/132  soft-tissue]
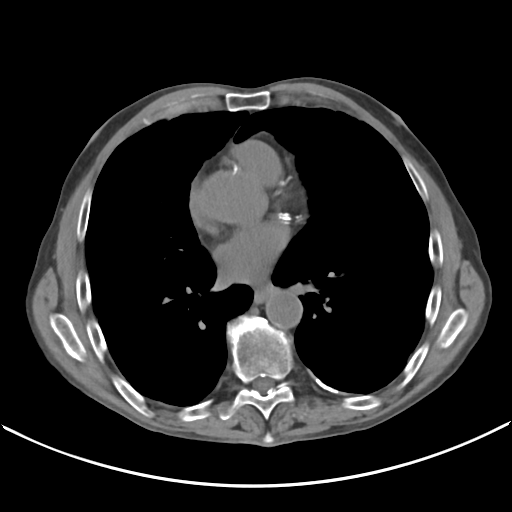
[im 113/132  soft-tissue]
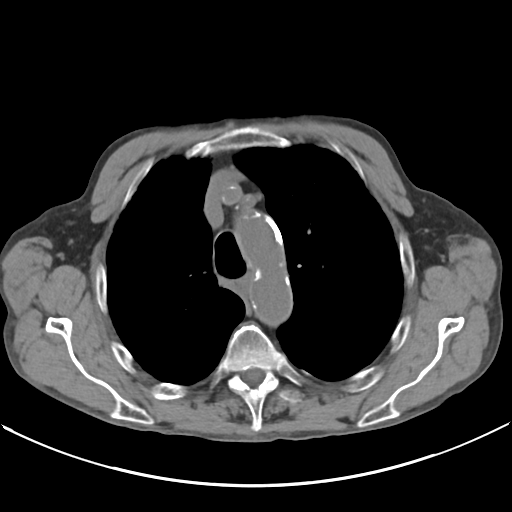
[im 113/132  bone]
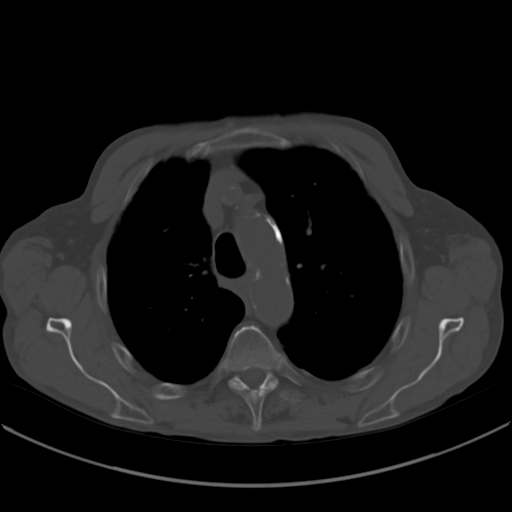
[im 122/132  soft-tissue]
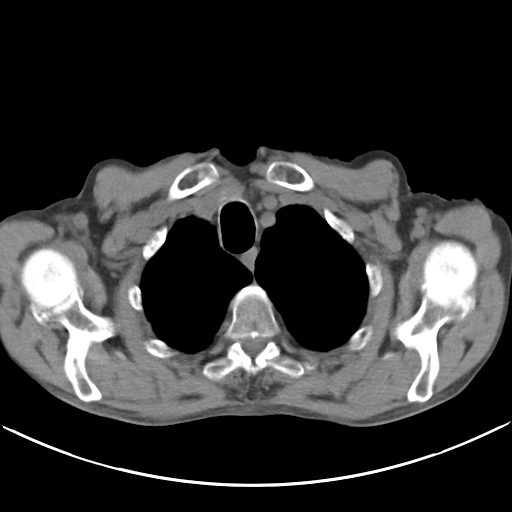

[Series 4: coronals cap 2.00 cor · coronal · 0.67mm/px · 3 of 133 slices shown]
[im 45/133  soft-tissue]
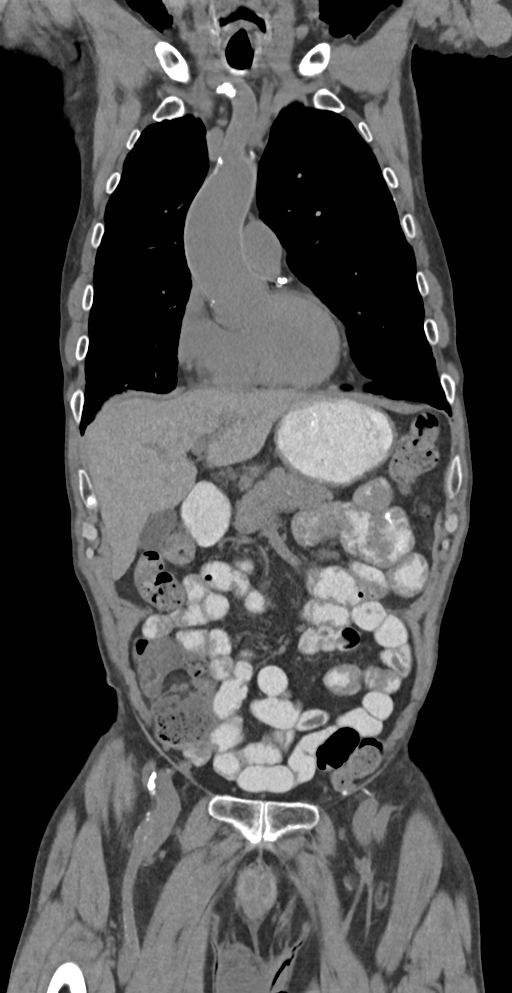
[im 59/133  soft-tissue]
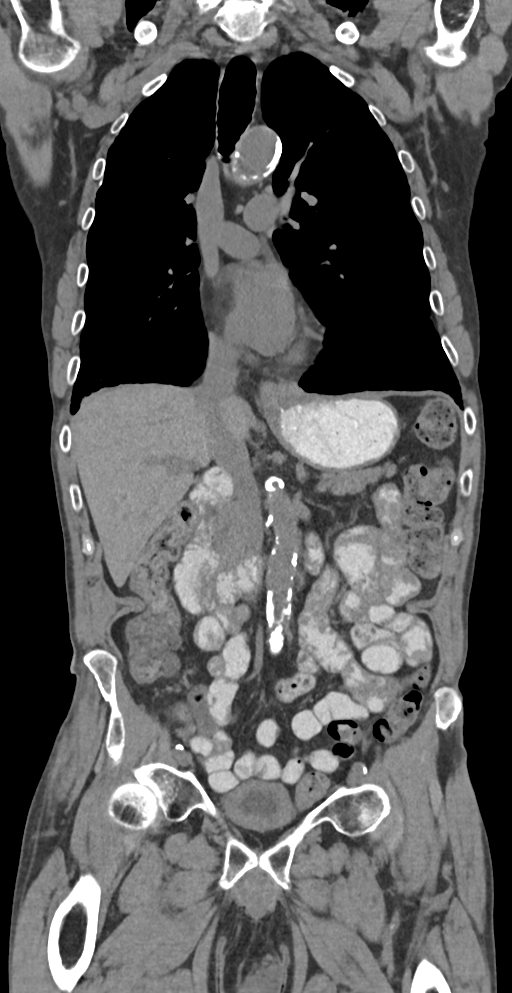
[im 74/133  soft-tissue]
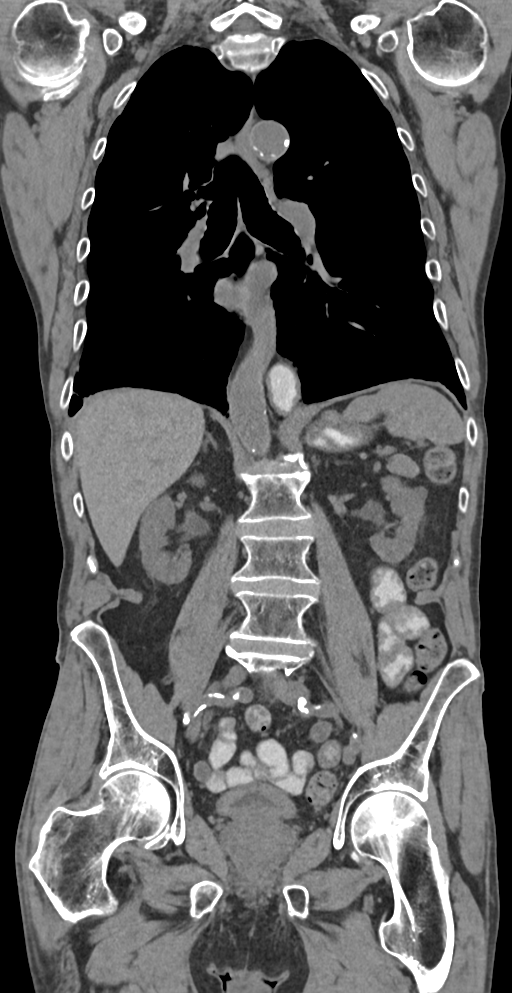

[13 of 46 positions shown; findings below may reference images not displayed]

FINDINGS: CT CHEST FINDINGS

Cardiovascular: Aortic atherosclerosis. Normal heart size.
Three-vessel coronary artery calcifications. No pericardial
effusion.

Mediastinum/Nodes: No enlarged mediastinal, hilar, or axillary lymph
nodes. Small hiatal hernia. Thyroid gland, trachea, and esophagus
demonstrate no significant findings.

Lungs/Pleura: Moderate centrilobular emphysema. Diffuse bilateral
bronchial wall thickening. Scarring and or fibrosis in the right
greater than left lung bases, findings characterized by subpleural
bronchiolectasis in the costophrenic recesses and irregular
peripheral ground-glass opacity in the dependent bilateral lung
bases, with arcing subpleural elements and evidence of subpleural
sparing (series 3, image 97, 134). Calcified bilateral pleural
plaques.

Musculoskeletal: No chest wall mass or suspicious bone lesions
identified.

CT ABDOMEN PELVIS FINDINGS

Hepatobiliary: No solid liver abnormality is seen. No gallstones,
gallbladder wall thickening, or biliary dilatation.

Pancreas: Unremarkable. No pancreatic ductal dilatation or
surrounding inflammatory changes.

Spleen: Normal in size without significant abnormality.

Adrenals/Urinary Tract: Adrenal glands are unremarkable. Kidneys are
normal, without renal calculi, solid lesion, or hydronephrosis.
Diffusely thickened, decompressed urinary bladder. No obvious
recurrent bladder mass on noncontrast CT.

Stomach/Bowel: Stomach is within normal limits. Appendix appears
normal. No evidence of bowel wall thickening, distention, or
inflammatory changes.

Vascular/Lymphatic: Aortic atherosclerosis. No enlarged abdominal or
pelvic lymph nodes.

Reproductive: Prostatomegaly.

Other: No abdominal wall hernia or abnormality. No abdominopelvic
ascites.

Musculoskeletal: No acute or significant osseous findings.
IMPRESSION: 1. Diffusely thickened, decompressed urinary bladder, this
appearance most likely related to chronic bladder outlet
obstruction. No obvious recurrent bladder mass on noncontrast CT.
2. No noncontrast evidence of lymphadenopathy or metastatic disease
in the chest, abdomen, or pelvis.
3. Emphysema and diffuse bilateral bronchial wall thickening.
4. Scarring and or fibrosis in the right greater than left lung
bases, findings characterized by subpleural bronchiolectasis in the
costophrenic recesses and irregular peripheral ground-glass opacity
in the dependent bilateral lung bases, with arcing subpleural
elements and evidence of subpleural sparing. Findings most likely
reflect sequelae of prior infection or inflammation, however
fibrotic interstitial lung disease remains a differential
consideration.
5. Calcified bilateral pleural plaques, consistent with prior
asbestos exposure.
6. Coronary artery disease.

Aortic Atherosclerosis ([4C]-[4C]) and Emphysema ([4C]-[4C]).

## 2021-02-15 NOTE — Patient Outreach (Signed)
[facility]) Care Management  02/15/2021  Edward Cummings 1932/07/01 [id number]  [facility] outreach follow up with complex care patient   Edward Cummings as re assigned to this RN CM on 12/08/20 after referred to [facility] on 11/26/20 for complex care services/ [employer]. Edward [place] was noted with 1 ED visit/COPD/HTN     Edward Begley is able to verify HIPAA identifiers     Follow up assessment Edward Klinke reports he is doing well today  He is preparing to go to have a CT completed per his oncologist, Dr Rogue Bussing orders   Today Edward Jachim and RN CM reviewed his Chronic Kidney disease (CKD) home treatment. He reports good home management. He denies worsening symptoms of edema, elevated Blood pressure, fatigue He is able to review with RN CM that his medical concerns initiated from his Stage IV bladder cancer and lead to concerns with his kidneys and blood pressure. He and RN CM discussed the correlation of his medical history. He feels his CKD and Hypertension (HTN) are controlled well at home now.  He voices primary home management of his respiratory symptoms and hypothyroidism. He confirms he is being followed by pulmonology and oncology. He states recent noted dark spots on imaging. He had a history of cigarette smoking  Today he is noted with some audible congestion, clearing of his throat with sputum production. He reports the sputum is not yellow nor green but primarily clear to white. With review of his home medications he confirms he does not feel that the nasonex that has been recommended after several visits with various providers is beneficial. He understands post nasal drip, "Drain from my nose to the back of my throat and I have to get it out"  RN CM reviewed other possible options like keeping the nares clean,  Navage/Neti pot, Flonase, allergy management. He was encouraged to seek other home alternative treatments at his local pharmacy Importance of mucus monitoring and  removal discussed  Importance of CT Head and other imaging to monitor for possible metastasis, the process of metastasis/circulation/lymphatic system, preventive care for other illness discussed  Questions answered He voiced understanding  Plans  Patient agrees to care plan and follow up within the next 21-30 business days  Goals Addressed               This Visit's Progress     Patient Stated     Eat Healthy as evidenced by wt gain over the next 3 months. [facility]) (pt-stated)   On track     Timeframe:  Long-Range Goal Priority:  High Start Date:    12/29/18                         Expected End Date:     04/10/21                  Follow Up Date 03/08/21 Barriers: Health Behaviors     - set goal weight - drink 6 to 8 glasses of water each day  Notes:  02/15/21 Appetite remains good Has not weighed today 01/12/21 He reports his appetite remains good and he is eating 3 times a day He notes an improvement in his weight of today at 126 lbs (had been down to 120 lbs) 12/28/20 Pt doesn't have any dietary restrictions, he eats what he wants. He had a considerable wt loss last year and is now gaining back current wt is 125#,  Ht is 5'9" and his BMI is 18.46. Would like to see pt gain an additional 5 pounds to 130# over the next 3 months.He is drinking a nutritional supplement occasionally. Encourage him to drink one daily and a balanced diet.      Learn More About [facility]) (pt-stated)   On track     Timeframe:  Short-Term Goal Priority:  Medium Start Date:                        02/15/21     Expected End Date:          04/11/21              Follow Up Date 03/08/21   Barriers: Knowledge  - make a list of questions - ask questions    Notes:  02/15/21 asked questions about post nasal drainage, nasonex      Track and Manage My Blood Pressure-Hypertension ([facility]) (pt-stated)   On track     Timeframe:  Long-Range Goal Priority:  High Start Date:                   01/12/21           Expected End Date:       05/11/21                Follow Up Date 03/08/21  Barriers: Knowledge   - check blood pressure weekly    Notes:  02/15/21 No worsening symptoms BP managed well at home  01/12/21 Assessed for any worsening symptoms He denies headaches and chest pain but reports some shortness of breath (sob) when he is in heat       Will attend appointments with primary care and specialty MDs as scheduled over the next 3 months.([facility]) (pt-stated)   On track     Start date 12/28/20 High priority Long term goal Expected end date 04/11/21 Barriers: Health Behaviors  Notes.  02/15/21 Pt continues to attend scheduled appointments. Attending a CT head at 2 pm today ordered by oncologist 01/12/21 Completes all appointments  Put his appointments on his phone 12/28/20 Pt has a good record of attending appts. He can drive and his family supervising him and keeps up with his medical care. He has a PE on 12/30/20 and follow up with oncology for chemotherapy in August.         [place]. Lavina Hamman, RN, BSN, [place] Coordinator Office number [phone] [facility] number [phone] Fax number [phone]

## 2021-02-16 ENCOUNTER — Inpatient Hospital Stay: Payer: Medicare HMO | Attending: Internal Medicine

## 2021-02-16 ENCOUNTER — Inpatient Hospital Stay: Payer: Medicare HMO

## 2021-02-16 ENCOUNTER — Inpatient Hospital Stay (HOSPITAL_BASED_OUTPATIENT_CLINIC_OR_DEPARTMENT_OTHER): Payer: Medicare HMO | Admitting: Internal Medicine

## 2021-02-16 ENCOUNTER — Encounter: Payer: Self-pay | Admitting: Internal Medicine

## 2021-02-16 DIAGNOSIS — I10 Essential (primary) hypertension: Secondary | ICD-10-CM | POA: Diagnosis not present

## 2021-02-16 DIAGNOSIS — Z79899 Other long term (current) drug therapy: Secondary | ICD-10-CM | POA: Insufficient documentation

## 2021-02-16 DIAGNOSIS — N183 Chronic kidney disease, stage 3 unspecified: Secondary | ICD-10-CM | POA: Insufficient documentation

## 2021-02-16 DIAGNOSIS — Z7952 Long term (current) use of systemic steroids: Secondary | ICD-10-CM | POA: Diagnosis not present

## 2021-02-16 DIAGNOSIS — C678 Malignant neoplasm of overlapping sites of bladder: Secondary | ICD-10-CM

## 2021-02-16 DIAGNOSIS — Z87891 Personal history of nicotine dependence: Secondary | ICD-10-CM | POA: Insufficient documentation

## 2021-02-16 DIAGNOSIS — N400 Enlarged prostate without lower urinary tract symptoms: Secondary | ICD-10-CM | POA: Diagnosis not present

## 2021-02-16 DIAGNOSIS — E032 Hypothyroidism due to medicaments and other exogenous substances: Secondary | ICD-10-CM | POA: Diagnosis not present

## 2021-02-16 DIAGNOSIS — C772 Secondary and unspecified malignant neoplasm of intra-abdominal lymph nodes: Secondary | ICD-10-CM | POA: Insufficient documentation

## 2021-02-16 DIAGNOSIS — D631 Anemia in chronic kidney disease: Secondary | ICD-10-CM | POA: Diagnosis not present

## 2021-02-16 DIAGNOSIS — Z7951 Long term (current) use of inhaled steroids: Secondary | ICD-10-CM | POA: Diagnosis not present

## 2021-02-16 DIAGNOSIS — E039 Hypothyroidism, unspecified: Secondary | ICD-10-CM | POA: Insufficient documentation

## 2021-02-16 DIAGNOSIS — Z7982 Long term (current) use of aspirin: Secondary | ICD-10-CM | POA: Insufficient documentation

## 2021-02-16 LAB — CBC WITH DIFFERENTIAL/PLATELET
Abs Immature Granulocytes: 0.06 10*3/uL (ref 0.00–0.07)
Basophils Absolute: 0.1 10*3/uL (ref 0.0–0.1)
Basophils Relative: 1 %
Eosinophils Absolute: 1.1 10*3/uL — ABNORMAL HIGH (ref 0.0–0.5)
Eosinophils Relative: 10 %
HCT: 34.1 % — ABNORMAL LOW (ref 39.0–52.0)
Hemoglobin: 11.1 g/dL — ABNORMAL LOW (ref 13.0–17.0)
Immature Granulocytes: 1 %
Lymphocytes Relative: 29 %
Lymphs Abs: 3.2 10*3/uL (ref 0.7–4.0)
MCH: 33.6 pg (ref 26.0–34.0)
MCHC: 32.6 g/dL (ref 30.0–36.0)
MCV: 103.3 fL — ABNORMAL HIGH (ref 80.0–100.0)
Monocytes Absolute: 1 10*3/uL (ref 0.1–1.0)
Monocytes Relative: 9 %
Neutro Abs: 5.6 10*3/uL (ref 1.7–7.7)
Neutrophils Relative %: 50 %
Platelets: 220 10*3/uL (ref 150–400)
RBC: 3.3 MIL/uL — ABNORMAL LOW (ref 4.22–5.81)
RDW: 12.9 % (ref 11.5–15.5)
WBC: 11 10*3/uL — ABNORMAL HIGH (ref 4.0–10.5)
nRBC: 0 % (ref 0.0–0.2)

## 2021-02-16 LAB — COMPREHENSIVE METABOLIC PANEL
ALT: 21 U/L (ref 0–44)
AST: 24 U/L (ref 15–41)
Albumin: 3.6 g/dL (ref 3.5–5.0)
Alkaline Phosphatase: 59 U/L (ref 38–126)
Anion gap: 5 (ref 5–15)
BUN: 34 mg/dL — ABNORMAL HIGH (ref 8–23)
CO2: 24 mmol/L (ref 22–32)
Calcium: 9 mg/dL (ref 8.9–10.3)
Chloride: 108 mmol/L (ref 98–111)
Creatinine, Ser: 2.13 mg/dL — ABNORMAL HIGH (ref 0.61–1.24)
GFR, Estimated: 30 mL/min — ABNORMAL LOW (ref >60)
Glucose, Bld: 93 mg/dL (ref 70–99)
Potassium: 3.7 mmol/L (ref 3.5–5.1)
Sodium: 137 mmol/L (ref 135–145)
Total Bilirubin: 0.4 mg/dL (ref 0.3–1.2)
Total Protein: 6.6 g/dL (ref 6.5–8.1)

## 2021-02-16 LAB — TSH: TSH: 3.116 u[IU]/mL (ref 0.350–4.500)

## 2021-02-16 MED ORDER — PREDNISONE 5 MG PO TABS: 5.0000 mg | ORAL_TABLET | Freq: Every day | ORAL | 0 refills | Status: DC

## 2021-02-16 MED ORDER — LEVOTHYROXINE SODIUM 88 MCG PO TABS: ORAL_TABLET | ORAL | 3 refills | Status: DC

## 2021-02-16 NOTE — Patient Instructions (Signed)
#  Please call the lung doctor to follow-up on the CT scan that was done on September 6/for further recommendations.

## 2021-02-16 NOTE — Progress Notes (Signed)
Pt in for follow up, states has being doing well.  Denies any concerns today.Pt son with pt today in clinic.

## 2021-02-16 NOTE — Assessment & Plan Note (Addendum)
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  Stage IV-SEP 6th, 2022- CT CAP-no evidence of any recurrent disease.  Continue surveillance off therapy with imaging every 6 months or so.   # Local recurrent bladder ca < 1cm [Dr.Stoiof March 2022]- agree with TURBT- STABLE.    # Pulmonary: SEP 2022- CT-emphysema /bronchial thickening /bilateral basilar groundglass opacities -?  Resolving  BOOP/ fibrosis.  Discussed with Dr. Lanney Gins.  Recommend patient follow-up with pulmonary.  # Iatrogenic hypothyroidism-on Synthroid 88 mcg; TSH JULY 2022- WNL-stable treatment.  # Anemia sec to CKD-III-IV/ on Iv iron.Hb-11-12-Clinically- STABLE; Hold Venofer today.  # CKD stageIII-GFR- 28-30-Clinically  STABLE.    #Weight loss improving; continue prednisone for appetite/also above pneumonitis.  New refill   # DISPOSITION:  # HOLD venofer  # Follow-up in  2 month MD labs-cbc/cmp;iron studies/ferritin/possible venofer; Dr.B  # I reviewed the blood work- with the patient in detail; also reviewed the imaging independently [as summarized above]; and with the patient in detail.

## 2021-02-16 NOTE — Progress Notes (Signed)
[facility] NOTE  Patient Care Team: [name], [name], [name], MD as Consulting Physician (Gastroenterology) Clyde Canterbury, MD as Referring Physician (Otolaryngology) Ottie Glazier, MD as Consulting Physician (Pulmonary Disease) Barbaraann Faster, RN as [place] [facility] Stoioff, Ronda Fairly, MD (Urology)  CHIEF COMPLAINTS/PURPOSE OF CONSULTATION: Bladder cancer   Oncology History Overview Note  # AUG 2019-TRANSITIONAL CELL BLADDER CA [~ 4cm tumor] s/p cystoscopy [Dr.Stoiff]  with extensive angiolymphatic invasion; [name]; [name]; mediastinal uptake; right pubic rami uptake.  # [name]; Jan 18th 2021- switched to [place]- [pt preference; [name]; July 2020 cystoscopy-no evidence of bladder malignancy/Dr. Bernardo Heater- enlarged prostate [PSA- 0.95; 2021]  # Molecular testing- PDL-1 CPS- 20%; [name]  ;GOALS: palliative  CURRENT/MOST RECENT THERAPY:OPDIVO [C]     Cancer of overlapping sites of bladder ([place])  02/28/2018 - 06/09/2019 Chemotherapy   The patient had atezolizumab (TECENTRIQ) 1,[name], 1,200 mg, Intravenous, Once, [name],200 mg (02/28/2018), 1,200 mg (03/21/2018), 1,200 mg (04/11/2018), 1,200 mg (05/02/2018), 1,200 mg (06/13/2018), 1,200 mg (05/23/2018), 1,200 mg (07/04/2018), 1,200 mg (07/25/2018), 1,200 mg (08/15/2018), 1,200 mg (09/05/2018), 1,200 mg (10/25/2018), 1,200 mg (11/22/2018), 1,200 mg (12/20/2018), 1,200 mg (01/10/2019), 1,200 mg (01/31/2019),  1,200 mg (02/21/2019), 1,200 mg (03/14/2019), 1,200 mg (04/04/2019), 1,200 mg (04/25/2019), 1,200 mg (05/16/2019), 1,200 mg (06/09/2019)   for chemotherapy treatment.     06/30/2019 -  Chemotherapy   The patient had nivolumab (OPDIVO) 240 mg in sodium chloride 0.9 % 100 mL chemo infusion, 240 mg, Intravenous, Once, 22 of 23 cycles Administration: 240 mg (06/30/2019), 240 mg (07/14/2019), 240 mg (07/28/2019), 240 mg (08/11/2019), 240 mg (08/25/2019), 240 mg (09/08/2019), 240 mg (09/22/2019), 240 mg (11/03/2019), 240 mg (11/17/2019), 240 mg (12/10/2019), 240 mg (12/24/2019), 240 mg (01/08/2020), 240 mg (01/22/2020), 240 mg (02/05/2020), 240 mg (02/19/2020), 240 mg (03/04/2020), 240 mg (03/18/2020), 240 mg (04/01/2020), 240 mg (04/29/2020), 240 mg (05/13/2020), 240 mg (05/27/2020), 240 mg (06/17/2020)   for chemotherapy treatment.      HISTORY OF PRESENTING ILLNESS: Patient ambulating independently.  Accompanied by his son. [name] 87 y.o.  male with metastatic transitional carcinoma of the bladder most recently on Opdivo [currently on hold because of poor tolerance] is here for follow-up/ review the results of the CT scan  In the interim evaluated by pulmonary.  Denies any weight loss.  Continues to be on prednisone 5 mg a day.  Appetite is good.  Gaining weight.  No new shortness of breath or cough.   Review of Systems  Constitutional:  Positive for malaise/fatigue. Negative for chills, diaphoresis and fever.  HENT:  Negative for nosebleeds and sore throat.   Eyes:  Negative for double vision.  Respiratory:  Negative for hemoptysis, sputum production and wheezing.   Cardiovascular:  Negative for chest pain, palpitations, orthopnea and leg swelling.  Gastrointestinal:  Negative for abdominal pain, blood in stool, diarrhea, heartburn, melena, nausea and vomiting.  Genitourinary:  Negative for dysuria.  Musculoskeletal:  Positive for back pain and joint pain.  Skin: Negative.  Negative for itching and rash.   Neurological:  Negative for tingling, focal weakness and headaches.  Endo/Heme/Allergies:  Does not bruise/bleed easily.  Psychiatric/Behavioral:  Negative for depression. The patient is not nervous/anxious and does not have insomnia.     MEDICAL HISTORY:  Past Medical History:  Diagnosis Date   Anemia    Anxiety 12/31/2019   Cancer [facility])    bladder   Chronic kidney disease    Depression    Hypertension    Neuromuscular disorder ([place])    Nerve damage to left face/eye since around 09/06/00.    SURGICAL HISTORY: Past Surgical History:  Procedure Laterality Date   CYSTOSCOPY W/ RETROGRADES Bilateral 01/25/2018   Procedure: CYSTOSCOPY WITH RETROGRADE PYELOGRAM;  [name], MD;  [name];  Service: Urology;  Laterality: Bilateral;   CYSTOSCOPY W/ URETERAL STENT PLACEMENT Bilateral 01/06/2019   Procedure: CYSTOSCOPY WITH RETROGRADE PYELOGRAM/URETERAL STENT REMOVAL;  [name], MD;  [name];  Service: Urology;  Laterality: Bilateral;   CYSTOSCOPY WITH BIOPSY N/A 09/21/2020   Procedure: CYSTOSCOPY WITH BLADDER BIOPSY;  [name], MD;  [name];  Service: Urology;  [name];   CYSTOSCOPY WITH FULGERATION N/A 09/21/2020   Procedure: CYSTOSCOPY WITH FULGERATION;  [name], MD;  [name];  Service: Urology;  [name];   CYSTOSCOPY WITH STENT PLACEMENT Bilateral 01/25/2018   Procedure: CYSTOSCOPY WITH STENT PLACEMENT;  [name], MD;  [name];  Service: Urology;  Laterality: Bilateral;   DORSAL SLIT N/A 01/06/2019   Procedure: DORSAL SLIT;  [name], MD;  [name];  Service: Urology;  [name];   ESOPHAGOGASTRODUODENOSCOPY (EGD) WITH PROPOFOL N/A 11/12/2019   Procedure: ESOPHAGOGASTRODUODENOSCOPY (EGD) WITH PROPOFOL;  Surgeon: Virgel Manifold, MD;  Location: [facility];  Service: Endoscopy;  [name];   EYE  SURGERY     Cornea transplants bilaterally & cataract surgery.   TRANSURETHRAL RESECTION OF BLADDER TUMOR N/A 01/25/2018   Procedure: TRANSURETHRAL RESECTION OF BLADDER TUMOR (TURBT);  [name], MD;  [name];  Service: Urology;  [name];    SOCIAL HISTORY: Social History   Socioeconomic History   Marital status: Widowed    Spouse name: [name]   Number of children: 2   Years of education: 6th grade   Highest education level: 6th grade  Occupational History   Not on file  Tobacco Use   Smoking status: Former    Types: Cigarettes   Smokeless tobacco: Current    Types: Chew   Tobacco comments:    Stopped approximately 10 years ago.  Vaping Use   Vaping Use: Never used  Substance and Sexual Activity   Alcohol use: Yes    Alcohol/week: 2.0 standard drinks    Types: 2 Cans of beer per week    Comment: occasionally   Drug use: Never   Sexual activity: Yes    Birth control/protection: None  Other Topics Concern   Not on file  Social History Narrative    lives in [place]; with oldest son. Wife died in 2018-09-07.  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 Pt still drives and is indepent with no serious chronic disease. Worked in [employer] Strain: Not on file  Food Insecurity: Not on file  Transportation Needs: Not on file  Physical Activity:  Not on file  Stress: Not on file  Social Connections: Not on file  Intimate Partner Violence: Not on file    FAMILY HISTORY: Family History  Problem Relation Age of Onset   Prostate cancer Neg Hx    Kidney cancer Neg Hx    Bladder Cancer Neg Hx     ALLERGIES:  has No Known Allergies.  MEDICATIONS:  Current Outpatient Medications  Medication Sig Dispense Refill   aspirin EC 81 MG tablet Take 81 mg by mouth daily.     loratadine (CLARITIN) 10 MG tablet Take 1 tablet (10 mg total) by  mouth daily. 30 tablet 11   mometasone (NASONEX) 50 MCG/ACT nasal spray Place 2 sprays into the nose daily. 1 each 12   Multiple Vitamin (MULTIVITAMIN WITH MINERALS) TABS tablet Take 1 tablet by mouth daily.      prednisoLONE acetate (PRED FORTE) 1 % ophthalmic suspension SMARTSIG:In Eye(s)     levothyroxine (SYNTHROID) 88 MCG tablet TAKE 1 TABLET BY MOUTH ONCE A DAY. TAKE ON AN EMPTY STOMACH WITH A GLASS OF WATER ATLEAST 30-60 MIN BEFORE BREAKFAST 90 tablet 3   predniSONE (DELTASONE) 5 MG tablet Take 1 tablet (5 mg total) by mouth daily with breakfast. 60 tablet 0   No current facility-administered medications for this visit.      Marland Kitchen  PHYSICAL EXAMINATION: ECOG PERFORMANCE STATUS: 1 - Symptomatic but completely ambulatory  Vitals:   02/16/21 1107  BP: (!) 146/62  Pulse: (!) 59  Resp: 16  Temp: (!) 96.3 F (35.7 C)  SpO2: 100%   Filed Weights   02/16/21 1107  Weight: 131 lb (59.4 kg)    Physical Exam HENT:     Head: Normocephalic and atraumatic.     Mouth/Throat:     Pharynx: No oropharyngeal exudate.  Eyes:     Pupils: Pupils are equal, round, and reactive to light.     Comments: Chronic drooping of the left eyelid.  Cardiovascular:     Rate and Rhythm: Normal rate and regular rhythm.  Pulmonary:     Effort: No respiratory distress.     Breath sounds: No wheezing.  Abdominal:     General: Bowel sounds are normal. There is no distension.     Palpations: Abdomen is soft. There is no mass.     Tenderness: There is no abdominal tenderness. There is no guarding or rebound.  Musculoskeletal:        General: No tenderness. Normal range of motion.     Cervical back: Normal range of motion and neck supple.  Skin:    General: Skin is warm.  Neurological:     Mental Status: He is alert and oriented to person, place, and time.  Psychiatric:        Mood and Affect: Affect normal.     LABORATORY DATA:  I have reviewed the data as listed Lab Results  Component Value  Date   WBC 11.0 (H) 02/16/2021   HGB 11.1 (L) 02/16/2021   HCT 34.1 (L) 02/16/2021   MCV 103.3 (H) 02/16/2021   PLT 220 02/16/2021   Recent Labs    02/19/20 0813 03/04/20 0801 03/18/20 0819 12/15/20 1025 01/14/21 1251 02/16/21 0948  NA 135 137   < > 138 136 137  K 4.7 4.7   < > 4.4 4.7 3.7  CL 106 107   < > 107 107 108  CO2 21* 23   < > _0 GLUCOSE 101* 98   < >  104* 92 93  BUN 34* 33*   < > 33* 43* 34*  CREATININE 2.24* 2.29*   < > 2.02* 2.31* 2.13*  CALCIUM 8.9 8.9   < > 9.2 9.1 9.0  GFRNONAA 26* 25*   < > 32* 27* 30*  GFRAA 30* 29*  --   --   --   --   PROT 7.0 7.0   < > 6.7 7.1 6.6  ALBUMIN 3.9 4.0   < > 3.5 3.9 3.6  AST 17 18   < > _0 ALT 12 13   < > _1 ALKPHOS 70 65   < > 66 63 59  BILITOT 0.8 0.6   < > 0.6 0.6 0.4   < > = values in this interval not displayed.    RADIOGRAPHIC STUDIES: I have personally reviewed the radiological images as listed and agreed with the findings in the report. CT Abdomen Pelvis Wo Contrast  Result Date: 02/16/2021 CLINICAL DATA:  Follow-up bladder cancer, status post TURBT and chemotherapy, former smoker EXAM: CT CHEST, ABDOMEN AND PELVIS WITHOUT CONTRAST TECHNIQUE: Multidetector CT imaging of the chest, abdomen and pelvis was performed following the standard protocol without IV contrast. Oral enteric contrast was administered COMPARISON:  CT chest, 08/30/2020, CT chest abdomen pelvis, 04/12/2020 FINDINGS: CT CHEST FINDINGS Cardiovascular: Aortic atherosclerosis. Normal heart size. Three-vessel coronary artery calcifications. No pericardial effusion. Mediastinum/Nodes: No enlarged mediastinal, hilar, or axillary lymph nodes. Small hiatal hernia. Thyroid gland, trachea, and esophagus demonstrate no significant findings. Lungs/Pleura: Moderate centrilobular emphysema. Diffuse bilateral bronchial wall thickening. Scarring and or fibrosis in the right greater than left lung bases, findings characterized by subpleural  bronchiolectasis in the costophrenic recesses and irregular peripheral ground-glass opacity in the dependent bilateral lung bases, with arcing subpleural elements and evidence of subpleural sparing (series 3, image 97, 134). Calcified bilateral pleural plaques. Musculoskeletal: No chest wall mass or suspicious bone lesions identified. CT ABDOMEN PELVIS FINDINGS Hepatobiliary: No solid liver abnormality is seen. No gallstones, gallbladder wall thickening, or biliary dilatation. Pancreas: Unremarkable. No pancreatic ductal dilatation or surrounding inflammatory changes. Spleen: Normal in size without significant abnormality. Adrenals/Urinary Tract: Adrenal glands are unremarkable. Kidneys are normal, without renal calculi, solid lesion, or hydronephrosis. Diffusely thickened, decompressed urinary bladder. No obvious recurrent bladder mass on noncontrast CT. Stomach/Bowel: Stomach is within normal limits. Appendix appears normal. No evidence of bowel wall thickening, distention, or inflammatory changes. Vascular/Lymphatic: Aortic atherosclerosis. No enlarged abdominal or pelvic lymph nodes. Reproductive: Prostatomegaly. Other: No abdominal wall hernia or abnormality. No abdominopelvic ascites. Musculoskeletal: No acute or significant osseous findings. IMPRESSION: 1. Diffusely thickened, decompressed urinary bladder, this appearance most likely related to chronic bladder outlet obstruction. No obvious recurrent bladder mass on noncontrast CT. 2. No noncontrast evidence of lymphadenopathy or metastatic disease in the chest, abdomen, or pelvis. 3. Emphysema and diffuse bilateral bronchial wall thickening. 4. Scarring and or fibrosis in the right greater than left lung bases, findings characterized by subpleural bronchiolectasis in the costophrenic recesses and irregular peripheral ground-glass opacity in the dependent bilateral lung bases, with arcing subpleural elements and evidence of subpleural sparing. Findings most  likely reflect sequelae of prior infection or inflammation, however fibrotic interstitial lung disease remains a differential consideration. 5. Calcified bilateral pleural plaques, consistent with prior asbestos exposure. 6. Coronary artery disease. Aortic Atherosclerosis (ICD10-I70.0) and Emphysema (ICD10-J43.9). Electronically Signed   By: Eddie Candle M.D.   On: 02/16/2021 10:42   CT Chest Wo Contrast  Result Date:  02/16/2021 CLINICAL DATA:  Follow-up bladder cancer, status post TURBT and chemotherapy, former smoker EXAM: CT CHEST, ABDOMEN AND PELVIS WITHOUT CONTRAST TECHNIQUE: Multidetector CT imaging of the chest, abdomen and pelvis was performed following the standard protocol without IV contrast. Oral enteric contrast was administered COMPARISON:  CT chest, 08/30/2020, CT chest abdomen pelvis, 04/12/2020 FINDINGS: CT CHEST FINDINGS Cardiovascular: Aortic atherosclerosis. Normal heart size. Three-vessel coronary artery calcifications. No pericardial effusion. Mediastinum/Nodes: No enlarged mediastinal, hilar, or axillary lymph nodes. Small hiatal hernia. Thyroid gland, trachea, and esophagus demonstrate no significant findings. Lungs/Pleura: Moderate centrilobular emphysema. Diffuse bilateral bronchial wall thickening. Scarring and or fibrosis in the right greater than left lung bases, findings characterized by subpleural bronchiolectasis in the costophrenic recesses and irregular peripheral ground-glass opacity in the dependent bilateral lung bases, with arcing subpleural elements and evidence of subpleural sparing (series 3, image 97, 134). Calcified bilateral pleural plaques. Musculoskeletal: No chest wall mass or suspicious bone lesions identified. CT ABDOMEN PELVIS FINDINGS Hepatobiliary: No solid liver abnormality is seen. No gallstones, gallbladder wall thickening, or biliary dilatation. Pancreas: Unremarkable. No pancreatic ductal dilatation or surrounding inflammatory changes. Spleen: Normal in  size without significant abnormality. Adrenals/Urinary Tract: Adrenal glands are unremarkable. Kidneys are normal, without renal calculi, solid lesion, or hydronephrosis. Diffusely thickened, decompressed urinary bladder. No obvious recurrent bladder mass on noncontrast CT. Stomach/Bowel: Stomach is within normal limits. Appendix appears normal. No evidence of bowel wall thickening, distention, or inflammatory changes. Vascular/Lymphatic: Aortic atherosclerosis. No enlarged abdominal or pelvic lymph nodes. Reproductive: Prostatomegaly. Other: No abdominal wall hernia or abnormality. No abdominopelvic ascites. Musculoskeletal: No acute or significant osseous findings. IMPRESSION: 1. Diffusely thickened, decompressed urinary bladder, this appearance most likely related to chronic bladder outlet obstruction. No obvious recurrent bladder mass on noncontrast CT. 2. No noncontrast evidence of lymphadenopathy or metastatic disease in the chest, abdomen, or pelvis. 3. Emphysema and diffuse bilateral bronchial wall thickening. 4. Scarring and or fibrosis in the right greater than left lung bases, findings characterized by subpleural bronchiolectasis in the costophrenic recesses and irregular peripheral ground-glass opacity in the dependent bilateral lung bases, with arcing subpleural elements and evidence of subpleural sparing. Findings most likely reflect sequelae of prior infection or inflammation, however fibrotic interstitial lung disease remains a differential consideration. 5. Calcified bilateral pleural plaques, consistent with prior asbestos exposure. 6. Coronary artery disease. Aortic Atherosclerosis (ICD10-I70.0) and Emphysema (ICD10-J43.9). Electronically Signed   By: Eddie Candle M.D.   On: 02/16/2021 10:42     ASSESSMENT & PLAN:   Cancer of overlapping sites of bladder ([place]) # High-grade transitional cell carcinoma of the bladder metastatic to retroperitoneal lymph node.  Stage IV-SEP 6th, 2022- CT CAP-no  evidence of any recurrent disease.  Continue surveillance off therapy with imaging every 6 months or so.   # Local recurrent bladder ca < 1cm [Dr.Stoiof March 2022]- agree with TURBT- STABLE.    # Pulmonary: SEP 2022- CT-emphysema /bronchial thickening /bilateral basilar groundglass opacities -?  Resolving  BOOP/ fibrosis.  Discussed with Dr. Lanney Gins.  Recommend patient follow-up with pulmonary.   # Iatrogenic hypothyroidism-on Synthroid 88 mcg; TSH JULY 2022- WNL-stable treatment.  # Anemia sec to CKD-III-IV/ on Iv iron.Hb-11-12-Clinically- STABLE; Hold Venofer today.  # CKD stageIII-GFR- 28-30-Clinically  STABLE.    #Weight loss improving; continue prednisone for appetite/also above pneumonitis.  New refill   # DISPOSITION:  # HOLD venofer  # Follow-up in  2 month MD labs-cbc/cmp;iron studies/ferritin/possible venofer; Dr.B  # I reviewed the blood work- with the patient in  detail; also reviewed the imaging independently [as summarized above]; and with the patient in detail.       All questions were answered. The patient knows to call the clinic with any problems, questions or concerns.    Cammie Sickle, MD 02/16/2021 1:24 PM

## 2021-02-21 ENCOUNTER — Telehealth: Payer: Self-pay | Admitting: *Deleted

## 2021-02-21 NOTE — Telephone Encounter (Signed)
I have placed a call to [facility] after speaking with Ronalee Belts who said they told hime they had nothing from [place].I had to leave a message to call me bad regarding this

## 2021-02-21 NOTE — Telephone Encounter (Signed)
If [facility] doesn't have the script, please resubmit it.

## 2021-02-21 NOTE — Telephone Encounter (Signed)
Son called stating that medicine was to have been sent in for patient las t week after his appointment and that nothing has been sent. Maybe Prednisone? Please advise  ASSESSMENT & PLAN:    Cancer of overlapping sites of bladder ([place]) # High-grade transitional cell carcinoma of the bladder metastatic to retroperitoneal lymph node.  Stage IV-SEP 6th, 2022- CT CAP-no evidence of any recurrent disease.  Continue surveillance off therapy with imaging every 6 months or so.    # Local recurrent bladder ca < 1cm [Dr.Stoiof March 2022]- agree with TURBT- STABLE.     # Pulmonary: SEP 2022- CT-emphysema /bronchial thickening /bilateral basilar groundglass opacities -?  Resolving  BOOP/ fibrosis.  Discussed with Dr. Lanney Gins.  Recommend patient follow-up with pulmonary.   # Iatrogenic hypothyroidism-on Synthroid 88 mcg; TSH JULY 2022- WNL-stable treatment.   # Anemia sec to CKD-III-IV/ on Iv iron.Hb-11-12-Clinically- STABLE; Hold Venofer today.   # CKD stageIII-GFR- 28-30-Clinically  STABLE.     #Weight loss improving; continue prednisone for appetite/also above pneumonitis.  New refill    # DISPOSITION:  # HOLD venofer  # Follow-up in  2 month MD labs-cbc/cmp;iron studies/ferritin/possible venofer; Dr.B   # I reviewed the blood work- with the patient in detail; also reviewed the imaging independently [as summarized above]; and with the patient in detail.          All questions were answered. The patient knows to call the clinic with any problems, questions or concerns.    Cammie Sickle, MD 02/16/2021 1:24 PM

## 2021-02-21 NOTE — Telephone Encounter (Signed)
[facility]- Dr. B sent prednisone prescription to the pharmacy on 9/7. Please have pt's son - call the pharmacy.  predniSONE (DELTASONE) 5 MG tablet 60 tablet 0 02/16/2021    Sig - Route: Take 1 tablet (5 mg total) by mouth daily with breakfast. - Oral   Sent to pharmacy as: predniSONE (DELTASONE) 5 MG tablet   E-Prescribing Status: Receipt confirmed by pharmacy (02/16/2021 11:39 AM EDT)

## 2021-02-21 NOTE — Telephone Encounter (Signed)
I spoke with Gerald Stabs at [facility] who said that they have patient prescription on file because it is too early to refill, not due until 03/03/21 and that the other pharmacist [facility] had told them this information when they came by to get prescription this morning. I then called Ronalee Belts and explained to him and he admitted that they had been told this and will check on pill count patient has on hand and let us know if needed any sooner

## 2021-02-23 DIAGNOSIS — J8489 Other specified interstitial pulmonary diseases: Secondary | ICD-10-CM | POA: Diagnosis not present

## 2021-03-02 ENCOUNTER — Ambulatory Visit: Payer: Medicare HMO | Admitting: Internal Medicine

## 2021-03-08 ENCOUNTER — Other Ambulatory Visit: Payer: Self-pay | Admitting: *Deleted

## 2021-03-08 ENCOUNTER — Other Ambulatory Visit: Payer: Self-pay

## 2021-03-08 NOTE — Patient Outreach (Signed)
[facility]) Care Management  03/08/2021  Edward Cummings Aug 16, 1932 [id number]  [facility] outreach follow up with complex care patient   Edward Cummings as re assigned to this RN CM on 12/08/20 after referred to [facility] on 11/26/20 for complex care services/ [employer] initiative. Edward Cummings was noted with 1 ED visit/COPD/HTN     Edward Cummings is able to verify HIPAA identifiers     Follow up assessment Edward Cummings reports he is doing well today  He denies any medical concerns  He reports a weight of 140 lbs He denies worsening symptoms of edema, elevated Blood pressure, fatigue He is not noted with any audible congestion today He reports changes in his medicines and home management with improvements Progression of care to [facility] disease management agreed upon Patient Active Problem List   Diagnosis Date Noted   Suspected COVID-19 virus infection 03/21/2021   Rhinorrhea 10/14/2020   Hernia, inguinal, left 08/25/2020   Stage 3a chronic kidney disease ([place]) 05/24/2020   Cervicogenic headache 05/24/2020   Tobacco abuse counseling 04/05/2020   Urothelial carcinoma of bladder ([place]) 02/25/2020   Cerumen debris on tympanic membrane of both ears 12/31/2019   Seasonal allergic rhinitis due to pollen 12/31/2019   Anxiety 12/31/2019   Annual physical exam 07/25/2018   Syncope 04/13/2018   Cancer of overlapping sites of bladder ([place]) 02/04/2018   Anemia due to stage 3 chronic kidney disease ([place]) 02/04/2018   AKI (acute kidney injury) ([place]) 12/12/2017   Hypertension 11/19/2013   Fuchs' corneal dystrophy 10/10/2013   Past Medical History:  Diagnosis Date   Anemia    Anxiety 12/31/2019   Cancer ([place])    bladder   Chronic kidney disease    Depression    Hypertension    Neuromuscular disorder ([place])    Nerve damage to left face/eye since around 2002.   Plans  Patient agrees to care plan and referral to [facility] disease management  Goals Addressed               This Visit's Progress      Patient Stated     COMPLETED: Eat Healthy as evidenced by wt gain over the next 3 months. [facility]) (pt-stated)        Timeframe:  Long-Range Goal Priority:  High Start Date:    12/29/18                         Expected End Date:     04/10/21                   Barriers: Health Behaviors     - set goal weight - drink 6 to 8 glasses of water each day   Notes:  03/08/21 wt 140 lb improvements goal completed 02/15/21 Appetite remains good Has not weighed today 01/12/21 He reports his appetite remains good and he is eating 3 times a day He notes an improvement in his weight of today at 126 lbs (had been down to 120 lbs) 12/28/20 Pt doesn't have any dietary restrictions, he eats what he wants. He had a considerable wt loss last year and is now gaining back current wt is 125#, Ht is 5'9" and his BMI is 18.46. Would like to see pt gain an additional 5 pounds to 130# over the next 3 months.He is drinking a nutritional supplement occasionally. Encourage him to drink one daily and a balanced diet.      Learn  More About My Health [facility]) (pt-stated)   On track     Timeframe:  Long-Range Goal Priority:  Medium Start Date:                        02/15/21     Expected End Date:          06/11/21              Follow Up Date pending DM outreach   Barriers: Knowledge  - make a list of questions - ask questions     Notes:  03/08/21 Progression of care to [facility] disease management agreed upon for further education on CKD, HTN 02/15/21 asked questions about post nasal drainage, nasonex      Track and Manage My Blood Pressure-Hypertension ([facility]) (pt-stated)   On track     Timeframe:  Long-Range Goal Priority:  High Start Date:                   01/12/21          Expected End Date:       06/10/21                Follow Up Date pending DM outreach Barriers: Knowledge    - check blood pressure weekly     Notes:  03/08/21 Doing well no reported worsening symptoms 02/15/21 No worsening symptoms BP managed well  at home  01/12/21 Assessed for any worsening symptoms He denies headaches and chest pain but reports some shortness of breath (sob) when he is in heat       COMPLETED: Will attend appointments with primary care and specialty MDs as scheduled over the next 3 months.([facility]) (pt-stated)   On track     Start date 12/28/20 High priority Long term goal Expected end date 04/11/21 Barriers: Health Behaviors  Notes.  03/08/21 goal met  02/15/21 Pt continues to attend scheduled appointments. Attending a CT head at 2 pm today ordered by oncologist 01/12/21 Completes all appointments  Put his appointments on his phone 12/28/20 Pt has a good record of attending appts. He can drive and his family supervising him and keeps up with his medical care. He has a PE on 12/30/20 and follow up with oncology for chemotherapy in August.      ;   Mikiala Fugett L. Lavina Hamman, RN, BSN, [place] Coordinator Office number [phone] [facility] number [phone] Fax number [phone]

## 2021-03-21 ENCOUNTER — Ambulatory Visit (INDEPENDENT_AMBULATORY_CARE_PROVIDER_SITE_OTHER): Payer: Medicare HMO | Admitting: Internal Medicine

## 2021-03-21 ENCOUNTER — Other Ambulatory Visit: Payer: Self-pay

## 2021-03-21 ENCOUNTER — Encounter: Payer: Self-pay | Admitting: Internal Medicine

## 2021-03-21 VITALS — BP 127/69 | HR 72 | Ht 68.0 in | Wt 130.4 lb

## 2021-03-21 DIAGNOSIS — F419 Anxiety disorder, unspecified: Secondary | ICD-10-CM

## 2021-03-21 DIAGNOSIS — Z20822 Contact with and (suspected) exposure to covid-19: Secondary | ICD-10-CM | POA: Insufficient documentation

## 2021-03-21 DIAGNOSIS — N1831 Chronic kidney disease, stage 3a: Secondary | ICD-10-CM

## 2021-03-21 DIAGNOSIS — Z716 Tobacco abuse counseling: Secondary | ICD-10-CM | POA: Diagnosis not present

## 2021-03-21 DIAGNOSIS — D631 Anemia in chronic kidney disease: Secondary | ICD-10-CM | POA: Diagnosis not present

## 2021-03-21 DIAGNOSIS — G4486 Cervicogenic headache: Secondary | ICD-10-CM | POA: Diagnosis not present

## 2021-03-21 DIAGNOSIS — R69 Illness, unspecified: Secondary | ICD-10-CM | POA: Diagnosis not present

## 2021-03-21 DIAGNOSIS — I10 Essential (primary) hypertension: Secondary | ICD-10-CM

## 2021-03-21 LAB — POC COVID19 BINAXNOW: SARS Coronavirus 2 Ag: NEGATIVE

## 2021-03-21 NOTE — Assessment & Plan Note (Signed)
Quit smoking. 

## 2021-03-21 NOTE — Assessment & Plan Note (Signed)
No focal neurological sign no rashes noted neck is supple gait is normal

## 2021-03-21 NOTE — Assessment & Plan Note (Signed)
stable °

## 2021-03-21 NOTE — Assessment & Plan Note (Signed)
-   Patient experiencing high levels of anxiety.  - Encouraged patient to engage in relaxing activities like yoga, meditation, journaling, going for a walk, or participating in a hobby.  - Encouraged patient to reach out to trusted friends or family members about recent struggles, Patient was advised to read A book, how to stop worrying and start living, it is good book to read to control  the stress  

## 2021-03-21 NOTE — Progress Notes (Signed)
Established Patient Office Visit  Subjective:  Patient ID: Edward Cummings, male    DOB: Dec 29, 1932  Age: 87 y.o. MRN: [id number]  CC:  Chief Complaint  Patient presents with   pain in head    HPI  Edward Cummings presents for general check  Past Medical History:  Diagnosis Date   Anemia    Anxiety 12/31/2019   Cancer [facility])    bladder   Chronic kidney disease    Depression    Hypertension    Neuromuscular disorder ([place])    Nerve damage to left face/eye since around 29-Sep-2000.    Past Surgical History:  Procedure Laterality Date   CYSTOSCOPY W/ RETROGRADES Bilateral 01/25/2018   Procedure: CYSTOSCOPY WITH RETROGRADE PYELOGRAM;  [name], MD;  [name];  Service: Urology;  Laterality: Bilateral;   CYSTOSCOPY W/ URETERAL STENT PLACEMENT Bilateral 01/06/2019   Procedure: CYSTOSCOPY WITH RETROGRADE PYELOGRAM/URETERAL STENT REMOVAL;  [name], MD;  [name];  Service: Urology;  Laterality: Bilateral;   CYSTOSCOPY WITH BIOPSY N/A 09/21/2020   Procedure: CYSTOSCOPY WITH BLADDER BIOPSY;  [name], MD;  [name];  Service: Urology;  [name];   CYSTOSCOPY WITH FULGERATION N/A 09/21/2020   Procedure: CYSTOSCOPY WITH FULGERATION;  [name], MD;  [name];  Service: Urology;  [name];   CYSTOSCOPY WITH STENT PLACEMENT Bilateral 01/25/2018   Procedure: CYSTOSCOPY WITH STENT PLACEMENT;  [name], MD;  [name];  Service: Urology;  Laterality: Bilateral;   DORSAL SLIT N/A 01/06/2019   Procedure: DORSAL SLIT;  [name], MD;  [name];  Service: Urology;  [name];   ESOPHAGOGASTRODUODENOSCOPY (EGD) WITH PROPOFOL N/A 11/12/2019   Procedure: ESOPHAGOGASTRODUODENOSCOPY (EGD) WITH PROPOFOL;  Surgeon: Virgel Manifold, MD;  Location: [facility] ENDOSCOPY;  Service: Endoscopy;  [name];   EYE SURGERY     Cornea  transplants bilaterally & cataract surgery.   TRANSURETHRAL RESECTION OF BLADDER TUMOR N/A 01/25/2018   Procedure: TRANSURETHRAL RESECTION OF BLADDER TUMOR (TURBT);  [name], MD;  [name];  Service: Urology;  [name];    Family History  Problem Relation Age of Onset   Prostate cancer Neg Hx    Kidney cancer Neg Hx    Bladder Cancer Neg Hx     Social History   Socioeconomic History   Marital status: Widowed    Spouse name: [name]   Number of children: 2   Years of education: 6th grade   Highest education level: 6th grade  Occupational History   Not on file  Tobacco Use   Smoking status: Former    Types: Cigarettes   Smokeless tobacco: Current    Types: Chew   Tobacco comments:    Stopped approximately 10 years ago.  Vaping Use   Vaping Use: Never used  Substance and Sexual Activity   Alcohol use: Yes    Alcohol/week: 2.0 standard drinks    Types: 2 Cans of beer per week    Comment: occasionally   Drug use: Never   Sexual activity: Yes    Birth control/protection: None  Other Topics Concern   Not on file  Social History Narrative    lives in [place]; with oldest son. Wife died in September 30, 2018.  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 Pt still drives and is indepent with no serious chronic disease.  Worked in [place] Strain: Not on file  Food Insecurity: Not on file  Transportation Needs: Not on file  Physical Activity: Not on file  Stress: Not on file  Social Connections: Not on file  Intimate Partner Violence: Not on file     Current Outpatient Medications:    aspirin EC 81 MG tablet, Take 81 mg by mouth daily., Disp: , Rfl:    levothyroxine (SYNTHROID) 88 MCG tablet, TAKE 1 TABLET BY MOUTH ONCE A DAY. TAKE ON AN EMPTY STOMACH WITH A GLASS OF WATER ATLEAST 30-60 MIN BEFORE BREAKFAST, Disp: 90 tablet, Rfl: 3    loratadine (CLARITIN) 10 MG tablet, Take 1 tablet (10 mg total) by mouth daily., Disp: 30 tablet, Rfl: 11   mometasone (NASONEX) 50 MCG/ACT nasal spray, Place 2 sprays into the nose daily., Disp: 1 each, Rfl: 12   Multiple Vitamin (MULTIVITAMIN WITH MINERALS) TABS tablet, Take 1 tablet by mouth daily. , Disp: , Rfl:    prednisoLONE acetate (PRED FORTE) 1 % ophthalmic suspension, SMARTSIG:In Eye(s), Disp: , Rfl:    predniSONE (DELTASONE) 5 MG tablet, Take 1 tablet (5 mg total) by mouth daily with breakfast., Disp: 60 tablet, Rfl: 0   No Known Allergies  ROS Review of Systems  Constitutional: Negative.   HENT: Negative.    Eyes: Negative.   Respiratory: Negative.    Cardiovascular: Negative.   Gastrointestinal: Negative.   Endocrine: Negative.   Genitourinary: Negative.   Musculoskeletal: Negative.   Skin: Negative.   Allergic/Immunologic: Negative.   Neurological: Negative.   Hematological: Negative.   Psychiatric/Behavioral: Negative.    All other systems reviewed and are negative.    Objective:    Physical Exam Vitals reviewed.  Constitutional:      Appearance: Normal appearance.  HENT:     Mouth/Throat:     Mouth: Mucous membranes are moist.  Eyes:     Pupils: Pupils are equal, round, and reactive to light.  Neck:     Vascular: No carotid bruit.  Cardiovascular:     Rate and Rhythm: Normal rate and regular rhythm.     Pulses: Normal pulses.     Heart sounds: Normal heart sounds.  Pulmonary:     Effort: Pulmonary effort is normal.     Breath sounds: Normal breath sounds.  Abdominal:     General: Bowel sounds are normal.     Palpations: Abdomen is soft. There is no hepatomegaly, splenomegaly or mass.     Tenderness: There is no abdominal tenderness.     Hernia: No hernia is present.  Musculoskeletal:     Cervical back: Neck supple.     Right lower leg: No edema.     Left lower leg: No edema.  Skin:    Findings: No rash.  Neurological:     Mental Status: He  is alert and oriented to person, place, and time.     Motor: No weakness.  Psychiatric:        Mood and Affect: Mood normal.        Behavior: Behavior normal.    BP 127/69   Pulse 72   Ht 5\' 8"  (1.727 m)   Wt 130 lb 6.4 oz (59.1 kg)   BMI 19.83 kg/m  Wt Readings from Last 3 Encounters:  03/21/21 130 lb 6.4 oz (59.1 kg)  02/16/21 131 lb (59.4 kg)  01/14/21 129 lb (58.5 kg)     Health Maintenance Due  Topic Date Due   COVID-19 Vaccine (4 - Booster for Pfizer series) 06/01/2020   INFLUENZA VACCINE  Never done    There are no preventive care reminders to display for this patient.  Lab Results  Component Value Date   TSH 3.116 02/16/2021   Lab Results  Component Value Date   WBC 11.0 (H) 02/16/2021   HGB 11.1 (L) 02/16/2021   HCT 34.1 (L) 02/16/2021   MCV 103.3 (H) 02/16/2021   PLT 220 02/16/2021   Lab Results  Component Value Date   NA 137 02/16/2021   K 3.7 02/16/2021   CO2 24 02/16/2021   GLUCOSE 93 02/16/2021   BUN 34 (H) 02/16/2021   CREATININE 2.13 (H) 02/16/2021   BILITOT 0.4 02/16/2021   ALKPHOS 59 02/16/2021   AST 24 02/16/2021   ALT 21 02/16/2021   PROT 6.6 02/16/2021   ALBUMIN 3.6 02/16/2021   CALCIUM 9.0 02/16/2021   ANIONGAP 5 02/16/2021   No results found for: CHOL No results found for: HDL No results found for: LDLCALC No results found for: TRIG No results found for: CHOLHDL Lab Results  Component Value Date   HGBA1C 4.5 (L) 04/13/2018      Assessment & Plan:   Problem List Items Addressed This Visit       Cardiovascular and Mediastinum   Hypertension - Primary    stable        Genitourinary   Anemia due to stage 3 chronic kidney disease (HCC)    stable        Other   Anxiety    - Patient experiencing high levels of anxiety.  - Encouraged patient to engage in relaxing activities like yoga, meditation, journaling, going for a walk, or participating in a hobby.  - Encouraged patient to reach out to trusted friends or  family members about recent struggles, Patient was advised to read A book, how to stop worrying and start living, it is good book to read to control  the stress       Tobacco abuse counseling    Quit smoking      Cervicogenic headache    No focal neurological sign no rashes noted neck is supple gait is normal      Suspected COVID-19 virus infection   Relevant Orders   POC COVID-19 (Completed)    No orders of the defined types were placed in this encounter.   Follow-up: No follow-ups on file.    [name], MD

## 2021-03-30 ENCOUNTER — Other Ambulatory Visit: Payer: Medicare HMO | Admitting: Urology

## 2021-04-08 ENCOUNTER — Other Ambulatory Visit: Payer: Medicare HMO | Admitting: Urology

## 2021-04-08 NOTE — Progress Notes (Deleted)
   04/08/21  CC: No chief complaint on file.  Indications: # AUG 2019-TRANSITIONAL CELL BLADDER CA [~ 4cm tumor] s/p cystoscopy [Dr.Stoiff]  with extensive angiolymphatic invasion; [name]; [name]; mediastinal uptake; right pubic rami uptake.   # [name]; Jan 18th 2021- switched to [place]- [pt preference; q2W]  #Bladder biopsy/fulguration/12/22 posterior wall erythema with early papillary change.  Path reactive lymphoid aggregates, inflammation with reactive epithelial change; negative for malignancy   HPI:  Refer to rooming tab for vital signs NED. A&Ox3.   No respiratory distress   Abd soft, NT, ND Normal phallus with bilateral descended testicles  Cystoscopy Procedure Note  Patient identification was confirmed, informed consent was obtained, and patient was prepped using Betadine solution.  Lidocaine jelly was administered per urethral meatus.     Pre-Procedure: - Inspection reveals a normal caliber ureteral meatus.  Procedure: The flexible cystoscope was introduced without difficulty - No urethral strictures/lesions are present. - {Blank multiple:19197::"Enlarged","Surgically absent","Normal"} prostate *** - {Blank multiple:19197::"Normal","Elevated","Tight"} bladder neck - Bilateral ureteral orifices identified - Bladder mucosa  reveals no ulcers, tumors, or lesions - No bladder stones - No trabeculation  Retroflexion shows ***   Post-Procedure: - Patient tolerated the procedure well  Assessment/ Plan:   No follow-ups on file.  Abbie Sons, MD

## 2021-04-13 ENCOUNTER — Other Ambulatory Visit: Payer: Self-pay | Admitting: *Deleted

## 2021-04-20 ENCOUNTER — Inpatient Hospital Stay: Payer: Medicare HMO | Attending: Internal Medicine

## 2021-04-20 ENCOUNTER — Inpatient Hospital Stay: Payer: Medicare HMO

## 2021-04-20 ENCOUNTER — Other Ambulatory Visit: Payer: Self-pay

## 2021-04-20 ENCOUNTER — Inpatient Hospital Stay: Payer: Medicare HMO | Admitting: Internal Medicine

## 2021-04-20 DIAGNOSIS — N183 Chronic kidney disease, stage 3 unspecified: Secondary | ICD-10-CM | POA: Diagnosis not present

## 2021-04-20 DIAGNOSIS — Z87891 Personal history of nicotine dependence: Secondary | ICD-10-CM | POA: Insufficient documentation

## 2021-04-20 DIAGNOSIS — D631 Anemia in chronic kidney disease: Secondary | ICD-10-CM | POA: Insufficient documentation

## 2021-04-20 DIAGNOSIS — Z9221 Personal history of antineoplastic chemotherapy: Secondary | ICD-10-CM | POA: Diagnosis not present

## 2021-04-20 DIAGNOSIS — Z79899 Other long term (current) drug therapy: Secondary | ICD-10-CM | POA: Diagnosis not present

## 2021-04-20 DIAGNOSIS — C678 Malignant neoplasm of overlapping sites of bladder: Secondary | ICD-10-CM | POA: Diagnosis not present

## 2021-04-20 DIAGNOSIS — E039 Hypothyroidism, unspecified: Secondary | ICD-10-CM | POA: Insufficient documentation

## 2021-04-20 DIAGNOSIS — Z8551 Personal history of malignant neoplasm of bladder: Secondary | ICD-10-CM | POA: Diagnosis not present

## 2021-04-20 LAB — CBC WITH DIFFERENTIAL/PLATELET
Abs Immature Granulocytes: 0.04 10*3/uL (ref 0.00–0.07)
Basophils Absolute: 0.1 10*3/uL (ref 0.0–0.1)
Basophils Relative: 1 %
Eosinophils Absolute: 0.9 10*3/uL — ABNORMAL HIGH (ref 0.0–0.5)
Eosinophils Relative: 8 %
HCT: 37 % — ABNORMAL LOW (ref 39.0–52.0)
Hemoglobin: 12.1 g/dL — ABNORMAL LOW (ref 13.0–17.0)
Immature Granulocytes: 0 %
Lymphocytes Relative: 28 %
Lymphs Abs: 3 10*3/uL (ref 0.7–4.0)
MCH: 32.6 pg (ref 26.0–34.0)
MCHC: 32.7 g/dL (ref 30.0–36.0)
MCV: 99.7 fL (ref 80.0–100.0)
Monocytes Absolute: 0.9 10*3/uL (ref 0.1–1.0)
Monocytes Relative: 8 %
Neutro Abs: 5.9 10*3/uL (ref 1.7–7.7)
Neutrophils Relative %: 55 %
Platelets: 225 10*3/uL (ref 150–400)
RBC: 3.71 MIL/uL — ABNORMAL LOW (ref 4.22–5.81)
RDW: 12.7 % (ref 11.5–15.5)
WBC: 10.8 10*3/uL — ABNORMAL HIGH (ref 4.0–10.5)
nRBC: 0 % (ref 0.0–0.2)

## 2021-04-20 LAB — COMPREHENSIVE METABOLIC PANEL
ALT: 24 U/L (ref 0–44)
AST: 24 U/L (ref 15–41)
Albumin: 4 g/dL (ref 3.5–5.0)
Alkaline Phosphatase: 62 U/L (ref 38–126)
Anion gap: 6 (ref 5–15)
BUN: 35 mg/dL — ABNORMAL HIGH (ref 8–23)
CO2: 27 mmol/L (ref 22–32)
Calcium: 9.2 mg/dL (ref 8.9–10.3)
Chloride: 104 mmol/L (ref 98–111)
Creatinine, Ser: 2.18 mg/dL — ABNORMAL HIGH (ref 0.61–1.24)
GFR, Estimated: 29 mL/min — ABNORMAL LOW (ref >60)
Glucose, Bld: 85 mg/dL (ref 70–99)
Potassium: 4.2 mmol/L (ref 3.5–5.1)
Sodium: 137 mmol/L (ref 135–145)
Total Bilirubin: 0.6 mg/dL (ref 0.3–1.2)
Total Protein: 6.6 g/dL (ref 6.5–8.1)

## 2021-04-20 LAB — IRON AND TIBC
Iron: 122 ug/dL (ref 45–182)
Saturation Ratios: 47 % — ABNORMAL HIGH (ref 17.9–39.5)
TIBC: 262 ug/dL (ref 250–450)
UIBC: 140 ug/dL

## 2021-04-20 LAB — FERRITIN: Ferritin: 712 ng/mL — ABNORMAL HIGH (ref 24–336)

## 2021-04-20 NOTE — Progress Notes (Signed)
[place] NOTE  Patient Care Team: [name], [name], [name], MD as Consulting Physician (Gastroenterology) Clyde Canterbury, MD as Referring Physician (Otolaryngology) Ottie Glazier, MD as Consulting Physician (Pulmonary Disease) Bernardo Heater Ronda Fairly, MD (Urology) Michiel Cowboy, RN as [place] Management  CHIEF COMPLAINTS/PURPOSE OF CONSULTATION: Bladder cancer   Oncology History Overview Note  # AUG 2019-TRANSITIONAL CELL BLADDER CA [~ 4cm tumor] s/p cystoscopy [Dr.Stoiff]  with extensive angiolymphatic invasion; [name]; [name]; mediastinal uptake; right pubic rami uptake.  # [id number] Cummings; Jan 18th 2021- switched to [place]- [pt preference; [name]; July 2020 cystoscopy-no evidence of bladder malignancy/Dr. Bernardo Heater- enlarged prostate [PSA- 0.95; 2021]  # Molecular testing- PDL-1 CPS- 20%; [name]  ;GOALS: palliative  CURRENT/MOST RECENT THERAPY:OPDIVO [C]     Cancer of overlapping sites of bladder ([place])  02/28/2018 - 06/09/2019 Chemotherapy   The patient had atezolizumab (TECENTRIQ) 1,[name], 1,200 mg, Intravenous, Once, [name],200 mg (02/28/2018), 1,200 mg (03/21/2018), 1,200 mg (04/11/2018), 1,200 mg (05/02/2018), 1,200 mg (06/13/2018), 1,200 mg (05/23/2018), 1,200 mg (07/04/2018), 1,200 mg (07/25/2018), 1,200 mg (08/15/2018), 1,200 mg (09/05/2018), 1,200 mg (10/25/2018), 1,200 mg (11/22/2018), 1,200 mg (12/20/2018), 1,200 mg (01/10/2019), 1,200 mg (01/31/2019), 1,200 mg  (02/21/2019), 1,200 mg (03/14/2019), 1,200 mg (04/04/2019), 1,200 mg (04/25/2019), 1,200 mg (05/16/2019), 1,200 mg (06/09/2019)   for chemotherapy treatment.     06/30/2019 -  Chemotherapy   The patient had nivolumab (OPDIVO) 240 mg in sodium chloride 0.9 % 100 mL chemo infusion, 240 mg, Intravenous, Once, 22 of 23 cycles Administration: 240 mg (06/30/2019), 240 mg (07/14/2019), 240 mg (07/28/2019), 240 mg (08/11/2019), 240 mg (08/25/2019), 240 mg (09/08/2019), 240 mg (09/22/2019), 240 mg (11/03/2019), 240 mg (11/17/2019), 240 mg (12/10/2019), 240 mg (12/24/2019), 240 mg (01/08/2020), 240 mg (01/22/2020), 240 mg (02/05/2020), 240 mg (02/19/2020), 240 mg (03/04/2020), 240 mg (03/18/2020), 240 mg (04/01/2020), 240 mg (04/29/2020), 240 mg (05/13/2020), 240 mg (05/27/2020), 240 mg (06/17/2020)   for chemotherapy treatment.      HISTORY OF PRESENTING ILLNESS: Patient ambulating independently.  Accompanied by his son.  [name] 87 y.o.  male with metastatic transitional carcinoma of the bladder most recently on Opdivo [currently on hold because of poor tolerance] is here for follow-up.  In the interim evaluated by pulmonary-given the concerns for a possible fungal infection on serologies.  Patient is currently awaiting to be evaluated by ID.  Denies any weight loss.  Continues to be on prednisone 5 mg a day.  Appetite is good.  Gaining weight.  No new shortness of breath or cough.   Review of Systems  Constitutional:  Positive for malaise/fatigue. Negative for chills, diaphoresis and fever.  HENT:  Negative for nosebleeds and sore throat.   Eyes:  Negative for double vision.  Respiratory:  Negative for hemoptysis, sputum production and wheezing.   Cardiovascular:  Negative for chest pain, palpitations, orthopnea and leg swelling.  Gastrointestinal:  Negative for abdominal pain, blood in stool, diarrhea, heartburn, melena, nausea and vomiting.  Genitourinary:  Negative for dysuria.  Musculoskeletal:  Positive for  back pain and joint pain.  Skin: Negative.  Negative for itching and rash.  Neurological:  Negative for tingling, focal weakness and headaches.  Endo/Heme/Allergies:  Does not bruise/bleed easily.  Psychiatric/Behavioral:  Negative for depression. The patient is not nervous/anxious and does not have insomnia.     MEDICAL HISTORY:  Past Medical History:  Diagnosis Date   Anemia    Anxiety 12/31/2019   Cancer [facility])    bladder   Chronic kidney disease    Depression    Hypertension    Neuromuscular disorder ([place])    Nerve damage to left face/eye since around Aug 21, 2000.    SURGICAL HISTORY: Past Surgical History:  Procedure Laterality Date   CYSTOSCOPY W/ RETROGRADES Bilateral 01/25/2018   Procedure: CYSTOSCOPY WITH RETROGRADE PYELOGRAM;  [name], MD;  [name];  Service: Urology;  Laterality: Bilateral;   CYSTOSCOPY W/ URETERAL STENT PLACEMENT Bilateral 01/06/2019   Procedure: CYSTOSCOPY WITH RETROGRADE PYELOGRAM/URETERAL STENT REMOVAL;  [name], MD;  [name];  Service: Urology;  Laterality: Bilateral;   CYSTOSCOPY WITH BIOPSY N/A 09/21/2020   Procedure: CYSTOSCOPY WITH BLADDER BIOPSY;  [name], MD;  [name];  Service: Urology;  [name];   CYSTOSCOPY WITH FULGERATION N/A 09/21/2020   Procedure: CYSTOSCOPY WITH FULGERATION;  [name], MD;  [name];  Service: Urology;  [name];   CYSTOSCOPY WITH STENT PLACEMENT Bilateral 01/25/2018   Procedure: CYSTOSCOPY WITH STENT PLACEMENT;  [name], MD;  [name];  Service: Urology;  Laterality: Bilateral;   DORSAL SLIT N/A 01/06/2019   Procedure: DORSAL SLIT;  [name], MD;  [name];  Service: Urology;  [name];   ESOPHAGOGASTRODUODENOSCOPY (EGD) WITH PROPOFOL N/A 11/12/2019   Procedure: ESOPHAGOGASTRODUODENOSCOPY (EGD) WITH PROPOFOL;  Surgeon: Virgel Manifold, MD;   Location: [facility];  Service: Endoscopy;  [name];   EYE SURGERY     Cornea transplants bilaterally & cataract surgery.   TRANSURETHRAL RESECTION OF BLADDER TUMOR N/A 01/25/2018   Procedure: TRANSURETHRAL RESECTION OF BLADDER TUMOR (TURBT);  [name], MD;  [name];  Service: Urology;  [name];    SOCIAL HISTORY: Social History   Socioeconomic History   Marital status: Widowed    Spouse name: [name]   Number of children: 2   Years of education: 6th grade   Highest education level: 6th grade  Occupational History   Not on file  Tobacco Use   Smoking status: Former    Types: Cigarettes   Smokeless tobacco: Current    Types: Chew   Tobacco comments:    Stopped approximately 10 years ago.  Vaping Use   Vaping Use: Never used  Substance and Sexual Activity   Alcohol use: Yes    Alcohol/week: 2.0 standard drinks    Types: 2 Cans of beer per week    Comment: occasionally   Drug use: Never   Sexual activity: Yes    Birth control/protection: None  Other Topics Concern   Not on file  Social History Narrative    lives in [place]; with oldest son. Wife died in August 21, 2018.  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 Pt still drives and is indepent with no serious chronic disease. Worked in [employer] Strain: Not on [employer]  Insecurity: Not on file  Transportation Needs: Not on file  Physical Activity: Not on file  Stress: Not on file  Social Connections: Not on file  Intimate Partner Violence: Not on file    FAMILY HISTORY: Family History  Problem Relation Age of Onset   Prostate cancer Neg Hx    Kidney cancer Neg Hx    Bladder Cancer Neg Hx     ALLERGIES:  has No Known Allergies.  MEDICATIONS:  Current Outpatient Medications  Medication Sig Dispense Refill   aspirin EC 81 MG tablet Take 81 mg by mouth daily.      levothyroxine (SYNTHROID) 88 MCG tablet TAKE 1 TABLET BY MOUTH ONCE A DAY. TAKE ON AN EMPTY STOMACH WITH A GLASS OF WATER ATLEAST 30-60 MIN BEFORE BREAKFAST 90 tablet 3   loratadine (CLARITIN) 10 MG tablet Take 1 tablet (10 mg total) by mouth daily. 30 tablet 11   Multiple Vitamin (MULTIVITAMIN WITH MINERALS) TABS tablet Take 1 tablet by mouth daily.      prednisoLONE acetate (PRED FORTE) 1 % ophthalmic suspension SMARTSIG:In Eye(s)     predniSONE (DELTASONE) 5 MG tablet Take 1 tablet (5 mg total) by mouth daily with breakfast. 60 tablet 0   mometasone (NASONEX) 50 MCG/ACT nasal spray Place 2 sprays into the nose daily. (Patient not taking: Reported on 04/20/2021) 1 each 12   No current facility-administered medications for this visit.      Marland Kitchen  PHYSICAL EXAMINATION: ECOG PERFORMANCE STATUS: 1 - Symptomatic but completely ambulatory  Vitals:   04/20/21 1058  BP: (!) 144/65  Pulse: 67  Resp: 20  Temp: (!) 96.7 F (35.9 C)   Filed Weights   04/20/21 1058  Weight: 131 lb (59.4 kg)    Physical Exam HENT:     Head: Normocephalic and atraumatic.     Mouth/Throat:     Pharynx: No oropharyngeal exudate.  Eyes:     Pupils: Pupils are equal, round, and reactive to light.     Comments: Chronic drooping of the left eyelid.  Cardiovascular:     Rate and Rhythm: Normal rate and regular rhythm.  Pulmonary:     Effort: No respiratory distress.     Breath sounds: No wheezing.  Abdominal:     General: Bowel sounds are normal. There is no distension.     Palpations: Abdomen is soft. There is no mass.     Tenderness: There is no abdominal tenderness. There is no guarding or rebound.  Musculoskeletal:        General: No tenderness. Normal range of motion.     Cervical back: Normal range of motion and neck supple.  Skin:    General: Skin is warm.  Neurological:     Mental Status: He is alert and oriented to person, place, and time.  Psychiatric:        Mood and Affect: Affect normal.      LABORATORY DATA:  I have reviewed the data as listed Lab Results  Component Value Date   WBC 10.8 (H) 04/20/2021   HGB 12.1 (L) 04/20/2021   HCT 37.0 (L) 04/20/2021   MCV 99.7 04/20/2021   PLT 225 04/20/2021   Recent Labs    01/14/21 1251 02/16/21 0948 04/20/21 1003  NA 136 137 137  K 4.7 3.7 4.2  CL 107 108 104  CO2 '23 24 27  ' GLUCOSE 92 93 85  BUN 43* 34* 35*  CREATININE 2.31* 2.13* 2.18*  CALCIUM 9.1 9.0 9.2  GFRNONAA 27*  30* 29*  PROT 7.1 6.6 6.6  ALBUMIN 3.9 3.6 4.0  AST '20 24 24  ' ALT '17 21 24  ' ALKPHOS 63 59 62  BILITOT 0.6 0.4 0.6    RADIOGRAPHIC STUDIES: I have personally reviewed the radiological images as listed and agreed with the findings in the report. No results found.   ASSESSMENT & PLAN:   Cancer of overlapping sites of bladder ([place]) # High-grade transitional cell carcinoma of the bladder metastatic to retroperitoneal lymph node.  Stage IV-SEP 6th, 2022- CT CAP-no evidence of any recurrent disease.  Continue surveillance off therapy with imaging every 6 months or so. STABLE.  We will order a scan in 3 months/ordered.  # Local recurrent bladder ca < 1cm [Dr.Stoiof March 2022]- agree with TURBT- STABLE.    # Pulmonary: SEP 2022- CT-emphysema /bronchial thickening /bilateral basilar groundglass opacities -?  Resolving  BOOP/ fibrosis Vs. fungal infection.  Await further evaluation with ID.  Discussed with Dr.Aleskerov.    # Iatrogenic hypothyroidism-on Synthroid 88 mcg; TSH JULY 2022- WNL-STABLE  # Anemia sec to CKD-III-IV/ on Iv iron.Hb-11-12-Clinically- STABLE; Hold Venofer today.  # CKD stageIII-GFR- 28-30-Clinically  STABLE    #Weight loss improving; continue prednisone for appetite/also above pneumonitis- 63m /day.   # DISPOSITION:  # HOLD venofer  # Follow-up in second  week of FEB 2023 labs-cbc/cmp;iron studies/ferritin/possible venofer; CT scan prior-; Dr.B       All questions were answered. The patient knows to call the clinic  with any problems, questions or concerns.    GCammie Sickle MD 04/20/2021 2:14 PM

## 2021-04-20 NOTE — Assessment & Plan Note (Addendum)
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  Stage IV-SEP 6th, 2022- CT CAP-no evidence of any recurrent disease.  Continue surveillance off therapy with imaging every 6 months or so. STABLE.  We will order a scan in 3 months/ordered.  # Local recurrent bladder ca < 1cm [Dr.Stoiof March 2022]- agree with TURBT- STABLE.    # Pulmonary: SEP 2022- CT-emphysema /bronchial thickening /bilateral basilar groundglass opacities -?  Resolving  BOOP/ fibrosis Vs. fungal infection.  Await further evaluation with ID.  Discussed with Dr.Aleskerov.   # Iatrogenic hypothyroidism-on Synthroid 88 mcg; TSH JULY 2022- WNL-STABLE  # Anemia sec to CKD-III-IV/ on Iv iron.Hb-11-12-Clinically- STABLE; Hold Venofer today.  # CKD stageIII-GFR- 28-30-Clinically  STABLE    #Weight loss improving; continue prednisone for appetite/also above pneumonitis- 5mg  /day.   # DISPOSITION:  # HOLD venofer  # Follow-up in second  week of FEB 2023 labs-cbc/cmp;iron studies/ferritin/possible venofer; CT scan prior-; Dr.B

## 2021-04-21 ENCOUNTER — Other Ambulatory Visit: Payer: Medicare HMO | Admitting: Urology

## 2021-04-25 ENCOUNTER — Other Ambulatory Visit: Payer: Self-pay

## 2021-04-25 ENCOUNTER — Encounter: Payer: Self-pay | Admitting: Urology

## 2021-04-25 ENCOUNTER — Ambulatory Visit: Payer: Medicare HMO | Admitting: Urology

## 2021-04-25 VITALS — BP 157/80 | HR 105 | Ht 68.0 in | Wt 131.0 lb

## 2021-04-25 DIAGNOSIS — C7910 Secondary malignant neoplasm of unspecified urinary organs: Secondary | ICD-10-CM | POA: Diagnosis not present

## 2021-04-25 DIAGNOSIS — D494 Neoplasm of unspecified behavior of bladder: Secondary | ICD-10-CM | POA: Diagnosis not present

## 2021-04-25 LAB — URINALYSIS, COMPLETE
Bilirubin, UA: NEGATIVE
Glucose, UA: NEGATIVE
Ketones, UA: NEGATIVE
Leukocytes,UA: NEGATIVE
Nitrite, UA: NEGATIVE
Protein,UA: NEGATIVE
RBC, UA: NEGATIVE
Specific Gravity, UA: 1.02 (ref 1.005–1.030)
Urobilinogen, Ur: 0.2 mg/dL (ref 0.2–1.0)
pH, UA: 5.5 (ref 5.0–7.5)

## 2021-04-25 LAB — MICROSCOPIC EXAMINATION
Bacteria, UA: NONE SEEN
RBC, Urine: NONE SEEN /hpf (ref 0–2)

## 2021-04-25 NOTE — Progress Notes (Signed)
   04/25/21  CC:  Chief Complaint  Patient presents with   Cysto    Indications: History of urothelial carcinoma the bladder status post TURBT 01/25/2018 for a 4 cm bladder mass and bilateral hydronephrosis.  He was noted to have moderate right hydronephrosis and left hydronephrosis and had bilateral stents placed.  He was subsequently found to have metastatic disease to the retroperitoneal lymph nodes and bulky retroperitoneal adenopathy as well as mediastinal uptake and uptake in the right pubic rami.  Treated with palliative therapy and oncology and did not follow-up with urology until 12/2018.  Cystoscopy with bilateral retrograde pyelograms showed no persistent hydronephrosis and stents were removed.  No evidence of urothelial recurrence  Underwent cystoscopy with bladder biopsies 09/2020 for mucosal erythema with pathology returning reactive lymphoid aggregates with inflammatory changes.  HPI: Edward Cummings has no complaints today.  He denies gross hematuria.  Blood pressure (!) 157/80, pulse (!) 105, height 5\' 8"  (1.727 m), weight 131 lb (59.4 kg). NED. A&Ox3.   No respiratory distress   Abd soft, NT, ND Normal phallus with bilateral descended testicles  Cystoscopy Procedure Note  Patient identification was confirmed, informed consent was obtained, and patient was prepped using Betadine solution.  Lidocaine jelly was administered per urethral meatus.     Pre-Procedure: - Inspection reveals a normal caliber urethral meatus.  Procedure: The flexible cystoscope was introduced without difficulty - No urethral strictures/lesions are present. -  Moderate lateral lobe enlargement  prostate  -  Moderate elevation  bladder neck - Bilateral ureteral orifices identified; right resected - Bladder mucosa  reveals no ulcers, tumors, or lesions - No bladder stones - No trabeculation  Retroflexion shows no abnormalities   Post-Procedure: - Patient tolerated the procedure  well  Assessment/ Plan: No mucosal abnormalities noted Surveillance cystoscopy 6 months   Edward Sons, MD

## 2021-05-10 ENCOUNTER — Other Ambulatory Visit: Payer: Self-pay | Admitting: Internal Medicine

## 2021-05-11 ENCOUNTER — Encounter: Payer: Self-pay | Admitting: Internal Medicine

## 2021-05-13 ENCOUNTER — Other Ambulatory Visit: Payer: Self-pay | Admitting: *Deleted

## 2021-05-13 NOTE — Patient Outreach (Addendum)
[facility]) Care Management  05/13/2021  WILLEY DUE 08-06-1932 [id number] Outreach attempt to patient. No answer and unable to leave voicemail message due to phone picked up and stated we are not able to take your call at this time. Please  hang up and try again later.  Plan: RN Health Coach will call patient within the month of January.  Emelia Loron RN, BSN [place] [phone] [email or web address]

## 2021-05-16 DIAGNOSIS — C679 Malignant neoplasm of bladder, unspecified: Secondary | ICD-10-CM | POA: Diagnosis not present

## 2021-05-16 DIAGNOSIS — R9389 Abnormal findings on diagnostic imaging of other specified body structures: Secondary | ICD-10-CM | POA: Diagnosis not present

## 2021-05-27 ENCOUNTER — Ambulatory Visit (INDEPENDENT_AMBULATORY_CARE_PROVIDER_SITE_OTHER): Payer: Medicare HMO

## 2021-05-27 DIAGNOSIS — Z0000 Encounter for general adult medical examination without abnormal findings: Secondary | ICD-10-CM | POA: Diagnosis not present

## 2021-05-27 NOTE — Progress Notes (Signed)
Subjective:   Edward Cummings is a 87 y.o. male who presents for Medicare Annual/Subsequent preventive examination. I discussed the limitations of evaluation and management by telemedicine and the availability of in person appointments. The patient expressed understanding and agreed to proceed.   Visit performed by audio   Patient location: Home  Provider location: Home   Review of Systems    N/A       Objective:    There were no vitals filed for this visit. There is no height or weight on file to calculate BMI.  Advanced Directives 05/27/2021 02/16/2021 12/15/2020 11/16/2020 10/11/2020 09/16/2020 09/10/2020  Does Patient Have a Medical Advance Directive? No No No Yes No No No  Type of Advance Directive - - Public librarian;Living will - - -  Does patient want to make changes to medical advance directive? - - No - Patient declined No - Patient declined No - Patient declined - No - Patient declined  Copy of [place] in Chart? - - No - copy requested - - - -  Would patient like information on creating a medical advance directive? - - No - Patient declined No - Patient declined - No - Patient declined No - Patient declined    Current Medications (verified) Outpatient Encounter Medications as of 05/27/2021  Medication Sig   aspirin EC 81 MG tablet Take 81 mg by mouth daily.   levothyroxine (SYNTHROID) 88 MCG tablet TAKE 1 TABLET BY MOUTH ONCE A DAY. TAKE ON AN EMPTY STOMACH WITH A GLASS OF WATER ATLEAST 30-60 MIN BEFORE BREAKFAST   loratadine (CLARITIN) 10 MG tablet Take 1 tablet (10 mg total) by mouth daily.   mometasone (NASONEX) 50 MCG/ACT nasal spray Place 2 sprays into the nose daily.   Multiple Vitamin (MULTIVITAMIN WITH MINERALS) TABS tablet Take 1 tablet by mouth daily.    prednisoLONE acetate (PRED FORTE) 1 % ophthalmic suspension SMARTSIG:In Eye(s)   predniSONE (DELTASONE) 5 MG tablet TAKE 1 TABLET BY MOUTH ONCE A DAY WITH BREAKFAST.   No  facility-administered encounter medications on file as of 05/27/2021.    Allergies (verified) Patient has no known allergies.   History: Past Medical History:  Diagnosis Date   Anemia    Anxiety 12/31/2019   Cancer [facility])    bladder   Chronic kidney disease    Depression    Hypertension    Neuromuscular disorder ([place])    Nerve damage to left face/eye since around 2002.   Past Surgical History:  Procedure Laterality Date   CYSTOSCOPY W/ RETROGRADES Bilateral 01/25/2018   Procedure: CYSTOSCOPY WITH RETROGRADE PYELOGRAM;  [name], MD;  [name];  Service: Urology;  Laterality: Bilateral;   CYSTOSCOPY W/ URETERAL STENT PLACEMENT Bilateral 01/06/2019   Procedure: CYSTOSCOPY WITH RETROGRADE PYELOGRAM/URETERAL STENT REMOVAL;  [name], MD;  [name];  Service: Urology;  Laterality: Bilateral;   CYSTOSCOPY WITH BIOPSY N/A 09/21/2020   Procedure: CYSTOSCOPY WITH BLADDER BIOPSY;  [name], MD;  [name];  Service: Urology;  [name];   CYSTOSCOPY WITH FULGERATION N/A 09/21/2020   Procedure: CYSTOSCOPY WITH FULGERATION;  [name], MD;  [name];  Service: Urology;  [name];   CYSTOSCOPY WITH STENT PLACEMENT Bilateral 01/25/2018   Procedure: CYSTOSCOPY WITH STENT PLACEMENT;  [name], MD;  [name];  Service: Urology;  Laterality: Bilateral;   DORSAL SLIT N/A 01/06/2019   Procedure:  DORSAL SLIT;  [name], MD;  [name];  Service: Urology;  [name];   ESOPHAGOGASTRODUODENOSCOPY (EGD) WITH PROPOFOL N/A 11/12/2019   Procedure: ESOPHAGOGASTRODUODENOSCOPY (EGD) WITH PROPOFOL;  Surgeon: Virgel Manifold, MD;  Location: [facility];  Service: Endoscopy;  [name];   EYE SURGERY     Cornea transplants bilaterally & cataract surgery.   TRANSURETHRAL RESECTION OF BLADDER TUMOR N/A 01/25/2018   Procedure:  TRANSURETHRAL RESECTION OF BLADDER TUMOR (TURBT);  [name], MD;  [name];  Service: Urology;  [name];   Family History  Problem Relation Age of Onset   Prostate cancer Neg Hx    Kidney cancer Neg Hx    Bladder Cancer Neg Hx    Social History   Socioeconomic History   Marital status: Widowed    Spouse name: [name]   Number of children: 2   Years of education: 6th grade   Highest education level: 6th grade  Occupational History   Not on file  Tobacco Use   Smoking status: Former    Types: Cigarettes    Quit date: 09-17-08    Years since quitting: 12.9   Smokeless tobacco: Current    Types: Chew   Tobacco comments:    Stopped approximately 10 years ago.  Vaping Use   Vaping Use: Never used  Substance and Sexual Activity   Alcohol use: Yes    Alcohol/week: 2.0 standard drinks    Types: 2 Cans of beer per week    Comment: occasionally   Drug use: Never   Sexual activity: Not Currently    Birth control/protection: None  Other Topics Concern   Not on file  Social History Narrative    lives in [place]; with oldest son. Wife died in Sep 18, 2018.  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 Pt still drives and is indepent with no serious chronic disease. Worked in [place] Strain: Low Risk    Difficulty of Paying Living Expenses: Not very hard  Food Insecurity: No Food Insecurity   Worried About Charity fundraiser in the Last Year: Never true   Arboriculturist in the Last Year: Never true  Transportation Needs: No Transportation Needs   Lack of Transportation (Medical): No   Lack of Transportation (Non-Medical): No  Physical Activity: Insufficiently Active   Days of Exercise per Week: 5 days   Minutes of Exercise per Session: 10 min  Stress: No Stress Concern Present   Feeling of Stress : Only a little  Social Connections: Unknown    Frequency of Communication with Friends and Family: More than three times a week   Frequency of Social Gatherings with Friends and Family: Three times a week   Attends Religious Services: Not on file   Active Member of Clubs or Organizations: Not on file   Attends Club or Organization Meetings: Not on file   Marital Status: Widowed    Tobacco Counseling Ready to quit: Not Answered Counseling given: Not Answered Tobacco comments: Stopped approximately 10 years ago.   Clinical Intake:  Pre-visit preparation completed: Yes  Pain : No/denies pain     Diabetes: No  How often do you need to have someone help you when you read instructions, pamphlets, or other written materials from your doctor or pharmacy?: 2 - Rarely What is the last grade level you completed in  school?: 5th grade  Diabetic?No  Interpreter Needed?: No  Information entered by :: Anson [place] CMA   Activities of Daily Living In your present state of health, do you have any difficulty performing the following activities: 05/27/2021 12/28/2020  Hearing? Y N  Vision? N N  Difficulty concentrating or making decisions? N N  Walking or climbing stairs? N N  Dressing or bathing? N N  Doing errands, shopping? N N  Preparing Food and eating ? N N  Using the Toilet? N N  In the past six months, have you accidently leaked urine? N N  Do you have problems with loss of bowel control? N N  Managing your Medications? N N  Managing your Finances? N N  Housekeeping or managing your Housekeeping? N N  Some recent data might be hidden    Patient Care Team: [name], [name], [name], MD as Consulting Physician (Gastroenterology) Clyde Canterbury, MD as Referring Physician (Otolaryngology) Ottie Glazier, MD as Consulting Physician (Pulmonary Disease) Abbie Sons, MD (Urology) Michiel Cowboy, RN as [place] any recent [place] you may have received from other than [facility] providers in the past year (date may be approximate).     Assessment:   This is a routine wellness examination for Reyan.  Hearing/Vision screen No results found.  Dietary issues and exercise activities discussed: Current Exercise Habits: Home exercise routine, Time (Minutes): 30, Frequency (Times/Week): 7, Weekly Exercise (Minutes/Week): 210, Intensity: Mild   Goals Addressed   None    Depression Screen PHQ 2/9 Scores 05/27/2021 12/28/2020 05/24/2020  PHQ - 2 Score 0 0 0    Fall Risk Fall Risk  05/27/2021 03/21/2021 12/28/2020 05/24/2020 06/09/2019  Falls in the past year? 0 0 0 0 0  Number falls in past yr: 0 0 0 0 -  Injury with Fall? 0 0 0 0 -  Risk for fall due to : No Fall Risks No Fall Risks Other (Comment) - -  Risk for fall due to: Comment - - No hx of falls, however is elderly and somewhat frail. - -  Follow up Falls evaluation completed Falls evaluation completed Falls evaluation completed - -    FALL RISK PREVENTION PERTAINING TO THE HOME:  Any stairs in or around the home? No  If so, are there any without handrails? No  Home free of loose throw rugs in walkways, pet beds, electrical cords, etc? Yes  Adequate lighting in your home to reduce risk of falls? Yes   ASSISTIVE DEVICES UTILIZED TO PREVENT FALLS:  Life alert? No  Use of a cane, walker or w/c? No  Grab bars in the bathroom? Yes  Shower chair or bench in shower? Yes  Elevated toilet seat or a handicapped toilet? No   TIMED UP AND GO:  Was the test performed? No .  Length of time to ambulate 10 feet: 0 sec.     Cognitive Function:     6CIT Screen 05/27/2021  What Year? 0 points  What month? 0 points  What time? 0 points  Count back from 20 0 points  Months in reverse 0 points  Repeat phrase 2 points  Total Score 2    Immunizations Immunization History   Administered Date(s) Administered   Influenza-Unspecified 04/12/2021   PFIZER(Purple Top)SARS-COV-2 Vaccination 08/08/2019, 08/29/2019, 03/09/2020   Pneumococcal Conjugate-13 02/19/2015   Pneumococcal  Polysaccharide-23 04/08/2018, 02/20/2020   Zoster Recombinat (Shingrix) 02/26/2017, 05/15/2017    TDAP status: Up to date  Flu Vaccine status: Up to date  Pneumococcal vaccine status: Up to date  Covid-19 vaccine status: Information provided on how to obtain vaccines.   Qualifies for Shingles Vaccine? Yes   Zostavax completed Yes   Shingrix Completed?: Yes  Screening Tests Health Maintenance  Topic Date Due   COVID-19 Vaccine (4 - Booster for Pfizer series) 05/04/2020   TETANUS/TDAP  12/21/2021 (Originally 09/13/1951)   Pneumonia Vaccine 3+ Years old  Completed   INFLUENZA VACCINE  Completed   Zoster Vaccines- Shingrix  Completed   HPV VACCINES  Aged Out    Health Maintenance  Health Maintenance Due  Topic Date Due   COVID-19 Vaccine (4 - Booster for Spencer series) 05/04/2020    Colorectal cancer screening: No longer required.   Lung Cancer Screening: (Low Dose CT Chest recommended if Age 66-80 years, 30 pack-year currently smoking OR have quit w/in 15years.) does not qualify.   Lung Cancer Screening Referral: No  Additional Screening:  Hepatitis C Screening: does not qualify; Completed No  Vision Screening: Recommended annual ophthalmology exams for early detection of glaucoma and other disorders of the eye. Is the patient up to date with their annual eye exam?  No  Who is the provider or what is the name of the office in which the patient attends annual eye exams? [facility] If pt is not established with a provider, would they like to be referred to a provider to establish care? No .   Dental Screening: Recommended annual dental exams for proper oral hygiene  Community Resource Referral / Chronic Care Management: CRR required this visit?  No   CCM  required this visit?  No      Plan:     I have personally reviewed and noted the following in the patients chart:   Medical and social history Use of alcohol, tobacco or illicit drugs  Current medications and supplements including opioid prescriptions. Patient is not currently taking opioid prescriptions. Functional ability and status Nutritional status Physical activity Advanced directives List of other physicians Hospitalizations, surgeries, and ER visits in previous 12 months Vitals Screenings to include cognitive, depression, and falls Referrals and appointments  In addition, I have reviewed and discussed with patient certain preventive protocols, quality metrics, and best practice recommendations. A written personalized care plan for preventive services as well as general preventive health recommendations were provided to patient.    Mr. Broce , Thank you for taking time to come for your Medicare Wellness Visit. I appreciate your ongoing commitment to your health goals. Please review the following plan we discussed and let me know if I can assist you in the future.   These are the goals we discussed:  Goals       Learn More About My Health [facility]) (pt-stated)      Timeframe:  Long-Range Goal Priority:  Medium Start Date:                        02/15/21     Expected End Date:          06/11/21              Follow Up Date pending DM outreach   Barriers: Knowledge  - make a list of questions - ask questions    Why is this important?   The best way to learn about  your health and care is by talking to the doctor and nurse.  They will answer your questions and give you information in the way that you like best.    Notes:  03/08/21 Progression of care to [facility] disease mangagement agreed upon for further education on CKD, HTN 02/15/21 asked questions about post nasal drainage, nasonex      Track and Manage My Blood Pressure-Hypertension (THN) (pt-stated)      Timeframe:   Long-Range Goal Priority:  High Start Date:                   01/12/21          Expected End Date:       06/10/21                Follow Up Date pending DM outreach Barriers: Knowledge    - check blood pressure weekly    Why is this important?   You won't feel high blood pressure, but it can still hurt your blood vessels.  High blood pressure can cause heart or kidney problems. It can also cause a stroke.  Making lifestyle changes like losing a little weight or eating less salt will help.  Checking your blood pressure at home and at different times of the day can help to control blood pressure.  If the doctor prescribes medicine remember to take it the way the doctor ordered.  Call the office if you cannot afford the medicine or if there are questions about it.     Notes:  03/08/21 Doing well no reported worsening symptoms 02/15/21 No worsening symptoms BP managed well at home  01/12/21 Assessed for any worsening symptoms He denies headaches and chest pain but reports some shortness of breath (sob) when he is in heat         This is a list of the screening recommended for you and due dates:  Health Maintenance  Topic Date Due   COVID-19 Vaccine (4 - Booster for Pfizer series) 05/04/2020   Tetanus Vaccine  12/21/2021*   Pneumonia Vaccine  Completed   Flu Shot  Completed   Zoster (Shingles) Vaccine  Completed   HPV Vaccine  Aged Out  *Topic was postponed. The date shown is not the original due date.     Renato Gails, [place]   05/27/2021   Nurse Notes: Patient does not need anything at this time. Patient's care gaps are all completed and nothing due at this time. Patient states he does not need any referrals or medication refills.

## 2021-05-27 NOTE — Progress Notes (Signed)
I have reviewed this visit and agree with the documentation.   

## 2021-05-30 MED ORDER — AZITHROMYCIN 250 MG PO TABS: ORAL_TABLET | ORAL | 0 refills | Status: AC

## 2021-05-30 NOTE — Addendum Note (Signed)
Addended by: Lacretia Nicks L on: 05/30/2021 11:53 AM   Modules accepted: Orders

## 2021-05-31 ENCOUNTER — Other Ambulatory Visit: Payer: Self-pay

## 2021-06-23 ENCOUNTER — Encounter: Payer: Self-pay | Admitting: *Deleted

## 2021-06-23 ENCOUNTER — Other Ambulatory Visit: Payer: Self-pay | Admitting: *Deleted

## 2021-06-23 NOTE — Patient Instructions (Addendum)
Visit Information  Thank you for taking time to visit with me today. Please don't hesitate to contact me if I can be of assistance to you before our next scheduled telephone appointment.  Following are the goals we discussed today:  Patient Goals/Self-Care Activities: Take all medications as prescribed Attend all scheduled provider appointments check blood pressure 3 times per week write blood pressure results in a log or diary learn about high blood pressure take blood pressure log to all doctor appointments Continue to stay active by not sitting for long periods and helping your son in his shop Limit the amount of sodium in your diet Drink 1-2 Ensure supplements a day Drink 2-3 bottles of water a day Eat high calorie and high protein foods frequently throughout the day Weigh 3 times a week to help maintain weight at 135 pounds or greater Make an eye examination appointment for your eye health Use Neti Pot to clear nasal passages; they are available at the drug store   The patient verbalized understanding of instructions, educational materials, and care plan provided today and agreed to receive a mailed copy of patient instructions, educational materials, and care plan.   Telephone follow up appointment with care management team member scheduled [id number]    Emelia Loron RN, [place] [phone] [email or web address]

## 2021-06-23 NOTE — Patient Outreach (Signed)
[facility]) Care Management  06/23/2021  [name] [birth year] [id number]  [facility]) Care Management RN Health Coach Note   06/23/2021 Name:  Edward Cummings MRN:  [id number] DOB:  May 31, 1933  Summary: Patient states that he is feeling good. He reports not taking his B/P at home. Nurse provided education and the patient stated he would begin to monitor his B/P 3 times a week. Patient explains that he has gained weight to 135 pounds. Nurse discussed strategies to help with weight gain and will send patient hypertension and high calorie/high protein education as well as Ensure Coupons. Patient states his home environment is safe, denies any recent falls, and explains he is well supported by his sons.  Recommendations/Changes made from today's visit: check blood pressure 3 times per week Drink 1-2 Ensure supplements a day Drink 2-3 bottles of water a day Eat high calorie and high protein foods frequently throughout the day Weigh 3 times a week to help maintain weight at 135 pounds or greater Make an eye examination appointment for your eye health  Subjective: Edward Cummings is an 88 y.o. year old male who is a primary patient of [name], MD. The care management team was consulted for assistance with care management and/or care coordination needs.    RN Health Coach completed Telephone Visit today.   Objective:  Medications Reviewed Today     Reviewed by Michiel Cowboy, RN (Registered Nurse) on 06/23/21 at 24  Med List Status: <None>   Medication Order Taking? Sig Documenting Provider Last Dose Status Informant  aspirin EC 81 MG tablet [id number] Yes Take 81 mg by mouth daily. [provider] Taking Active Family Member           Med Note Sabino Gasser   Fri Jan 10, 2019  9:07 AM)    levothyroxine (SYNTHROID) 88 MCG tablet [id number] Yes TAKE 1 TABLET BY MOUTH ONCE A DAY. TAKE ON AN EMPTY STOMACH WITH A GLASS OF WATER ATLEAST  30-60 MIN BEFORE BREAKFAST Cammie Sickle, MD Taking Active   loratadine (CLARITIN) 10 MG tablet [id number] Yes Take 1 tablet (10 mg total) by mouth daily. [name], MD Taking Active   mometasone (NASONEX) 50 MCG/ACT nasal spray [id number] Yes Place 2 sprays into the nose daily. [name], MD Taking Active   Multiple Vitamin (MULTIVITAMIN WITH MINERALS) TABS tablet [id number] Yes Take 1 tablet by mouth daily.  [provider] Taking Active Family Member  prednisoLONE acetate (PRED FORTE) 1 % ophthalmic suspension [id number] Yes SMARTSIG:In Eye(s) [provider] Taking Active   predniSONE (DELTASONE) 5 MG tablet [id number] Yes TAKE 1 TABLET BY MOUTH ONCE A DAY WITH BREAKFAST. Cammie Sickle, MD Taking Active              SDOH:  (Social Determinants of Health) assessments and interventions performed: SDOH assessments completed today and documented in the Epic system.     Care Plan  Review of patient past medical history, allergies, medications, health status, including review of consultants reports, laboratory and other test data, was performed as part of comprehensive evaluation for care management services.   Care Plan : Hypertension (Adult)  Updates made by Michiel Cowboy, RN since 06/23/2021 12:00 AM     Problem: Disease Progression (Hypertension) Resolved 06/23/2021  Priority: High  Onset Date: 01/12/2021     Long-Range Goal: Disease Progression Prevented or Minimized Completed 06/23/2021  Start Date: 01/12/2021  Expected End Date: 06/10/2021  Recent Progress: On track  Priority: High  Note:   Resolving due to duplicate goal  Evidence-based guidance:  Tailor lifestyle advice to individual; review progress regularly; give frequent encouragement and respond positively to incremental successes.  Assess for and promote awareness of worsening disease or development of comorbidity.  Prepare patient for laboratory and diagnostic exams based on risk  and presentation.  Prepare patient for use of pharmacologic therapy that may include diuretic, beta-blocker, beta-blocker/thiazide combination, angiotensin-converting enzyme inhibitor, renin-angiotensin blocker or calcium-channel blocker.  Expect periodic adjustments to pharmacologic therapy; manage side effects.  Promote a healthy diet that includes primarily plant-based foods, such as fruits, vegetables, whole grains, beans and legumes, low-fat dairy and lean meats.   Consider moderate reduction in sodium intake by avoiding the addition of salt to prepared foods and limiting processed meats, canned soup, frozen meals and salty snacks.   Promote a regular, daily exercise goal of 150 minutes per week of moderate exercise based on tolerance, ability and patient choice; consider referral to physical therapist, community wellness and/or activity program.  Encourage the avoidance of no more than 2 hours per day of sedentary activity, such as recreational screen time.  Review sources of stress; explore current coping strategies and encourage use of mindfulness, yoga, meditation or exercise to manage stress.   Notes:     Task: Alleviate Barriers to Hypertension Treatment Completed 06/23/2021  Due Date: 06/10/2021  Outcome: Positive  Responsible User: Barbaraann Faster, RN  Note:   Care Management Activities:   03/08/21 improvements referred to [facility] DM   01/12/21- healthy diet promoted - healthy family lifestyle promoted - medical nutrition therapy provided - medication side effects managed - pain assessed and managed - patient response to treatment assessed - quality of sleep assessed - reduction of dietary sodium encouraged - reduction in sedentary activities encouraged - response to pharmacologic therapy monitored - sleep hygiene techniques encouraged    Notes:     Care Plan : Wellness (Adult)  Updates made by Michiel Cowboy, RN since 06/23/2021 12:00 AM     Problem: Health Promotion or  Disease Self-Management (General Plan of Care) Resolved 06/23/2021  Priority: Medium  Onset Date: 02/15/2021     Long-Range Goal: Self-Management Plan Developed Completed 06/23/2021  Start Date: 02/15/2021  Expected End Date: 06/10/2021  Recent Progress: On track  Priority: Medium  Note:   Evidence-based guidance:  Review biopsychosocial determinants of health screens.  Determine level of modifiable health risk.  Assess level of patient activation, level of readiness, importance and confidence to make changes.  Evoke change talk using open-ended questions, pros and cons, as well as looking forward.  Identify areas where behavior change may lead to improved health.  Partner with patient to develop a robust self-management plan that includes lifestyle factors, such as weight loss, exercise and healthy nutrition, as well as goals specific to disease risks.  Support patient and family/caregiver active participation in decision-making and self-management plan.  Implement additional goals and interventions based on identified risk factors to reduce health risk.  Facilitate advance care planning.  Review need for preventive screening based on age, sex, family history and health history.   Notes:     Task: Mutually Develop and Foster Achievement of Patient Goals Completed 06/23/2021  Due Date: 06/10/2021  Outcome: Positive  Responsible User: Barbaraann Faster, RN  Note:   Care Management Activities:   03/08/21 improvements referred to [facility] DM  02/15/21- barriers to meeting goals identified - change-talk evoked -  choices provided - collaboration with team encouraged - decision-making supported - health risks reviewed - problem-solving facilitated - questions answered - readiness for change evaluated - reassurance provided - resources needed to meet goals identified - self-reflection promoted - self-reliance encouraged - verbalization of feelings encouraged    Notes:     Care Plan : [place] of Care  Updates made by Michiel Cowboy, RN since 06/23/2021 12:00 AM     Problem: Knowledge Deficit Related to Hypertension and  Increasing BMI   Priority: High     Long-Range Goal: Development of Plan of Care for Management of Hypertension and Increasing BMI   Start Date: 06/23/2021  Expected End Date: 07/11/2021  Priority: High  Note:   Current Barriers:  Knowledge Deficits related to plan of care for management of HTN  and increasing BMI  RNCM Clinical Goal(s):  Patient will demonstrate Improved adherence to prescribed treatment plan for HTN and maintaining weight at 135 or higher as evidenced by beginning to monitor B/P 3 times a week; beginning to drink 1-2 Ensures a day; weighing 3 times a week to ensure weight is at goal; taking medications as prescribed; contacting providers for any questions and concerns continue to work with Consulting civil engineer to address care management and care coordination needs related to  HTN and maintaining weight at 135 or higher as evidenced by adherence to CM Team Scheduled appointments through collaboration with RN Care manager, provider, and care team.   Interventions: Inter-disciplinary care team collaboration (see longitudinal plan of care) Evaluation of current treatment plan related to  self management and patient's adherence to plan as established by provider   Hypertension Interventions:  (Status:  New goal.) Long Term Goal Last practice recorded BP readings:  BP Readings from Last 3 Encounters:  04/25/21 (!) 157/80  04/20/21 (!) 144/65  03/21/21 127/69  Most recent eGFR/CrCl: No results found for: EGFR  No components found for: CRCL  Evaluation of current treatment plan related to hypertension self management and patient's adherence to plan as established by provider Discussed complications of poorly controlled blood pressure such as heart disease, stroke, circulatory complications, vision complications, kidney impairment,  sexual dysfunction Discussed monitoring B/P 3 times a week, recording the values, and bringing the B/P recording to provider appointments Encouraged patient to continue to stay physically active by not sitting for long periods and 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 Discussed limiting salt in his diet  Weight Gain RNCM Interventions: - Discussed drinking 1-2 Ensures a day; Nurse will send Ensure coupons - Encouraged patient to eat high calorie and high protein foods frequently throughout the day; nurse will send education - Discussed Drinking 2-3 bottles of water a day to stay hydrated - Discussed weighing 3 times a week to ensure his weight is being maintained at 135 or greater - Set a goal of 135 pounds or greater  Patient Goals/Self-Care Activities: Take all medications as prescribed Attend all scheduled provider appointments check blood pressure 3 times per week write blood pressure results in a log or diary learn about high blood pressure take blood pressure log to all doctor appointments keep all doctor appointments Continue to stay active by not sitting for long periods and helping your son in his shop Limit the amount of sodium in your diet Drink 1-2 Ensure supplements a day Drink 2-3 bottles of water a day Eat high calorie and high protein foods frequently throughout the day Weigh 3 times a week to  help maintain weight at 135 pounds or greater Make an eye examination appointment for your eye health Use Neti Pot to clear nasal passages; they are available at the drug store  Follow Up Plan:  Telephone follow up appointment with care management team member scheduled for:  April       Plan: Telephone follow up appointment with care management team member scheduled for:  April. Nurse will send PCP a barrier letter and today's assessment note.  Emelia Loron RN, [place] [phone] [email or web address]

## 2021-07-01 DIAGNOSIS — R918 Other nonspecific abnormal finding of lung field: Secondary | ICD-10-CM | POA: Diagnosis not present

## 2021-07-12 ENCOUNTER — Ambulatory Visit (INDEPENDENT_AMBULATORY_CARE_PROVIDER_SITE_OTHER): Payer: Medicare HMO | Admitting: Internal Medicine

## 2021-07-12 ENCOUNTER — Other Ambulatory Visit: Payer: Self-pay | Admitting: *Deleted

## 2021-07-12 ENCOUNTER — Encounter: Payer: Self-pay | Admitting: Internal Medicine

## 2021-07-12 ENCOUNTER — Other Ambulatory Visit: Payer: Self-pay

## 2021-07-12 VITALS — BP 138/66 | HR 83 | Ht 68.0 in | Wt 134.5 lb

## 2021-07-12 DIAGNOSIS — N1831 Chronic kidney disease, stage 3a: Secondary | ICD-10-CM | POA: Diagnosis not present

## 2021-07-12 DIAGNOSIS — C678 Malignant neoplasm of overlapping sites of bladder: Secondary | ICD-10-CM

## 2021-07-12 DIAGNOSIS — J301 Allergic rhinitis due to pollen: Secondary | ICD-10-CM | POA: Diagnosis not present

## 2021-07-12 DIAGNOSIS — I10 Essential (primary) hypertension: Secondary | ICD-10-CM | POA: Diagnosis not present

## 2021-07-12 DIAGNOSIS — E032 Hypothyroidism due to medicaments and other exogenous substances: Secondary | ICD-10-CM

## 2021-07-12 DIAGNOSIS — R69 Illness, unspecified: Secondary | ICD-10-CM | POA: Diagnosis not present

## 2021-07-12 DIAGNOSIS — Z716 Tobacco abuse counseling: Secondary | ICD-10-CM

## 2021-07-12 DIAGNOSIS — F419 Anxiety disorder, unspecified: Secondary | ICD-10-CM

## 2021-07-12 MED ORDER — AMOXICILLIN-POT CLAVULANATE 875-125 MG PO TABS: 1.0000 | ORAL_TABLET | Freq: Two times a day (BID) | ORAL | 0 refills | Status: DC

## 2021-07-12 NOTE — Assessment & Plan Note (Signed)
Patient is going to get an MRI next week

## 2021-07-12 NOTE — Assessment & Plan Note (Signed)
-   I instructed the patient to stop smoking and provided them with smoking cessation materials.  - I informed the patient that smoking puts them at increased risk for cancer, COPD, hypertension, and more.  - Informed the patient to seek help if they begin to have trouble breathing, develop chest pain, start to cough up blood, feel faint, or pass out.  

## 2021-07-12 NOTE — Assessment & Plan Note (Signed)
-   Patient experiencing high levels of anxiety.  - Encouraged patient to engage in relaxing activities like yoga, meditation, journaling, going for a walk, or participating in a hobby.  - Encouraged patient to reach out to trusted friends or family members about recent struggles, Patient was advised to read A book, how to stop worrying and start living, it is good book to read to control  the stress  

## 2021-07-12 NOTE — Assessment & Plan Note (Signed)
Allergy under control

## 2021-07-12 NOTE — Progress Notes (Signed)
Established Patient Office Visit  Subjective:  Patient ID: Edward Cummings, male    DOB: [birth year]  Age: 88 y.o. MRN: [id number]  CC:  Chief Complaint  Patient presents with   Sinusitis    Patient is here for sore throat and congestion, pt states this has been a chronic condition.  Patient complains of sinus problem, he has a history of transitional cell cancer of the bladder, CT scan has been done which is abnormal he is also going to have an MRI done.  He has gained 30 pounds of weight.  He does not smoke anymore.  Does not drink any alcohol.  He has a history of cataract surgery in the past    Edward Cummings presents for general check up  Past Medical History:  Diagnosis Date   Anemia    Anxiety 12/31/2019   Cancer [facility])    bladder   Chronic kidney disease    Depression    Hypertension    Neuromuscular disorder ([place])    Nerve damage to left face/eye since around 2002.    Past Surgical History:  Procedure Laterality Date   CYSTOSCOPY W/ RETROGRADES Bilateral 01/25/2018   Procedure: CYSTOSCOPY WITH RETROGRADE PYELOGRAM;  [name], MD;  [name];  Service: Urology;  Laterality: Bilateral;   CYSTOSCOPY W/ URETERAL STENT PLACEMENT Bilateral 01/06/2019   Procedure: CYSTOSCOPY WITH RETROGRADE PYELOGRAM/URETERAL STENT REMOVAL;  [name], MD;  [name];  Service: Urology;  Laterality: Bilateral;   CYSTOSCOPY WITH BIOPSY N/A 09/21/2020   Procedure: CYSTOSCOPY WITH BLADDER BIOPSY;  [name], MD;  [name];  Service: Urology;  [name];   CYSTOSCOPY WITH FULGERATION N/A 09/21/2020   Procedure: CYSTOSCOPY WITH FULGERATION;  [name], MD;  [name];  Service: Urology;  [name];   CYSTOSCOPY WITH STENT PLACEMENT Bilateral 01/25/2018   Procedure: CYSTOSCOPY WITH STENT PLACEMENT;  [name], MD;  [name];  Service: Urology;  Laterality:  Bilateral;   DORSAL SLIT N/A 01/06/2019   Procedure: DORSAL SLIT;  [name], MD;  [name];  Service: Urology;  [name];   ESOPHAGOGASTRODUODENOSCOPY (EGD) WITH PROPOFOL N/A 11/12/2019   Procedure: ESOPHAGOGASTRODUODENOSCOPY (EGD) WITH PROPOFOL;  Surgeon: Virgel Manifold, MD;  Location: [facility] ENDOSCOPY;  Service: Endoscopy;  [name];   EYE SURGERY     Cornea transplants bilaterally & cataract surgery.   TRANSURETHRAL RESECTION OF BLADDER TUMOR N/A 01/25/2018   Procedure: TRANSURETHRAL RESECTION OF BLADDER TUMOR (TURBT);  [name], MD;  [name];  Service: Urology;  [name];    Family History  Problem Relation Age of Onset   Prostate cancer Neg Hx    Kidney cancer Neg Hx    Bladder Cancer Neg Hx     Social History   Socioeconomic History   Marital status: Widowed    Spouse name: [name]   Number of children: 2   Years of education: 6th grade   Highest education level: 6th grade  Occupational History   Not on file  Tobacco Use   Smoking status: Former    Types: Cigarettes    Quit date: 2010    Years since quitting: 13.0   Smokeless tobacco: Current    Types: Chew   Tobacco comments:    Stopped approximately 10 years ago.  Vaping Use   Vaping Use: Never used  Substance and Sexual Activity  Alcohol use: Yes    Alcohol/week: 2.0 standard drinks    Types: 2 Cans of beer per week    Comment: occasionally   Drug use: Never   Sexual activity: Not Currently    Birth control/protection: None  Other Topics Concern   Not on file  Social History Narrative    [place] with oldest son. Wife died in 09-04-18.  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 Pt still drives and is indepent with no serious chronic disease. Worked in [place] Strain: Low Risk    Difficulty of Paying Living  Expenses: Not very hard  Food Insecurity: No Food Insecurity   Worried About Charity fundraiser in the Last Year: Never true   Arboriculturist in the Last Year: Never true  Transportation Needs: No Transportation Needs   Lack of Transportation (Medical): No   Lack of Transportation (Non-Medical): No  Physical Activity: Insufficiently Active   Days of Exercise per Week: 5 days   Minutes of Exercise per Session: 10 min  Stress: No Stress Concern Present   Feeling of Stress : Only a little  Social Connections: Unknown   Frequency of Communication with Friends and Family: More than three times a week   Frequency of Social Gatherings with Friends and Family: Three times a week   Attends Religious Services: Not on file   Active Member of Clubs or Organizations: Not on file   Attends Club or Organization Meetings: Not on file   Marital Status: Widowed  Human resources officer Violence: Not At Risk   Fear of Current or Ex-Partner: No   Emotionally Abused: No   Physically Abused: No   Sexually Abused: No     Current Outpatient Medications:    aspirin EC 81 MG tablet, Take 81 mg by mouth daily., Disp: , Rfl:    levothyroxine (SYNTHROID) 88 MCG tablet, TAKE 1 TABLET BY MOUTH ONCE A DAY. TAKE ON AN EMPTY STOMACH WITH A GLASS OF WATER ATLEAST 30-60 MIN BEFORE BREAKFAST, Disp: 90 tablet, Rfl: 3   loratadine (CLARITIN) 10 MG tablet, Take 1 tablet (10 mg total) by mouth daily., Disp: 30 tablet, Rfl: 11   mometasone (NASONEX) 50 MCG/ACT nasal spray, Place 2 sprays into the nose daily., Disp: 1 each, Rfl: 12   Multiple Vitamin (MULTIVITAMIN WITH MINERALS) TABS tablet, Take 1 tablet by mouth daily. , Disp: , Rfl:    prednisoLONE acetate (PRED FORTE) 1 % ophthalmic suspension, SMARTSIG:In Eye(s), Disp: , Rfl:    predniSONE (DELTASONE) 5 MG tablet, TAKE 1 TABLET BY MOUTH ONCE A DAY WITH BREAKFAST., Disp: 60 tablet, Rfl: 0   No Known Allergies  ROS Review of Systems  Constitutional: Negative.   HENT:  Negative.    Eyes: Negative.   Respiratory: Negative.    Cardiovascular: Negative.   Gastrointestinal: Negative.   Endocrine: Negative.   Genitourinary: Negative.   Musculoskeletal: Negative.   Skin: Negative.   Allergic/Immunologic: Negative.   Neurological: Negative.   Hematological: Negative.   Psychiatric/Behavioral: Negative.    All other systems reviewed and are negative.    Objective:    Physical Exam Vitals reviewed.  Constitutional:      Appearance: Normal appearance.  HENT:     Mouth/Throat:     Mouth: Mucous membranes are moist.  Eyes:     Pupils: Pupils are equal, round, and reactive to  light.  Neck:     Vascular: No carotid bruit.  Cardiovascular:     Rate and Rhythm: Normal rate and regular rhythm.     Pulses: Normal pulses.     Heart sounds: Normal heart sounds.  Pulmonary:     Effort: Pulmonary effort is normal.     Breath sounds: Normal breath sounds.  Abdominal:     General: Bowel sounds are normal.     Palpations: Abdomen is soft. There is no hepatomegaly, splenomegaly or mass.     Tenderness: There is no abdominal tenderness.     Hernia: No hernia is present.  Musculoskeletal:     Cervical back: Neck supple.     Right lower leg: No edema.     Left lower leg: No edema.  Skin:    Findings: No rash.  Neurological:     Mental Status: He is alert and oriented to person, place, and time.     Motor: No weakness.  Psychiatric:        Mood and Affect: Mood normal.        Behavior: Behavior normal.    BP 138/66    Pulse 83    Ht 5\' 8"  (1.727 m)    Wt 134 lb 8 oz (61 kg)    BMI 20.45 kg/m  Wt Readings from Last 3 Encounters:  07/12/21 134 lb 8 oz (61 kg)  04/25/21 131 lb (59.4 kg)  04/20/21 131 lb (59.4 kg)     Health Maintenance Due  Topic Date Due   COVID-19 Vaccine (4 - Booster for Pfizer series) 05/04/2020    There are no preventive care reminders to display for this patient.  Lab Results  Component Value Date   TSH 3.116  02/16/2021   Lab Results  Component Value Date   WBC 10.8 (H) 04/20/2021   HGB 12.1 (L) 04/20/2021   HCT 37.0 (L) 04/20/2021   MCV 99.7 04/20/2021   PLT 225 04/20/2021   Lab Results  Component Value Date   NA 137 04/20/2021   K 4.2 04/20/2021   CO2 27 04/20/2021   GLUCOSE 85 04/20/2021   BUN 35 (H) 04/20/2021   CREATININE 2.18 (H) 04/20/2021   BILITOT 0.6 04/20/2021   ALKPHOS 62 04/20/2021   AST 24 04/20/2021   ALT 24 04/20/2021   PROT 6.6 04/20/2021   ALBUMIN 4.0 04/20/2021   CALCIUM 9.2 04/20/2021   ANIONGAP 6 04/20/2021   No results found for: CHOL No results found for: HDL No results found for: LDLCALC No results found for: TRIG No results found for: CHOLHDL Lab Results  Component Value Date   HGBA1C 4.5 (L) 04/13/2018      Assessment & Plan:   Problem List Items Addressed This Visit       Cardiovascular and Mediastinum   Hypertension - Primary     Patient denies any chest pain or shortness of breath there is no history of palpitation or paroxysmal nocturnal dyspnea   patient was advised to follow low-salt low-cholesterol diet    ideally I want to keep systolic blood pressure below 130 mmHg, patient was asked to check blood pressure one times a week and give me a report on that.  Patient will be follow-up in 3 months  or earlier as needed, patient will call me back for any change in the cardiovascular symptoms Patient was advised to buy a book from local bookstore concerning blood pressure and read several chapters  every day.  This will be  supplemented by some of the material we will give him from the office.  Patient should also utilize other resources like YouTube and Internet to learn more about the blood pressure and the diet.        Respiratory   Seasonal allergic rhinitis due to pollen    Allergy under control        Genitourinary   Cancer of overlapping sites of bladder [facility])    Patient is going to get an MRI next week      Stage 3a chronic  kidney disease ([place])    CKD stable        Other   Anxiety    - Patient experiencing high levels of anxiety.  - Encouraged patient to engage in relaxing activities like yoga, meditation, journaling, going for a walk, or participating in a hobby.  - Encouraged patient to reach out to trusted friends or family members about recent struggles, Patient was advised to read A book, how to stop worrying and start living, it is good book to read to control  the stress       Tobacco abuse counseling    - I instructed the patient to stop smoking and provided them with smoking cessation materials.  - I informed the patient that smoking puts them at increased risk for cancer, COPD, hypertension, and more.  - Informed the patient to seek help if they begin to have trouble breathing, develop chest pain, start to cough up blood, feel faint, or pass out.       No orders of the defined types were placed in this encounter.   Follow-up: No follow-ups on file.    [name], MD

## 2021-07-12 NOTE — Assessment & Plan Note (Signed)
CKD stable

## 2021-07-12 NOTE — Assessment & Plan Note (Signed)

## 2021-07-15 ENCOUNTER — Ambulatory Visit: Admission: RE | Admit: 2021-07-15 | Payer: Medicare HMO | Source: Ambulatory Visit

## 2021-07-19 ENCOUNTER — Other Ambulatory Visit: Payer: Self-pay | Admitting: Internal Medicine

## 2021-07-20 ENCOUNTER — Encounter: Payer: Self-pay | Admitting: Internal Medicine

## 2021-07-20 ENCOUNTER — Other Ambulatory Visit: Payer: Self-pay

## 2021-07-20 ENCOUNTER — Inpatient Hospital Stay: Payer: Medicare HMO | Admitting: Internal Medicine

## 2021-07-20 ENCOUNTER — Inpatient Hospital Stay: Payer: Medicare HMO | Attending: Internal Medicine

## 2021-07-20 DIAGNOSIS — Z87891 Personal history of nicotine dependence: Secondary | ICD-10-CM | POA: Diagnosis not present

## 2021-07-20 DIAGNOSIS — N183 Chronic kidney disease, stage 3 unspecified: Secondary | ICD-10-CM | POA: Insufficient documentation

## 2021-07-20 DIAGNOSIS — Z7952 Long term (current) use of systemic steroids: Secondary | ICD-10-CM | POA: Diagnosis not present

## 2021-07-20 DIAGNOSIS — Z7951 Long term (current) use of inhaled steroids: Secondary | ICD-10-CM | POA: Diagnosis not present

## 2021-07-20 DIAGNOSIS — C772 Secondary and unspecified malignant neoplasm of intra-abdominal lymph nodes: Secondary | ICD-10-CM | POA: Insufficient documentation

## 2021-07-20 DIAGNOSIS — Z79899 Other long term (current) drug therapy: Secondary | ICD-10-CM | POA: Diagnosis not present

## 2021-07-20 DIAGNOSIS — Z7982 Long term (current) use of aspirin: Secondary | ICD-10-CM | POA: Diagnosis not present

## 2021-07-20 DIAGNOSIS — E039 Hypothyroidism, unspecified: Secondary | ICD-10-CM | POA: Diagnosis not present

## 2021-07-20 DIAGNOSIS — C678 Malignant neoplasm of overlapping sites of bladder: Secondary | ICD-10-CM | POA: Diagnosis not present

## 2021-07-20 DIAGNOSIS — N400 Enlarged prostate without lower urinary tract symptoms: Secondary | ICD-10-CM | POA: Diagnosis not present

## 2021-07-20 DIAGNOSIS — D631 Anemia in chronic kidney disease: Secondary | ICD-10-CM | POA: Insufficient documentation

## 2021-07-20 DIAGNOSIS — E032 Hypothyroidism due to medicaments and other exogenous substances: Secondary | ICD-10-CM

## 2021-07-20 LAB — CBC WITH DIFFERENTIAL/PLATELET
Abs Immature Granulocytes: 0.04 10*3/uL (ref 0.00–0.07)
Basophils Absolute: 0 10*3/uL (ref 0.0–0.1)
Basophils Relative: 0 %
Eosinophils Absolute: 0.4 10*3/uL (ref 0.0–0.5)
Eosinophils Relative: 4 %
HCT: 37.3 % — ABNORMAL LOW (ref 39.0–52.0)
Hemoglobin: 12.4 g/dL — ABNORMAL LOW (ref 13.0–17.0)
Immature Granulocytes: 0 %
Lymphocytes Relative: 33 %
Lymphs Abs: 3.4 10*3/uL (ref 0.7–4.0)
MCH: 32.9 pg (ref 26.0–34.0)
MCHC: 33.2 g/dL (ref 30.0–36.0)
MCV: 98.9 fL (ref 80.0–100.0)
Monocytes Absolute: 0.9 10*3/uL (ref 0.1–1.0)
Monocytes Relative: 8 %
Neutro Abs: 5.6 10*3/uL (ref 1.7–7.7)
Neutrophils Relative %: 55 %
Platelets: 229 10*3/uL (ref 150–400)
RBC: 3.77 MIL/uL — ABNORMAL LOW (ref 4.22–5.81)
RDW: 12.8 % (ref 11.5–15.5)
WBC: 10.5 10*3/uL (ref 4.0–10.5)
nRBC: 0 % (ref 0.0–0.2)

## 2021-07-20 LAB — COMPREHENSIVE METABOLIC PANEL
ALT: 31 U/L (ref 0–44)
AST: 31 U/L (ref 15–41)
Albumin: 3.7 g/dL (ref 3.5–5.0)
Alkaline Phosphatase: 73 U/L (ref 38–126)
Anion gap: 7 (ref 5–15)
BUN: 32 mg/dL — ABNORMAL HIGH (ref 8–23)
CO2: 25 mmol/L (ref 22–32)
Calcium: 9.1 mg/dL (ref 8.9–10.3)
Chloride: 106 mmol/L (ref 98–111)
Creatinine, Ser: 2.54 mg/dL — ABNORMAL HIGH (ref 0.61–1.24)
GFR, Estimated: 24 mL/min — ABNORMAL LOW (ref >60)
Glucose, Bld: 92 mg/dL (ref 70–99)
Potassium: 4.4 mmol/L (ref 3.5–5.1)
Sodium: 138 mmol/L (ref 135–145)
Total Bilirubin: 0.6 mg/dL (ref 0.3–1.2)
Total Protein: 7.1 g/dL (ref 6.5–8.1)

## 2021-07-20 LAB — IRON AND TIBC
Iron: 60 ug/dL (ref 45–182)
Saturation Ratios: 23 % (ref 17.9–39.5)
TIBC: 266 ug/dL (ref 250–450)
UIBC: 206 ug/dL

## 2021-07-20 LAB — TSH: TSH: 10.008 u[IU]/mL — ABNORMAL HIGH (ref 0.350–4.500)

## 2021-07-20 LAB — FERRITIN: Ferritin: 978 ng/mL — ABNORMAL HIGH (ref 24–336)

## 2021-07-20 NOTE — Progress Notes (Signed)
[facility] NOTE  Patient Care Team: [name], [name], [name], MD (Inactive) as Consulting Physician (Gastroenterology) Clyde Canterbury, MD as Referring Physician (Otolaryngology) Ottie Glazier, MD as Consulting Physician (Pulmonary Disease) Bernardo Heater Ronda Fairly, MD (Urology) Michiel Cowboy, RN as [facility]  CHIEF COMPLAINTS/PURPOSE OF CONSULTATION: Bladder cancer   Oncology History Overview Note  # AUG 2019-TRANSITIONAL CELL BLADDER CA [~ 4cm tumor] s/p cystoscopy [Dr.Stoiff]  with extensive angiolymphatic invasion; [name]; [name]; mediastinal uptake; right pubic rami uptake.  # [id number] Cummings; Jan 18th 2021- switched to [facility]- [pt preference; [name]; July 2020 cystoscopy-no evidence of bladder malignancy/Dr. Bernardo Heater- enlarged prostate [PSA- 0.95; 2021]  # Molecular testing- PDL-1 CPS- 20%; [name]  ;GOALS: palliative  CURRENT/MOST RECENT THERAPY:OPDIVO [C]     Cancer of overlapping sites of bladder ([place])  02/28/2018 - 06/09/2019 Chemotherapy   The patient had atezolizumab (TECENTRIQ) 1,[name], 1,200 mg, Intravenous, Once, [name],200 mg (02/28/2018), 1,200 mg (03/21/2018), 1,200 mg (04/11/2018), 1,200 mg (05/02/2018), 1,200 mg (06/13/2018), 1,200 mg (05/23/2018), 1,200 mg (07/04/2018), 1,200 mg (07/25/2018), 1,200 mg (08/15/2018), 1,200 mg (09/05/2018), 1,200 mg (10/25/2018), 1,200 mg (11/22/2018), 1,200 mg (12/20/2018), 1,200 mg (01/10/2019), 1,200 mg  (01/31/2019), 1,200 mg (02/21/2019), 1,200 mg (03/14/2019), 1,200 mg (04/04/2019), 1,200 mg (04/25/2019), 1,200 mg (05/16/2019), 1,200 mg (06/09/2019)   for chemotherapy treatment.     06/30/2019 -  Chemotherapy   The patient had nivolumab (OPDIVO) 240 mg in sodium chloride 0.9 % 100 mL chemo infusion, 240 mg, Intravenous, Once, 22 of 23 cycles Administration: 240 mg (06/30/2019), 240 mg (07/14/2019), 240 mg (07/28/2019), 240 mg (08/11/2019), 240 mg (08/25/2019), 240 mg (09/08/2019), 240 mg (09/22/2019), 240 mg (11/03/2019), 240 mg (11/17/2019), 240 mg (12/10/2019), 240 mg (12/24/2019), 240 mg (01/08/2020), 240 mg (01/22/2020), 240 mg (02/05/2020), 240 mg (02/19/2020), 240 mg (03/04/2020), 240 mg (03/18/2020), 240 mg (04/01/2020), 240 mg (04/29/2020), 240 mg (05/13/2020), 240 mg (05/27/2020), 240 mg (06/17/2020)   for chemotherapy treatment.      HISTORY OF PRESENTING ILLNESS: Patient ambulating independently.  Accompanied by his son.  [name] 88 y.o.  male with metastatic transitional carcinoma of the bladder most recently on Opdivo [currently on hold because of poor tolerance] is here for follow-up.  In the interim patient was evaluated by pulmonary.  However in the last 2 to 3 weeks patient had a "cold"- [coughing-phlegm] s/p anti-biotcs- improved.   Mild weight loss.  Continues to be on prednisone 5 mg a day.  Appetite is good.   No new shortness of breath or cough.  Review of Systems  Constitutional:  Positive for malaise/fatigue. Negative for chills, diaphoresis and fever.  HENT:  Negative for nosebleeds and sore throat.   Eyes:  Negative for double vision.  Respiratory:  Negative for hemoptysis, sputum production and wheezing.   Cardiovascular:  Negative for chest pain, palpitations, orthopnea and leg swelling.  Gastrointestinal:  Negative for abdominal pain, blood in stool, diarrhea, heartburn, melena, nausea and vomiting.  Genitourinary:  Negative for dysuria.  Musculoskeletal:  Positive for back  pain and joint pain.  Skin: Negative.  Negative for itching and rash.  Neurological:  Negative for tingling, focal weakness and headaches.  Endo/Heme/Allergies:  Does not bruise/bleed easily.  Psychiatric/Behavioral:  Negative for depression. The patient is not nervous/anxious and does not have insomnia.     MEDICAL HISTORY:  Past Medical History:  Diagnosis Date   Anemia    Anxiety 12/31/2019   Cancer [facility])    bladder   Chronic kidney disease    Depression    Hypertension    Neuromuscular disorder ([place])    Nerve damage to left face/eye since around 2000/08/02.    SURGICAL HISTORY: Past Surgical History:  Procedure Laterality Date   CYSTOSCOPY W/ RETROGRADES Bilateral 01/25/2018   Procedure: CYSTOSCOPY WITH RETROGRADE PYELOGRAM;  [name], MD;  [name];  Service: Urology;  Laterality: Bilateral;   CYSTOSCOPY W/ URETERAL STENT PLACEMENT Bilateral 01/06/2019   Procedure: CYSTOSCOPY WITH RETROGRADE PYELOGRAM/URETERAL STENT REMOVAL;  [name], MD;  [name];  Service: Urology;  Laterality: Bilateral;   CYSTOSCOPY WITH BIOPSY N/A 09/21/2020   Procedure: CYSTOSCOPY WITH BLADDER BIOPSY;  [name], MD;  [name];  Service: Urology;  [name];   CYSTOSCOPY WITH FULGERATION N/A 09/21/2020   Procedure: CYSTOSCOPY WITH FULGERATION;  [name], MD;  [name];  Service: Urology;  [name];   CYSTOSCOPY WITH STENT PLACEMENT Bilateral 01/25/2018   Procedure: CYSTOSCOPY WITH STENT PLACEMENT;  [name], MD;  [name];  Service: Urology;  Laterality: Bilateral;   DORSAL SLIT N/A 01/06/2019   Procedure: DORSAL SLIT;  [name], MD;  [name];  Service: Urology;  [name];   ESOPHAGOGASTRODUODENOSCOPY (EGD) WITH PROPOFOL N/A 11/12/2019   Procedure: ESOPHAGOGASTRODUODENOSCOPY (EGD) WITH PROPOFOL;  Surgeon: Virgel Manifold, MD;   Location: [facility];  Service: Endoscopy;  [name];   EYE SURGERY     Cornea transplants bilaterally & cataract surgery.   TRANSURETHRAL RESECTION OF BLADDER TUMOR N/A 01/25/2018   Procedure: TRANSURETHRAL RESECTION OF BLADDER TUMOR (TURBT);  [name], MD;  [name];  Service: Urology;  [name];    SOCIAL HISTORY: Social History   Socioeconomic History   Marital status: Widowed    Spouse name: [name]   Number of children: 2   Years of education: 6th grade   Highest education level: 6th grade  Occupational History   Not on file  Tobacco Use   Smoking status: Former    Types: Cigarettes    Quit date: 02-Aug-2008    Years since quitting: 13.1   Smokeless tobacco: Current    Types: Chew   Tobacco comments:    Stopped approximately 10 years ago.  Vaping Use   Vaping Use: Never used  Substance and Sexual Activity   Alcohol use: Yes    Alcohol/week: 2.0 standard drinks    Types: 2 Cans of beer per week    Comment: occasionally   Drug use: Never   Sexual activity: Not Currently    Birth control/protection: None  Other Topics Concern   Not on file  Social History Narrative    lives in [place]; with oldest son. Wife died in 08/02/2018.  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 Pt still drives and is indepent with no serious chronic disease. Worked in [employer] Determinants  of Health   Financial Resource Strain: Low Risk    Difficulty of Paying Living Expenses: Not very hard  Food Insecurity: No Food Insecurity   Worried About Running Out of Food in the Last Year: Never true   Ran Out of Food in the Last Year: Never true  Transportation Needs: No Transportation Needs   Lack of Transportation (Medical): No   Lack of Transportation (Non-Medical): No  Physical Activity: Insufficiently Active   Days of Exercise per Week: 5 days   Minutes of Exercise per Session: 10 min   Stress: No Stress Concern Present   Feeling of Stress : Only a little  Social Connections: Unknown   Frequency of Communication with Friends and Family: More than three times a week   Frequency of Social Gatherings with Friends and Family: Three times a week   Attends Religious Services: Not on file   Active Member of Clubs or Organizations: Not on file   Attends Club or Organization Meetings: Not on file   Marital Status: Widowed  Human resources officer Violence: Not At Risk   Fear of Current or Ex-Partner: No   Emotionally Abused: No   Physically Abused: No   Sexually Abused: No    FAMILY HISTORY: Family History  Problem Relation Age of Onset   Prostate cancer Neg Hx    Kidney cancer Neg Hx    Bladder Cancer Neg Hx     ALLERGIES:  has No Known Allergies.  MEDICATIONS:  Current Outpatient Medications  Medication Sig Dispense Refill   aspirin EC 81 MG tablet Take 81 mg by mouth daily.     levothyroxine (SYNTHROID) 88 MCG tablet TAKE 1 TABLET BY MOUTH ONCE A DAY. TAKE ON AN EMPTY STOMACH WITH A GLASS OF WATER ATLEAST 30-60 MIN BEFORE BREAKFAST 90 tablet 3   loratadine (CLARITIN) 10 MG tablet Take 1 tablet (10 mg total) by mouth daily. 30 tablet 11   mometasone (NASONEX) 50 MCG/ACT nasal spray Place 2 sprays into the nose daily. 1 each 12   Multiple Vitamin (MULTIVITAMIN WITH MINERALS) TABS tablet Take 1 tablet by mouth daily.      prednisoLONE acetate (PRED FORTE) 1 % ophthalmic suspension SMARTSIG:In Eye(s)     predniSONE (DELTASONE) 5 MG tablet TAKE 1 TABLET BY MOUTH ONCE A DAY WITH BREAKFAST. 60 tablet 0   amoxicillin-clavulanate (AUGMENTIN) 875-125 MG tablet Take 1 tablet by mouth 2 (two) times daily. (Patient not taking: Reported on 07/20/2021) 14 tablet 0   No current facility-administered medications for this visit.      Marland Kitchen  PHYSICAL EXAMINATION: ECOG PERFORMANCE STATUS: 1 - Symptomatic but completely ambulatory  Vitals:   07/20/21 1000  BP: 120/69  Pulse: 68  Resp:  16  Temp: 97.6 F (36.4 C)   Filed Weights   07/20/21 1000  Weight: 129 lb 12.8 oz (58.9 kg)    Physical Exam HENT:     Head: Normocephalic and atraumatic.     Mouth/Throat:     Pharynx: No oropharyngeal exudate.  Eyes:     Pupils: Pupils are equal, round, and reactive to light.     Comments: Chronic drooping of the left eyelid.  Cardiovascular:     Rate and Rhythm: Normal rate and regular rhythm.  Pulmonary:     Effort: No respiratory distress.     Breath sounds: No wheezing.  Abdominal:     General: Bowel sounds are normal. There is no distension.     Palpations: Abdomen is soft. There is no  mass.     Tenderness: There is no abdominal tenderness. There is no guarding or rebound.  Musculoskeletal:        General: No tenderness. Normal range of motion.     Cervical back: Normal range of motion and neck supple.  Skin:    General: Skin is warm.  Neurological:     Mental Status: He is alert and oriented to person, place, and time.  Psychiatric:        Mood and Affect: Affect normal.     LABORATORY DATA:  I have reviewed the data as listed Lab Results  Component Value Date   WBC 10.5 07/20/2021   HGB 12.4 (L) 07/20/2021   HCT 37.3 (L) 07/20/2021   MCV 98.9 07/20/2021   PLT 229 07/20/2021   Recent Labs    02/16/21 0948 04/20/21 1003 07/20/21 0944  NA 137 137 138  K 3.7 4.2 4.4  CL 108 104 106  CO2 _0 GLUCOSE 93 85 92  BUN 34* 35* 32*  CREATININE 2.13* 2.18* 2.54*  CALCIUM 9.0 9.2 9.1  GFRNONAA 30* 29* 24*  PROT 6.6 6.6 7.1  ALBUMIN 3.6 4.0 3.7  AST _1 ALT _2 ALKPHOS 59 62 73  BILITOT 0.4 0.6 0.6    RADIOGRAPHIC STUDIES: I have personally reviewed the radiological images as listed and agreed with the findings in the report. No results found.   ASSESSMENT & PLAN:   Cancer of overlapping sites of bladder ([place]) # High-grade transitional cell carcinoma of the bladder metastatic to retroperitoneal lymph node.  Stage IV-SEP 6th,  2022- CT CAP-no evidence of any recurrent disease.  Continue surveillance off therapy with imaging every 6 months or so- STABLE. CT scan not done [patient not feeling well]; will reschedule the scan ASAP.  Again low clinical suspicion of progressive disease.  # Local recurrent bladder ca < 1cm agree with TURBT [match 2022] [Dr.Stoiof-cystoscopy NOV 2022]-  STABLE.    # Pulmonary: SEP 2022- CT-emphysema /bronchial thickening /bilateral basilar groundglass opacities -?  Resolving  BOOP/ fibrosis Vs. fungal infection [s/p itraconazole].  STABLE. Await repeat CT scan.     # Iatrogenic hypothyroidism-on Synthroid 88 mcg; TSH SEP 2022- WNL-STABLE; wll repeat today  # Anemia sec to CKD-III-IV/ on Iv iron.Hb-11-12-Clinically- STABLE; Hold Venofer today.  # CKD stageIII-IVGFR- 24 -Clinically  STABLE ; recommend increase fluid intake.  #Weight loss improving; continue prednisone for appetite/also above pneumonitis- 21m /day.   # IV access: NO port  # DISPOSITION:  # Re-schedule the CT scan in next 1-2 weeks.  # HOLD venofer  # Follow-up in 3 months-;MD; cbc/cmp; possible venofer Dr.B       All questions were answered. The patient knows to call the clinic with any problems, questions or concerns.    GCammie Sickle MD 07/20/2021 1:04 PM

## 2021-07-20 NOTE — Progress Notes (Signed)
Patient not feeling well for the past couple of weeks with "bad cold", chest congestion, and headache so they cancelled the CT scan last week.  Wanted to wait to see Dr. B before rescheduling the CT scan.  Is feeling a little better today.  Also has a decrease in appetite that he relates that to not feeling well lately.

## 2021-07-20 NOTE — Assessment & Plan Note (Addendum)
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  Stage IV-SEP 6th, 2022- CT CAP-no evidence of any recurrent disease.  Continue surveillance off therapy with imaging every 6 months or so- STABLE. CT scan not done [patient not feeling well]; will reschedule the scan ASAP.  Again low clinical suspicion of progressive disease.  # Local recurrent bladder ca < 1cm agree with TURBT [match 2022] [Dr.Stoiof-cystoscopy NOV 2022]-  STABLE.    # Pulmonary: SEP 2022- CT-emphysema /bronchial thickening /bilateral basilar groundglass opacities -?  Resolving  BOOP/ fibrosis Vs. fungal infection [s/p itraconazole].  STABLE. Await repeat CT scan.    # Iatrogenic hypothyroidism-on Synthroid 88 mcg; TSH SEP 2022- WNL-STABLE; wll repeat today  # Anemia sec to CKD-III-IV/ on Iv iron.Hb-11-12-Clinically- STABLE; Hold Venofer today.  # CKD stageIII-IVGFR- 24 -Clinically  STABLE ; recommend increase fluid intake.  #Weight loss improving; continue prednisone for appetite/also above pneumonitis- 5mg  /day.   # IV access: NO port  # DISPOSITION:  # Re-schedule the CT scan in next 1-2 weeks.  # HOLD venofer  # Follow-up in 3 months-;MD; cbc/cmp; possible venofer Dr.B

## 2021-07-21 ENCOUNTER — Ambulatory Visit: Payer: Medicare HMO | Admitting: Nurse Practitioner

## 2021-07-21 ENCOUNTER — Telehealth: Payer: Self-pay

## 2021-07-21 ENCOUNTER — Encounter: Payer: Self-pay | Admitting: Internal Medicine

## 2021-07-21 DIAGNOSIS — C678 Malignant neoplasm of overlapping sites of bladder: Secondary | ICD-10-CM

## 2021-07-21 MED ORDER — LEVOTHYROXINE SODIUM 100 MCG PO TABS: 100.0000 ug | ORAL_TABLET | Freq: Every day | ORAL | 1 refills | Status: DC

## 2021-07-21 NOTE — Telephone Encounter (Signed)
Synthroid 88 mcg QD is on patient's active medication list.  MD recommends Increase to 160mcg/ day.  New Synthroid rx sent.  Called to inform patient of dose change he said he was recently on antibiotic and missed a couple days of Synthroid.

## 2021-07-21 NOTE — Progress Notes (Signed)
I left a voicemail for the patients son Ronalee Belts regarding patients abnormal/elevated. TSH.  I would recommend Synthroid 50 g once a day empty stomach not to eat or drink for up an hour.  Heather, please send a prescription to his pharmacy; and also inform the patient of my above recommendations.  C- reschedule the patients appointment to a month from now MD labs CBC,CMP/thyroid profile possible venofer.

## 2021-07-21 NOTE — Telephone Encounter (Signed)
Dr. B would like for Mr. Godshall to increase the Synthroid dose.  I have talked with patient's son Ronalee Belts and advised him Synthroid dose is being increased and he is to stop taking Synthroid 64mcg.

## 2021-07-21 NOTE — Telephone Encounter (Signed)
MD progress note on 07/21/21:  I left a voicemail for the patients son Ronalee Belts regarding patients abnormal/elevated. TSH.   I would recommend Synthroid 50 g once a day empty stomach not to eat or drink for up an hour.   Breckan Cafiero, please send a prescription to his pharmacy; and also inform the patient of my above recommendations.   C- reschedule the patients appointment to a month from now MD labs CBC,CMP/thyroid profile possible venofer.   Electronically signed by Cammie Sickle, MD at 07/21/2021  2:44 PM

## 2021-07-22 ENCOUNTER — Encounter: Payer: Self-pay | Admitting: Internal Medicine

## 2021-08-02 ENCOUNTER — Ambulatory Visit (INDEPENDENT_AMBULATORY_CARE_PROVIDER_SITE_OTHER): Payer: Medicare HMO | Admitting: Internal Medicine

## 2021-08-02 ENCOUNTER — Other Ambulatory Visit: Payer: Self-pay

## 2021-08-02 ENCOUNTER — Ambulatory Visit
Admission: RE | Admit: 2021-08-02 | Discharge: 2021-08-02 | Disposition: A | Discharge status: Home / Self Care | Payer: Medicare HMO | Source: Ambulatory Visit | Attending: Internal Medicine | Admitting: Internal Medicine

## 2021-08-02 VITALS — BP 124/66 | HR 60 | Ht 68.0 in | Wt 133.6 lb

## 2021-08-02 DIAGNOSIS — F419 Anxiety disorder, unspecified: Secondary | ICD-10-CM

## 2021-08-02 DIAGNOSIS — C678 Malignant neoplasm of overlapping sites of bladder: Secondary | ICD-10-CM | POA: Insufficient documentation

## 2021-08-02 DIAGNOSIS — N1831 Chronic kidney disease, stage 3a: Secondary | ICD-10-CM | POA: Diagnosis not present

## 2021-08-02 DIAGNOSIS — C679 Malignant neoplasm of bladder, unspecified: Secondary | ICD-10-CM

## 2021-08-02 DIAGNOSIS — I10 Essential (primary) hypertension: Secondary | ICD-10-CM | POA: Diagnosis not present

## 2021-08-02 DIAGNOSIS — Z716 Tobacco abuse counseling: Secondary | ICD-10-CM | POA: Diagnosis not present

## 2021-08-02 DIAGNOSIS — R69 Illness, unspecified: Secondary | ICD-10-CM | POA: Diagnosis not present

## 2021-08-02 DIAGNOSIS — F339 Major depressive disorder, recurrent, unspecified: Secondary | ICD-10-CM

## 2021-08-02 DIAGNOSIS — J301 Allergic rhinitis due to pollen: Secondary | ICD-10-CM

## 2021-08-02 IMAGING — CT CT CHEST-ABD-PELV W/O CM
2 of 4 series · 13 of 36 positions shown, 15 images · non-contrast
Comparison: [DATE]

CLINICAL DATA: Bladder cancer. Status post TURBT. Intermittent
abdominal discomfort and constipation.



[Series 2: axials cap 5.00 · axial · 0.67mm/px · z∈[-1514,-974]mm · 10 of 130 slices shown, 12 images]
[im 11/130  mediastinal]
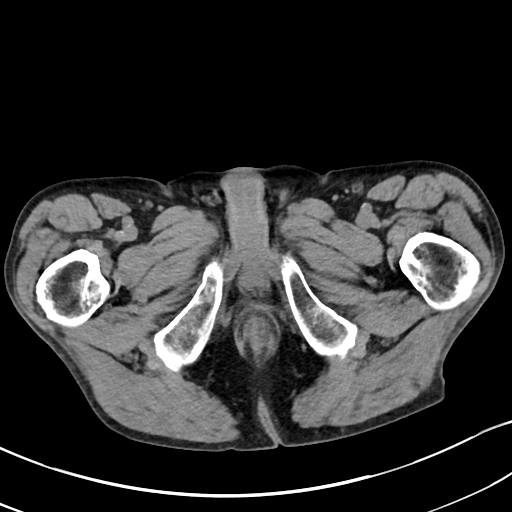
[im 11/130  bone]
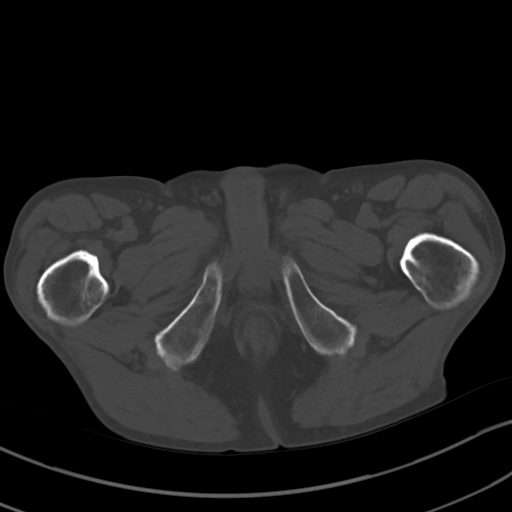
[im 22/130  mediastinal]
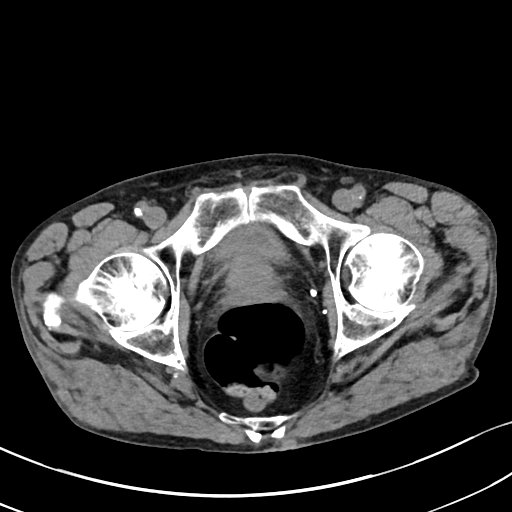
[im 33/130  mediastinal]
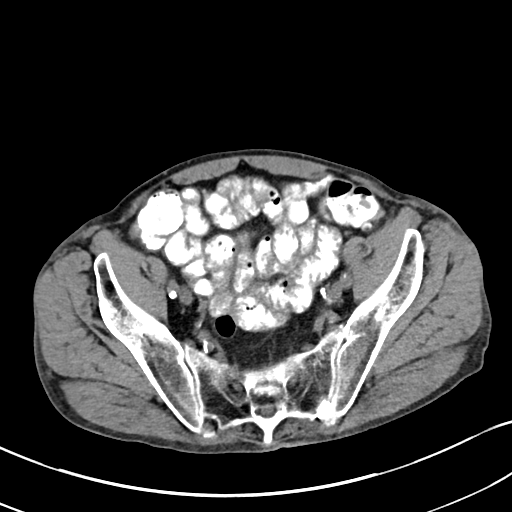
[im 44/130  mediastinal]
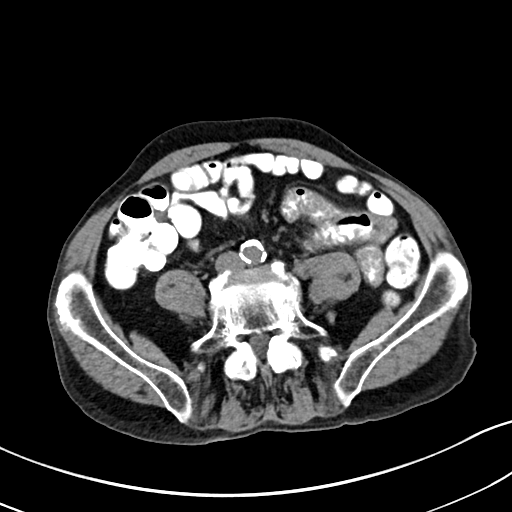
[im 54/130  mediastinal]
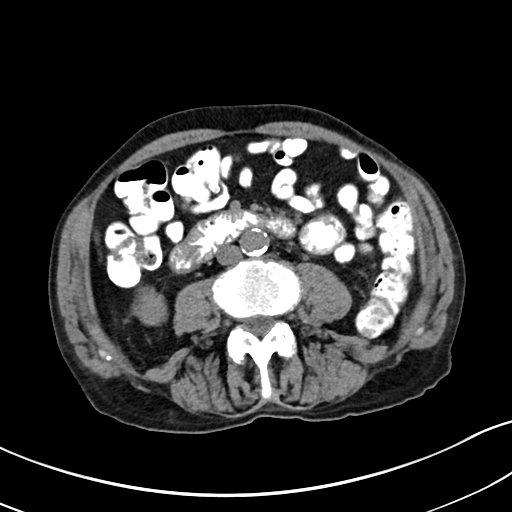
[im 76/130  mediastinal]
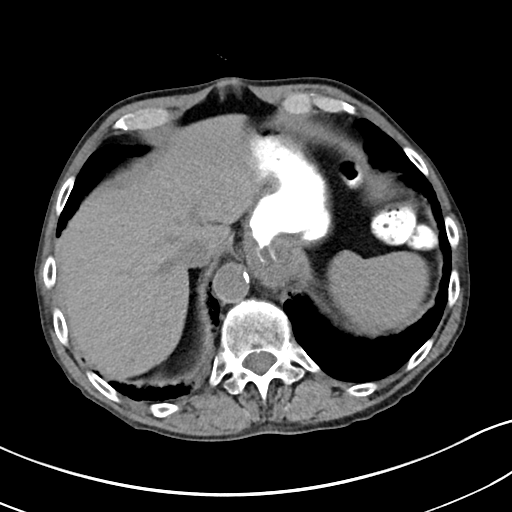
[im 87/130  mediastinal]
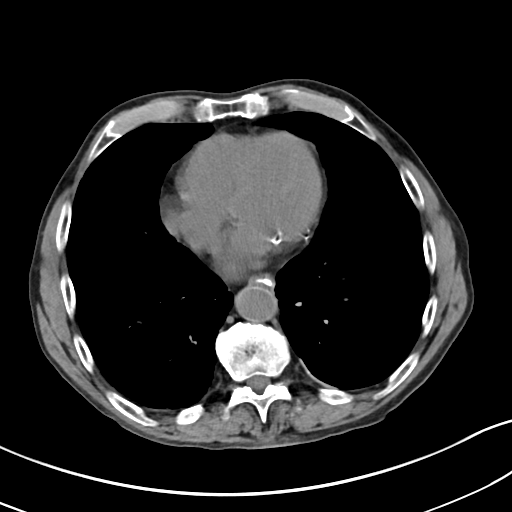
[im 97/130  mediastinal]
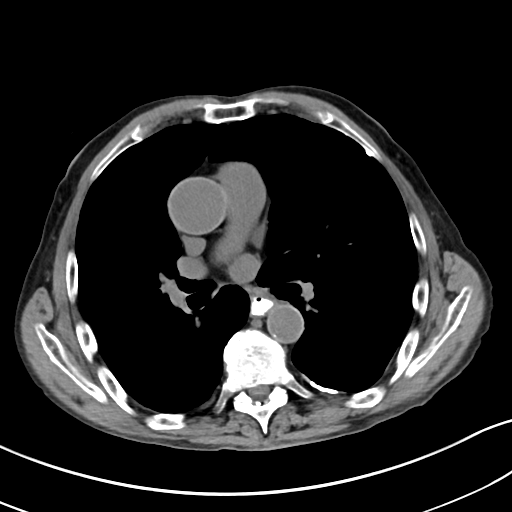
[im 108/130  mediastinal]
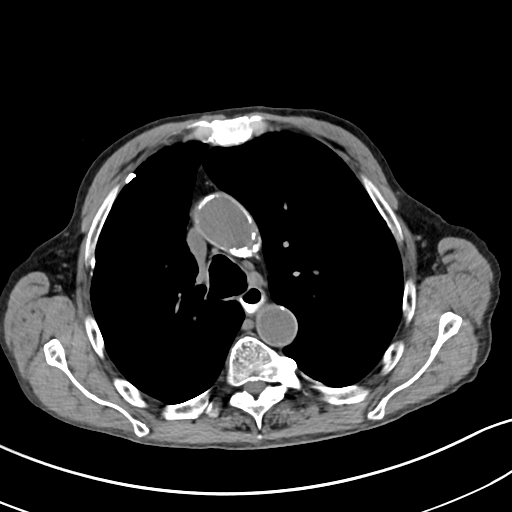
[im 108/130  bone]
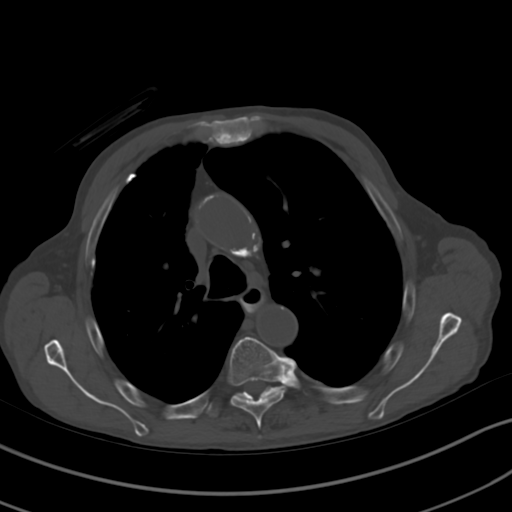
[im 119/130  mediastinal]
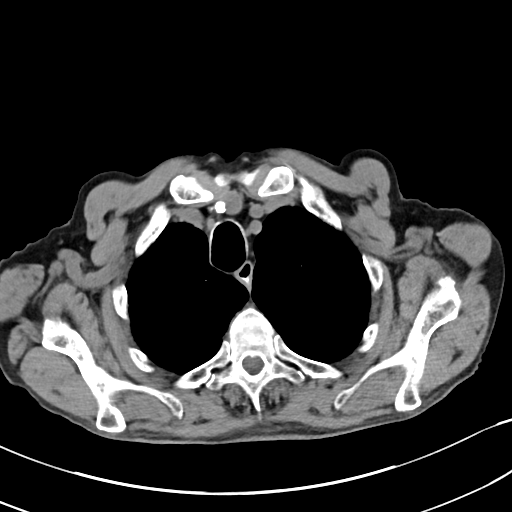

[Series 4: coronals cap 2.00 cor · coronal · 0.67mm/px · 3 of 135 slices shown]
[im 27/135  mediastinal]
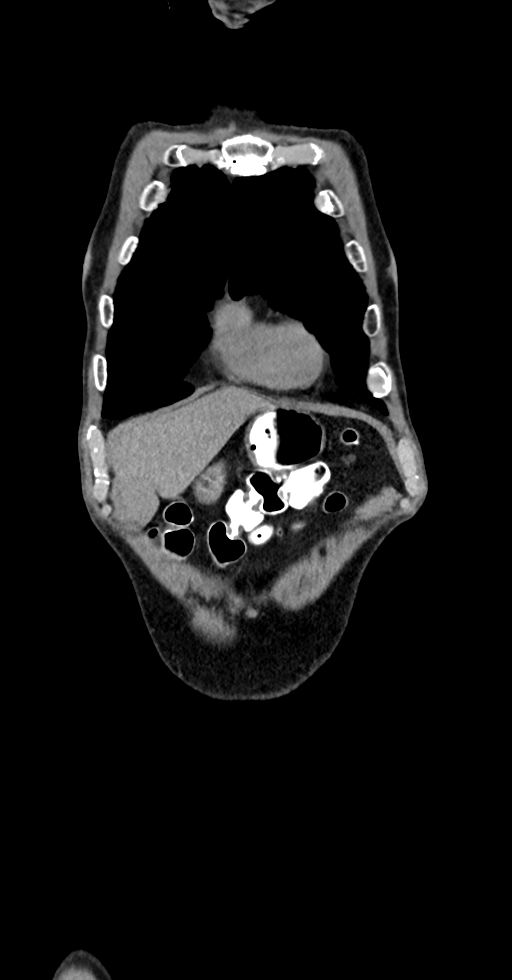
[im 54/135  mediastinal]
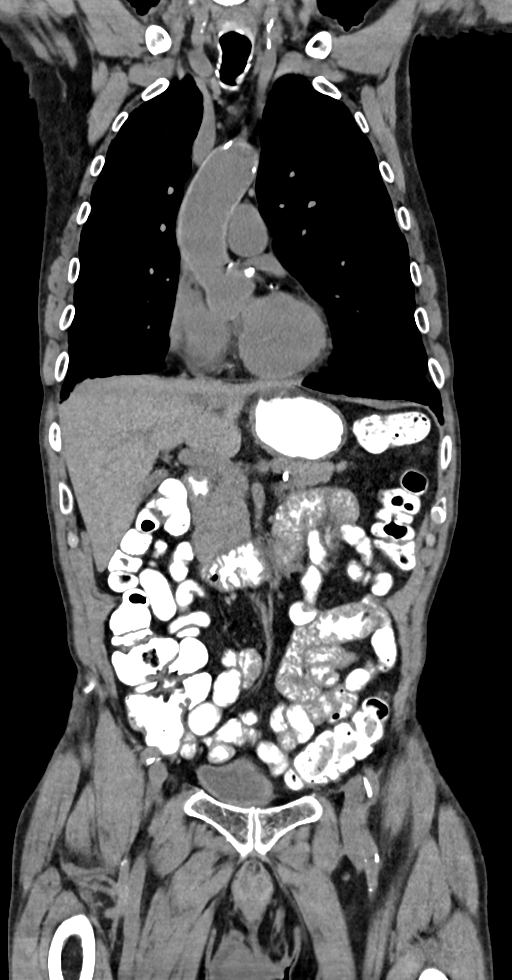
[im 81/135  mediastinal]
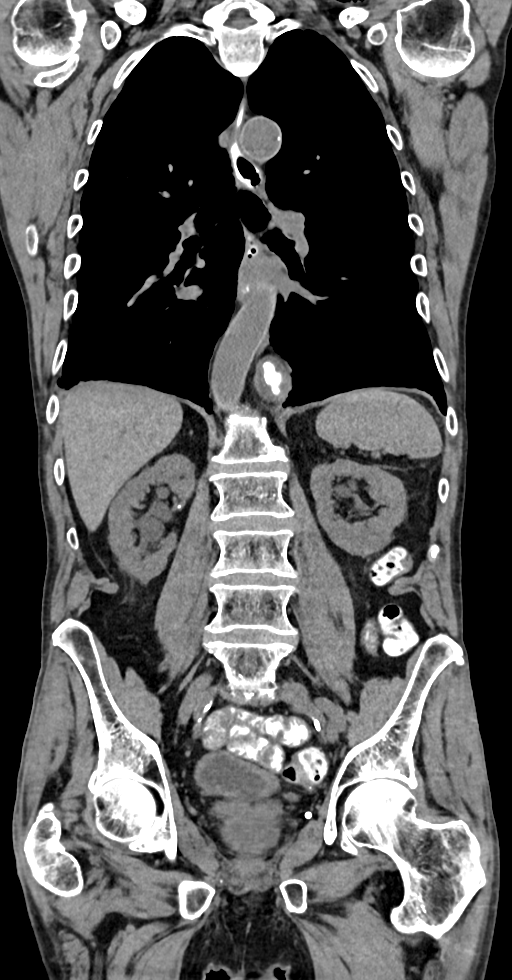

[13 of 36 positions shown; findings below may reference images not displayed]

FINDINGS: CT CHEST FINDINGS

Cardiovascular: The heart size is normal. No substantial pericardial
effusion. Coronary artery calcification is evident. Moderate
atherosclerotic calcification is noted in the wall of the thoracic
aorta.

Mediastinum/Nodes: No mediastinal lymphadenopathy. No evidence for
gross hilar lymphadenopathy although assessment is limited by the
lack of intravenous contrast on the current study. Small to moderate
hiatal hernia. There is no axillary lymphadenopathy.

Lungs/Pleura: Centrilobular emphsyema noted. Calcified pleural
plaques again noted bilaterally. 2 mm peripheral right upper lobe
nodule on 62/3 is stable in the interval, compatible with benign
etiology. No new suspicious pulmonary nodule or mass. Subpleural
reticulation noted bilaterally, basilar predominant.

Musculoskeletal: No worrisome lytic or sclerotic osseous
abnormality.

CT ABDOMEN PELVIS FINDINGS

Hepatobiliary: No suspicious focal abnormality in the liver on this
study without intravenous contrast. There is no evidence for
gallstones, gallbladder wall thickening, or pericholecystic fluid.
No intrahepatic or extrahepatic biliary dilation.

Pancreas: No focal mass lesion. No dilatation of the main duct. No
intraparenchymal cyst. No peripancreatic edema.

Spleen: No splenomegaly. No focal mass lesion.

Adrenals/Urinary Tract: No adrenal nodule or mass. Small bilateral
renal cysts are stable in the interval No evidence for hydroureter.
Mild bladder wall thickening evident, mildly irregular, specially
posterolaterally on the left.

Stomach/Bowel: Moderate hiatal hernia. Stomach is distended with
contrast material. Duodenum is normally positioned as is the
ligament of [place]. No small bowel wall thickening. No small bowel
dilatation. The terminal ileum is normal. The appendix is normal. No
gross colonic mass. No colonic wall thickening.

Vascular/Lymphatic: There is moderate atherosclerotic calcification
of the abdominal aorta without aneurysm. There is no gastrohepatic
or hepatoduodenal ligament lymphadenopathy. No retroperitoneal or
mesenteric lymphadenopathy. No pelvic sidewall lymphadenopathy.

Reproductive: The prostate gland and seminal vesicles are
unremarkable.

Other: No intraperitoneal free fluid.

Musculoskeletal: No worrisome lytic or sclerotic osseous
abnormality.
IMPRESSION: 1. No evidence for metastatic disease in the chest, abdomen, or
pelvis.
2. Mild bladder wall thickening, mildly irregular, especially
posterolaterally on the left.
3. Small to moderate hiatal hernia.
4. Bilateral calcified pleural plaques consistent with previous
asbestos exposure.
5. Aortic Atherosclerosis ([JK]-[JK]) and Emphysema ([JK]-[JK]).

## 2021-08-02 NOTE — Progress Notes (Signed)
Established Patient Office Visit  Subjective:  Patient ID: Edward Cummings, male    DOB: [birth year]  Age: 88 y.o. MRN: [id number]  CC:  Chief Complaint  Patient presents with   Hypertension    1 month follow up    HPI  Edward Cummings presents for general check Past Medical History:  Diagnosis Date   Anemia    Anxiety 12/31/2019   Cancer [facility])    bladder   Chronic kidney disease    Depression    Hypertension    Neuromuscular disorder ([place])    Nerve damage to left face/eye since around 09/05/2000.    Past Surgical History:  Procedure Laterality Date   CYSTOSCOPY W/ RETROGRADES Bilateral 01/25/2018   Procedure: CYSTOSCOPY WITH RETROGRADE PYELOGRAM;  [name], MD;  [name];  Service: Urology;  Laterality: Bilateral;   CYSTOSCOPY W/ URETERAL STENT PLACEMENT Bilateral 01/06/2019   Procedure: CYSTOSCOPY WITH RETROGRADE PYELOGRAM/URETERAL STENT REMOVAL;  [name], MD;  [name];  Service: Urology;  Laterality: Bilateral;   CYSTOSCOPY WITH BIOPSY N/A 09/21/2020   Procedure: CYSTOSCOPY WITH BLADDER BIOPSY;  [name], MD;  [name];  Service: Urology;  [name];   CYSTOSCOPY WITH FULGERATION N/A 09/21/2020   Procedure: CYSTOSCOPY WITH FULGERATION;  [name], MD;  [name];  Service: Urology;  [name];   CYSTOSCOPY WITH STENT PLACEMENT Bilateral 01/25/2018   Procedure: CYSTOSCOPY WITH STENT PLACEMENT;  [name], MD;  [name];  Service: Urology;  Laterality: Bilateral;   DORSAL SLIT N/A 01/06/2019   Procedure: DORSAL SLIT;  [name], MD;  [name];  Service: Urology;  [name];   ESOPHAGOGASTRODUODENOSCOPY (EGD) WITH PROPOFOL N/A 11/12/2019   Procedure: ESOPHAGOGASTRODUODENOSCOPY (EGD) WITH PROPOFOL;  Surgeon: Virgel Manifold, MD;  Location: [facility];  Service: Endoscopy;  [name];   EYE SURGERY      Cornea transplants bilaterally & cataract surgery.   TRANSURETHRAL RESECTION OF BLADDER TUMOR N/A 01/25/2018   Procedure: TRANSURETHRAL RESECTION OF BLADDER TUMOR (TURBT);  [name], MD;  [name];  Service: Urology;  [name];    Family History  Problem Relation Age of Onset   Prostate cancer Neg Hx    Kidney cancer Neg Hx    Bladder Cancer Neg Hx     Social History   Socioeconomic History   Marital status: Widowed    Spouse name: [name]   Number of children: 2   Years of education: 6th grade   Highest education level: 6th grade  Occupational History   Not on file  Tobacco Use   Smoking status: Former    Types: Cigarettes    Quit date: 2008-09-05    Years since quitting: 13.1   Smokeless tobacco: Current    Types: Chew   Tobacco comments:    Stopped approximately 10 years ago.  Vaping Use   Vaping Use: Never used  Substance and Sexual Activity   Alcohol use: Yes    Alcohol/week: 2.0 standard drinks    Types: 2 Cans of beer per week    Comment: occasionally   Drug use: Never   Sexual activity: Not Currently    Birth control/protection: None  Other Topics Concern   Not on file  Social History Narrative    lives in [place]; with oldest son. Wife died in 2018/09/06.  quit smoking 18 years ago; beer every 2  months or so.mechanic/retd.  Very supportive family. Pt still drives and is indepent with no serious chronic disease. Worked in [employer] Strain: Low Risk    Difficulty of Paying Living Expenses: Not very hard  Food Insecurity: No Food Insecurity   Worried About Charity fundraiser in the Last Year: Never true   Arboriculturist in the Last Year: Never true  Transportation Needs: No Transportation Needs   Lack of Transportation (Medical): No   Lack of Transportation (Non-Medical): No  Physical Activity: Insufficiently Active   Days of Exercise per Week: 5 days   Minutes of  Exercise per Session: 10 min  Stress: No Stress Concern Present   Feeling of Stress : Only a little  Social Connections: Unknown   Frequency of Communication with Friends and Family: More than three times a week   Frequency of Social Gatherings with Friends and Family: Three times a week   Attends Religious Services: Not on file   Active Member of Clubs or Organizations: Not on file   Attends Club or Organization Meetings: Not on file   Marital Status: Widowed  Human resources officer Violence: Not At Risk   Fear of Current or Ex-Partner: No   Emotionally Abused: No   Physically Abused: No   Sexually Abused: No     Current Outpatient Medications:    aspirin EC 81 MG tablet, Take 81 mg by mouth daily., Disp: , Rfl:    citalopram (CELEXA) 10 MG tablet, Take 1 tablet (10 mg total) by mouth daily., Disp: 30 tablet, Rfl: 3   levothyroxine (SYNTHROID) 100 MCG tablet, Take 1 tablet (100 mcg total) by mouth daily before breakfast. Take on an empty stomach/do not to eat or drink for an hour., Disp: 30 tablet, Rfl: 1   loratadine (CLARITIN) 10 MG tablet, Take 1 tablet (10 mg total) by mouth daily., Disp: 30 tablet, Rfl: 11   mometasone (NASONEX) 50 MCG/ACT nasal spray, Place 2 sprays into the nose daily., Disp: 1 each, Rfl: 12   Multiple Vitamin (MULTIVITAMIN WITH MINERALS) TABS tablet, Take 1 tablet by mouth daily. , Disp: , Rfl:    prednisoLONE acetate (PRED FORTE) 1 % ophthalmic suspension, SMARTSIG:In Eye(s), Disp: , Rfl:    predniSONE (DELTASONE) 5 MG tablet, TAKE 1 TABLET BY MOUTH ONCE A DAY WITH BREAKFAST., Disp: 60 tablet, Rfl: 0   No Known Allergies  ROS Review of Systems  Constitutional: Negative.   HENT: Negative.    Eyes: Negative.   Respiratory: Negative.    Cardiovascular: Negative.   Gastrointestinal: Negative.   Endocrine: Negative.   Genitourinary: Negative.   Musculoskeletal: Negative.   Skin: Negative.   Allergic/Immunologic: Negative.   Neurological: Negative.    Hematological: Negative.   Psychiatric/Behavioral: Negative.    All other systems reviewed and are negative.    Objective:    Physical Exam Vitals reviewed.  Constitutional:      Appearance: Normal appearance.  HENT:     Mouth/Throat:     Mouth: Mucous membranes are moist.  Eyes:     Pupils: Pupils are equal, round, and reactive to light.  Neck:     Vascular: No carotid bruit.  Cardiovascular:     Rate and Rhythm: Normal rate and regular rhythm.     Pulses: Normal pulses.     Heart sounds: Normal heart sounds.  Pulmonary:     Effort: Pulmonary effort is normal.  Breath sounds: Normal breath sounds.  Abdominal:     General: Bowel sounds are normal.     Palpations: Abdomen is soft. There is no hepatomegaly, splenomegaly or mass.     Tenderness: There is no abdominal tenderness.     Hernia: No hernia is present.  Musculoskeletal:     Cervical back: Neck supple.     Right lower leg: No edema.     Left lower leg: No edema.  Skin:    Findings: No rash.  Neurological:     Mental Status: He is alert and oriented to person, place, and time.     Motor: No weakness.  Psychiatric:        Mood and Affect: Mood normal.        Behavior: Behavior normal.    BP 124/66    Pulse 60    Ht 5\' 8"  (1.727 m)    Wt 133 lb 9.6 oz (60.6 kg)    BMI 20.31 kg/m  Wt Readings from Last 3 Encounters:  08/02/21 133 lb 9.6 oz (60.6 kg)  07/20/21 129 lb 12.8 oz (58.9 kg)  07/12/21 134 lb 8 oz (61 kg)     Health Maintenance Due  Topic Date Due   COVID-19 Vaccine (4 - Booster for Pfizer series) 05/04/2020    There are no preventive care reminders to display for this patient.  Lab Results  Component Value Date   TSH 10.008 (H) 07/20/2021   Lab Results  Component Value Date   WBC 10.5 07/20/2021   HGB 12.4 (L) 07/20/2021   HCT 37.3 (L) 07/20/2021   MCV 98.9 07/20/2021   PLT 229 07/20/2021   Lab Results  Component Value Date   NA 138 07/20/2021   K 4.4 07/20/2021   CO2 25  07/20/2021   GLUCOSE 92 07/20/2021   BUN 32 (H) 07/20/2021   CREATININE 2.54 (H) 07/20/2021   BILITOT 0.6 07/20/2021   ALKPHOS 73 07/20/2021   AST 31 07/20/2021   ALT 31 07/20/2021   PROT 7.1 07/20/2021   ALBUMIN 3.7 07/20/2021   CALCIUM 9.1 07/20/2021   ANIONGAP 7 07/20/2021   No results found for: CHOL No results found for: HDL No results found for: LDLCALC No results found for: TRIG No results found for: CHOLHDL Lab Results  Component Value Date   HGBA1C 4.5 (L) 04/13/2018      Assessment & Plan:   Problem List Items Addressed This Visit       Cardiovascular and Mediastinum   Hypertension - Primary     Patient denies any chest pain or shortness of breath there is no history of palpitation or paroxysmal nocturnal dyspnea   patient was advised to follow low-salt low-cholesterol diet    ideally I want to keep systolic blood pressure below 130 mmHg, patient was asked to check blood pressure one times a week and give me a report on that.  Patient will be follow-up in 3 months  or earlier as needed, patient will call me back for any change in the cardiovascular symptoms Patient was advised to buy a book from local bookstore concerning blood pressure and read several chapters  every day.  This will be supplemented by some of the material we will give him from the office.  Patient should also utilize other resources like YouTube and Internet to learn more about the blood pressure and the diet.        Respiratory   Seasonal allergic rhinitis due to pollen  Take Claritin 5 mg p.o. daily        Genitourinary   Urothelial carcinoma of bladder (HCC)   Stage 3a chronic kidney disease (HCC)    Stable at the present time        Other   Anxiety    - Patient experiencing high levels of anxiety.  - Encouraged patient to engage in relaxing activities like yoga, meditation, journaling, going for a walk, or participating in a hobby.  - Encouraged patient to reach out to trusted  friends or family members about recent struggles, Patient was advised to read A book, how to stop worrying and start living, it is good book to read to control  the stress       Relevant Medications   citalopram (CELEXA) 10 MG tablet   Tobacco abuse counseling    - I instructed the patient to stop smoking and provided them with smoking cessation materials.  - I informed the patient that smoking puts them at increased risk for cancer, COPD, hypertension, and more.  - Informed the patient to seek help if they begin to have trouble breathing, develop chest pain, start to cough up blood, feel faint, or pass out.      Other Visit Diagnoses     Recurrent major depressive disorder, remission status unspecified ([place])       Relevant Medications   citalopram (CELEXA) 10 MG tablet       Meds ordered this encounter  Medications   citalopram (CELEXA) 10 MG tablet    Sig: Take 1 tablet (10 mg total) by mouth daily.    Dispense:  30 tablet    Refill:  3    Follow-up: No follow-ups on file.    [name], MD

## 2021-08-04 ENCOUNTER — Encounter: Payer: Self-pay | Admitting: Internal Medicine

## 2021-08-04 MED ORDER — CITALOPRAM HYDROBROMIDE 10 MG PO TABS: 10.0000 mg | ORAL_TABLET | Freq: Every day | ORAL | 3 refills | Status: DC

## 2021-08-04 NOTE — Assessment & Plan Note (Signed)
Take Claritin 5 mg p.o. daily 

## 2021-08-04 NOTE — Assessment & Plan Note (Signed)

## 2021-08-04 NOTE — Assessment & Plan Note (Signed)
Stable at the present time. 

## 2021-08-04 NOTE — Assessment & Plan Note (Signed)
-   Patient experiencing high levels of anxiety.  - Encouraged patient to engage in relaxing activities like yoga, meditation, journaling, going for a walk, or participating in a hobby.  - Encouraged patient to reach out to trusted friends or family members about recent struggles, Patient was advised to read A book, how to stop worrying and start living, it is good book to read to control  the stress  

## 2021-08-04 NOTE — Assessment & Plan Note (Signed)
-   I instructed the patient to stop smoking and provided them with smoking cessation materials.  - I informed the patient that smoking puts them at increased risk for cancer, COPD, hypertension, and more.  - Informed the patient to seek help if they begin to have trouble breathing, develop chest pain, start to cough up blood, feel faint, or pass out.  

## 2021-08-08 NOTE — Progress Notes (Signed)
Spoke to patients son, regarding results of the CT scan negative for any recurrent disease. Discussed incidental findings. Keep appointment as planned in three months.

## 2021-08-22 ENCOUNTER — Other Ambulatory Visit: Payer: Self-pay | Admitting: Internal Medicine

## 2021-09-12 ENCOUNTER — Other Ambulatory Visit: Payer: Self-pay | Admitting: *Deleted

## 2021-09-12 NOTE — Patient Outreach (Signed)
[facility]) Care Management ? ?09/12/2021 ? ?[name] ?1932-09-07 ?[id number] ? ?[facility]) Care Management ?RN Health Coach Note ? ? ?09/12/2021 ?Name:  Edward Cummings MRN:  [id number] DOB:  1932-08-28 ? ?Summary: ?Patient reports that he is feeling good. He stays active by assisting his son in his [place]. Patient states he is taking his B/P occasionally. Nurse reviewed PCP AVS from 08/02/21 which states for the patient to take his B/P weekly, record the values, and bring the recording to his next PCP appointment; systolic goal of below 211. Patient states he has a good appetite and that he is maintaining his weight. Nurse encouraged him to drink at least 1 Ensure per day. Patient did not have any further questions or concerns today and did confirm that he has this nurse's contact number to call her if needed.  ? ?Recommendations/Changes made from today's visit: ?Drink 1-2 Ensure supplements a day ?Drink 2-3 bottles of water a day ?Eat high calorie and high protein foods frequently throughout the day ?Weigh 3 times a week to help maintain weight at 135 pounds or greater ?Make an eye examination appointment for your eye health ?Dr. Lavera Guise wants you to take your blood pressure at least once a week, write down the reading, and bring all of the B/P readings to your next doctor appointment ? ?Subjective: ?Edward Cummings is an 88 y.o. year old male who is a primary patient of Masoud, Viann Shove, MD. The care management team was consulted for assistance with care management and/or care coordination needs.   ? ?RN Health Coach completed Telephone Visit today.  ? ?Objective: ? ?Medications Reviewed Today   ? ? Reviewed by Michiel Cowboy, RN (Registered Nurse) on 09/12/21 at [place] List Status: <None>  ? ?Medication Order Taking? Sig Documenting Provider Last Dose Status Informant  ?aspirin EC 81 MG tablet [id number] Yes Take 81 mg by mouth daily. [provider]  Taking Active Family Member  ?         ?Med Note Ronnald Ramp, HEATHER R   Fri Jan 10, 2019  9:07 AM)    ?citalopram (CELEXA) 10 MG tablet [id number] Yes Take 1 tablet (10 mg total) by mouth daily. [name], MD Taking Active   ?levothyroxine (SYNTHROID) 100 MCG tablet [id number] Yes TAKE 1 TAB BY MOUTH ONCE DAILY. TAKE ON AN EMPTY STOMACH WITH A GLASS OF WATER ATLEAST 30-60 MINUTES BEFORE BREAKFAST Cammie Sickle, MD Taking Active   ?loratadine (CLARITIN) 10 MG tablet [id number] Yes Take 1 tablet (10 mg total) by mouth daily. [name], MD Taking Active   ?mometasone (NASONEX) 50 MCG/ACT nasal spray [id number] Yes Place 2 sprays into the nose daily. [name], MD Taking Active   ?Multiple Vitamin (MULTIVITAMIN WITH MINERALS) TABS tablet [id number] Yes Take 1 tablet by mouth daily.  [provider] Taking Active Family Member  ?prednisoLONE acetate (PRED FORTE) 1 % ophthalmic suspension [id number] Yes SMARTSIG:In Eye(s) [provider] Taking Active   ?predniSONE (DELTASONE) 5 MG tablet [id number] Yes TAKE 1 TABLET BY MOUTH ONCE A DAY WITH BREAKFAST. Cammie Sickle, MD Taking Active   ? ?  ?  ? ?  ? ? ? ?SDOH:  (Social Determinants of Health) assessments and interventions performed: SDOH assessments completed today and documented in the Epic system. ?  ? ?Care Plan ? ?Review of patient past medical history, allergies, medications, health status, including review of consultants reports, laboratory and other test data,  was performed as part of comprehensive evaluation for care management services.  ? ?Care Plan : RN Care Manager Plan of Care  ?Updates made by Michiel Cowboy, RN since 09/12/2021 12:00 AM  ?  ? ?Problem: Knowledge Deficit Related to Hypertension and  Increasing BMI   ?Priority: High  ?  ? ?Long-Range Goal: Development of Plan of Care for Management of Hypertension and Increasing BMI   ?Start Date: 06/23/2021  ?Expected End Date: 07/11/2021  ?Priority: High  ?Note:   ?Current  Barriers:  ?Knowledge Deficits related to plan of care for management of HTN  and increasing BMI ? ?RNCM Clinical Goal(s):  ?Patient will demonstrate Improved adherence to prescribed treatment plan for HTN and maintaining weight at 135 or higher as evidenced by beginning to monitor B/P 1-3 times a week; beginning to drink 1-2 Ensures a day; weighing 3 times a week to ensure weight is at goal; taking medications as prescribed; contacting providers for any questions and concerns ?continue to work with [place] to address care management and care coordination needs related to  HTN and maintaining weight at 135 or higher as evidenced by adherence to CM Team Scheduled appointments through collaboration with RN Care manager, provider, and care team.  ? ?Interventions: ?Inter-disciplinary care team collaboration (see longitudinal plan of care) ?Evaluation of current treatment plan related to  self management and patient's adherence to plan as established by provider ? ? ?Hypertension Interventions:  (Status:  New goal.) Long Term Goal ?Last practice recorded BP readings:  ?BP Readings from Last 3 Encounters:  ?04/25/21 (!) 157/80  ?04/20/21 (!) 144/65  ?03/21/21 127/69  ?Most recent eGFR/CrCl: No results found for: EGFR  No components found for: CRCL ? 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 ?Weight Gain RNCM Interventions: ?- Discussed drinking 1-2 Ensures a  day; Nurse will send Ensure coupons ?- Encouraged patient to eat high calorie and high protein foods frequently throughout the day; nurse will send education ?- Discussed Drinking 2-3 bottles of water a day to stay hydrated ?- Discussed weighing 3 times a week to ensure his weight is being maintained at 135 or greater ?- Set a goal of 135 pounds or greater ? ?Patient Goals/Self-Care Activities: ?Take all medications as prescribed ?Attend all scheduled provider appointments ?check blood pressure 3 times per week ?write blood pressure results in a log or diary ?learn about high blood pressure ?take blood pressure log to all doctor appointments ?keep all doctor appointments ?Continue to stay active by not sitting for long periods and helping your son in his shop ?Limit the amount of sodium in your diet ?Drink 1-2 Ensure supplements a day ?Drink 2-3 bottles of water a day ?Eat high calorie and high protein foods frequently throughout the day ?Weigh 3 times a week to help maintain weight at 135 pounds or greater ?Make an eye examination appointment for your eye health ?Use Neti Pot to clear nasal passages; they are available at the drug store ?Dr. Lavera Guise wants you to take your blood pressure at  least once a week, write down the reading, and bring all of the B/P readings to your next doctor appointment ? ?Follow Up Plan:  Telephone follow up appointment with care management team member scheduled for:  June ? ?  ?  ? ?Plan: Telephone follow up appointment with care management team member scheduled for:  June. Nurse will send PCP a quarterly update. ? ?Emelia Loron RN, BSN ?Nurse Health Coach ?[place] ?[phone] ?[email or web address] ? ? ? ? ? ? ?

## 2021-09-12 NOTE — Patient Instructions (Addendum)
Visit Information ? ?Thank you for taking time to visit with me today. Please don't hesitate to contact me if I can be of assistance to you before our next scheduled telephone appointment. ? 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 Lavera Guise wants you to take your blood pressure once a week, write down the reading, and bring all of the B/P readings to your next doctor appointment; goal is systolic below 902 ? ? ?The patient verbalized understanding of instructions, educational materials, and care plan provided today and agreed to receive a mailed copy of patient instructions, educational materials, and care plan.  ? ?Telephone follow up appointment with care management team member scheduled for: ? ?Emelia Loron RN, BSN ?Nurse Health Coach ?[place] ?[phone] ?[email or web address] ? ? ?  ?

## 2021-09-20 ENCOUNTER — Other Ambulatory Visit: Payer: Self-pay | Admitting: Internal Medicine

## 2021-10-20 ENCOUNTER — Inpatient Hospital Stay: Payer: Medicare HMO | Admitting: Internal Medicine

## 2021-10-20 ENCOUNTER — Inpatient Hospital Stay: Payer: Medicare HMO

## 2021-10-20 ENCOUNTER — Inpatient Hospital Stay: Payer: Medicare HMO | Attending: Internal Medicine

## 2021-10-20 DIAGNOSIS — Z7951 Long term (current) use of inhaled steroids: Secondary | ICD-10-CM | POA: Diagnosis not present

## 2021-10-20 DIAGNOSIS — Z7982 Long term (current) use of aspirin: Secondary | ICD-10-CM | POA: Diagnosis not present

## 2021-10-20 DIAGNOSIS — C772 Secondary and unspecified malignant neoplasm of intra-abdominal lymph nodes: Secondary | ICD-10-CM | POA: Diagnosis not present

## 2021-10-20 DIAGNOSIS — D631 Anemia in chronic kidney disease: Secondary | ICD-10-CM | POA: Insufficient documentation

## 2021-10-20 DIAGNOSIS — Z79899 Other long term (current) drug therapy: Secondary | ICD-10-CM | POA: Insufficient documentation

## 2021-10-20 DIAGNOSIS — C678 Malignant neoplasm of overlapping sites of bladder: Secondary | ICD-10-CM

## 2021-10-20 DIAGNOSIS — E039 Hypothyroidism, unspecified: Secondary | ICD-10-CM | POA: Diagnosis not present

## 2021-10-20 DIAGNOSIS — I129 Hypertensive chronic kidney disease with stage 1 through stage 4 chronic kidney disease, or unspecified chronic kidney disease: Secondary | ICD-10-CM | POA: Insufficient documentation

## 2021-10-20 DIAGNOSIS — Z79890 Hormone replacement therapy (postmenopausal): Secondary | ICD-10-CM | POA: Insufficient documentation

## 2021-10-20 DIAGNOSIS — N183 Chronic kidney disease, stage 3 unspecified: Secondary | ICD-10-CM | POA: Diagnosis not present

## 2021-10-20 LAB — CBC WITH DIFFERENTIAL/PLATELET
Abs Immature Granulocytes: 0.03 10*3/uL (ref 0.00–0.07)
Basophils Absolute: 0.1 10*3/uL (ref 0.0–0.1)
Basophils Relative: 1 %
Eosinophils Absolute: 0.3 10*3/uL (ref 0.0–0.5)
Eosinophils Relative: 4 %
HCT: 33.3 % — ABNORMAL LOW (ref 39.0–52.0)
Hemoglobin: 10.9 g/dL — ABNORMAL LOW (ref 13.0–17.0)
Immature Granulocytes: 0 %
Lymphocytes Relative: 26 %
Lymphs Abs: 2.4 10*3/uL (ref 0.7–4.0)
MCH: 32.3 pg (ref 26.0–34.0)
MCHC: 32.7 g/dL (ref 30.0–36.0)
MCV: 98.8 fL (ref 80.0–100.0)
Monocytes Absolute: 0.9 10*3/uL (ref 0.1–1.0)
Monocytes Relative: 10 %
Neutro Abs: 5.5 10*3/uL (ref 1.7–7.7)
Neutrophils Relative %: 59 %
Platelets: 219 10*3/uL (ref 150–400)
RBC: 3.37 MIL/uL — ABNORMAL LOW (ref 4.22–5.81)
RDW: 13.1 % (ref 11.5–15.5)
WBC: 9.2 10*3/uL (ref 4.0–10.5)
nRBC: 0 % (ref 0.0–0.2)

## 2021-10-20 LAB — COMPREHENSIVE METABOLIC PANEL
ALT: 16 U/L (ref 0–44)
AST: 17 U/L (ref 15–41)
Albumin: 3.7 g/dL (ref 3.5–5.0)
Alkaline Phosphatase: 60 U/L (ref 38–126)
Anion gap: 4 — ABNORMAL LOW (ref 5–15)
BUN: 32 mg/dL — ABNORMAL HIGH (ref 8–23)
CO2: 23 mmol/L (ref 22–32)
Calcium: 8.9 mg/dL (ref 8.9–10.3)
Chloride: 108 mmol/L (ref 98–111)
Creatinine, Ser: 2.26 mg/dL — ABNORMAL HIGH (ref 0.61–1.24)
GFR, Estimated: 27 mL/min — ABNORMAL LOW (ref >60)
Glucose, Bld: 95 mg/dL (ref 70–99)
Potassium: 4.4 mmol/L (ref 3.5–5.1)
Sodium: 135 mmol/L (ref 135–145)
Total Bilirubin: 0.6 mg/dL (ref 0.3–1.2)
Total Protein: 6.6 g/dL (ref 6.5–8.1)

## 2021-10-20 NOTE — Progress Notes (Signed)
Patient reports constipation that was not relieved with stool softener or Miralax. ?

## 2021-10-20 NOTE — Progress Notes (Signed)
[place] ?CONSULT NOTE ? ?Patient Care Team: ?[name], MD as PCP - General (Internal Medicine) ?Cammie Sickle, MD as Consulting Physician (Hematology and Oncology) ?Virgel Manifold, MD (Inactive) as Consulting Physician (Gastroenterology) ?Clyde Canterbury, MD as Referring Physician (Otolaryngology) ?Ottie Glazier, MD as Consulting Physician (Pulmonary Disease) ?Stoioff, Ronda Fairly, MD (Urology) ?Wine, Argie Ramming, RN as [facility] ? ?CHIEF COMPLAINTS/PURPOSE OF CONSULTATION: Bladder cancer ? ? ?Oncology History Overview Note  ?# AUG 2019-TRANSITIONAL CELL BLADDER CA [~ 4cm tumor] s/p cystoscopy [Dr.Stoiff]  with extensive angiolymphatic invasion; [name]; [name]; mediastinal uptake; right pubic rami uptake. ? ?# [name]; Jan 18th 2021- switched to [place]- [pt preference; q2W] ? ? ?# Match 2020- HYPOTHYROIDISM [sec to [place]] ? ?# CKD stage III-IV [creat 2.5]; July 2020 cystoscopy-no evidence of bladder malignancy/Dr. Bernardo Heater- enlarged prostate [PSA- 0.95; 2021] ? ?# Molecular testing- PDL-1 CPS- 20%; NO other targets** ? ?# Palliative care referral: P ? ?DIAGNOSIS: Bladder ca ? ?STAGE:   IV  ;GOALS: palliative ? ?CURRENT/MOST RECENT THERAPY:OPDIVO [C] ? ? ?  ?Cancer of overlapping sites of bladder [facility])  ?02/28/2018 - 06/09/2019 Chemotherapy  ? The patient had atezolizumab (TECENTRIQ) 1,[name], 1,200 mg, Intravenous, Once, 21 of 22 cycles ?Administration: 1,200 mg (02/28/2018), 1,200 mg (03/21/2018), 1,200 mg (04/11/2018), 1,200 mg (05/02/2018), 1,200 mg (06/13/2018), 1,200 mg (05/23/2018), 1,200 mg (07/04/2018), 1,200 mg (07/25/2018), 1,200 mg (08/15/2018), 1,200 mg (09/05/2018), 1,200 mg (10/25/2018), 1,200 mg (11/22/2018), 1,200 mg (12/20/2018), 1,200 mg (01/10/2019), 1,200 mg  (01/31/2019), 1,200 mg (02/21/2019), 1,200 mg (03/14/2019), 1,200 mg (04/04/2019), 1,200 mg (04/25/2019), 1,200 mg (05/16/2019), 1,200 mg (06/09/2019) ? ? for chemotherapy treatment.  ? ?  ?06/30/2019 -  Chemotherapy  ? The patient had nivolumab (OPDIVO) 240 mg in sodium chloride 0.9 % 100 mL chemo infusion, 240 mg, Intravenous, Once, 22 of 23 cycles ?Administration: 240 mg (06/30/2019), 240 mg (07/14/2019), 240 mg (07/28/2019), 240 mg (08/11/2019), 240 mg (08/25/2019), 240 mg (09/08/2019), 240 mg (09/22/2019), 240 mg (11/03/2019), 240 mg (11/17/2019), 240 mg (12/10/2019), 240 mg (12/24/2019), 240 mg (01/08/2020), 240 mg (01/22/2020), 240 mg (02/05/2020), 240 mg (02/19/2020), 240 mg (03/04/2020), 240 mg (03/18/2020), 240 mg (04/01/2020), 240 mg (04/29/2020), 240 mg (05/13/2020), 240 mg (05/27/2020), 240 mg (06/17/2020) ? ? for chemotherapy treatment.  ? ?  ? ?HISTORY OF PRESENTING ILLNESS: Patient ambulating independently.  Accompanied by his son. ? ?Edward Cummings 88 y.o.  male with metastatic transitional carcinoma of the bladder most recently on Opdivo [currently on hold because of poor tolerance] is here for follow-up/CT scan. ? ?Appetite is good.  No weight loss.  No nausea no vomiting.Continues to be on prednisone 5 mg a day.  No blood in stools or black-colored stools.  No new shortness of breath or cough. ? ?Review of Systems  ?Constitutional:  Positive for malaise/fatigue. Negative for chills, diaphoresis and fever.  ?HENT:  Negative for nosebleeds and sore throat.   ?Eyes:  Negative for double vision.  ?Respiratory:  Negative for hemoptysis, sputum production and wheezing.   ?Cardiovascular:  Negative for chest pain, palpitations, orthopnea and leg swelling.  ?Gastrointestinal:  Negative for abdominal pain, blood in stool, diarrhea, heartburn, melena, nausea and vomiting.  ?Genitourinary:  Negative for dysuria.  ?Musculoskeletal:  Positive for back pain and joint pain.  ?Skin: Negative.  Negative for itching and rash.  ?  Neurological:   Negative for tingling, focal weakness and headaches.  ?Endo/Heme/Allergies:  Does not bruise/bleed easily.  ?Psychiatric/Behavioral:  Negative for depression. The patient is not nervous/anxious and does not have insomnia.    ? ?MEDICAL HISTORY:  ?Past Medical History:  ?Diagnosis Date  ? Anemia   ? Anxiety 12/31/2019  ? Cancer [facility]   ? bladder  ? Chronic kidney disease   ? Depression   ? Hypertension   ? Neuromuscular disorder ([place])   ? Nerve damage to left face/eye since around Aug 06, 2000.  ? ? ?SURGICAL HISTORY: ?Past Surgical History:  ?Procedure Laterality Date  ? CYSTOSCOPY W/ RETROGRADES Bilateral 01/25/2018  ? Procedure: CYSTOSCOPY WITH RETROGRADE PYELOGRAM;  [name], MD;  [name];  Service: Urology;  Laterality: Bilateral;  ? CYSTOSCOPY W/ URETERAL STENT PLACEMENT Bilateral 01/06/2019  ? Procedure: CYSTOSCOPY WITH RETROGRADE PYELOGRAM/URETERAL STENT REMOVAL;  [name], MD;  [name];  Service: Urology;  Laterality: Bilateral;  ? CYSTOSCOPY WITH BIOPSY N/A 09/21/2020  ? Procedure: CYSTOSCOPY WITH BLADDER BIOPSY;  [name], MD;  [name];  Service: Urology;  [name];  ? CYSTOSCOPY WITH FULGERATION N/A 09/21/2020  ? Procedure: CYSTOSCOPY WITH FULGERATION;  [name], MD;  [name];  Service: Urology;  [name];  ? CYSTOSCOPY WITH STENT PLACEMENT Bilateral 01/25/2018  ? Procedure: CYSTOSCOPY WITH STENT PLACEMENT;  [name], MD;  [name];  Service: Urology;  Laterality: Bilateral;  ? DORSAL SLIT N/A 01/06/2019  ? Procedure: DORSAL SLIT;  [name], MD;  [name];  Service: Urology;  [name];  ? ESOPHAGOGASTRODUODENOSCOPY (EGD) WITH PROPOFOL N/A 11/12/2019  ? Procedure: ESOPHAGOGASTRODUODENOSCOPY (EGD) WITH PROPOFOL;  Surgeon: Virgel Manifold, MD;  Location: [facility];  Service: Endoscopy;  [name];  ? EYE SURGERY    ? Cornea  transplants bilaterally & cataract surgery.  ? TRANSURETHRAL RESECTION OF BLADDER TUMOR N/A 01/25/2018  ? Procedure: TRANSURETHRAL RESECTION OF BLADDER TUMOR (TURBT);  [name], MD;  [name];  Service: Urology;  [name];  ? ? ?SOCIAL HISTORY: ?Social History  ? ?Socioeconomic History  ? Marital status: Widowed  ?  Spouse name: [name]  ? Number of children: 2  ? Years of education: 6th grade  ? Highest education level: 6th grade  ?Occupational History  ? Not on file  ?Tobacco Use  ? Smoking status: Former  ?  Types: Cigarettes  ?  Quit date: 2008-08-06  ?  Years since quitting: 13.3  ? Smokeless tobacco: Current  ?  Types: Chew  ? Tobacco comments:  ?  Stopped approximately 10 years ago.  ?Vaping Use  ? Vaping Use: Never used  ?Substance and Sexual Activity  ? Alcohol use: Yes  ?  Alcohol/week: 2.0 standard drinks  ?  Types: 2 Cans of beer per week  ?  Comment: occasionally  ? Drug use: Never  ? Sexual activity: Not Currently  ?  Birth control/protection: None  ?Other Topics Concern  ? Not on file  ?Social History Narrative  ?  lives in [place]; with oldest son. Wife died in 06-Aug-2018.  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 Pt still drives and is indepent with no serious chronic disease. Worked in [employer]   ? ?Social Determinants of Health  ? ?Financial Resource Strain: Low Risk   ? Difficulty of Paying Living Expenses:  Not very hard  ?Food Insecurity: No Food Insecurity  ? Worried About Charity fundraiser in the Last Year: Never true  ? Ran Out of Food in the Last Year: Never true  ?Transportation Needs: No Transportation Needs  ? Lack of Transportation (Medical): No  ? Lack of Transportation (Non-Medical): No  ?Physical Activity: Insufficiently Active  ? Days of Exercise per Week: 5 days  ? Minutes of Exercise per Session: 10 min  ?Stress: No Stress Concern Present  ? Feeling of Stress : Only a little  ?Social Connections:  Unknown  ? Frequency of Communication with Friends and Family: More than three times a week  ? Frequency of Social Gatherings with Friends and Family: Three times a week  ? Attends Religious Services: Not on fil

## 2021-10-20 NOTE — Assessment & Plan Note (Addendum)
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  Stage IV- FEB 2023- . No evidence for metastatic disease in the chest, abdomen, or Pelvis.  ? ?#History of local recurrent bladder ca < 1cm agree with TURBT [march 2022] [Dr.Stoiof-cystoscopy NOV 2022]-  Awaiting appt on 5/15 with urology. ? ?# Pulmonary: SEP 2022- CT-emphysema /bronchial thickening /bilateral basilar groundglass opacities -?  Resolving  BOOP/ fibrosis Vs. fungal infection [s/p itraconazole]. CT FEB 2023-STABLE>   ?? ?# Iatrogenic hypothyroidism-; TSH FEB 2023- TSH 10-currently on on Synthroid; 100 mcg wll repeat today-awaiting labs today. ? ?# Anemia sec to CKD-III-IV/ on Iv iron.Hb-11-12-Clinically- STABLE; Hold Venofer today. ? ?# CKD stageIII-IVGFR- 24 -Clinically  STABLE ; recommend increase fluid intake. ? ?#Weight loss improving; continue prednisone for appetite/also above pneumonitis- '5mg'$  /day.  ? ?# IV access: NO port ? ?mychart-Thyroid ? ?# DISPOSITION:  ?# HOLD venofer  ?# Follow-up in 3 months-;MD; cbc/cmp; thyoid panel ;possible venofer; CT CAP prior- Dr.B ? ?# I reviewed the blood work- with the patient in detail; also reviewed the imaging independently [as summarized above]; and with the patient in detail.  ? ? ? ? ?? ?

## 2021-10-22 LAB — THYROID PANEL WITH TSH
Free Thyroxine Index: 2.4 (ref 1.2–4.9)
T3 Uptake Ratio: 31 % (ref 24–39)
T4, Total: 7.9 ug/dL (ref 4.5–12.0)
TSH: 0.094 u[IU]/mL — ABNORMAL LOW (ref 0.450–4.500)

## 2021-10-24 ENCOUNTER — Encounter: Payer: Self-pay | Admitting: Urology

## 2021-10-24 ENCOUNTER — Ambulatory Visit: Payer: Medicare HMO | Admitting: Urology

## 2021-10-24 VITALS — BP 203/67 | HR 67 | Ht 68.0 in | Wt 136.0 lb

## 2021-10-24 DIAGNOSIS — Z8589 Personal history of malignant neoplasm of other organs and systems: Secondary | ICD-10-CM | POA: Diagnosis not present

## 2021-10-24 DIAGNOSIS — D494 Neoplasm of unspecified behavior of bladder: Secondary | ICD-10-CM | POA: Diagnosis not present

## 2021-10-24 DIAGNOSIS — Z8551 Personal history of malignant neoplasm of bladder: Secondary | ICD-10-CM | POA: Diagnosis not present

## 2021-10-24 DIAGNOSIS — C7910 Secondary malignant neoplasm of unspecified urinary organs: Secondary | ICD-10-CM | POA: Diagnosis not present

## 2021-10-24 LAB — MICROSCOPIC EXAMINATION: Bacteria, UA: NONE SEEN

## 2021-10-24 LAB — URINALYSIS, COMPLETE
Bilirubin, UA: NEGATIVE
Glucose, UA: NEGATIVE
Ketones, UA: NEGATIVE
Leukocytes,UA: NEGATIVE
Nitrite, UA: NEGATIVE
Protein,UA: NEGATIVE
RBC, UA: NEGATIVE
Specific Gravity, UA: <1.005 — ABNORMAL LOW (ref 1.005–1.030)
Urobilinogen, Ur: 0.2 mg/dL (ref 0.2–1.0)
pH, UA: 6 (ref 5.0–7.5)

## 2021-10-24 NOTE — Progress Notes (Signed)
? ?  10/24/21 ? ?CC:  ?Chief Complaint  ?Patient presents with  ? Cysto  ? ? ?Indications: ?History of urothelial carcinoma the bladder status post TURBT 01/25/2018 for a 4 cm bladder mass and bilateral hydronephrosis.  He was noted to have moderate right hydronephrosis and left hydronephrosis and had bilateral stents placed.  He was subsequently found to have metastatic disease to the retroperitoneal lymph nodes and bulky retroperitoneal adenopathy as well as mediastinal uptake and uptake in the right pubic rami.  Treated with palliative therapy and oncology and did not follow-up with urology until 12/2018.  Cystoscopy with bilateral retrograde pyelograms showed no persistent hydronephrosis and stents were removed.  No evidence of urothelial recurrence ? ?Underwent cystoscopy with bladder biopsies 09/2020 for mucosal erythema with pathology returning reactive lymphoid aggregates with inflammatory changes. ? ?HPI: Edward Cummings has no complaints today.  He denies gross hematuria. ? ?Blood pressure (!) 157/80, pulse (!) 105, height '5\' 8"'$  (1.727 m), weight 131 lb (59.4 kg). ?NED. A&Ox3.   ?No respiratory distress   ?Abd soft, NT, ND ?Normal phallus with bilateral descended testicles ? ?Cystoscopy Procedure Note ? ?Patient identification was confirmed, informed consent was obtained, and patient was prepped using Betadine solution.  Lidocaine jelly was administered per urethral meatus.   ? ? ?Pre-Procedure: ?- Inspection reveals a normal caliber urethral meatus. ? ?Procedure: ?The flexible cystoscope was introduced without difficulty ?- No urethral strictures/lesions are present. ?-  Moderate lateral lobe enlargement  prostate  ?-  Moderate elevation  bladder neck ?- Bilateral ureteral orifices identified; right resected ?- Bladder mucosa  reveals no ulcers, tumors, or lesions ?- No bladder stones ?- No trabeculation ? ?Retroflexion shows no abnormalities ? ? ?Post-Procedure: ?- Patient tolerated the procedure  well ? ?Assessment/ Plan: ?No mucosal abnormalities noted ?Surveillance cystoscopy 6 months ? ? ?Abbie Sons, MD ? ?

## 2021-10-25 ENCOUNTER — Ambulatory Visit (INDEPENDENT_AMBULATORY_CARE_PROVIDER_SITE_OTHER): Payer: Medicare HMO | Admitting: Internal Medicine

## 2021-10-25 ENCOUNTER — Encounter: Payer: Self-pay | Admitting: Internal Medicine

## 2021-10-25 VITALS — BP 150/80 | HR 63 | Ht 68.0 in | Wt 133.4 lb

## 2021-10-25 DIAGNOSIS — H18519 Endothelial corneal dystrophy, unspecified eye: Secondary | ICD-10-CM | POA: Diagnosis not present

## 2021-10-25 DIAGNOSIS — N1831 Chronic kidney disease, stage 3a: Secondary | ICD-10-CM | POA: Diagnosis not present

## 2021-10-25 DIAGNOSIS — J301 Allergic rhinitis due to pollen: Secondary | ICD-10-CM | POA: Diagnosis not present

## 2021-10-25 DIAGNOSIS — I10 Essential (primary) hypertension: Secondary | ICD-10-CM | POA: Diagnosis not present

## 2021-10-25 DIAGNOSIS — R69 Illness, unspecified: Secondary | ICD-10-CM | POA: Diagnosis not present

## 2021-10-25 DIAGNOSIS — D631 Anemia in chronic kidney disease: Secondary | ICD-10-CM

## 2021-10-25 DIAGNOSIS — F419 Anxiety disorder, unspecified: Secondary | ICD-10-CM

## 2021-10-25 MED ORDER — AMLODIPINE BESYLATE 5 MG PO TABS: 5.0000 mg | ORAL_TABLET | Freq: Every day | ORAL | 2 refills | Status: DC

## 2021-10-25 NOTE — Assessment & Plan Note (Signed)
Take Claritin 5 mg p.o. daily 

## 2021-10-25 NOTE — Assessment & Plan Note (Signed)
Stable at the present time. 

## 2021-10-25 NOTE — Assessment & Plan Note (Signed)
Anxiety stable ?

## 2021-10-25 NOTE — Assessment & Plan Note (Signed)
We will watch for anemia and chronic renal failure, patient was told to see a nephrologist and hematologist ?

## 2021-10-25 NOTE — Assessment & Plan Note (Signed)
Patient was started on Norvasc ?

## 2021-10-25 NOTE — Progress Notes (Signed)
? ?Established Patient Office Visit ? ?Subjective:  ?Patient ID: Edward Cummings, male    DOB: 1932/07/07  Age: 88 y.o. MRN: [id number] ? ?CC:  ?Chief Complaint  ?Patient presents with  ? Hypertension  ? ? ?Hypertension ? ? ?[name] presents for general check ?His blood pressure was found to be elevated today he has seen the urologist yesterday for history of bladder mass and right hydronephrosis and left hydronephrosis. ?He also checked on the bilateral stents ?Was treated by oncology cystoscopy was done by urologist in his office. ?Past Medical History:  ?Diagnosis Date  ? Anemia   ? Anxiety 12/31/2019  ? Cancer [facility])   ? bladder  ? Chronic kidney disease   ? Depression   ? Hypertension   ? Neuromuscular disorder ([place])   ? Nerve damage to left face/eye since around 2002.  ? ? ?Past Surgical History:  ?Procedure Laterality Date  ? CYSTOSCOPY W/ RETROGRADES Bilateral 01/25/2018  ? Procedure: CYSTOSCOPY WITH RETROGRADE PYELOGRAM;  [name], MD;  [name];  Service: Urology;  Laterality: Bilateral;  ? CYSTOSCOPY W/ URETERAL STENT PLACEMENT Bilateral 01/06/2019  ? Procedure: CYSTOSCOPY WITH RETROGRADE PYELOGRAM/URETERAL STENT REMOVAL;  [name], MD;  [name];  Service: Urology;  Laterality: Bilateral;  ? CYSTOSCOPY WITH BIOPSY N/A 09/21/2020  ? Procedure: CYSTOSCOPY WITH BLADDER BIOPSY;  [name], MD;  [name];  Service: Urology;  [name];  ? CYSTOSCOPY WITH FULGERATION N/A 09/21/2020  ? Procedure: CYSTOSCOPY WITH FULGERATION;  [name], MD;  [name];  Service: Urology;  [name];  ? CYSTOSCOPY WITH STENT PLACEMENT Bilateral 01/25/2018  ? Procedure: CYSTOSCOPY WITH STENT PLACEMENT;  [name], MD;  [name];  Service: Urology;  Laterality: Bilateral;  ? DORSAL SLIT N/A 01/06/2019  ? Procedure: DORSAL SLIT;  [name], MD;  [name];  Service:  Urology;  [name];  ? ESOPHAGOGASTRODUODENOSCOPY (EGD) WITH PROPOFOL N/A 11/12/2019  ? Procedure: ESOPHAGOGASTRODUODENOSCOPY (EGD) WITH PROPOFOL;  Surgeon: Virgel Manifold, MD;  Location: [facility];  Service: Endoscopy;  [name];  ? EYE SURGERY    ? Cornea transplants bilaterally & cataract surgery.  ? TRANSURETHRAL RESECTION OF BLADDER TUMOR N/A 01/25/2018  ? Procedure: TRANSURETHRAL RESECTION OF BLADDER TUMOR (TURBT);  [name], MD;  [name];  Service: Urology;  [name];  ? ? ?Family History  ?Problem Relation Age of Onset  ? Prostate cancer Neg Hx   ? Kidney cancer Neg Hx   ? Bladder Cancer Neg Hx   ? ? ?Social History  ? ?Socioeconomic History  ? Marital status: Widowed  ?  Spouse name: [name]  ? Number of children: 2  ? Years of education: 6th grade  ? Highest education level: 6th grade  ?Occupational History  ? Not on file  ?Tobacco Use  ? Smoking status: Former  ?  Types: Cigarettes  ?  Quit date: 2010  ?  Years since quitting: 13.3  ? Smokeless tobacco: Current  ?  Types: Chew  ? Tobacco comments:  ?  Stopped approximately 10 years ago.  ?Vaping Use  ? Vaping Use: Never used  ?Substance and Sexual Activity  ? Alcohol use: Yes  ?  Alcohol/week: 2.0 standard drinks  ?  Types: 2 Cans of beer per week  ?  Comment: occasionally  ? Drug use: Never  ? Sexual activity: Not Currently  ?  Birth control/protection: None  ?Other Topics Concern  ? Not on file  ?Social History Narrative  ?  lives in [place]; with oldest son. Wife died in 2018/09/02.  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 Pt still drives and is indepent with no serious chronic disease. Worked in [employer]   ? ?Social Determinants of Health  ? ?Financial Resource Strain: Low Risk   ? Difficulty of Paying Living Expenses: Not very hard  ?Food Insecurity: No Food Insecurity  ? Worried About Charity fundraiser in the Last Year: Never true  ?  Ran Out of Food in the Last Year: Never true  ?Transportation Needs: No Transportation Needs  ? Lack of Transportation (Medical): No  ? Lack of Transportation (Non-Medical): No  ?Physical Activity: Insufficiently Active  ? Days of Exercise per Week: 5 days  ? Minutes of Exercise per Session: 10 min  ?Stress: No Stress Concern Present  ? Feeling of Stress : Only a little  ?Social Connections: Unknown  ? Frequency of Communication with Friends and Family: More than three times a week  ? Frequency of Social Gatherings with Friends and Family: Three times a week  ? Attends Religious Services: Not on file  ? Active Member of Clubs or Organizations: Not on file  ? Attends Archivist Meetings: Not on file  ? Marital Status: Widowed  ?Intimate Partner Violence: Not At Risk  ? Fear of Current or Ex-Partner: No  ? Emotionally Abused: No  ? Physically Abused: No  ? Sexually Abused: No  ? ? ? ?Current Outpatient Medications:  ?  aspirin EC 81 MG tablet, Take 81 mg by mouth daily., Disp: , Rfl:  ?  citalopram (CELEXA) 10 MG tablet, Take 1 tablet (10 mg total) by mouth daily., Disp: 30 tablet, Rfl: 3 ?  levothyroxine (SYNTHROID) 100 MCG tablet, TAKE 1 TAB BY MOUTH ONCE DAILY. TAKE ON AN EMPTY STOMACH WITH A GLASS OF WATER ATLEAST 30-60 MINUTES BEFORE BREAKFAST, Disp: 30 tablet, Rfl: 1 ?  loratadine (CLARITIN) 10 MG tablet, Take 1 tablet (10 mg total) by mouth daily., Disp: 30 tablet, Rfl: 11 ?  mometasone (NASONEX) 50 MCG/ACT nasal spray, Place 2 sprays into the nose daily., Disp: 1 each, Rfl: 12 ?  Multiple Vitamin (MULTIVITAMIN WITH MINERALS) TABS tablet, Take 1 tablet by mouth daily. , Disp: , Rfl:  ?  prednisoLONE acetate (PRED FORTE) 1 % ophthalmic suspension, SMARTSIG:In Eye(s), Disp: , Rfl:  ?  predniSONE (DELTASONE) 5 MG tablet, TAKE 1 TABLET BY MOUTH ONCE A DAY WITH BREAKFAST., Disp: 60 tablet, Rfl: 0  ? ?No Known Allergies ? ?ROS ?Review of Systems  ?Constitutional: Negative.   ?HENT: Negative.    ?Eyes:  Negative.   ?Respiratory: Negative.    ?Cardiovascular: Negative.   ?Gastrointestinal: Negative.   ?Endocrine: Negative.   ?Genitourinary: Negative.   ?Musculoskeletal: Negative.   ?Skin: Negative.   ?Allergic/Immunologic: Negative.   ?Neurological: Negative.   ?Hematological: Negative.   ?Psychiatric/Behavioral: Negative.    ?All other systems reviewed and are negative. ? ?  ?Objective:  ?  ?Physical Exam ?Vitals reviewed.  ?Constitutional:   ?   Appearance: Normal appearance.  ?HENT:  ?   Mouth/Throat:  ?   Mouth: Mucous membranes are moist.  ?Eyes:  ?   Pupils: Pupils are equal, round, and reactive to light.  ?Neck:  ?   Vascular: No carotid bruit.  ?Cardiovascular:  ?   Rate and  Rhythm: Normal rate and regular rhythm.  ?   Pulses: Normal pulses.  ?   Heart sounds: Normal heart sounds.  ?Pulmonary:  ?   Effort: Pulmonary effort is normal.  ?   Breath sounds: Normal breath sounds.  ?Abdominal:  ?   General: Bowel sounds are normal.  ?   Palpations: Abdomen is soft. There is no hepatomegaly, splenomegaly or mass.  ?   Tenderness: There is no abdominal tenderness.  ?   Hernia: No hernia is present.  ?Musculoskeletal:  ?   Cervical back: Neck supple.  ?   Right lower leg: No edema.  ?   Left lower leg: No edema.  ?Skin: ?   Findings: No rash.  ?Neurological:  ?   Mental Status: He is alert and oriented to person, place, and time.  ?   Motor: No weakness.  ?Psychiatric:     ?   Mood and Affect: Mood normal.     ?   Behavior: Behavior normal.  ? ? ?BP (!) 150/80   Pulse 63   Ht '5\' 8"'$  (1.727 m)   Wt 133 lb 6.4 oz (60.5 kg)   BMI 20.28 kg/m?  ?Wt Readings from Last 3 Encounters:  ?10/25/21 133 lb 6.4 oz (60.5 kg)  ?10/24/21 136 lb (61.7 kg)  ?10/20/21 136 lb 3.2 oz (61.8 kg)  ? ? ? ?Health Maintenance Due  ?Topic Date Due  ? COVID-19 Vaccine (4 - Booster for Pfizer series) 05/04/2020  ? ? ?There are no preventive care reminders to display for this patient. ? ?Lab Results  ?Component Value Date  ? TSH 0.094 (L)  10/20/2021  ? ?Lab Results  ?Component Value Date  ? WBC 9.2 10/20/2021  ? HGB 10.9 (L) 10/20/2021  ? HCT 33.3 (L) 10/20/2021  ? MCV 98.8 10/20/2021  ? PLT 219 10/20/2021  ? ?Lab Results  ?Component Value Date  ? NA 13

## 2021-10-28 ENCOUNTER — Other Ambulatory Visit: Payer: Self-pay | Admitting: *Deleted

## 2021-10-28 ENCOUNTER — Telehealth: Payer: Self-pay | Admitting: Internal Medicine

## 2021-10-28 MED ORDER — PREDNISOLONE ACETATE 1 % OP SUSP: 1.0000 [drp] | Freq: Every day | OPHTHALMIC | 4 refills | Status: DC

## 2021-10-28 MED ORDER — LEVOTHYROXINE SODIUM 75 MCG PO TABS: 75.0000 ug | ORAL_TABLET | Freq: Every day | ORAL | 3 refills | Status: DC

## 2021-10-28 NOTE — Telephone Encounter (Signed)
H/A-please inform the son Ronalee Belts that patient's thyroid levels are slightly abnormal.  Recommend decreasing the dose of thyroid medication-new prescription sent.  We will again recheck at next visit.  Thanks

## 2021-10-28 NOTE — Telephone Encounter (Signed)
Patient's son notified and also sent in [place] message at his request.

## 2021-11-11 ENCOUNTER — Ambulatory Visit (INDEPENDENT_AMBULATORY_CARE_PROVIDER_SITE_OTHER): Payer: Medicare HMO | Admitting: Nurse Practitioner

## 2021-11-11 ENCOUNTER — Encounter: Payer: Self-pay | Admitting: Nurse Practitioner

## 2021-11-11 VITALS — BP 140/70 | HR 57 | Ht 68.0 in | Wt 133.5 lb

## 2021-11-11 DIAGNOSIS — I10 Essential (primary) hypertension: Secondary | ICD-10-CM | POA: Diagnosis not present

## 2021-11-11 NOTE — Assessment & Plan Note (Addendum)
Patient blood pressure 140/70 in the office today. Denies any headache dizziness or chest pain. Continue amlodipine 5 mg daily. Advised patient to check the blood pressure daily at home. Make a log and bring to the next appointment.

## 2021-11-11 NOTE — Progress Notes (Signed)
Established Patient Office Visit  Subjective:  Patient ID: Edward Cummings, male    DOB: Sep 26, 1932  Age: 88 y.o. MRN: [id number]  CC:  Chief Complaint  Patient presents with   Hypertension    Patient here today for 2 week follow up on blood pressure      HPI  Edward Cummings presents for blood pressure follow up. He checks BP at home the sytolic number ranges from 140 - 150. Patient does not remember the diastolic number.  He denies headache, SOB, dizziness at present.   HPI   Past Medical History:  Diagnosis Date   Anemia    Anxiety 12/31/2019   Cancer [facility])    bladder   Chronic kidney disease    Depression    Hypertension    Neuromuscular disorder ([place])    Nerve damage to left face/eye since around 2002.    Past Surgical History:  Procedure Laterality Date   CYSTOSCOPY W/ RETROGRADES Bilateral 01/25/2018   Procedure: CYSTOSCOPY WITH RETROGRADE PYELOGRAM;  [name], MD;  [name];  Service: Urology;  Laterality: Bilateral;   CYSTOSCOPY W/ URETERAL STENT PLACEMENT Bilateral 01/06/2019   Procedure: CYSTOSCOPY WITH RETROGRADE PYELOGRAM/URETERAL STENT REMOVAL;  [name], MD;  [name];  Service: Urology;  Laterality: Bilateral;   CYSTOSCOPY WITH BIOPSY N/A 09/21/2020   Procedure: CYSTOSCOPY WITH BLADDER BIOPSY;  [name], MD;  [name];  Service: Urology;  [name];   CYSTOSCOPY WITH FULGERATION N/A 09/21/2020   Procedure: CYSTOSCOPY WITH FULGERATION;  [name], MD;  [name];  Service: Urology;  [name];   CYSTOSCOPY WITH STENT PLACEMENT Bilateral 01/25/2018   Procedure: CYSTOSCOPY WITH STENT PLACEMENT;  [name], MD;  [name];  Service: Urology;  Laterality: Bilateral;   DORSAL SLIT N/A 01/06/2019   Procedure: DORSAL SLIT;  [name], MD;  [name];  Service: Urology;  [name];    ESOPHAGOGASTRODUODENOSCOPY (EGD) WITH PROPOFOL N/A 11/12/2019   Procedure: ESOPHAGOGASTRODUODENOSCOPY (EGD) WITH PROPOFOL;  Surgeon: Virgel Manifold, MD;  Location: [facility];  Service: Endoscopy;  [name];   EYE SURGERY     Cornea transplants bilaterally & cataract surgery.   TRANSURETHRAL RESECTION OF BLADDER TUMOR N/A 01/25/2018   Procedure: TRANSURETHRAL RESECTION OF BLADDER TUMOR (TURBT);  [name], MD;  [name];  Service: Urology;  [name];    Family History  Problem Relation Age of Onset   Prostate cancer Neg Hx    Kidney cancer Neg Hx    Bladder Cancer Neg Hx     Social History   Socioeconomic History   Marital status: Widowed    Spouse name: Edward Cummings   Number of children: 2   Years of education: 6th grade   Highest education level: 6th grade  Occupational History   Not on file  Tobacco Use   Smoking status: Former    Types: Cigarettes    Quit date: 2010    Years since quitting: 13.4   Smokeless tobacco: Current    Types: Chew   Tobacco comments:    Stopped approximately 10 years ago.  Vaping Use   Vaping Use: Never used  Substance and Sexual Activity   Alcohol use: Yes    Alcohol/week: 2.0 standard drinks    Types: 2 Cans of beer per week    Comment: occasionally   Drug use: Never   Sexual  activity: Not Currently    Birth control/protection: None  Other Topics Concern   Not on file  Social History Narrative    lives in [place]; with oldest son. Wife died in August 28, 2018.  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 Pt still drives and is indepent with no serious chronic disease. Worked in [employer] Strain: Low Risk    Difficulty of Paying Living Expenses: Not very hard  Food Insecurity: No Food Insecurity   Worried About Charity fundraiser in the Last Year: Never true   Arboriculturist in the Last  Year: Never true  Transportation Needs: No Transportation Needs   Lack of Transportation (Medical): No   Lack of Transportation (Non-Medical): No  Physical Activity: Insufficiently Active   Days of Exercise per Week: 5 days   Minutes of Exercise per Session: 10 min  Stress: No Stress Concern Present   Feeling of Stress : Only a little  Social Connections: Unknown   Frequency of Communication with Friends and Family: More than three times a week   Frequency of Social Gatherings with Friends and Family: Three times a week   Attends Religious Services: Not on file   Active Member of Clubs or Organizations: Not on file   Attends Club or Organization Meetings: Not on file   Marital Status: Widowed  Human resources officer Violence: Not At Risk   Fear of Current or Ex-Partner: No   Emotionally Abused: No   Physically Abused: No   Sexually Abused: No     Outpatient Medications Prior to Visit  Medication Sig Dispense Refill   amLODipine (NORVASC) 5 MG tablet Take 1 tablet (5 mg total) by mouth daily. 90 tablet 2   aspirin EC 81 MG tablet Take 81 mg by mouth daily.     citalopram (CELEXA) 10 MG tablet Take 1 tablet (10 mg total) by mouth daily. 30 tablet 3   levothyroxine (SYNTHROID) 75 MCG tablet Take 1 tablet (75 mcg total) by mouth daily before breakfast. 30 tablet 3   loratadine (CLARITIN) 10 MG tablet Take 1 tablet (10 mg total) by mouth daily. 30 tablet 11   mometasone (NASONEX) 50 MCG/ACT nasal spray Place 2 sprays into the nose daily. 1 each 12   Multiple Vitamin (MULTIVITAMIN WITH MINERALS) TABS tablet Take 1 tablet by mouth daily.      prednisoLONE acetate (PRED FORTE) 1 % ophthalmic suspension Place 1 drop into both eyes daily at 2 PM. 5 mL 4   predniSONE (DELTASONE) 5 MG tablet TAKE 1 TABLET BY MOUTH ONCE A DAY WITH BREAKFAST. 60 tablet 0   No facility-administered medications prior to visit.    No Known Allergies  ROS Review of Systems  Constitutional:  Negative for activity  change, chills and fever.  HENT:  Negative for congestion, ear pain, postnasal drip and sore throat.   Eyes:  Negative for discharge and redness.  Respiratory:  Negative for apnea and shortness of breath.   Cardiovascular:  Negative for chest pain and palpitations.  Gastrointestinal:  Negative for abdominal distention, blood in stool and nausea.  Genitourinary:  Negative for difficulty urinating, flank pain and hematuria.  Musculoskeletal:  Positive for back pain and joint swelling. Negative for arthralgias and neck pain.  Skin:  Negative for color change and rash.  Neurological:  Negative for dizziness, numbness and headaches.  Psychiatric/Behavioral:  Negative for agitation,  behavioral problems and confusion.      Objective:    Physical Exam Constitutional:      Appearance: Normal appearance. He is normal weight.  HENT:     Head: Normocephalic and atraumatic.     Right Ear: Tympanic membrane normal.     Left Ear: Tympanic membrane normal.     Nose: Nose normal.     Mouth/Throat:     Mouth: Mucous membranes are moist.     Dentition: No gum lesions (edentulous).     Tonsils: No tonsillar exudate.  Eyes:     Extraocular Movements: Extraocular movements intact.     Conjunctiva/sclera: Conjunctivae normal.     Pupils: Pupils are equal, round, and reactive to light.     Comments: Dropping on left eyelid  Cardiovascular:     Rate and Rhythm: Normal rate and regular rhythm.     Pulses: Normal pulses.     Heart sounds: Normal heart sounds.  Pulmonary:     Effort: Pulmonary effort is normal.     Breath sounds: Normal breath sounds.  Abdominal:     General: Abdomen is flat. Bowel sounds are normal.     Palpations: Abdomen is soft.  Musculoskeletal:        General: Normal range of motion.     Cervical back: Normal range of motion.  Skin:    General: Skin is warm.     Capillary Refill: Capillary refill takes less than 2 seconds.  Neurological:     General: No focal deficit  present.     Mental Status: He is alert and oriented to person, place, and time. Mental status is at baseline.  Psychiatric:        Mood and Affect: Mood normal.        Behavior: Behavior normal.        Thought Content: Thought content normal.        Judgment: Judgment normal.    BP 140/70   Pulse (!) 57   Ht '5\' 8"'$  (1.727 m)   Wt 133 lb 8 oz (60.6 kg)   BMI 20.30 kg/m  Wt Readings from Last 3 Encounters:  11/11/21 133 lb 8 oz (60.6 kg)  10/25/21 133 lb 6.4 oz (60.5 kg)  10/24/21 136 lb (61.7 kg)     Health Maintenance Due  Topic Date Due   COVID-19 Vaccine (4 - Booster for Pfizer series) 05/04/2020    There are no preventive care reminders to display for this patient.  Lab Results  Component Value Date   TSH 0.094 (L) 10/20/2021   Lab Results  Component Value Date   WBC 9.2 10/20/2021   HGB 10.9 (L) 10/20/2021   HCT 33.3 (L) 10/20/2021   MCV 98.8 10/20/2021   PLT 219 10/20/2021   Lab Results  Component Value Date   NA 135 10/20/2021   K 4.4 10/20/2021   CO2 23 10/20/2021   GLUCOSE 95 10/20/2021   BUN 32 (H) 10/20/2021   CREATININE 2.26 (H) 10/20/2021   BILITOT 0.6 10/20/2021   ALKPHOS 60 10/20/2021   AST 17 10/20/2021   ALT 16 10/20/2021   PROT 6.6 10/20/2021   ALBUMIN 3.7 10/20/2021   CALCIUM 8.9 10/20/2021   ANIONGAP 4 (L) 10/20/2021   No results found for: CHOL No results found for: HDL No results found for: LDLCALC No results found for: TRIG No results found for: [facility] Lab Results  Component Value Date   HGBA1C 4.5 (L) 04/13/2018  Assessment & Plan:   Problem List Items Addressed This Visit       Cardiovascular and Mediastinum   Hypertension - Primary    Patient blood pressure 140/70 in the office today. Denies any headache dizziness or chest pain. Continue amlodipine 5 mg daily. Advised patient to check the blood pressure daily at home. Make a log and bring to the next appointment.         No orders of the defined types  were placed in this encounter.    Follow-up: Return in about 1 month (around 12/11/2021) for BP check.    Theresia Lo, NP

## 2021-11-15 ENCOUNTER — Other Ambulatory Visit: Payer: Medicare HMO | Admitting: *Deleted

## 2021-11-15 NOTE — Patient Outreach (Signed)
[facility]) Care Management  11/15/2021  Edward Cummings 07/04/1932 [id number]  Unsuccessful outreach attempt made to patient. Patient answered the phone and stated that he would not be able to speak today. Patient did request that this nurse call back at a later date.   Plan: RN Health Coach will call patient within the month of June.  Emelia Loron RN, BSN [place] [phone] [name]

## 2021-11-22 ENCOUNTER — Ambulatory Visit: Payer: Medicare HMO | Admitting: *Deleted

## 2021-12-01 ENCOUNTER — Other Ambulatory Visit: Payer: Self-pay | Admitting: *Deleted

## 2021-12-01 NOTE — Patient Outreach (Signed)
[facility]) Care Management  12/01/2021  [name] Oct 01, 1937 [id number]  [facility]) Care Management RN Health Coach Note   12/01/2021 Name:  Edward Cummings MRN:  [id number] DOB:  [birth year]  Summary: Patient reports his B/P has been running higher. He states his systolic range has been 741-423'T and diastolic range is 53-20'E. Patient is taking his B/P daily and recording the value. He plans to bring these values to his upcomming visit with NP Toy Care on 7/7//23 as instructed. Patient states he is limiting the amount of sodium in his diet. He continues to drink nutritional supplements and reports staying hydrated. Patient did not have any further questions or concerns today and did confirm that he has this nurse's contact number to call her if needed.   Recommendations/Changes made from today's visit: Limit the amount of sodium in your diet Drink 1-2 Ensure supplements a day Drink 2-3 bottles of water a day Eat high calorie and high protein foods frequently throughout the day Weigh 3 times a week to help maintain weight at 135 pounds or greater Make an eye examination appointment for your eye health Bring your blood pressure readings to your upcoming appointment 12/16/21 with NP Toy Care  Subjective: Edward Cummings is an 88 y.o. year old male who is a primary patient of Edward Athens, MD. The care management team was consulted for assistance with care management and/or care coordination needs.    RN Health Coach completed Telephone Visit today.   Objective:  Medications Reviewed Today     Reviewed by Michiel Cowboy, RN (Registered Nurse) on 12/01/21 at 1425  Med List Status: <None>   Medication Order Taking? Sig Documenting Provider Last Dose Status Informant  amLODipine (NORVASC) 5 MG tablet [id number] Yes Take 1 tablet (5 mg total) by mouth daily. Edward Athens, MD Taking Active   aspirin EC 81 MG tablet [id number] Yes Take 81 mg by mouth daily.  [provider] Taking Active Family Member           Med Note Edward Cummings   Fri Jan 10, 2019  9:07 AM)    citalopram (CELEXA) 10 MG tablet [id number] Yes Take 1 tablet (10 mg total) by mouth daily. Edward Athens, MD Taking Active   levothyroxine (SYNTHROID) 75 MCG tablet [id number] Yes Take 1 tablet (75 mcg total) by mouth daily before breakfast. Edward Sickle, MD Taking Active   loratadine (CLARITIN) 10 MG tablet [id number] Yes Take 1 tablet (10 mg total) by mouth daily. Edward Athens, MD Taking Active   mometasone (NASONEX) 50 MCG/ACT nasal spray [id number] Yes Place 2 sprays into the nose daily. Edward Athens, MD Taking Active   Multiple Vitamin (MULTIVITAMIN WITH MINERALS) TABS tablet [id number] Yes Take 1 tablet by mouth daily.  [provider] Taking Active Family Member  prednisoLONE acetate (PRED FORTE) 1 % ophthalmic suspension [id number] Yes Place 1 drop into both eyes daily at 2 PM. Edward Athens, MD Taking Active   predniSONE (DELTASONE) 5 MG tablet [id number] Yes TAKE 1 TABLET BY MOUTH ONCE A DAY WITH BREAKFAST. Edward Sickle, MD Taking Active              SDOH:  (Social Determinants of Health) assessments and interventions performed: SDOH assessments completed today and documented in the Epic system.   Care Plan  Review of patient past medical history, allergies, medications, health status, including review of consultants reports, laboratory and other test data, was performed as part of  comprehensive evaluation for care management services.   Care Plan : RN Care Manager Plan of Care  Updates made by Michiel Cowboy, RN since 12/01/2021 12:00 AM     Problem: Knowledge Deficit Related to Hypertension and  Increasing BMI   Priority: High     Long-Range Goal: Development of Plan of Care for Management of Hypertension and Increasing BMI   Start Date: 06/23/2021  Expected End Date: 07/11/2021  Priority: High  Note:   Current Barriers:   Knowledge Deficits related to plan of care for management of HTN  and increasing BMI  RNCM Clinical Goal(s):  Patient will demonstrate Improved adherence to prescribed treatment plan for HTN and maintaining weight at 135 or higher as evidenced by beginning to monitor B/P 1 time a week; beginning to drink 1-2 Ensures a day; weighing 3 times a week to ensure weight is at goal; taking medications as prescribed; contacting providers for any questions and concerns continue to work with [place] to address care management and care coordination needs related to  HTN and maintaining weight at 135 or higher as evidenced by adherence to CM Team Scheduled appointments through collaboration with RN Care manager, provider, and care team.   Interventions: Inter-disciplinary care team collaboration (see longitudinal plan of care) Evaluation of current treatment plan related to  self management and patient's adherence to plan as established by provider   Hypertension Interventions:  (Status:  New goal.) Long Term Goal Last practice recorded BP readings:  BP Readings from Last 3 Encounters:  04/25/21 (!) 157/80  04/20/21 (!) 144/65  03/21/21 127/69  Most recent eGFR/CrCl: No results found for: EGFR  No components found for: CRCL  Evaluation of current treatment plan related to hypertension self management and patient's adherence to plan as established by provider Discussed complications of poorly controlled blood pressure such as heart disease, stroke, circulatory complications, vision complications, kidney impairment, sexual dysfunction Discussed monitoring B/P 1 time a week, recording the values, and bringing the B/P recording to provider appointments Encouraged patient to continue to stay physically active by not sitting for long periods and 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 Discussed limiting salt in his diet  Weight Gain RNCM Interventions: - Discussed drinking 1-2 Ensures a day; Nurse will  send Ensure coupons - Encouraged patient to eat high calorie and high protein foods frequently throughout the day; nurse will send education - Discussed Drinking 2-3 bottles of water a day to stay hydrated - Discussed weighing 3 times a week to ensure his weight is being maintained at 135 or greater - Set a goal of 135 pounds or greater  Patient Goals/Self-Care Activities: Take all medications as prescribed Attend all scheduled provider appointments check blood pressure daily write blood pressure results in a log or diary learn about high blood pressure take blood pressure log to all doctor appointments keep all doctor appointments Continue to stay active by not sitting for long periods Limit the amount of sodium in your diet Drink 1-2 Ensure supplements a day Drink 2-3 bottles of water a day Eat high calorie and high protein foods frequently throughout the day Weigh 3 times a week to help maintain weight at 135 pounds or greater Make an eye examination appointment for your eye health Bring your blood pressure readings to your upcoming appointment 12/16/21 with NP Toy Care  Follow Up Plan:  Telephone follow up appointment with care management team member scheduled for:  September       Plan: Telephone  follow up appointment with care management team member scheduled for:  September.  Emelia Loron RN, [place] [phone] [email or web address]

## 2021-12-01 NOTE — Patient Instructions (Addendum)
Visit Information  Thank you for taking time to visit with me today. Please don't hesitate to contact me if I can be of assistance to you before our next scheduled telephone appointment.  Following are the goals we discussed today:  Take all medications as prescribed Attend all scheduled provider appointments check blood pressure daily write blood pressure results in a log or diary learn about high blood pressure take blood pressure log to all doctor appointments keep all doctor appointments Continue to stay active by not sitting for long periods Limit the amount of sodium in your diet Drink 1-2 Ensure supplements a day Drink 2-3 bottles of water a day Eat high calorie and high protein foods frequently throughout the day Weigh 3 times a week to help maintain weight at 135 pounds or greater Make an eye examination appointment for your eye health Bring your blood pressure readings to your upcoming appointment 12/16/21 with NP Toy Care  The patient verbalized understanding of instructions, educational materials, and care plan provided today and agreed to receive a mailed copy of patient instructions, educational materials, and care plan.   Telephone follow up appointment with care management team member scheduled for: September  Emelia Loron RN, [place] [phone] [email or web address]

## 2021-12-16 ENCOUNTER — Encounter: Payer: Self-pay | Admitting: Nurse Practitioner

## 2021-12-16 ENCOUNTER — Ambulatory Visit (INDEPENDENT_AMBULATORY_CARE_PROVIDER_SITE_OTHER): Payer: Medicare HMO | Admitting: Nurse Practitioner

## 2021-12-16 VITALS — BP 140/86 | HR 78 | Ht 68.0 in | Wt 126.2 lb

## 2021-12-16 DIAGNOSIS — I10 Essential (primary) hypertension: Secondary | ICD-10-CM

## 2021-12-16 DIAGNOSIS — N184 Chronic kidney disease, stage 4 (severe): Secondary | ICD-10-CM | POA: Diagnosis not present

## 2021-12-16 DIAGNOSIS — E039 Hypothyroidism, unspecified: Secondary | ICD-10-CM

## 2021-12-16 MED ORDER — AMLODIPINE BESYLATE 5 MG PO TABS: 5.0000 mg | ORAL_TABLET | Freq: Every day | ORAL | 2 refills | Status: DC

## 2021-12-16 NOTE — Progress Notes (Unsigned)
Established Patient Office Visit  Subjective:  Patient ID: Edward Cummings, male    DOB: [birth year]  Age: 88 y.o. MRN: [id number]  CC:  Chief Complaint  Patient presents with   Follow-up     HPI  Edward Cummings presents for routine blood pressure follow up. He checks BP at home the sytolic number ranges from 140 - 150. Patient does not remember the diastolic number.  He denies headache, SOB, dizziness at present.   HPI   Past Medical History:  Diagnosis Date   Anemia    Anxiety 12/31/2019   Cancer [facility])    bladder   Chronic kidney disease    Depression    Hypertension    Neuromuscular disorder ([place])    Nerve damage to left face/eye since around 2002.    Past Surgical History:  Procedure Laterality Date   CYSTOSCOPY W/ RETROGRADES Bilateral 01/25/2018   Procedure: CYSTOSCOPY WITH RETROGRADE PYELOGRAM;  [name], MD;  [name];  Service: Urology;  Laterality: Bilateral;   CYSTOSCOPY W/ URETERAL STENT PLACEMENT Bilateral 01/06/2019   Procedure: CYSTOSCOPY WITH RETROGRADE PYELOGRAM/URETERAL STENT REMOVAL;  [name], MD;  [name];  Service: Urology;  Laterality: Bilateral;   CYSTOSCOPY WITH BIOPSY N/A 09/21/2020   Procedure: CYSTOSCOPY WITH BLADDER BIOPSY;  [name], MD;  [name];  Service: Urology;  [name];   CYSTOSCOPY WITH FULGERATION N/A 09/21/2020   Procedure: CYSTOSCOPY WITH FULGERATION;  [name], MD;  [name];  Service: Urology;  [name];   CYSTOSCOPY WITH STENT PLACEMENT Bilateral 01/25/2018   Procedure: CYSTOSCOPY WITH STENT PLACEMENT;  [name], MD;  [name];  Service: Urology;  Laterality: Bilateral;   DORSAL SLIT N/A 01/06/2019   Procedure: DORSAL SLIT;  [name], MD;  [name];  Service: Urology;  [name];   ESOPHAGOGASTRODUODENOSCOPY (EGD) WITH PROPOFOL N/A 11/12/2019   Procedure:  ESOPHAGOGASTRODUODENOSCOPY (EGD) WITH PROPOFOL;  Surgeon: Virgel Manifold, MD;  Location: [facility];  Service: Endoscopy;  [name];   EYE SURGERY     Cornea transplants bilaterally & cataract surgery.   TRANSURETHRAL RESECTION OF BLADDER TUMOR N/A 01/25/2018   Procedure: TRANSURETHRAL RESECTION OF BLADDER TUMOR (TURBT);  [name], MD;  [name];  Service: Urology;  [name];    Family History  Problem Relation Age of Onset   Prostate cancer Neg Hx    Kidney cancer Neg Hx    Bladder Cancer Neg Hx     Social History   Socioeconomic History   Marital status: Widowed    Spouse name: [name]   Number of children: 2   Years of education: 6th grade   Highest education level: 6th grade  Occupational History   Not on file  Tobacco Use   Smoking status: Former    Types: Cigarettes    Quit date: 2010    Years since quitting: 13.5   Smokeless tobacco: Current    Types: Chew   Tobacco comments:    Stopped approximately 10 years ago.  Vaping Use   Vaping Use: Never used  Substance and Sexual Activity   Alcohol use: Yes    Alcohol/week: 2.0 standard drinks of alcohol    Types: 2 Cans of beer per week    Comment: occasionally   Drug use: Never   Sexual activity: Not Currently    Birth control/protection: None  Other Topics  Concern   Not on file  Social History Narrative    lives in [place]; with oldest son. Wife died in 08-29-2018.  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 Pt still drives and is indepent with no serious chronic disease. Worked in [place] Strain: Low Risk  (06/23/2021)   Overall Financial Resource Strain (CARDIA)    Difficulty of Paying Living Expenses: Not very hard  Food Insecurity: No Food Insecurity (12/01/2021)   Hunger Vital Sign    Worried About Running Out of Food in the Last Year: Never true     Ran Out of Food in the Last Year: Never true  Transportation Needs: No Transportation Needs (12/01/2021)   PRAPARE - Hydrologist (Medical): No    Lack of Transportation (Non-Medical): No  Physical Activity: Insufficiently Active (05/27/2021)   Exercise Vital Sign    Days of Exercise per Week: 5 days    Minutes of Exercise per Session: 10 min  Stress: No Stress Concern Present (05/27/2021)   [place]    Feeling of Stress : Only a little  Social Connections: Unknown (05/27/2021)   Social Connection and Isolation Panel [NHANES]    Frequency of Communication with Friends and Family: More than three times a week    Frequency of Social Gatherings with Friends and Family: Three times a week    Attends Religious Services: Not on file    Active Member of Clubs or Organizations: Not on file    Attends Club or Organization Meetings: Not on file    Marital Status: Widowed  Intimate Partner Violence: Not At Risk (05/27/2021)   Humiliation, Afraid, Rape, and Kick questionnaire    Fear of Current or Ex-Partner: No    Emotionally Abused: No    Physically Abused: No    Sexually Abused: No     Outpatient Medications Prior to Visit  Medication Sig Dispense Refill   amLODipine (NORVASC) 5 MG tablet Take 1 tablet (5 mg total) by mouth daily. 90 tablet 2   aspirin EC 81 MG tablet Take 81 mg by mouth daily.     citalopram (CELEXA) 10 MG tablet Take 1 tablet (10 mg total) by mouth daily. 30 tablet 3   levothyroxine (SYNTHROID) 75 MCG tablet Take 1 tablet (75 mcg total) by mouth daily before breakfast. 30 tablet 3   loratadine (CLARITIN) 10 MG tablet Take 1 tablet (10 mg total) by mouth daily. 30 tablet 11   mometasone (NASONEX) 50 MCG/ACT nasal spray Place 2 sprays into the nose daily. 1 each 12   Multiple Vitamin (MULTIVITAMIN WITH MINERALS) TABS tablet Take 1 tablet by mouth daily.      prednisoLONE  acetate (PRED FORTE) 1 % ophthalmic suspension Place 1 drop into both eyes daily at 2 PM. 5 mL 4   predniSONE (DELTASONE) 5 MG tablet TAKE 1 TABLET BY MOUTH ONCE A DAY WITH BREAKFAST. 60 tablet 0   No facility-administered medications prior to visit.    No Known Allergies  ROS Review of Systems  Constitutional:  Negative for activity change, chills and fever.  HENT:  Negative for congestion, ear pain, postnasal drip and sore throat.   Eyes:  Negative for discharge and redness.  Respiratory:  Negative for apnea and shortness of breath.   Cardiovascular:  Negative for chest pain and palpitations.  Gastrointestinal:  Negative for abdominal distention, blood in stool and nausea.  Genitourinary:  Negative for difficulty urinating, flank pain and hematuria.  Musculoskeletal:  Positive for back pain and joint swelling. Negative for arthralgias and neck pain.  Skin:  Negative for color change and rash.  Neurological:  Negative for dizziness, numbness and headaches.  Psychiatric/Behavioral:  Negative for agitation, behavioral problems and confusion.       Objective:    Physical Exam Constitutional:      Appearance: Normal appearance. He is normal weight.  HENT:     Head: Normocephalic and atraumatic.     Right Ear: Tympanic membrane normal.     Left Ear: Tympanic membrane normal.     Nose: Nose normal.     Mouth/Throat:     Mouth: Mucous membranes are moist.     Dentition: No gum lesions (edentulous).     Tonsils: No tonsillar exudate.  Eyes:     Extraocular Movements: Extraocular movements intact.     Conjunctiva/sclera: Conjunctivae normal.     Pupils: Pupils are equal, round, and reactive to light.     Comments: Dropping on left eyelid  Cardiovascular:     Rate and Rhythm: Normal rate and regular rhythm.     Pulses: Normal pulses.     Heart sounds: Normal heart sounds.  Pulmonary:     Effort: Pulmonary effort is normal.     Breath sounds: Normal breath sounds.  Abdominal:      General: Abdomen is flat. Bowel sounds are normal.     Palpations: Abdomen is soft.  Musculoskeletal:        General: Normal range of motion.     Cervical back: Normal range of motion.  Skin:    General: Skin is warm.     Capillary Refill: Capillary refill takes less than 2 seconds.  Neurological:     General: No focal deficit present.     Mental Status: He is alert and oriented to person, place, and time. Mental status is at baseline.  Psychiatric:        Mood and Affect: Mood normal.        Behavior: Behavior normal.        Thought Content: Thought content normal.        Judgment: Judgment normal.     BP 140/86   Pulse 78   Ht '5\' 8"'$  (1.727 m)   Wt 126 lb 3.2 oz (57.2 kg)   BMI 19.19 kg/m  Wt Readings from Last 3 Encounters:  12/16/21 126 lb 3.2 oz (57.2 kg)  11/11/21 133 lb 8 oz (60.6 kg)  10/25/21 133 lb 6.4 oz (60.5 kg)     Health Maintenance Due  Topic Date Due   COVID-19 Vaccine (4 - Booster for Pfizer series) 05/04/2020    There are no preventive care reminders to display for this patient.  Lab Results  Component Value Date   TSH 0.094 (L) 10/20/2021   Lab Results  Component Value Date   WBC 9.2 10/20/2021   HGB 10.9 (L) 10/20/2021   HCT 33.3 (L) 10/20/2021   MCV 98.8 10/20/2021   PLT 219 10/20/2021   Lab Results  Component Value Date   NA 135 10/20/2021   K 4.4 10/20/2021   CO2 23 10/20/2021   GLUCOSE 95 10/20/2021   BUN 32 (H) 10/20/2021   CREATININE 2.26 (H) 10/20/2021   BILITOT 0.6 10/20/2021   ALKPHOS 60 10/20/2021   AST 17 10/20/2021   ALT 16 10/20/2021  PROT 6.6 10/20/2021   ALBUMIN 3.7 10/20/2021   CALCIUM 8.9 10/20/2021   ANIONGAP 4 (L) 10/20/2021   No results found for: "CHOL" No results found for: "HDL" No results found for: "LDLCALC" No results found for: "TRIG" No results found for: "CHOLHDL" Lab Results  Component Value Date   HGBA1C 4.5 (L) 04/13/2018      Assessment & Plan:   Problem List Items Addressed  This Visit   None   No orders of the defined types were placed in this encounter.    Follow-up: No follow-ups on file.    Theresia Lo, NP

## 2021-12-17 ENCOUNTER — Other Ambulatory Visit: Payer: Self-pay | Admitting: Internal Medicine

## 2021-12-18 ENCOUNTER — Encounter: Payer: Self-pay | Admitting: Nurse Practitioner

## 2021-12-18 DIAGNOSIS — N184 Chronic kidney disease, stage 4 (severe): Secondary | ICD-10-CM | POA: Insufficient documentation

## 2021-12-18 DIAGNOSIS — E039 Hypothyroidism, unspecified: Secondary | ICD-10-CM | POA: Insufficient documentation

## 2021-12-18 NOTE — Assessment & Plan Note (Signed)
Patient was taking levothyroxine 75 and 88 mcg Advised patient to continue levothyroxine 75 mcg daily half an hour before breakfast. Levothyroxine 88 mcg was discarded from the patient medication bag.

## 2021-12-18 NOTE — Assessment & Plan Note (Signed)
Patient GFR 27, BUN 32 and creatinine 2.26 on 10/24/2021. Advised patient to see a nephrologist.

## 2021-12-18 NOTE — Assessment & Plan Note (Signed)
Will refill amlodipine 5 mg. Patient's son was informed. Patient will have a nurse visit on Thursday to medication check.

## 2021-12-19 ENCOUNTER — Other Ambulatory Visit: Payer: Self-pay | Admitting: *Deleted

## 2021-12-19 ENCOUNTER — Other Ambulatory Visit: Payer: Self-pay | Admitting: Internal Medicine

## 2021-12-19 MED ORDER — PREDNISONE 5 MG PO TABS: 5.0000 mg | ORAL_TABLET | Freq: Every day | ORAL | 0 refills | Status: DC

## 2021-12-19 NOTE — Telephone Encounter (Signed)
Patients son called to request refill on prednisone. He reports patient has been out of prednisone and is concerned about decreased appetite and weight loss.

## 2021-12-20 ENCOUNTER — Encounter: Payer: Self-pay | Admitting: Internal Medicine

## 2021-12-22 ENCOUNTER — Ambulatory Visit: Payer: Medicare HMO | Admitting: Nurse Practitioner

## 2021-12-22 NOTE — Progress Notes (Addendum)
Patient presents for medication review. All Medications were verified and list is up to date. Patient was given a new medication list to keep with him. Mrs. Toy Care, NP has increased patient to 10 mg amlodipine. Mr. Mascio son Ronalee Belts was contacted and was informed of medication change. Mr Bielby will return to office in 1 month and the medication will be sent in for 10 mg at that time per Mrs. Toy Care, NP.

## 2021-12-26 ENCOUNTER — Other Ambulatory Visit: Payer: Self-pay | Admitting: Internal Medicine

## 2021-12-26 ENCOUNTER — Encounter: Payer: Self-pay | Admitting: Internal Medicine

## 2021-12-26 ENCOUNTER — Ambulatory Visit (INDEPENDENT_AMBULATORY_CARE_PROVIDER_SITE_OTHER): Payer: Medicare HMO | Admitting: Internal Medicine

## 2021-12-26 VITALS — BP 133/67 | HR 68 | Ht 68.0 in | Wt 127.0 lb

## 2021-12-26 DIAGNOSIS — Z716 Tobacco abuse counseling: Secondary | ICD-10-CM

## 2021-12-26 DIAGNOSIS — R69 Illness, unspecified: Secondary | ICD-10-CM | POA: Diagnosis not present

## 2021-12-26 DIAGNOSIS — E039 Hypothyroidism, unspecified: Secondary | ICD-10-CM | POA: Diagnosis not present

## 2021-12-26 DIAGNOSIS — F419 Anxiety disorder, unspecified: Secondary | ICD-10-CM

## 2021-12-26 DIAGNOSIS — I10 Essential (primary) hypertension: Secondary | ICD-10-CM | POA: Diagnosis not present

## 2021-12-26 DIAGNOSIS — N1831 Chronic kidney disease, stage 3a: Secondary | ICD-10-CM | POA: Diagnosis not present

## 2021-12-26 DIAGNOSIS — J301 Allergic rhinitis due to pollen: Secondary | ICD-10-CM

## 2021-12-26 NOTE — Assessment & Plan Note (Signed)
Take Claritin 5 mg p.o. daily 

## 2021-12-26 NOTE — Assessment & Plan Note (Signed)
Anxiety is stable

## 2021-12-26 NOTE — Assessment & Plan Note (Signed)
Patient does not smoke anymore ?

## 2021-12-26 NOTE — Assessment & Plan Note (Signed)
Stable

## 2021-12-26 NOTE — Progress Notes (Signed)
Established Patient Office Visit  Subjective:  Patient ID: Edward Cummings, male    DOB: Sep 22, 1932  Age: 88 y.o. MRN: [id number]  CC:  Chief Complaint  Patient presents with   Hypertension    Hypertension    Edward Cummings presents for bp check  Past Medical History:  Diagnosis Date   Anemia    Anxiety 12/31/2019   Cancer [facility])    bladder   Chronic kidney disease    Depression    Hypertension    Neuromuscular disorder ([place])    Nerve damage to left face/eye since around 2000-09-12.    Past Surgical History:  Procedure Laterality Date   CYSTOSCOPY W/ RETROGRADES Bilateral 01/25/2018   Procedure: CYSTOSCOPY WITH RETROGRADE PYELOGRAM;  [name], MD;  [name];  Service: Urology;  Laterality: Bilateral;   CYSTOSCOPY W/ URETERAL STENT PLACEMENT Bilateral 01/06/2019   Procedure: CYSTOSCOPY WITH RETROGRADE PYELOGRAM/URETERAL STENT REMOVAL;  [name], MD;  [name];  Service: Urology;  Laterality: Bilateral;   CYSTOSCOPY WITH BIOPSY N/A 09/21/2020   Procedure: CYSTOSCOPY WITH BLADDER BIOPSY;  [name], MD;  [name];  Service: Urology;  [name];   CYSTOSCOPY WITH FULGERATION N/A 09/21/2020   Procedure: CYSTOSCOPY WITH FULGERATION;  [name], MD;  [name];  Service: Urology;  [name];   CYSTOSCOPY WITH STENT PLACEMENT Bilateral 01/25/2018   Procedure: CYSTOSCOPY WITH STENT PLACEMENT;  [name], MD;  [name];  Service: Urology;  Laterality: Bilateral;   DORSAL SLIT N/A 01/06/2019   Procedure: DORSAL SLIT;  [name], MD;  [name];  Service: Urology;  [name];   ESOPHAGOGASTRODUODENOSCOPY (EGD) WITH PROPOFOL N/A 11/12/2019   Procedure: ESOPHAGOGASTRODUODENOSCOPY (EGD) WITH PROPOFOL;  Surgeon: Virgel Manifold, MD;  Location: [facility];  Service: Endoscopy;  [name];   EYE SURGERY     Cornea  transplants bilaterally & cataract surgery.   TRANSURETHRAL RESECTION OF BLADDER TUMOR N/A 01/25/2018   Procedure: TRANSURETHRAL RESECTION OF BLADDER TUMOR (TURBT);  [name], MD;  [name];  Service: Urology;  [name];    Family History  Problem Relation Age of Onset   Prostate cancer Neg Hx    Kidney cancer Neg Hx    Bladder Cancer Neg Hx     Social History   Socioeconomic History   Marital status: Widowed    Spouse name: Edward Cummings   Number of children: 2   Years of education: 6th grade   Highest education level: 6th grade  Occupational History   Not on file  Tobacco Use   Smoking status: Former    Types: Cigarettes    Quit date: Sep 12, 2008    Years since quitting: 13.5   Smokeless tobacco: Current    Types: Chew   Tobacco comments:    Stopped approximately 10 years ago.  Vaping Use   Vaping Use: Never used  Substance and Sexual Activity   Alcohol use: Yes    Alcohol/week: 2.0 standard drinks of alcohol    Types: 2 Cans of beer per week    Comment: occasionally   Drug use: Never   Sexual activity: Not Currently    Birth control/protection: None  Other Topics Concern   Not on file  Social History Narrative    lives in [place]; with oldest son. Wife died in 2018-09-13.  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 Pt still drives and is indepent with no serious chronic disease. Worked in [place] Strain: Low Risk  (06/23/2021)   Overall Financial Resource Strain (CARDIA)    Difficulty of Paying Living Expenses: Not very hard  Food Insecurity: No Food Insecurity (12/01/2021)   Hunger Vital Sign    Worried About Running Out of Food in the Last Year: Never true    Ran Out of Food in the Last Year: Never true  Transportation Needs: No Transportation Needs (12/01/2021)   PRAPARE - Hydrologist (Medical): No     Lack of Transportation (Non-Medical): No  Physical Activity: Insufficiently Active (05/27/2021)   Exercise Vital Sign    Days of Exercise per Week: 5 days    Minutes of Exercise per Session: 10 min  Stress: No Stress Concern Present (05/27/2021)   [place]    Feeling of Stress : Only a little  Social Connections: Unknown (05/27/2021)   Social Connection and Isolation Panel [NHANES]    Frequency of Communication with Friends and Family: More than three times a week    Frequency of Social Gatherings with Friends and Family: Three times a week    Attends Religious Services: Not on file    Active Member of Clubs or Organizations: Not on file    Attends Club or Organization Meetings: Not on file    Marital Status: Widowed  Intimate Partner Violence: Not At Risk (05/27/2021)   Humiliation, Afraid, Rape, and Kick questionnaire    Fear of Current or Ex-Partner: No    Emotionally Abused: No    Physically Abused: No    Sexually Abused: No     Current Outpatient Medications:    amLODipine (NORVASC) 5 MG tablet, Take 1 tablet (5 mg total) by mouth daily., Disp: 90 tablet, Rfl: 2   aspirin EC 81 MG tablet, Take 81 mg by mouth daily., Disp: , Rfl:    levothyroxine (SYNTHROID) 75 MCG tablet, Take 1 tablet (75 mcg total) by mouth daily before breakfast., Disp: 30 tablet, Rfl: 3   loratadine (CLARITIN) 10 MG tablet, Take 1 tablet (10 mg total) by mouth daily., Disp: 30 tablet, Rfl: 11   mometasone (NASONEX) 50 MCG/ACT nasal spray, Place 2 sprays into the nose daily., Disp: 1 each, Rfl: 12   Multiple Vitamin (MULTIVITAMIN WITH MINERALS) TABS tablet, Take 1 tablet by mouth daily. , Disp: , Rfl:    prednisoLONE acetate (PRED FORTE) 1 % ophthalmic suspension, Place 1 drop into both eyes daily at 2 PM., Disp: 5 mL, Rfl: 4   predniSONE (DELTASONE) 5 MG tablet, Take 1 tablet (5 mg total) by mouth daily with breakfast., Disp: 60 tablet, Rfl:  0   No Known Allergies  ROS Review of Systems  Constitutional: Negative.   HENT: Negative.    Eyes: Negative.   Respiratory: Negative.    Cardiovascular: Negative.   Gastrointestinal: Negative.   Endocrine: Negative.   Genitourinary: Negative.   Musculoskeletal: Negative.   Skin: Negative.   Allergic/Immunologic: Negative.   Neurological: Negative.   Hematological: Negative.   Psychiatric/Behavioral: Negative.    All other systems reviewed and are negative.     Objective:    Physical Exam Vitals reviewed.  Constitutional:      Appearance: Normal appearance.  HENT:     Mouth/Throat:     Mouth: Mucous membranes are  moist.  Eyes:     Pupils: Pupils are equal, round, and reactive to light.  Neck:     Vascular: No carotid bruit.  Cardiovascular:     Rate and Rhythm: Normal rate and regular rhythm.     Pulses: Normal pulses.     Heart sounds: Normal heart sounds.  Pulmonary:     Effort: Pulmonary effort is normal.     Breath sounds: Normal breath sounds.  Abdominal:     General: Bowel sounds are normal.     Palpations: Abdomen is soft. There is no hepatomegaly, splenomegaly or mass.     Tenderness: There is no abdominal tenderness.     Hernia: No hernia is present.  Musculoskeletal:     Cervical back: Neck supple.     Right lower leg: No edema.     Left lower leg: No edema.  Skin:    Findings: No rash.  Neurological:     Mental Status: He is alert and oriented to person, place, and time.     Motor: No weakness.  Psychiatric:        Mood and Affect: Mood normal.        Behavior: Behavior normal.     BP 133/67   Pulse 68   Ht '5\' 8"'$  (1.727 m)   Wt 127 lb (57.6 kg)   BMI 19.31 kg/m  Wt Readings from Last 3 Encounters:  12/26/21 127 lb (57.6 kg)  12/16/21 126 lb 3.2 oz (57.2 kg)  11/11/21 133 lb 8 oz (60.6 kg)     Health Maintenance Due  Topic Date Due   TETANUS/TDAP  Never done   COVID-19 Vaccine (4 - Booster for Pfizer series) 05/04/2020     There are no preventive care reminders to display for this patient.  Lab Results  Component Value Date   TSH 0.094 (L) 10/20/2021   Lab Results  Component Value Date   WBC 9.2 10/20/2021   HGB 10.9 (L) 10/20/2021   HCT 33.3 (L) 10/20/2021   MCV 98.8 10/20/2021   PLT 219 10/20/2021   Lab Results  Component Value Date   NA 135 10/20/2021   K 4.4 10/20/2021   CO2 23 10/20/2021   GLUCOSE 95 10/20/2021   BUN 32 (H) 10/20/2021   CREATININE 2.26 (H) 10/20/2021   BILITOT 0.6 10/20/2021   ALKPHOS 60 10/20/2021   AST 17 10/20/2021   ALT 16 10/20/2021   PROT 6.6 10/20/2021   ALBUMIN 3.7 10/20/2021   CALCIUM 8.9 10/20/2021   ANIONGAP 4 (L) 10/20/2021   No results found for: "CHOL" No results found for: "HDL" No results found for: "LDLCALC" No results found for: "TRIG" No results found for: "CHOLHDL" Lab Results  Component Value Date   HGBA1C 4.5 (L) 04/13/2018      Assessment & Plan:   Problem List Items Addressed This Visit       Cardiovascular and Mediastinum   Hypertension - Primary     Patient denies any chest pain or shortness of breath there is no history of palpitation or paroxysmal nocturnal dyspnea   patient was advised to follow low-salt low-cholesterol diet    ideally I want to keep systolic blood pressure below 130 mmHg, patient was asked to check blood pressure one times a week and give me a report on that.  Patient will be follow-up in 3 months  or earlier as needed, patient will call me back for any change in the cardiovascular symptoms Patient was advised to buy  a book from local bookstore concerning blood pressure and read several chapters  every day.  This will be supplemented by some of the material we will give him from the office.  Patient should also utilize other resources like YouTube and Internet to learn more about the blood pressure and the diet.        Respiratory   Seasonal allergic rhinitis due to pollen    Take Claritin 5 mg p.o.  daily        Endocrine   Hypothyroidism    Stable at the present time        Genitourinary   Stage 3a chronic kidney disease (HCC)    Stable        Other   Anxiety    Anxiety is stable      Tobacco abuse counseling    Patient does not smoke anymore       No orders of the defined types were placed in this encounter.   Follow-up: No follow-ups on file.    [name], MD

## 2021-12-26 NOTE — Assessment & Plan Note (Signed)

## 2021-12-26 NOTE — Assessment & Plan Note (Signed)
Stable at the present time. 

## 2021-12-27 ENCOUNTER — Other Ambulatory Visit: Payer: Self-pay | Admitting: *Deleted

## 2021-12-27 NOTE — Patient Outreach (Signed)
[facility]) Care Management  12/27/2021  GEHRIG PATRAS [birth year] [id number]  Nurse spoke with patient to inform them that their PCP has a RN Care Coordinator that will follow up with them in the future. Patient verbalized understanding and was appreciative of the care this nurse has given him.   Plan: RN Health Coach will close case.  Emelia Loron RN, BSN [place] [phone] [email or web address]

## 2022-01-02 ENCOUNTER — Other Ambulatory Visit: Payer: Self-pay

## 2022-01-10 ENCOUNTER — Other Ambulatory Visit: Payer: Self-pay

## 2022-01-10 ENCOUNTER — Other Ambulatory Visit: Payer: Self-pay | Admitting: Internal Medicine

## 2022-01-10 DIAGNOSIS — I10 Essential (primary) hypertension: Secondary | ICD-10-CM

## 2022-01-20 ENCOUNTER — Ambulatory Visit
Admission: RE | Admit: 2022-01-20 | Discharge: 2022-01-20 | Disposition: A | Discharge status: Home / Self Care | Payer: Medicare HMO | Source: Ambulatory Visit | Attending: Internal Medicine | Admitting: Internal Medicine

## 2022-01-20 ENCOUNTER — Encounter: Payer: Self-pay | Admitting: Nurse Practitioner

## 2022-01-20 ENCOUNTER — Ambulatory Visit (INDEPENDENT_AMBULATORY_CARE_PROVIDER_SITE_OTHER): Payer: Medicare HMO | Admitting: Nurse Practitioner

## 2022-01-20 VITALS — BP 128/61 | HR 67 | Ht 68.0 in | Wt 129.5 lb

## 2022-01-20 DIAGNOSIS — K5730 Diverticulosis of large intestine without perforation or abscess without bleeding: Secondary | ICD-10-CM | POA: Diagnosis not present

## 2022-01-20 DIAGNOSIS — J432 Centrilobular emphysema: Secondary | ICD-10-CM | POA: Diagnosis not present

## 2022-01-20 DIAGNOSIS — J949 Pleural condition, unspecified: Secondary | ICD-10-CM | POA: Diagnosis not present

## 2022-01-20 DIAGNOSIS — C678 Malignant neoplasm of overlapping sites of bladder: Secondary | ICD-10-CM | POA: Insufficient documentation

## 2022-01-20 DIAGNOSIS — C679 Malignant neoplasm of bladder, unspecified: Secondary | ICD-10-CM | POA: Diagnosis not present

## 2022-01-20 DIAGNOSIS — J301 Allergic rhinitis due to pollen: Secondary | ICD-10-CM

## 2022-01-20 DIAGNOSIS — I10 Essential (primary) hypertension: Secondary | ICD-10-CM | POA: Diagnosis not present

## 2022-01-20 DIAGNOSIS — Z8554 Personal history of malignant neoplasm of ureter: Secondary | ICD-10-CM | POA: Diagnosis not present

## 2022-01-20 DIAGNOSIS — K449 Diaphragmatic hernia without obstruction or gangrene: Secondary | ICD-10-CM | POA: Diagnosis not present

## 2022-01-20 DIAGNOSIS — E039 Hypothyroidism, unspecified: Secondary | ICD-10-CM

## 2022-01-20 DIAGNOSIS — J929 Pleural plaque without asbestos: Secondary | ICD-10-CM | POA: Diagnosis not present

## 2022-01-20 NOTE — Assessment & Plan Note (Signed)
Patient BP 128/61in the office today.  Encouraged patient to consume low sodium and heart healthy diet. Continue amlodipine

## 2022-01-20 NOTE — Assessment & Plan Note (Signed)
Stable at present. Continue Synthyroid 75 mcg

## 2022-01-20 NOTE — Assessment & Plan Note (Signed)
Stable. Continue loratadine 10 mg as needed

## 2022-01-20 NOTE — Progress Notes (Signed)
Established Patient Office Visit  Subjective:  Patient ID: Edward Cummings, male    DOB: 10/25/1932  Age: 88 y.o. MRN: [id number]  CC:  Chief Complaint  Patient presents with   Follow-up    1 month bp follow up     HPI  [name] presents for one  month follow up on his BP. He checks BP at home and the systolic reading of 425'Z. He is doing well. No other concerns at present. He has gained 3 lb in a month.   HPI   Past Medical History:  Diagnosis Date   Anemia    Anxiety 12/31/2019   Cancer [facility])    bladder   Chronic kidney disease    Depression    Hypertension    Neuromuscular disorder ([place])    Nerve damage to left face/eye since around 2002.    Past Surgical History:  Procedure Laterality Date   CYSTOSCOPY W/ RETROGRADES Bilateral 01/25/2018   Procedure: CYSTOSCOPY WITH RETROGRADE PYELOGRAM;  [name], MD;  [name];  Service: Urology;  Laterality: Bilateral;   CYSTOSCOPY W/ URETERAL STENT PLACEMENT Bilateral 01/06/2019   Procedure: CYSTOSCOPY WITH RETROGRADE PYELOGRAM/URETERAL STENT REMOVAL;  [name], MD;  [name];  Service: Urology;  Laterality: Bilateral;   CYSTOSCOPY WITH BIOPSY N/A 09/21/2020   Procedure: CYSTOSCOPY WITH BLADDER BIOPSY;  [name], MD;  [name];  Service: Urology;  [name];   CYSTOSCOPY WITH FULGERATION N/A 09/21/2020   Procedure: CYSTOSCOPY WITH FULGERATION;  [name], MD;  [name];  Service: Urology;  [name];   CYSTOSCOPY WITH STENT PLACEMENT Bilateral 01/25/2018   Procedure: CYSTOSCOPY WITH STENT PLACEMENT;  [name], MD;  [name];  Service: Urology;  Laterality: Bilateral;   DORSAL SLIT N/A 01/06/2019   Procedure: DORSAL SLIT;  [name], MD;  [name];  Service: Urology;  [name];   ESOPHAGOGASTRODUODENOSCOPY (EGD) WITH PROPOFOL N/A 11/12/2019   Procedure:  ESOPHAGOGASTRODUODENOSCOPY (EGD) WITH PROPOFOL;  Surgeon: Virgel Manifold, MD;  Location: [facility];  Service: Endoscopy;  [name];   EYE SURGERY     Cornea transplants bilaterally & cataract surgery.   TRANSURETHRAL RESECTION OF BLADDER TUMOR N/A 01/25/2018   Procedure: TRANSURETHRAL RESECTION OF BLADDER TUMOR (TURBT);  [name], MD;  [name];  Service: Urology;  [name];    Family History  Problem Relation Age of Onset   Prostate cancer Neg Hx    Kidney cancer Neg Hx    Bladder Cancer Neg Hx     Social History   Socioeconomic History   Marital status: Widowed    Spouse name: [name]   Number of children: 2   Years of education: 6th grade   Highest education level: 6th grade  Occupational History   Not on file  Tobacco Use   Smoking status: Former    Types: Cigarettes    Quit date: 2010    Years since quitting: 13.6   Smokeless tobacco: Current    Types: Chew   Tobacco comments:    Stopped approximately 10 years ago.  Vaping Use   Vaping Use: Never used  Substance and Sexual Activity   Alcohol use: Yes    Alcohol/week: 2.0 standard drinks of alcohol    Types: 2 Cans of beer per week    Comment: occasionally   Drug use: Never   Sexual activity:  Not Currently    Birth control/protection: None  Other Topics Concern   Not on file  Social History Narrative    lives in [place]; with oldest son. Wife died in 09-11-18.  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 Pt still drives and is indepent with no serious chronic disease. Worked in [employer] Strain: Low Risk  (06/23/2021)   Overall Financial Resource Strain (CARDIA)    Difficulty of Paying Living Expenses: Not very hard  Food Insecurity: No Food Insecurity (12/01/2021)   Hunger Vital Sign    Worried About Running Out of Food in the Last Year: Never true     Ran Out of Food in the Last Year: Never true  Transportation Needs: No Transportation Needs (12/01/2021)   PRAPARE - Hydrologist (Medical): No    Lack of Transportation (Non-Medical): No  Physical Activity: Insufficiently Active (05/27/2021)   Exercise Vital Sign    Days of Exercise per Week: 5 days    Minutes of Exercise per Session: 10 min  Stress: No Stress Concern Present (05/27/2021)   [place]    Feeling of Stress : Only a little  Social Connections: Unknown (05/27/2021)   Social Connection and Isolation Panel [NHANES]    Frequency of Communication with Friends and Family: More than three times a week    Frequency of Social Gatherings with Friends and Family: Three times a week    Attends Religious Services: Not on file    Active Member of Clubs or Organizations: Not on file    Attends Club or Organization Meetings: Not on file    Marital Status: Widowed  Intimate Partner Violence: Not At Risk (05/27/2021)   Humiliation, Afraid, Rape, and Kick questionnaire    Fear of Current or Ex-Partner: No    Emotionally Abused: No    Physically Abused: No    Sexually Abused: No     Outpatient Medications Prior to Visit  Medication Sig Dispense Refill   amLODipine (NORVASC) 10 MG tablet Take 10 mg by mouth daily.     aspirin EC 81 MG tablet Take 81 mg by mouth daily.     levothyroxine (SYNTHROID) 75 MCG tablet Take 1 tablet (75 mcg total) by mouth daily before breakfast. 30 tablet 3   loratadine (CLARITIN) 10 MG tablet TAKE 1 TABLET BY MOUTH ONCE A DAY 30 tablet 11   mometasone (NASONEX) 50 MCG/ACT nasal spray PLACE 2 SPRAYS INTO THE NOSE DAILY 17 g 3   Multiple Vitamin (MULTIVITAMIN WITH MINERALS) TABS tablet Take 1 tablet by mouth daily.      prednisoLONE acetate (PRED FORTE) 1 % ophthalmic suspension Place 1 drop into both eyes daily at 2 PM. 5 mL 4   predniSONE (DELTASONE) 5 MG tablet  Take 1 tablet (5 mg total) by mouth daily with breakfast. 60 tablet 0   amLODipine (NORVASC) 5 MG tablet Take 1 tablet (5 mg total) by mouth daily. (Patient not taking: Reported on 01/20/2022) 90 tablet 2   No facility-administered medications prior to visit.    No Known Allergies  ROS Review of Systems  Constitutional:  Negative for activity change, chills and fever.  HENT:  Negative for congestion, ear pain, postnasal drip and sore throat.   Eyes:  Negative for discharge and redness.  Respiratory:  Negative for apnea and shortness  of breath.   Cardiovascular:  Negative for chest pain and palpitations.  Gastrointestinal:  Negative for abdominal distention, blood in stool and nausea.  Genitourinary:  Negative for difficulty urinating, flank pain and hematuria.  Musculoskeletal:  Positive for back pain. Negative for arthralgias and neck pain.  Skin:  Negative for color change and rash.  Neurological:  Negative for dizziness, numbness and headaches.  Psychiatric/Behavioral:  Negative for agitation, behavioral problems and confusion.       Objective:    Physical Exam Constitutional:      Appearance: Normal appearance. He is normal weight.  HENT:     Head: Normocephalic and atraumatic.     Right Ear: Tympanic membrane normal.     Left Ear: Tympanic membrane normal.     Nose: Nose normal.     Mouth/Throat:     Mouth: Mucous membranes are moist.     Dentition: No gum lesions (edentulous).     Tonsils: No tonsillar exudate.  Eyes:     Extraocular Movements: Extraocular movements intact.     Conjunctiva/sclera: Conjunctivae normal.     Pupils: Pupils are equal, round, and reactive to light.     Comments: Dropping on left eyelid  Cardiovascular:     Rate and Rhythm: Normal rate and regular rhythm.     Pulses: Normal pulses.     Heart sounds: Normal heart sounds.  Pulmonary:     Effort: Pulmonary effort is normal.     Breath sounds: Normal breath sounds.  Abdominal:      General: Abdomen is flat. Bowel sounds are normal.     Palpations: Abdomen is soft.  Musculoskeletal:        General: Normal range of motion.     Cervical back: Normal range of motion.  Skin:    General: Skin is warm.     Capillary Refill: Capillary refill takes less than 2 seconds.  Neurological:     General: No focal deficit present.     Mental Status: He is alert and oriented to person, place, and time. Mental status is at baseline.  Psychiatric:        Mood and Affect: Mood normal.        Behavior: Behavior normal.        Thought Content: Thought content normal.        Judgment: Judgment normal.     BP 128/61   Pulse 67   Ht '5\' 8"'$  (1.727 m)   Wt 129 lb 8 oz (58.7 kg)   BMI 19.69 kg/m  Wt Readings from Last 3 Encounters:  01/20/22 129 lb 8 oz (58.7 kg)  12/26/21 127 lb (57.6 kg)  12/16/21 126 lb 3.2 oz (57.2 kg)     Health Maintenance Due  Topic Date Due   TETANUS/TDAP  Never done   COVID-19 Vaccine (4 - Pfizer risk series) 05/04/2020   INFLUENZA VACCINE  01/10/2022    There are no preventive care reminders to display for this patient.  Lab Results  Component Value Date   TSH 0.094 (L) 10/20/2021   Lab Results  Component Value Date   WBC 9.2 10/20/2021   HGB 10.9 (L) 10/20/2021   HCT 33.3 (L) 10/20/2021   MCV 98.8 10/20/2021   PLT 219 10/20/2021   Lab Results  Component Value Date   NA 135 10/20/2021   K 4.4 10/20/2021   CO2 23 10/20/2021   GLUCOSE 95 10/20/2021   BUN 32 (H) 10/20/2021   CREATININE 2.26 (H) 10/20/2021  BILITOT 0.6 10/20/2021   ALKPHOS 60 10/20/2021   AST 17 10/20/2021   ALT 16 10/20/2021   PROT 6.6 10/20/2021   ALBUMIN 3.7 10/20/2021   CALCIUM 8.9 10/20/2021   ANIONGAP 4 (L) 10/20/2021   No results found for: "CHOL" No results found for: "HDL" No results found for: "LDLCALC" No results found for: "TRIG" No results found for: "CHOLHDL" Lab Results  Component Value Date   HGBA1C 4.5 (L) 04/13/2018      Assessment &  Plan:   Problem List Items Addressed This Visit       Cardiovascular and Mediastinum   Hypertension - Primary    Patient BP 128/61in the office today.  Encouraged patient to consume low sodium and heart healthy diet. Continue amlodipine         Relevant Medications   amLODipine (NORVASC) 10 MG tablet     Respiratory   Seasonal allergic rhinitis due to pollen    Stable. Continue loratadine 10 mg as needed        Endocrine   Hypothyroidism    Stable at present. Continue Synthyroid 75 mcg      No orders of the defined types were placed in this encounter.   Follow-up: Return in about 2 months (around 03/22/2022).    Theresia Lo, NP

## 2022-01-23 MED FILL — Iron Sucrose Inj 20 MG/ML (Fe Equiv): INTRAVENOUS | Qty: 10 | Status: AC

## 2022-01-24 ENCOUNTER — Inpatient Hospital Stay: Payer: Medicare HMO

## 2022-01-24 ENCOUNTER — Encounter: Payer: Self-pay | Admitting: Internal Medicine

## 2022-01-24 ENCOUNTER — Inpatient Hospital Stay: Payer: Medicare HMO | Attending: Internal Medicine | Admitting: Internal Medicine

## 2022-01-24 DIAGNOSIS — N183 Chronic kidney disease, stage 3 unspecified: Secondary | ICD-10-CM | POA: Diagnosis not present

## 2022-01-24 DIAGNOSIS — E039 Hypothyroidism, unspecified: Secondary | ICD-10-CM | POA: Diagnosis not present

## 2022-01-24 DIAGNOSIS — D631 Anemia in chronic kidney disease: Secondary | ICD-10-CM | POA: Insufficient documentation

## 2022-01-24 DIAGNOSIS — C772 Secondary and unspecified malignant neoplasm of intra-abdominal lymph nodes: Secondary | ICD-10-CM | POA: Insufficient documentation

## 2022-01-24 DIAGNOSIS — Z87891 Personal history of nicotine dependence: Secondary | ICD-10-CM | POA: Diagnosis not present

## 2022-01-24 DIAGNOSIS — C678 Malignant neoplasm of overlapping sites of bladder: Secondary | ICD-10-CM | POA: Diagnosis not present

## 2022-01-24 DIAGNOSIS — Z79899 Other long term (current) drug therapy: Secondary | ICD-10-CM | POA: Diagnosis not present

## 2022-01-24 LAB — CBC WITH DIFFERENTIAL/PLATELET
Abs Immature Granulocytes: 0.03 10*3/uL (ref 0.00–0.07)
Basophils Absolute: 0.1 10*3/uL (ref 0.0–0.1)
Basophils Relative: 1 %
Eosinophils Absolute: 0.3 10*3/uL (ref 0.0–0.5)
Eosinophils Relative: 3 %
HCT: 34.1 % — ABNORMAL LOW (ref 39.0–52.0)
Hemoglobin: 11.1 g/dL — ABNORMAL LOW (ref 13.0–17.0)
Immature Granulocytes: 0 %
Lymphocytes Relative: 11 %
Lymphs Abs: 1.1 10*3/uL (ref 0.7–4.0)
MCH: 31.9 pg (ref 26.0–34.0)
MCHC: 32.6 g/dL (ref 30.0–36.0)
MCV: 98 fL (ref 80.0–100.0)
Monocytes Absolute: 0.4 10*3/uL (ref 0.1–1.0)
Monocytes Relative: 4 %
Neutro Abs: 8.4 10*3/uL — ABNORMAL HIGH (ref 1.7–7.7)
Neutrophils Relative %: 81 %
Platelets: 248 10*3/uL (ref 150–400)
RBC: 3.48 MIL/uL — ABNORMAL LOW (ref 4.22–5.81)
RDW: 13.2 % (ref 11.5–15.5)
WBC: 10.3 10*3/uL (ref 4.0–10.5)
nRBC: 0 % (ref 0.0–0.2)

## 2022-01-24 LAB — COMPREHENSIVE METABOLIC PANEL
ALT: 19 U/L (ref 0–44)
AST: 23 U/L (ref 15–41)
Albumin: 3.9 g/dL (ref 3.5–5.0)
Alkaline Phosphatase: 85 U/L (ref 38–126)
Anion gap: 7 (ref 5–15)
BUN: 25 mg/dL — ABNORMAL HIGH (ref 8–23)
CO2: 23 mmol/L (ref 22–32)
Calcium: 9.2 mg/dL (ref 8.9–10.3)
Chloride: 107 mmol/L (ref 98–111)
Creatinine, Ser: 1.99 mg/dL — ABNORMAL HIGH (ref 0.61–1.24)
GFR, Estimated: 32 mL/min — ABNORMAL LOW (ref >60)
Glucose, Bld: 92 mg/dL (ref 70–99)
Potassium: 5 mmol/L (ref 3.5–5.1)
Sodium: 137 mmol/L (ref 135–145)
Total Bilirubin: 0.6 mg/dL (ref 0.3–1.2)
Total Protein: 7.4 g/dL (ref 6.5–8.1)

## 2022-01-24 NOTE — Progress Notes (Signed)
Problem with constipation and does take PRN stool softener.

## 2022-01-24 NOTE — Progress Notes (Signed)
Per MD, no venofer today.

## 2022-01-24 NOTE — Assessment & Plan Note (Addendum)
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  Stage IV- AUG 2023- No evidence for metastatic disease in the chest, abdomen, or Pelvis. STABLE. Continue surveillance; HOLD off any treatments.    #History of local recurrent bladder ca < 1cm agree with TURBT [march 2022] [Dr.Stoiof-cystoscopy NOV 2022]- s/p evaluation with urology.  Continue follow-up with urology.  # Pulmonary: SEP 2022- CT-emphysema /bronchial thickening /bilateral basilar groundglass opacities -?  Resolving  BOOP/ fibrosis Vs. fungal infection [s/p itraconazole]. CT AUG 2023--STABLE.    # Iatrogenic hypothyroidism-; TSH FEB 2023- TSH 10-currently on on Synthroid; 100 mcg wll repeat today-awaiting labs today.  # Anemia sec to CKD-III-IV/ on Iv iron.Hb-11-12-Clinically- STABLE; Hold Venofer today.  # CKD stageIII-IVGFR-31 -Clinically  STABLE ; recommend increase fluid intake.  #Weight loss improving; continue prednisone for appetite/also above pneumonitis- '5mg'$  /day.   # IV access: NO port/PIV  mychart-Thyroid  # DISPOSITION:  # HOLD venofer  # Follow-up in 3 months-;MD; cbc/cmp;iron studies;ferritin;  thyoid panel ;possible venofer;  Dr.B  # I reviewed the blood work- with the patient in detail; also reviewed the imaging independently [as summarized above]; and with the patient in detail.

## 2022-01-24 NOTE — Progress Notes (Signed)
[place] NOTE  Patient Care Team: [name], [name], [name], MD (Inactive) as Consulting Physician (Gastroenterology) Clyde Canterbury, MD as Referring Physician (Otolaryngology) Ottie Glazier, MD as Consulting Physician (Pulmonary Disease) Abbie Sons, MD (Urology)  CHIEF COMPLAINTS/PURPOSE OF CONSULTATION: Bladder cancer   Oncology History Overview Note  # AUG 2019-TRANSITIONAL CELL BLADDER CA [~ 4cm tumor] s/p cystoscopy [Dr.Stoiff]  with extensive angiolymphatic invasion; [name]; [name]; mediastinal uptake; right pubic rami uptake.  # [name]; Jan 18th 2021- switched to [place]- [pt preference; [name]; July 2020 cystoscopy-no evidence of bladder malignancy/Dr. Bernardo Heater- enlarged prostate [PSA- 0.95; 2021]  # Molecular testing- PDL-1 CPS- 20%; [name]  ;GOALS: palliative  CURRENT/MOST RECENT THERAPY:OPDIVO [C]     Cancer of overlapping sites of bladder ([place])  02/28/2018 - 06/09/2019 Chemotherapy   The patient had atezolizumab (TECENTRIQ) 1,[name], 1,200 mg, Intravenous, Once, [name],200 mg (02/28/2018), 1,200 mg (03/21/2018), 1,200 mg (04/11/2018), 1,200 mg (05/02/2018), 1,200 mg (06/13/2018), 1,200 mg (05/23/2018), 1,200 mg (07/04/2018), 1,200 mg (07/25/2018), 1,200 mg (08/15/2018), 1,200 mg (09/05/2018), 1,200 mg (10/25/2018), 1,200 mg (11/22/2018), 1,200 mg (12/20/2018), 1,200 mg (01/10/2019), 1,200 mg (01/31/2019), 1,200 mg (02/21/2019), 1,200 mg (03/14/2019), 1,200 mg  (04/04/2019), 1,200 mg (04/25/2019), 1,200 mg (05/16/2019), 1,200 mg (06/09/2019)  for chemotherapy treatment.    06/30/2019 - 06/17/2020 Chemotherapy   Patient is on Treatment Plan : BLADDER Nivolumab q14d      HISTORY OF PRESENTING ILLNESS: Patient ambulating independently.  Accompanied by his son.   [name] 88 y.o.  male with metastatic transitional carcinoma of the bladder most recently on Opdivo [currently on hold because of poor tolerance] is here for follow-up/CT scan.  Appetite is good.  No weight loss.  No nausea no vomiting.Continues to be on prednisone 5 mg a day.  No blood in stools or black-colored stools.  No new shortness of breath or cough.  Review of Systems  Constitutional:  Positive for malaise/fatigue. Negative for chills, diaphoresis and fever.  HENT:  Negative for nosebleeds and sore throat.   Eyes:  Negative for double vision.  Respiratory:  Negative for hemoptysis, sputum production and wheezing.   Cardiovascular:  Negative for chest pain, palpitations, orthopnea and leg swelling.  Gastrointestinal:  Negative for abdominal pain, blood in stool, diarrhea, heartburn, melena, nausea and vomiting.  Genitourinary:  Negative for dysuria.  Musculoskeletal:  Positive for back pain and joint pain.  Skin: Negative.  Negative for itching and rash.  Neurological:  Negative for tingling, focal weakness and headaches.  Endo/Heme/Allergies:  Does not bruise/bleed easily.  Psychiatric/Behavioral:  Negative for depression. The patient is not nervous/anxious and does not have insomnia.      MEDICAL HISTORY:  Past Medical History:  Diagnosis Date   Anemia    Anxiety 12/31/2019   Cancer [facility])    bladder   Chronic kidney disease    Depression    Hypertension    Neuromuscular disorder ([place])    Nerve damage to left face/eye since around 2002.    SURGICAL HISTORY:  Past Surgical History:  Procedure Laterality Date   CYSTOSCOPY W/ RETROGRADES Bilateral 01/25/2018    Procedure: CYSTOSCOPY WITH RETROGRADE PYELOGRAM;  [name], MD;  [name];  Service: Urology;  Laterality: Bilateral;   CYSTOSCOPY W/ URETERAL STENT PLACEMENT Bilateral 01/06/2019   Procedure: CYSTOSCOPY WITH RETROGRADE PYELOGRAM/URETERAL STENT REMOVAL;  [name], MD;  [name];  Service: Urology;  Laterality: Bilateral;   CYSTOSCOPY WITH BIOPSY N/A 09/21/2020   Procedure: CYSTOSCOPY WITH BLADDER BIOPSY;  [name], MD;  [name];  Service: Urology;  [name];   CYSTOSCOPY WITH FULGERATION N/A 09/21/2020   Procedure: CYSTOSCOPY WITH FULGERATION;  [name], MD;  [name];  Service: Urology;  [name];   CYSTOSCOPY WITH STENT PLACEMENT Bilateral 01/25/2018   Procedure: CYSTOSCOPY WITH STENT PLACEMENT;  [name], MD;  [name];  Service: Urology;  Laterality: Bilateral;   DORSAL SLIT N/A 01/06/2019   Procedure: DORSAL SLIT;  [name], MD;  [name];  Service: Urology;  [name];   ESOPHAGOGASTRODUODENOSCOPY (EGD) WITH PROPOFOL N/A 11/12/2019   Procedure: ESOPHAGOGASTRODUODENOSCOPY (EGD) WITH PROPOFOL;  Surgeon: Virgel Manifold, MD;  Location: [facility];  Service: Endoscopy;  [name];   EYE SURGERY     Cornea transplants bilaterally & cataract surgery.   TRANSURETHRAL RESECTION OF BLADDER TUMOR N/A 01/25/2018   Procedure: TRANSURETHRAL RESECTION OF BLADDER TUMOR (TURBT);  [name], MD;  [name];  Service: Urology;  [name];    SOCIAL HISTORY: Social History   Socioeconomic History   Marital status: Widowed    Spouse name: [name]   Number of children: 2   Years of education: 6th grade   Highest education level: 6th grade  Occupational History   Not on file  Tobacco Use   Smoking status: Former    Types: Cigarettes    Quit date: 22-Jul-2008    Years since quitting: 13.6    Smokeless tobacco: Current    Types: Chew   Tobacco comments:    Stopped approximately 10 years ago.  Vaping Use   Vaping Use: Never used  Substance and Sexual Activity   Alcohol use: Yes    Alcohol/week: 2.0 standard drinks of alcohol    Types: 2 Cans of beer per week    Comment: occasionally   Drug use: Never   Sexual activity: Not Currently    Birth control/protection: None  Other Topics Concern   Not on file  Social History Narrative    lives in [place]; with oldest son. Wife died in Jul 22, 2018.  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 Pt still drives and is indepent with no serious chronic disease. Worked in [place] Strain: Low Risk  (06/23/2021)   Overall Financial Resource Strain (CARDIA)    Difficulty of Paying Living Expenses: Not very hard  Food Insecurity: No Food Insecurity (12/01/2021)   Hunger Vital Sign    Worried About Running Out of Food in the Last Year: Never true    Ran Out of Food in the Last Year: Never true  Transportation Needs: No Transportation Needs (12/01/2021)   PRAPARE - Hydrologist (Medical): No    Lack of Transportation (Non-Medical): No  Physical Activity: Insufficiently Active (05/27/2021)   Exercise Vital Sign  Days of Exercise per Week: 5 days    Minutes of Exercise per Session: 10 min  Stress: No Stress Concern Present (05/27/2021)   [place]    Feeling of Stress : Only a little  Social Connections: Unknown (05/27/2021)   Social Connection and Isolation Panel [NHANES]    Frequency of Communication with Friends and Family: More than three times a week    Frequency of Social Gatherings with Friends and Family: Three times a week    Attends Religious Services: Not on file    Active Member of Clubs or Organizations: Not on file     Attends Club or Organization Meetings: Not on file    Marital Status: Widowed  Intimate Partner Violence: Not At Risk (05/27/2021)   Humiliation, Afraid, Rape, and Kick questionnaire    Fear of Current or Ex-Partner: No    Emotionally Abused: No    Physically Abused: No    Sexually Abused: No    FAMILY HISTORY: Family History  Problem Relation Age of Onset   Prostate cancer Neg Hx    Kidney cancer Neg Hx    Bladder Cancer Neg Hx     ALLERGIES:  has No Known Allergies.  MEDICATIONS:  Current Outpatient Medications  Medication Sig Dispense Refill   amLODipine (NORVASC) 10 MG tablet Take 10 mg by mouth daily.     aspirin EC 81 MG tablet Take 81 mg by mouth daily.     levothyroxine (SYNTHROID) 75 MCG tablet Take 1 tablet (75 mcg total) by mouth daily before breakfast. (Patient taking differently: Take 100 mcg by mouth daily before breakfast.) 30 tablet 3   loratadine (CLARITIN) 10 MG tablet TAKE 1 TABLET BY MOUTH ONCE A DAY 30 tablet 11   mometasone (NASONEX) 50 MCG/ACT nasal spray PLACE 2 SPRAYS INTO THE NOSE DAILY 17 g 3   Multiple Vitamin (MULTIVITAMIN WITH MINERALS) TABS tablet Take 1 tablet by mouth daily.      prednisoLONE acetate (PRED FORTE) 1 % ophthalmic suspension Place 1 drop into both eyes daily at 2 PM. 5 mL 4   predniSONE (DELTASONE) 5 MG tablet Take 1 tablet (5 mg total) by mouth daily with breakfast. 60 tablet 0   No current facility-administered medications for this visit.      Marland Kitchen  PHYSICAL EXAMINATION: ECOG PERFORMANCE STATUS: 1 - Symptomatic but completely ambulatory  Vitals:   01/24/22 1300  BP: 138/74  Pulse: 84  Resp: 16  Temp: 98.4 F (36.9 C)   Filed Weights   01/24/22 1300  Weight: 131 lb (59.4 kg)    Physical Exam HENT:     Head: Normocephalic and atraumatic.     Mouth/Throat:     Pharynx: No oropharyngeal exudate.  Eyes:     Pupils: Pupils are equal, round, and reactive to light.     Comments: Chronic drooping of the left eyelid.   Cardiovascular:     Rate and Rhythm: Normal rate and regular rhythm.  Pulmonary:     Effort: No respiratory distress.     Breath sounds: No wheezing.  Abdominal:     General: Bowel sounds are normal. There is no distension.     Palpations: Abdomen is soft. There is no mass.     Tenderness: There is no abdominal tenderness. There is no guarding or rebound.  Musculoskeletal:        General: No tenderness. Normal range of motion.     Cervical back: Normal range  of motion and neck supple.  Skin:    General: Skin is warm.  Neurological:     Mental Status: He is alert and oriented to person, place, and time.  Psychiatric:        Mood and Affect: Affect normal.      LABORATORY DATA:  I have reviewed the data as listed Lab Results  Component Value Date   WBC 10.3 01/24/2022   HGB 11.1 (L) 01/24/2022   HCT 34.1 (L) 01/24/2022   MCV 98.0 01/24/2022   PLT 248 01/24/2022   Recent Labs    07/20/21 0944 10/20/21 1030 01/24/22 1209  NA 138 135 137  K 4.4 4.4 5.0  CL 106 108 107  CO2 _0 GLUCOSE 92 95 92  BUN 32* 32* 25*  CREATININE 2.54* 2.26* 1.99*  CALCIUM 9.1 8.9 9.2  GFRNONAA 24* 27* 32*  PROT 7.1 6.6 7.4  ALBUMIN 3.7 3.7 3.9  AST _1 ALT _2 ALKPHOS 73 60 85  BILITOT 0.6 0.6 0.6    RADIOGRAPHIC STUDIES: I have personally reviewed the radiological images as listed and agreed with the findings in the report. CT CHEST ABDOMEN PELVIS WO CONTRAST  Result Date: 01/22/2022 CLINICAL DATA:  88 year old male with history of bladder cancer diagnosed in August 2019 status post TURBT and chemotherapy. Ureteral cancer. Follow-up study. * Tracking Code: BO * EXAM: CT CHEST, ABDOMEN AND PELVIS WITHOUT CONTRAST TECHNIQUE: Multidetector CT imaging of the chest, abdomen and pelvis was performed following the standard protocol without IV contrast. RADIATION DOSE REDUCTION: This exam was performed according to the departmental dose-optimization program which includes  automated exposure control, adjustment of the mA and/or kV according to patient size and/or use of iterative reconstruction technique. COMPARISON:  CT of the chest, abdomen and pelvis 08/02/2021. FINDINGS: CT CHEST FINDINGS Cardiovascular: Heart size is normal. There is no significant pericardial fluid, thickening or pericardial calcification. There is aortic atherosclerosis, as well as atherosclerosis of the great vessels of the mediastinum and the coronary arteries, including calcified atherosclerotic plaque in the left main, left anterior descending, left circumflex and right coronary arteries. Mild calcifications of the aortic valve. Moderate calcifications of the mitral annulus. Mediastinum/Nodes: No pathologically enlarged mediastinal or hilar lymph nodes. Please note that accurate exclusion of hilar adenopathy is limited on noncontrast CT scans. Moderate-sized hiatal hernia. No axillary lymphadenopathy. Lungs/Pleura: No suspicious appearing pulmonary nodules or masses are noted. No acute consolidative airspace disease. No pleural effusions. Diffuse bronchial wall thickening with mild centrilobular and paraseptal emphysema. In addition, there are widespread but patchy areas of ground-glass attenuation, septal thickening, subpleural reticulation, traction bronchiectasis and peripheral bronchiolectasis in the lung bases bilaterally, concerning for interstitial lung disease. Calcified pleural plaques bilaterally, indicative of asbestos related pleural disease. Musculoskeletal: There are no aggressive appearing lytic or blastic lesions noted in the visualized portions of the skeleton. CT ABDOMEN PELVIS FINDINGS Hepatobiliary: No definite suspicious cystic or solid hepatic lesions are confidently identified on today's noncontrast CT examination. Unenhanced appearance of the gallbladder is unremarkable. Pancreas: No definite pancreatic mass or peripancreatic fluid collections or inflammatory changes are noted on  today's noncontrast CT examination. Spleen: Unremarkable. Adrenals/Urinary Tract: Multiple low-attenuation lesions in both kidneys, incompletely characterized on today's noncontrast CT examination, but similar to the prior study and statistically likely to represent cysts, measuring up to 2.1 cm in diameter in the lateral aspect of the interpolar region of the right kidney (no imaging follow-up is recommended). No hydroureteronephrosis. Bilateral  adrenal glands are normal in appearance. Urinary bladder is unremarkable in appearance. Stomach/Bowel: Unenhanced appearance of the intra-abdominal portion of the stomach is unremarkable. No pathologic dilatation of small bowel or colon. A few scattered colonic diverticula are noted, without surrounding inflammatory changes to indicate an acute diverticulitis at this time. Normal appendix. Vascular/Lymphatic: Extensive atherosclerosis throughout the abdominal and pelvic vasculature. No lymphadenopathy noted in the abdomen or pelvis. Reproductive: Prostate gland and seminal vesicles are unremarkable in appearance. Other: No significant volume of ascites.  No pneumoperitoneum. Musculoskeletal: There are no aggressive appearing lytic or blastic lesions noted in the visualized portions of the skeleton. IMPRESSION: 1. No definitive findings to suggest metastatic disease in the chest, abdomen or pelvis on today's noncontrast CT examination. 2. Moderate-sized hiatal hernia. 3. Asbestos related pleural disease redemonstrated, as above. There is also evidence of interstitial lung disease, with a spectrum of findings considered probable usual interstitial pneumonia (UIP) per current ATS guidelines; findings which could be a manifestation of asbestosis. Outpatient referral to Pulmonology for further clinical evaluation is recommended. 4. Aortic atherosclerosis, in addition to left main and three-vessel coronary artery disease. 5. There are calcifications of the aortic valve.  Echocardiographic correlation for evaluation of potential valvular dysfunction may be warranted if clinically indicated. 6. Colonic diverticulosis without evidence of acute diverticulitis at this time. 7. Additional incidental findings, as above. Electronically Signed   By: Vinnie Langton M.D.   On: 01/22/2022 11:22     ASSESSMENT & PLAN:   Cancer of overlapping sites of bladder ([place]) # High-grade transitional cell carcinoma of the bladder metastatic to retroperitoneal lymph node.  Stage IV- AUG 2023- No evidence for metastatic disease in the chest, abdomen, or Pelvis. STABLE. Continue surveillance; HOLD off any treatments.    #History of local recurrent bladder ca < 1cm agree with TURBT [march 2022] [Dr.Stoiof-cystoscopy NOV 2022]- s/p evaluation with urology.  Continue follow-up with urology.  # Pulmonary: SEP 2022- CT-emphysema /bronchial thickening /bilateral basilar groundglass opacities -?  Resolving  BOOP/ fibrosis Vs. fungal infection [s/p itraconazole]. CT AUG 2023--STABLE.     # Iatrogenic hypothyroidism-; TSH FEB 2023- TSH 10-currently on on Synthroid; 100 mcg wll repeat today-awaiting labs today.  # Anemia sec to CKD-III-IV/ on Iv iron.Hb-11-12-Clinically- STABLE; Hold Venofer today.  # CKD stageIII-IVGFR-31 -Clinically  STABLE ; recommend increase fluid intake.  #Weight loss improving; continue prednisone for appetite/also above pneumonitis- 72m /day.   # IV access: NO port/PIV  mychart-Thyroid  # DISPOSITION:  # HOLD venofer  # Follow-up in 3 months-;MD; cbc/cmp;iron studies;ferritin;  thyoid panel ;possible venofer;  Dr.B  # I reviewed the blood work- with the patient in detail; also reviewed the imaging independently [as summarized above]; and with the patient in detail.         All questions were answered. The patient knows to call the clinic with any problems, questions or concerns.    GCammie Sickle MD 01/24/2022 3:15 PM

## 2022-01-25 LAB — THYROID PANEL WITH TSH
Free Thyroxine Index: 1.5 (ref 1.2–4.9)
T3 Uptake Ratio: 27 % (ref 24–39)
T4, Total: 5.5 ug/dL (ref 4.5–12.0)
TSH: 0.645 u[IU]/mL (ref 0.450–4.500)

## 2022-01-26 ENCOUNTER — Other Ambulatory Visit: Payer: Self-pay | Admitting: Internal Medicine

## 2022-01-27 ENCOUNTER — Encounter: Payer: Self-pay | Admitting: Internal Medicine

## 2022-01-28 ENCOUNTER — Encounter: Payer: Self-pay | Admitting: Internal Medicine

## 2022-02-15 ENCOUNTER — Ambulatory Visit: Payer: Self-pay

## 2022-02-15 NOTE — Patient Instructions (Signed)
Visit Information  Thank you for taking time to visit with me today. Please don't hesitate to contact me if I can be of assistance to you.   Following are the goals we discussed today:   Goals Addressed             This Visit's Progress    Patient Stated:  Maintain and manage  my health       Care Coordination Interventions: Evaluation of current treatment plan related to hypertension self management and patient's adherence to plan as established by provider Reviewed medications with patient and discussed importance of compliance Discussed plans with patient for ongoing care management follow up and provided patient with direct contact information for care management team Reviewed scheduled/upcoming provider appointments including:  Discussed importance of following low sodium diet.             Our next appointment is by telephone on 04/04/22 at 11:00 am  Please call the care guide team at [phone] if you need to cancel or reschedule your appointment.   If you are experiencing a Mental Health or [place] or need someone to talk to, please call 1-800-273-TALK (toll free, 24 hour hotline)  Patient verbalizes understanding of instructions and care plan provided today and agrees to view in [place]. Active MyChart status and patient understanding of how to access instructions and care plan via MyChart confirmed with patient.     Quinn Plowman RN,BSN,CCM RN Care Manager Coordinator [phone]

## 2022-02-15 NOTE — Patient Outreach (Signed)
  Care Coordination   Follow Up Visit Note   02/15/2022 Name: Edward Cummings MRN: [id number] DOB: [birth year]  Edward Cummings is a 88 y.o. year old male who sees [name], MD for primary care. I spoke with  [name] by phone today.  What matters to the patients health and wellness today?  Patient states he continues to check his blood pressure and record.  Reports most recent blood pressure reading was 140/60.  He states he will take his recorded blood pressure readings to his primary provider appointment on 03/23/22.  Patient states his appetite is good. He reports weight at 130 lbs. Denies any falls.    Goals Addressed             This Visit's Progress    Patient Stated:  Maintain and manage  my health       Care Coordination Interventions: Evaluation of current treatment plan related to hypertension self management and patient's adherence to plan as established by provider Reviewed medications with patient and discussed importance of compliance Discussed plans with patient for ongoing care management follow up and provided patient with direct contact information for care management team Reviewed scheduled/upcoming provider appointments including:  Discussed importance of following low sodium diet.             SDOH assessments and interventions completed:  Yes  SDOH Interventions Today    Flowsheet Row Most Recent Value  SDOH Interventions   Food Insecurity Interventions Intervention Not Indicated  Housing Interventions Intervention Not Indicated  Transportation Interventions Intervention Not Indicated        Care Coordination Interventions Activated:  Yes  Care Coordination Interventions:  Yes, provided   Follow up plan: Follow up call scheduled for 04/04/22 at 11:00 am    Encounter Outcome:  Pt. Visit Completed   Quinn Plowman RN,BSN,CCM [place] Coordinator [phone]

## 2022-02-27 ENCOUNTER — Other Ambulatory Visit: Payer: Self-pay | Admitting: Internal Medicine

## 2022-02-27 NOTE — Telephone Encounter (Signed)
Component Ref Range & Units 1 mo ago (01/24/22) 4 mo ago (10/20/21) 7 mo ago (07/20/21) 1 yr ago (02/16/21) 1 yr ago (07/01/20) 1 yr ago (04/15/20) 2 yr ago (02/05/20)  TSH 0.450 - 4.500 uIU/mL 0.645  0.094 Low   10.008 High  R, CM  3.116 R, CM  1.695 R, CM  0.416 R, CM  0.489 R, CM   T4, Total 4.5 - 12.0 ug/dL 5.5  7.9        T3 Uptake Ratio 24 - 39 % 27  31        Free Thyroxine Index 1.2 - 4.9 1.5  2.4 CM        Comment: (NOTE)  Performed At: [facility]  [address] [place], [place] [id number]  Rush Farmer MD [phone]   Resulting Agency  [facility]         Specimen Collected: 01/24/22 12:09 Last Resulted: 01/25/22 06:37

## 2022-02-27 NOTE — Telephone Encounter (Signed)
Patient reports that current dose is 75 mcg when asked what his current dose is

## 2022-03-03 ENCOUNTER — Ambulatory Visit: Payer: Medicare HMO | Admitting: *Deleted

## 2022-03-13 ENCOUNTER — Other Ambulatory Visit: Payer: Self-pay | Admitting: Internal Medicine

## 2022-03-20 ENCOUNTER — Other Ambulatory Visit: Payer: Self-pay | Admitting: Internal Medicine

## 2022-03-21 ENCOUNTER — Encounter: Payer: Self-pay | Admitting: Internal Medicine

## 2022-03-23 ENCOUNTER — Encounter: Payer: Self-pay | Admitting: Nurse Practitioner

## 2022-03-23 ENCOUNTER — Ambulatory Visit (INDEPENDENT_AMBULATORY_CARE_PROVIDER_SITE_OTHER): Payer: Medicare HMO | Admitting: Nurse Practitioner

## 2022-03-23 VITALS — BP 142/80 | HR 64 | Ht 68.0 in | Wt 133.1 lb

## 2022-03-23 DIAGNOSIS — Z23 Encounter for immunization: Secondary | ICD-10-CM

## 2022-03-23 DIAGNOSIS — I10 Essential (primary) hypertension: Secondary | ICD-10-CM

## 2022-03-23 NOTE — Progress Notes (Signed)
Established Patient Office Visit  Subjective:  Patient ID: Edward Cummings, male    DOB: [birth year]  Age: 88 y.o. MRN: [id number]  CC:  Chief Complaint  Patient presents with   Follow-up     HPI  Edward Cummings presents for follow up on hypertension. His average BP at home remains between 144/65. He complains of leg cramps off and on from 2 months. He has gained 2 lb since his last visit.  HPI   Past Medical History:  Diagnosis Date   Anemia    Anxiety 12/31/2019   Cancer [facility])    bladder   Chronic kidney disease    Depression    Hypertension    Neuromuscular disorder ([place])    Nerve damage to left face/eye since around 2002.    Past Surgical History:  Procedure Laterality Date   CYSTOSCOPY W/ RETROGRADES Bilateral 01/25/2018   Procedure: CYSTOSCOPY WITH RETROGRADE PYELOGRAM;  [name], MD;  [name];  Service: Urology;  Laterality: Bilateral;   CYSTOSCOPY W/ URETERAL STENT PLACEMENT Bilateral 01/06/2019   Procedure: CYSTOSCOPY WITH RETROGRADE PYELOGRAM/URETERAL STENT REMOVAL;  [name], MD;  [name];  Service: Urology;  Laterality: Bilateral;   CYSTOSCOPY WITH BIOPSY N/A 09/21/2020   Procedure: CYSTOSCOPY WITH BLADDER BIOPSY;  [name], MD;  [name];  Service: Urology;  [name];   CYSTOSCOPY WITH FULGERATION N/A 09/21/2020   Procedure: CYSTOSCOPY WITH FULGERATION;  [name], MD;  [name];  Service: Urology;  [name];   CYSTOSCOPY WITH STENT PLACEMENT Bilateral 01/25/2018   Procedure: CYSTOSCOPY WITH STENT PLACEMENT;  [name], MD;  [name];  Service: Urology;  Laterality: Bilateral;   DORSAL SLIT N/A 01/06/2019   Procedure: DORSAL SLIT;  [name], MD;  [name];  Service: Urology;  [name];   ESOPHAGOGASTRODUODENOSCOPY (EGD) WITH PROPOFOL N/A 11/12/2019   Procedure:  ESOPHAGOGASTRODUODENOSCOPY (EGD) WITH PROPOFOL;  Surgeon: Virgel Manifold, MD;  Location: [facility];  Service: Endoscopy;  [name];   EYE SURGERY     Cornea transplants bilaterally & cataract surgery.   TRANSURETHRAL RESECTION OF BLADDER TUMOR N/A 01/25/2018   Procedure: TRANSURETHRAL RESECTION OF BLADDER TUMOR (TURBT);  [name], MD;  [name];  Service: Urology;  [name];    Family History  Problem Relation Age of Onset   Prostate cancer Neg Hx    Kidney cancer Neg Hx    Bladder Cancer Neg Hx     Social History   Socioeconomic History   Marital status: Widowed    Spouse name: [name]   Number of children: 2   Years of education: 6th grade   Highest education level: 6th grade  Occupational History   Not on file  Tobacco Use   Smoking status: Former    Types: Cigarettes    Quit date: 2010    Years since quitting: 13.8   Smokeless tobacco: Current    Types: Chew   Tobacco comments:    Stopped approximately 10 years ago.  Vaping Use   Vaping Use: Never used  Substance and Sexual Activity   Alcohol use: Yes    Alcohol/week: 2.0 standard drinks of alcohol    Types: 2 Cans of beer per week    Comment: occasionally   Drug use: Never   Sexual activity: Not Currently    Birth control/protection: None  Other Topics Concern  Not on file  Social History Narrative    lives in [place]; with oldest son. Wife died in 03-Sep-2018.  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 Pt still drives and is indepent with no serious chronic disease. Worked in [place] Strain: Low Risk  (06/23/2021)   Overall Financial Resource Strain (CARDIA)    Difficulty of Paying Living Expenses: Not very hard  Food Insecurity: No Food Insecurity (02/15/2022)   Hunger Vital Sign    Worried About Running Out of Food in the Last Year: Never true     Ran Out of Food in the Last Year: Never true  Transportation Needs: No Transportation Needs (02/15/2022)   PRAPARE - Hydrologist (Medical): No    Lack of Transportation (Non-Medical): No  Physical Activity: Insufficiently Active (05/27/2021)   Exercise Vital Sign    Days of Exercise per Week: 5 days    Minutes of Exercise per Session: 10 min  Stress: No Stress Concern Present (05/27/2021)   [place]    Feeling of Stress : Only a little  Social Connections: Unknown (05/27/2021)   Social Connection and Isolation Panel [NHANES]    Frequency of Communication with Friends and Family: More than three times a week    Frequency of Social Gatherings with Friends and Family: Three times a week    Attends Religious Services: Not on file    Active Member of Clubs or Organizations: Not on file    Attends Club or Organization Meetings: Not on file    Marital Status: Widowed  Intimate Partner Violence: Not At Risk (05/27/2021)   Humiliation, Afraid, Rape, and Kick questionnaire    Fear of Current or Ex-Partner: No    Emotionally Abused: No    Physically Abused: No    Sexually Abused: No     Outpatient Medications Prior to Visit  Medication Sig Dispense Refill   amLODipine (NORVASC) 10 MG tablet Take 10 mg by mouth daily.     aspirin EC 81 MG tablet Take 81 mg by mouth daily.     levothyroxine (SYNTHROID) 75 MCG tablet TAKE 1 TABLET BY MOUTH ONCE A DAY BEFOREBREAKFAST. 30 tablet 3   loratadine (CLARITIN) 10 MG tablet TAKE 1 TABLET BY MOUTH ONCE A DAY 30 tablet 11   mometasone (NASONEX) 50 MCG/ACT nasal spray PLACE 2 SPRAYS INTO THE NOSE DAILY 17 g 3   Multiple Vitamin (MULTIVITAMIN WITH MINERALS) TABS tablet Take 1 tablet by mouth daily.      prednisoLONE acetate (PRED FORTE) 1 % ophthalmic suspension Place 1 drop into both eyes daily at 2 PM. 5 mL 4   predniSONE (DELTASONE) 5 MG tablet TAKE 1 TABLET  BY MOUTH ONCE A DAY WITH BREAKFAST. 60 tablet 0   No facility-administered medications prior to visit.    No Known Allergies  ROS Review of Systems  Constitutional:  Negative for activity change, chills and fever.  HENT:  Negative for congestion, ear pain, postnasal drip and sore throat.   Eyes:  Negative for discharge and redness.  Respiratory:  Negative for apnea and shortness of breath.   Cardiovascular:  Negative for chest pain and palpitations.  Gastrointestinal:  Negative for abdominal distention, blood in stool and nausea.  Genitourinary:  Negative for difficulty urinating, flank pain and hematuria.  Musculoskeletal:  Positive for back pain.  Negative for arthralgias and neck pain.  Skin:  Negative for color change and rash.  Neurological:  Negative for dizziness, numbness and headaches.  Psychiatric/Behavioral:  Negative for agitation, behavioral problems and confusion.       Objective:    Physical Exam Constitutional:      Appearance: Normal appearance. He is normal weight.  HENT:     Head: Normocephalic and atraumatic.     Right Ear: Tympanic membrane normal.     Left Ear: Tympanic membrane normal.     Nose: Nose normal.     Mouth/Throat:     Mouth: Mucous membranes are moist.     Dentition: No gum lesions (edentulous).     Tonsils: No tonsillar exudate.  Eyes:     Extraocular Movements: Extraocular movements intact.     Conjunctiva/sclera: Conjunctivae normal.     Pupils: Pupils are equal, round, and reactive to light.     Comments: Dropping on left eyelid  Cardiovascular:     Rate and Rhythm: Normal rate and regular rhythm.     Pulses: Normal pulses.     Heart sounds: Normal heart sounds.  Pulmonary:     Effort: Pulmonary effort is normal.     Breath sounds: Normal breath sounds.  Abdominal:     General: Abdomen is flat. Bowel sounds are normal.     Palpations: Abdomen is soft.  Musculoskeletal:        General: Normal range of motion.     Cervical  back: Normal range of motion.  Skin:    General: Skin is warm.     Capillary Refill: Capillary refill takes less than 2 seconds.  Neurological:     General: No focal deficit present.     Mental Status: He is alert and oriented to person, place, and time. Mental status is at baseline.  Psychiatric:        Mood and Affect: Mood normal.        Behavior: Behavior normal.        Thought Content: Thought content normal.        Judgment: Judgment normal.     BP (!) 142/80   Pulse 64   Ht '5\' 8"'$  (1.727 m)   Wt 133 lb 1.6 oz (60.4 kg)   BMI 20.24 kg/m  Wt Readings from Last 3 Encounters:  03/23/22 133 lb 1.6 oz (60.4 kg)  01/24/22 131 lb (59.4 kg)  01/20/22 129 lb 8 oz (58.7 kg)     Health Maintenance Due  Topic Date Due   TETANUS/TDAP  Never done    There are no preventive care reminders to display for this patient.  Lab Results  Component Value Date   TSH 0.645 01/24/2022   Lab Results  Component Value Date   WBC 10.3 01/24/2022   HGB 11.1 (L) 01/24/2022   HCT 34.1 (L) 01/24/2022   MCV 98.0 01/24/2022   PLT 248 01/24/2022   Lab Results  Component Value Date   NA 137 01/24/2022   K 5.0 01/24/2022   CO2 23 01/24/2022   GLUCOSE 92 01/24/2022   BUN 25 (H) 01/24/2022   CREATININE 1.99 (H) 01/24/2022   BILITOT 0.6 01/24/2022   ALKPHOS 85 01/24/2022   AST 23 01/24/2022   ALT 19 01/24/2022   PROT 7.4 01/24/2022   ALBUMIN 3.9 01/24/2022   CALCIUM 9.2 01/24/2022   ANIONGAP 7 01/24/2022   No results found for: "CHOL" No results found for: "HDL" No results found for: "LDLCALC" No results  found for: "TRIG" No results found for: "CHOLHDL" Lab Results  Component Value Date   HGBA1C 4.5 (L) 04/13/2018      Assessment & Plan:   Problem List Items Addressed This Visit       Cardiovascular and Mediastinum   Hypertension    Patient BP 162/74 and 142/80  in the office today. Advised pt to follow a low sodium and heart healthy diet. Advised patient to check blood  pressure at home and bring readings to the next appointment. Continue the current medication.        Other Visit Diagnoses     Need for influenza vaccination    -  Primary   Relevant Orders   Flu Vaccine QUAD High Dose(Fluad) (Completed)      No orders of the defined types were placed in this encounter.   Follow-up: No follow-ups on file.    Theresia Lo, NP

## 2022-04-04 ENCOUNTER — Ambulatory Visit: Payer: Self-pay

## 2022-04-04 NOTE — Patient Outreach (Signed)
  Care Coordination   Follow Up Visit Note   04/04/2022 Name: Edward Cummings MRN: [id number] DOB: 21-Jun-1932  Edward Cummings is a 88 y.o. year old male who sees [name], MD for primary care. I spoke with  [name] by phone today.  What matters to the patients health and wellness today?  Patient states he is doing well.  He reports having follow up visit with pcp on 03/23/22 and receiving flu shot.  Patient states he continues to monitor his blood pressure daily and record. He states he takes a 30 record of his blood pressure readings to his provider appointments.  Patient states his weight has been stable at 132 lbs.   Patient states his appetite is good and he denies any falls. He states he is aware he needs to watch his salt intake.   Goals reviewed.  Patient in agreement that goals have been met and therefore will be closed to care coordination services.  Advised patient to call RNCM or PCP if care coordination services are needed in the future.      Goals Addressed             This Visit's Progress    COMPLETED: Patient Stated:  Maintain and manage  my health       Care Coordination Interventions: Evaluation of current treatment plan related to hypertension self management and patient's adherence to plan as established by provider Reviewed medications with patient and discussed importance of compliance Discussed with patient importance of ongoing blood pressure monitoring and recording. Advised to notify provider for blood pressure readings outside of set parameters Advised to continue to weigh daily.  Reviewed scheduled/upcoming provider appointments  Discussed importance of following low sodium diet.             SDOH assessments and interventions completed:  No     Care Coordination Interventions Activated:  Yes  Care Coordination Interventions:  Yes, provided   Follow up plan: No further intervention required.   Encounter Outcome:  Pt. Visit Completed    Quinn Plowman RN,BSN,CCM [place] [phone] direct line

## 2022-04-06 NOTE — Assessment & Plan Note (Addendum)
Patient BP 162/74 and 142/80  in the office today. Advised pt to follow a low sodium and heart healthy diet. Advised patient to check blood pressure at home and bring readings to the next appointment. Continue the current medication.

## 2022-04-18 ENCOUNTER — Other Ambulatory Visit: Payer: Self-pay | Admitting: Internal Medicine

## 2022-04-19 NOTE — Telephone Encounter (Signed)
Last office visit 8/15 Weight loss improving; continue prednisone for appetite/also above pneumonitis- '5mg'$  /day  Next office visit 11/28  Note from pharmacy on rx request:  PT CLAIMS HE IS TO TAKE 2 TIMES DAILY. NEED NEW RX FOR 2 TIMES DAILY IF HE IS CORRECT

## 2022-04-26 ENCOUNTER — Ambulatory Visit: Payer: Medicare HMO

## 2022-04-26 ENCOUNTER — Ambulatory Visit: Payer: Medicare HMO | Admitting: Internal Medicine

## 2022-04-26 ENCOUNTER — Other Ambulatory Visit: Payer: Medicare HMO

## 2022-05-08 ENCOUNTER — Encounter: Payer: Self-pay | Admitting: Urology

## 2022-05-08 ENCOUNTER — Ambulatory Visit: Payer: Medicare HMO | Admitting: Urology

## 2022-05-08 VITALS — BP 168/69 | HR 88 | Ht 66.0 in | Wt 133.0 lb

## 2022-05-08 DIAGNOSIS — C7910 Secondary malignant neoplasm of unspecified urinary organs: Secondary | ICD-10-CM

## 2022-05-08 DIAGNOSIS — Z8551 Personal history of malignant neoplasm of bladder: Secondary | ICD-10-CM

## 2022-05-08 DIAGNOSIS — D494 Neoplasm of unspecified behavior of bladder: Secondary | ICD-10-CM | POA: Diagnosis not present

## 2022-05-08 LAB — URINALYSIS, COMPLETE
Bilirubin, UA: NEGATIVE
Glucose, UA: NEGATIVE
Ketones, UA: NEGATIVE
Leukocytes,UA: NEGATIVE
Nitrite, UA: NEGATIVE
RBC, UA: NEGATIVE
Specific Gravity, UA: 1.01 (ref 1.005–1.030)
Urobilinogen, Ur: 0.2 mg/dL (ref 0.2–1.0)
pH, UA: 5.5 (ref 5.0–7.5)

## 2022-05-08 LAB — MICROSCOPIC EXAMINATION: Bacteria, UA: NONE SEEN

## 2022-05-08 NOTE — Progress Notes (Signed)
   05/08/22  CC:  Chief Complaint  Patient presents with   Cysto    Indications: History of urothelial carcinoma the bladder  status post TURBT 01/25/2018 for a 4 cm bladder mass and bilateral hydronephrosis.  He was noted to have moderate right hydronephrosis and left hydronephrosis and had bilateral stents placed.   He was subsequently found to have metastatic disease to the retroperitoneal lymph nodes and bulky retroperitoneal adenopathy as well as mediastinal uptake and uptake in the right pubic rami.  Treated with palliative therapy and oncology and did not follow-up with urology until 12/2018.   Cystoscopy with bilateral retrograde pyelograms showed no persistent hydronephrosis and stents were removed.  No evidence of urothelial recurrence Underwent cystoscopy with bladder biopsies 09/2020 for mucosal erythema with pathology returning reactive lymphoid aggregates with inflammatory changes.  HPI: Mr. Jolliff has no complaints today.  He denies gross hematuria.  CT chest/abdomen/pelvis ordered by oncology August 2023 showed no definite evidence of metastatic disease  Blood pressure (!) 157/80, pulse (!) 105, height '5\' 8"'$  (1.727 m), weight 131 lb (59.4 kg). NED. A&Ox3.   No respiratory distress   Abd soft, NT, ND Normal phallus with bilateral descended testicles  Cystoscopy Procedure Note  Patient identification was confirmed, informed consent was obtained, and patient was prepped using Betadine solution.  Lidocaine jelly was administered per urethral meatus.     Pre-Procedure: - Inspection reveals a normal caliber urethral meatus.  Procedure: The flexible cystoscope was introduced without difficulty - No urethral strictures/lesions are present. -  Moderate lateral lobe enlargement  prostate  -  Moderate elevation  bladder neck - Bilateral ureteral orifices identified; right resected - Bladder mucosa  reveals no ulcers, tumors, or lesions - No bladder stones - No  trabeculation  Retroflexion shows no abnormalities   Post-Procedure: - Patient tolerated the procedure well  Assessment/ Plan: No mucosal abnormalities noted Surveillance cystoscopy 6 months   Abbie Sons, MD

## 2022-05-09 ENCOUNTER — Inpatient Hospital Stay: Payer: Medicare HMO | Attending: Internal Medicine

## 2022-05-09 ENCOUNTER — Encounter: Payer: Self-pay | Admitting: Internal Medicine

## 2022-05-09 ENCOUNTER — Inpatient Hospital Stay: Payer: Medicare HMO

## 2022-05-09 ENCOUNTER — Inpatient Hospital Stay: Payer: Medicare HMO | Admitting: Internal Medicine

## 2022-05-09 VITALS — BP 156/80 | HR 79 | Temp 97.8°F | Resp 16

## 2022-05-09 DIAGNOSIS — C678 Malignant neoplasm of overlapping sites of bladder: Secondary | ICD-10-CM

## 2022-05-09 DIAGNOSIS — C772 Secondary and unspecified malignant neoplasm of intra-abdominal lymph nodes: Secondary | ICD-10-CM | POA: Insufficient documentation

## 2022-05-09 DIAGNOSIS — Z87891 Personal history of nicotine dependence: Secondary | ICD-10-CM | POA: Diagnosis not present

## 2022-05-09 DIAGNOSIS — Z7952 Long term (current) use of systemic steroids: Secondary | ICD-10-CM | POA: Insufficient documentation

## 2022-05-09 DIAGNOSIS — N183 Chronic kidney disease, stage 3 unspecified: Secondary | ICD-10-CM | POA: Diagnosis not present

## 2022-05-09 DIAGNOSIS — D631 Anemia in chronic kidney disease: Secondary | ICD-10-CM | POA: Diagnosis not present

## 2022-05-09 DIAGNOSIS — I129 Hypertensive chronic kidney disease with stage 1 through stage 4 chronic kidney disease, or unspecified chronic kidney disease: Secondary | ICD-10-CM | POA: Diagnosis not present

## 2022-05-09 DIAGNOSIS — Z7951 Long term (current) use of inhaled steroids: Secondary | ICD-10-CM | POA: Diagnosis not present

## 2022-05-09 DIAGNOSIS — E032 Hypothyroidism due to medicaments and other exogenous substances: Secondary | ICD-10-CM | POA: Insufficient documentation

## 2022-05-09 DIAGNOSIS — Z7982 Long term (current) use of aspirin: Secondary | ICD-10-CM | POA: Insufficient documentation

## 2022-05-09 DIAGNOSIS — Z79899 Other long term (current) drug therapy: Secondary | ICD-10-CM | POA: Diagnosis not present

## 2022-05-09 LAB — CBC WITH DIFFERENTIAL/PLATELET
Abs Immature Granulocytes: 0.07 10*3/uL (ref 0.00–0.07)
Basophils Absolute: 0.1 10*3/uL (ref 0.0–0.1)
Basophils Relative: 1 %
Eosinophils Absolute: 0.1 10*3/uL (ref 0.0–0.5)
Eosinophils Relative: 1 %
HCT: 35.4 % — ABNORMAL LOW (ref 39.0–52.0)
Hemoglobin: 11.6 g/dL — ABNORMAL LOW (ref 13.0–17.0)
Immature Granulocytes: 1 %
Lymphocytes Relative: 6 %
Lymphs Abs: 0.8 10*3/uL (ref 0.7–4.0)
MCH: 32.8 pg (ref 26.0–34.0)
MCHC: 32.8 g/dL (ref 30.0–36.0)
MCV: 100 fL (ref 80.0–100.0)
Monocytes Absolute: 0.3 10*3/uL (ref 0.1–1.0)
Monocytes Relative: 3 %
Neutro Abs: 11 10*3/uL — ABNORMAL HIGH (ref 1.7–7.7)
Neutrophils Relative %: 88 %
Platelets: 234 10*3/uL (ref 150–400)
RBC: 3.54 MIL/uL — ABNORMAL LOW (ref 4.22–5.81)
RDW: 13 % (ref 11.5–15.5)
WBC: 12.3 10*3/uL — ABNORMAL HIGH (ref 4.0–10.5)
nRBC: 0 % (ref 0.0–0.2)

## 2022-05-09 LAB — COMPREHENSIVE METABOLIC PANEL
ALT: 31 U/L (ref 0–44)
AST: 33 U/L (ref 15–41)
Albumin: 4 g/dL (ref 3.5–5.0)
Alkaline Phosphatase: 60 U/L (ref 38–126)
Anion gap: 6 (ref 5–15)
BUN: 39 mg/dL — ABNORMAL HIGH (ref 8–23)
CO2: 23 mmol/L (ref 22–32)
Calcium: 9.3 mg/dL (ref 8.9–10.3)
Chloride: 107 mmol/L (ref 98–111)
Creatinine, Ser: 2.47 mg/dL — ABNORMAL HIGH (ref 0.61–1.24)
GFR, Estimated: 25 mL/min — ABNORMAL LOW (ref >60)
Glucose, Bld: 150 mg/dL — ABNORMAL HIGH (ref 70–99)
Potassium: 5.1 mmol/L (ref 3.5–5.1)
Sodium: 136 mmol/L (ref 135–145)
Total Bilirubin: 0.5 mg/dL (ref 0.3–1.2)
Total Protein: 7.2 g/dL (ref 6.5–8.1)

## 2022-05-09 LAB — IRON AND TIBC
Iron: 99 ug/dL (ref 45–182)
Saturation Ratios: 35 % (ref 17.9–39.5)
TIBC: 283 ug/dL (ref 250–450)
UIBC: 184 ug/dL

## 2022-05-09 LAB — FERRITIN: Ferritin: 535 ng/mL — ABNORMAL HIGH (ref 24–336)

## 2022-05-09 NOTE — Progress Notes (Signed)
Patient denies new problems/concerns today.   °

## 2022-05-09 NOTE — Assessment & Plan Note (Addendum)
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  Stage IV- AUG 2023- No evidence for metastatic disease in the chest, abdomen, or Pelvis. STABLE. Continue surveillance; HOLD off any treatments.  Will repeat CT scan in 4 months.   #History of local recurrent bladder ca < 1cm agree with TURBT [march 2022] [Dr.Stoiof-cystoscopy NOV 2023]- s/p evaluation with urology.    # Pulmonary: SEP 2022- CT-emphysema /bronchial thickening /bilateral basilar groundglass opacities -?  Resolving  BOOP/ fibrosis Vs. fungal infection [s/p itraconazole]. CT AUG 2023--STABLE. Will repeat CT scan in 4 months.      # Iatrogenic hypothyroidism- on Synthroid; 100 mcg wll repeat today-awaiting labs today. STABLE.  # Anemia sec to CKD-III-IV/ on Iv iron.Hb-11-12-Clinically- STABLE; Hold Venofer today.  # CKD stageIII-IV GFR-24 -Clinically  STABLE ; recommend increase fluid intake.  #Weight loss improving; continue prednisone for appetite/also above pneumonitis- '5mg'$  /day.   # IV access: NO port/PIV  mychart-Thyroid  # DISPOSITION:  # HOLD venofer  # Follow-up in 4 months-;MD; cbc/cmp;iron studies;ferritin;  thyoid panel ;possible venofer;CT CAP prior-  Dr.B

## 2022-05-09 NOTE — Progress Notes (Unsigned)
[place] NOTE  Patient Care Team: [name], [name], [name], MD (Inactive) as Consulting Physician (Gastroenterology) Clyde Canterbury, MD as Referring Physician (Otolaryngology) Ottie Glazier, MD as Consulting Physician (Pulmonary Disease) Abbie Sons, MD (Urology)  CHIEF COMPLAINTS/PURPOSE OF CONSULTATION: Bladder cancer   Oncology History Overview Note  # AUG 2019-TRANSITIONAL CELL BLADDER CA [~ 4cm tumor] s/p cystoscopy [Dr.Stoiff]  with extensive angiolymphatic invasion; [name]; [name]; mediastinal uptake; right pubic rami uptake.  # [name]; Jan 18th 2021- switched to [place]- [pt preference; [name]; July 2020 cystoscopy-no evidence of bladder malignancy/Dr. Bernardo Heater- enlarged prostate [PSA- 0.95; 2021]  # Molecular testing- PDL-1 CPS- 20%; [name]  ;GOALS: palliative  CURRENT/MOST RECENT THERAPY:OPDIVO [C]     Cancer of overlapping sites of bladder ([place])  02/28/2018 - 06/09/2019 Chemotherapy   The patient had atezolizumab (TECENTRIQ) 1,[name], 1,200 mg, Intravenous, Once, [name],200 mg (02/28/2018), 1,200 mg (03/21/2018), 1,200 mg (04/11/2018), 1,200 mg (05/02/2018), 1,200 mg (06/13/2018), 1,200 mg (05/23/2018), 1,200 mg (07/04/2018), 1,200 mg (07/25/2018), 1,200 mg (08/15/2018), 1,200 mg (09/05/2018), 1,200 mg (10/25/2018), 1,200 mg (11/22/2018), 1,200 mg (12/20/2018), 1,200 mg (01/10/2019), 1,200 mg (01/31/2019), 1,200 mg (02/21/2019), 1,200 mg (03/14/2019), 1,200 mg  (04/04/2019), 1,200 mg (04/25/2019), 1,200 mg (05/16/2019), 1,200 mg (06/09/2019)  for chemotherapy treatment.    06/30/2019 - 06/17/2020 Chemotherapy   Patient is on Treatment Plan : BLADDER Nivolumab q14d      HISTORY OF PRESENTING ILLNESS: Patient ambulating independently.  Accompanied by his son.   [name] 88 y.o.  male with metastatic transitional carcinoma of the bladder most recently on Opdivo [currently on hold because of poor tolerance] is here for follow-up/CT scan.  Appetite is good.  No weight loss.  No nausea no vomiting.Continues to be on prednisone 5 mg a day.  No blood in stools or black-colored stools.  No new shortness of breath or cough.  Review of Systems  Constitutional:  Positive for malaise/fatigue. Negative for chills, diaphoresis and fever.  HENT:  Negative for nosebleeds and sore throat.   Eyes:  Negative for double vision.  Respiratory:  Negative for hemoptysis, sputum production and wheezing.   Cardiovascular:  Negative for chest pain, palpitations, orthopnea and leg swelling.  Gastrointestinal:  Negative for abdominal pain, blood in stool, diarrhea, heartburn, melena, nausea and vomiting.  Genitourinary:  Negative for dysuria.  Musculoskeletal:  Positive for back pain and joint pain.  Skin: Negative.  Negative for itching and rash.  Neurological:  Negative for tingling, focal weakness and headaches.  Endo/Heme/Allergies:  Does not bruise/bleed easily.  Psychiatric/Behavioral:  Negative for depression. The patient is not nervous/anxious and does not have insomnia.      MEDICAL HISTORY:  Past Medical History:  Diagnosis Date   Anemia    Anxiety 12/31/2019   Cancer [facility])    bladder   Chronic kidney disease    Depression    Hypertension    Neuromuscular disorder ([place])    Nerve damage to left face/eye since around 2002.    SURGICAL HISTORY:  Past Surgical History:  Procedure Laterality Date   CYSTOSCOPY W/ RETROGRADES Bilateral 01/25/2018    Procedure: CYSTOSCOPY WITH RETROGRADE PYELOGRAM;  [name], MD;  [name];  Service: Urology;  Laterality: Bilateral;   CYSTOSCOPY W/ URETERAL STENT PLACEMENT Bilateral 01/06/2019   Procedure: CYSTOSCOPY WITH RETROGRADE PYELOGRAM/URETERAL STENT REMOVAL;  [name], MD;  [name];  Service: Urology;  Laterality: Bilateral;   CYSTOSCOPY WITH BIOPSY N/A 09/21/2020   Procedure: CYSTOSCOPY WITH BLADDER BIOPSY;  [name], MD;  [name];  Service: Urology;  [name];   CYSTOSCOPY WITH FULGERATION N/A 09/21/2020   Procedure: CYSTOSCOPY WITH FULGERATION;  [name], MD;  [name];  Service: Urology;  [name];   CYSTOSCOPY WITH STENT PLACEMENT Bilateral 01/25/2018   Procedure: CYSTOSCOPY WITH STENT PLACEMENT;  [name], MD;  [name];  Service: Urology;  Laterality: Bilateral;   DORSAL SLIT N/A 01/06/2019   Procedure: DORSAL SLIT;  [name], MD;  [name];  Service: Urology;  [name];   ESOPHAGOGASTRODUODENOSCOPY (EGD) WITH PROPOFOL N/A 11/12/2019   Procedure: ESOPHAGOGASTRODUODENOSCOPY (EGD) WITH PROPOFOL;  Surgeon: Virgel Manifold, MD;  Location: [facility];  Service: Endoscopy;  [name];   EYE SURGERY     Cornea transplants bilaterally & cataract surgery.   TRANSURETHRAL RESECTION OF BLADDER TUMOR N/A 01/25/2018   Procedure: TRANSURETHRAL RESECTION OF BLADDER TUMOR (TURBT);  [name], MD;  [name];  Service: Urology;  [name];    SOCIAL HISTORY: Social History   Socioeconomic History   Marital status: Widowed    Spouse name: [name]   Number of children: 2   Years of education: 6th grade   Highest education level: 6th grade  Occupational History   Not on file  Tobacco Use   Smoking status: Former    Types: Cigarettes    Quit date: 08-19-08    Years since quitting: 13.9    Smokeless tobacco: Current    Types: Chew   Tobacco comments:    Stopped approximately 10 years ago.  Vaping Use   Vaping Use: Never used  Substance and Sexual Activity   Alcohol use: Yes    Alcohol/week: 2.0 standard drinks of alcohol    Types: 2 Cans of beer per week    Comment: occasionally   Drug use: Never   Sexual activity: Not Currently    Birth control/protection: None  Other Topics Concern   Not on file  Social History Narrative    lives in [place]; with oldest son. Wife died in 2018/08/19.  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 Pt still drives and is indepent with no serious chronic disease. Worked in [place] Strain: Low Risk  (06/23/2021)   Overall Financial Resource Strain (CARDIA)    Difficulty of Paying Living Expenses: Not very hard  Food Insecurity: No Food Insecurity (02/15/2022)   Hunger Vital Sign    Worried About Running Out of Food in the Last Year: Never true    Ran Out of Food in the Last Year: Never true  Transportation Needs: No Transportation Needs (02/15/2022)   PRAPARE - Hydrologist (Medical): No    Lack of Transportation (Non-Medical): No  Physical Activity: Insufficiently Active (05/27/2021)   Exercise Vital Sign  Days of Exercise per Week: 5 days    Minutes of Exercise per Session: 10 min  Stress: No Stress Concern Present (05/27/2021)   [place]    Feeling of Stress : Only a little  Social Connections: Unknown (05/27/2021)   Social Connection and Isolation Panel [NHANES]    Frequency of Communication with Friends and Family: More than three times a week    Frequency of Social Gatherings with Friends and Family: Three times a week    Attends Religious Services: Not on file    Active Member of Clubs or Organizations: Not on file     Attends Club or Organization Meetings: Not on file    Marital Status: Widowed  Intimate Partner Violence: Not At Risk (05/27/2021)   Humiliation, Afraid, Rape, and Kick questionnaire    Fear of Current or Ex-Partner: No    Emotionally Abused: No    Physically Abused: No    Sexually Abused: No    FAMILY HISTORY: Family History  Problem Relation Age of Onset   Prostate cancer Neg Hx    Kidney cancer Neg Hx    Bladder Cancer Neg Hx     ALLERGIES:  has No Known Allergies.  MEDICATIONS:  Current Outpatient Medications  Medication Sig Dispense Refill   amLODipine (NORVASC) 10 MG tablet Take 10 mg by mouth daily.     aspirin EC 81 MG tablet Take 81 mg by mouth daily.     levothyroxine (SYNTHROID) 75 MCG tablet TAKE 1 TABLET BY MOUTH ONCE A DAY BEFOREBREAKFAST. 30 tablet 3   loratadine (CLARITIN) 10 MG tablet TAKE 1 TABLET BY MOUTH ONCE A DAY 30 tablet 11   mometasone (NASONEX) 50 MCG/ACT nasal spray PLACE 2 SPRAYS INTO THE NOSE DAILY 17 g 3   Multiple Vitamin (MULTIVITAMIN WITH MINERALS) TABS tablet Take 1 tablet by mouth daily.      prednisoLONE acetate (PRED FORTE) 1 % ophthalmic suspension Place 1 drop into both eyes daily at 2 PM. 5 mL 4   predniSONE (DELTASONE) 5 MG tablet TAKE 1 TABLET BY MOUTH ONCE A DAY WITH BREAKFAST. 60 tablet 0   No current facility-administered medications for this visit.      Marland Kitchen  PHYSICAL EXAMINATION: ECOG PERFORMANCE STATUS: 1 - Symptomatic but completely ambulatory  There were no vitals filed for this visit.  There were no vitals filed for this visit.   Physical Exam HENT:     Head: Normocephalic and atraumatic.     Mouth/Throat:     Pharynx: No oropharyngeal exudate.  Eyes:     Pupils: Pupils are equal, round, and reactive to light.     Comments: Chronic drooping of the left eyelid.  Cardiovascular:     Rate and Rhythm: Normal rate and regular rhythm.  Pulmonary:     Effort: No respiratory distress.     Breath sounds: No wheezing.   Abdominal:     General: Bowel sounds are normal. There is no distension.     Palpations: Abdomen is soft. There is no mass.     Tenderness: There is no abdominal tenderness. There is no guarding or rebound.  Musculoskeletal:        General: No tenderness. Normal range of motion.     Cervical back: Normal range of motion and neck supple.  Skin:    General: Skin is warm.  Neurological:     Mental Status: He is alert and oriented to person, place, and time.  Psychiatric:        Mood and Affect: Affect normal.      LABORATORY DATA:  I have reviewed the data as listed Lab Results  Component Value Date   WBC 10.3 01/24/2022   HGB 11.1 (L) 01/24/2022   HCT 34.1 (L) 01/24/2022   MCV 98.0 01/24/2022   PLT 248 01/24/2022   Recent Labs    07/20/21 0944 10/20/21 1030 01/24/22 1209  NA 138 135 137  K 4.4 4.4 5.0  CL 106 108 107  CO2 _0 GLUCOSE 92 95 92  BUN 32* 32* 25*  CREATININE 2.54* 2.26* 1.99*  CALCIUM 9.1 8.9 9.2  GFRNONAA 24* 27* 32*  PROT 7.1 6.6 7.4  ALBUMIN 3.7 3.7 3.9  AST _1 ALT _2 ALKPHOS 73 60 85  BILITOT 0.6 0.6 0.6    RADIOGRAPHIC STUDIES: I have personally reviewed the radiological images as listed and agreed with the findings in the report. No results found.   ASSESSMENT & PLAN:   Cancer of overlapping sites of bladder ([place]) # High-grade transitional cell carcinoma of the bladder metastatic to retroperitoneal lymph node.  Stage IV- AUG 2023- No evidence for metastatic disease in the chest, abdomen, or Pelvis. STABLE. Continue surveillance; HOLD off any treatments.    #History of local recurrent bladder ca < 1cm agree with TURBT [march 2022] [Dr.Stoiof-cystoscopy NOV 2022]- s/p evaluation with urology.  Continue follow-up with urology.  # Pulmonary: SEP 2022- CT-emphysema /bronchial thickening /bilateral basilar groundglass opacities -?  Resolving  BOOP/ fibrosis Vs. fungal infection [s/p itraconazole]. CT AUG 2023--STABLE.      # Iatrogenic hypothyroidism-; TSH FEB 2023- TSH 10-currently on on Synthroid; 100 mcg wll repeat today-awaiting labs today.  # Anemia sec to CKD-III-IV/ on Iv iron.Hb-11-12-Clinically- STABLE; Hold Venofer today.  # CKD stageIII-IVGFR-31 -Clinically  STABLE ; recommend increase fluid intake.  #Weight loss improving; continue prednisone for appetite/also above pneumonitis- 108m /day.   # IV access: NO port/PIV  mychart-Thyroid  # DISPOSITION:  # HOLD venofer  # Follow-up in 3 months-;MD; cbc/cmp;iron studies;ferritin;  thyoid panel ;possible venofer;  Dr.B  # I reviewed the blood work- with the patient in detail; also reviewed the imaging independently [as summarized above]; and with the patient in detail.         All questions were answered. The patient knows to call the clinic with any problems, questions or concerns.    GCammie Sickle MD 05/09/2022 12:30 PM

## 2022-05-10 ENCOUNTER — Encounter: Payer: Self-pay | Admitting: Internal Medicine

## 2022-05-10 LAB — THYROID PANEL WITH TSH
Free Thyroxine Index: 1.4 (ref 1.2–4.9)
T3 Uptake Ratio: 25 % (ref 24–39)
T4, Total: 5.5 ug/dL (ref 4.5–12.0)
TSH: 2.9 u[IU]/mL (ref 0.450–4.500)

## 2022-06-01 ENCOUNTER — Encounter: Payer: Self-pay | Admitting: Nurse Practitioner

## 2022-06-01 ENCOUNTER — Ambulatory Visit (INDEPENDENT_AMBULATORY_CARE_PROVIDER_SITE_OTHER): Payer: Medicare HMO | Admitting: Nurse Practitioner

## 2022-06-01 VITALS — BP 132/60 | HR 75 | Ht 66.0 in | Wt 139.8 lb

## 2022-06-01 DIAGNOSIS — I10 Essential (primary) hypertension: Secondary | ICD-10-CM

## 2022-06-01 DIAGNOSIS — E039 Hypothyroidism, unspecified: Secondary | ICD-10-CM | POA: Diagnosis not present

## 2022-06-01 DIAGNOSIS — C678 Malignant neoplasm of overlapping sites of bladder: Secondary | ICD-10-CM

## 2022-06-01 MED ORDER — AMLODIPINE BESYLATE 10 MG PO TABS: ORAL_TABLET | ORAL | 1 refills | Status: DC

## 2022-06-01 NOTE — Assessment & Plan Note (Signed)
Stable on medication. Continue levothyroxine 75 mcg daily

## 2022-06-01 NOTE — Assessment & Plan Note (Signed)
Patient is followed by the oncologist Dr. Mayra Neer.

## 2022-06-01 NOTE — Assessment & Plan Note (Addendum)
His blood pressure 132/60 in the office today. Advised patient to continue amlodipine 10 mg daily.

## 2022-06-01 NOTE — Progress Notes (Signed)
Established Patient Office Visit  Subjective:  Patient ID: Edward Cummings, male    DOB: 20-Sep-1932  Age: 88 y.o. MRN: [id number]  CC:  Chief Complaint  Patient presents with   Hypertension    2 month fu, no new problems     HPI  Edward Cummings presents for follow up on hypertension. He lives with his son. He is doing fine. No new concerns at present.   HPI   Past Medical History:  Diagnosis Date   Anemia    Anxiety 12/31/2019   Cancer [facility])    bladder   Chronic kidney disease    Depression    Hypertension    Neuromuscular disorder ([place])    Nerve damage to left face/eye since around 2002.    Past Surgical History:  Procedure Laterality Date   CYSTOSCOPY W/ RETROGRADES Bilateral 01/25/2018   Procedure: CYSTOSCOPY WITH RETROGRADE PYELOGRAM;  [name], MD;  [name];  Service: Urology;  Laterality: Bilateral;   CYSTOSCOPY W/ URETERAL STENT PLACEMENT Bilateral 01/06/2019   Procedure: CYSTOSCOPY WITH RETROGRADE PYELOGRAM/URETERAL STENT REMOVAL;  [name], MD;  [name];  Service: Urology;  Laterality: Bilateral;   CYSTOSCOPY WITH BIOPSY N/A 09/21/2020   Procedure: CYSTOSCOPY WITH BLADDER BIOPSY;  [name], MD;  [name];  Service: Urology;  [name];   CYSTOSCOPY WITH FULGERATION N/A 09/21/2020   Procedure: CYSTOSCOPY WITH FULGERATION;  [name], MD;  [name];  Service: Urology;  [name];   CYSTOSCOPY WITH STENT PLACEMENT Bilateral 01/25/2018   Procedure: CYSTOSCOPY WITH STENT PLACEMENT;  [name], MD;  [name];  Service: Urology;  Laterality: Bilateral;   DORSAL SLIT N/A 01/06/2019   Procedure: DORSAL SLIT;  [name], MD;  [name];  Service: Urology;  [name];   ESOPHAGOGASTRODUODENOSCOPY (EGD) WITH PROPOFOL N/A 11/12/2019   Procedure: ESOPHAGOGASTRODUODENOSCOPY (EGD) WITH PROPOFOL;  Surgeon:  Virgel Manifold, MD;  Location: [facility];  Service: Endoscopy;  [name];   EYE SURGERY     Cornea transplants bilaterally & cataract surgery.   TRANSURETHRAL RESECTION OF BLADDER TUMOR N/A 01/25/2018   Procedure: TRANSURETHRAL RESECTION OF BLADDER TUMOR (TURBT);  [name], MD;  [name];  Service: Urology;  [name];    Family History  Problem Relation Age of Onset   Prostate cancer Neg Hx    Kidney cancer Neg Hx    Bladder Cancer Neg Hx     Social History   Socioeconomic History   Marital status: Widowed    Spouse name: [name]   Number of children: 2   Years of education: 6th grade   Highest education level: 6th grade  Occupational History   Not on file  Tobacco Use   Smoking status: Former    Types: Cigarettes    Quit date: 2010    Years since quitting: 13.9   Smokeless tobacco: Current    Types: Chew   Tobacco comments:    Stopped approximately 10 years ago.  Vaping Use   Vaping Use: Never used  Substance and Sexual Activity   Alcohol use: Yes    Alcohol/week: 2.0 standard drinks of alcohol    Types: 2 Cans of beer per week    Comment: occasionally   Drug use: Never   Sexual activity: Not Currently    Birth control/protection: None  Other Topics Concern   Not on file  Social History Narrative    lives in [place]; with oldest son. Wife died in 09-18-18.  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 Pt still drives and is indepent with no serious chronic disease. Worked in [place] Strain: Low Risk  (06/23/2021)   Overall Financial Resource Strain (CARDIA)    Difficulty of Paying Living Expenses: Not very hard  Food Insecurity: No Food Insecurity (02/15/2022)   Hunger Vital Sign    Worried About Running Out of Food in the Last Year: Never true    Ran Out of Food in the Last Year: Never true   Transportation Needs: No Transportation Needs (02/15/2022)   PRAPARE - Hydrologist (Medical): No    Lack of Transportation (Non-Medical): No  Physical Activity: Insufficiently Active (05/27/2021)   Exercise Vital Sign    Days of Exercise per Week: 5 days    Minutes of Exercise per Session: 10 min  Stress: No Stress Concern Present (05/27/2021)   [place]    Feeling of Stress : Only a little  Social Connections: Unknown (05/27/2021)   Social Connection and Isolation Panel [NHANES]    Frequency of Communication with Friends and Family: More than three times a week    Frequency of Social Gatherings with Friends and Family: Three times a week    Attends Religious Services: Not on file    Active Member of Clubs or Organizations: Not on file    Attends Club or Organization Meetings: Not on file    Marital Status: Widowed  Intimate Partner Violence: Not At Risk (05/27/2021)   Humiliation, Afraid, Rape, and Kick questionnaire    Fear of Current or Ex-Partner: No    Emotionally Abused: No    Physically Abused: No    Sexually Abused: No     Outpatient Medications Prior to Visit  Medication Sig Dispense Refill   aspirin EC 81 MG tablet Take 81 mg by mouth daily.     levothyroxine (SYNTHROID) 75 MCG tablet TAKE 1 TABLET BY MOUTH ONCE A DAY BEFOREBREAKFAST. 30 tablet 3   loratadine (CLARITIN) 10 MG tablet TAKE 1 TABLET BY MOUTH ONCE A DAY 30 tablet 11   mometasone (NASONEX) 50 MCG/ACT nasal spray PLACE 2 SPRAYS INTO THE NOSE DAILY 17 g 3   Multiple Vitamin (MULTIVITAMIN WITH MINERALS) TABS tablet Take 1 tablet by mouth daily.      prednisoLONE acetate (PRED FORTE) 1 % ophthalmic suspension Place 1 drop into both eyes daily at 2 PM. 5 mL 4   predniSONE (DELTASONE) 5 MG tablet TAKE 1 TABLET BY MOUTH ONCE A DAY WITH BREAKFAST. 60 tablet 0   amLODipine (NORVASC) 10 MG tablet Take 5 mg by mouth daily.      No facility-administered medications prior to visit.    No Known Allergies  ROS Review of Systems  Constitutional: Negative.   HENT: Negative.    Eyes: Negative.   Respiratory:  Negative for shortness of breath and wheezing.   Cardiovascular:  Negative for leg swelling.  Gastrointestinal: Negative.   Genitourinary: Negative.   Musculoskeletal:  Positive for arthralgias.  Neurological: Negative.   Psychiatric/Behavioral: Negative.        Objective:    Physical Exam Constitutional:      Appearance: Normal appearance.  HENT:     Head: Normocephalic.  Right Ear: Tympanic membrane normal.     Left Ear: Tympanic membrane normal.     Mouth/Throat:     Mouth: Mucous membranes are moist.     Dentition: Abnormal dentition (Edentulous).  Eyes:     Pupils: Pupils are equal, round, and reactive to light.     Comments: Drooping of left eyelid  Cardiovascular:     Rate and Rhythm: Normal rate and regular rhythm.     Pulses: Normal pulses.     Heart sounds: Normal heart sounds. No murmur heard. Pulmonary:     Effort: Pulmonary effort is normal. No respiratory distress.     Breath sounds: Normal breath sounds. No rhonchi.  Abdominal:     General: Bowel sounds are normal. There is no distension.     Palpations: Abdomen is soft.     Hernia: No hernia is present.  Musculoskeletal:        General: No swelling or signs of injury.  Skin:    Coloration: Skin is not jaundiced.     Findings: No erythema.  Neurological:     General: No focal deficit present.     Mental Status: He is alert and oriented to person, place, and time. Mental status is at baseline.  Psychiatric:        Mood and Affect: Mood normal.        Behavior: Behavior normal.        Thought Content: Thought content normal.        Judgment: Judgment normal.     BP 132/60   Pulse 75   Ht '5\' 6"'$  (1.676 m)   Wt 139 lb 12.8 oz (63.4 kg)   SpO2 96%   BMI 22.56 kg/m  Wt Readings from Last 3 Encounters:   06/01/22 139 lb 12.8 oz (63.4 kg)  05/08/22 133 lb (60.3 kg)  03/23/22 133 lb 1.6 oz (60.4 kg)     Health Maintenance  Topic Date Due   Medicare Annual Wellness (AWV)  05/27/2022   COVID-19 Vaccine (4 - 2023-24 season) 06/17/2022 (Originally 02/10/2022)   Pneumonia Vaccine 20+ Years old  Completed   INFLUENZA VACCINE  Completed   Zoster Vaccines- Shingrix  Completed   HPV VACCINES  Aged Out   DTaP/Tdap/Td  Discontinued    There are no preventive care reminders to display for this patient.  Lab Results  Component Value Date   TSH 2.900 05/09/2022   Lab Results  Component Value Date   WBC 12.3 (H) 05/09/2022   HGB 11.6 (L) 05/09/2022   HCT 35.4 (L) 05/09/2022   MCV 100.0 05/09/2022   PLT 234 05/09/2022   Lab Results  Component Value Date   NA 136 05/09/2022   K 5.1 05/09/2022   CO2 23 05/09/2022   GLUCOSE 150 (H) 05/09/2022   BUN 39 (H) 05/09/2022   CREATININE 2.47 (H) 05/09/2022   BILITOT 0.5 05/09/2022   ALKPHOS 60 05/09/2022   AST 33 05/09/2022   ALT 31 05/09/2022   PROT 7.2 05/09/2022   ALBUMIN 4.0 05/09/2022   CALCIUM 9.3 05/09/2022   ANIONGAP 6 05/09/2022   No results found for: "CHOL" No results found for: "HDL" No results found for: "LDLCALC" No results found for: "TRIG" No results found for: "CHOLHDL" Lab Results  Component Value Date   HGBA1C 4.5 (L) 04/13/2018      Assessment & Plan:   Problem List Items Addressed This Visit       Cardiovascular and Mediastinum   Hypertension - Primary  His blood pressure 132/60 in the office today. Advised patient to continue amlodipine 10 mg daily.      Relevant Medications   amLODipine (NORVASC) 10 MG tablet     Endocrine   Hypothyroidism    Stable on medication. Continue levothyroxine 75 mcg daily        Genitourinary   Cancer of overlapping sites of bladder [facility])    Patient is followed by the oncologist Dr. Mayra Neer.        Meds ordered this encounter  Medications    amLODipine (NORVASC) 10 MG tablet    Sig: Take 1 tablet daily    Dispense:  90 tablet    Refill:  1     Follow-up: No follow-ups on file.    Theresia Lo, NP

## 2022-06-15 ENCOUNTER — Ambulatory Visit (INDEPENDENT_AMBULATORY_CARE_PROVIDER_SITE_OTHER): Payer: Medicare HMO | Admitting: Nurse Practitioner

## 2022-06-15 ENCOUNTER — Encounter: Payer: Self-pay | Admitting: Nurse Practitioner

## 2022-06-15 VITALS — BP 130/66 | HR 46 | Ht 66.0 in | Wt 140.9 lb

## 2022-06-15 DIAGNOSIS — M7521 Bicipital tendinitis, right shoulder: Secondary | ICD-10-CM | POA: Diagnosis not present

## 2022-06-15 NOTE — Progress Notes (Signed)
Established Patient Office Visit  Subjective:  Patient ID: Edward Cummings, male    DOB: 12/22/1932  Age: 89 y.o. MRN: [id number]  CC:  Chief Complaint  Patient presents with   Arm Problem    Right arm muscle is swollen x week. Not really painful.      HPI  Edward Cummings presents for swollen right upper arm since last 4 days. He was lifting up his grand daughter on Sunday and felt. He denies any weakness in the arm. Some uncomfortable sensation but no pain.   HPI   Past Medical History:  Diagnosis Date   Anemia    Anxiety 12/31/2019   Cancer [facility])    bladder   Chronic kidney disease    Depression    Hypertension    Neuromuscular disorder ([place])    Nerve damage to left face/eye since around 2002.    Past Surgical History:  Procedure Laterality Date   CYSTOSCOPY W/ RETROGRADES Bilateral 01/25/2018   Procedure: CYSTOSCOPY WITH RETROGRADE PYELOGRAM;  [name], MD;  [name];  Service: Urology;  Laterality: Bilateral;   CYSTOSCOPY W/ URETERAL STENT PLACEMENT Bilateral 01/06/2019   Procedure: CYSTOSCOPY WITH RETROGRADE PYELOGRAM/URETERAL STENT REMOVAL;  [name], MD;  [name];  Service: Urology;  Laterality: Bilateral;   CYSTOSCOPY WITH BIOPSY N/A 09/21/2020   Procedure: CYSTOSCOPY WITH BLADDER BIOPSY;  [name], MD;  [name];  Service: Urology;  [name];   CYSTOSCOPY WITH FULGERATION N/A 09/21/2020   Procedure: CYSTOSCOPY WITH FULGERATION;  [name], MD;  [name];  Service: Urology;  [name];   CYSTOSCOPY WITH STENT PLACEMENT Bilateral 01/25/2018   Procedure: CYSTOSCOPY WITH STENT PLACEMENT;  [name], MD;  [name];  Service: Urology;  Laterality: Bilateral;   DORSAL SLIT N/A 01/06/2019   Procedure: DORSAL SLIT;  [name], MD;  [name];  Service: Urology;  [name];    ESOPHAGOGASTRODUODENOSCOPY (EGD) WITH PROPOFOL N/A 11/12/2019   Procedure: ESOPHAGOGASTRODUODENOSCOPY (EGD) WITH PROPOFOL;  Surgeon: Virgel Manifold, MD;  Location: [facility];  Service: Endoscopy;  [name];   EYE SURGERY     Cornea transplants bilaterally & cataract surgery.   TRANSURETHRAL RESECTION OF BLADDER TUMOR N/A 01/25/2018   Procedure: TRANSURETHRAL RESECTION OF BLADDER TUMOR (TURBT);  [name], MD;  [name];  Service: Urology;  [name];    Family History  Problem Relation Age of Onset   Prostate cancer Neg Hx    Kidney cancer Neg Hx    Bladder Cancer Neg Hx     Social History   Socioeconomic History   Marital status: Widowed    Spouse name: [name]   Number of children: 2   Years of education: 6th grade   Highest education level: 6th grade  Occupational History   Not on file  Tobacco Use   Smoking status: Former    Types: Cigarettes    Quit date: 2010    Years since quitting: 14.0   Smokeless tobacco: Current    Types: Chew   Tobacco comments:    Stopped approximately 10 years ago.  Vaping Use   Vaping Use: Never used  Substance and Sexual Activity   Alcohol use: Yes    Alcohol/week: 2.0 standard drinks of alcohol    Types: 2 Cans of beer per week    Comment: occasionally   Drug use: Never  Sexual activity: Not Currently    Birth control/protection: None  Other Topics Concern   Not on file  Social History Narrative    lives in [place]; with oldest son. Wife died in 09-19-2018.  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 Pt still drives and is indepent with no serious chronic disease. Worked in [place] Strain: Low Risk  (06/23/2021)   Overall Financial Resource Strain (CARDIA)    Difficulty of Paying Living Expenses: Not very hard  Food Insecurity: No Food Insecurity (02/15/2022)   Hunger Vital  Sign    Worried About Running Out of Food in the Last Year: Never true    Ran Out of Food in the Last Year: Never true  Transportation Needs: No Transportation Needs (02/15/2022)   PRAPARE - Hydrologist (Medical): No    Lack of Transportation (Non-Medical): No  Physical Activity: Insufficiently Active (05/27/2021)   Exercise Vital Sign    Days of Exercise per Week: 5 days    Minutes of Exercise per Session: 10 min  Stress: No Stress Concern Present (05/27/2021)   [place]    Feeling of Stress : Only a little  Social Connections: Unknown (05/27/2021)   Social Connection and Isolation Panel [NHANES]    Frequency of Communication with Friends and Family: More than three times a week    Frequency of Social Gatherings with Friends and Family: Three times a week    Attends Religious Services: Not on file    Active Member of Clubs or Organizations: Not on file    Attends Club or Organization Meetings: Not on file    Marital Status: Widowed  Intimate Partner Violence: Not At Risk (05/27/2021)   Humiliation, Afraid, Rape, and Kick questionnaire    Fear of Current or Ex-Partner: No    Emotionally Abused: No    Physically Abused: No    Sexually Abused: No     Outpatient Medications Prior to Visit  Medication Sig Dispense Refill   amLODipine (NORVASC) 10 MG tablet Take 1 tablet daily 90 tablet 1   aspirin EC 81 MG tablet Take 81 mg by mouth daily.     levothyroxine (SYNTHROID) 75 MCG tablet TAKE 1 TABLET BY MOUTH ONCE A DAY BEFOREBREAKFAST. 30 tablet 3   loratadine (CLARITIN) 10 MG tablet TAKE 1 TABLET BY MOUTH ONCE A DAY 30 tablet 11   mometasone (NASONEX) 50 MCG/ACT nasal spray PLACE 2 SPRAYS INTO THE NOSE DAILY 17 g 3   Multiple Vitamin (MULTIVITAMIN WITH MINERALS) TABS tablet Take 1 tablet by mouth daily.      prednisoLONE acetate (PRED FORTE) 1 % ophthalmic suspension Place 1 drop into both  eyes daily at 2 PM. 5 mL 4   predniSONE (DELTASONE) 5 MG tablet TAKE 1 TABLET BY MOUTH ONCE A DAY WITH BREAKFAST. 60 tablet 0   No facility-administered medications prior to visit.    No Known Allergies  ROS Review of Systems  Constitutional: Negative.   HENT: Negative.    Eyes: Negative.   Respiratory:  Negative for choking and shortness of breath.   Gastrointestinal: Negative.   Musculoskeletal:        Swelling of upper right arm muscle      Objective:    Physical Exam Constitutional:      Appearance: Normal appearance. He is normal weight.  HENT:     Right Ear: Tympanic membrane normal.     Nose: Nose normal.     Mouth/Throat:     Mouth: Mucous membranes are moist.     Pharynx: Oropharynx is clear.  Eyes:     Conjunctiva/sclera: Conjunctivae normal.     Pupils: Pupils are equal, round, and reactive to light.  Cardiovascular:     Pulses: Normal pulses.     Heart sounds: Normal heart sounds.  Musculoskeletal:        General: Swelling present.     Right upper arm: Swelling present.       Arms:  Neurological:     Mental Status: He is alert.     BP 130/66   Pulse (!) 46   Ht '5\' 6"'$  (1.676 m)   Wt 140 lb 14.4 oz (63.9 kg)   SpO2 98%   BMI 22.74 kg/m  Wt Readings from Last 3 Encounters:  06/15/22 140 lb 14.4 oz (63.9 kg)  06/01/22 139 lb 12.8 oz (63.4 kg)  05/08/22 133 lb (60.3 kg)     Health Maintenance  Topic Date Due   COVID-19 Vaccine (4 - 2023-24 season) 02/10/2022   Medicare Annual Wellness (AWV)  05/27/2022   Pneumonia Vaccine 22+ Years old  Completed   INFLUENZA VACCINE  Completed   Zoster Vaccines- Shingrix  Completed   HPV VACCINES  Aged Out   DTaP/Tdap/Td  Discontinued    There are no preventive care reminders to display for this patient.  Lab Results  Component Value Date   TSH 2.900 05/09/2022   Lab Results  Component Value Date   WBC 12.3 (H) 05/09/2022   HGB 11.6 (L) 05/09/2022   HCT 35.4 (L) 05/09/2022   MCV 100.0  05/09/2022   PLT 234 05/09/2022   Lab Results  Component Value Date   NA 136 05/09/2022   K 5.1 05/09/2022   CO2 23 05/09/2022   GLUCOSE 150 (H) 05/09/2022   BUN 39 (H) 05/09/2022   CREATININE 2.47 (H) 05/09/2022   BILITOT 0.5 05/09/2022   ALKPHOS 60 05/09/2022   AST 33 05/09/2022   ALT 31 05/09/2022   PROT 7.2 05/09/2022   ALBUMIN 4.0 05/09/2022   CALCIUM 9.3 05/09/2022   ANIONGAP 6 05/09/2022   No results found for: "CHOL" No results found for: "HDL" No results found for: "LDLCALC" No results found for: "TRIG" No results found for: "CHOLHDL" Lab Results  Component Value Date   HGBA1C 4.5 (L) 04/13/2018      Assessment & Plan:   Problem List Items Addressed This Visit       Musculoskeletal and Integument   Biceps tendinitis of right upper extremity - Primary    Apply ice pack and rest the extremity. Avoid rapid movement. If symptoms does not improve we will send for physical therapy.        No orders of the defined types were placed in this encounter.    Follow-up: No follow-ups on file.    Theresia Lo, NP

## 2022-06-17 DIAGNOSIS — M7521 Bicipital tendinitis, right shoulder: Secondary | ICD-10-CM | POA: Insufficient documentation

## 2022-06-21 ENCOUNTER — Encounter: Payer: Self-pay | Admitting: Nurse Practitioner

## 2022-06-21 NOTE — Assessment & Plan Note (Signed)
Apply ice pack and rest the extremity. Avoid rapid movement. If symptoms does not improve we will send for physical therapy.

## 2022-06-22 ENCOUNTER — Other Ambulatory Visit: Payer: Self-pay | Admitting: Internal Medicine

## 2022-06-22 ENCOUNTER — Telehealth: Payer: Self-pay

## 2022-06-22 NOTE — Telephone Encounter (Signed)
error 

## 2022-06-30 ENCOUNTER — Other Ambulatory Visit: Payer: Self-pay | Admitting: *Deleted

## 2022-06-30 MED ORDER — LEVOTHYROXINE SODIUM 75 MCG PO TABS: ORAL_TABLET | ORAL | 3 refills | Status: DC

## 2022-06-30 NOTE — Telephone Encounter (Signed)
Telephone note

## 2022-06-30 NOTE — Telephone Encounter (Signed)
Thyroid Panel With TSH Order: [id number] Status: Final result     Visible to patient: Yes (seen)     Next appt: 09/04/2022 at 10:30 AM in Radiology ([facility])     Dx: Cancer of overlapping sites of bladde...   0 Result Notes           Component Ref Range & Units 1 mo ago (05/09/22) 5 mo ago (01/24/22) 8 mo ago (10/20/21) 11 mo ago (07/20/21) 1 yr ago (02/16/21) 1 yr ago (07/01/20) 2 yr ago (04/15/20)  TSH 0.450 - 4.500 uIU/mL 2.900 0.645 0.094 Low  10.008 High  R, CM 3.116 R, CM 1.695 R, CM 0.416 R, CM  T4, Total 4.5 - 12.0 ug/dL 5.5 5.5 7.9      T3 Uptake Ratio 24 - 39 % '25 27 31      '$ Free Thyroxine Index 1.2 - 4.9 1.4 1.5 CM 2.4 CM      Comment: (NOTE) Performed At: [facility] [address] [place], [place] [id number] Rush Farmer MD QA:[id number]  Resulting Agency  [facility] [place] [facility]         Specimen Collected: 05/09/22 14:18 Last Resulted: 05/10/22 13:36

## 2022-06-30 NOTE — Telephone Encounter (Signed)
I received request for RF on levothyroxine for Josh to refill. Pt has no f/u apt with Josh. Dr. Jacinto Reap to see patient in April.  Last thyroid panel was performed a month ago. Dr. B will you consider the RF.

## 2022-07-03 ENCOUNTER — Encounter: Payer: Self-pay | Admitting: Internal Medicine

## 2022-08-23 ENCOUNTER — Other Ambulatory Visit: Payer: Self-pay | Admitting: Internal Medicine

## 2022-08-24 ENCOUNTER — Encounter: Payer: Self-pay | Admitting: Internal Medicine

## 2022-08-25 ENCOUNTER — Other Ambulatory Visit: Payer: Self-pay | Admitting: Nurse Practitioner

## 2022-09-04 ENCOUNTER — Ambulatory Visit
Admission: RE | Admit: 2022-09-04 | Discharge: 2022-09-04 | Disposition: A | Discharge status: Home / Self Care | Payer: Medicare HMO | Source: Ambulatory Visit | Attending: Internal Medicine | Admitting: Internal Medicine

## 2022-09-04 DIAGNOSIS — C678 Malignant neoplasm of overlapping sites of bladder: Secondary | ICD-10-CM | POA: Insufficient documentation

## 2022-09-04 DIAGNOSIS — C649 Malignant neoplasm of unspecified kidney, except renal pelvis: Secondary | ICD-10-CM | POA: Diagnosis not present

## 2022-09-04 DIAGNOSIS — J432 Centrilobular emphysema: Secondary | ICD-10-CM | POA: Diagnosis not present

## 2022-09-04 DIAGNOSIS — N289 Disorder of kidney and ureter, unspecified: Secondary | ICD-10-CM | POA: Diagnosis not present

## 2022-09-05 ENCOUNTER — Ambulatory Visit: Payer: Medicare HMO | Admitting: Internal Medicine

## 2022-09-05 ENCOUNTER — Other Ambulatory Visit: Payer: Medicare HMO

## 2022-09-05 ENCOUNTER — Ambulatory Visit: Payer: Medicare HMO

## 2022-09-11 ENCOUNTER — Other Ambulatory Visit: Payer: Self-pay

## 2022-09-12 ENCOUNTER — Inpatient Hospital Stay: Payer: Medicare HMO | Attending: Internal Medicine

## 2022-09-12 ENCOUNTER — Inpatient Hospital Stay: Payer: Medicare HMO

## 2022-09-12 ENCOUNTER — Inpatient Hospital Stay (HOSPITAL_BASED_OUTPATIENT_CLINIC_OR_DEPARTMENT_OTHER): Payer: Medicare HMO | Admitting: Internal Medicine

## 2022-09-12 VITALS — BP 149/65 | HR 63 | Temp 97.8°F | Resp 18 | Wt 137.6 lb

## 2022-09-12 DIAGNOSIS — N400 Enlarged prostate without lower urinary tract symptoms: Secondary | ICD-10-CM | POA: Diagnosis not present

## 2022-09-12 DIAGNOSIS — Z7951 Long term (current) use of inhaled steroids: Secondary | ICD-10-CM | POA: Insufficient documentation

## 2022-09-12 DIAGNOSIS — C678 Malignant neoplasm of overlapping sites of bladder: Secondary | ICD-10-CM

## 2022-09-12 DIAGNOSIS — N183 Chronic kidney disease, stage 3 unspecified: Secondary | ICD-10-CM | POA: Diagnosis not present

## 2022-09-12 DIAGNOSIS — M8580 Other specified disorders of bone density and structure, unspecified site: Secondary | ICD-10-CM | POA: Diagnosis not present

## 2022-09-12 DIAGNOSIS — Z9221 Personal history of antineoplastic chemotherapy: Secondary | ICD-10-CM | POA: Diagnosis not present

## 2022-09-12 DIAGNOSIS — Z7982 Long term (current) use of aspirin: Secondary | ICD-10-CM | POA: Insufficient documentation

## 2022-09-12 DIAGNOSIS — Z7952 Long term (current) use of systemic steroids: Secondary | ICD-10-CM | POA: Insufficient documentation

## 2022-09-12 DIAGNOSIS — E032 Hypothyroidism due to medicaments and other exogenous substances: Secondary | ICD-10-CM | POA: Insufficient documentation

## 2022-09-12 DIAGNOSIS — D649 Anemia, unspecified: Secondary | ICD-10-CM | POA: Insufficient documentation

## 2022-09-12 DIAGNOSIS — Z79899 Other long term (current) drug therapy: Secondary | ICD-10-CM | POA: Insufficient documentation

## 2022-09-12 DIAGNOSIS — Z8551 Personal history of malignant neoplasm of bladder: Secondary | ICD-10-CM | POA: Diagnosis not present

## 2022-09-12 DIAGNOSIS — I129 Hypertensive chronic kidney disease with stage 1 through stage 4 chronic kidney disease, or unspecified chronic kidney disease: Secondary | ICD-10-CM | POA: Insufficient documentation

## 2022-09-12 DIAGNOSIS — Z87891 Personal history of nicotine dependence: Secondary | ICD-10-CM | POA: Diagnosis not present

## 2022-09-12 LAB — CBC WITH DIFFERENTIAL/PLATELET
Abs Immature Granulocytes: 0.05 10*3/uL (ref 0.00–0.07)
Basophils Absolute: 0.1 10*3/uL (ref 0.0–0.1)
Basophils Relative: 1 %
Eosinophils Absolute: 0.3 10*3/uL (ref 0.0–0.5)
Eosinophils Relative: 3 %
HCT: 34.1 % — ABNORMAL LOW (ref 39.0–52.0)
Hemoglobin: 11.2 g/dL — ABNORMAL LOW (ref 13.0–17.0)
Immature Granulocytes: 1 %
Lymphocytes Relative: 13 %
Lymphs Abs: 1.3 10*3/uL (ref 0.7–4.0)
MCH: 32 pg (ref 26.0–34.0)
MCHC: 32.8 g/dL (ref 30.0–36.0)
MCV: 97.4 fL (ref 80.0–100.0)
Monocytes Absolute: 0.5 10*3/uL (ref 0.1–1.0)
Monocytes Relative: 4 %
Neutro Abs: 8.2 10*3/uL — ABNORMAL HIGH (ref 1.7–7.7)
Neutrophils Relative %: 78 %
Platelets: 236 10*3/uL (ref 150–400)
RBC: 3.5 MIL/uL — ABNORMAL LOW (ref 4.22–5.81)
RDW: 12.9 % (ref 11.5–15.5)
WBC: 10.3 10*3/uL (ref 4.0–10.5)
nRBC: 0 % (ref 0.0–0.2)

## 2022-09-12 LAB — COMPREHENSIVE METABOLIC PANEL
ALT: 19 U/L (ref 0–44)
AST: 23 U/L (ref 15–41)
Albumin: 3.6 g/dL (ref 3.5–5.0)
Alkaline Phosphatase: 60 U/L (ref 38–126)
Anion gap: 7 (ref 5–15)
BUN: 30 mg/dL — ABNORMAL HIGH (ref 8–23)
CO2: 21 mmol/L — ABNORMAL LOW (ref 22–32)
Calcium: 9.1 mg/dL (ref 8.9–10.3)
Chloride: 106 mmol/L (ref 98–111)
Creatinine, Ser: 2.28 mg/dL — ABNORMAL HIGH (ref 0.61–1.24)
GFR, Estimated: 27 mL/min — ABNORMAL LOW (ref >60)
Glucose, Bld: 97 mg/dL (ref 70–99)
Potassium: 4.1 mmol/L (ref 3.5–5.1)
Sodium: 134 mmol/L — ABNORMAL LOW (ref 135–145)
Total Bilirubin: 0.6 mg/dL (ref 0.3–1.2)
Total Protein: 6.8 g/dL (ref 6.5–8.1)

## 2022-09-12 LAB — IRON AND TIBC
Iron: 93 ug/dL (ref 45–182)
Saturation Ratios: 35 % (ref 17.9–39.5)
TIBC: 265 ug/dL (ref 250–450)
UIBC: 172 ug/dL

## 2022-09-12 LAB — FERRITIN: Ferritin: 423 ng/mL — ABNORMAL HIGH (ref 24–336)

## 2022-09-12 NOTE — Progress Notes (Signed)
[place] NOTE  Patient Care Team: [name], [name], [name], MD (Inactive) as Consulting Physician (Gastroenterology) Clyde Canterbury, MD as Referring Physician (Otolaryngology) Ottie Glazier, MD as Consulting Physician (Pulmonary Disease) Abbie Sons, MD (Urology)  CHIEF COMPLAINTS/PURPOSE OF CONSULTATION: Bladder cancer   Oncology History Overview Note  # AUG 2019-TRANSITIONAL CELL BLADDER CA [~ 4cm tumor] s/p cystoscopy [Dr.Stoiff]  with extensive angiolymphatic invasion; [name]; [name]; mediastinal uptake; right pubic rami uptake.  # QW:[name]; Jan 18th 2021- switched to [place]- [pt preference; [name]; July 2020 cystoscopy-no evidence of bladder malignancy/Dr. Bernardo Heater- enlarged prostate [PSA- 0.95; 2021]  # Molecular testing- PDL-1 CPS- 20%; [name]  ;GOALS: palliative  CURRENT/MOST RECENT THERAPY:OPDIVO [C]     Cancer of overlapping sites of bladder  02/28/2018 - 06/09/2019 Chemotherapy   The patient had atezolizumab (TECENTRIQ) 1,[name], 1,200 mg, Intravenous, Once, [name],200 mg (02/28/2018), 1,200 mg (03/21/2018), 1,200 mg (04/11/2018), 1,200 mg (05/02/2018), 1,200 mg (06/13/2018), 1,200 mg (05/23/2018), 1,200 mg (07/04/2018), 1,200 mg (07/25/2018), 1,200 mg (08/15/2018), 1,200 mg (09/05/2018), 1,200 mg (10/25/2018), 1,200 mg (11/22/2018), 1,200 mg (12/20/2018), 1,200 mg (01/10/2019), 1,200 mg (01/31/2019), 1,200 mg (02/21/2019), 1,200 mg (03/14/2019), 1,200 mg (04/04/2019),  1,200 mg (04/25/2019), 1,200 mg (05/16/2019), 1,200 mg (06/09/2019)  for chemotherapy treatment.    06/30/2019 - 06/17/2020 Chemotherapy   Patient is on Treatment Plan : BLADDER Nivolumab q14d      HISTORY OF PRESENTING ILLNESS: Patient ambulating independently.  Accompanied by his son.   [name] 89 y.o.  male with metastatic transitional carcinoma of the bladder most recently on Opdivo [currently on hold because of poor tolerance] is here for follow-up/ CT scan results review.  Pt has some lightheadedness.Has some fatigue.  Chronic dyspnea with exertion. Chronic low back pain. Appetite is good. No visible blood in stools.  Appetite is good.  No weight loss.  No nausea no vomiting. Continues to be on prednisone 5 mg a day.   Review of Systems  Constitutional:  Positive for malaise/fatigue. Negative for chills, diaphoresis and fever.  HENT:  Negative for nosebleeds and sore throat.   Eyes:  Negative for double vision.  Respiratory:  Negative for hemoptysis, sputum production and wheezing.   Cardiovascular:  Negative for chest pain, palpitations, orthopnea and leg swelling.  Gastrointestinal:  Negative for abdominal pain, blood in stool, diarrhea, heartburn, melena, nausea and vomiting.  Genitourinary:  Negative for dysuria.  Musculoskeletal:  Positive for back pain and joint pain.  Skin: Negative.  Negative for itching and rash.  Neurological:  Negative for tingling, focal weakness and headaches.  Endo/Heme/Allergies:  Does not bruise/bleed easily.  Psychiatric/Behavioral:  Negative for depression. The patient is not nervous/anxious and does not have insomnia.      MEDICAL HISTORY:  Past Medical History:  Diagnosis Date   Anemia    Anxiety 12/31/2019   Cancer [facility])    bladder   Chronic kidney disease    Depression    Hypertension    Neuromuscular disorder ([place])    Nerve  damage to left face/eye since around 09-Oct-2000.    SURGICAL HISTORY: Past Surgical History:  Procedure  Laterality Date   CYSTOSCOPY W/ RETROGRADES Bilateral 01/25/2018   Procedure: CYSTOSCOPY WITH RETROGRADE PYELOGRAM;  [name], MD;  [name];  Service: Urology;  Laterality: Bilateral;   CYSTOSCOPY W/ URETERAL STENT PLACEMENT Bilateral 01/06/2019   Procedure: CYSTOSCOPY WITH RETROGRADE PYELOGRAM/URETERAL STENT REMOVAL;  [name], MD;  [name];  Service: Urology;  Laterality: Bilateral;   CYSTOSCOPY WITH BIOPSY N/A 09/21/2020   Procedure: CYSTOSCOPY WITH BLADDER BIOPSY;  [name], MD;  [name];  Service: Urology;  [name];   CYSTOSCOPY WITH FULGERATION N/A 09/21/2020   Procedure: CYSTOSCOPY WITH FULGERATION;  [name], MD;  [name];  Service: Urology;  [name];   CYSTOSCOPY WITH STENT PLACEMENT Bilateral 01/25/2018   Procedure: CYSTOSCOPY WITH STENT PLACEMENT;  [name], MD;  [name];  Service: Urology;  Laterality: Bilateral;   DORSAL SLIT N/A 01/06/2019   Procedure: DORSAL SLIT;  [name], MD;  [name];  Service: Urology;  [name];   ESOPHAGOGASTRODUODENOSCOPY (EGD) WITH PROPOFOL N/A 11/12/2019   Procedure: ESOPHAGOGASTRODUODENOSCOPY (EGD) WITH PROPOFOL;  Surgeon: Virgel Manifold, MD;  Location: [facility];  Service: Endoscopy;  [name];   EYE SURGERY     Cornea transplants bilaterally & cataract surgery.   TRANSURETHRAL RESECTION OF BLADDER TUMOR N/A 01/25/2018   Procedure: TRANSURETHRAL RESECTION OF BLADDER TUMOR (TURBT);  [name], MD;  [name];  Service: Urology;  [name];    SOCIAL HISTORY: Social History   Socioeconomic History   Marital status: Widowed    Spouse name: [name]   Number of children: 2   Years of education: 6th grade   Highest education level: 6th grade  Occupational History   Not on file  Tobacco Use   Smoking status: Former     Types: Cigarettes    Quit date: October 09, 2008    Years since quitting: 14.2   Smokeless tobacco: Current    Types: Chew   Tobacco comments:    Stopped approximately 10 years ago.  Vaping Use   Vaping Use: Never used  Substance and Sexual Activity   Alcohol use: Yes    Alcohol/week: 2.0 standard drinks of alcohol    Types: 2 Cans of beer per week    Comment: occasionally   Drug use: Never   Sexual activity: Not Currently    Birth control/protection: None  Other Topics Concern   Not on file  Social History Narrative    lives in [place]; with oldest son. Wife died in Oct 10, 2018.  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 Pt still drives and is indepent with no serious chronic disease. Worked in [place] Strain: Low Risk  (06/23/2021)   Overall Financial Resource Strain (CARDIA)    Difficulty of Paying Living Expenses: Not very hard  Food Insecurity: No Food Insecurity (02/15/2022)   Hunger Vital Sign    Worried About Running Out of Food in the Last Year: Never true    Ran Out of Food in the Last Year: Never true  Transportation Needs: No Transportation Needs (02/15/2022)   PRAPARE - Hydrologist (Medical): No    Lack of Transportation (Non-Medical): No  Physical  Activity: Insufficiently Active (05/27/2021)   Exercise Vital Sign    Days of Exercise per Week: 5 days    Minutes of Exercise per Session: 10 min  Stress: No Stress Concern Present (05/27/2021)   [place]    Feeling of Stress : Only a little  Social Connections: Unknown (05/27/2021)   Social Connection and Isolation Panel [NHANES]    Frequency of Communication with Friends and Family: More than three times a week    Frequency of Social Gatherings with Friends and Family: Three times a week    Attends Religious Services: Not on  file    Active Member of Clubs or Organizations: Not on file    Attends Club or Organization Meetings: Not on file    Marital Status: Widowed  Intimate Partner Violence: Not At Risk (05/27/2021)   Humiliation, Afraid, Rape, and Kick questionnaire    Fear of Current or Ex-Partner: No    Emotionally Abused: No    Physically Abused: No    Sexually Abused: No    FAMILY HISTORY: Family History  Problem Relation Age of Onset   Prostate cancer Neg Hx    Kidney cancer Neg Hx    Bladder Cancer Neg Hx     ALLERGIES:  has No Known Allergies.  MEDICATIONS:  Current Outpatient Medications  Medication Sig Dispense Refill   amLODipine (NORVASC) 10 MG tablet TAKE ONE TABLET BY MOUTH ONCE DAILY 90 tablet 1   aspirin EC 81 MG tablet Take 81 mg by mouth daily.     levothyroxine (SYNTHROID) 75 MCG tablet TAKE 1 TABLET BY MOUTH ONCE A DAY BEFOREBREAKFAST. 30 tablet 3   loratadine (CLARITIN) 10 MG tablet TAKE 1 TABLET BY MOUTH ONCE A DAY 30 tablet 11   mometasone (NASONEX) 50 MCG/ACT nasal spray PLACE 2 SPRAYS INTO THE NOSE DAILY 17 g 3   Multiple Vitamin (MULTIVITAMIN WITH MINERALS) TABS tablet Take 1 tablet by mouth daily.      prednisoLONE acetate (PRED FORTE) 1 % ophthalmic suspension Place 1 drop into both eyes daily at 2 PM. 5 mL 4   predniSONE (DELTASONE) 5 MG tablet TAKE ONE TABLET BY MOUTH ONCE A DAY WITH BREAKFAST. 60 tablet 0   No current facility-administered medications for this visit.      Marland Kitchen  PHYSICAL EXAMINATION: ECOG PERFORMANCE STATUS: 1 - Symptomatic but completely ambulatory  Vitals:   09/12/22 1302  BP: (!) 149/65  Pulse: 63  Resp: 18  Temp: 97.8 F (36.6 C)  SpO2: 100%    Filed Weights   09/12/22 1302  Weight: 137 lb 9.6 oz (62.4 kg)     Physical Exam HENT:     Head: Normocephalic and atraumatic.     Mouth/Throat:     Pharynx: No oropharyngeal exudate.  Eyes:     Pupils: Pupils are equal, round, and reactive to light.     Comments: Chronic drooping of  the left eyelid.  Cardiovascular:     Rate and Rhythm: Normal rate and regular rhythm.  Pulmonary:     Effort: No respiratory distress.     Breath sounds: No wheezing.  Abdominal:     General: Bowel sounds are normal. There is no distension.     Palpations: Abdomen is soft. There is no mass.     Tenderness: There is no abdominal tenderness. There is no guarding or rebound.  Musculoskeletal:        General: No tenderness. Normal range of motion.  Cervical back: Normal range of motion and neck supple.  Skin:    General: Skin is warm.  Neurological:     Mental Status: He is alert and oriented to person, place, and time.  Psychiatric:        Mood and Affect: Affect normal.      LABORATORY DATA:  I have reviewed the data as listed Lab Results  Component Value Date   WBC 10.3 09/12/2022   HGB 11.2 (L) 09/12/2022   HCT 34.1 (L) 09/12/2022   MCV 97.4 09/12/2022   PLT 236 09/12/2022   Recent Labs    01/24/22 1209 05/09/22 1418 09/12/22 1240  NA 137 136 134*  K 5.0 5.1 4.1  CL 107 107 106  CO2 23 23 21*  GLUCOSE 92 150* 97  BUN 25* 39* 30*  CREATININE 1.99* 2.47* 2.28*  CALCIUM 9.2 9.3 9.1  GFRNONAA 32* 25* 27*  PROT 7.4 7.2 6.8  ALBUMIN 3.9 4.0 3.6  AST 23 33 23  ALT 19 31 19   ALKPHOS 85 60 60  BILITOT 0.6 0.5 0.6    RADIOGRAPHIC STUDIES: I have personally reviewed the radiological images as listed and agreed with the findings in the report. CT CHEST ABDOMEN PELVIS WO CONTRAST  Result Date: 09/05/2022 CLINICAL DATA:  Bladder cancer monitoring. Previous TURBT and chemotherapy. * Tracking Code: BO * EXAM: CT CHEST, ABDOMEN AND PELVIS WITHOUT CONTRAST TECHNIQUE: Multidetector CT imaging of the chest, abdomen and pelvis was performed following the standard protocol without IV contrast. RADIATION DOSE REDUCTION: This exam was performed according to the departmental dose-optimization program which includes automated exposure control, adjustment of the mA and/or kV  according to patient size and/or use of iterative reconstruction technique. COMPARISON:  CT 01/20/2022 and older FINDINGS: CT CHEST FINDINGS Cardiovascular: On this non IV contrast exam, the heart is nonenlarged. No pericardial effusion. The thoracic aorta has scattered vascular calcifications and there is calcifications along branch vessels including the great vessels in the coronary arteries. Diameter of the ascending aorta at the level of the right pulmonary artery measures 3.8 by 3.8 cm. Not significantly changed when adjusting for technique. Mediastinum/Nodes: On this non IV contrast exam there is no specific abnormal lymph node enlargement identified in the axillary region, hilum or mediastinum. Atrophic thyroid gland. Moderate hiatal hernia. The esophagus above the hernia slightly patulous. Lungs/Pleura: Centrilobular emphysematous changes are identified. There also areas of scattered peripheral interstitial septal thickening with scarring and fibrotic changes. There is also some honeycombing along the lung bases and some subtle bronchiectasis. Additional small bilateral calcified lung nodules are seen consistent with old granulomatous disease. Few tiny noncalcified nodules are seen such as 3 mm right lung laterally on series 3, image 64 which in retrospect is unchanged. No pleural effusion, consolidation or pneumothorax. A few calcified pleural plaques are again noted bilaterally. Please correlate with environmental exposure or other process. Musculoskeletal: Mild degenerative changes along the spine. Osteopenia. CT ABDOMEN PELVIS FINDINGS Hepatobiliary: On this non IV contrast exam, the liver is grossly preserved. Contracted gallbladder. Pancreas: Unremarkable. No pancreatic ductal dilatation or surrounding inflammatory changes. Spleen: Normal in size without focal abnormality. Adrenals/Urinary Tract: The adrenal glands are preserved. Mild bilateral renal atrophy. Once again multiple bilateral renal cysts  are identified largest on the right previously measured 21 mm with Hounsfield units of 13. Today this same lesion on series 2, image 74 measures 2.3 cm in diameter and Hounsfield unit of 10. Both kidneys has some lesions which are mildly hyperdense such as  exophytic anterior measuring 10 mm on series 2, image 63 with Hounsfield unit of 30. Posterior focus on the right has a diameter of 16 mm and Hounsfield of 24 on series 2, image 73. These lesions are unchanged in size previous. Presumed parapelvic renal cysts are identified as well. The ureters have a normal course and caliber down to the contracted urinary bladder. The bladder wall slightly thickened diffusely. No renal or ureteral stones. Stomach/Bowel: The large bowel has a normal course and caliber. Scattered colonic stool. Few left-sided colonic diverticula. Normal caliber appendix. The tip of the appendix extends into the opening of the right inguinal canal. Stomach is mildly distended with fluid. Small bowel is nondilated. Vascular/Lymphatic: Diffuse vascular calcifications are identified including extending along branch vessels. This includes the iliac vessels and renal arteries. Please correlate with any symptomatology. Normal caliber IVC. No specific abnormal lymph node enlargement identified in the abdomen and pelvis. Reproductive: Enlarged prostate. Other: No abdominal wall hernia or abnormality. No abdominopelvic ascites. Musculoskeletal: Scattered degenerative changes of the spine and pelvis. Osteopenia. IMPRESSION: No significant interval change. No developing new mass lesion, fluid collection or lymph node enlargement. Moderate hiatal hernia. Chronic lung changes identified including centrilobular emphysematous change, peripheral interstitial fibrosis and calcified pleural plaques. Multiple bilateral renal cystic foci. Unchanged from previous. Some are slightly hyperdense. Simple attention on follow-up. Enlarged prostate.  Diffuse urinary bladder  wall thickening. Stable mildly ectatic ascending aorta of 3.8 cm with some calcifications along the aortic valve and other vascular and coronary artery calcifications. Electronically Signed   By: Jill Side M.D.   On: 09/05/2022 10:24     ASSESSMENT & PLAN:   Cancer of overlapping sites of bladder ([place]) # High-grade transitional cell carcinoma of the bladder metastatic to retroperitoneal lymph node.   Stage IV- MARCH 26 th, 2024- No significant interval change; No developing new mass lesion, fluid collection or lymph node enlargement.  Last immunotherapy infusion January 2022.  Continue surveillance at this time.  Plan imaging only if patient is symptomatic.  Discuss with family.  # History of local recurrent bladder ca < 1cm agree with TURBT [march 2022] [Dr.Stoiof-cystoscopy NOV 2023]- s/p evaluation with urology-negative for recurrence stable.   # Pulmonary: SEP 2022- CT-emphysema /bronchial thickening /bilateral basilar groundglass opacities -?  Resolving  BOOP/ fibrosis Vs. fungal infection [s/p itraconazole; Dr.Aleskerov]; pleural plaques noted-CT scan March 2024 stable.     # Iatrogenic hypothyroidism- on Synthroid; 100 mcg wll repeat today-awaiting labs today. Stable.   # Anemia sec to CKD-III-IV/ on Iv iron.Hb-11-12-Clinically- Stable. ; Hold Venofer today.  # CKD stageIII-IV GFR-24 -Clinically  Stable. recommend increase fluid intake.  #Weight loss improving; continue prednisone for appetite/also above pneumonitis- 5mg  /day.   # IV access: NO port/PIV  #Incidental findings on Imaging  CT , MARCH 2024: Pleural plaques atherosclerosis emphysema; prostatomegaly; bladder thickening -I reviewed/discussed/counseled the patient.   mychart-Thyroid  # DISPOSITION:  # HOLD venofer  # Follow-up in 6  months-;MD; cbc/cmp;iron studies;ferritin;  thyoid panel ;possible venofer  Dr.B  # I reviewed the blood work- with the patient in detail; also reviewed the imaging independently [as  summarized above]; and with the patient in detail.     All questions were answered. The patient knows to call the clinic with any problems, questions or concerns.    Cammie Sickle, MD 09/12/2022 1:27 PM

## 2022-09-12 NOTE — Progress Notes (Signed)
Pt has some lightheadedness.Has some fatigue. Dyspnea with exertion. Chronic low back pain. Appetite is good. No visible blood in stool.

## 2022-09-12 NOTE — Assessment & Plan Note (Addendum)
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   Stage IV- MARCH 26 th, 2024- No significant interval change; No developing new mass lesion, fluid collection or lymph node enlargement.  Last immunotherapy infusion January 2022.  Continue surveillance at this time.  Plan imaging only if patient is symptomatic.  Discuss with family.  # History of local recurrent bladder ca < 1cm agree with TURBT [march 2022] [Dr.Stoiof-cystoscopy NOV 2023]- s/p evaluation with urology-negative for recurrence stable.   # Pulmonary: SEP 2022- CT-emphysema /bronchial thickening /bilateral basilar groundglass opacities -?  Resolving  BOOP/ fibrosis Vs. fungal infection [s/p itraconazole; Dr.Aleskerov]; pleural plaques noted-CT scan March 2024 stable.     # Iatrogenic hypothyroidism- on Synthroid; 100 mcg wll repeat today-awaiting labs today. Stable.   # Anemia sec to CKD-III-IV/ on Iv iron.Hb-11-12-Clinically- Stable. ; Hold Venofer today.  # CKD stageIII-IV GFR-24 -Clinically  Stable. recommend increase fluid intake.  #Weight loss improving; continue prednisone for appetite/also above pneumonitis- 5mg  /day.   # IV access: NO port/PIV  #Incidental findings on Imaging  CT , MARCH 2024: Pleural plaques atherosclerosis emphysema; prostatomegaly; bladder thickening -I reviewed/discussed/counseled the patient.   mychart-Thyroid  # DISPOSITION:  # HOLD venofer  # Follow-up in 6  months-;MD; cbc/cmp;iron studies;ferritin;  thyoid panel ;possible venofer  Dr.B  # I reviewed the blood work- with the patient in detail; also reviewed the imaging independently [as summarized above]; and with the patient in detail.

## 2022-09-13 LAB — THYROID PANEL WITH TSH
Free Thyroxine Index: 1.8 (ref 1.2–4.9)
T3 Uptake Ratio: 29 % (ref 24–39)
T4, Total: 6.2 ug/dL (ref 4.5–12.0)
TSH: 0.834 u[IU]/mL (ref 0.450–4.500)

## 2022-10-04 ENCOUNTER — Other Ambulatory Visit: Payer: Self-pay

## 2022-10-04 ENCOUNTER — Emergency Department: Payer: Medicare HMO

## 2022-10-04 ENCOUNTER — Emergency Department
Admission: EM | Admit: 2022-10-04 | Discharge: 2022-10-04 | Disposition: A | Discharge status: Home / Self Care | Payer: Medicare HMO | Attending: Emergency Medicine | Admitting: Emergency Medicine

## 2022-10-04 ENCOUNTER — Encounter: Payer: Self-pay | Admitting: Emergency Medicine

## 2022-10-04 DIAGNOSIS — S6992XA Unspecified injury of left wrist, hand and finger(s), initial encounter: Secondary | ICD-10-CM | POA: Diagnosis not present

## 2022-10-04 DIAGNOSIS — N189 Chronic kidney disease, unspecified: Secondary | ICD-10-CM | POA: Insufficient documentation

## 2022-10-04 DIAGNOSIS — W268XXA Contact with other sharp object(s), not elsewhere classified, initial encounter: Secondary | ICD-10-CM | POA: Insufficient documentation

## 2022-10-04 DIAGNOSIS — I129 Hypertensive chronic kidney disease with stage 1 through stage 4 chronic kidney disease, or unspecified chronic kidney disease: Secondary | ICD-10-CM | POA: Insufficient documentation

## 2022-10-04 DIAGNOSIS — S61412A Laceration without foreign body of left hand, initial encounter: Secondary | ICD-10-CM | POA: Insufficient documentation

## 2022-10-04 DIAGNOSIS — E039 Hypothyroidism, unspecified: Secondary | ICD-10-CM | POA: Insufficient documentation

## 2022-10-04 DIAGNOSIS — S61422A Laceration with foreign body of left hand, initial encounter: Secondary | ICD-10-CM | POA: Diagnosis not present

## 2022-10-04 MED ORDER — CEPHALEXIN 500 MG PO CAPS: 500.0000 mg | ORAL_CAPSULE | Freq: Three times a day (TID) | ORAL | 0 refills | Status: DC

## 2022-10-04 MED ORDER — LIDOCAINE HCL (PF) 1 % IJ SOLN
10.0000 mL | Freq: Once | INTRAMUSCULAR | Status: AC
  Administered 2022-10-04: 10 mL
  Filled 2022-10-04: qty 10

## 2022-10-04 NOTE — ED Triage Notes (Signed)
Patient to ED via POV after "mashing" left hand while working on a car. Laceration noted with bleeding controlled.

## 2022-10-04 NOTE — ED Provider Notes (Signed)
[facility] Provider Note    Event Date/Time   First MD Initiated Contact with Patient 10/04/22 1110     (approximate)   History   Laceration   HPI  Edward Cummings is a 89 y.o. male   presents to the ED with laceration to his left hand that occurred 30 minutes prior to arrival.  Patient states that he was putting a wiper blade on a vehicle when he "smashed it" and then pulled his hand out from underneath causing laceration.  Patient reports that his immunizations are up-to-date within the last 5 years.  Patient has a history of hypertension, chronic kidney disease, hypothyroidism, anemia, carcinoma of the bladder.      Physical Exam   Triage Vital Signs: ED Triage Vitals  Enc Vitals Group     BP 10/04/22 1103 (!) 119/50     Pulse Rate 10/04/22 1103 72     Resp 10/04/22 1103 18     Temp 10/04/22 1103 98.3 F (36.8 C)     Temp Source 10/04/22 1103 Oral     SpO2 10/04/22 1103 99 %     Weight --      Height --      Head Circumference --      Peak Flow --      Pain Score 10/04/22 1104 1     Pain Loc --      Pain Edu? --      Excl. in GC? --     Most recent vital signs: Vitals:   10/04/22 1103  BP: (!) 119/50  Pulse: 72  Resp: 18  Temp: 98.3 F (36.8 C)  SpO2: 99%     General: Awake, no distress.  Alert, talkative. CV:  Good peripheral perfusion.  Capillary refills less than 3 seconds. Resp:  Normal effort.  Abd:  No distention.  Other:  Left hand with a 8.5 cm laceration extending from the base of the index finger dorsal aspect around to the volar aspect.  Patient is able to move index and thumb without any difficulty or restriction.  Motor or sensory function intact.  No active bleeding at this time.  Small laceration noted on the dorsal aspect measuring 1.5 cm without active bleeding or foreign body noted.  The last laceration measures proximately 3 cm and is more of a flap laceration.  Bleeding is controlled with direct pressure.  No  foreign body noted.  Patient is able to move all digits and most sensory function intact.  There is 1 superficial skin tear present.   ED Results / Procedures / Treatments   Labs (all labs ordered are listed, but only abnormal results are displayed) Labs Reviewed - No data to display   RADIOLOGY  Left hand x-ray images were reviewed and interpreted by myself independent of the radiologist.  There are 4 metallic small foreign bodies noted but did not appear to be in correlation with patient's lacerations.  No fractures noted.   PROCEDURES:  Critical Care performed:   Marland KitchenMarland KitchenLaceration Repair  Date/Time: 10/04/2022 12:04 PM  Performed by: Tommi Rumps, PA-C Authorized by: Tommi Rumps, PA-C   Consent:    Consent obtained:  Verbal   Consent given by:  Patient   Risks discussed:  Pain, infection and poor wound healing Universal protocol:    Patient identity confirmed:  Verbally with patient Anesthesia:    Anesthesia method:  Local infiltration   Local anesthetic:  Lidocaine 1% w/o epi Laceration details:  Location:  Hand   Hand location:  L hand, dorsum   Length (cm):  8.5 Pre-procedure details:    Preparation:  Imaging obtained to evaluate for foreign bodies and patient was prepped and draped in usual sterile fashion Exploration:    Limited defect created (wound extended): no     Hemostasis achieved with:  Direct pressure   Imaging obtained: x-ray     Imaging outcome: foreign body noted     Wound exploration: wound explored through full range of motion     Contaminated: no   Treatment:    Area cleansed with:  Saline and povidone-iodine   Amount of cleaning:  Extensive   Irrigation solution:  Sterile saline   Irrigation volume:  100 ml   Irrigation method:  Syringe   Visualized foreign bodies/material removed: no     Debridement:  Minimal Skin repair:    Repair method:  Sutures   Suture size:  4-0   Suture material:  Nylon   Suture technique:  Simple  interrupted   Number of sutures:  14 Approximation:    Approximation:  Loose Repair type:    Repair type:  Intermediate Post-procedure details:    Dressing:  Non-adherent dressing   Procedure completion:  Tolerated well, no immediate complications Comments:     Refer to x-ray image report.  Laceration does not involve the tendon nor was it visible. Marland Kitchen.Laceration Repair  Date/Time: 10/04/2022 1:43 PM  Performed by: Tommi Rumps, PA-C Authorized by: Tommi Rumps, PA-C   Consent:    Consent obtained:  Verbal   Consent given by:  Patient   Risks discussed:  Infection, poor cosmetic result and poor wound healing Universal protocol:    Patient identity confirmed:  Verbally with patient Anesthesia:    Anesthesia method:  Local infiltration   Local anesthetic:  Lidocaine 1% w/o epi Laceration details:    Location:  Hand   Hand location:  L palm   Length (cm):  1.5 Pre-procedure details:    Preparation:  Patient was prepped and draped in usual sterile fashion Exploration:    Hemostasis achieved with:  Direct pressure   Contaminated: no   Treatment:    Area cleansed with:  Saline and povidone-iodine   Amount of cleaning:  Extensive   Irrigation solution:  Sterile saline   Irrigation volume:  20 ml   Irrigation method:  Syringe   Visualized foreign bodies/material removed: no   Skin repair:    Repair method:  Sutures   Suture size:  4-0   Suture material:  Nylon   Suture technique:  Simple interrupted   Number of sutures:  2 Approximation:    Approximation:  Close Repair type:    Repair type:  Simple Post-procedure details:    Dressing:  Non-adherent dressing   Procedure completion:  Tolerated well, no immediate complications .Marland KitchenLaceration Repair  Date/Time: 10/04/2022 1:45 PM  Performed by: Tommi Rumps, PA-C Authorized by: Tommi Rumps, PA-C   Consent:    Consent obtained:  Verbal   Consent given by:  Patient   Risks discussed:  Infection, poor  cosmetic result and poor wound healing Anesthesia:    Anesthesia method:  Local infiltration   Local anesthetic:  Lidocaine 1% w/o epi Laceration details:    Location:  Hand   Hand location:  L palm   Length (cm):  3 Pre-procedure details:    Preparation:  Patient was prepped and draped in usual sterile fashion Exploration:  Limited defect created (wound extended): no     Hemostasis achieved with:  Direct pressure   Contaminated: no   Treatment:    Area cleansed with:  Povidone-iodine and saline   Amount of cleaning:  Standard   Irrigation solution:  Sterile saline   Irrigation volume:  20 ml   Irrigation method:  Syringe   Visualized foreign bodies/material removed: no     Debridement:  None Skin repair:    Repair method:  Sutures   Suture size:  4-0   Suture material:  Nylon   Suture technique:  Simple interrupted   Number of sutures:  4 Approximation:    Approximation:  Loose    MEDICATIONS ORDERED IN ED: Medications  lidocaine (PF) (XYLOCAINE) 1 % injection 10 mL (10 mLs Infiltration Given 10/04/22 1208)     IMPRESSION / MDM / ASSESSMENT AND PLAN / ED COURSE  I reviewed the triage vital signs and the nursing notes.   Differential diagnosis includes, but is not limited to, left hand fracture, contusion, multiple lacerations, tendon injury.  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  X-rays were negative for fracture but did show 4 metallic bodies that did not appear to be in relationship to patient's lacerations.  Lacerations were explored and numerous irrigations without foreign body noted.  Patient is aware that he needs to keep the area clean and dry and watch for any signs of infection.  A prescription for Keflex 500 mg 3 times daily for 5 days was sent to his pharmacy to prevent infection.  He is instructed to follow-up with his PCP, urgent care or  return to the emergency department in 12 to 14 days to have these removed and sooner if there is any concerns for infection.      Patient's presentation is most consistent with acute complicated illness / injury requiring diagnostic workup.  FINAL CLINICAL IMPRESSION(S) / ED DIAGNOSES   Final diagnoses:  Laceration of left hand without foreign body, initial encounter     Rx / DC Orders   ED Discharge Orders          Ordered    cephALEXin (KEFLEX) 500 MG capsule  3 times daily        10/04/22 1348             Note:  This document was prepared using Dragon voice recognition software and may include unintentional dictation errors.   Tommi Rumps, PA-C 10/04/22 1406    Georga Hacking, MD 10/04/22 [phone]

## 2022-10-04 NOTE — Discharge Instructions (Signed)
Keep your left hand clean and dry and watch for any signs of infection.  You may clean your hand daily with mild soap and water but allowed to dry completely before covering it again.  If you see any signs of infection such as redness, increased pain or drainage from the area return to the emergency department.  A prescription for an antibiotic was sent to your pharmacy to take for the next 5 days to help prevent an infection.  In approximately 12 to 14 days have sutures removed either at your primary care provider's office, urgent care or return to the emergency department for suture removal.

## 2022-10-04 NOTE — ED Notes (Signed)
Lacerations to left hand, no bleeding noted at this time. Cap refill <3secs

## 2022-10-09 ENCOUNTER — Telehealth: Payer: Self-pay

## 2022-10-09 NOTE — Telephone Encounter (Signed)
Transition Care Management Follow-up Telephone Call Date of discharge and from where: 10/04/2022 How have you been since you were released from the hospital? DOING GOOD  Any questions or concerns? No  Items Reviewed: Did the pt receive and understand the discharge instructions provided? Yes  Medications obtained and verified? Yes  Other? No  Any new allergies since your discharge? No  Dietary orders reviewed? No Do you have support at home? Yes    Follow up appointments reviewed:  PCP Hospital f/u appt confirmed? Yes  Scheduled to see Corky Downs on today @ . Specialist Hospital f/u appt confirmed? No  Scheduled to see  on  @ . Are transportation arrangements needed? No  If their condition worsens, is the pt aware to call PCP or go to the Emergency Dept.? Yes Was the patient provided with contact information for the PCP's office or ED? Yes Was to pt encouraged to call back with questions or concerns? Yes

## 2022-10-17 ENCOUNTER — Inpatient Hospital Stay
Admission: EM | Admit: 2022-10-17 | Discharge: 2022-10-19 | DRG: 871 | Disposition: A | Discharge status: Home / Self Care | Payer: Medicare HMO | Attending: Internal Medicine | Admitting: Internal Medicine

## 2022-10-17 ENCOUNTER — Other Ambulatory Visit: Payer: Self-pay

## 2022-10-17 ENCOUNTER — Emergency Department: Payer: Medicare HMO

## 2022-10-17 DIAGNOSIS — Z66 Do not resuscitate: Secondary | ICD-10-CM | POA: Diagnosis present

## 2022-10-17 DIAGNOSIS — R6520 Severe sepsis without septic shock: Secondary | ICD-10-CM | POA: Diagnosis not present

## 2022-10-17 DIAGNOSIS — J189 Pneumonia, unspecified organism: Secondary | ICD-10-CM | POA: Diagnosis not present

## 2022-10-17 DIAGNOSIS — D631 Anemia in chronic kidney disease: Secondary | ICD-10-CM | POA: Diagnosis present

## 2022-10-17 DIAGNOSIS — T380X5A Adverse effect of glucocorticoids and synthetic analogues, initial encounter: Secondary | ICD-10-CM | POA: Diagnosis present

## 2022-10-17 DIAGNOSIS — J439 Emphysema, unspecified: Secondary | ICD-10-CM | POA: Diagnosis present

## 2022-10-17 DIAGNOSIS — N184 Chronic kidney disease, stage 4 (severe): Secondary | ICD-10-CM | POA: Diagnosis present

## 2022-10-17 DIAGNOSIS — F418 Other specified anxiety disorders: Secondary | ICD-10-CM | POA: Diagnosis present

## 2022-10-17 DIAGNOSIS — R000 Tachycardia, unspecified: Secondary | ICD-10-CM | POA: Diagnosis not present

## 2022-10-17 DIAGNOSIS — Z7982 Long term (current) use of aspirin: Secondary | ICD-10-CM | POA: Diagnosis not present

## 2022-10-17 DIAGNOSIS — Z7952 Long term (current) use of systemic steroids: Secondary | ICD-10-CM | POA: Diagnosis not present

## 2022-10-17 DIAGNOSIS — E039 Hypothyroidism, unspecified: Secondary | ICD-10-CM | POA: Diagnosis present

## 2022-10-17 DIAGNOSIS — D649 Anemia, unspecified: Secondary | ICD-10-CM | POA: Diagnosis present

## 2022-10-17 DIAGNOSIS — R0602 Shortness of breath: Secondary | ICD-10-CM | POA: Diagnosis not present

## 2022-10-17 DIAGNOSIS — I129 Hypertensive chronic kidney disease with stage 1 through stage 4 chronic kidney disease, or unspecified chronic kidney disease: Secondary | ICD-10-CM | POA: Diagnosis not present

## 2022-10-17 DIAGNOSIS — D638 Anemia in other chronic diseases classified elsewhere: Secondary | ICD-10-CM | POA: Diagnosis not present

## 2022-10-17 DIAGNOSIS — Z79890 Hormone replacement therapy (postmenopausal): Secondary | ICD-10-CM

## 2022-10-17 DIAGNOSIS — Z8551 Personal history of malignant neoplasm of bladder: Secondary | ICD-10-CM | POA: Diagnosis not present

## 2022-10-17 DIAGNOSIS — H02402 Unspecified ptosis of left eyelid: Secondary | ICD-10-CM | POA: Diagnosis not present

## 2022-10-17 DIAGNOSIS — A419 Sepsis, unspecified organism: Secondary | ICD-10-CM | POA: Diagnosis not present

## 2022-10-17 DIAGNOSIS — Z87891 Personal history of nicotine dependence: Secondary | ICD-10-CM

## 2022-10-17 DIAGNOSIS — I10 Essential (primary) hypertension: Secondary | ICD-10-CM | POA: Diagnosis not present

## 2022-10-17 DIAGNOSIS — R29810 Facial weakness: Secondary | ICD-10-CM | POA: Diagnosis present

## 2022-10-17 DIAGNOSIS — Z1152 Encounter for screening for COVID-19: Secondary | ICD-10-CM

## 2022-10-17 DIAGNOSIS — Z79899 Other long term (current) drug therapy: Secondary | ICD-10-CM | POA: Diagnosis not present

## 2022-10-17 DIAGNOSIS — K449 Diaphragmatic hernia without obstruction or gangrene: Secondary | ICD-10-CM | POA: Diagnosis present

## 2022-10-17 DIAGNOSIS — I69392 Facial weakness following cerebral infarction: Secondary | ICD-10-CM | POA: Diagnosis not present

## 2022-10-17 LAB — CBC WITH DIFFERENTIAL/PLATELET
Abs Immature Granulocytes: 0.05 10*3/uL (ref 0.00–0.07)
Basophils Absolute: 0.1 10*3/uL (ref 0.0–0.1)
Basophils Relative: 0 %
Eosinophils Absolute: 0.4 10*3/uL (ref 0.0–0.5)
Eosinophils Relative: 3 %
HCT: 31.7 % — ABNORMAL LOW (ref 39.0–52.0)
Hemoglobin: 10.5 g/dL — ABNORMAL LOW (ref 13.0–17.0)
Immature Granulocytes: 0 %
Lymphocytes Relative: 20 %
Lymphs Abs: 2.7 10*3/uL (ref 0.7–4.0)
MCH: 33 pg (ref 26.0–34.0)
MCHC: 33.1 g/dL (ref 30.0–36.0)
MCV: 99.7 fL (ref 80.0–100.0)
Monocytes Absolute: 0.4 10*3/uL (ref 0.1–1.0)
Monocytes Relative: 3 %
Neutro Abs: 10.1 10*3/uL — ABNORMAL HIGH (ref 1.7–7.7)
Neutrophils Relative %: 74 %
Platelets: 250 10*3/uL (ref 150–400)
RBC: 3.18 MIL/uL — ABNORMAL LOW (ref 4.22–5.81)
RDW: 12.7 % (ref 11.5–15.5)
WBC: 13.7 10*3/uL — ABNORMAL HIGH (ref 4.0–10.5)
nRBC: 0 % (ref 0.0–0.2)

## 2022-10-17 LAB — COMPREHENSIVE METABOLIC PANEL
ALT: 17 U/L (ref 0–44)
AST: 27 U/L (ref 15–41)
Albumin: 3.8 g/dL (ref 3.5–5.0)
Alkaline Phosphatase: 53 U/L (ref 38–126)
Anion gap: 9 (ref 5–15)
BUN: 47 mg/dL — ABNORMAL HIGH (ref 8–23)
CO2: 20 mmol/L — ABNORMAL LOW (ref 22–32)
Calcium: 8.8 mg/dL — ABNORMAL LOW (ref 8.9–10.3)
Chloride: 111 mmol/L (ref 98–111)
Creatinine, Ser: 2.96 mg/dL — ABNORMAL HIGH (ref 0.61–1.24)
GFR, Estimated: 20 mL/min — ABNORMAL LOW (ref >60)
Glucose, Bld: 99 mg/dL (ref 70–99)
Potassium: 4.6 mmol/L (ref 3.5–5.1)
Sodium: 140 mmol/L (ref 135–145)
Total Bilirubin: 0.5 mg/dL (ref 0.3–1.2)
Total Protein: 6.5 g/dL (ref 6.5–8.1)

## 2022-10-17 LAB — URINALYSIS, ROUTINE W REFLEX MICROSCOPIC
Bilirubin Urine: NEGATIVE
Glucose, UA: NEGATIVE mg/dL
Hgb urine dipstick: NEGATIVE
Ketones, ur: NEGATIVE mg/dL
Leukocytes,Ua: NEGATIVE
Nitrite: NEGATIVE
Protein, ur: NEGATIVE mg/dL
Specific Gravity, Urine: 1.015 (ref 1.005–1.030)
pH: 5 (ref 5.0–8.0)

## 2022-10-17 LAB — LACTIC ACID, PLASMA
Lactic Acid, Venous: 1.4 mmol/L (ref 0.5–1.9)
Lactic Acid, Venous: 1.1 mmol/L (ref 0.5–1.9)

## 2022-10-17 LAB — STREP PNEUMONIAE URINARY ANTIGEN: Strep Pneumo Urinary Antigen: NEGATIVE

## 2022-10-17 LAB — SARS CORONAVIRUS 2 BY RT PCR: SARS Coronavirus 2 by RT PCR: NEGATIVE

## 2022-10-17 LAB — TROPONIN I (HIGH SENSITIVITY)
Troponin I (High Sensitivity): 11 ng/L (ref <18)
Troponin I (High Sensitivity): 12 ng/L (ref <18)

## 2022-10-17 LAB — PROCALCITONIN: Procalcitonin: 0.58 ng/mL

## 2022-10-17 MED ORDER — SODIUM CHLORIDE 0.9 % IV SOLN
500.0000 mg | INTRAVENOUS | Status: DC
  Administered 2022-10-17 – 2022-10-18 (×2): 500 mg via INTRAVENOUS
  Filled 2022-10-17 (×2): qty 5

## 2022-10-17 MED ORDER — ALBUTEROL SULFATE (2.5 MG/3ML) 0.083% IN NEBU: 2.5000 mg | INHALATION_SOLUTION | RESPIRATORY_TRACT | Status: DC | PRN

## 2022-10-17 MED ORDER — ACETAMINOPHEN 325 MG PO TABS: 650.0000 mg | ORAL_TABLET | Freq: Four times a day (QID) | ORAL | Status: DC | PRN

## 2022-10-17 MED ORDER — PREDNISONE 10 MG PO TABS
5.0000 mg | ORAL_TABLET | Freq: Every day | ORAL | Status: DC
  Administered 2022-10-17 – 2022-10-18 (×2): 5 mg via ORAL
  Filled 2022-10-17 (×2): qty 1

## 2022-10-17 MED ORDER — SODIUM CHLORIDE 0.9 % IV SOLN
2.0000 g | INTRAVENOUS | Status: DC
  Administered 2022-10-17 – 2022-10-19 (×3): 2 g via INTRAVENOUS
  Filled 2022-10-17 (×3): qty 20

## 2022-10-17 MED ORDER — LACTATED RINGERS IV SOLN: INTRAVENOUS | Status: DC

## 2022-10-17 MED ORDER — ADULT MULTIVITAMIN W/MINERALS CH
1.0000 | ORAL_TABLET | Freq: Every day | ORAL | Status: DC
  Administered 2022-10-17 – 2022-10-19 (×3): 1 via ORAL
  Filled 2022-10-17 (×3): qty 1

## 2022-10-17 MED ORDER — HYDRALAZINE HCL 20 MG/ML IJ SOLN: 5.0000 mg | INTRAMUSCULAR | Status: DC | PRN

## 2022-10-17 MED ORDER — LEVOTHYROXINE SODIUM 50 MCG PO TABS
75.0000 ug | ORAL_TABLET | Freq: Every day | ORAL | Status: DC
  Administered 2022-10-17 – 2022-10-19 (×3): 75 ug via ORAL
  Filled 2022-10-17: qty 2
  Filled 2022-10-17 (×2): qty 1

## 2022-10-17 MED ORDER — DM-GUAIFENESIN ER 30-600 MG PO TB12: 1.0000 | ORAL_TABLET | Freq: Two times a day (BID) | ORAL | Status: DC | PRN

## 2022-10-17 MED ORDER — ASPIRIN 81 MG PO TBEC
81.0000 mg | DELAYED_RELEASE_TABLET | Freq: Every day | ORAL | Status: DC
  Administered 2022-10-17 – 2022-10-19 (×3): 81 mg via ORAL
  Filled 2022-10-17 (×3): qty 1

## 2022-10-17 MED ORDER — LACTATED RINGERS IV BOLUS
1000.0000 mL | Freq: Once | INTRAVENOUS | Status: AC
  Administered 2022-10-17: 1000 mL via INTRAVENOUS

## 2022-10-17 MED ORDER — ONDANSETRON HCL 4 MG/2ML IJ SOLN: 4.0000 mg | Freq: Three times a day (TID) | INTRAMUSCULAR | Status: DC | PRN

## 2022-10-17 MED ORDER — HEPARIN SODIUM (PORCINE) 5000 UNIT/ML IJ SOLN
5000.0000 [IU] | Freq: Three times a day (TID) | INTRAMUSCULAR | Status: DC
  Administered 2022-10-17 – 2022-10-19 (×7): 5000 [IU] via SUBCUTANEOUS
  Filled 2022-10-17 (×7): qty 1

## 2022-10-17 NOTE — Sepsis Progress Note (Signed)
Elink monitoring for the code sepsis protocol.  

## 2022-10-17 NOTE — ED Triage Notes (Signed)
Ambulatory to triage with c/o tremors, right lower back pain, bilateral hip pain. Pt also c/o sob at home and needing to use inhaler.  Denies urinary sx. Denies any injuries or strenuous activity.

## 2022-10-17 NOTE — ED Notes (Signed)
No repeat lactic as pt had a normal first lactic

## 2022-10-17 NOTE — H&P (Signed)
History and Physical    Edward Cummings:[id number] DOB: 01-02-1933 DOA: 10/17/2022  Referring MD/NP/PA:   PCP: Corky Downs, MD   Patient coming from:  The patient is coming from home.     Chief Complaint: Cough  HPI: Edward Cummings is a 89 y.o. male with medical history significant of hypertension, hypothyroidism, depression with anxiety, CKD-4, anemia, bladder cancer, chronic right facial droop and left eye proptosis, who presents with cough.  Patient denies any symptoms to me, but per his son (I called his son by phone), patient is very forgetful, but no diagnosis of dementia yet.  Per his son, patient has dry cough in the past several days, with mild shortness of breath.  No chest pain. Pt has chills, no fever. Patient does not have nausea vomiting, diarrhea or abdominal pain.  No symptoms of UTI.  His son states that patient recently has poor appetite, and was started on low-dose prednisone 5 mg daily for purpose of gaining weight.  He also had left hand injury and finished a course of Keflex recently.  During the interview, I noticed that the patient has right facial droop and left eye ptosis.  Per his son, this is chronic issue, has been going on for many years.  Patient does not have unilateral numbness or tinglings in extremities with no facial droop or slurred speech.  Data reviewed independently and ED Course: pt was found to have WBC 13.7, lactic acid 1.4, procalcitonin 0.58, negative PCR for COVID, worsening renal function, temperature 97.5, blood pressure 118/59, heart rate 90, RR 28, oxygen saturation 96% on room air.  Chest x-ray showed right perihilar infiltration.  Patient is admitted to telemetry bed as inpatient.   EKG: I have personally reviewed.  Sinus rhythm, QTc 400, heart rate 103, low voltage, early R wave progression, borderline left axis deviation.   Review of Systems:   General: no fevers, has chills, no body weight gain, fatigue HEENT: no blurry vision,  hearing changes or sore throat Respiratory: has dyspnea, coughing, no wheezing CV: no chest pain, no palpitations GI: no nausea, vomiting, abdominal pain, diarrhea, constipation GU: no dysuria, burning on urination, increased urinary frequency, hematuria  Ext: no leg edema Neuro: no vision change or hearing loss. Has forgetfulness.  Has right facial droop and left eye proptosis Skin: no rash MSK: No muscle spasm, no deformity, no limitation of range of movement in spin Heme: No easy bruising.  Travel history: No recent long distant travel.   Allergy: No Known Allergies  Past Medical History:  Diagnosis Date   Anemia    Anxiety 12/31/2019   Cancer [facility])    bladder   Chronic kidney disease    Depression    Hypertension    Neuromuscular disorder (HCC)    Nerve damage to left face/eye since around 2002.    Past Surgical History:  Procedure Laterality Date   CYSTOSCOPY W/ RETROGRADES Bilateral 01/25/2018   Procedure: CYSTOSCOPY WITH RETROGRADE PYELOGRAM;  Surgeon: Riki Altes, MD;  [name];  Service: Urology;  Laterality: Bilateral;   CYSTOSCOPY W/ URETERAL STENT PLACEMENT Bilateral 01/06/2019   Procedure: CYSTOSCOPY WITH RETROGRADE PYELOGRAM/URETERAL STENT REMOVAL;  Surgeon: Riki Altes, MD;  [name];  Service: Urology;  Laterality: Bilateral;   CYSTOSCOPY WITH BIOPSY N/A 09/21/2020   Procedure: CYSTOSCOPY WITH BLADDER BIOPSY;  Surgeon: Riki Altes, MD;  [name];  Service: Urology;  [name];   CYSTOSCOPY WITH FULGERATION N/A 09/21/2020   Procedure:  CYSTOSCOPY WITH FULGERATION;  Surgeon: Riki Altes, MD;  [name];  Service: Urology;  [name];   CYSTOSCOPY WITH STENT PLACEMENT Bilateral 01/25/2018   Procedure: CYSTOSCOPY WITH STENT PLACEMENT;  Surgeon: Riki Altes, MD;  [name];  Service: Urology;  Laterality: Bilateral;   DORSAL SLIT N/A 01/06/2019   Procedure: DORSAL SLIT;  Surgeon: Riki Altes, MD;  [name];  Service: Urology;  [name];   ESOPHAGOGASTRODUODENOSCOPY (EGD) WITH PROPOFOL N/A 11/12/2019   Procedure: ESOPHAGOGASTRODUODENOSCOPY (EGD) WITH PROPOFOL;  Surgeon: Pasty Spillers, MD;  Location: [facility];  Service: Endoscopy;  [name];   EYE SURGERY     Cornea transplants bilaterally & cataract surgery.   TRANSURETHRAL RESECTION OF BLADDER TUMOR N/A 01/25/2018   Procedure: TRANSURETHRAL RESECTION OF BLADDER TUMOR (TURBT);  Surgeon: Riki Altes, MD;  [name];  Service: Urology;  [name];    Social History:  reports that he quit smoking about 14 years ago. His smoking use included cigarettes. His smokeless tobacco use includes chew. He reports current alcohol use of about 2.0 standard drinks of alcohol per week. He reports that he does not use drugs.  Family History:  Family History  Problem Relation Age of Onset   Prostate cancer Neg Hx    Kidney cancer Neg Hx    Bladder Cancer Neg Hx      Prior to Admission medications   Medication Sig Start Date End Date Taking? Authorizing Provider  amLODipine (NORVASC) 10 MG tablet TAKE ONE TABLET BY MOUTH ONCE DAILY 08/25/22  Yes Kara Dies, NP  aspirin EC 81 MG tablet Take 81 mg by mouth daily.   Yes [provider]  levothyroxine (SYNTHROID) 75 MCG tablet TAKE 1 TABLET BY MOUTH ONCE A DAY BEFOREBREAKFAST. 06/30/22  Yes Earna Coder, MD  mometasone (NASONEX) 50 MCG/ACT nasal spray PLACE 2 SPRAYS INTO THE NOSE DAILY 01/10/22  Yes Masoud, Renda Rolls, MD  Multiple Vitamin (MULTIVITAMIN WITH MINERALS) TABS tablet Take 1 tablet by mouth daily.    Yes [provider]  prednisoLONE acetate (PRED FORTE) 1 % ophthalmic suspension Place 1 drop into both eyes daily at 2 PM. 10/28/21  Yes Masoud, Renda Rolls, MD  predniSONE (DELTASONE) 5 MG tablet TAKE ONE TABLET BY MOUTH ONCE A DAY WITH BREAKFAST. 08/24/22  Yes Earna Coder, MD  cephALEXin (KEFLEX) 500  MG capsule Take 1 capsule (500 mg total) by mouth 3 (three) times daily. Patient not taking: Reported on 10/17/2022 10/04/22   Tommi Rumps, PA-C  loratadine (CLARITIN) 10 MG tablet TAKE 1 TABLET BY MOUTH ONCE A DAY 12/26/21   Corky Downs, MD    Physical Exam: Vitals:   10/17/22 1330 10/17/22 1400 10/17/22 1430 10/17/22 1820  BP: (!) 141/67 137/60 134/67 125/64  Pulse: 82 77 87 70  Resp: (!) 21 19 17 18   Temp:   99 F (37.2 C) 98 F (36.7 C)  TempSrc:    Oral  SpO2: 95% 98% 96% 99%  Weight:      Height:       General: Not in acute distress HEENT:       Eyes: PERRL, EOMI, no scleral icterus.       ENT: No discharge from the ears and nose, no pharynx injection, no tonsillar enlargement.        Neck: No JVD, no bruit, no mass felt. Heme: No neck lymph node enlargement. Cardiac: S1/S2, RRR, No murmurs, No gallops or rubs. Respiratory:  No rales, wheezing, rhonchi or rubs. GI: Soft, nondistended, nontender, no rebound pain, no organomegaly, BS present. GU: No hematuria Ext: No pitting leg edema bilaterally. 1+DP/PT pulse bilaterally. Musculoskeletal: No joint deformities, No joint redness or warmth, no limitation of ROM in spin. Skin: No rashes.  Neuro: Alert, oriented X3, moves all extremities normally. Has right facial droop and left eye proptosis  Psych: Patient is not psychotic, no suicidal or hemocidal ideation.  Labs on Admission: I have personally reviewed following labs and imaging studies  CBC: Recent Labs  Lab 10/17/22 0347  WBC 13.7*  NEUTROABS 10.1*  HGB 10.5*  HCT 31.7*  MCV 99.7  PLT 250   Basic Metabolic Panel: Recent Labs  Lab 10/17/22 0347  NA 140  K 4.6  CL 111  CO2 20*  GLUCOSE 99  BUN 47*  CREATININE 2.96*  CALCIUM 8.8*   GFR: Estimated Creatinine Clearance: 14.9 mL/min (A) (by C-G formula based on SCr of 2.96 mg/dL (H)). Liver Function Tests: Recent Labs  Lab 10/17/22 0347  AST 27  ALT 17  ALKPHOS 53  BILITOT 0.5  PROT 6.5   ALBUMIN 3.8   No results for input(s): "LIPASE", "AMYLASE" in the last 168 hours. No results for input(s): "AMMONIA" in the last 168 hours. Coagulation Profile: No results for input(s): "INR", "PROTIME" in the last 168 hours. Cardiac Enzymes: No results for input(s): "CKTOTAL", "CKMB", "CKMBINDEX", "TROPONINI" in the last 168 hours. BNP (last 3 results) No results for input(s): "PROBNP" in the last 8760 hours. HbA1C: No results for input(s): "HGBA1C" in the last 72 hours. CBG: No results for input(s): "GLUCAP" in the last 168 hours. Lipid Profile: No results for input(s): "CHOL", "HDL", "LDLCALC", "TRIG", "CHOLHDL", "LDLDIRECT" in the last 72 hours. Thyroid Function Tests: No results for input(s): "TSH", "T4TOTAL", "FREET4", "T3FREE", "THYROIDAB" in the last 72 hours. Anemia Panel: No results for input(s): "VITAMINB12", "FOLATE", "FERRITIN", "TIBC", "IRON", "RETICCTPCT" in the last 72 hours. Urine analysis:    Component Value Date/Time   COLORURINE YELLOW (A) 10/17/2022 0347   APPEARANCEUR CLEAR (A) 10/17/2022 0347   APPEARANCEUR Clear 05/08/2022 1014   LABSPEC 1.015 10/17/2022 0347   PHURINE 5.0 10/17/2022 0347   GLUCOSEU NEGATIVE 10/17/2022 0347   HGBUR NEGATIVE 10/17/2022 0347   BILIRUBINUR NEGATIVE 10/17/2022 0347   BILIRUBINUR Negative 05/08/2022 1014   KETONESUR NEGATIVE 10/17/2022 0347   PROTEINUR NEGATIVE 10/17/2022 0347   NITRITE NEGATIVE 10/17/2022 0347   LEUKOCYTESUR NEGATIVE 10/17/2022 0347   Sepsis Labs: @LABRCNTIP (procalcitonin:4,lacticidven:4) ) Recent Results (from the past 240 hour(s))  SARS Coronavirus 2 by RT PCR (hospital order, performed in [facility] hospital lab) *cepheid single result test* Anterior Nasal Swab     Status: None   Collection Time: 10/17/22  3:47 AM   Specimen: Anterior Nasal Swab  Result Value Ref Range Status   SARS Coronavirus 2 by RT PCR NEGATIVE NEGATIVE Final    Comment: (NOTE) SARS-CoV-2 target nucleic acids are NOT  DETECTED.  The SARS-CoV-2 RNA is generally detectable in upper and lower respiratory specimens during the acute phase of infection. The lowest concentration of SARS-CoV-2 viral copies this assay can detect is 250 copies / mL. A negative result does not preclude SARS-CoV-2 infection and should not be used as the sole basis for treatment or other patient management decisions.  A negative result may occur with improper specimen collection / handling, submission of specimen other than nasopharyngeal swab, presence of viral mutation(s) within the areas targeted by this assay, and inadequate number of  viral copies (<250 copies / mL). A negative result must be combined with clinical observations, patient history, and epidemiological information.  Fact Sheet for Patients:   RoadLapTop.co.za  Fact Sheet for Healthcare Providers: http://kim-miller.com/  This test is not yet approved or  cleared by the [place] FDA and has been authorized for detection and/or diagnosis of SARS-CoV-2 by FDA under an Emergency Use Authorization (EUA).  This EUA will remain in effect (meaning this test can be used) for the duration of the COVID-19 declaration under Section 564(b)(1) of the Act, 21 U.S.C. section 360bbb-3(b)(1), unless the authorization is terminated or revoked sooner.  Performed at [facility], [address] [place], [place] [address]      Radiological Exams on Admission: DG Chest Portable 1 View  Result Date: 10/17/2022 CLINICAL DATA:  Shortness of breath EXAM: PORTABLE CHEST 1 VIEW COMPARISON:  07/01/2020 FINDINGS: Airspace disease in the right perihilar lung. Apical lucency and hyperinflation. Gas-filled structure behind the heart from hiatal hernia. No edema, effusion, or pneumothorax. IMPRESSION: 1. Right perihilar pneumonia. Followup PA and lateral chest X-ray is recommended in 3-4 weeks following trial of antibiotic therapy to  ensure resolution. 2. Emphysema and hiatal hernia. Electronically Signed   By: Tiburcio Pea M.D.   On: 10/17/2022 03:56      Assessment/Plan Principal Problem:   CAP (community acquired pneumonia) Active Problems:   Sepsis (HCC)   Facial droop   Hypertension   Chronic kidney disease (CKD), stage IV (severe) (HCC)   Normocytic anemia   Hypothyroidism   Depression with anxiety   Assessment and Plan:  Sepsis due to CAP (community acquired pneumonia): Patient meets criteria for sepsis with WBC 13.7, RR 28.  Lactic acid is normal.  Procalcitonin 0.58.  - Will admit to tele bed  PCU as inpt  -IV Rocephin and azithromycin - Incentive spirometry - Mucinex for cough  - Bronchodilators - Urine legionella and S. pneumococcal antigen - Follow up blood culture x2, sputum culture - IVF: 1L of NS bolus in ED  Right facial droop: this is chronic issue, likely due to stroke per his son. -No treatment needed  Hypertension -IV hydralazine as needed -Hold on amlodipine since patient at risk of developing hypotension due to sepsis  Chronic kidney disease (CKD), stage IV (severe) (HCC): Renal function slightly worsened than baseline.  Baseline creatinine 2.0-2.5.  His creatinine is 2.96, BUN 47 -Avoid use and renal toxic medications. -IV fluid as above  Normocytic anemia: Hemoglobin 10.5 (11.2 on 09/12/2022), no active bleeding. -Follow-up with CBC  Hypothyroidism -Synthroid  Depression with anxiety -Continue home medications      DVT ppx: SQ Lovenox  Code Status: DNR per his son  Family Communication:   Yes, patient's son  by phone  Disposition Plan:  Anticipate discharge back to previous environment  Consults called:  none  Admission status and Level of care: Telemetry Medical:  as inpt      Dispo: The patient is from: Home              Anticipated d/c is to: Home              Anticipated d/c date is: 2 days              Patient currently is not medically stable to  d/c.    Severity of Illness:  The appropriate patient status for this patient is INPATIENT. Inpatient status is judged to be reasonable and necessary in order to provide the  required intensity of service to ensure the patient's safety. The patient's presenting symptoms, physical exam findings, and initial radiographic and laboratory data in the context of their chronic comorbidities is felt to place them at high risk for further clinical deterioration. Furthermore, it is not anticipated that the patient will be medically stable for discharge from the hospital within 2 midnights of admission.   * I certify that at the point of admission it is my clinical judgment that the patient will require inpatient hospital care spanning beyond 2 midnights from the point of admission due to high intensity of service, high risk for further deterioration and high frequency of surveillance required.*       Date of Service 10/17/2022    Lorretta Harp [facility]   If 7PM-7AM, please contact night-coverage [email or web address] 10/17/2022, 6:42 PM

## 2022-10-17 NOTE — Plan of Care (Signed)

## 2022-10-17 NOTE — ED Provider Notes (Signed)
[facility] Provider Note    Event Date/Time   First MD Initiated Contact with Patient 10/17/22 0602     (approximate)   History   Multiple Complaints   HPI  Edward Cummings is a 89 y.o. male who presents to the ED for evaluation of Multiple Complaints   Patient presents to the ED with his son for evaluation of chills and feeling unwell.  Initially only reports generalized symptoms related to reluctantly admits to a cough over the past few days.  Chills the morning of 5/6 and feeling unwell all day.  No pain, syncope   Physical Exam   Triage Vital Signs: ED Triage Vitals  Enc Vitals Group     BP 10/17/22 0345 (!) 142/47     Pulse Rate 10/17/22 0345 89     Resp 10/17/22 0345 18     Temp 10/17/22 0345 (!) 97.5 F (36.4 C)     Temp Source 10/17/22 0345 Oral     SpO2 10/17/22 0345 97 %     Weight 10/17/22 0340 135 lb (61.2 kg)     Height 10/17/22 0340 5\' 6"  (1.676 m)     Head Circumference --      Peak Flow --      Pain Score 10/17/22 0342 6     Pain Loc --      Pain Edu? --      Excl. in GC? --     Most recent vital signs: Vitals:   10/17/22 0600 10/17/22 0700  BP: 138/64 (!) 118/59  Pulse:  90  Resp: (!) 28 (!) 25  Temp:    SpO2:  96%    General: Awake, no distress.  CV:  Good peripheral perfusion.  Resp:  Normal effort.  Abd:  No distention.  MSK:  No deformity noted.  Neuro:  No focal deficits appreciated. Other:     ED Results / Procedures / Treatments   Labs (all labs ordered are listed, but only abnormal results are displayed) Labs Reviewed  CBC WITH DIFFERENTIAL/PLATELET - Abnormal; Notable for the following components:      Result Value   WBC 13.7 (*)    RBC 3.18 (*)    Hemoglobin 10.5 (*)    HCT 31.7 (*)    Neutro Abs 10.1 (*)    All other components within normal limits  COMPREHENSIVE METABOLIC PANEL - Abnormal; Notable for the following components:   CO2 20 (*)    BUN 47 (*)    Creatinine, Ser 2.96 (*)     Calcium 8.8 (*)    GFR, Estimated 20 (*)    All other components within normal limits  URINALYSIS, ROUTINE W REFLEX MICROSCOPIC - Abnormal; Notable for the following components:   Color, Urine YELLOW (*)    APPearance CLEAR (*)    All other components within normal limits  SARS CORONAVIRUS 2 BY RT PCR  CULTURE, BLOOD (ROUTINE X 2)  CULTURE, BLOOD (ROUTINE X 2)  LACTIC ACID, PLASMA  PROCALCITONIN  LACTIC ACID, PLASMA  TROPONIN I (HIGH SENSITIVITY)  TROPONIN I (HIGH SENSITIVITY)    EKG Protocol EKG.  Sinus tachycardia 323 bpm.  Leftward axis and normal intervals.  Acute signs of acute ischemia.  RADIOLOGY 1 view CXR interpreted by me with a right perihilar pneumonia.  Official radiology report(s): DG Chest Portable 1 View  Result Date: 10/17/2022 CLINICAL DATA:  Shortness of breath EXAM: PORTABLE CHEST 1 VIEW COMPARISON:  07/01/2020 FINDINGS: Airspace disease in the  right perihilar lung. Apical lucency and hyperinflation. Gas-filled structure behind the heart from hiatal hernia. No edema, effusion, or pneumothorax. IMPRESSION: 1. Right perihilar pneumonia. Followup PA and lateral chest X-ray is recommended in 3-4 weeks following trial of antibiotic therapy to ensure resolution. 2. Emphysema and hiatal hernia. Electronically Signed   By: Tiburcio Pea M.D.   On: 10/17/2022 03:56    PROCEDURES and INTERVENTIONS:  .1-3 Lead EKG Interpretation  Performed by: Delton Prairie, MD Authorized by: Delton Prairie, MD     Interpretation: normal     ECG rate:  90   ECG rate assessment: normal     Rhythm: sinus rhythm     Ectopy: none     Conduction: normal   .Critical Care  Performed by: Delton Prairie, MD Authorized by: Delton Prairie, MD   Critical care provider statement:    Critical care time (minutes):  30   Critical care time was exclusive of:  Separately billable procedures and treating other patients   Critical care was necessary to treat or prevent imminent or life-threatening  deterioration of the following conditions:  Sepsis   Critical care was time spent personally by me on the following activities:  Development of treatment plan with patient or surrogate, discussions with consultants, evaluation of patient's response to treatment, examination of patient, ordering and review of laboratory studies, ordering and review of radiographic studies, ordering and performing treatments and interventions, pulse oximetry, re-evaluation of patient's condition and review of old charts   Medications  lactated ringers infusion (has no administration in time range)  cefTRIAXone (ROCEPHIN) 2 g in sodium chloride 0.9 % 100 mL IVPB (2 g Intravenous New Bag/Given 10/17/22 0702)  azithromycin (ZITHROMAX) 500 mg in sodium chloride 0.9 % 250 mL IVPB (has no administration in time range)     IMPRESSION / MDM / ASSESSMENT AND PLAN / ED COURSE  I reviewed the triage vital signs and the nursing notes.  Differential diagnosis includes, but is not limited to, dehydration, AKI, pneumonia, sepsis, UTI  {Patient presents with symptoms of an acute illness or injury that is potentially life-threatening.  89 year old male presents to the ED with chills and generalized symptoms, with evidence of sepsis from pneumonia requiring medical admission.  Looks systemically well but does have vital sign derangements with tachypnea and mild tachycardia.  Remained stable without shock.  Blood work with mild leukocytosis.  CKD around baseline.  CXR confirms the pneumonia is the likely etiology of his symptoms.  Cultures are drawn he is started on antibiotics per sepsis protocols.  Will consult medicine for admission.      FINAL CLINICAL IMPRESSION(S) / ED DIAGNOSES   Final diagnoses:  Community acquired pneumonia of right middle lobe of lung  Sepsis due to pneumonia [facility])     Rx / DC Orders   ED Discharge Orders     None        Note:  This document was prepared using Dragon voice recognition  software and may include unintentional dictation errors.   Delton Prairie, MD 10/17/22 [id number]

## 2022-10-17 NOTE — ED Notes (Signed)
Call to lab to add urine orders from this am to urine that was already sent

## 2022-10-17 NOTE — ED Notes (Signed)
Assumed care from Ellen,RN. Pt resting comfortably in bed at this time. Pt denies any current needs or questions. Call light with in reach.

## 2022-10-17 NOTE — Progress Notes (Signed)
CODE SEPSIS - PHARMACY COMMUNICATION  **Broad Spectrum Antibiotics should be administered within 1 hour of Sepsis diagnosis**  Time Code Sepsis Called/Page Received:  5/7 @ 0626  Antibiotics Ordered: Ceftriaxone 2 gm   Time of 1st antibiotic administration: Ceftriaxone 2 gm IV X 1 on 5/7 @ 0702   Additional action taken by pharmacy:   If necessary, Name of Provider/Nurse Contacted:     Elisabeth Strom D ,PharmD Clinical Pharmacist  10/17/2022  7:04 AM

## 2022-10-18 DIAGNOSIS — I10 Essential (primary) hypertension: Secondary | ICD-10-CM | POA: Diagnosis not present

## 2022-10-18 DIAGNOSIS — F418 Other specified anxiety disorders: Secondary | ICD-10-CM | POA: Diagnosis not present

## 2022-10-18 DIAGNOSIS — R29810 Facial weakness: Secondary | ICD-10-CM

## 2022-10-18 DIAGNOSIS — J189 Pneumonia, unspecified organism: Secondary | ICD-10-CM | POA: Diagnosis not present

## 2022-10-18 DIAGNOSIS — E039 Hypothyroidism, unspecified: Secondary | ICD-10-CM

## 2022-10-18 LAB — BASIC METABOLIC PANEL
Anion gap: 5 (ref 5–15)
BUN: 35 mg/dL — ABNORMAL HIGH (ref 8–23)
CO2: 24 mmol/L (ref 22–32)
Calcium: 8.9 mg/dL (ref 8.9–10.3)
Chloride: 111 mmol/L (ref 98–111)
Creatinine, Ser: 2.11 mg/dL — ABNORMAL HIGH (ref 0.61–1.24)
GFR, Estimated: 30 mL/min — ABNORMAL LOW (ref >60)
Glucose, Bld: 85 mg/dL (ref 70–99)
Potassium: 4.3 mmol/L (ref 3.5–5.1)
Sodium: 140 mmol/L (ref 135–145)

## 2022-10-18 LAB — CBC
HCT: 31.8 % — ABNORMAL LOW (ref 39.0–52.0)
Hemoglobin: 10.5 g/dL — ABNORMAL LOW (ref 13.0–17.0)
MCH: 32.2 pg (ref 26.0–34.0)
MCHC: 33 g/dL (ref 30.0–36.0)
MCV: 97.5 fL (ref 80.0–100.0)
Platelets: 223 10*3/uL (ref 150–400)
RBC: 3.26 MIL/uL — ABNORMAL LOW (ref 4.22–5.81)
RDW: 12.7 % (ref 11.5–15.5)
WBC: 15.5 10*3/uL — ABNORMAL HIGH (ref 4.0–10.5)
nRBC: 0 % (ref 0.0–0.2)

## 2022-10-18 LAB — COMPREHENSIVE METABOLIC PANEL
ALT: 14 U/L (ref 0–44)
AST: 21 U/L (ref 15–41)
Albumin: 3.1 g/dL — ABNORMAL LOW (ref 3.5–5.0)
Alkaline Phosphatase: 45 U/L (ref 38–126)
Anion gap: 4 — ABNORMAL LOW (ref 5–15)
BUN: 34 mg/dL — ABNORMAL HIGH (ref 8–23)
CO2: 22 mmol/L (ref 22–32)
Calcium: 8.7 mg/dL — ABNORMAL LOW (ref 8.9–10.3)
Chloride: 113 mmol/L — ABNORMAL HIGH (ref 98–111)
Creatinine, Ser: 2.05 mg/dL — ABNORMAL HIGH (ref 0.61–1.24)
GFR, Estimated: 31 mL/min — ABNORMAL LOW (ref >60)
Glucose, Bld: 83 mg/dL (ref 70–99)
Potassium: 4.3 mmol/L (ref 3.5–5.1)
Sodium: 139 mmol/L (ref 135–145)
Total Bilirubin: 0.8 mg/dL (ref 0.3–1.2)
Total Protein: 5.7 g/dL — ABNORMAL LOW (ref 6.5–8.1)

## 2022-10-18 LAB — LEGIONELLA PNEUMOPHILA SEROGP 1 UR AG: L. pneumophila Serogp 1 Ur Ag: NEGATIVE

## 2022-10-18 LAB — MAGNESIUM: Magnesium: 2.2 mg/dL (ref 1.7–2.4)

## 2022-10-18 LAB — PHOSPHORUS: Phosphorus: 3.2 mg/dL (ref 2.5–4.6)

## 2022-10-18 LAB — CULTURE, BLOOD (ROUTINE X 2)

## 2022-10-18 MED ORDER — PREDNISONE 20 MG PO TABS
40.0000 mg | ORAL_TABLET | Freq: Every day | ORAL | Status: DC
  Administered 2022-10-19: 40 mg via ORAL
  Filled 2022-10-18: qty 2

## 2022-10-18 MED ORDER — IPRATROPIUM-ALBUTEROL 0.5-2.5 (3) MG/3ML IN SOLN
3.0000 mL | Freq: Four times a day (QID) | RESPIRATORY_TRACT | Status: DC
  Administered 2022-10-18: 3 mL via RESPIRATORY_TRACT
  Filled 2022-10-18: qty 3

## 2022-10-18 MED ORDER — AMLODIPINE BESYLATE 5 MG PO TABS
5.0000 mg | ORAL_TABLET | Freq: Every day | ORAL | Status: DC
  Administered 2022-10-18 – 2022-10-19 (×2): 5 mg via ORAL
  Filled 2022-10-18 (×2): qty 1

## 2022-10-18 MED ORDER — AZITHROMYCIN 500 MG PO TABS
500.0000 mg | ORAL_TABLET | Freq: Every day | ORAL | Status: DC
  Administered 2022-10-19: 500 mg via ORAL
  Filled 2022-10-18: qty 1

## 2022-10-18 MED ORDER — IPRATROPIUM-ALBUTEROL 0.5-2.5 (3) MG/3ML IN SOLN
3.0000 mL | Freq: Two times a day (BID) | RESPIRATORY_TRACT | Status: DC
  Administered 2022-10-19: 3 mL via RESPIRATORY_TRACT
  Filled 2022-10-18: qty 3

## 2022-10-18 NOTE — Progress Notes (Signed)
Mobility Specialist - Progress Note   10/18/22 0855  Mobility  Activity Ambulated independently in hallway;Stood at bedside;Dangled on edge of bed  Level of Assistance Independent  Assistive Device None  Distance Ambulated (ft) 160 ft  Activity Response Tolerated well  Mobility Referral Yes  $Mobility charge 1 Mobility  Mobility Specialist Start Time (ACUTE ONLY) 0845  Mobility Specialist Stop Time (ACUTE ONLY) 0856  Mobility Specialist Time Calculation (min) (ACUTE ONLY) 11 min   Pt supine in bed on RA upon arrival. Pt STS and ambulates in hallway Indep with no LOB noted during session. Pt left in recliner with needs in reach.   Terrilyn Saver  Mobility Specialist  10/18/22 8:57 AM

## 2022-10-18 NOTE — Progress Notes (Signed)
JADAVION FERRETIZ ZOX:[id number] DOB: [birth year] DOA: 10/17/2022 PCP: Corky Downs, MD   Subj: 89 y.o. WM PMHx HTN,  hypothyroidism, depression with anxiety, CKD stage IV, anemia, bladder cancer, chronic right facial droop and left eye proptosis,   Presents with cough.   Patient denies any symptoms to me, but per his son (I called his son by phone), patient is very forgetful, but no diagnosis of dementia yet.  Per his son, patient has dry cough in the past several days, with mild shortness of breath.  No chest pain. Pt has chills, no fever. Patient does not have nausea vomiting, diarrhea or abdominal pain.  No symptoms of UTI.  His son states that patient recently has poor appetite, and was started on low-dose prednisone 5 mg daily for purpose of gaining weight.  He also had left hand injury and finished a course of Keflex recently.  During the interview, I noticed that the patient has right facial droop and left eye ptosis.  Per his son, this is chronic issue, has been going on for many years.  Patient does not have unilateral numbness or tinglings in extremities with no facial droop or slurred speech.   Data reviewed independently and ED Course: pt was found to have WBC 13.7, lactic acid 1.4, procalcitonin 0.58, negative PCR for COVID, worsening renal function, temperature 97.5, blood pressure 118/59, heart rate 90, RR 28, oxygen saturation 96% on room air.  Chest x-ray showed right perihilar infiltration.  Patient is admitted to telemetry bed as inpatient.     EKG: I have personally reviewed.  Sinus rhythm, QTc 400, heart rate 103, low voltage, early R wave progression, borderline left axis deviation.   Obj: A/O x 4, states the reason he came to the hospital was because he felt bad.  Patient could not elaborate further on what his symptoms were.  Negative CP, negative SOB.  Chronic LEFT facial droop.   Objective: VITAL SIGNS: Temp: 98.5 F (36.9 C) (05/08 0525) Temp Source: Oral (05/07  2134) BP: 142/81 (05/08 0525) Pulse Rate: 62 (05/08 0525)   VENTILATOR SETTINGS: **  Procedures/Significant Events: 5/7 PCXR 1. Right perihilar pneumonia. Followup PA and lateral chest X-ray is recommended in 3-4 weeks following trial of antibiotic therapy to ensure resolution. 2. Emphysema and hiatal hernia.  Consultants:     Cultures   Antimicrobials: Anti-infectives (From admission, onward)    Start     Ordered Stop   10/19/22 1000  azithromycin (ZITHROMAX) tablet 500 mg        10/18/22 0831 10/22/22 0959   10/17/22 0630  cefTRIAXone (ROCEPHIN) 2 g in sodium chloride 0.9 % 100 mL IVPB        10/17/22 0626 10/22/22 0629   10/17/22 0630  azithromycin (ZITHROMAX) 500 mg in sodium chloride 0.9 % 250 mL IVPB  Status:  Discontinued        10/17/22 0626 10/18/22 0831        Intake/Output Summary (Last 24 hours) at 10/18/2022 0701 Last data filed at 10/18/2022 0100 Gross per 24 hour  Intake 1979.14 ml  Output 500 ml  Net 1479.14 ml     Exam: Physical Exam:  General: A/O x 4, No acute respiratory distress Eyes: negative scleral hemorrhage, negative anisocoria, negative icterus ENT: Negative Runny nose, negative gingival bleeding, Neck:  Negative scars, masses, torticollis, lymphadenopathy, JVD Lungs: Clear to auscultation bilaterally, mild decreased breath sounds RLL, positive crackles RLL  Cardiovascular: Regular rate and rhythm without murmur gallop or rub normal S1  and S2 Abdomen: negative abdominal pain, nondistended, positive soft, bowel sounds, no rebound, no ascites, no appreciable mass Extremities: No significant cyanosis, clubbing, or edema bilateral lower extremities Skin: Negative rashes, lesions, ulcers Psychiatric:  Negative depression, negative anxiety, negative fatigue, negative mania  Central nervous system:  Cranial nerves II through XII intact, tongue/uvula midline, all extremities muscle strength 5/5, sensation intact throughout, negative dysarthria,  negative expressive aphasia, negative receptive aphasia.  LEFT facial droop (chronic)  .   DVT prophylaxis: Lovenox Code Status: DNR Family Communication:  Status is: Inpatient    Dispo: The patient is from: Home              Anticipated d/c is to: Home              Anticipated d/c date is: 2 days              Patient currently is not medically stable to d/c.      Assessment & Plan: Covid vaccination;   Principal Problem:   CAP (community acquired pneumonia) Active Problems:   Sepsis (HCC)   Facial droop   Hypertension   Chronic kidney disease (CKD), stage IV (severe) (HCC)   Normocytic anemia   Hypothyroidism   Depression with anxiety  Sepsis due to CAP (community acquired pneumonia):  -Patient meets criteria for sepsis with WBC 13.7, RR 28.  Lactic acid is normal.  Procalcitonin 0.58. -Incentive spirometry - Flutter valve - DuoNeb QID - Mucinex DM -5/8 currently leukocytosis, negative fever, negative left shift, negative bands.  Most likely secondary to steroids. -5/8 respiratory virus panel pending   LEFT facial droop (chronic) -Stable:   Hypertension -5/8 Amlodipine 5 mg daily (home dose 10 mg)     Chronic kidney disease (CKD), stage IV (severe) (Baseline creatinine 2.0-2.5.) Lab Results  Component Value Date   CREATININE 2.05 (H) 10/18/2022   CREATININE 2.11 (H) 10/18/2022   CREATININE 2.96 (H) 10/17/2022   CREATININE 2.28 (H) 09/12/2022   CREATININE 2.47 (H) 05/09/2022  -Avoid use and renal toxic medications. -IV fluid as above   Normocytic anemia:(11.2 on 09/12/2022),  -No signs of active bleeding - Anemia panel pending    Hypothyroidism -Synthroid 75 mcg daily - 5/8 TSH pending   Depression with anxiety -Not on any home medication   Mobility Assessment (last 72 hours)     Mobility Assessment     Row Name 10/17/22 1930 10/17/22 1832 10/17/22 1100       Does patient have an order for bedrest or is patient medically unstable No -  Continue assessment No - Continue assessment No - Continue assessment     What is the highest level of mobility based on the progressive mobility assessment? Level 5 (Walks with assist in room/hall) - Balance while stepping forward/back and can walk in room with assist - Complete Level 6 (Walks independently in room and hall) - Balance while walking in room without assist - Complete Level 6 (Walks independently in room and hall) - Balance while walking in room without assist - Complete                   Time: 50 minutes         Care during the described time interval was provided by me .  I have reviewed this patient's available data, including medical history, events of note, physical examination, and all test results as part of my evaluation.

## 2022-10-18 NOTE — Progress Notes (Signed)
Mobility Specialist - Progress Note   10/18/22 1000  Mobility  Activity Ambulated independently in room  Level of Assistance Modified independent, requires aide device or extra time  Assistive Device None  Distance Ambulated (ft) 10 ft  Activity Response Tolerated well  Mobility Referral Yes  $Mobility charge 1 Mobility  Mobility Specialist Start Time (ACUTE ONLY) 0950  Mobility Specialist Stop Time (ACUTE ONLY) 1002  Mobility Specialist Time Calculation (min) (ACUTE ONLY) 12 min   Pt out of chair struggling to navigate IV pole. Pt ambulates from recliner to bed MODI (equipment assistance). Pt left in bed with needs in reach.   Terrilyn Saver  Mobility Specialist  10/18/22 10:06 AM

## 2022-10-18 NOTE — Progress Notes (Signed)
PHARMACIST - PHYSICIAN COMMUNICATION  CONCERNING: Antibiotic IV to Oral Route Change Policy  RECOMMENDATION: This patient is receiving azithromycin by the intravenous route.  Based on criteria approved by the Pharmacy and Therapeutics Committee, the antibiotic(s) is/are being converted to the equivalent oral dose form(s).   DESCRIPTION: These criteria include: Patient being treated for a respiratory tract infection, urinary tract infection, cellulitis or clostridium difficile associated diarrhea if on metronidazole The patient is not neutropenic and does not exhibit a GI malabsorption state The patient is eating (either orally or via tube) and/or has been taking other orally administered medications for a least 24 hours The patient is improving clinically and has a Tmax < 100.5  If you have questions about this conversion, please contact the Pharmacy Department   Tressie Ellis 10/18/22

## 2022-10-19 DIAGNOSIS — I10 Essential (primary) hypertension: Secondary | ICD-10-CM | POA: Diagnosis not present

## 2022-10-19 DIAGNOSIS — D638 Anemia in other chronic diseases classified elsewhere: Secondary | ICD-10-CM

## 2022-10-19 DIAGNOSIS — F418 Other specified anxiety disorders: Secondary | ICD-10-CM | POA: Diagnosis not present

## 2022-10-19 DIAGNOSIS — J189 Pneumonia, unspecified organism: Secondary | ICD-10-CM | POA: Diagnosis not present

## 2022-10-19 DIAGNOSIS — R29810 Facial weakness: Secondary | ICD-10-CM | POA: Diagnosis not present

## 2022-10-19 LAB — RESPIRATORY PANEL BY PCR

## 2022-10-19 LAB — CBC WITH DIFFERENTIAL/PLATELET
Abs Immature Granulocytes: 0.2 10*3/uL — ABNORMAL HIGH (ref 0.00–0.07)
Basophils Absolute: 0 10*3/uL (ref 0.0–0.1)
Basophils Relative: 0 %
Eosinophils Absolute: 0.6 10*3/uL — ABNORMAL HIGH (ref 0.0–0.5)
Eosinophils Relative: 5 %
HCT: 29.2 % — ABNORMAL LOW (ref 39.0–52.0)
Hemoglobin: 9.6 g/dL — ABNORMAL LOW (ref 13.0–17.0)
Immature Granulocytes: 2 %
Lymphocytes Relative: 19 %
Lymphs Abs: 2.3 10*3/uL (ref 0.7–4.0)
MCH: 32.3 pg (ref 26.0–34.0)
MCHC: 32.9 g/dL (ref 30.0–36.0)
MCV: 98.3 fL (ref 80.0–100.0)
Monocytes Absolute: 0.8 10*3/uL (ref 0.1–1.0)
Monocytes Relative: 7 %
Neutro Abs: 7.8 10*3/uL — ABNORMAL HIGH (ref 1.7–7.7)
Neutrophils Relative %: 67 %
Platelets: 212 10*3/uL (ref 150–400)
RBC: 2.97 MIL/uL — ABNORMAL LOW (ref 4.22–5.81)
RDW: 12.6 % (ref 11.5–15.5)
WBC: 11.7 10*3/uL — ABNORMAL HIGH (ref 4.0–10.5)
nRBC: 0 % (ref 0.0–0.2)

## 2022-10-19 LAB — COMPREHENSIVE METABOLIC PANEL
ALT: 14 U/L (ref 0–44)
AST: 22 U/L (ref 15–41)
Albumin: 3.1 g/dL — ABNORMAL LOW (ref 3.5–5.0)
Alkaline Phosphatase: 47 U/L (ref 38–126)
Anion gap: 8 (ref 5–15)
BUN: 28 mg/dL — ABNORMAL HIGH (ref 8–23)
CO2: 22 mmol/L (ref 22–32)
Calcium: 9 mg/dL (ref 8.9–10.3)
Chloride: 107 mmol/L (ref 98–111)
Creatinine, Ser: 2 mg/dL — ABNORMAL HIGH (ref 0.61–1.24)
GFR, Estimated: 32 mL/min — ABNORMAL LOW (ref >60)
Glucose, Bld: 80 mg/dL (ref 70–99)
Potassium: 4.6 mmol/L (ref 3.5–5.1)
Sodium: 137 mmol/L (ref 135–145)
Total Bilirubin: 0.7 mg/dL (ref 0.3–1.2)
Total Protein: 5.8 g/dL — ABNORMAL LOW (ref 6.5–8.1)

## 2022-10-19 LAB — RETICULOCYTES
Immature Retic Fract: 7.1 % (ref 2.3–15.9)
RBC.: 2.96 MIL/uL — ABNORMAL LOW (ref 4.22–5.81)
Retic Count, Absolute: 46.5 10*3/uL (ref 19.0–186.0)
Retic Ct Pct: 1.6 % (ref 0.4–3.1)

## 2022-10-19 LAB — FOLATE: Folate: 20.7 ng/mL (ref >5.9)

## 2022-10-19 LAB — MAGNESIUM: Magnesium: 1.9 mg/dL (ref 1.7–2.4)

## 2022-10-19 LAB — CULTURE, BLOOD (ROUTINE X 2)

## 2022-10-19 LAB — VITAMIN B12: Vitamin B-12: 287 pg/mL (ref 180–914)

## 2022-10-19 LAB — IRON AND TIBC
Iron: 35 ug/dL — ABNORMAL LOW (ref 45–182)
Saturation Ratios: 17 % — ABNORMAL LOW (ref 17.9–39.5)
TIBC: 211 ug/dL — ABNORMAL LOW (ref 250–450)
UIBC: 176 ug/dL

## 2022-10-19 LAB — TSH: TSH: 2.189 u[IU]/mL (ref 0.350–4.500)

## 2022-10-19 LAB — PHOSPHORUS: Phosphorus: 3.1 mg/dL (ref 2.5–4.6)

## 2022-10-19 LAB — FERRITIN: Ferritin: 526 ng/mL — ABNORMAL HIGH (ref 24–336)

## 2022-10-19 MED ORDER — DM-GUAIFENESIN ER 30-600 MG PO TB12: 1.0000 | ORAL_TABLET | Freq: Two times a day (BID) | ORAL | 0 refills | Status: DC | PRN

## 2022-10-19 MED ORDER — AZITHROMYCIN 500 MG PO TABS: 500.0000 mg | ORAL_TABLET | Freq: Every day | ORAL | 0 refills | Status: DC

## 2022-10-19 MED ORDER — CEFDINIR 300 MG PO CAPS: 300.0000 mg | ORAL_CAPSULE | Freq: Two times a day (BID) | ORAL | Status: DC

## 2022-10-19 MED ORDER — ONDANSETRON HCL 4 MG PO TABS: 4.0000 mg | ORAL_TABLET | Freq: Every day | ORAL | 0 refills | Status: DC | PRN

## 2022-10-19 MED ORDER — CEFDINIR 300 MG PO CAPS: 300.0000 mg | ORAL_CAPSULE | Freq: Every day | ORAL | 0 refills | Status: DC

## 2022-10-19 MED ORDER — COMBIVENT RESPIMAT 20-100 MCG/ACT IN AERS: 1.0000 | INHALATION_SPRAY | Freq: Four times a day (QID) | RESPIRATORY_TRACT | 0 refills | Status: DC | PRN

## 2022-10-19 MED ORDER — PREDNISONE 20 MG PO TABS: 40.0000 mg | ORAL_TABLET | Freq: Every day | ORAL | 0 refills | Status: DC

## 2022-10-19 MED ORDER — CEFDINIR 300 MG PO CAPS: 300.0000 mg | ORAL_CAPSULE | Freq: Every day | ORAL | Status: DC

## 2022-10-19 NOTE — Progress Notes (Signed)
Mobility Specialist - Progress Note   10/19/22 0958  Mobility  Activity Ambulated independently in hallway  Level of Assistance Independent  Assistive Device None  Distance Ambulated (ft) 160 ft  Activity Response Tolerated well  Mobility Referral Yes  $Mobility charge 1 Mobility  Mobility Specialist Start Time (ACUTE ONLY) 0935  Mobility Specialist Stop Time (ACUTE ONLY) 0939  Mobility Specialist Time Calculation (min) (ACUTE ONLY) 4 min   Pt sitting in recliner on RA upon arrival. Pt STS and ambulates 1 lap around NS indep. Pt returns to recliner with needs in reach.   Terrilyn Saver  Mobility Specialist  10/19/22 9:59 AM

## 2022-10-19 NOTE — Discharge Summary (Signed)
Physician Discharge Summary  Edward Cummings ZOX:[id number] DOB: 10-Sep-1932 DOA: 10/17/2022  PCP: Corky Downs, MD  Admit date: 10/17/2022 Discharge date: 10/19/2022  Time spent: 30 minutes  Recommendations for Outpatient Follow-up:   Sepsis due to CAP (community acquired pneumonia):  -Patient meets criteria for sepsis with WBC 13.7, RR 28.  Lactic acid is normal.  Procalcitonin 0.58. -Incentive spirometry - Flutter valve - DuoNeb QID - Mucinex DM -5/8 currently leukocytosis, negative fever, negative left shift, negative bands.  Most likely secondary to steroids. -5/8 respiratory virus panel negative -Sepsis physiology resolved - Complete 5-day course antibiotics  LEFT facial droop (chronic) -Stable:   Hypertension -5/8 Amlodipine 5 mg daily (home dose 10 mg)   Chronic kidney disease (CKD), stage IV (severe) (Baseline creatinine 2.0-2.5.) Lab Results  Component Value Date   CREATININE 2.00 (H) 10/19/2022   CREATININE 2.05 (H) 10/18/2022   CREATININE 2.11 (H) 10/18/2022   CREATININE 2.96 (H) 10/17/2022   CREATININE 2.28 (H) 09/12/2022  -Within WNL for patient  Anemia of chronic disease (11.2 on 09/12/2022),  -No signs of active bleeding - Anemia panel consistent with anemia chronic disease   Hypothyroidism -Synthroid 75 mcg daily - 5/8 TSH = 2.1   Depression with anxiety -Not on any home medication     Discharge Diagnoses:  Principal Problem:   CAP (community acquired pneumonia) Active Problems:   Sepsis (HCC)   Facial droop   Hypertension   Chronic kidney disease (CKD), stage IV (severe) (HCC)   Normocytic anemia   Hypothyroidism   Depression with anxiety   Discharge Condition: Stable   Diet recommendation: Heart healthy  Filed Weights   10/17/22 0340  Weight: 61.2 kg    History of present illness:  89 y.o. WM PMHx HTN,  hypothyroidism, depression with anxiety, CKD stage IV, anemia, bladder cancer, chronic right facial droop and left eye proptosis,     Presents with cough.   Patient denies any symptoms to me, but per his son (I called his son by phone), patient is very forgetful, but no diagnosis of dementia yet.  Per his son, patient has dry cough in the past several days, with mild shortness of breath.  No chest pain. Pt has chills, no fever. Patient does not have nausea vomiting, diarrhea or abdominal pain.  No symptoms of UTI.  His son states that patient recently has poor appetite, and was started on low-dose prednisone 5 mg daily for purpose of gaining weight.  He also had left hand injury and finished a course of Keflex recently.  During the interview, I noticed that the patient has right facial droop and left eye ptosis.  Per his son, this is chronic issue, has been going on for many years.  Patient does not have unilateral numbness or tinglings in extremities with no facial droop or slurred speech.   Data reviewed independently and ED Course: pt was found to have WBC 13.7, lactic acid 1.4, procalcitonin 0.58, negative PCR for COVID, worsening renal function, temperature 97.5, blood pressure 118/59, heart rate 90, RR 28, oxygen saturation 96% on room air.  Chest x-ray showed right perihilar infiltration.  Patient is admitted to telemetry bed as inpatient.    Hospital Course:  See above  Procedures/Significant Events: 5/7 PCXR 1. Right perihilar pneumonia. Followup PA and lateral chest X-ray is recommended in 3-4 weeks following trial of antibiotic therapy to ensure resolution. 2. Emphysema and hiatal hernia.   Antibiotics Anti-infectives (From admission, onward)    Start  Ordered Stop   10/20/22 0600  cefdinir (OMNICEF) capsule 300 mg  Status:  Discontinued        10/19/22 1411 10/19/22 2130   10/20/22 0000  azithromycin (ZITHROMAX) 500 MG tablet        10/19/22 1418     10/20/22 0000  cefdinir (OMNICEF) 300 MG capsule        10/19/22 1418     10/19/22 1500  cefdinir (OMNICEF) capsule 300 mg  Status:  Discontinued         10/19/22 1407 10/19/22 1411   10/19/22 1000  azithromycin (ZITHROMAX) tablet 500 mg  Status:  Discontinued        10/18/22 0831 10/19/22 2130   10/17/22 0630  cefTRIAXone (ROCEPHIN) 2 g in sodium chloride 0.9 % 100 mL IVPB  Status:  Discontinued        10/17/22 0626 10/19/22 1407   10/17/22 0630  azithromycin (ZITHROMAX) 500 mg in sodium chloride 0.9 % 250 mL IVPB  Status:  Discontinued        10/17/22 0626 10/18/22 0831         Discharge Exam: Vitals:   10/19/22 0043 10/19/22 0623 10/19/22 0747 10/19/22 1225  BP: 139/67 (!) 158/87 (!) 142/80 130/73  Pulse: 66 64 67 67  Resp: 18 16 16 16   Temp: 98.2 F (36.8 C) 98 F (36.7 C) 98.3 F (36.8 C) (!) 97.5 F (36.4 C)  TempSrc: Oral     SpO2: 97% 98% 98% 97%  Weight:      Height:        General: A/O x 4, No acute respiratory distress Eyes: negative scleral hemorrhage, negative anisocoria, negative icterus ENT: Negative Runny nose, negative gingival bleeding, Neck:  Negative scars, masses, torticollis, lymphadenopathy, JVD Lungs: Clear to auscultation bilaterally, mild decreased breath sounds RLL, positive crackles RLL  Cardiovascular: Regular rate and rhythm without murmur gallop or rub normal S1 and S2  Discharge Instructions   Allergies as of 10/19/2022   No Known Allergies      Medication List     TAKE these medications    amLODipine 10 MG tablet Commonly known as: NORVASC TAKE ONE TABLET BY MOUTH ONCE DAILY   aspirin EC 81 MG tablet Take 81 mg by mouth daily.   azithromycin 500 MG tablet Commonly known as: ZITHROMAX Take 1 tablet (500 mg total) by mouth daily. Start taking on: Oct 20, 2022   cefdinir 300 MG capsule Commonly known as: OMNICEF Take 1 capsule (300 mg total) by mouth daily. Start taking on: Oct 20, 2022   cephALEXin 500 MG capsule Commonly known as: KEFLEX Take 1 capsule (500 mg total) by mouth 3 (three) times daily.   Combivent Respimat 20-100 MCG/ACT Aers respimat Generic drug:  Ipratropium-Albuterol Inhale 1 puff into the lungs every 6 (six) hours as needed for wheezing or shortness of breath.   dextromethorphan-guaiFENesin 30-600 MG 12hr tablet Commonly known as: MUCINEX DM Take 1 tablet by mouth 2 (two) times daily as needed for cough.   levothyroxine 75 MCG tablet Commonly known as: SYNTHROID TAKE 1 TABLET BY MOUTH ONCE A DAY BEFOREBREAKFAST.   loratadine 10 MG tablet Commonly known as: CLARITIN TAKE 1 TABLET BY MOUTH ONCE A DAY   mometasone 50 MCG/ACT nasal spray Commonly known as: NASONEX PLACE 2 SPRAYS INTO THE NOSE DAILY   multivitamin with minerals Tabs tablet Take 1 tablet by mouth daily.   ondansetron 4 MG tablet Commonly known as: Zofran Take 1 tablet (4 mg total)  by mouth daily as needed for nausea or vomiting.   prednisoLONE acetate 1 % ophthalmic suspension Commonly known as: PRED FORTE Place 1 drop into both eyes daily at 2 PM.   predniSONE 20 MG tablet Commonly known as: DELTASONE Take 2 tablets (40 mg total) by mouth daily with breakfast. Start taking on: Oct 20, 2022 What changed:  medication strength See the new instructions.       No Known Allergies    The results of significant diagnostics from this hospitalization (including imaging, microbiology, ancillary and laboratory) are listed below for reference.    Significant Diagnostic Studies: DG Chest Portable 1 View  Result Date: 10/17/2022 CLINICAL DATA:  Shortness of breath EXAM: PORTABLE CHEST 1 VIEW COMPARISON:  07/01/2020 FINDINGS: Airspace disease in the right perihilar lung. Apical lucency and hyperinflation. Gas-filled structure behind the heart from hiatal hernia. No edema, effusion, or pneumothorax. IMPRESSION: 1. Right perihilar pneumonia. Followup PA and lateral chest X-ray is recommended in 3-4 weeks following trial of antibiotic therapy to ensure resolution. 2. Emphysema and hiatal hernia. Electronically Signed   By: Tiburcio Pea M.D.   On: 10/17/2022  03:56   DG Hand Complete Left  Result Date: 10/04/2022 CLINICAL DATA:  Left hand laceration. "Mashed" left hand while working on a car. EXAM: LEFT HAND - COMPLETE 3+ VIEW COMPARISON:  None Available. FINDINGS: There is diffuse decreased bone mineralization. Moderate to severe thumb carpometacarpal joint space narrowing, subchondral sclerosis, and peripheral osteophytosis. Moderate to severe thumb interphalangeal and moderate thumb metacarpophalangeal osteoarthritis. Mild-to-moderate second through fifth DIP greater than PIP osteoarthritis. 3 mm ulnar positive variance. There are at least 4 punctate metallic density seen within the forearm and hand. This includes a 3 mm metallic density just radial/lateral and volar to the base of the distal phalanx of the fifth finger, 1.5 mm metallic density chest radial/volar to the proximal aspect of the proximal phalanx of the index finger, 1.5 mm metallic density just volar and lateral to the distal metaphysis of the thumb metacarpal, and 2 mm metallic density just dorsal to the distal radial diaphysis of the distal forearm. Tiny chronic well corticated ossicle at the distal tip of the ulnar styloid, the sequela of remote trauma. No acute fracture. IMPRESSION: 1. No acute fracture. 2. Moderate to severe thumb carpometacarpal, moderate to severe thumb interphalangeal, and moderate thumb metacarpophalangeal osteoarthritis. 3. There are at least 4 punctate metallic densities seen within the forearm and hand. Recommend clinical correlation for foreign bodies. Electronically Signed   By: Neita Garnet M.D.   On: 10/04/2022 12:31    Microbiology: Recent Results (from the past 240 hour(s))  SARS Coronavirus 2 by RT PCR (hospital order, performed in [facility] hospital lab) *cepheid single result test* Anterior Nasal Swab     Status: None   Collection Time: 10/17/22  3:47 AM   Specimen: Anterior Nasal Swab  Result Value Ref Range Status   SARS Coronavirus 2 by RT PCR  NEGATIVE NEGATIVE Final    Comment: (NOTE) SARS-CoV-2 target nucleic acids are NOT DETECTED.  The SARS-CoV-2 RNA is generally detectable in upper and lower respiratory specimens during the acute phase of infection. The lowest concentration of SARS-CoV-2 viral copies this assay can detect is 250 copies / mL. A negative result does not preclude SARS-CoV-2 infection and should not be used as the sole basis for treatment or other patient management decisions.  A negative result may occur with improper specimen collection / handling, submission of specimen other than nasopharyngeal swab,  presence of viral mutation(s) within the areas targeted by this assay, and inadequate number of viral copies (<250 copies / mL). A negative result must be combined with clinical observations, patient history, and epidemiological information.  Fact Sheet for Patients:   RoadLapTop.co.za  Fact Sheet for Healthcare Providers: http://kim-miller.com/  This test is not yet approved or  cleared by the [place] FDA and has been authorized for detection and/or diagnosis of SARS-CoV-2 by FDA under an Emergency Use Authorization (EUA).  This EUA will remain in effect (meaning this test can be used) for the duration of the COVID-19 declaration under Section 564(b)(1) of the Act, 21 U.S.C. section 360bbb-3(b)(1), unless the authorization is terminated or revoked sooner.  Performed at [facility], [address] [place], [place] [address]   Blood culture (routine x 2)     Status: None (Preliminary result)   Collection Time: 10/17/22  6:40 AM   Specimen: BLOOD  Result Value Ref Range Status   Specimen Description BLOOD  LEFT [facility]  Final   Special Requests   Final    BOTTLES DRAWN AEROBIC AND ANAEROBIC Blood Culture results may not be optimal due to an excessive volume of blood received in culture bottles   Culture   Final    NO GROWTH 2 DAYS Performed  at [facility], [address], [place], [place] [address]    Report Status PENDING  Incomplete  Blood culture (routine x 2)     Status: None (Preliminary result)   Collection Time: 10/17/22  6:45 AM   Specimen: BLOOD  Result Value Ref Range Status   Specimen Description BLOOD  LEFT [facility]  Final   Special Requests   Final    BOTTLES DRAWN AEROBIC AND ANAEROBIC Blood Culture results may not be optimal due to an excessive volume of blood received in culture bottles   Culture   Final    NO GROWTH 2 DAYS Performed at [facility], [address] [place], [place] [address]    Report Status PENDING  Incomplete  Respiratory (~20 pathogens) panel by PCR     Status: None   Collection Time: 10/18/22  8:09 PM   Specimen: Nasopharyngeal Swab; Respiratory  Result Value Ref Range Status   Adenovirus NOT DETECTED NOT DETECTED Final   Coronavirus 229E NOT DETECTED NOT DETECTED Final    Comment: (NOTE) The Coronavirus on the Respiratory Panel, DOES NOT test for the novel  Coronavirus (2019 nCoV)    Coronavirus HKU1 NOT DETECTED NOT DETECTED Final   Coronavirus NL63 NOT DETECTED NOT DETECTED Final   Coronavirus OC43 NOT DETECTED NOT DETECTED Final   Metapneumovirus NOT DETECTED NOT DETECTED Final   Rhinovirus / Enterovirus NOT DETECTED NOT DETECTED Final   Influenza A NOT DETECTED NOT DETECTED Final   Influenza B NOT DETECTED NOT DETECTED Final   Parainfluenza Virus 1 NOT DETECTED NOT DETECTED Final   Parainfluenza Virus 2 NOT DETECTED NOT DETECTED Final   Parainfluenza Virus 3 NOT DETECTED NOT DETECTED Final   Parainfluenza Virus 4 NOT DETECTED NOT DETECTED Final   Respiratory Syncytial Virus NOT DETECTED NOT DETECTED Final   Bordetella pertussis NOT DETECTED NOT DETECTED Final   Bordetella Parapertussis NOT DETECTED NOT DETECTED Final   Chlamydophila pneumoniae NOT DETECTED NOT DETECTED Final   Mycoplasma pneumoniae NOT DETECTED NOT DETECTED Final    Comment:  Performed at [facility], [address], [place], [place] [address]     Labs: Basic Metabolic Panel: Recent  Labs  Lab 10/17/22 0347 10/18/22 0617 10/18/22 0736 10/19/22 0556  NA 140 140 139 137  K 4.6 4.3 4.3 4.6  CL 111 111 113* 107  CO2 20* 24 22 22   GLUCOSE 99 85 83 80  BUN 47* 35* 34* 28*  CREATININE 2.96* 2.11* 2.05* 2.00*  CALCIUM 8.8* 8.9 8.7* 9.0  MG  --   --  2.2 1.9  PHOS  --   --  3.2 3.1   Liver Function Tests: Recent Labs  Lab 10/17/22 0347 10/18/22 0736 10/19/22 0556  AST 27 21 22   ALT 17 14 14   ALKPHOS 53 45 47  BILITOT 0.5 0.8 0.7  PROT 6.5 5.7* 5.8*  ALBUMIN 3.8 3.1* 3.1*   No results for input(s): "LIPASE", "AMYLASE" in the last 168 hours. No results for input(s): "AMMONIA" in the last 168 hours. CBC: Recent Labs  Lab 10/17/22 0347 10/18/22 0617 10/19/22 0556  WBC 13.7* 15.5* 11.7*  NEUTROABS 10.1*  --  7.8*  HGB 10.5* 10.5* 9.6*  HCT 31.7* 31.8* 29.2*  MCV 99.7 97.5 98.3  PLT 250 223 212   Cardiac Enzymes: No results for input(s): "CKTOTAL", "CKMB", "CKMBINDEX", "TROPONINI" in the last 168 hours. BNP: BNP (last 3 results) No results for input(s): "BNP" in the last 8760 hours.  ProBNP (last 3 results) No results for input(s): "PROBNP" in the last 8760 hours.  CBG: No results for input(s): "GLUCAP" in the last 168 hours.     Signed:  Carolyne Littles, MD [facility]

## 2022-10-19 NOTE — TOC CM/SW Note (Signed)
  Transition of Care [facility]) Screening Note   Patient Details  Name: LORETTA ANDREOLI Date of Birth: [birth year]   Transition of Care [facility]) CM/SW Contact:    Allena Katz, LCSW Phone Number: 10/19/2022, 3:06 PM    Transition of Care Department [facility]) has reviewed patient and no TOC needs have been identified at this time. We will continue to monitor patient advancement through interdisciplinary progression rounds. If new patient transition needs arise, please place a TOC consult.

## 2022-10-19 NOTE — Plan of Care (Signed)
Patient is adequate for discharge to previous environment with family support. 

## 2022-10-19 NOTE — Progress Notes (Signed)
PHARMACY NOTE:  ANTIMICROBIAL RENAL DOSAGE ADJUSTMENT  Current antimicrobial regimen includes a mismatch between antimicrobial dosage and estimated renal function.  As per policy approved by the Pharmacy & Therapeutics and Medical Executive Committees, the antimicrobial dosage will be adjusted accordingly.  Current antimicrobial dosage:  cefdinir 300mg  BID   Renal Function:  Estimated Creatinine Clearance: 22.1 mL/min (A) (by C-G formula based on SCr of 2 mg/dL (H)).  Antimicrobial dosage has been changed to:  Cefdinir 300mg  Daily for CrCl <70ml/min  Additional comments:   Thank you for allowing pharmacy to be a part of this patient's care.  Gardner Candle, PharmD, BCPS Clinical Pharmacist 10/19/2022 2:12 PM

## 2022-10-20 ENCOUNTER — Telehealth: Payer: Self-pay | Admitting: *Deleted

## 2022-10-20 ENCOUNTER — Encounter: Payer: Self-pay | Admitting: *Deleted

## 2022-10-20 LAB — CULTURE, BLOOD (ROUTINE X 2): Culture: NO GROWTH

## 2022-10-20 NOTE — Transitions of Care (Post Inpatient/ED Visit) (Signed)
10/20/2022  Name: Edward Cummings MRN: [id number] DOB: November 17, 1932  Today's TOC FU Call Status: Today's TOC FU Call Status:: Successful TOC FU Call Competed TOC FU Call Complete Date: 10/20/22  Transition Care Management Follow-up Telephone Call Date of Discharge: 10/19/22 Discharge Facility: [facility]) Type of Discharge: Inpatient Admission Primary Inpatient Discharge Diagnosis:: CAP (community acquired pneumonia) How have you been since you were released from the hospital?: Better ("I feel good") Any questions or concerns?: No  Items Reviewed: Did you receive and understand the discharge instructions provided?: Yes Medications obtained,verified, and reconciled?: Yes (Medications Reviewed) Any new allergies since your discharge?: No Dietary orders reviewed?: Yes Type of Diet Ordered:: Heart healthy Do you have support at home?: Yes People in Home: child(ren), adult Name of Support/Comfort Primary Source: Bernette Redbird (son) and Dois Davenport (son), and son [name]  Medications Reviewed Today: Medications Reviewed Today     Reviewed by Gwenith Daily, RN (Registered Nurse) on 10/20/22 at 1212  Med List Status: <None>   Medication Order Taking? Sig Documenting Provider Last Dose Status Informant  amLODipine (NORVASC) 10 MG tablet [id number] Yes TAKE ONE TABLET BY MOUTH ONCE DAILY Kara Dies, NP Taking Active Pharmacy Records, Multiple Informants, Other  aspirin EC 81 MG tablet [id number] Yes Take 81 mg by mouth daily. [provider] Taking Active Family Member, Pharmacy Records, Multiple Informants, Other           Med Note Gala Murdoch   Fri Jan 10, 2019  9:07 AM)    azithromycin (ZITHROMAX) 500 MG tablet [id number] Yes Take 1 tablet (500 mg total) by mouth daily. Drema Dallas, MD Taking Active   cefdinir (OMNICEF) 300 MG capsule [id number] Yes Take 1 capsule (300 mg total) by mouth daily. Drema Dallas, MD Taking Active    dextromethorphan-guaiFENesin [facility] DM) 30-600 MG 12hr tablet [id number] Yes Take 1 tablet by mouth 2 (two) times daily as needed for cough. Drema Dallas, MD Taking Active   Ipratropium-Albuterol (COMBIVENT RESPIMAT) 20-100 MCG/ACT AERS respimat [id number] Yes Inhale 1 puff into the lungs every 6 (six) hours as needed for wheezing or shortness of breath. Drema Dallas, MD Taking Active   levothyroxine (SYNTHROID) 75 MCG tablet [id number] Yes TAKE 1 TABLET BY MOUTH ONCE A DAY BEFOREBREAKFAST. Earna Coder, MD Taking Active Pharmacy Records, Multiple Informants, Other  loratadine (CLARITIN) 10 MG tablet [id number] Yes TAKE 1 TABLET BY MOUTH ONCE A Frances Nickels, MD Taking Active Pharmacy Records, Multiple Informants, Other  mometasone (NASONEX) 50 MCG/ACT nasal spray [id number] Yes PLACE 2 SPRAYS INTO THE NOSE DAILY Corky Downs, MD Taking Active Pharmacy Records, Multiple Informants, Other  Multiple Vitamin (MULTIVITAMIN WITH MINERALS) TABS tablet [id number] Yes Take 1 tablet by mouth daily.  [provider] Taking Active Family Member, Pharmacy Records, Multiple Informants, Other  ondansetron (ZOFRAN) 4 MG tablet [id number] Yes Take 1 tablet (4 mg total) by mouth daily as needed for nausea or vomiting. Drema Dallas, MD Taking Active   prednisoLONE acetate (PRED FORTE) 1 % ophthalmic suspension [id number] Yes Place 1 drop into both eyes daily at 2 PM. Corky Downs, MD Taking Active Pharmacy Records, Multiple Informants, Other  predniSONE (DELTASONE) 20 MG tablet [id number] Yes Take 2 tablets (40 mg total) by mouth daily with breakfast. Drema Dallas, MD Taking Active             Home Care and Equipment/Supplies: Were Home Health Services Ordered?: No Any new equipment or medical supplies  ordered?: No  Functional Questionnaire: Do you need assistance with bathing/showering or dressing?: No Do you need assistance with meal preparation?: Yes Do you need assistance with  eating?: No Do you have difficulty maintaining continence: No Do you need assistance with getting out of bed/getting out of a chair/moving?: No Do you have difficulty managing or taking your medications?: Yes (sandra assists with meds)  Follow up appointments reviewed: PCP Follow-up appointment confirmed?: No (working with Scheduling Care Guides. They will reach out to patient directly to schedule.) MD Provider Line Number:[phone] Given: No Specialist Hospital Follow-up appointment confirmed?: NA Do you need transportation to your follow-up appointment?: No Do you understand care options if your condition(s) worsen?: Yes-patient verbalized understanding  SDOH Interventions Today    Flowsheet Row Most Recent Value  SDOH Interventions   Housing Interventions Intervention Not Indicated  [Lives with son Bernette Redbird and his wife Sandra]  Transportation Interventions Intervention Not Indicated  Financial Strain Interventions Intervention Not Indicated      Interventions Today    Flowsheet Row Most Recent Value  Exercise Interventions   Exercise Discussed/Reviewed Physical Activity  Physical Activity Discussed/Reviewed Physical Activity Discussed, Physical Activity Reviewed  Nutrition Interventions   Nutrition Discussed/Reviewed Nutrition Discussed, Fluid intake, Nutrition Reviewed  Pharmacy Interventions   Pharmacy Dicussed/Reviewed Medications and their functions, Pharmacy Topics Discussed, Pharmacy Topics Reviewed  Safety Interventions   Safety Discussed/Reviewed Safety Discussed, Fall Risk, Safety Reviewed      TOC Interventions Today    Flowsheet Row Most Recent Value  TOC Interventions   TOC Interventions Discussed/Reviewed TOC Interventions Discussed, TOC Interventions Reviewed, Post discharge activity limitations per provider, S/S of infection, Arranged PCP follow up less than 12 days/Care Guide scheduled      Demetrios Loll, BSN, RN-BC RN Care Coordinator [facility]   [facility] Direct Dial: [phone] Main #: [phone]

## 2022-10-20 NOTE — Progress Notes (Signed)
  Care Coordination Note  10/20/2022 Name: Edward Cummings MRN: [id number] DOB: 10-10-1932  Edward Cummings is a 89 y.o. year old male who is a primary care patient of Corky Downs, MD advised patient to call and schedule hospital follow up with primary care physician patient understood and thanked me for the call.   [facility]  Care Coordination Care Guide  Direct Dial: [phone]

## 2022-10-20 NOTE — Transitions of Care (Post Inpatient/ED Visit) (Signed)
10/20/2022  Name: Edward Cummings MRN: [id number] DOB: 1933-01-31  Today's TOC FU Call Status: TOC FU Call Complete Date: 10/20/22  Transition Care Management Follow-up Telephone Call Date of Discharge: 10/19/22 Discharge Facility: [facility]) Type of Discharge: Inpatient Admission Primary Inpatient Discharge Diagnosis:: CAP (community acquired pneumonia) How have you been since you were released from the hospital?: Better ("I feel good") Any questions or concerns?: No  Items Reviewed: Did you receive and understand the discharge instructions provided?: Yes Medications obtained,verified, and reconciled?: Yes (Medications Reviewed) Any new allergies since your discharge?: No Dietary orders reviewed?: Yes Type of Diet Ordered:: heart healthy Do you have support at home?: Yes People in Home: child(ren), adult Name of Support/Comfort Primary Source: Bernette Redbird (son) and Dois Davenport (daughter-in-law), and son [name]  Medications Reviewed Today: Medications Reviewed Today     Reviewed by Gwenith Daily, RN (Registered Nurse) on 10/20/22 at 1212  Med List Status: <None>   Medication Order Taking? Sig Documenting Provider Last Dose Status Informant  amLODipine (NORVASC) 10 MG tablet [id number] Yes TAKE ONE TABLET BY MOUTH ONCE DAILY Kara Dies, NP Taking Active Pharmacy Records, Multiple Informants, Other  aspirin EC 81 MG tablet [id number] Yes Take 81 mg by mouth daily. [provider] Taking Active Family Member, Pharmacy Records, Multiple Informants, Other           Med Note Gala Murdoch   Fri Jan 10, 2019  9:07 AM)    azithromycin (ZITHROMAX) 500 MG tablet [id number] Yes Take 1 tablet (500 mg total) by mouth daily. Drema Dallas, MD Taking Active   cefdinir (OMNICEF) 300 MG capsule [id number] Yes Take 1 capsule (300 mg total) by mouth daily. Drema Dallas, MD Taking Active   dextromethorphan-guaiFENesin [facility] DM) 30-600 MG 12hr tablet  [id number] Yes Take 1 tablet by mouth 2 (two) times daily as needed for cough. Drema Dallas, MD Taking Active   Ipratropium-Albuterol (COMBIVENT RESPIMAT) 20-100 MCG/ACT AERS respimat [id number] Yes Inhale 1 puff into the lungs every 6 (six) hours as needed for wheezing or shortness of breath. Drema Dallas, MD Taking Active   levothyroxine (SYNTHROID) 75 MCG tablet [id number] Yes TAKE 1 TABLET BY MOUTH ONCE A DAY BEFOREBREAKFAST. Earna Coder, MD Taking Active Pharmacy Records, Multiple Informants, Other  loratadine (CLARITIN) 10 MG tablet [id number] Yes TAKE 1 TABLET BY MOUTH ONCE A Frances Nickels, MD Taking Active Pharmacy Records, Multiple Informants, Other  mometasone (NASONEX) 50 MCG/ACT nasal spray [id number] Yes PLACE 2 SPRAYS INTO THE NOSE DAILY Corky Downs, MD Taking Active Pharmacy Records, Multiple Informants, Other  Multiple Vitamin (MULTIVITAMIN WITH MINERALS) TABS tablet [id number] Yes Take 1 tablet by mouth daily.  [provider] Taking Active Family Member, Pharmacy Records, Multiple Informants, Other  ondansetron (ZOFRAN) 4 MG tablet [id number] Yes Take 1 tablet (4 mg total) by mouth daily as needed for nausea or vomiting. Drema Dallas, MD Taking Active   prednisoLONE acetate (PRED FORTE) 1 % ophthalmic suspension [id number] Yes Place 1 drop into both eyes daily at 2 PM. Corky Downs, MD Taking Active Pharmacy Records, Multiple Informants, Other  predniSONE (DELTASONE) 20 MG tablet [id number] Yes Take 2 tablets (40 mg total) by mouth daily with breakfast. Drema Dallas, MD Taking Active             Home Care and Equipment/Supplies: Were Home Health Services Ordered?: No Any new equipment or medical supplies ordered?: No  Functional Questionnaire: Do you need assistance with  bathing/showering or dressing?: No Do you need assistance with meal preparation?: Yes Do you need assistance with eating?: No Do you have difficulty maintaining continence:  No Do you need assistance with getting out of bed/getting out of a chair/moving?: No Do you have difficulty managing or taking your medications?: Yes (sandra assists with meds)  Follow up appointments reviewed: PCP Follow-up appointment confirmed?: No (Scheduling care guides are working on f/u appt. PCP may no longer be a part of [facility] or [facility]) MD Provider Line Number:[phone] Given: No Specialist Hospital Follow-up appointment confirmed?: NA Do you need transportation to your follow-up appointment?: No Do you understand care options if your condition(s) worsen?: Yes-patient verbalized understanding   TOC Interventions Today    Flowsheet Row Most Recent Value  TOC Interventions   TOC Interventions Discussed/Reviewed TOC Interventions Discussed, TOC Interventions Reviewed, Post discharge activity limitations per provider, S/S of infection, Arranged PCP follow up less than 12 days/Care Guide scheduled      Interventions Today    Flowsheet Row Most Recent Value  Exercise Interventions   Physical Activity Discussed/Reviewed Physical Activity Discussed, Physical Activity Reviewed  Nutrition Interventions   Nutrition Discussed/Reviewed Nutrition Discussed, Fluid intake, Nutrition Reviewed  Pharmacy Interventions   Pharmacy Dicussed/Reviewed Medications and their functions, Pharmacy Topics Discussed, Pharmacy Topics Reviewed  Safety Interventions   Safety Discussed/Reviewed Safety Discussed, Safety Reviewed, Fall Risk      Demetrios Loll, BSN, RN-BC RN Care Coordinator [facility]  [facility] Direct Dial: [phone] Main #: [phone]

## 2022-10-21 LAB — CULTURE, BLOOD (ROUTINE X 2): Culture: NO GROWTH

## 2022-10-25 DIAGNOSIS — J189 Pneumonia, unspecified organism: Secondary | ICD-10-CM | POA: Diagnosis not present

## 2022-10-25 DIAGNOSIS — C679 Malignant neoplasm of bladder, unspecified: Secondary | ICD-10-CM | POA: Diagnosis not present

## 2022-10-25 DIAGNOSIS — I10 Essential (primary) hypertension: Secondary | ICD-10-CM | POA: Diagnosis not present

## 2022-10-25 DIAGNOSIS — N189 Chronic kidney disease, unspecified: Secondary | ICD-10-CM | POA: Diagnosis not present

## 2022-10-30 ENCOUNTER — Other Ambulatory Visit: Payer: Self-pay | Admitting: Internal Medicine

## 2022-10-30 ENCOUNTER — Telehealth: Payer: Self-pay | Admitting: Internal Medicine

## 2022-10-30 NOTE — Telephone Encounter (Signed)
Reviewed that patient was recently in the hospital for pneumonia.  Will discontinue prednisone.  O/M- Please inform the patient's son [name] that I would not recommend restarting/refilling prednisone as this might increase his chance of repeated pneumonia.   If patient is losing weight/poor appetite would consider alternative medications-if interested recommend appointment at the next 1 to 2 weeks.  GB

## 2022-10-31 ENCOUNTER — Telehealth: Payer: Self-pay | Admitting: *Deleted

## 2022-10-31 NOTE — Telephone Encounter (Signed)
Per son [name], patient is eating pretty good now and he says his weight is staying the same so he will monitor for now and give [place] a call if anything changes. He verbalizes understanding of stopping prednisone

## 2022-10-31 NOTE — Telephone Encounter (Signed)
Patient son called asking why patient Prednisone was denied. Please advise

## 2022-11-01 ENCOUNTER — Other Ambulatory Visit: Payer: Self-pay | Admitting: Internal Medicine

## 2022-11-04 ENCOUNTER — Encounter: Payer: Self-pay | Admitting: Internal Medicine

## 2022-11-04 ENCOUNTER — Other Ambulatory Visit: Payer: Self-pay | Admitting: Internal Medicine

## 2022-11-04 NOTE — Progress Notes (Signed)
Patient son called regarding father being weak/acting strange.  Concerned about " withdrawal given lack of prednisone".  Would recommend evaluation emergency room-if negative workup for infection or any other cause I think it is reasonable to continue/restart prednisone.   GB

## 2022-11-05 ENCOUNTER — Emergency Department
Admission: EM | Admit: 2022-11-05 | Discharge: 2022-11-05 | Disposition: A | Discharge status: Home / Self Care | Payer: Medicare HMO | Attending: Emergency Medicine | Admitting: Emergency Medicine

## 2022-11-05 ENCOUNTER — Other Ambulatory Visit: Payer: Self-pay

## 2022-11-05 ENCOUNTER — Emergency Department: Payer: Medicare HMO

## 2022-11-05 DIAGNOSIS — I129 Hypertensive chronic kidney disease with stage 1 through stage 4 chronic kidney disease, or unspecified chronic kidney disease: Secondary | ICD-10-CM | POA: Insufficient documentation

## 2022-11-05 DIAGNOSIS — R531 Weakness: Secondary | ICD-10-CM | POA: Diagnosis not present

## 2022-11-05 DIAGNOSIS — E039 Hypothyroidism, unspecified: Secondary | ICD-10-CM | POA: Insufficient documentation

## 2022-11-05 DIAGNOSIS — R079 Chest pain, unspecified: Secondary | ICD-10-CM | POA: Diagnosis not present

## 2022-11-05 DIAGNOSIS — N184 Chronic kidney disease, stage 4 (severe): Secondary | ICD-10-CM | POA: Diagnosis not present

## 2022-11-05 DIAGNOSIS — N179 Acute kidney failure, unspecified: Secondary | ICD-10-CM | POA: Diagnosis not present

## 2022-11-05 DIAGNOSIS — J449 Chronic obstructive pulmonary disease, unspecified: Secondary | ICD-10-CM | POA: Diagnosis not present

## 2022-11-05 DIAGNOSIS — J189 Pneumonia, unspecified organism: Secondary | ICD-10-CM | POA: Diagnosis not present

## 2022-11-05 DIAGNOSIS — Z8551 Personal history of malignant neoplasm of bladder: Secondary | ICD-10-CM | POA: Diagnosis not present

## 2022-11-05 LAB — COMPREHENSIVE METABOLIC PANEL
ALT: 17 U/L (ref 0–44)
AST: 25 U/L (ref 15–41)
Albumin: 3.6 g/dL (ref 3.5–5.0)
Alkaline Phosphatase: 62 U/L (ref 38–126)
Anion gap: 5 (ref 5–15)
BUN: 42 mg/dL — ABNORMAL HIGH (ref 8–23)
CO2: 26 mmol/L (ref 22–32)
Calcium: 9.6 mg/dL (ref 8.9–10.3)
Chloride: 104 mmol/L (ref 98–111)
Creatinine, Ser: 2.86 mg/dL — ABNORMAL HIGH (ref 0.61–1.24)
GFR, Estimated: 21 mL/min — ABNORMAL LOW (ref >60)
Glucose, Bld: 130 mg/dL — ABNORMAL HIGH (ref 70–99)
Potassium: 4.4 mmol/L (ref 3.5–5.1)
Sodium: 135 mmol/L (ref 135–145)
Total Bilirubin: 0.9 mg/dL (ref 0.3–1.2)
Total Protein: 6.8 g/dL (ref 6.5–8.1)

## 2022-11-05 LAB — CBC
HCT: 33.9 % — ABNORMAL LOW (ref 39.0–52.0)
Hemoglobin: 11.1 g/dL — ABNORMAL LOW (ref 13.0–17.0)
MCH: 32.3 pg (ref 26.0–34.0)
MCHC: 32.7 g/dL (ref 30.0–36.0)
MCV: 98.5 fL (ref 80.0–100.0)
Platelets: 268 10*3/uL (ref 150–400)
RBC: 3.44 MIL/uL — ABNORMAL LOW (ref 4.22–5.81)
RDW: 12.8 % (ref 11.5–15.5)
WBC: 8.5 10*3/uL (ref 4.0–10.5)
nRBC: 0 % (ref 0.0–0.2)

## 2022-11-05 LAB — TROPONIN I (HIGH SENSITIVITY): Troponin I (High Sensitivity): 9 ng/L (ref <18)

## 2022-11-05 MED ORDER — PREDNISONE 5 MG PO TABS: 5.0000 mg | ORAL_TABLET | Freq: Every day | ORAL | 0 refills | Status: AC

## 2022-11-05 MED ORDER — LACTATED RINGERS IV BOLUS
1000.0000 mL | Freq: Once | INTRAVENOUS | Status: AC
  Administered 2022-11-05: 1000 mL via INTRAVENOUS

## 2022-11-05 MED ORDER — PREDNISONE 10 MG PO TABS
5.0000 mg | ORAL_TABLET | Freq: Once | ORAL | Status: AC
  Administered 2022-11-05: 5 mg via ORAL
  Filled 2022-11-05: qty 1

## 2022-11-05 NOTE — ED Triage Notes (Addendum)
Pt comes with c/o weakness for about week now. Pt states not really eating and little energy.  Pt was admitted for pneumonia in early May.  Pt does have droop noted to left side from a nerve issue.  Pt states little back pain to right side.  Pt denies any sob or dizziness.

## 2022-11-05 NOTE — Discharge Instructions (Addendum)
Your chest x-ray shows that the pneumonia has resolved.  Please make sure you are drinking plenty of fluids as your kidney function has declined somewhat I suspect due to dehydration.  You can resume taking the 5 mg of prednisone daily.  Please follow-up with your oncologist to discuss long-term use of prednisone.

## 2022-11-05 NOTE — ED Provider Notes (Signed)
[facility] Provider Note    Event Date/Time   First MD Initiated Contact with Patient 11/05/22 [phone]     (approximate)   History   Weakness   HPI  Edward Cummings is a 89 y.o. male past medical history of CKD, hypertension, metastatic transitional cell carcinoma of the bladder who presents because of weakness.  Patient was hospitalized at the beginning of this month for pneumonia.  His son and he notes that since leaving the hospital he has not regained his energy.  He feels very fatigued and has had decreased appetite.  They are unsure of any weight loss.  Patient complains of some low back pain and pain in his right lung does feel worse when he ambulates.  Says that he has to stop walking around earlier than he did previously because of both fatigue and this low to mid back pain.  He denies shortness of breath fevers chills cough.  Denies urinary symptoms.  Denies nausea vomiting or abdominal pain.  No diarrhea or constipation.  Patient was previously on 5 mg of prednisone for prolonged period of time due to poor appetite.  After leaving the hospital he was given several days of 40 mg of prednisone and advised to go back to his 5 mg dose.  He was not able to get the prednisone refilled and called his oncologist Dr. Racheal Patches with the prednisone who recommended that he stop taking it in case he still has an infection.   Reviewed last oncology note.  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  Last received immunotherapy infusion in January 2022 and is just getting surveillance.  He is on prednisone for history of pneumonitis and poor appetite.    Past Medical History:  Diagnosis Date   Anemia    Anxiety 12/31/2019   Cancer [facility])    bladder   Chronic kidney disease    Depression    Hypertension    Neuromuscular disorder (HCC)    Nerve damage to left face/eye since around 2002.    Patient Active  Problem List   Diagnosis Date Noted   Sepsis due to pneumonia (HCC) 10/17/2022   CAP (community acquired pneumonia) 10/17/2022   Sepsis (HCC) 10/17/2022   Normocytic anemia 10/17/2022   Depression with anxiety 10/17/2022   Facial droop 10/17/2022   Biceps tendinitis of right upper extremity 06/17/2022   Hypothyroidism 12/18/2021   Chronic kidney disease (CKD), stage IV (severe) (HCC) 12/18/2021   Suspected COVID-19 virus infection 03/21/2021   Rhinorrhea 10/14/2020   Hernia, inguinal, left 08/25/2020   Stage 3a chronic kidney disease (HCC) 05/24/2020   Cervicogenic headache 05/24/2020   Tobacco abuse counseling 04/05/2020   Urothelial carcinoma of bladder (HCC) 02/25/2020   Cerumen debris on tympanic membrane of both ears 12/31/2019   Seasonal allergic rhinitis due to pollen 12/31/2019   Anxiety 12/31/2019   Annual physical exam 07/25/2018   Syncope 04/13/2018   Cancer of overlapping sites of bladder (HCC) 02/04/2018   Anemia due to stage 3 chronic kidney disease (HCC) 02/04/2018   AKI (acute kidney injury) (HCC) 12/12/2017   Hypertension 11/19/2013   Fuchs' corneal dystrophy 10/10/2013     Physical Exam  Triage Vital Signs: ED Triage Vitals  Enc Vitals Group     BP      Pulse      Resp      Temp      Temp src  SpO2      Weight      Height      Head Circumference      Peak Flow      Pain Score      Pain Loc      Pain Edu?      Excl. in GC?     Most recent vital signs: Vitals:   11/05/22 1130 11/05/22 1139  BP:  (!) 142/62  Pulse: 64 64  Resp: 16 19  Temp:    SpO2: 100% 100%     General: Awake, no distress.  CV:  Good peripheral perfusion.  Resp:  Normal effort.  Lung sounds are clear Abd:  No distention.  Abdomen is soft and nontender Neuro:             Awake, Alert, Oriented x 3  Other:  Chronic right-sided facial droop 5-5 strength in bilateral upper and lower extremities No focal bony tenderness of the C, T or L-spine  ED Results /  Procedures / Treatments  Labs (all labs ordered are listed, but only abnormal results are displayed) Labs Reviewed  CBC - Abnormal; Notable for the following components:      Result Value   RBC 3.44 (*)    Hemoglobin 11.1 (*)    HCT 33.9 (*)    All other components within normal limits  COMPREHENSIVE METABOLIC PANEL - Abnormal; Notable for the following components:   Glucose, Bld 130 (*)    BUN 42 (*)    Creatinine, Ser 2.86 (*)    GFR, Estimated 21 (*)    All other components within normal limits  URINALYSIS, ROUTINE W REFLEX MICROSCOPIC  CBG MONITORING, ED  TROPONIN I (HIGH SENSITIVITY)     EKG  I reviewed and interpreted patient's EKG which shows abnormal P wave axis, TW inversions in 1 and aVL with a left axis deviation sinus rhythm with PACs (suspect lead reversal)  Repeat EKG shows sinus rhythm with low voltage and normal axis PVC no acute ischemic changes   RADIOLOGY I reviewed and interpreted patient's chest x-ray which shows resolution of previous right-sided pneumonia   PROCEDURES:  Critical Care performed: No  Procedures  The patient is on the cardiac monitor to evaluate for evidence of arrhythmia and/or significant heart rate changes.   MEDICATIONS ORDERED IN ED: Medications  lactated ringers bolus 1,000 mL (0 mLs Intravenous Stopped 11/05/22 1139)  predniSONE (DELTASONE) tablet 5 mg (5 mg Oral Given 11/05/22 1101)     IMPRESSION / MDM / ASSESSMENT AND PLAN / ED COURSE  I reviewed the triage vital signs and the nursing notes.                              Patient's presentation is most consistent with acute complicated illness / injury requiring diagnostic workup.  Differential diagnosis includes, but is not limited to, electrolyte abnormality, side effects of prednisone withdrawal, adrenal insufficiency, recurrent malignancy, ACS  The patient is an 89 year old male with history of bladder cancer currently not getting treatment who presents because  of weakness and decreased appetite.  He was hospitalized around the first week of May about 2 weeks ago for pneumonia.  Completed a course of antibiotics and was discharged.  He had been on 5 mg of prednisone for quite a while and after hospitalization was on 40 mg for several days and advised to go back to 5 mg after completing the higher dose.  He did not get the prednisone refilled by his oncologist who was concerned about possible increased infection potential.  He has not been on the prednisone for several weeks as a result.  Presents today because of ongoing fatigue and some low back pain.  Tells me this has been going on since he left the hospital does not seem to have his energy back and has had poor appetite.  When he walks around he will have some discomfort in his low back that is primarily on the right side but does cross the midline.  Denies any focal numbness tingling or weakness.  The patient initially denied chest pain but he later tells me he does have some tightness on the right side of his chest since PNA dx.  Denies fevers chills urinary symptoms or GI symptoms.  Patient's vital signs are overall reassuring.  He looks well on exam, neurologic exam is nonfocal has chronic right-sided facial weakness but this is unchanged today.  Abdominal exam is benign and lungs are clear.  He is afebrile with normal O2 sat.  My suspicion for ongoing pneumonia clinically is low.  Will repeat x-ray but would not be totally surprised if did not have complete imaging resolution.  I question whether this could be a side effect of being off the prednisone.  Blood pressure is normal I have low suspicion for adrenal crisis at this time.  Will check electrolytes and CBC.  Patient's EKG does have some abnormalities with new T wave inversions in I and aVL but the P wave axis is also off so question whether there has been lead reversal so I have asked nursing to repeat.    Repeat EKG with normalized P wave axis and  resolution of lateral T wave inversions likely from lead misplacement.  Troponin negative.  Patient's chest x-ray shows resolution of pneumonia.  He does have mild increase in his creatinine is 2.86 from baseline of around 2.  Suspect this is likely in the setting of poor p.o. intake and will give a bolus of fluid.  CBC shows improving anemia.  No leukocytosis.  Discussed findings with the patient and his son.  Recommended that he push fluids at home.  They are interested in restarting the prednisone so I will prescribe 5 mg dose but recommend he discuss with Dr. B about the long-term risks of being on prednisone, whether there is a better option for appetite stimulation.   FINAL CLINICAL IMPRESSION(S) / ED DIAGNOSES   Final diagnoses:  Generalized weakness  AKI (acute kidney injury) (HCC)     Rx / DC Orders   ED Discharge Orders          Ordered    predniSONE (DELTASONE) 5 MG tablet  Daily with breakfast        11/05/22 1126             Note:  This document was prepared using Dragon voice recognition software and may include unintentional dictation errors.   Georga Hacking, MD 11/05/22 [phone]

## 2022-11-07 ENCOUNTER — Encounter: Payer: Self-pay | Admitting: Internal Medicine

## 2022-11-09 ENCOUNTER — Ambulatory Visit: Payer: Medicare HMO | Admitting: Urology

## 2022-11-09 ENCOUNTER — Encounter: Payer: Self-pay | Admitting: Urology

## 2022-11-09 VITALS — BP 134/64 | HR 84 | Ht 66.0 in | Wt 138.0 lb

## 2022-11-09 DIAGNOSIS — C7910 Secondary malignant neoplasm of unspecified urinary organs: Secondary | ICD-10-CM

## 2022-11-09 DIAGNOSIS — Z8551 Personal history of malignant neoplasm of bladder: Secondary | ICD-10-CM

## 2022-11-09 LAB — URINALYSIS, COMPLETE
Bilirubin, UA: NEGATIVE
Glucose, UA: NEGATIVE
Ketones, UA: NEGATIVE
Leukocytes,UA: NEGATIVE
Nitrite, UA: NEGATIVE
RBC, UA: NEGATIVE
Specific Gravity, UA: 1.01 (ref 1.005–1.030)
Urobilinogen, Ur: 0.2 mg/dL (ref 0.2–1.0)
pH, UA: 6 (ref 5.0–7.5)

## 2022-11-09 LAB — MICROSCOPIC EXAMINATION

## 2022-11-09 NOTE — Progress Notes (Signed)
   11/09/22  CC:  Chief Complaint  Patient presents with   Cysto    Indications: History of urothelial carcinoma the bladder  status post TURBT 01/25/2018 for a 4 cm bladder mass and bilateral hydronephrosis.  He was noted to have moderate right hydronephrosis and left hydronephrosis and had bilateral stents placed.   He was subsequently found to have metastatic disease to the retroperitoneal lymph nodes and bulky retroperitoneal adenopathy as well as mediastinal uptake and uptake in the right pubic rami.  Treated with palliative therapy and oncology and did not follow-up with urology until 12/2018.   Cystoscopy with bilateral retrograde pyelograms showed no persistent hydronephrosis and stents were removed.  No evidence of urothelial recurrence Underwent cystoscopy with bladder biopsies 09/2020 for mucosal erythema with pathology returning reactive lymphoid aggregates with inflammatory changes.  HPI: Edward Cummings has no complaints today.  He denies gross hematuria.  Hospitalized earlier this month for pneumonia.  Last visit with Dr. Donneta Romberg was April 2024.  Last Nivolumab was January 2022 and currently on surveillance with imaging only if symptomatic.  Blood pressure (!) 157/80, pulse (!) 105, height 5\' 8"  (1.727 m), weight 131 lb (59.4 kg). NED. A&Ox3.   No respiratory distress   Abd soft, NT, ND Normal phallus with bilateral descended testicles  Cystoscopy Procedure Note  Patient identification was confirmed, informed consent was obtained, and patient was prepped using Betadine solution.  Lidocaine jelly was administered per urethral meatus.     Pre-Procedure: - Inspection reveals a normal caliber urethral meatus.  Procedure: The flexible cystoscope was introduced without difficulty - No urethral strictures/lesions are present. -  Moderate lateral lobe enlargement  prostate  -  Moderate elevation  bladder neck - Bilateral ureteral orifices identified; right resected - Bladder  mucosa  reveals no ulcers, tumors, or lesions - No bladder stones - No trabeculation  Retroflexion shows no abnormalities   Post-Procedure: - Patient tolerated the procedure well  Assessment/ Plan: No mucosal abnormalities noted Surveillance cystoscopy 6 months   Edward Altes, MD

## 2022-11-23 ENCOUNTER — Telehealth: Payer: Self-pay

## 2022-11-23 NOTE — Telephone Encounter (Signed)
Copied from CRM [id number]. Topic: Appointment Scheduling - Scheduling Inquiry for Clinic >> Nov 22, 2022  3:44 PM Carrielelia G wrote: Reason for CRM:  pt is the father of Nathanuel Cabreja a patient of yours,  Casimiro Needle would like to know if you could see his father sooner then December. Please advise

## 2022-11-23 NOTE — Telephone Encounter (Signed)
I can see him Thursday, June 20 at 3 PM if he would like.  If this works, go ahead and schedule him for a new patient appointment at that time.

## 2022-11-24 ENCOUNTER — Other Ambulatory Visit: Payer: Self-pay

## 2022-11-24 ENCOUNTER — Telehealth: Payer: Self-pay | Admitting: Internal Medicine

## 2022-11-24 DIAGNOSIS — D649 Anemia, unspecified: Secondary | ICD-10-CM

## 2022-11-24 DIAGNOSIS — E032 Hypothyroidism due to medicaments and other exogenous substances: Secondary | ICD-10-CM

## 2022-11-24 DIAGNOSIS — C678 Malignant neoplasm of overlapping sites of bladder: Secondary | ICD-10-CM

## 2022-11-24 NOTE — Telephone Encounter (Signed)
Spoke to son-complained about lack of fatigue since recent hospitalization from pneumonia.  Patient currently on prednisone/history of possible adrenal deficiency.  # Please schedule follow-up early next week-APP; labs CBC CMP; Thyroid profile- Dr.B

## 2022-11-24 NOTE — Telephone Encounter (Signed)
Labs entered.

## 2022-11-28 ENCOUNTER — Encounter: Payer: Self-pay | Admitting: Internal Medicine

## 2022-11-28 ENCOUNTER — Inpatient Hospital Stay: Payer: Medicare HMO | Attending: Internal Medicine

## 2022-11-28 ENCOUNTER — Encounter: Payer: Self-pay | Admitting: Nurse Practitioner

## 2022-11-28 ENCOUNTER — Inpatient Hospital Stay (HOSPITAL_BASED_OUTPATIENT_CLINIC_OR_DEPARTMENT_OTHER): Payer: Medicare HMO | Admitting: Nurse Practitioner

## 2022-11-28 VITALS — BP 155/73 | HR 71 | Temp 97.5°F | Wt 134.4 lb

## 2022-11-28 DIAGNOSIS — C679 Malignant neoplasm of bladder, unspecified: Secondary | ICD-10-CM | POA: Insufficient documentation

## 2022-11-28 DIAGNOSIS — Z7952 Long term (current) use of systemic steroids: Secondary | ICD-10-CM | POA: Diagnosis not present

## 2022-11-28 DIAGNOSIS — Z87891 Personal history of nicotine dependence: Secondary | ICD-10-CM | POA: Insufficient documentation

## 2022-11-28 DIAGNOSIS — D631 Anemia in chronic kidney disease: Secondary | ICD-10-CM

## 2022-11-28 DIAGNOSIS — Z79899 Other long term (current) drug therapy: Secondary | ICD-10-CM | POA: Diagnosis not present

## 2022-11-28 DIAGNOSIS — N184 Chronic kidney disease, stage 4 (severe): Secondary | ICD-10-CM

## 2022-11-28 DIAGNOSIS — Z7982 Long term (current) use of aspirin: Secondary | ICD-10-CM | POA: Insufficient documentation

## 2022-11-28 DIAGNOSIS — C678 Malignant neoplasm of overlapping sites of bladder: Secondary | ICD-10-CM

## 2022-11-28 DIAGNOSIS — E032 Hypothyroidism due to medicaments and other exogenous substances: Secondary | ICD-10-CM | POA: Insufficient documentation

## 2022-11-28 DIAGNOSIS — R5383 Other fatigue: Secondary | ICD-10-CM | POA: Diagnosis not present

## 2022-11-28 DIAGNOSIS — N400 Enlarged prostate without lower urinary tract symptoms: Secondary | ICD-10-CM | POA: Diagnosis not present

## 2022-11-28 DIAGNOSIS — I129 Hypertensive chronic kidney disease with stage 1 through stage 4 chronic kidney disease, or unspecified chronic kidney disease: Secondary | ICD-10-CM | POA: Insufficient documentation

## 2022-11-28 DIAGNOSIS — C772 Secondary and unspecified malignant neoplasm of intra-abdominal lymph nodes: Secondary | ICD-10-CM | POA: Insufficient documentation

## 2022-11-28 DIAGNOSIS — D649 Anemia, unspecified: Secondary | ICD-10-CM

## 2022-11-28 LAB — CBC WITH DIFFERENTIAL (CANCER CENTER ONLY)
Abs Immature Granulocytes: 0.06 10*3/uL (ref 0.00–0.07)
Basophils Absolute: 0.1 10*3/uL (ref 0.0–0.1)
Basophils Relative: 1 %
Eosinophils Absolute: 0.5 10*3/uL (ref 0.0–0.5)
Eosinophils Relative: 4 %
HCT: 32.9 % — ABNORMAL LOW (ref 39.0–52.0)
Hemoglobin: 11 g/dL — ABNORMAL LOW (ref 13.0–17.0)
Immature Granulocytes: 1 %
Lymphocytes Relative: 15 %
Lymphs Abs: 1.9 10*3/uL (ref 0.7–4.0)
MCH: 32.3 pg (ref 26.0–34.0)
MCHC: 33.4 g/dL (ref 30.0–36.0)
MCV: 96.5 fL (ref 80.0–100.0)
Monocytes Absolute: 0.6 10*3/uL (ref 0.1–1.0)
Monocytes Relative: 4 %
Neutro Abs: 9.6 10*3/uL — ABNORMAL HIGH (ref 1.7–7.7)
Neutrophils Relative %: 75 %
Platelet Count: 209 10*3/uL (ref 150–400)
RBC: 3.41 MIL/uL — ABNORMAL LOW (ref 4.22–5.81)
RDW: 13 % (ref 11.5–15.5)
WBC Count: 12.7 10*3/uL — ABNORMAL HIGH (ref 4.0–10.5)
nRBC: 0 % (ref 0.0–0.2)

## 2022-11-28 LAB — CMP (CANCER CENTER ONLY)
ALT: 18 U/L (ref 0–44)
AST: 22 U/L (ref 15–41)
Albumin: 4.2 g/dL (ref 3.5–5.0)
Alkaline Phosphatase: 51 U/L (ref 38–126)
Anion gap: 8 (ref 5–15)
BUN: 48 mg/dL — ABNORMAL HIGH (ref 8–23)
CO2: 25 mmol/L (ref 22–32)
Calcium: 9.6 mg/dL (ref 8.9–10.3)
Chloride: 104 mmol/L (ref 98–111)
Creatinine: 2.81 mg/dL — ABNORMAL HIGH (ref 0.61–1.24)
GFR, Estimated: 21 mL/min — ABNORMAL LOW (ref >60)
Glucose, Bld: 102 mg/dL — ABNORMAL HIGH (ref 70–99)
Potassium: 4.4 mmol/L (ref 3.5–5.1)
Sodium: 137 mmol/L (ref 135–145)
Total Bilirubin: 0.6 mg/dL (ref 0.3–1.2)
Total Protein: 7.1 g/dL (ref 6.5–8.1)

## 2022-11-28 LAB — IRON AND TIBC
Iron: 96 ug/dL (ref 45–182)
Saturation Ratios: 33 % (ref 17.9–39.5)
TIBC: 294 ug/dL (ref 250–450)
UIBC: 198 ug/dL

## 2022-11-28 LAB — FERRITIN: Ferritin: 429 ng/mL — ABNORMAL HIGH (ref 24–336)

## 2022-11-28 NOTE — Progress Notes (Signed)
[facility] CONSULT NOTE  Patient Care Team: Corky Downs, MD as PCP - General (Internal Medicine) Earna Coder, MD as Consulting Physician (Hematology and Oncology) Pasty Spillers, MD (Inactive) as Consulting Physician (Gastroenterology) Geanie Logan, MD as Referring Physician (Otolaryngology) Vida Rigger, MD as Consulting Physician (Pulmonary Disease) Riki Altes, MD (Urology)  CHIEF COMPLAINTS/PURPOSE OF CONSULTATION: Bladder cancer   Oncology History Overview Note  # AUG 2019-TRANSITIONAL CELL BLADDER CA [~ 4cm tumor] s/p cystoscopy [Dr.Stoiff]  with extensive angiolymphatic invasion; [name]; [name]; mediastinal uptake; right pubic rami uptake.  # [id number] Neas; Jan 18th 2021- switched to Opdivo- [pt preference; [name]; July 2020 cystoscopy-no evidence of bladder malignancy/Dr. Lonna Cobb- enlarged prostate [PSA- 0.95; 2021]  # Molecular testing- PDL-1 CPS- 20%; [name]  ;GOALS: palliative  CURRENT/MOST RECENT THERAPY:OPDIVO [C]     Cancer of overlapping sites of bladder (HCC)  02/28/2018 - 06/09/2019 Chemotherapy   The patient had atezolizumab (TECENTRIQ) 1,[name], 1,200 mg, Intravenous, Once, [name],200 mg (02/28/2018), 1,200 mg (03/21/2018), 1,200 mg (04/11/2018), 1,200 mg (05/02/2018), 1,200 mg (06/13/2018), 1,200 mg (05/23/2018), 1,200 mg (07/04/2018), 1,200 mg (07/25/2018), 1,200 mg (08/15/2018), 1,200 mg (09/05/2018), 1,200 mg (10/25/2018), 1,200 mg (11/22/2018), 1,200 mg (12/20/2018), 1,200 mg (01/10/2019), 1,200 mg (01/31/2019), 1,200 mg (02/21/2019), 1,200 mg (03/14/2019), 1,200 mg  (04/04/2019), 1,200 mg (04/25/2019), 1,200 mg (05/16/2019), 1,200 mg (06/09/2019)  for chemotherapy treatment.    06/30/2019 - 06/17/2020 Chemotherapy   Patient is on Treatment Plan : BLADDER Nivolumab q14d      HISTORY OF PRESENTING ILLNESS: Patient ambulating independently.  Accompanied by his son.   Edward Cummings 89 y.o. male with metastatic transitional carcinoma of the bladder most recently on Opdivo [currently on hold because of poor tolerance] who presents to clinic for complaints of fatigue, lethargy, and poor appetite.   In interim, early May 2024, patient was admitted to hospital for pneumonia. He had been taking prednisone for lethargy and appetite which was discontinued d/t infection.  Review of Systems  Constitutional:  Positive for malaise/fatigue. Negative for chills, diaphoresis and fever.  HENT:  Negative for nosebleeds and sore throat.   Eyes:  Negative for double vision.  Respiratory:  Negative for hemoptysis, sputum production and wheezing.   Cardiovascular:  Negative for chest pain, palpitations, orthopnea and leg swelling.  Gastrointestinal:  Negative for abdominal pain, blood in stool, diarrhea, heartburn, melena, nausea and vomiting.  Genitourinary:  Negative for dysuria.  Musculoskeletal:  Positive for back pain and joint pain.  Skin: Negative.  Negative for itching and rash.  Neurological:  Negative for tingling, focal weakness and headaches.  Endo/Heme/Allergies:  Does not bruise/bleed easily.  Psychiatric/Behavioral:  Negative for depression. The patient is not nervous/anxious and does not have insomnia.      MEDICAL HISTORY:  Past Medical History:  Diagnosis Date   Anemia    Anxiety 12/31/2019   Cancer [facility])    bladder   Chronic kidney disease    Depression    Hypertension    Neuromuscular disorder (HCC)    Nerve damage to left face/eye since around 2002.    SURGICAL HISTORY: Past Surgical History:  Procedure Laterality Date   CYSTOSCOPY W/  RETROGRADES Bilateral 01/25/2018   Procedure: CYSTOSCOPY WITH RETROGRADE PYELOGRAM;  Surgeon: Riki Altes, MD;  [name];  Service: Urology;  Laterality: Bilateral;   CYSTOSCOPY W/ URETERAL STENT PLACEMENT Bilateral 01/06/2019   Procedure: CYSTOSCOPY WITH RETROGRADE PYELOGRAM/URETERAL STENT REMOVAL;  Surgeon: Riki Altes, MD;  [name];  Service: Urology;  Laterality: Bilateral;   CYSTOSCOPY WITH BIOPSY N/A 09/21/2020   Procedure: CYSTOSCOPY WITH BLADDER BIOPSY;  Surgeon: Riki Altes, MD;  [name];  Service: Urology;  [name];   CYSTOSCOPY WITH FULGERATION N/A 09/21/2020   Procedure: CYSTOSCOPY WITH FULGERATION;  Surgeon: Riki Altes, MD;  [name];  Service: Urology;  [name];   CYSTOSCOPY WITH STENT PLACEMENT Bilateral 01/25/2018   Procedure: CYSTOSCOPY WITH STENT PLACEMENT;  Surgeon: Riki Altes, MD;  [name];  Service: Urology;  Laterality: Bilateral;   DORSAL SLIT N/A 01/06/2019   Procedure: DORSAL SLIT;  Surgeon: Riki Altes, MD;  [name];  Service: Urology;  [name];   ESOPHAGOGASTRODUODENOSCOPY (EGD) WITH PROPOFOL N/A 11/12/2019   Procedure: ESOPHAGOGASTRODUODENOSCOPY (EGD) WITH PROPOFOL;  Surgeon: Pasty Spillers, MD;  Location: [facility] ENDOSCOPY;  Service: Endoscopy;  [name];   EYE SURGERY     Cornea transplants bilaterally & cataract surgery.   TRANSURETHRAL RESECTION OF BLADDER TUMOR N/A 01/25/2018   Procedure: TRANSURETHRAL RESECTION OF BLADDER TUMOR (TURBT);  Surgeon: Riki Altes, MD;  [name];  Service: Urology;  [name];    SOCIAL HISTORY: Social History   Socioeconomic History   Marital status: Widowed    Spouse name: Talbert Forest   Number of children: 2   Years of education: 6th grade   Highest education level: 6th grade  Occupational History   Not on file  Tobacco Use   Smoking status: Former    Types: Cigarettes    Quit date:  January 11, 2009    Years since quitting: 14.4   Smokeless tobacco: Current    Types: Chew   Tobacco comments:    Stopped approximately 10 years ago.  Vaping Use   Vaping Use: Never used  Substance and Sexual Activity   Alcohol use: Yes    Alcohol/week: 2.0 standard drinks of alcohol    Types: 2 Cans of beer per week    Comment: occasionally   Drug use: Never   Sexual activity: Not Currently    Birth control/protection: None  Other Topics Concern   Not on file  Social History Narrative    lives in [place]; with oldest son. Wife died in 2019/01/12.  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 Pt still drives and is indepent with no serious chronic disease. Worked in [employer]    Social Determinants of Health   Financial Resource Strain: Low Risk  (10/20/2022)   Overall Financial Resource Strain (CARDIA)    Difficulty of Paying Living Expenses: Not very hard  Food Insecurity: No Food Insecurity (10/17/2022)   Hunger Vital Sign    Worried About Running Out of Food in the Last Year: Never true    Ran Out of Food in the Last Year: Never true  Transportation Needs: No Transportation Needs (10/20/2022)   PRAPARE - Administrator, Civil Service (Medical): No    Lack of Transportation (Non-Medical): No  Physical Activity: Insufficiently Active (05/27/2021)   Exercise Vital Sign    Days of Exercise per  Week: 5 days    Minutes of Exercise per Session: 10 min  Stress: No Stress Concern Present (05/27/2021)   [employer] of Occupational Health - Occupational Stress Questionnaire    Feeling of Stress : Only a little  Social Connections: Unknown (05/27/2021)   Social Connection and Isolation Panel [NHANES]    Frequency of Communication with Friends and Family: More than three times a week    Frequency of Social Gatherings with Friends and Family: Three times a week    Attends Religious Services: Not on file    Active Member of Clubs  or Organizations: Not on file    Attends Club or Organization Meetings: Not on file    Marital Status: Widowed  Intimate Partner Violence: Not At Risk (10/17/2022)   Humiliation, Afraid, Rape, and Kick questionnaire    Fear of Current or Ex-Partner: No    Emotionally Abused: No    Physically Abused: No    Sexually Abused: No    FAMILY HISTORY: Family History  Problem Relation Age of Onset   Prostate cancer Neg Hx    Kidney cancer Neg Hx    Bladder Cancer Neg Hx     ALLERGIES:  has No Known Allergies.  MEDICATIONS:  Current Outpatient Medications  Medication Sig Dispense Refill   amLODipine (NORVASC) 10 MG tablet TAKE ONE TABLET BY MOUTH ONCE DAILY 90 tablet 1   aspirin EC 81 MG tablet Take 81 mg by mouth daily.     azithromycin (ZITHROMAX) 500 MG tablet Take 1 tablet (500 mg total) by mouth daily. 3 tablet 0   cefdinir (OMNICEF) 300 MG capsule Take 1 capsule (300 mg total) by mouth daily. 3 capsule 0   dextromethorphan-guaiFENesin (MUCINEX DM) 30-600 MG 12hr tablet Take 1 tablet by mouth 2 (two) times daily as needed for cough. 30 tablet 0   Ipratropium-Albuterol (COMBIVENT RESPIMAT) 20-100 MCG/ACT AERS respimat Inhale 1 puff into the lungs every 6 (six) hours as needed for wheezing or shortness of breath. 4 g 0   levothyroxine (SYNTHROID) 75 MCG tablet TAKE 1 TABLET BY MOUTH ONCE A DAY BEFOREBREAKFAST. 30 tablet 3   loratadine (CLARITIN) 10 MG tablet TAKE 1 TABLET BY MOUTH ONCE A DAY 30 tablet 11   mometasone (NASONEX) 50 MCG/ACT nasal spray PLACE 2 SPRAYS INTO THE NOSE DAILY 17 g 3   Multiple Vitamin (MULTIVITAMIN WITH MINERALS) TABS tablet Take 1 tablet by mouth daily.      ondansetron (ZOFRAN) 4 MG tablet Take 1 tablet (4 mg total) by mouth daily as needed for nausea or vomiting. 30 tablet 0   prednisoLONE acetate (PRED FORTE) 1 % ophthalmic suspension Place 1 drop into both eyes daily at 2 PM. 5 mL 4   predniSONE (DELTASONE) 5 MG tablet TAKE ONE TABLET BY MOUTH ONCE A DAY WITH  BREAKFAST. 60 tablet 1   No current facility-administered medications for this visit.    PHYSICAL EXAMINATION: ECOG PERFORMANCE STATUS: 1 - Symptomatic but completely ambulatory  Vitals:   11/28/22 1344  BP: (!) 155/73  Pulse: 71  Temp: (!) 97.5 F (36.4 C)  SpO2: 100%   Filed Weights   11/28/22 1344  Weight: 134 lb 6.4 oz (61 kg)   Physical Exam Constitutional:      Comments: Accompanied by son. Ambulating w/o aids.   HENT:     Head: Normocephalic and atraumatic.  Eyes:     Comments: Chronic drooping of the left eyelid.  Cardiovascular:     Rate and Rhythm:  Normal rate and regular rhythm.  Pulmonary:     Effort: No respiratory distress.     Breath sounds: No wheezing.  Abdominal:     General: There is no distension.     Palpations: Abdomen is soft.     Tenderness: There is no abdominal tenderness. There is no guarding.  Musculoskeletal:        General: No tenderness.     Cervical back: Normal range of motion and neck supple.  Skin:    General: Skin is warm.     Coloration: Skin is not pale.  Neurological:     Mental Status: He is alert and oriented to person, place, and time.  Psychiatric:        Mood and Affect: Mood and affect normal.    LABORATORY DATA:  I have reviewed the data as listed Lab Results  Component Value Date   WBC 12.7 (H) 11/28/2022   HGB 11.0 (L) 11/28/2022   HCT 32.9 (L) 11/28/2022   MCV 96.5 11/28/2022   PLT 209 11/28/2022   Recent Labs    10/18/22 0736 10/19/22 0556 11/05/22 0940  NA 139 137 135  K 4.3 4.6 4.4  CL 113* 107 104  CO2 22 22 26   GLUCOSE 83 80 130*  BUN 34* 28* 42*  CREATININE 2.05* 2.00* 2.86*  CALCIUM 8.7* 9.0 9.6  GFRNONAA 31* 32* 21*  PROT 5.7* 5.8* 6.8  ALBUMIN 3.1* 3.1* 3.6  AST 21 22 25   ALT 14 14 17   ALKPHOS 45 47 62  BILITOT 0.8 0.7 0.9     RADIOGRAPHIC STUDIES: I have personally reviewed the radiological images as listed and agreed with the findings in the report. DG Chest 2 View  Result  Date: 11/05/2022 CLINICAL DATA:  Weakness, shortness of breath, and right-sided chest pain. EXAM: CHEST - 2 VIEW COMPARISON:  Chest x-ray dated Oct 17, 2022. FINDINGS: Normal heart size. Normal pulmonary vascularity. The lungs remain hyperinflated. No focal consolidation, pleural effusion, or pneumothorax. Previously seen consolidation in the right lung has resolved. No acute osseous abnormality. IMPRESSION: 1. No acute cardiopulmonary disease.  Resolved right lung pneumonia. 2. COPD. Electronically Signed   By: Obie Dredge M.D.   On: 11/05/2022 10:01     ASSESSMENT & PLAN:   # High grade transitional cell carcinoma of the bladder metastatic to retroperitoneal lymph node. Stage IV- MARCH 26 th, 2024- No significant changes, or evidence of new masses, lesions, or lymphadenopathy. Last received immunotherapy January 2022. Plan is to repeat imaging, only if symptomatic.  # History of locally recurrent bladder cancer- < 1 cm, TURBT March 2022. Dr Lonna Cobb cystoscopy Nov 2023. S/p evaluation with urology- negative. Clinically asymptomatic.   # Weakness- likely multifactorial. Hmg improved. No IDA. TSH normal. Vit D normal. B12 normal. Albumin normal. Weight stable. Post-hospitalization for pneumonia in May 2024. Gradually improving. Monitor.   # Pulmonary- Sep 2022 CT consistent with emphysema/bronchial thickening/bilateral basilar ground glass opacities. Resolving BOOP/fibrosis vs fungal infection (s/p itraconazole w/ Dr Karna Christmas). March 2024 CT pleural plaques.   # Iatrogenic hypothyroidism- on Synthroid 100 mcg daily. TSH normal. Stable.    # Anemia secondary to Stage IV Kidney Disease- Hemoglobin improved to 11. Ferritin increased, likely reactive. Iron sat 33%. No role for oral or iv iron currently. If hemoglobin drops below 10, consider starting EPO. Hold for now.    # CKD stage IV- GFR 21. Stable. Continue follow up with nephrology.    # Weight loss - weight 134- 3  lb decreased since April  2024. Continue prednisone 5 mg/day.     # IV access: NO port/PIV   # Incidental findings on Imaging  CT , MARCH 2024: Pleural plaques, atherosclerosis emphysema; prostatomegaly; bladder thickening   DISPOSITION:  Hold venofer Follow up with Dr Donneta Romberg as scheduled. RTC sooner if symptoms do not improve or worsen.   No problem-specific Assessment & Plan notes found for this encounter.  All questions were answered. The patient knows to call the clinic with any problems, questions or concerns.    Alinda Dooms, NP 11/28/2022

## 2022-11-29 LAB — THYROID PANEL WITH TSH
Free Thyroxine Index: 1.7 (ref 1.2–4.9)
T3 Uptake Ratio: 25 % (ref 24–39)
T4, Total: 6.6 ug/dL (ref 4.5–12.0)
TSH: 2.86 u[IU]/mL (ref 0.450–4.500)

## 2022-11-30 ENCOUNTER — Ambulatory Visit (INDEPENDENT_AMBULATORY_CARE_PROVIDER_SITE_OTHER): Payer: Medicare HMO | Admitting: Internal Medicine

## 2022-11-30 ENCOUNTER — Encounter: Payer: Self-pay | Admitting: Internal Medicine

## 2022-11-30 VITALS — BP 134/72 | HR 78 | Temp 97.1°F | Ht 66.0 in | Wt 134.0 lb

## 2022-11-30 DIAGNOSIS — K219 Gastro-esophageal reflux disease without esophagitis: Secondary | ICD-10-CM | POA: Diagnosis not present

## 2022-11-30 DIAGNOSIS — Z8551 Personal history of malignant neoplasm of bladder: Secondary | ICD-10-CM | POA: Diagnosis not present

## 2022-11-30 DIAGNOSIS — F418 Other specified anxiety disorders: Secondary | ICD-10-CM

## 2022-11-30 DIAGNOSIS — I719 Aortic aneurysm of unspecified site, without rupture: Secondary | ICD-10-CM | POA: Diagnosis not present

## 2022-11-30 DIAGNOSIS — I700 Atherosclerosis of aorta: Secondary | ICD-10-CM | POA: Diagnosis not present

## 2022-11-30 DIAGNOSIS — E039 Hypothyroidism, unspecified: Secondary | ICD-10-CM | POA: Diagnosis not present

## 2022-11-30 DIAGNOSIS — I10 Essential (primary) hypertension: Secondary | ICD-10-CM | POA: Diagnosis not present

## 2022-11-30 DIAGNOSIS — N184 Chronic kidney disease, stage 4 (severe): Secondary | ICD-10-CM

## 2022-11-30 DIAGNOSIS — E559 Vitamin D deficiency, unspecified: Secondary | ICD-10-CM

## 2022-11-30 DIAGNOSIS — E782 Mixed hyperlipidemia: Secondary | ICD-10-CM | POA: Diagnosis not present

## 2022-11-30 DIAGNOSIS — D631 Anemia in chronic kidney disease: Secondary | ICD-10-CM | POA: Diagnosis not present

## 2022-11-30 DIAGNOSIS — E538 Deficiency of other specified B group vitamins: Secondary | ICD-10-CM

## 2022-11-30 MED ORDER — OMEPRAZOLE 20 MG PO CPDR
20.0000 mg | DELAYED_RELEASE_CAPSULE | Freq: Every day | ORAL | 1 refills | Status: DC
Start: 2022-11-30 — End: 2023-01-01

## 2022-11-30 NOTE — Assessment & Plan Note (Signed)
Thyroid studies reviewed Continue levothyroxine

## 2022-11-30 NOTE — Assessment & Plan Note (Signed)
Will monitor CTs yearly

## 2022-11-30 NOTE — Assessment & Plan Note (Signed)
Not medicated °We will monitor °

## 2022-11-30 NOTE — Progress Notes (Signed)
HPI  Patient presents to clinic today to establish care and for management of the conditions listed below.  HTN: His BP today is 134/72.  He is taking amlodipine as prescribed.  ECG from 10/2022 reviewed.  History of Bladder Cancer: s/p surgery. He reports he has not had chemo or radiation. He follows with urology and oncology.  CKD: His last creatinine was 2.81, GFR 28, 11/2022.  He is not currently on an ACEI/ARB.  Anxiety and Depression: He is not currently taking any medications for this.  He is not seeing a therapist at this time.  He denies SI/HI.  GERD: He is not sure what triggers this. He takes Prilosec OTC with some relief of symptoms. Upper GI from 11/2019.  Hypothyroidism: He denies any issues on his current dose of levothyroxine.  Anemia of CKD: His last H/H was 11/32.9, 11/2022.  He is not taking any oral iron at this time.  He follows with hematology.  Aortic Aneurysm: CT from 08/2022 reviewed.  He does not follow with vascular.  Aortic Atherosclerosis: There is no lipid panel on file.  He is not taking any cholesterol-lowering medication at this time.  He does not consume a low-fat diet.  Poor Appetite: Managed on Prednisone. This is provided by his oncologist.  Past Medical History:  Diagnosis Date   Anemia    Anxiety 12/31/2019   Cancer [facility])    bladder   Chronic kidney disease    Depression    Hypertension    Neuromuscular disorder (HCC)    Nerve damage to left face/eye since around 2002.    Current Outpatient Medications  Medication Sig Dispense Refill   amLODipine (NORVASC) 10 MG tablet TAKE ONE TABLET BY MOUTH ONCE DAILY 90 tablet 1   aspirin EC 81 MG tablet Take 81 mg by mouth daily.     dextromethorphan-guaiFENesin (MUCINEX DM) 30-600 MG 12hr tablet Take 1 tablet by mouth 2 (two) times daily as needed for cough. 30 tablet 0   Ipratropium-Albuterol (COMBIVENT RESPIMAT) 20-100 MCG/ACT AERS respimat Inhale 1 puff into the lungs every 6 (six) hours as needed  for wheezing or shortness of breath. 4 g 0   levothyroxine (SYNTHROID) 75 MCG tablet TAKE 1 TABLET BY MOUTH ONCE A DAY BEFOREBREAKFAST. 30 tablet 3   loratadine (CLARITIN) 10 MG tablet TAKE 1 TABLET BY MOUTH ONCE A DAY 30 tablet 11   mometasone (NASONEX) 50 MCG/ACT nasal spray PLACE 2 SPRAYS INTO THE NOSE DAILY 17 g 3   Multiple Vitamin (MULTIVITAMIN WITH MINERALS) TABS tablet Take 1 tablet by mouth daily.      ondansetron (ZOFRAN) 4 MG tablet Take 1 tablet (4 mg total) by mouth daily as needed for nausea or vomiting. 30 tablet 0   prednisoLONE acetate (PRED FORTE) 1 % ophthalmic suspension Place 1 drop into both eyes daily at 2 PM. 5 mL 4   predniSONE (DELTASONE) 5 MG tablet TAKE ONE TABLET BY MOUTH ONCE A DAY WITH BREAKFAST. 60 tablet 1   No current facility-administered medications for this visit.    No Known Allergies  Family History  Problem Relation Age of Onset   Prostate cancer Neg Hx    Kidney cancer Neg Hx    Bladder Cancer Neg Hx     Social History   Socioeconomic History   Marital status: Widowed    Spouse name: Talbert Forest   Number of children: 2   Years of education: 6th grade   Highest education level: 6th grade  Occupational History  Not on file  Tobacco Use   Smoking status: Former    Types: Cigarettes    Quit date: 27-Dec-2008    Years since quitting: 14.4   Smokeless tobacco: Current    Types: Chew   Tobacco comments:    Stopped approximately 10 years ago.  Vaping Use   Vaping Use: Never used  Substance and Sexual Activity   Alcohol use: Yes    Alcohol/week: 2.0 standard drinks of alcohol    Types: 2 Cans of beer per week    Comment: occasionally   Drug use: Never   Sexual activity: Not Currently    Birth control/protection: None  Other Topics Concern   Not on file  Social History Narrative    lives in [place]; with oldest son. Wife died in 12-28-2018.  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 Pt still  drives and is indepent with no serious chronic disease. Worked in [employer]    Social Determinants of Health   Financial Resource Strain: Low Risk  (10/20/2022)   Overall Financial Resource Strain (CARDIA)    Difficulty of Paying Living Expenses: Not very hard  Food Insecurity: No Food Insecurity (10/17/2022)   Hunger Vital Sign    Worried About Running Out of Food in the Last Year: Never true    Ran Out of Food in the Last Year: Never true  Transportation Needs: No Transportation Needs (10/20/2022)   PRAPARE - Administrator, Civil Service (Medical): No    Lack of Transportation (Non-Medical): No  Physical Activity: Insufficiently Active (05/27/2021)   Exercise Vital Sign    Days of Exercise per Week: 5 days    Minutes of Exercise per Session: 10 min  Stress: No Stress Concern Present (05/27/2021)   [employer] of Occupational Health - Occupational Stress Questionnaire    Feeling of Stress : Only a little  Social Connections: Unknown (05/27/2021)   Social Connection and Isolation Panel [NHANES]    Frequency of Communication with Friends and Family: More than three times a week    Frequency of Social Gatherings with Friends and Family: Three times a week    Attends Religious Services: Not on file    Active Member of Clubs or Organizations: Not on file    Attends Club or Organization Meetings: Not on file    Marital Status: Widowed  Intimate Partner Violence: Not At Risk (10/17/2022)   Humiliation, Afraid, Rape, and Kick questionnaire    Fear of Current or Ex-Partner: No    Emotionally Abused: No    Physically Abused: No    Sexually Abused: No    ROS:  Constitutional: Pt reports fatigue. Denies fever, malaise, headache or abrupt weight changes.  HEENT: Denies eye pain, eye redness, ear pain, ringing in the ears, wax buildup, runny nose, nasal congestion, bloody nose, or sore throat. Respiratory: Denies difficulty breathing, shortness of breath, cough or sputum  production.   Cardiovascular: Denies chest pain, chest tightness, palpitations or swelling in the hands or feet.  Gastrointestinal: Pt reports reflux. Denies abdominal pain, bloating, constipation, diarrhea or blood in the stool.  GU: Denies frequency, urgency, pain with urination, blood in urine, odor or discharge. Musculoskeletal: Denies decrease in range of motion, difficulty with gait, muscle pain or joint pain and swelling.  Skin: Denies redness, rashes, lesions or ulcercations.  Neurological: Denies dizziness, difficulty with memory, difficulty with speech or problems with balance and coordination.  Psych: Patient has a  history of anxiety and depression.  Denies SI/HI.  No other specific complaints in a complete review of systems (except as listed in HPI above).  PE: BP 134/72 (BP Location: Right Arm, Patient Position: Sitting, Cuff Size: Normal)   Pulse 78   Temp (!) 97.1 F (36.2 C) (Temporal)   Ht 5\' 6"  (1.676 m)   Wt 134 lb (60.8 kg)   SpO2 100%   BMI 21.63 kg/m   Wt Readings from Last 3 Encounters:  11/28/22 134 lb 6.4 oz (61 kg)  11/09/22 138 lb (62.6 kg)  10/17/22 135 lb (61.2 kg)    General: Appears his stated age, chronically ill appearing, in NAD. HEENT: Head: normal shape and size;  Neck: Neck supple, trachea midline. No masses, lumps or thyromegaly present.  Cardiovascular: Normal rate and rhythm. S1,S2 noted.  No murmur, rubs or gallops noted. No JVD or BLE edema. No carotid bruits noted. Pulmonary/Chest: Normal effort and positive vesicular breath sounds. No respiratory distress. No wheezes, rales or ronchi noted.  Abdomen: Soft and nontender. Normal bowel sounds. Musculoskeletal: No difficulty with gait.  Neurological: Alert and oriented. Pt has left side facial droop. Coordination normal.  Psychiatric: Mood and affect normal. Behavior is normal. Judgment and thought content normal.     BMET    Component Value Date/Time   NA 137 11/28/2022 1320   K 4.4  11/28/2022 1320   CL 104 11/28/2022 1320   CO2 25 11/28/2022 1320   GLUCOSE 102 (H) 11/28/2022 1320   BUN 48 (H) 11/28/2022 1320   CREATININE 2.81 (H) 11/28/2022 1320   CALCIUM 9.6 11/28/2022 1320   GFRNONAA 21 (L) 11/28/2022 1320   GFRAA 29 (L) 03/04/2020 0801    Lipid Panel  No results found for: "CHOL", "TRIG", "HDL", "CHOLHDL", "VLDL", "LDLCALC"  CBC    Component Value Date/Time   WBC 12.7 (H) 11/28/2022 1320   WBC 8.5 11/05/2022 0940   RBC 3.41 (L) 11/28/2022 1320   HGB 11.0 (L) 11/28/2022 1320   HCT 32.9 (L) 11/28/2022 1320   PLT 209 11/28/2022 1320   MCV 96.5 11/28/2022 1320   MCH 32.3 11/28/2022 1320   MCHC 33.4 11/28/2022 1320   RDW 13.0 11/28/2022 1320   LYMPHSABS 1.9 11/28/2022 1320   MONOABS 0.6 11/28/2022 1320   EOSABS 0.5 11/28/2022 1320   BASOSABS 0.1 11/28/2022 1320    Hgb A1C Lab Results  Component Value Date   HGBA1C 4.5 (L) 04/13/2018     Assessment and Plan:    RTC in 6 months for your annual exam Nicki Reaper, NP

## 2022-11-30 NOTE — Assessment & Plan Note (Signed)
He will continue to follow with oncology and urology

## 2022-11-30 NOTE — Assessment & Plan Note (Signed)
Controlled on amlodipine  Reinforced DASH diet  

## 2022-11-30 NOTE — Assessment & Plan Note (Signed)
Will start omeprazole 20 mg daily

## 2022-11-30 NOTE — Assessment & Plan Note (Signed)
Lipid profile today Encouraged him to consume a low fat diet 

## 2022-11-30 NOTE — Patient Instructions (Signed)

## 2022-11-30 NOTE — Assessment & Plan Note (Signed)
CBC reviewed Following with oncology

## 2022-11-30 NOTE — Assessment & Plan Note (Signed)
Referral to nephrology placed. 

## 2022-12-01 ENCOUNTER — Encounter: Payer: Self-pay | Admitting: Internal Medicine

## 2022-12-01 LAB — VITAMIN B12: Vitamin B-12: 533 pg/mL (ref 200–1100)

## 2022-12-01 LAB — LIPID PANEL
Cholesterol: 212 mg/dL — ABNORMAL HIGH (ref <200)
HDL: 60 mg/dL (ref >=40)
LDL Cholesterol (Calc): 125 mg/dL (calc) — ABNORMAL HIGH
Non-HDL Cholesterol (Calc): 152 mg/dL (calc) — ABNORMAL HIGH (ref <130)
Total CHOL/HDL Ratio: 3.5 (calc) (ref <5.0)
Triglycerides: 157 mg/dL — ABNORMAL HIGH (ref <150)

## 2022-12-01 LAB — VITAMIN D 25 HYDROXY (VIT D DEFICIENCY, FRACTURES): Vit D, 25-Hydroxy: 45 ng/mL (ref 30–100)

## 2022-12-01 MED ORDER — ATORVASTATIN CALCIUM 10 MG PO TABS
10.0000 mg | ORAL_TABLET | Freq: Every day | ORAL | 1 refills | Status: DC
Start: 2022-12-01 — End: 2023-05-29

## 2022-12-01 NOTE — Addendum Note (Signed)
Addended by: Lorre Munroe on: 12/01/2022 09:41 AM   Modules accepted: Orders

## 2022-12-07 ENCOUNTER — Other Ambulatory Visit: Payer: Self-pay | Admitting: Internal Medicine

## 2022-12-07 DIAGNOSIS — I10 Essential (primary) hypertension: Secondary | ICD-10-CM

## 2022-12-08 NOTE — Telephone Encounter (Signed)
Requested medication (s) are due for refill today: yes  Requested medication (s) are on the active medication list: yes  Last refill:  01/10/22  Future visit scheduled: yes  Notes to clinic:  Unable to refill per protocol, last refill by another provider. Routing for review.     Requested Prescriptions  Pending Prescriptions Disp Refills   mometasone (NASONEX) 50 MCG/ACT nasal spray [Pharmacy Med Name: MOMETASONE FUROATE SUSPENSION]  3    Sig: PLACE TWO SPRAYS INTO THE NOSE DAILY     Ear, Nose, and Throat: Nasal Preparations - Corticosteroids Passed - 12/07/2022  9:28 AM      Passed - Valid encounter within last 12 months    Recent Outpatient Visits           1 week ago Vitamin D deficiency   [facility] [place], Salvadore Oxford, NP       Future Appointments             In 5 months Baity, Salvadore Oxford, NP [facility], [facility]

## 2022-12-26 ENCOUNTER — Encounter: Payer: Self-pay | Admitting: Internal Medicine

## 2022-12-27 ENCOUNTER — Encounter: Payer: Self-pay | Admitting: Internal Medicine

## 2023-01-01 ENCOUNTER — Ambulatory Visit: Payer: Self-pay | Admitting: *Deleted

## 2023-01-01 ENCOUNTER — Ambulatory Visit: Payer: Medicare HMO | Admitting: Internal Medicine

## 2023-01-01 ENCOUNTER — Encounter: Payer: Self-pay | Admitting: Internal Medicine

## 2023-01-01 VITALS — BP 130/62 | HR 80 | Ht 66.0 in | Wt 131.0 lb

## 2023-01-01 DIAGNOSIS — W548XXA Other contact with dog, initial encounter: Secondary | ICD-10-CM

## 2023-01-01 DIAGNOSIS — R1310 Dysphagia, unspecified: Secondary | ICD-10-CM

## 2023-01-01 DIAGNOSIS — Z23 Encounter for immunization: Secondary | ICD-10-CM | POA: Diagnosis not present

## 2023-01-01 DIAGNOSIS — K219 Gastro-esophageal reflux disease without esophagitis: Secondary | ICD-10-CM

## 2023-01-01 DIAGNOSIS — T148XXA Other injury of unspecified body region, initial encounter: Secondary | ICD-10-CM | POA: Diagnosis not present

## 2023-01-01 MED ORDER — OMEPRAZOLE 40 MG PO CPDR: 40.0000 mg | DELAYED_RELEASE_CAPSULE | Freq: Every day | ORAL | 1 refills | Status: DC

## 2023-01-01 MED ORDER — AMOXICILLIN-POT CLAVULANATE 875-125 MG PO TABS: 1.0000 | ORAL_TABLET | Freq: Two times a day (BID) | ORAL | 0 refills | Status: DC

## 2023-01-01 NOTE — Telephone Encounter (Signed)
ummary: medication clarity for patient   Patient's son Edward Cummings has called in regards to PCP has changed his acid reflux medication to 40MG  and Edward Cummings states patient wants to know if he needs to stop taking all the other acid reflux medications too, please advise.  Juliann Pulse Edward Cummings) # [phone]      Called patient's son [phone] on DPR, to review questions regarding medications. No answer, LVMTCB [phone].

## 2023-01-01 NOTE — Assessment & Plan Note (Addendum)
New onset dysphagia Increase omeprazole to 40 mg daily, take to 20 mg tablets until you run out

## 2023-01-01 NOTE — Progress Notes (Signed)
Subjective:    Patient ID: Edward Cummings, male    DOB: [birth year], 89 y.o.   MRN: [id number]  HPI  Patient presents to the clinic today with complaint of difficulty swallowing.  He noticed this a few weeks ago.  He reports it seems worse with tomato-based or acidic foods.  He reports it seems to get caught at the end of his esophagus just above his stomach.  He has a history of GERD with a hiatal hernia managed on omeprazole 20 mg daily.  Upper GI from 11/2019 reviewed.  He also reports a dog scratch to his right forearm.  He reports this occurred just prior to arrival.  He reports he cleansed the area with hydrogen peroxide.  There is no tetanus on file.  Review of Systems     Past Medical History:  Diagnosis Date   Anemia    Anxiety 12/31/2019   Cancer [facility])    bladder   Chronic kidney disease    Depression    Hypertension    Neuromuscular disorder (HCC)    Nerve damage to left face/eye since around 2002.    Current Outpatient Medications  Medication Sig Dispense Refill   amLODipine (NORVASC) 10 MG tablet TAKE ONE TABLET BY MOUTH ONCE DAILY 90 tablet 1   aspirin EC 81 MG tablet Take 81 mg by mouth daily.     atorvastatin (LIPITOR) 10 MG tablet Take 1 tablet (10 mg total) by mouth daily. 90 tablet 1   dextromethorphan-guaiFENesin (MUCINEX DM) 30-600 MG 12hr tablet Take 1 tablet by mouth 2 (two) times daily as needed for cough. 30 tablet 0   Ipratropium-Albuterol (COMBIVENT RESPIMAT) 20-100 MCG/ACT AERS respimat Inhale 1 puff into the lungs every 6 (six) hours as needed for wheezing or shortness of breath. 4 g 0   levothyroxine (SYNTHROID) 75 MCG tablet TAKE ONE TABLET BY MOUTH EVERY MORNING BEFORE BREAKFAST 30 tablet 3   loratadine (CLARITIN) 10 MG tablet TAKE 1 TABLET BY MOUTH ONCE A DAY 30 tablet 11   mometasone (NASONEX) 50 MCG/ACT nasal spray PLACE TWO SPRAYS INTO THE NOSE DAILY 17 g 3   Multiple Vitamin (MULTIVITAMIN WITH MINERALS) TABS tablet Take 1 tablet by mouth daily.       omeprazole (PRILOSEC) 20 MG capsule Take 1 capsule (20 mg total) by mouth daily. 90 capsule 1   ondansetron (ZOFRAN) 4 MG tablet Take 1 tablet (4 mg total) by mouth daily as needed for nausea or vomiting. 30 tablet 0   prednisoLONE acetate (PRED FORTE) 1 % ophthalmic suspension Place 1 drop into both eyes daily at 2 PM. 5 mL 4   predniSONE (DELTASONE) 5 MG tablet TAKE ONE TABLET BY MOUTH ONCE A DAY WITH BREAKFAST. 60 tablet 1   No current facility-administered medications for this visit.    No Known Allergies  Family History  Problem Relation Age of Onset   Prostate cancer Neg Hx    Kidney cancer Neg Hx    Bladder Cancer Neg Hx    Cancer - Prostate Neg Hx     Social History   Socioeconomic History   Marital status: Widowed    Spouse name: Talbert Forest   Number of children: 2   Years of education: 6th grade   Highest education level: 6th grade  Occupational History   Not on file  Tobacco Use   Smoking status: Former    Current packs/day: 0.00    Types: Cigarettes    Quit date: 2010  Years since quitting: 14.5   Smokeless tobacco: Former    Types: Chew   Tobacco comments:    Stopped approximately 10 years ago.  Vaping Use   Vaping status: Never Used  Substance and Sexual Activity   Alcohol use: Yes    Alcohol/week: 2.0 standard drinks of alcohol    Types: 2 Cans of beer per week    Comment: occasionally   Drug use: Never   Sexual activity: Not Currently    Birth control/protection: None  Other Topics Concern   Not on file  Social History Narrative    lives in [place]; with oldest son. Wife died in 2019/01/16.  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 Pt still drives and is indepent with no serious chronic disease. Worked in [employer]    Social Determinants of Health   Financial Resource Strain: Low Risk  (10/20/2022)   Overall Financial Resource Strain (CARDIA)    Difficulty of Paying Living Expenses: Not  very hard  Food Insecurity: No Food Insecurity (10/17/2022)   Hunger Vital Sign    Worried About Running Out of Food in the Last Year: Never true    Ran Out of Food in the Last Year: Never true  Transportation Needs: No Transportation Needs (10/20/2022)   PRAPARE - Administrator, Civil Service (Medical): No    Lack of Transportation (Non-Medical): No  Physical Activity: Insufficiently Active (05/27/2021)   Exercise Vital Sign    Days of Exercise per Week: 5 days    Minutes of Exercise per Session: 10 min  Stress: No Stress Concern Present (05/27/2021)   [employer] of Occupational Health - Occupational Stress Questionnaire    Feeling of Stress : Only a little  Social Connections: Unknown (05/27/2021)   Social Connection and Isolation Panel [NHANES]    Frequency of Communication with Friends and Family: More than three times a week    Frequency of Social Gatherings with Friends and Family: Three times a week    Attends Religious Services: Not on file    Active Member of Clubs or Organizations: Not on file    Attends Club or Organization Meetings: Not on file    Marital Status: Widowed  Intimate Partner Violence: Not At Risk (10/17/2022)   Humiliation, Afraid, Rape, and Kick questionnaire    Fear of Current or Ex-Partner: No    Emotionally Abused: No    Physically Abused: No    Sexually Abused: No     Constitutional: Denies fever, malaise, fatigue, headache or abrupt weight changes.  HEENT: Denies eye pain, eye redness, ear pain, ringing in the ears, wax buildup, runny nose, nasal congestion, bloody nose, or sore throat. Respiratory: Denies difficulty breathing, shortness of breath, cough or sputum production.   Cardiovascular: Denies chest pain, chest tightness, palpitations or swelling in the hands or feet.  Gastrointestinal: Patient reports difficulty swallowing.  Denies abdominal pain, bloating, constipation, diarrhea or blood in the stool.  GU: Denies urgency,  frequency, pain with urination, burning sensation, blood in urine, odor or discharge. Musculoskeletal: Denies decrease in range of motion, difficulty with gait, muscle pain or joint pain and swelling.  Skin: Patient reports dog scratch to right forearm.  Denies ulcercations.  Neurological: Denies dizziness, difficulty with memory, difficulty with speech or problems with balance and coordination.  Psych: Patient has a history of anxiety depression.  Denies SI/HI.  No other specific complaints in a complete review of systems (except as  listed in HPI above).  Objective:   Physical Exam   BP 130/62   Pulse 80   Ht 5\' 6"  (1.676 m)   Wt 131 lb (59.4 kg)   SpO2 94%   BMI 21.14 kg/m   Wt Readings from Last 3 Encounters:  11/30/22 134 lb (60.8 kg)  11/28/22 134 lb 6.4 oz (61 kg)  11/09/22 138 lb (62.6 kg)    General: Appears his stated age, in NAD. Skin: Multiple skin tears noted to right forearm.   HEENT: Head: normal shape and size; Eyes: sclera white, no icterus, conjunctiva pink, PERRLA and EOMs intact;  Cardiovascular: Normal rate and rhythm.  Pulmonary/Chest: Normal effort and positive vesicular breath sounds. No respiratory distress. No wheezes, rales or ronchi noted.  Abdomen: Soft and nontender. Normal bowel sounds. No distention or masses noted.  Musculoskeletal:  No difficulty with gait.  Neurological: Alert and oriented.   BMET    Component Value Date/Time   NA 137 11/28/2022 1320   K 4.4 11/28/2022 1320   CL 104 11/28/2022 1320   CO2 25 11/28/2022 1320   GLUCOSE 102 (H) 11/28/2022 1320   BUN 48 (H) 11/28/2022 1320   CREATININE 2.81 (H) 11/28/2022 1320   CALCIUM 9.6 11/28/2022 1320   GFRNONAA 21 (L) 11/28/2022 1320   GFRAA 29 (L) 03/04/2020 0801    Lipid Panel     Component Value Date/Time   CHOL 212 (H) 11/30/2022 1435   TRIG 157 (H) 11/30/2022 1435   HDL 60 11/30/2022 1435   CHOLHDL 3.5 11/30/2022 1435   LDLCALC 125 (H) 11/30/2022 1435    CBC     Component Value Date/Time   WBC 12.7 (H) 11/28/2022 1320   WBC 8.5 11/05/2022 0940   RBC 3.41 (L) 11/28/2022 1320   HGB 11.0 (L) 11/28/2022 1320   HCT 32.9 (L) 11/28/2022 1320   PLT 209 11/28/2022 1320   MCV 96.5 11/28/2022 1320   MCH 32.3 11/28/2022 1320   MCHC 33.4 11/28/2022 1320   RDW 13.0 11/28/2022 1320   LYMPHSABS 1.9 11/28/2022 1320   MONOABS 0.6 11/28/2022 1320   EOSABS 0.5 11/28/2022 1320   BASOSABS 0.1 11/28/2022 1320    Hgb A1C Lab Results  Component Value Date   HGBA1C 4.5 (L) 04/13/2018           Assessment & Plan:   Multiple skin tears of right arm secondary to dog scratch:  Wound cleansed with normal saline Applied Steri-Strips, Xeroform gauze, Telfa and wrapped with rolled gauze Advised him to change this daily Rx for Augmentin 875-125 mg twice daily x 10 days Tdap given  RTC in 5 months for your annual exam Nicki Reaper, NP

## 2023-01-01 NOTE — Telephone Encounter (Signed)
  Chief Complaint: Medication question Symptoms:  Frequency: today Pertinent Negatives: Patient denies  Disposition: [] ED /[] Urgent Care (no appt availability in office) / [] Appointment(In office/virtual)/ []  [facility]/ [] Home Care/ [] Refused Recommended Disposition /[] [facility]/ [x]  Follow-up with PCP Additional Notes: Call from pt's son Edward Cummings. Pt and son need clarification regarding Acid reflux medication. In chart form today's visit, provider references Metoprolol, not omeprazole.   Please review chart and advise pt and son dosage for Omeprazole.  Summary: medication clarity for patient   Patient's son Edward Cummings has called in regards to PCP has changed his acid reflux medication to 40MG  and Edward Cummings states patient wants to know if he needs to stop taking all the other acid reflux medications too, please advise.  Edward Cummings) # [phone]     Author: Lorre Munroe, NP Author Type: Nurse Practitioner Filed: 01/01/2023  1:47 PM  Note Status: Written Cosign: Cosign Not Required Encounter Date: 01/01/2023  Problem: Gastroesophageal reflux disease without esophagitis  Editor: Lorre Munroe, NP (Nurse Practitioner)              New onset dysphagia Increase metoprolol to 40 mg daily, take to 20 mg tablets until you run out          Medication Changes   Amoxicillin-Pot Clavulanate 875-125 MG 1 tablet Oral 2 times daily  Omeprazole 40 mg Oral Daily   Reason for Disposition  [1] Caller has URGENT medicine question about med that PCP or specialist prescribed AND [2] triager unable to answer question  Answer Assessment - Initial Assessment Questions 1. NAME of MEDICINE: "What medicine(s) are you calling about?"     Omeprazole 2. QUESTION: "What is your question?" (e.g., double dose of medicine, side effect)     What should pt be taking  for acid reflux. 3. PRESCRIBER: "Who prescribed the medicine?" Reason: if prescribed by specialist, call should  be referred to that group.     [employer] 4. SYMPTOMS: "Do you have any symptoms?" If Yes, ask: "What symptoms are you having?"  "How bad are the symptoms (e.g., mild, moderate, severe)     no  Protocols used: Medication Question Call-A-AH

## 2023-01-01 NOTE — Addendum Note (Signed)
Addended by: Judd Gaudier on: 01/01/2023 02:50 PM   Modules accepted: Orders

## 2023-01-01 NOTE — Patient Instructions (Signed)
Dysphagia Eating Plan, Minced and Moist Foods This eating plan is for people who cannot chew foods into smaller pieces safe enough for swallowing. It is also for people who have pain or fatigue while chewing. Food items on this diet are soft, small, and moist. Work with your health care provider, your diet and nutrition specialist (dietitian), or speech-language pathologist to make sure that you are following the eating plan safely and getting all the nutrients you need. What are tips for following this plan? Cooking You may need to use a blender, whisk, or masher to soften some of your foods. To moisten foods, you may add liquids while you are blending, mashing, or grinding your foods to the right consistency. These liquids include gravies, sauces, vegetable or fruit juice, milk, half and half, or water. Reheat foods slowly to prevent a tough crust from forming. Meal planning Eat a variety of foods in order to get all the nutrients you need. Follow your meal plan as told by your health care provider or dietitian. General information Eat foods that are soft and moist. Avoid foods that are dry, hard, sticky, chewy, coarse, or crunchy. Avoid foods that separate into thin liquids and solids, such as cereal with milk or chunky soups. If you eat cold cereal, let it soften in milk and then drain excess milk before eating. Avoid liquids that have seeds or chunks. If you were on a pureed food eating plan, you may still eat any of the foods included in that diet. Always test food texture before taking a bite. Poke food with a fork or spoon to make sure it is tender. Talk with your health care provider about specific ways to test the size, texture, and safety of foods. Take small bites. Each bite should be smaller than the fingernail of your little finger (about 4 mm by 15 mm or smaller for adults and about 2 mm by 8 mm or smaller for children). If instructed by your health care provider, thicken liquids.  Follow your health care provider's instructions about what products to use, how to do this, and to what thickness. What foods should I eat?     Fruits Canned or cooked fruits that are soft or moist and do not have skin or seeds. Fresh, soft bananas. Vegetables Very soft, well-cooked vegetables in very small pieces. Soft-cooked, mashed potatoes. Grains Soaked soft breads without nuts or seeds. Pancakes, sweet rolls, pastries, and [place] toast that have been moistened with syrup or sauce. Well-cooked pasta, noodles, rice, and bread dressing in very small pieces and thick sauce. Soft dumplings or spaetzle in very small pieces and butter or gravy. Soft-cooked cereals. Cold cereal that has been softened in milk and the excess milk drained. Meats and other proteins Tender, moist, and finely minced or ground meats or poultry. Moist meatballs or meatloaf. Fish without bones. Scrambled, poached, or soft-cooked eggs. Tofu. Tempeh and meat alternatives in very small pieces. Well-cooked, moistened and mashed beans, baked beans, peas, and other legumes. Dairy Milk. Cream cheese. Yogurt. Cottage cheese. Sour cream. Fats and oils Butter. Margarine. Cream for cereal, depending on liquid consistency allowed. Gravy. Cream sauces. Mayonnaise. Sweets and desserts Pudding. Custard. Ice cream and sherbet. Whipped toppings. Soft, moist cakes. Icing. Jelly. Jams and preserves without seeds. Seasonings and other foods Sauces and salsas. Salad dressings. Casseroles with small pieces of tender meat. All seasonings and sweeteners. Beverages Anything prepared at the thickness recommended by your speech-language pathologist. The items listed above may not be a  complete list of foods and beverages you can eat. Contact a dietitian for more information. What foods should I avoid? Fruits Hard, crunchy, stringy, high-pulp, and juicy raw fruits such as apples, pineapple, papaya, and watermelon. Fruits with skins and seeds,  such as grapes. Dried fruit and fruit leather. Vegetables All raw vegetables. Tough, fibrous, chewy, or stringy cooked vegetables, such as celery, peas, broccoli, cabbage, Brussels sprouts, and asparagus. Potato skins. Potato and other vegetable chips. Fried or French-fried potatoes. Cooked corn and peas. Grains Breads that are hard or have nuts or seeds. Dry biscuits, pancakes, waffles, and bread dressing. Coarse cereals. Cereals that have nuts, seeds, dried fruits, or coconut. Sticky rice. Large pieces of pasta. Meats and other proteins Large pieces of meat. Dry, tough meats, such as bacon, sausage, and hot dogs. Chicken, [place], or fish with skin and bones. Crunchy peanut butter. Nuts. Seeds. Dairy Yogurt with nuts, seeds, or large chunks. Large chunks of cheese. Sweets and desserts Coarse, hard, chewy, or sticky desserts. Any dessert with nuts, seeds, coconut, pineapple, or dried fruit. Bread pudding. Ask your health care provider whether you can have frozen desserts. Seasonings and other foods Soups and casseroles with large chunks. Sandwiches. Pizza. The items listed above may not be a complete list of foods and beverages you should avoid. Contact a dietitian for more information. Summary Moist and minced foods can be helpful for people with swallowing and chewing problems. On this dysphagia eating plan, you may eat foods that are soft, moist, and cut into pieces about 4 mm by 15 mm or smaller for adults and about 2 mm by 8 mm or smaller for children. You may be instructed to thicken liquids. Follow your health care provider's instructions about how to do this and to what consistency. This information is not intended to replace advice given to you by your health care provider. Make sure you discuss any questions you have with your health care provider. Document Revised: 07/21/2021 Document Reviewed: 07/21/2021 Elsevier Patient Education  2024 [employer].

## 2023-01-02 ENCOUNTER — Other Ambulatory Visit: Payer: Self-pay

## 2023-01-02 ENCOUNTER — Emergency Department
Admission: EM | Admit: 2023-01-02 | Discharge: 2023-01-02 | Discharge status: Against Medical Advice | Payer: Medicare HMO | Attending: Emergency Medicine | Admitting: Emergency Medicine

## 2023-01-02 DIAGNOSIS — Z8551 Personal history of malignant neoplasm of bladder: Secondary | ICD-10-CM | POA: Insufficient documentation

## 2023-01-02 DIAGNOSIS — R739 Hyperglycemia, unspecified: Secondary | ICD-10-CM | POA: Diagnosis not present

## 2023-01-02 DIAGNOSIS — I959 Hypotension, unspecified: Secondary | ICD-10-CM | POA: Diagnosis not present

## 2023-01-02 DIAGNOSIS — R531 Weakness: Secondary | ICD-10-CM | POA: Diagnosis not present

## 2023-01-02 DIAGNOSIS — Z743 Need for continuous supervision: Secondary | ICD-10-CM | POA: Diagnosis not present

## 2023-01-02 DIAGNOSIS — Z5321 Procedure and treatment not carried out due to patient leaving prior to being seen by health care provider: Secondary | ICD-10-CM | POA: Diagnosis not present

## 2023-01-02 LAB — CBC
HCT: 32.8 % — ABNORMAL LOW (ref 39.0–52.0)
Hemoglobin: 10.9 g/dL — ABNORMAL LOW (ref 13.0–17.0)
MCH: 32.8 pg (ref 26.0–34.0)
MCHC: 33.2 g/dL (ref 30.0–36.0)
MCV: 98.8 fL (ref 80.0–100.0)
Platelets: 215 10*3/uL (ref 150–400)
RBC: 3.32 MIL/uL — ABNORMAL LOW (ref 4.22–5.81)
RDW: 12.9 % (ref 11.5–15.5)
WBC: 12.7 10*3/uL — ABNORMAL HIGH (ref 4.0–10.5)
nRBC: 0 % (ref 0.0–0.2)

## 2023-01-02 LAB — COMPREHENSIVE METABOLIC PANEL
ALT: 14 U/L (ref 0–44)
AST: 17 U/L (ref 15–41)
Albumin: 3.9 g/dL (ref 3.5–5.0)
Alkaline Phosphatase: 48 U/L (ref 38–126)
Anion gap: 8 (ref 5–15)
BUN: 42 mg/dL — ABNORMAL HIGH (ref 8–23)
CO2: 22 mmol/L (ref 22–32)
Calcium: 9 mg/dL (ref 8.9–10.3)
Chloride: 105 mmol/L (ref 98–111)
Creatinine, Ser: 2.9 mg/dL — ABNORMAL HIGH (ref 0.61–1.24)
GFR, Estimated: 20 mL/min — ABNORMAL LOW (ref >60)
Glucose, Bld: 173 mg/dL — ABNORMAL HIGH (ref 70–99)
Potassium: 4.7 mmol/L (ref 3.5–5.1)
Sodium: 135 mmol/L (ref 135–145)
Total Bilirubin: 0.7 mg/dL (ref 0.3–1.2)
Total Protein: 6.7 g/dL (ref 6.5–8.1)

## 2023-01-02 NOTE — ED Triage Notes (Signed)
EMS brings pt in from home; son took pt to fire station today because he wasn't active himself; hx bladder CA; pt denies c/o

## 2023-01-02 NOTE — ED Triage Notes (Signed)
Pt presents with family reporting weakness. States went to the fire department today and was told his CBG was 267 and bp 118/55. Pt does not have hx of DM. Pt alert and oriented following commands. Denies h/a, dizziness, vision changes. Denies parasthesia. Pt breathing unlabored speaking in full sentences. Denies recent falls.

## 2023-01-02 NOTE — Telephone Encounter (Signed)
Note addended.  He was to increase the omeprazole to 40 mg daily.  I sent in a new prescription for 40 mg daily.  He can take 2 20 mg tablets until he runs out of his current prescription

## 2023-01-03 ENCOUNTER — Encounter: Payer: Self-pay | Admitting: Internal Medicine

## 2023-01-03 ENCOUNTER — Ambulatory Visit: Payer: Self-pay

## 2023-01-03 ENCOUNTER — Ambulatory Visit: Payer: Medicare HMO | Admitting: Internal Medicine

## 2023-01-03 VITALS — BP 120/54 | Ht 66.0 in | Wt 130.0 lb

## 2023-01-03 DIAGNOSIS — R5383 Other fatigue: Secondary | ICD-10-CM

## 2023-01-03 DIAGNOSIS — T148XXA Other injury of unspecified body region, initial encounter: Secondary | ICD-10-CM

## 2023-01-03 DIAGNOSIS — R531 Weakness: Secondary | ICD-10-CM | POA: Diagnosis not present

## 2023-01-03 DIAGNOSIS — R7309 Other abnormal glucose: Secondary | ICD-10-CM

## 2023-01-03 LAB — POCT GLYCOSYLATED HEMOGLOBIN (HGB A1C): HbA1c, POC (prediabetic range): 5.7 % (ref 5.7–6.4)

## 2023-01-03 NOTE — Progress Notes (Signed)
Subjective:    Patient ID: Edward Cummings, male    DOB: 06-11-1933, 89 y.o.   MRN: [id number]  HPI  Patient presents to clinic today with complaint of fatigue and weakness.  His son noticed this yesterday.  He did go to the ER for evaluation but left without being seen.  CBC showed a stable anemia and consistently elevated white count.  Metabolic panel showed slightly worsened CKD.  His son reports he is better today.  He was seen 7/23 with complaints of dysphagia, GERD and a dog bite to his right arm.  His omeprazole was increased to 40 mg daily, he was given a Tdap and started on Augmentin 875-125 mg daily.  He was referred to GI for further evaluation.  He has a new patient appoint with nephrology on 8/8.  He has a history of hypothyroidism, managed on levothyroxine.  TSH from 11/2022 was normal.  Review of Systems     Past Medical History:  Diagnosis Date   Anemia    Anxiety 12/31/2019   Cancer [facility])    bladder   Chronic kidney disease    Depression    Hypertension    Neuromuscular disorder (HCC)    Nerve damage to left face/eye since around 2002.    Current Outpatient Medications  Medication Sig Dispense Refill   amLODipine (NORVASC) 10 MG tablet TAKE ONE TABLET BY MOUTH ONCE DAILY 90 tablet 1   amoxicillin-clavulanate (AUGMENTIN) 875-125 MG tablet Take 1 tablet by mouth 2 (two) times daily. 20 tablet 0   aspirin EC 81 MG tablet Take 81 mg by mouth daily.     atorvastatin (LIPITOR) 10 MG tablet Take 1 tablet (10 mg total) by mouth daily. 90 tablet 1   dextromethorphan-guaiFENesin (MUCINEX DM) 30-600 MG 12hr tablet Take 1 tablet by mouth 2 (two) times daily as needed for cough. 30 tablet 0   Ipratropium-Albuterol (COMBIVENT RESPIMAT) 20-100 MCG/ACT AERS respimat Inhale 1 puff into the lungs every 6 (six) hours as needed for wheezing or shortness of breath. 4 g 0   levothyroxine (SYNTHROID) 75 MCG tablet TAKE ONE TABLET BY MOUTH EVERY MORNING BEFORE BREAKFAST 30 tablet 3    loratadine (CLARITIN) 10 MG tablet TAKE 1 TABLET BY MOUTH ONCE A DAY 30 tablet 11   mometasone (NASONEX) 50 MCG/ACT nasal spray PLACE TWO SPRAYS INTO THE NOSE DAILY 17 g 3   Multiple Vitamin (MULTIVITAMIN WITH MINERALS) TABS tablet Take 1 tablet by mouth daily.      omeprazole (PRILOSEC) 40 MG capsule Take 1 capsule (40 mg total) by mouth daily. 90 capsule 1   ondansetron (ZOFRAN) 4 MG tablet Take 1 tablet (4 mg total) by mouth daily as needed for nausea or vomiting. 30 tablet 0   prednisoLONE acetate (PRED FORTE) 1 % ophthalmic suspension Place 1 drop into both eyes daily at 2 PM. 5 mL 4   predniSONE (DELTASONE) 5 MG tablet TAKE ONE TABLET BY MOUTH ONCE A DAY WITH BREAKFAST. 60 tablet 1   No current facility-administered medications for this visit.    No Known Allergies  Family History  Problem Relation Age of Onset   Prostate cancer Neg Hx    Kidney cancer Neg Hx    Bladder Cancer Neg Hx    Cancer - Prostate Neg Hx     Social History   Socioeconomic History   Marital status: Widowed    Spouse name: Talbert Forest   Number of children: 2   Years of education: 6th grade  Highest education level: 6th grade  Occupational History   Not on file  Tobacco Use   Smoking status: Former    Current packs/day: 0.00    Types: Cigarettes    Quit date: 01-16-09    Years since quitting: 14.5   Smokeless tobacco: Former    Types: Chew   Tobacco comments:    Stopped approximately 10 years ago.  Vaping Use   Vaping status: Never Used  Substance and Sexual Activity   Alcohol use: Yes    Alcohol/week: 2.0 standard drinks of alcohol    Types: 2 Cans of beer per week    Comment: occasionally   Drug use: Never   Sexual activity: Not Currently    Birth control/protection: None  Other Topics Concern   Not on file  Social History Narrative    lives in [place]; with oldest son. Wife died in 01/17/19.  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 Pt  still drives and is indepent with no serious chronic disease. Worked in [employer]    Social Determinants of Health   Financial Resource Strain: Low Risk  (10/20/2022)   Overall Financial Resource Strain (CARDIA)    Difficulty of Paying Living Expenses: Not very hard  Food Insecurity: No Food Insecurity (10/17/2022)   Hunger Vital Sign    Worried About Running Out of Food in the Last Year: Never true    Ran Out of Food in the Last Year: Never true  Transportation Needs: No Transportation Needs (10/20/2022)   PRAPARE - Administrator, Civil Service (Medical): No    Lack of Transportation (Non-Medical): No  Physical Activity: Insufficiently Active (05/27/2021)   Exercise Vital Sign    Days of Exercise per Week: 5 days    Minutes of Exercise per Session: 10 min  Stress: No Stress Concern Present (05/27/2021)   [employer] of Occupational Health - Occupational Stress Questionnaire    Feeling of Stress : Only a little  Social Connections: Unknown (05/27/2021)   Social Connection and Isolation Panel [NHANES]    Frequency of Communication with Friends and Family: More than three times a week    Frequency of Social Gatherings with Friends and Family: Three times a week    Attends Religious Services: Not on file    Active Member of Clubs or Organizations: Not on file    Attends Club or Organization Meetings: Not on file    Marital Status: Widowed  Intimate Partner Violence: Not At Risk (10/17/2022)   Humiliation, Afraid, Rape, and Kick questionnaire    Fear of Current or Ex-Partner: No    Emotionally Abused: No    Physically Abused: No    Sexually Abused: No     Constitutional: Patient reports fatigue.  Denies fever, malaise, headache or abrupt weight changes.  HEENT: Denies eye pain, eye redness, ear pain, ringing in the ears, wax buildup, runny nose, nasal congestion, bloody nose, or sore throat. Respiratory: Denies difficulty breathing, shortness of breath, cough or  sputum production.   Cardiovascular: Denies chest pain, chest tightness, palpitations or swelling in the hands or feet.  Gastrointestinal: Patient reports dysphagia.  Denies abdominal pain, bloating, constipation, diarrhea or blood in the stool.  GU: Denies urgency, frequency, pain with urination, burning sensation, blood in urine, odor or discharge. Musculoskeletal: Patient reports generalized weakness.  Denies decrease in range of motion, difficulty with gait, muscle pain or joint pain and swelling.  Skin: Patient reports multiple  skin tears to right arm.  Denies redness, rashes, lesions or ulcercations.  Neurological: Denies dizziness, difficulty with memory, difficulty with speech or problems with balance and coordination.  Psych: Patient is a history of anxiety and depression.  Denies SI/HI.  No other specific complaints in a complete review of systems (except as listed in HPI above).  Objective:   Physical Exam   BP (!) 120/54   Ht 5\' 6"  (1.676 m)   Wt 130 lb (59 kg)   BMI 20.98 kg/m   Wt Readings from Last 3 Encounters:  01/01/23 131 lb (59.4 kg)  11/30/22 134 lb (60.8 kg)  11/28/22 134 lb 6.4 oz (61 kg)    General: Appears his stated age, chronically ill appearing, in NAD. Skin: Multiple skin tears to right forearm, wound redressed HEENT: Head: normal shape and size; Eyes: sclera white, no icterus, conjunctiva pink, PERRLA and EOMs intact;  Neck:  Neck supple, trachea midline. No masses, lumps or thyromegaly present.  Cardiovascular: Normal rate and rhythm. S1,S2 noted.  No murmur, rubs or gallops noted. No JVD or BLE edema. Pulmonary/Chest: Normal effort and positive vesicular breath sounds. No respiratory distress. No wheezes, rales or ronchi noted.  Abdomen: Soft and nontender. Normal bowel sounds.  Musculoskeletal: Gait slow and steady without device. Neurological: Alert and oriented.  Left side facial droop noted, chronic coordination normal.    BMET    Component  Value Date/Time   NA 135 01/02/2023 2014   K 4.7 01/02/2023 2014   CL 105 01/02/2023 2014   CO2 22 01/02/2023 2014   GLUCOSE 173 (H) 01/02/2023 2014   BUN 42 (H) 01/02/2023 2014   CREATININE 2.90 (H) 01/02/2023 2014   CREATININE 2.81 (H) 11/28/2022 1320   CALCIUM 9.0 01/02/2023 2014   GFRNONAA 20 (L) 01/02/2023 2014   GFRNONAA 21 (L) 11/28/2022 1320   GFRAA 29 (L) 03/04/2020 0801    Lipid Panel     Component Value Date/Time   CHOL 212 (H) 11/30/2022 1435   TRIG 157 (H) 11/30/2022 1435   HDL 60 11/30/2022 1435   CHOLHDL 3.5 11/30/2022 1435   LDLCALC 125 (H) 11/30/2022 1435    CBC    Component Value Date/Time   WBC 12.7 (H) 01/02/2023 2014   RBC 3.32 (L) 01/02/2023 2014   HGB 10.9 (L) 01/02/2023 2014   HGB 11.0 (L) 11/28/2022 1320   HCT 32.8 (L) 01/02/2023 2014   PLT 215 01/02/2023 2014   PLT 209 11/28/2022 1320   MCV 98.8 01/02/2023 2014   MCH 32.8 01/02/2023 2014   MCHC 33.2 01/02/2023 2014   RDW 12.9 01/02/2023 2014   LYMPHSABS 1.9 11/28/2022 1320   MONOABS 0.6 11/28/2022 1320   EOSABS 0.5 11/28/2022 1320   BASOSABS 0.1 11/28/2022 1320    Hgb A1C Lab Results  Component Value Date   HGBA1C 4.5 (L) 04/13/2018           Assessment & Plan:   Fatigue, weakness, multiple skin tears of right forearm:  Poct A1C 5.7 Skin tears to right forearm do not appear to be infected He is on prophylactic antibiotic, advised him to continue this Discussed that he could be feeling fatigue and weak due to Tdap that was given 2 days ago as well as the strong antibiotic that he is on Encouraged him to eat routinely Encouraged him to drink adequate amounts of water Monitor for new or worsening symptoms   RTC in 5 months for annual exam Nicki Reaper, NP

## 2023-01-03 NOTE — Patient Instructions (Signed)
Weakness Weakness is a lack of strength. You may feel weak all over your body (generalized), or you may feel weak in one part of your body (focal). There are many potential causes of weakness. Sometimes, the cause of your weakness may not be known. Some causes of weakness can be serious, so it is important to see your doctor. Follow these instructions at home: Activity Rest as needed. Try to get enough sleep. Most adults need 7-8 hours of sleep each night. Talk to your doctor about how much sleep you need. Do exercises, such as arm curls and leg raises, for 30 minutes at least 2 days a week or as told by your doctor. Think about working with a physical therapist or trainer to help you get stronger. General instructions  Take over-the-counter and prescription medicines only as told by your doctor. Eat a healthy, well-balanced diet. This includes: Proteins to build muscles, such as lean meats and fish. Fresh fruits and vegetables. Carbohydrates to boost energy, such as whole grains. Drink enough fluid to keep your pee (urine) pale yellow. Keep all follow-up visits. Contact a doctor if: Your weakness does not get better or it gets worse. Your weakness affects your ability to: Think clearly. Do your normal daily activities. Get help right away if: You have sudden weakness on one side of your face or body. You have chest pain. You have trouble breathing or shortness of breath. You have problems with how you see (vision). You have trouble talking or swallowing. You have trouble standing or walking. You are light-headed or faint. These symptoms may be an emergency. Get help right away. Call 911. Do not wait to see if the symptoms will go away. Do not drive yourself to the hospital. Summary Weakness is a lack of strength. You may feel weak all over your body or just in one part of your body. There are many potential causes of weakness. Sometimes, the cause of your weakness may not be  known. Rest as needed, and try to get enough sleep. Most adults need 7-8 hours of sleep each night. Eat a healthy, well-balanced diet. This information is not intended to replace advice given to you by your health care provider. Make sure you discuss any questions you have with your health care provider. Document Revised: 05/01/2021 Document Reviewed: 05/01/2021 Elsevier Patient Education  2024 [employer].

## 2023-01-03 NOTE — Telephone Encounter (Signed)
     Chief Complaint: Increased weakness per son. Went to ED last night and left before being seen, labs done. Symptoms: Above Frequency: This week Pertinent Negatives: Patient denies chest pain Disposition: [] ED /[] Urgent Care (no appt availability in office) / [x] Appointment(In office/virtual)/ []  [place] [facility]/ [] Home Care/ [] Refused Recommended Disposition /[]  Mobile Bus/ []  Follow-up with PCP Additional Notes: Son agrees with appointment.  Reason for Disposition  [1] MODERATE weakness (i.e., interferes with work, school, normal activities) AND [2] cause unknown  (Exceptions: Weakness from acute minor illness or poor fluid intake; weakness is chronic and not worse.)  Answer Assessment - Initial Assessment Questions 1. DESCRIPTION: "Describe how you are feeling."     Weakness 2. SEVERITY: "How bad is it?"  "Can you stand and walk?"   - MILD (0-3): Feels weak or tired, but does not interfere with work, school or normal activities.   - MODERATE (4-7): Able to stand and walk; weakness interferes with work, school, or normal activities.   - SEVERE (8-10): Unable to stand or walk; unable to do usual activities.     Moderate 3. ONSET: "When did these symptoms begin?" (e.g., hours, days, weeks, months)     Weaker this weak 4. CAUSE: "What do you think is causing the weakness or fatigue?" (e.g., not drinking enough fluids, medical problem, trouble sleeping)     Unsure 5. NEW MEDICINES:  "Have you started on any new medicines recently?" (e.g., opioid pain medicines, benzodiazepines, muscle relaxants, antidepressants, antihistamines, neuroleptics, beta blockers)     Yes 6. OTHER SYMPTOMS: "Do you have any other symptoms?" (e.g., chest pain, fever, cough, SOB, vomiting, diarrhea, bleeding, other areas of pain)     No 7. PREGNANCY: "Is there any chance you are pregnant?" "When was your last menstrual period?"     N/a  Protocols used: Weakness (Generalized) and  Fatigue-A-AH

## 2023-01-09 ENCOUNTER — Telehealth: Payer: Self-pay | Admitting: *Deleted

## 2023-01-09 NOTE — Transitions of Care (Post Inpatient/ED Visit) (Signed)
01/09/2023  Name: Edward Cummings MRN: [id number] DOB: Jan 01, 1933  Today's TOC FU Call Status: Today's TOC FU Call Status:: Successful TOC FU Call Competed TOC FU Call Complete Date: 01/09/23  Transition Care Management Follow-up Telephone Call Date of Discharge: 01/03/23 Discharge Facility: [facility]) Type of Discharge: Emergency Department Reason for ED Visit: Other: (weakness and fatigue. Patient left AMA) How have you been since you were released from the hospital?: Better Any questions or concerns?: No  Items Reviewed: Did you receive and understand the discharge instructions provided?: No (left AMA) Medications obtained,verified, and reconciled?: Yes (Medications Reviewed) (no new meds given in ED/ Lt AMA) Any new allergies since your discharge?: No Dietary orders reviewed?: No Do you have support at home?: Yes People in Home: child(ren), adult Name of Support/Comfort Primary Source: Iantha Fallen  Medications Reviewed Today: Medications Reviewed Today     Reviewed by Luella Cook, RN (Case Manager) on 01/09/23 at 1102  Med List Status: <None>   Medication Order Taking? Sig Documenting Provider Last Dose Status Informant  amLODipine (NORVASC) 10 MG tablet [id number] Yes TAKE ONE TABLET BY MOUTH ONCE DAILY Kara Dies, NP Taking Active Pharmacy Records, Multiple Informants, Other  amoxicillin-clavulanate (AUGMENTIN) 875-125 MG tablet [id number]  Take 1 tablet by mouth 2 (two) times daily. Lorre Munroe, NP  Active   aspirin EC 81 MG tablet [id number] Yes Take 81 mg by mouth daily. [provider] Taking Active Family Member, Pharmacy Records, Multiple Informants, Other           Med Note Yetta Barre, HEATHER R   Fri Jan 10, 2019  9:07 AM)    atorvastatin (LIPITOR) 10 MG tablet [id number] Yes Take 1 tablet (10 mg total) by mouth daily. Lorre Munroe, NP Taking Active   dextromethorphan-guaiFENesin [facility] DM) 30-600 MG 12hr tablet  [id number] Yes Take 1 tablet by mouth 2 (two) times daily as needed for cough. Drema Dallas, MD Taking Active   Ipratropium-Albuterol (COMBIVENT RESPIMAT) 20-100 MCG/ACT AERS respimat [id number] Yes Inhale 1 puff into the lungs every 6 (six) hours as needed for wheezing or shortness of breath. Drema Dallas, MD Taking Active   levothyroxine (SYNTHROID) 75 MCG tablet [id number] Yes TAKE ONE TABLET BY MOUTH EVERY MORNING BEFORE BREAKFAST Earna Coder, MD Taking Active   loratadine (CLARITIN) 10 MG tablet [id number] Yes TAKE 1 TABLET BY MOUTH ONCE A Keniyah Gelinas Nickels, MD Taking Active Pharmacy Records, Multiple Informants, Other  mometasone (NASONEX) 50 MCG/ACT nasal spray [id number] Yes PLACE TWO SPRAYS INTO THE NOSE DAILY Lorre Munroe, NP Taking Active   Multiple Vitamin (MULTIVITAMIN WITH MINERALS) TABS tablet [id number] Yes Take 1 tablet by mouth daily.  [provider] Taking Active Family Member, Pharmacy Records, Multiple Informants, Other  omeprazole (PRILOSEC) 40 MG capsule [id number] Yes Take 1 capsule (40 mg total) by mouth daily. Lorre Munroe, NP Taking Active   ondansetron [facility]) 4 MG tablet [id number] Yes Take 1 tablet (4 mg total) by mouth daily as needed for nausea or vomiting. Drema Dallas, MD Taking Active   prednisoLONE acetate (PRED FORTE) 1 % ophthalmic suspension [id number] Yes Place 1 drop into both eyes daily at 2 PM. Corky Downs, MD Taking Active Pharmacy Records, Multiple Informants, Other  predniSONE (DELTASONE) 5 MG tablet [id number] Yes TAKE ONE TABLET BY MOUTH ONCE A DAY WITH BREAKFAST. Earna Coder, MD Taking Active             Home Care and  Equipment/Supplies: Were Home Health Services Ordered?: NA Any new equipment or medical supplies ordered?: NA  Functional Questionnaire: Do you need assistance with bathing/showering or dressing?: No Do you need assistance with meal preparation?: No Do you need assistance with eating?: No Do  you have difficulty maintaining continence: No  Follow up appointments reviewed: PCP Follow-up appointment confirmed?: Yes Date of PCP follow-up appointment?: 01/03/23 Follow-up Provider: Nicki Reaper NP Specialist Hospital Follow-up appointment confirmed?: NA Do you need transportation to your follow-up appointment?: No Do you understand care options if your condition(s) worsen?: Yes-patient verbalized understanding  SDOH Interventions Today    Flowsheet Row Most Recent Value  SDOH Interventions   Food Insecurity Interventions Intervention Not Indicated  Housing Interventions Intervention Not Indicated  Transportation Interventions Intervention Not Indicated      Interventions Today    Flowsheet Row Most Recent Value  General Interventions   General Interventions Discussed/Reviewed General Interventions Discussed, General Interventions Reviewed, Doctor Visits  Nutrition Interventions   Nutrition Discussed/Reviewed Nutrition Discussed  Pharmacy Interventions   Pharmacy Dicussed/Reviewed Pharmacy Topics Discussed      TOC Interventions Today    Flowsheet Row Most Recent Value  TOC Interventions   TOC Interventions Discussed/Reviewed TOC Interventions Discussed, TOC Interventions Reviewed       Gean Maidens BSN RN [facility] [phone]

## 2023-01-11 ENCOUNTER — Other Ambulatory Visit: Payer: Self-pay | Admitting: Internal Medicine

## 2023-01-11 NOTE — Telephone Encounter (Signed)
Requested medication (s) are due for refill today:yes  Requested medication (s) are on the active medication list: yes  Last refill:  10/28/21 5 ml 4 RF  Future visit scheduled:yes  Notes to clinic:  med not assigned to a protocol   Requested Prescriptions  Pending Prescriptions Disp Refills   prednisoLONE acetate (PRED FORTE) 1 % ophthalmic suspension [Pharmacy Med Name: PREDNISOLONE ACETATE 1% OP SUSPENSION] 5 mL 1    Sig: PLACE ONE DROP INTO EACH EYE DAILY AT TWO PM.     Off-Protocol Failed - 01/11/2023  9:03 AM      Failed - Medication not assigned to a protocol, review manually.      Passed - Valid encounter within last 12 months    Recent Outpatient Visits           1 week ago Other fatigue   [place] [facility] [place], [place] W, NP   1 week ago Gastroesophageal reflux disease without esophagitis   [place] [facility] [place], Salvadore Oxford, NP   1 month ago Vitamin D deficiency   [place] [facility] [place], Salvadore Oxford, NP       Future Appointments             In 4 months Baity, Salvadore Oxford, NP [facility], [facility]

## 2023-01-12 ENCOUNTER — Other Ambulatory Visit: Payer: Self-pay | Admitting: Internal Medicine

## 2023-01-12 ENCOUNTER — Telehealth: Payer: Self-pay | Admitting: Internal Medicine

## 2023-01-12 NOTE — Telephone Encounter (Signed)
Medication Refill - Medication: prednisoLONE acetate (PRED FORTE) 1 % ophthalmic suspension   Has the patient contacted their pharmacy? Yes.   (Agent: If no, request that the patient contact the pharmacy for the refill. If patient does not wish to contact the pharmacy document the reason why and proceed with request.) (Agent: If yes, when and what did the pharmacy advise?)  Preferred Pharmacy (with phone number or street name):  [facility] - [place], [place] - [address] Phone: [phone]  Fax: [phone]     Has the patient been seen for an appointment in the last year OR does the patient have an upcoming appointment? Yes.    Agent: Please be advised that RX refills may take up to 3 business days. We ask that you follow-up with your pharmacy.  *Current prescribing provider listed was patient's previous pcp. Patient is now under the care of Nicki Reaper, NP. This is something patient uses every day. Please advise if this can be filled and let patient's son(micheal) know.

## 2023-01-12 NOTE — Telephone Encounter (Signed)
Pt son's is calling to check on the status of the medication, Please advise

## 2023-01-12 NOTE — Telephone Encounter (Signed)
Requested medication (s) are due for refill today -yes  Requested medication (s) are on the active medication list -yes  Future visit scheduled -yes  Last refill: 10/28/21  Notes to clinic: off protocol- provider review - request PCP review: Current prescribing provider listed was patient's previous pcp. Patient is now under the care of Nicki Reaper, NP. This is something patient uses every day. Please advise if this can be filled and let patient's son(micheal) know   Requested Prescriptions  Pending Prescriptions Disp Refills   prednisoLONE acetate (PRED FORTE) 1 % ophthalmic suspension 5 mL 4    Sig: Place 1 drop into both eyes daily at 2 PM.     Off-Protocol Failed - 01/12/2023 11:28 AM      Failed - Medication not assigned to a protocol, review manually.      Passed - Valid encounter within last 12 months    Recent Outpatient Visits           1 week ago Other fatigue   [place] [facility] [place], [place] W, NP   1 week ago Gastroesophageal reflux disease without esophagitis   [place] [facility] [place], Salvadore Oxford, NP   1 month ago Vitamin D deficiency   [place] [facility] [place], Salvadore Oxford, NP       Future Appointments             In 4 months Sh[place], Salvadore Oxford, NP [place] [facility], [facility]               Requested Prescriptions  Pending Prescriptions Disp Refills   prednisoLONE acetate (PRED FORTE) 1 % ophthalmic suspension 5 mL 4    Sig: Place 1 drop into both eyes daily at 2 PM.     Off-Protocol Failed - 01/12/2023 11:28 AM      Failed - Medication not assigned to a protocol, review manually.      Passed - Valid encounter within last 12 months    Recent Outpatient Visits           1 week ago Other fatigue   [place] [facility] [place], [place] W, NP   1 week ago Gastroesophageal reflux disease without esophagitis   [place] [facility] [place], Salvadore Oxford, NP   1 month ago Vitamin D deficiency   [place] [facility] [place], Salvadore Oxford, NP       Future Appointments             In 4 months Baity, Salvadore Oxford, NP [place] [facility], [facility]

## 2023-01-15 ENCOUNTER — Telehealth: Payer: Self-pay | Admitting: Internal Medicine

## 2023-01-15 ENCOUNTER — Other Ambulatory Visit: Payer: Self-pay | Admitting: Internal Medicine

## 2023-01-15 NOTE — Telephone Encounter (Signed)
[facility] called and spoke to [place], [facility] about the refill(s) prednisolone acetate eye drop requested. Advised it was last sent by a different provider and if the patient is requesting. He says he's not sure. Advised I will call the patient. Patient called and advised of the refill request by [facility] and asked if he's still seeing that provider for the eyes. He says no, but he had cornea surgery and was told he needs to take this eye drop for the rest of his life. He would like Regina to refill it. Advised I will send this request to her. Request sent in a separate refill encounter today.

## 2023-01-15 NOTE — Telephone Encounter (Signed)
Patient called and advised of the refill request by [facility] and asked if he's still seeing that provider for the eyes. He says no, but he had cornea surgery and was told he needs to take this eye drop for the rest of his life. He would like Regina to refill it. Advised I will send this request to her. Request sent in a separate refill encounter today.

## 2023-01-15 NOTE — Telephone Encounter (Signed)
Requested medication (s) are due for refill today: Yes  Requested medication (s) are on the active medication list: Yes  Last refill:  10/29/22  Future visit scheduled: Yes  Notes to clinic:  Manual review. Different prescriber.    Requested Prescriptions  Pending Prescriptions Disp Refills   prednisoLONE acetate (PRED FORTE) 1 % ophthalmic suspension [Pharmacy Med Name: PREDNISOLONE ACETATE 1% OP SUSPENSION] 5 mL 1    Sig: PLACE ONE DROP INTO EACH EYE DAILY AT TWO PM.     Off-Protocol Failed - 01/12/2023  2:53 PM      Failed - Medication not assigned to a protocol, review manually.      Passed - Valid encounter within last 12 months    Recent Outpatient Visits           1 week ago Other fatigue   [facility] [place], [place] W, NP   2 weeks ago Gastroesophageal reflux disease without esophagitis   [facility] [place], Salvadore Oxford, NP   1 month ago Vitamin D deficiency    [facility] [place], Salvadore Oxford, NP       Future Appointments             In 4 months Baity, Salvadore Oxford, NP  [facility], [facility]

## 2023-01-15 NOTE — Telephone Encounter (Signed)
Copied from CRM [id number]. Topic: General - Other >> Jan 15, 2023  1:00 PM Everette C wrote: Reason for CRM: Medication Refill - Medication: prednisoLONE acetate (PRED FORTE) 1 % ophthalmic suspension [[id number]] - the patient has ran out of their eye prescription and their pharmacy has bene unable to contact the office via fax   Has the patient contacted their pharmacy? Yes.   (Agent: If no, request that the patient contact the pharmacy for the refill. If patient does not wish to contact the pharmacy document the reason why and proceed with request.) (Agent: If yes, when and what did the pharmacy advise?)  Preferred Pharmacy (with phone number or street name): [facility] - [place], [place] - [address] [place] [address] Phone: [phone] Fax: [phone] Hours: Not open 24 hours   Has the patient been seen for an appointment in the last year OR does the patient have an upcoming appointment? Yes.    Agent: Please be advised that RX refills may take up to 3 business days. We ask that you follow-up with your pharmacy.

## 2023-01-15 NOTE — Telephone Encounter (Signed)
Requested medication (s) are due for refill today: Yes  Requested medication (s) are on the active medication list: Yes  Last refill:  10/28/21  Future visit scheduled: Yes  Notes to clinic:  Unable to refill per protocol, last refill by another provider. See notes from patient, would like Regina to start refilling     Requested Prescriptions  Pending Prescriptions Disp Refills   prednisoLONE acetate (PRED FORTE) 1 % ophthalmic suspension [Pharmacy Med Name: PREDNISOLONE ACETATE 1% OP SUSPENSION] 5 mL 1    Sig: PLACE ONE DROP INTO EACH EYE DAILY AT TWO PM.     Off-Protocol Failed - 01/15/2023  1:18 PM      Failed - Medication not assigned to a protocol, review manually.      Passed - Valid encounter within last 12 months    Recent Outpatient Visits           1 week ago Other fatigue   [facility] [place], [place] W, NP   2 weeks ago Gastroesophageal reflux disease without esophagitis   [facility] [place], Salvadore Oxford, NP   1 month ago Vitamin D deficiency   [facility] [place], Salvadore Oxford, NP       Future Appointments             In 4 months Baity, Salvadore Oxford, NP  [facility], [facility]

## 2023-01-18 DIAGNOSIS — D631 Anemia in chronic kidney disease: Secondary | ICD-10-CM | POA: Diagnosis not present

## 2023-01-18 DIAGNOSIS — I129 Hypertensive chronic kidney disease with stage 1 through stage 4 chronic kidney disease, or unspecified chronic kidney disease: Secondary | ICD-10-CM | POA: Diagnosis not present

## 2023-01-18 DIAGNOSIS — N184 Chronic kidney disease, stage 4 (severe): Secondary | ICD-10-CM | POA: Diagnosis not present

## 2023-01-18 DIAGNOSIS — R8290 Unspecified abnormal findings in urine: Secondary | ICD-10-CM | POA: Diagnosis not present

## 2023-02-08 ENCOUNTER — Ambulatory Visit (INDEPENDENT_AMBULATORY_CARE_PROVIDER_SITE_OTHER): Payer: Medicare HMO

## 2023-02-08 VITALS — BP 98/50 | Ht 66.0 in | Wt 128.8 lb

## 2023-02-08 DIAGNOSIS — Z0000 Encounter for general adult medical examination without abnormal findings: Secondary | ICD-10-CM | POA: Diagnosis not present

## 2023-02-08 DIAGNOSIS — Z23 Encounter for immunization: Secondary | ICD-10-CM

## 2023-02-08 NOTE — Progress Notes (Signed)
Subjective:   Edward Cummings is a 89 y.o. male who presents for Medicare Annual/Subsequent preventive examination.  Visit Complete: In person   Review of Systems     Cardiac Risk Factors include: advanced age (>64men, >7 women);smoking/ tobacco exposure;hypertension;male gender     Objective:    Today's Vitals   02/08/23 1021  BP: (!) 98/50  Weight: 128 lb 12.8 oz (58.4 kg)  Height: 5\' 6"  (1.676 m)   Body mass index is 20.79 kg/m.     02/08/2023   10:29 AM 01/02/2023    8:10 PM 11/28/2022    1:40 PM 11/05/2022    8:58 AM 10/17/2022   11:00 AM 10/04/2022   11:04 AM 09/12/2022   12:58 PM  Advanced Directives  Does Patient Have a Medical Advance Directive? Yes No No No No No No  Type of Estate agent of [place];Living will        Does patient want to make changes to medical advance directive? No - Patient declined      No - Patient declined  Copy of Healthcare Power of Attorney in Chart? Yes - validated most recent copy scanned in chart (See row information)        Would patient like information on creating a medical advance directive?     No - Patient declined      Current Medications (verified) Outpatient Encounter Medications as of 02/08/2023  Medication Sig   amLODipine (NORVASC) 10 MG tablet TAKE ONE TABLET BY MOUTH ONCE DAILY   aspirin EC 81 MG tablet Take 81 mg by mouth daily.   atorvastatin (LIPITOR) 10 MG tablet Take 1 tablet (10 mg total) by mouth daily.   Ipratropium-Albuterol (COMBIVENT RESPIMAT) 20-100 MCG/ACT AERS respimat Inhale 1 puff into the lungs every 6 (six) hours as needed for wheezing or shortness of breath.   levothyroxine (SYNTHROID) 75 MCG tablet TAKE ONE TABLET BY MOUTH EVERY MORNING BEFORE BREAKFAST   loratadine (CLARITIN) 10 MG tablet TAKE 1 TABLET BY MOUTH ONCE A DAY   mometasone (NASONEX) 50 MCG/ACT nasal spray PLACE TWO SPRAYS INTO THE NOSE DAILY   Multiple Vitamin (MULTIVITAMIN WITH MINERALS) TABS tablet Take 1 tablet by  mouth daily.    omeprazole (PRILOSEC) 40 MG capsule Take 1 capsule (40 mg total) by mouth daily.   ondansetron (ZOFRAN) 4 MG tablet Take 1 tablet (4 mg total) by mouth daily as needed for nausea or vomiting.   prednisoLONE acetate (PRED FORTE) 1 % ophthalmic suspension Place 1 drop into both eyes daily at 2 PM.   predniSONE (DELTASONE) 5 MG tablet TAKE ONE TABLET BY MOUTH ONCE A DAY WITH BREAKFAST.   amoxicillin-clavulanate (AUGMENTIN) 875-125 MG tablet Take 1 tablet by mouth 2 (two) times daily. (Patient not taking: Reported on 02/08/2023)   dextromethorphan-guaiFENesin (MUCINEX DM) 30-600 MG 12hr tablet Take 1 tablet by mouth 2 (two) times daily as needed for cough. (Patient not taking: Reported on 02/08/2023)   No facility-administered encounter medications on file as of 02/08/2023.    Allergies (verified) Patient has no known allergies.   History: Past Medical History:  Diagnosis Date   Anemia    Anxiety 12/31/2019   Cancer [facility])    bladder   Chronic kidney disease    Depression    Hypertension    Neuromuscular disorder (HCC)    Nerve damage to left face/eye since around 2002.   Past Surgical History:  Procedure Laterality Date   CYSTOSCOPY W/ RETROGRADES Bilateral 01/25/2018  Procedure: CYSTOSCOPY WITH RETROGRADE PYELOGRAM;  Surgeon: Riki Altes, MD;  [name];  Service: Urology;  Laterality: Bilateral;   CYSTOSCOPY W/ URETERAL STENT PLACEMENT Bilateral 01/06/2019   Procedure: CYSTOSCOPY WITH RETROGRADE PYELOGRAM/URETERAL STENT REMOVAL;  Surgeon: Riki Altes, MD;  [name];  Service: Urology;  Laterality: Bilateral;   CYSTOSCOPY WITH BIOPSY N/A 09/21/2020   Procedure: CYSTOSCOPY WITH BLADDER BIOPSY;  Surgeon: Riki Altes, MD;  [name];  Service: Urology;  [name];   CYSTOSCOPY WITH FULGERATION N/A 09/21/2020   Procedure: CYSTOSCOPY WITH FULGERATION;  Surgeon: Riki Altes, MD;  [name];  Service: Urology;   [name];   CYSTOSCOPY WITH STENT PLACEMENT Bilateral 01/25/2018   Procedure: CYSTOSCOPY WITH STENT PLACEMENT;  Surgeon: Riki Altes, MD;  [name];  Service: Urology;  Laterality: Bilateral;   DORSAL SLIT N/A 01/06/2019   Procedure: DORSAL SLIT;  Surgeon: Riki Altes, MD;  [name];  Service: Urology;  [name];   ESOPHAGOGASTRODUODENOSCOPY (EGD) WITH PROPOFOL N/A 11/12/2019   Procedure: ESOPHAGOGASTRODUODENOSCOPY (EGD) WITH PROPOFOL;  Surgeon: Pasty Spillers, MD;  Location: [facility] END[facility];  Service: Endoscopy;  [name];   EYE SURGERY     Cornea transplants bilaterally & cataract surgery.   TRANSURETHRAL RESECTION OF BLADDER TUMOR N/A 01/25/2018   Procedure: TRANSURETHRAL RESECTION OF BLADDER TUMOR (TURBT);  Surgeon: Riki Altes, MD;  [name];  Service: Urology;  [name];   Family History  Problem Relation Age of Onset   Prostate cancer Neg Hx    Kidney cancer Neg Hx    Bladder Cancer Neg Hx    Cancer - Prostate Neg Hx    Social History   Socioeconomic History   Marital status: Widowed    Spouse name: Talbert Forest   Number of children: 2   Years of education: 6th grade   Highest education level: 6th grade  Occupational History   Not on file  Tobacco Use   Smoking status: Former    Current packs/day: 0.00    Types: Cigarettes    Quit date: 02/20/09    Years since quitting: 14.6   Smokeless tobacco: Former    Types: Chew   Tobacco comments:    Stopped approximately 10 years ago.  Vaping Use   Vaping status: Never Used  Substance and Sexual Activity   Alcohol use: Yes    Alcohol/week: 2.0 standard drinks of alcohol    Types: 2 Cans of beer per week    Comment: occasionally   Drug use: Never   Sexual activity: Not Currently    Birth control/protection: None  Other Topics Concern   Not on file  Social History Narrative    lives in [place]; with oldest son. Wife died in February 21, 2019.  quit smoking 18 years  ago; beer every 2 months or so.mechanic/retd.  Very supportive family. Pt still drives and is indepent with no serious chronic disease. Worked in [employer]    Social Determinants of Health   Financial Resource Strain: Low Risk  (02/08/2023)   Overall Financial Resource Strain (CARDIA)    Difficulty of Paying Living Expenses: Not hard at all  Food Insecurity: No Food Insecurity (02/08/2023)   Hunger Vital Sign    Worried About Running Out of Food in the Last Year: Never true    Ran Out of Food in the Last Year: Never true  Transportation Needs: No Transportation Needs (02/08/2023)   PRAPARE - Transportation  Lack of Transportation (Medical): No    Lack of Transportation (Non-Medical): No  Physical Activity: Insufficiently Active (02/08/2023)   Exercise Vital Sign    Days of Exercise per Week: 3 days    Minutes of Exercise per Session: 30 min  Stress: No Stress Concern Present (02/08/2023)   [employer] of Occupational Health - Occupational Stress Questionnaire    Feeling of Stress : Only a little  Social Connections: Socially Isolated (02/08/2023)   Social Connection and Isolation Panel [NHANES]    Frequency of Communication with Friends and Family: More than three times a week    Frequency of Social Gatherings with Friends and Family: More than three times a week    Attends Religious Services: Never    Database administrator or Organizations: No    Attends Banker Meetings: Never    Marital Status: Widowed    Tobacco Counseling Counseling given: Not Answered Tobacco comments: Stopped approximately 10 years ago.   Clinical Intake:  Pre-visit preparation completed: Yes  Pain : No/denies pain     Nutritional Risks: None Diabetes: No  How often do you need to have someone help you when you read instructions, pamphlets, or other written materials from your doctor or pharmacy?: 1 - Never  Interpreter Needed?: No  Information entered by :: Kennedy Bucker, LPN   Activities of Daily Living    02/08/2023   10:30 AM 11/30/2022    2:38 PM  In your present state of health, do you have any difficulty performing the following activities:  Hearing? 0 1  Vision? 0 1  Difficulty concentrating or making decisions? 0 0  Walking or climbing stairs? 0 1  Dressing or bathing? 0 0  Doing errands, shopping? 0 1  Preparing Food and eating ? N   Using the Toilet? N   In the past six months, have you accidently leaked urine? N   Do you have problems with loss of bowel control? N   Managing your Medications? N   Managing your Finances? N   Housekeeping or managing your Housekeeping? Y     Patient Care Team: Lorre Munroe, NP as PCP - General (Internal Medicine) Earna Coder, MD as Consulting Physician (Hematology and Oncology) Pasty Spillers, MD (Inactive) as Consulting Physician (Gastroenterology) Geanie Logan, MD as Referring Physician (Otolaryngology) Vida Rigger, MD as Consulting Physician (Pulmonary Disease) Riki Altes, MD (Urology)  Indicate any recent Medical Services you may have received from other than [facility] providers in the past year (date may be approximate).     Assessment:   This is a routine wellness examination for G[place].  Hearing/Vision screen Hearing Screening - Comments:: No aids Vision Screening - Comments:: No glasses- [facility]  Dietary issues and exercise activities discussed:     Goals Addressed             This Visit's Progress    DIET - EAT MORE FRUITS AND VEGETABLES         Depression Screen    02/08/2023   10:27 AM 01/01/2023    1:48 PM 11/30/2022    2:38 PM 03/23/2022    8:59 AM 12/01/2021    2:31 PM 11/11/2021    9:48 AM 09/12/2021   12:32 PM  PHQ 2/9 Scores  PHQ - 2 Score 2 0 0 0 1 0 1  PHQ- 9 Score 4 0  0  0     Fall Risk    02/08/2023  10:30 AM 11/30/2022    2:38 PM 12/26/2021   11:39 AM 12/01/2021    2:19 PM 11/11/2021    9:47 AM  Fall Risk   Falls in  the past year? 0 0 0 0 0  Number falls in past yr: 0  0 0 0  Injury with Fall? 0 0 0 0 0  Risk for fall due to : No Fall Risks No Fall Risks No Fall Risks History of fall(s);Impaired balance/gait;Impaired mobility;Impaired vision No Fall Risks  Follow up Falls prevention discussed;Falls evaluation completed  Falls evaluation completed Falls prevention discussed;Education provided;Falls evaluation completed Falls evaluation completed    MEDICARE RISK AT HOME: Medicare Risk at Home Any stairs in or around the home?: Yes If so, are there any without handrails?: No Home free of loose throw rugs in walkways, pet beds, electrical cords, etc?: Yes Adequate lighting in your home to reduce risk of falls?: Yes Life alert?: No Use of a cane, walker or w/c?: No Grab bars in the bathroom?: Yes Shower chair or bench in shower?: Yes Elevated toilet seat or a handicapped toilet?: No  TIMED UP AND GO:  Was the test performed?  Yes  Length of time to ambulate 10 feet: 4 sec Gait steady and fast without use of assistive device    Cognitive Function:        02/08/2023   10:32 AM 05/27/2021   10:35 AM  6CIT Screen  What Year? 0 points 0 points  What month? 0 points 0 points  What time? 0 points 0 points  Count back from 20 0 points 0 points  Months in reverse 4 points 0 points  Repeat phrase 10 points 2 points  Total Score 14 points 2 points    Immunizations Immunization History  Administered Date(s) Administered   Fluad Quad(high Dose 65+) 03/23/2022   Influenza-Unspecified 04/12/2021   PFIZER(Purple Top)SARS-COV-2 Vaccination 08/08/2019, 08/29/2019, 03/09/2020   Pneumococcal Conjugate-13 02/19/2015   Pneumococcal Polysaccharide-23 04/08/2018, 02/20/2020   Tdap 01/01/2023   Zoster Recombinant(Shingrix) 02/26/2017, 05/15/2017    TDAP status: Up to date  Flu Vaccine status: Completed at today's visit  Pneumococcal vaccine status: Up to date  Covid-19 vaccine status: Completed  vaccines  Qualifies for Shingles Vaccine? Yes   Zostavax completed No   Shingrix Completed?: Yes  Screening Tests Health Maintenance  Topic Date Due   INFLUENZA VACCINE  01/11/2023   Medicare Annual Wellness (AWV)  02/08/2024   Pneumonia Vaccine 36+ Years old  Completed   Zoster Vaccines- Shingrix  Completed   HPV VACCINES  Aged Out   DTaP/Tdap/Td  Discontinued   COVID-19 Vaccine  Discontinued    Health Maintenance  Health Maintenance Due  Topic Date Due   INFLUENZA VACCINE  01/11/2023    Colorectal cancer screening: No longer required.   Lung Cancer Screening: (Low Dose CT Chest recommended if Age 33-80 years, 20 pack-year currently smoking OR have quit w/in 15years.) does not qualify.    Additional Screening:  Hepatitis C Screening: does not qualify; Completed no  Vision Screening: Recommended annual ophthalmology exams for early detection of glaucoma and other disorders of the eye. Is the patient up to date with their annual eye exam?  Yes  Who is the provider or what is the name of the office in which the patient attends annual eye exams? [facility] If pt is not established with a provider, would they like to be referred to a provider to establish care? No .   Dental Screening:  Recommended annual dental exams for proper oral hygiene   Community Resource Referral / Chronic Care Management: CRR required this visit?  No   CCM required this visit?  No     Plan:     I have personally reviewed and noted the following in the patient's chart:   Medical and social history Use of alcohol, tobacco or illicit drugs  Current medications and supplements including opioid prescriptions. Patient is not currently taking opioid prescriptions. Functional ability and status Nutritional status Physical activity Advanced directives List of other physicians Hospitalizations, surgeries, and ER visits in previous 12 months Vitals Screenings to include cognitive,  depression, and falls Referrals and appointments  In addition, I have reviewed and discussed with patient certain preventive protocols, quality metrics, and best practice recommendations. A written personalized care plan for preventive services as well as general preventive health recommendations were provided to patient.     Hal Hope, LPN   [id number]/[id number]   After Visit Summary: my chart  Nurse Notes: none

## 2023-02-08 NOTE — Patient Instructions (Addendum)
Mr. Whitfield , Thank you for taking time to come for your Medicare Wellness Visit. I appreciate your ongoing commitment to your health goals. Please review the following plan we discussed and let me know if I can assist you in the future.   Referrals/Orders/Follow-Ups/Clinician Recommendations: none  This is a list of the screening recommended for you and due dates:  Health Maintenance  Topic Date Due   Flu Shot  01/11/2023   Medicare Annual Wellness Visit  02/08/2024   Pneumonia Vaccine  Completed   Zoster (Shingles) Vaccine  Completed   HPV Vaccine  Aged Out   DTaP/Tdap/Td vaccine  Discontinued   COVID-19 Vaccine  Discontinued    Advanced directives: (In Chart) A copy of your advanced directives are scanned into your chart should your provider ever need it.  Next Medicare Annual Wellness Visit scheduled for next year: Yes    02/14/24 @ 10:15 am in person

## 2023-02-27 ENCOUNTER — Ambulatory Visit: Payer: Self-pay

## 2023-02-27 ENCOUNTER — Emergency Department (HOSPITAL_COMMUNITY)
Admission: EM | Admit: 2023-02-27 | Discharge: 2023-02-27 | Discharge status: Against Medical Advice | Payer: Medicare HMO | Attending: Emergency Medicine | Admitting: Emergency Medicine

## 2023-02-27 ENCOUNTER — Emergency Department (HOSPITAL_COMMUNITY): Payer: Medicare HMO

## 2023-02-27 ENCOUNTER — Other Ambulatory Visit: Payer: Self-pay

## 2023-02-27 ENCOUNTER — Encounter (HOSPITAL_COMMUNITY): Payer: Self-pay | Admitting: Emergency Medicine

## 2023-02-27 DIAGNOSIS — Z5321 Procedure and treatment not carried out due to patient leaving prior to being seen by health care provider: Secondary | ICD-10-CM | POA: Diagnosis not present

## 2023-02-27 DIAGNOSIS — R0602 Shortness of breath: Secondary | ICD-10-CM | POA: Diagnosis not present

## 2023-02-27 DIAGNOSIS — K449 Diaphragmatic hernia without obstruction or gangrene: Secondary | ICD-10-CM | POA: Diagnosis not present

## 2023-02-27 DIAGNOSIS — R079 Chest pain, unspecified: Secondary | ICD-10-CM | POA: Insufficient documentation

## 2023-02-27 DIAGNOSIS — M25512 Pain in left shoulder: Secondary | ICD-10-CM | POA: Diagnosis not present

## 2023-02-27 LAB — CBC
HCT: 36.3 % — ABNORMAL LOW (ref 39.0–52.0)
Hemoglobin: 11.6 g/dL — ABNORMAL LOW (ref 13.0–17.0)
MCH: 31 pg (ref 26.0–34.0)
MCHC: 32 g/dL (ref 30.0–36.0)
MCV: 97.1 fL (ref 80.0–100.0)
Platelets: 271 10*3/uL (ref 150–400)
RBC: 3.74 MIL/uL — ABNORMAL LOW (ref 4.22–5.81)
RDW: 13 % (ref 11.5–15.5)
WBC: 9.8 10*3/uL (ref 4.0–10.5)
nRBC: 0 % (ref 0.0–0.2)

## 2023-02-27 LAB — BASIC METABOLIC PANEL
Anion gap: 11 (ref 5–15)
BUN: 34 mg/dL — ABNORMAL HIGH (ref 8–23)
CO2: 26 mmol/L (ref 22–32)
Calcium: 9.9 mg/dL (ref 8.9–10.3)
Chloride: 102 mmol/L (ref 98–111)
Creatinine, Ser: 2.49 mg/dL — ABNORMAL HIGH (ref 0.61–1.24)
GFR, Estimated: 24 mL/min — ABNORMAL LOW (ref >60)
Glucose, Bld: 151 mg/dL — ABNORMAL HIGH (ref 70–99)
Potassium: 4.4 mmol/L (ref 3.5–5.1)
Sodium: 139 mmol/L (ref 135–145)

## 2023-02-27 LAB — TROPONIN I (HIGH SENSITIVITY): Troponin I (High Sensitivity): 11 ng/L (ref <18)

## 2023-02-27 NOTE — ED Notes (Signed)
PT left with son due to wait time

## 2023-02-27 NOTE — ED Triage Notes (Signed)
Pt here from home with family with c/o left shoulder pain and some slight chest pain , pt also has some sob and cp  with deep breaths

## 2023-02-27 NOTE — Telephone Encounter (Signed)
  Chief Complaint: High BP reading - left shoulder pain Symptoms: doesn't feel quite right Frequency: shoulder pain a couple of months. Not feeling quite right  - today Pertinent Negatives: Patient denies  Disposition: [x] ED /[] Urgent Care (no appt availability in office) / [] Appointment(In office/virtual)/ []  [facility]/ [] Home Care/ [] Refused Recommended Disposition /[] [facility]/ []  Follow-up with PCP Additional Notes: Call from pt's son michael. Michael on [facility] and enroute to pr's home. Pt also states it hurts when he take a deep breath. Shoulder pain for a couple of months - pain comes and goes, lasting about 30 minutes. Son will take pt to ED for care.    Reason for Disposition  [1] Systolic BP  >= 160 OR Diastolic >= 100 AND [2] cardiac (e.g., breathing difficulty, chest pain) or neurologic symptoms (e.g., new-onset blurred or double vision, unsteady gait)  Answer Assessment - Initial Assessment Questions 1. BLOOD PRESSURE: "What is the blood pressure?" "Did you take at least two measurements 5 minutes apart?"     189/79 - 20 minutes ago. 2. ONSET: "When did you take your blood pressure?"     20 minutes 3. HOW: "How did you take your blood pressure?" (e.g., automatic home BP monitor, visiting nurse)     automatic 4. HISTORY: "Do you have a history of high blood pressure?"     yes 5. MEDICINES: "Are you taking any medicines for blood pressure?" "Have you missed any doses recently?"     unsknown 6. OTHER SYMPTOMS: "Do you have any symptoms?" (e.g., blurred vision, chest pain, difficulty breathing, headache, weakness)     Shoulder hurting - has been hurting for a couple of months off and on lasting 30 minutes  Protocols used: Blood Pressure - High-A-AH

## 2023-02-28 ENCOUNTER — Ambulatory Visit: Payer: Self-pay

## 2023-02-28 NOTE — Telephone Encounter (Signed)
Reviewed the schedule again... there are no openings.

## 2023-02-28 NOTE — Telephone Encounter (Signed)
      Chief Complaint: Had chest pain last night, went to ED. Pt. "Couldn't stay any longer so we left." No chest pain currently. Has "esophagus problems and I think that is causing the problem." Asking to be worked in. Symptoms: No pain today Frequency: Yesterday Pertinent Negatives: Patient denies pain now. Disposition: [] ED /[] Urgent Care (no appt availability in office) / [] Appointment(In office/virtual)/ []  [facility]/ [] Home Care/ [] Refused Recommended Disposition /[] [facility]/ [x]  Follow-up with PCP Additional Notes: Please advise son.  Reason for Disposition  [1] Chest pain lasts > 5 minutes AND [2] occurred > 3 days ago (72 hours) AND [3] NO chest pain or cardiac symptoms now  Answer Assessment - Initial Assessment Questions 1. LOCATION: "Where does it hurt?"       At upper ribs, hurts after eating 2. RADIATION: "Does the pain go anywhere else?" (e.g., into neck, jaw, arms, back)     Shoulder pain, left 3. ONSET: "When did the chest pain begin?" (Minutes, hours or days)      Yesterday 4. PATTERN: "Does the pain come and go, or has it been constant since it started?"  "Does it get worse with exertion?"      Comes and goes  5. DURATION: "How long does it last" (e.g., seconds, minutes, hours)     25 minutes 6. SEVERITY: "How bad is the pain?"  (e.g., Scale 1-10; mild, moderate, or severe)    - MILD (1-3): doesn't interfere with normal activities     - MODERATE (4-7): interferes with normal activities or awakens from sleep    - SEVERE (8-10): excruciating pain, unable to do any normal activities       Moderate 7. CARDIAC RISK FACTORS: "Do you have any history of heart problems or risk factors for heart disease?" (e.g., angina, prior heart attack; diabetes, high blood pressure, high cholesterol, smoker, or strong family history of heart disease)     No 8. PULMONARY RISK FACTORS: "Do you have any history of lung disease?"  (e.g., blood clots in lung,  asthma, emphysema, birth control pills)     Pneumonia  9. CAUSE: "What do you think is causing the chest pain?"     Esophagus  10. OTHER SYMPTOMS: "Do you have any other symptoms?" (e.g., dizziness, nausea, vomiting, sweating, fever, difficulty breathing, cough)       No 11. PREGNANCY: "Is there any chance you are pregnant?" "When was your last menstrual period?"       N/a  Protocols used: Chest Pain-A-AH

## 2023-02-28 NOTE — Telephone Encounter (Signed)
It looks like he went to the ER but left without being seen

## 2023-02-28 NOTE — Telephone Encounter (Signed)
FYI

## 2023-03-01 ENCOUNTER — Encounter: Payer: Self-pay | Admitting: Internal Medicine

## 2023-03-01 ENCOUNTER — Ambulatory Visit (INDEPENDENT_AMBULATORY_CARE_PROVIDER_SITE_OTHER): Payer: Medicare HMO | Admitting: Internal Medicine

## 2023-03-01 VITALS — BP 124/72 | HR 65 | Temp 96.6°F | Wt 131.0 lb

## 2023-03-01 DIAGNOSIS — K449 Diaphragmatic hernia without obstruction or gangrene: Secondary | ICD-10-CM | POA: Diagnosis not present

## 2023-03-01 DIAGNOSIS — R1310 Dysphagia, unspecified: Secondary | ICD-10-CM

## 2023-03-01 DIAGNOSIS — R0789 Other chest pain: Secondary | ICD-10-CM

## 2023-03-01 DIAGNOSIS — K219 Gastro-esophageal reflux disease without esophagitis: Secondary | ICD-10-CM

## 2023-03-01 DIAGNOSIS — R1013 Epigastric pain: Secondary | ICD-10-CM

## 2023-03-01 NOTE — Patient Instructions (Signed)
Hiatal Hernia  A hiatal hernia occurs when part of the stomach slides above the muscle that separates the abdomen from the chest (diaphragm). A person can be born with a hiatal hernia (congenital), or it may develop over time. In almost all cases of hiatal hernia, only the top part of the stomach pushes through the diaphragm. Many people have a hiatal hernia with no symptoms. The larger the hernia, the more likely it is that you will have symptoms. In some cases, a hiatal hernia allows stomach acid to flow back into the tube that carries food from your mouth to your stomach (esophagus). This may cause heartburn symptoms. The development of heartburn symptoms may mean that you have a condition called gastroesophageal reflux disease (GERD). What are the causes? This condition is caused by a weakness in the opening (hiatus) where the esophagus passes through the diaphragm to attach to the upper part of the stomach. A person may be born with a weakness in the hiatus, or a weakness can develop over time. What increases the risk? This condition is more likely to develop in: Older people. Age is a major risk factor for a hiatal hernia, especially if you are over the age of 11. Pregnant women. People who are overweight. People who have frequent constipation. What are the signs or symptoms? Symptoms of this condition usually develop in the form of GERD symptoms. Symptoms include: Heartburn. Upset stomach (indigestion). Trouble swallowing. Coughing or wheezing. Wheezing is making high-pitched whistling sounds when you breathe. Sore throat. Chest pain. Nausea and vomiting. How is this diagnosed? This condition may be diagnosed during testing for GERD. Tests that may be done include: X-rays of your stomach or chest. An upper gastrointestinal (GI) series. This is an X-ray exam of your GI tract that is taken after you swallow a chalky liquid that shows up clearly on the X-ray. Endoscopy. This is a  procedure to look into your stomach using a thin, flexible tube that has a tiny camera and light on the end of it. How is this treated? This condition may be treated by: Dietary and lifestyle changes to help reduce GERD symptoms. Medicines. These may include: Over-the-counter antacids. Medicines that make your stomach empty more quickly. Medicines that block the production of stomach acid (H2 blockers). Stronger medicines to reduce stomach acid (proton pump inhibitors). Surgery to repair the hernia, if other treatments are not helping. If you have no symptoms, you may not need treatment. Follow these instructions at home: Lifestyle and activity Do not use any products that contain nicotine or tobacco. These products include cigarettes, chewing tobacco, and vaping devices, such as e-cigarettes. If you need help quitting, ask your health care provider. Try to achieve and maintain a healthy body weight. Avoid putting pressure on your abdomen. Anything that puts pressure on your abdomen increases the amount of acid that may be pushed up into your esophagus. Avoid bending over, especially after eating. Raise the head of your bed by putting blocks under the legs. This keeps your head and esophagus higher than your stomach. Do not wear tight clothing around your chest or stomach. Try not to strain when having a bowel movement, when urinating, or when lifting heavy objects. Eating and drinking Avoid foods that can worsen GERD symptoms. These may include: Fatty foods, like fried foods. Citrus fruits, like oranges or lemon. Other foods and drinks that contain acid, like orange juice or tomatoes. Spicy food. Chocolate. Eat frequent small meals instead of three large meals a  day. This helps prevent your stomach from getting too full. Eat slowly. Do not lie down right after eating. Do not eat 1-2 hours before bed. Do not drink beverages with caffeine. These include cola, coffee, cocoa, and tea. Do  not drink alcohol. General instructions Take over-the-counter and prescription medicines only as told by your health care provider. Keep all follow-up visits. Your health care provider will want to check that any new prescribed medicines are helping your symptoms. Contact a health care provider if: Your symptoms are not controlled with medicines or lifestyle changes. You are having trouble swallowing. You have coughing or wheezing that will not go away. Your pain is getting worse. Your pain spreads to your arms, neck, jaw, teeth, or back. You feel nauseous or you vomit. Get help right away if: You have shortness of breath. You vomit blood. You have bright red blood in your stools. You have black, tarry stools. These symptoms may be an emergency. Get help right away. Call 911. Do not wait to see if the symptoms will go away. Do not drive yourself to the hospital. Summary A hiatal hernia occurs when part of the stomach slides above the muscle that separates the abdomen from the chest. A person may be born with a weakness in the hiatus, or a weakness can develop over time. Symptoms of a hiatal hernia may include heartburn, trouble swallowing, or sore throat. Management of a hiatal hernia includes eating frequent small meals instead of three large meals a day. Get help right away if you vomit blood, have bright red blood in your stools, or have black, tarry stools. This information is not intended to replace advice given to you by your health care provider. Make sure you discuss any questions you have with your health care provider. Document Revised: 07/26/2021 Document Reviewed: 07/26/2021 Elsevier Patient Education  2024 [employer].

## 2023-03-01 NOTE — Progress Notes (Signed)
Subjective:    Patient ID: Edward Cummings, male    DOB: 07/27/1932, 89 y.o.   MRN: [id number]  HPI  Patient presents to clinic today with complaint of chest pain. He reports this started 1 month ago. He describes the pain as soreness. The pain is worse after eating. He is not choking on his foods but he has difficulty swallowing at times to the point that he has vomits.  He went to the ER 9/17 for the same but left without being seen.  CBC CMP showed a chronic improving anemia.  Metabolic panel showed CKD slightly improved from his last check with a creatinine of 2.49 and GFR of 24.  Electrolytes were normal.  Troponin was negative.  Chest x-ray showed hyperinflation with chronic changes, hiatal hernia but no acute concern for infiltrate or edema.  ECG showed sinus rhythm with occasional PVCs and PACs. He has a history of GERD and reports this feels similar. He is taking omeprazole as prescribed. Upper GI from 11/2019 reviewed. He has an appt with GI 9/23.   Review of Systems     Past Medical History:  Diagnosis Date   Anemia    Anxiety 12/31/2019   Cancer [facility])    bladder   Chronic kidney disease    Depression    Hypertension    Neuromuscular disorder (HCC)    Nerve damage to left face/eye since around 2002.    Current Outpatient Medications  Medication Sig Dispense Refill   amLODipine (NORVASC) 10 MG tablet TAKE ONE TABLET BY MOUTH ONCE DAILY 90 tablet 1   amoxicillin-clavulanate (AUGMENTIN) 875-125 MG tablet Take 1 tablet by mouth 2 (two) times daily. (Patient not taking: Reported on 02/08/2023) 20 tablet 0   aspirin EC 81 MG tablet Take 81 mg by mouth daily.     atorvastatin (LIPITOR) 10 MG tablet Take 1 tablet (10 mg total) by mouth daily. 90 tablet 1   dextromethorphan-guaiFENesin (MUCINEX DM) 30-600 MG 12hr tablet Take 1 tablet by mouth 2 (two) times daily as needed for cough. (Patient not taking: Reported on 02/08/2023) 30 tablet 0   Ipratropium-Albuterol (COMBIVENT RESPIMAT)  20-100 MCG/ACT AERS respimat Inhale 1 puff into the lungs every 6 (six) hours as needed for wheezing or shortness of breath. 4 g 0   levothyroxine (SYNTHROID) 75 MCG tablet TAKE ONE TABLET BY MOUTH EVERY MORNING BEFORE BREAKFAST 30 tablet 3   loratadine (CLARITIN) 10 MG tablet TAKE 1 TABLET BY MOUTH ONCE A DAY 30 tablet 11   mometasone (NASONEX) 50 MCG/ACT nasal spray PLACE TWO SPRAYS INTO THE NOSE DAILY 17 g 3   Multiple Vitamin (MULTIVITAMIN WITH MINERALS) TABS tablet Take 1 tablet by mouth daily.      omeprazole (PRILOSEC) 40 MG capsule Take 1 capsule (40 mg total) by mouth daily. 90 capsule 1   ondansetron (ZOFRAN) 4 MG tablet Take 1 tablet (4 mg total) by mouth daily as needed for nausea or vomiting. 30 tablet 0   prednisoLONE acetate (PRED FORTE) 1 % ophthalmic suspension Place 1 drop into both eyes daily at 2 PM. 5 mL 4   predniSONE (DELTASONE) 5 MG tablet TAKE ONE TABLET BY MOUTH ONCE A DAY WITH BREAKFAST. 60 tablet 1   No current facility-administered medications for this visit.    No Known Allergies  Family History  Problem Relation Age of Onset   Prostate cancer Neg Hx    Kidney cancer Neg Hx    Bladder Cancer Neg Hx  Cancer - Prostate Neg Hx     Social History   Socioeconomic History   Marital status: Widowed    Spouse name: Talbert Forest   Number of children: 2   Years of education: 6th grade   Highest education level: 6th grade  Occupational History   Not on file  Tobacco Use   Smoking status: Former    Current packs/day: 0.00    Types: Cigarettes    Quit date: 05/28/09    Years since quitting: 14.7   Smokeless tobacco: Former    Types: Chew   Tobacco comments:    Stopped approximately 10 years ago.  Vaping Use   Vaping status: Never Used  Substance and Sexual Activity   Alcohol use: Yes    Alcohol/week: 2.0 standard drinks of alcohol    Types: 2 Cans of beer per week    Comment: occasionally   Drug use: Never   Sexual activity: Not Currently    Birth  control/protection: None  Other Topics Concern   Not on file  Social History Narrative    lives in [place]; with oldest son. Wife died in 05/29/2019.  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 Pt still drives and is indepent with no serious chronic disease. Worked in [employer]    Social Determinants of Health   Financial Resource Strain: Low Risk  (02/08/2023)   Overall Financial Resource Strain (CARDIA)    Difficulty of Paying Living Expenses: Not hard at all  Food Insecurity: No Food Insecurity (02/08/2023)   Hunger Vital Sign    Worried About Running Out of Food in the Last Year: Never true    Ran Out of Food in the Last Year: Never true  Transportation Needs: No Transportation Needs (02/08/2023)   PRAPARE - Administrator, Civil Service (Medical): No    Lack of Transportation (Non-Medical): No  Physical Activity: Insufficiently Active (02/08/2023)   Exercise Vital Sign    Days of Exercise per Week: 3 days    Minutes of Exercise per Session: 30 min  Stress: No Stress Concern Present (02/08/2023)   [employer] of Occupational Health - Occupational Stress Questionnaire    Feeling of Stress : Only a little  Social Connections: Socially Isolated (02/08/2023)   Social Connection and Isolation Panel [NHANES]    Frequency of Communication with Friends and Family: More than three times a week    Frequency of Social Gatherings with Friends and Family: More than three times a week    Attends Religious Services: Never    Database administrator or Organizations: No    Attends Banker Meetings: Never    Marital Status: Widowed  Intimate Partner Violence: Not At Risk (02/08/2023)   Humiliation, Afraid, Rape, and Kick questionnaire    Fear of Current or Ex-Partner: No    Emotionally Abused: No    Physically Abused: No    Sexually Abused: No     Constitutional: Denies fever, malaise, fatigue, headache or abrupt  weight changes.  HEENT: Denies eye pain, eye redness, ear pain, ringing in the ears, wax buildup, runny nose, nasal congestion, bloody nose, or sore throat. Respiratory: Denies difficulty breathing, shortness of breath, cough or sputum production.   Cardiovascular: Pt reports intermittent chest tightness.Denies chest pain, chest tightness, palpitations or swelling in the hands or feet.  Gastrointestinal: Pt reports intermittent constipation. Denies abdominal pain, bloating, diarrhea or blood in the stool.   No other  specific complaints in a complete review of systems (except as listed in HPI above).  Objective:   Physical Exam BP 124/72 (BP Location: Left Arm, Patient Position: Sitting, Cuff Size: Normal)   Pulse 65   Temp (!) 96.6 F (35.9 C) (Temporal)   Wt 131 lb (59.4 kg)   SpO2 100%   BMI 21.14 kg/m   Wt Readings from Last 3 Encounters:  02/08/23 128 lb 12.8 oz (58.4 kg)  01/03/23 130 lb (59 kg)  01/01/23 131 lb (59.4 kg)    General: Appears his stated age, well developed, well nourished in NAD. Cardiovascular: Normal rate and rhythm. S1,S2 noted.  No murmur, rubs or gallops noted. No JVD or BLE edema.  Pulmonary/Chest: Normal effort and positive vesicular breath sounds. No respiratory distress. No wheezes, rales or ronchi noted.  Abdomen: Soft and nontender. Normal bowel sounds. No distention or masses noted.  Musculoskeletal:  No difficulty with gait.  Neurological: Alert and oriented. Coordination normal.    BMET    Component Value Date/Time   NA 139 02/27/2023 1712   K 4.4 02/27/2023 1712   CL 102 02/27/2023 1712   CO2 26 02/27/2023 1712   GLUCOSE 151 (H) 02/27/2023 1712   BUN 34 (H) 02/27/2023 1712   CREATININE 2.49 (H) 02/27/2023 1712   CREATININE 2.81 (H) 11/28/2022 1320   CALCIUM 9.9 02/27/2023 1712   GFRNONAA 24 (L) 02/27/2023 1712   GFRNONAA 21 (L) 11/28/2022 1320   GFRAA 29 (L) 03/04/2020 0801    Lipid Panel     Component Value Date/Time   CHOL 212  (H) 11/30/2022 1435   TRIG 157 (H) 11/30/2022 1435   HDL 60 11/30/2022 1435   CHOLHDL 3.5 11/30/2022 1435   LDLCALC 125 (H) 11/30/2022 1435    CBC    Component Value Date/Time   WBC 9.8 02/27/2023 1712   RBC 3.74 (L) 02/27/2023 1712   HGB 11.6 (L) 02/27/2023 1712   HGB 11.0 (L) 11/28/2022 1320   HCT 36.3 (L) 02/27/2023 1712   PLT 271 02/27/2023 1712   PLT 209 11/28/2022 1320   MCV 97.1 02/27/2023 1712   MCH 31.0 02/27/2023 1712   MCHC 32.0 02/27/2023 1712   RDW 13.0 02/27/2023 1712   LYMPHSABS 1.9 11/28/2022 1320   MONOABS 0.6 11/28/2022 1320   EOSABS 0.5 11/28/2022 1320   BASOSABS 0.1 11/28/2022 1320    Hgb A1C Lab Results  Component Value Date   HGBA1C 5.7 01/03/2023           Assessment & Plan:   Chest tightness, epigastric pain, dysphagia, GERD, hiatal hernia:  ER labs and imaging reviewed with the patient and his son Will have him increase omeprazole to 40 mg twice daily He will keep his follow-up appointment with GI next week, hopefully they will schedule an upper GI and check an H. pylori as well Advised him to avoid eating and laying down within a 2-hour period  RTC in 3 months for annual exam Nicki Reaper, NP

## 2023-03-05 ENCOUNTER — Ambulatory Visit: Payer: Medicare HMO | Admitting: Gastroenterology

## 2023-03-05 ENCOUNTER — Other Ambulatory Visit: Payer: Self-pay | Admitting: *Deleted

## 2023-03-05 ENCOUNTER — Encounter: Payer: Self-pay | Admitting: Internal Medicine

## 2023-03-05 VITALS — BP 139/73 | HR 79 | Temp 97.8°F | Ht 66.0 in | Wt 130.4 lb

## 2023-03-05 DIAGNOSIS — R1319 Other dysphagia: Secondary | ICD-10-CM

## 2023-03-05 NOTE — Progress Notes (Signed)
[name] MD, MRCP(U.K) [address]  [address]  [place], [place] [address]  Main: [phone]  Fax: [phone]   Gastroenterology Consultation  Referring Provider:     Lorre Munroe, NP Primary Care Physician:  Lorre Munroe, NP Primary Gastroenterologist:  Dr. [name]  Reason for Consultation: Dysphagia        HPI:   Edward Cummings is a 89 y.o. y/o male referred for consultation & management  by  Lorre Munroe, NP.     He has been referred to see me for dysphagia.  Seen in 2021 at our office for dysphagia.  Underwent upper endoscopy in June 2021 by Dr. Maximino Greenland possible cricopharyngeal bar identified but no other strictures seen.  Biopsies of the GE junction showed features of reflux inflammation.  Biopsies of the esophagus showed focal increased intraepithelial lymphocytes 09/05/2022 CT scan of the chest abdomen pelvis without contrast showed emphysematous changes of the lung enlarged prostate moderate hiatal hernia no abnormalities grossly seen in the mediastinum.  02/27/2023 hemoglobin 11.6, BMP creatinine of 2.49.  He says that he has been in severe difficulty swallowing solids more than liquids for the past few months.  Food goes down and started regurgitating it after some time.  Even foods such as mashed potatoes may not go down.  Liquids go down better.  Does not really have any heartburn.  He has been started on acid suppression which has not really helped him.  He does not have any teeth but not planning to get any due to the cost  Past Medical History:  Diagnosis Date   Anemia    Anxiety 12/31/2019   Cancer [facility])    bladder   Chronic kidney disease    Depression    Hypertension    Neuromuscular disorder (HCC)    Nerve damage to left face/eye since around 2002.    Past Surgical History:  Procedure Laterality Date   CYSTOSCOPY W/ RETROGRADES Bilateral 01/25/2018   Procedure: CYSTOSCOPY WITH RETROGRADE PYELOGRAM;  Surgeon: Riki Altes, MD;   [name];  Service: Urology;  Laterality: Bilateral;   CYSTOSCOPY W/ URETERAL STENT PLACEMENT Bilateral 01/06/2019   Procedure: CYSTOSCOPY WITH RETROGRADE PYELOGRAM/URETERAL STENT REMOVAL;  Surgeon: Riki Altes, MD;  [name];  Service: Urology;  Laterality: Bilateral;   CYSTOSCOPY WITH BIOPSY N/A 09/21/2020   Procedure: CYSTOSCOPY WITH BLADDER BIOPSY;  Surgeon: Riki Altes, MD;  [name];  Service: Urology;  [name];   CYSTOSCOPY WITH FULGERATION N/A 09/21/2020   Procedure: CYSTOSCOPY WITH FULGERATION;  Surgeon: Riki Altes, MD;  [name];  Service: Urology;  [name];   CYSTOSCOPY WITH STENT PLACEMENT Bilateral 01/25/2018   Procedure: CYSTOSCOPY WITH STENT PLACEMENT;  Surgeon: Riki Altes, MD;  [name];  Service: Urology;  Laterality: Bilateral;   DORSAL SLIT N/A 01/06/2019   Procedure: DORSAL SLIT;  Surgeon: Riki Altes, MD;  [name];  Service: Urology;  [name];   ESOPHAGOGASTRODUODENOSCOPY (EGD) WITH PROPOFOL N/A 11/12/2019   Procedure: ESOPHAGOGASTRODUODENOSCOPY (EGD) WITH PROPOFOL;  Surgeon: Pasty Spillers, MD;  Location: [facility] ENDOSCOPY;  Service: Endoscopy;  [name];   EYE SURGERY     Cornea transplants bilaterally & cataract surgery.   TRANSURETHRAL RESECTION OF BLADDER TUMOR N/A 01/25/2018   Procedure: TRANSURETHRAL RESECTION OF BLADDER TUMOR (TURBT);  Surgeon: Riki Altes, MD;  [name];  Service: Urology;  [name];    Prior to  Admission medications   Medication Sig Start Date End Date Taking? Authorizing Provider  amLODipine (NORVASC) 10 MG tablet TAKE ONE TABLET BY MOUTH ONCE DAILY 08/25/22   Kara Dies, NP  aspirin EC 81 MG tablet Take 81 mg by mouth daily.    [provider]  atorvastatin (LIPITOR) 10 MG tablet Take 1 tablet (10 mg total) by mouth daily. 12/01/22   Lorre Munroe, NP  Ipratropium-Albuterol (COMBIVENT RESPIMAT)  20-100 MCG/ACT AERS respimat Inhale 1 puff into the lungs every 6 (six) hours as needed for wheezing or shortness of breath. 10/19/22   Drema Dallas, MD  levothyroxine (SYNTHROID) 75 MCG tablet TAKE ONE TABLET BY MOUTH EVERY MORNING BEFORE BREAKFAST 12/07/22   Earna Coder, MD  loratadine (CLARITIN) 10 MG tablet TAKE 1 TABLET BY MOUTH ONCE A DAY 12/26/21   Corky Downs, MD  mometasone (NASONEX) 50 MCG/ACT nasal spray PLACE TWO SPRAYS INTO THE NOSE DAILY 12/08/22   Lorre Munroe, NP  Multiple Vitamin (MULTIVITAMIN WITH MINERALS) TABS tablet Take 1 tablet by mouth daily.     [provider]  omeprazole (PRILOSEC) 40 MG capsule Take 1 capsule (40 mg total) by mouth daily. 01/01/23   Lorre Munroe, NP  ondansetron (ZOFRAN) 4 MG tablet Take 1 tablet (4 mg total) by mouth daily as needed for nausea or vomiting. 10/19/22 10/19/23  Drema Dallas, MD  prednisoLONE acetate (PRED FORTE) 1 % ophthalmic suspension Place 1 drop into both eyes daily at 2 PM. 10/28/21   Corky Downs, MD  predniSONE (DELTASONE) 5 MG tablet TAKE ONE TABLET BY MOUTH ONCE A DAY WITH BREAKFAST. 11/07/22   Earna Coder, MD    Family History  Problem Relation Age of Onset   Prostate cancer Neg Hx    Kidney cancer Neg Hx    Bladder Cancer Neg Hx    Cancer - Prostate Neg Hx      Social History   Tobacco Use   Smoking status: Former    Current packs/day: 0.00    Types: Cigarettes    Quit date: 2010    Years since quitting: 14.7   Smokeless tobacco: Former    Types: Chew   Tobacco comments:    Stopped approximately 10 years ago.  Vaping Use   Vaping status: Never Used  Substance Use Topics   Alcohol use: Yes    Alcohol/week: 2.0 standard drinks of alcohol    Types: 2 Cans of beer per week    Comment: occasionally   Drug use: Never    Allergies as of 03/05/2023   (No Known Allergies)    Review of Systems:    All systems reviewed and negative except where noted in HPI.   Physical Exam:   There were no vitals taken for this visit. No LMP for male patient. Psych:  Alert and cooperative. Normal mood and affect. General:   Alert,  Well-developed, appears thin cachectic, no dentition Head:  Normocephalic and atraumatic. Eyes:  Sclera clear, no icterus.   Conjunctiva pink.    Neurologic:  Alert and oriented x3;  grossly normal neurologically. Psych:  Alert and cooperative. Normal mood and affect.  Imaging Studies: DG Chest 2 View  Result Date: 02/27/2023 CLINICAL DATA:  Chest pain EXAM: CHEST - 2 VIEW COMPARISON:  11/05/2022 FINDINGS: Hyperinflation. No consolidation, pneumothorax or effusion. No edema. Normal cardiopericardial silhouette. Moderate hiatal hernia. Subtle dense lung nodules are stable, possibly related to old granulomatous disease. Air-fluid level along the stomach beneath  the left hemidiaphragm. IMPRESSION: Hyperinflation with chronic changes.  Hiatal hernia. Electronically Signed   By: Karen Kays M.D.   On: 02/27/2023 18:33    Assessment and Plan:   Edward Cummings is a 89 y.o. y/o male has been referred for dysphagia.  EGD in 2021 demonstrated lymphocytic esophagitis he did not follow-up subsequently.  Presents with dysphagia presently.   Plan 1.  EGD with biopsies of the esophagus to rule out lymphocytic esophagitis with first available MD as he says his swallowing is extremely hard at this point 2.  If EGD is negative then we will proceed with further evaluation with barium swallow with tablet and possibly modified barium swallow to evaluate upper GI tract particularly rule out a cricopharyngeal bar which was suspected previously on EGD   I have discussed alternative options, risks & benefits,  which include, but are not limited to, bleeding, infection, perforation,respiratory complication & drug reaction.  The patient agrees with this plan & written consent will be obtained.    Follow up in 8 weeks with Inetta Fermo  Dr [name] MD,MRCP(U.K)

## 2023-03-08 ENCOUNTER — Ambulatory Visit: Payer: Medicare HMO | Admitting: Anesthesiology

## 2023-03-08 ENCOUNTER — Encounter
Admission: RE | Disposition: A | Discharge status: Home / Self Care | Payer: Self-pay | Source: Home / Self Care | Attending: Gastroenterology

## 2023-03-08 ENCOUNTER — Encounter: Payer: Self-pay | Admitting: Gastroenterology

## 2023-03-08 ENCOUNTER — Ambulatory Visit
Admission: RE | Admit: 2023-03-08 | Discharge: 2023-03-08 | Disposition: A | Discharge status: Home / Self Care | Payer: Medicare HMO | Attending: Gastroenterology | Admitting: Gastroenterology

## 2023-03-08 DIAGNOSIS — Z87891 Personal history of nicotine dependence: Secondary | ICD-10-CM | POA: Insufficient documentation

## 2023-03-08 DIAGNOSIS — R1310 Dysphagia, unspecified: Secondary | ICD-10-CM | POA: Diagnosis not present

## 2023-03-08 DIAGNOSIS — K449 Diaphragmatic hernia without obstruction or gangrene: Secondary | ICD-10-CM | POA: Diagnosis not present

## 2023-03-08 DIAGNOSIS — I129 Hypertensive chronic kidney disease with stage 1 through stage 4 chronic kidney disease, or unspecified chronic kidney disease: Secondary | ICD-10-CM | POA: Insufficient documentation

## 2023-03-08 DIAGNOSIS — D631 Anemia in chronic kidney disease: Secondary | ICD-10-CM | POA: Diagnosis not present

## 2023-03-08 DIAGNOSIS — Z7982 Long term (current) use of aspirin: Secondary | ICD-10-CM | POA: Insufficient documentation

## 2023-03-08 DIAGNOSIS — R1319 Other dysphagia: Secondary | ICD-10-CM

## 2023-03-08 DIAGNOSIS — N189 Chronic kidney disease, unspecified: Secondary | ICD-10-CM | POA: Insufficient documentation

## 2023-03-08 DIAGNOSIS — K3189 Other diseases of stomach and duodenum: Secondary | ICD-10-CM | POA: Diagnosis not present

## 2023-03-08 DIAGNOSIS — Q403 Congenital malformation of stomach, unspecified: Secondary | ICD-10-CM | POA: Diagnosis not present

## 2023-03-08 DIAGNOSIS — K224 Dyskinesia of esophagus: Secondary | ICD-10-CM | POA: Insufficient documentation

## 2023-03-08 HISTORY — PX: BIOPSY: SHX5522

## 2023-03-08 HISTORY — PX: ESOPHAGOGASTRODUODENOSCOPY (EGD) WITH PROPOFOL: SHX5813

## 2023-03-08 SURGERY — ESOPHAGOGASTRODUODENOSCOPY (EGD) WITH PROPOFOL
Anesthesia: General

## 2023-03-08 MED ORDER — EPHEDRINE SULFATE-NACL 50-0.9 MG/10ML-% IV SOSY
PREFILLED_SYRINGE | INTRAVENOUS | Status: DC | PRN
Start: 2023-03-08 — End: 2023-03-08
  Administered 2023-03-08: 5 mg via INTRAVENOUS

## 2023-03-08 MED ORDER — PROPOFOL 10 MG/ML IV BOLUS
INTRAVENOUS | Status: DC | PRN
  Administered 2023-03-08 (×3): 20 mg via INTRAVENOUS
  Administered 2023-03-08: 80 mg via INTRAVENOUS

## 2023-03-08 MED ORDER — SODIUM CHLORIDE 0.9 % IV SOLN: INTRAVENOUS | Status: DC

## 2023-03-08 NOTE — Op Note (Signed)
[facility] Gastroenterology Patient Name: Edward Cummings Procedure Date: 03/08/2023 10:57 AM MRN: [id number] Account #: [id number] Date of Birth: 16-Mar-1933 Admit Type: Outpatient Age: 89 Room: [facility] ENDO ROOM 3 Gender: Male Note Status: Finalized Instrument Name: Laurette Schimke [id number] Procedure:             Upper GI endoscopy Indications:           Dysphagia Providers:             [name] MD, MD Referring MD:          Lorre Munroe (Referring MD) Medicines:             Monitored Anesthesia Care Complications:         No immediate complications. Procedure:             Pre-Anesthesia Assessment:                        - Prior to the procedure, a History and Physical was                         performed, and patient medications, allergies and                         sensitivities were reviewed. The patient's tolerance                         of previous anesthesia was reviewed.                        - The risks and benefits of the procedure and the                         sedation options and risks were discussed with the                         patient. All questions were answered and informed                         consent was obtained.                        - ASA Grade Assessment: II - A patient with mild                         systemic disease.                        After obtaining informed consent, the endoscope was                         passed under direct vision. Throughout the procedure,                         the patient's blood pressure, pulse, and oxygen                         saturations were monitored continuously. The Endoscope                         was introduced through the  mouth, and advanced to the                         third part of duodenum. The upper GI endoscopy was                         accomplished with ease. The patient tolerated the                         procedure well. Findings:      Abnormal motility was noted in  the esophagus. The cricopharyngeus was       abnormal. There is a decrease in motility of the esophageal body.      A large hiatal hernia was present.      A deformity was found in the gastric body. likely due to looping from       large hernia      The examined duodenum was normal.      Normal mucosa was found in the entire esophagus. Biopsies were taken       with a cold forceps for histology. Impression:            - Abnormal esophageal motility.                        - Large hiatal hernia.                        - Deformity in the gastric body.                        - Normal examined duodenum.                        - Normal mucosa was found in the entire esophagus.                         Biopsied. Recommendation:        - Discharge patient to home (with escort).                        - Resume previous diet.                        - Continue present medications.                        - Await pathology results.                        - Suggest upper gi series to determine nature of hiatl                         hernia Procedure Code(s):     --- Professional ---                        [phone], Esophagogastroduodenoscopy, flexible,                         transoral; with biopsy, single or multiple Diagnosis Code(s):     --- Professional ---  K22.4, Dyskinesia of esophagus                        K44.9, Diaphragmatic hernia without obstruction or                         gangrene                        K31.89, Other diseases of stomach and duodenum                        R13.10, Dysphagia, unspecified CPT copyright 2022 American Medical Association. All rights reserved. The codes documented in this report are preliminary and upon coder review may  be revised to meet current compliance requirements. [name], MD [name] MD, MD 03/08/2023 11:10:19 AM This report has been signed electronically. Number of Addenda: 0 Note Initiated On: 03/08/2023 10:57  AM Estimated Blood Loss:  Estimated blood loss: none.      [facility]

## 2023-03-08 NOTE — Transfer of Care (Signed)
Immediate Anesthesia Transfer of Care Note  Patient: Edward Cummings  Procedure(s) Performed: ESOPHAGOGASTRODUODENOSCOPY (EGD) WITH PROPOFOL BIOPSY  Patient Location: PACU and Endoscopy Unit  Anesthesia Type:General  Level of Consciousness: drowsy and patient cooperative  Airway & Oxygen Therapy: Patient Spontanous Breathing  Post-op Assessment: Report given to RN and Post -op Vital signs reviewed and stable  Post vital signs: Reviewed and stable  Last Vitals:  Vitals Value Taken Time  BP 94/58 03/08/23 1114  Temp 35.8 C 03/08/23 1110  Pulse 74 03/08/23 1117  Resp 16 03/08/23 1117  SpO2 100 % 03/08/23 1117  Vitals shown include unfiled device data.  Last Pain:  Vitals:   03/08/23 1110  TempSrc: Temporal  PainSc: Asleep         Complications: No notable events documented.

## 2023-03-08 NOTE — H&P (Signed)
[name], MD [address], [place], [place], [address] [address], [place], [place], [address] Phone: [phone]  Fax: [phone]  Primary Care Physician:  Lorre Munroe, NP   Pre-Procedure History & Physical: HPI:  Edward Cummings is a 89 y.o. male is here for an endoscopy    Past Medical History:  Diagnosis Date   Anemia    Anxiety 12/31/2019   Cancer [facility])    bladder   Chronic kidney disease    Depression    Hypertension    Neuromuscular disorder (HCC)    Nerve damage to left face/eye since around 2002.    Past Surgical History:  Procedure Laterality Date   CYSTOSCOPY W/ RETROGRADES Bilateral 01/25/2018   Procedure: CYSTOSCOPY WITH RETROGRADE PYELOGRAM;  Surgeon: Riki Altes, MD;  [name];  Service: Urology;  Laterality: Bilateral;   CYSTOSCOPY W/ URETERAL STENT PLACEMENT Bilateral 01/06/2019   Procedure: CYSTOSCOPY WITH RETROGRADE PYELOGRAM/URETERAL STENT REMOVAL;  Surgeon: Riki Altes, MD;  [name];  Service: Urology;  Laterality: Bilateral;   CYSTOSCOPY WITH BIOPSY N/A 09/21/2020   Procedure: CYSTOSCOPY WITH BLADDER BIOPSY;  Surgeon: Riki Altes, MD;  [name];  Service: Urology;  [name];   CYSTOSCOPY WITH FULGERATION N/A 09/21/2020   Procedure: CYSTOSCOPY WITH FULGERATION;  Surgeon: Riki Altes, MD;  [name];  Service: Urology;  [name];   CYSTOSCOPY WITH STENT PLACEMENT Bilateral 01/25/2018   Procedure: CYSTOSCOPY WITH STENT PLACEMENT;  Surgeon: Riki Altes, MD;  [name];  Service: Urology;  Laterality: Bilateral;   DORSAL SLIT N/A 01/06/2019   Procedure: DORSAL SLIT;  Surgeon: Riki Altes, MD;  [name];  Service: Urology;  [name];   ESOPHAGOGASTRODUODENOSCOPY (EGD) WITH PROPOFOL N/A 11/12/2019   Procedure: ESOPHAGOGASTRODUODENOSCOPY (EGD) WITH PROPOFOL;  Surgeon: Pasty Spillers, MD;  Location: [facility] ENDOSCOPY;   Service: Endoscopy;  [name];   EYE SURGERY     Cornea transplants bilaterally & cataract surgery.   TRANSURETHRAL RESECTION OF BLADDER TUMOR N/A 01/25/2018   Procedure: TRANSURETHRAL RESECTION OF BLADDER TUMOR (TURBT);  Surgeon: Riki Altes, MD;  [name];  Service: Urology;  [name];    Prior to Admission medications   Medication Sig Start Date End Date Taking? Authorizing Provider  amLODipine (NORVASC) 10 MG tablet TAKE ONE TABLET BY MOUTH ONCE DAILY 08/25/22  Yes Kara Dies, NP  aspirin EC 81 MG tablet Take 81 mg by mouth daily.   Yes [provider]  atorvastatin (LIPITOR) 10 MG tablet Take 1 tablet (10 mg total) by mouth daily. 12/01/22  Yes Lorre Munroe, NP  levothyroxine (SYNTHROID) 75 MCG tablet TAKE ONE TABLET BY MOUTH EVERY MORNING BEFORE BREAKFAST 12/07/22  Yes Earna Coder, MD  loratadine (CLARITIN) 10 MG tablet TAKE 1 TABLET BY MOUTH ONCE A DAY 12/26/21  Yes Masoud, Renda Rolls, MD  omeprazole (PRILOSEC) 40 MG capsule Take 1 capsule (40 mg total) by mouth daily. 01/01/23  Yes Baity, Salvadore Oxford, NP  Ipratropium-Albuterol (COMBIVENT RESPIMAT) 20-100 MCG/ACT AERS respimat Inhale 1 puff into the lungs every 6 (six) hours as needed for wheezing or shortness of breath. 10/19/22   Drema Dallas, MD  mometasone (NASONEX) 50 MCG/ACT nasal spray PLACE TWO SPRAYS INTO THE NOSE DAILY 12/08/22   Lorre Munroe, NP  Multiple Vitamin (MULTIVITAMIN WITH MINERALS) TABS tablet Take 1 tablet by mouth daily.     [provider]  ondansetron (ZOFRAN) 4 MG tablet Take 1 tablet (4 mg total) by mouth daily as needed for nausea or vomiting. 10/19/22 10/19/23  Drema Dallas, MD  prednisoLONE acetate (PRED FORTE) 1 % ophthalmic suspension Place 1 drop into both eyes daily at 2 PM. 10/28/21   Corky Downs, MD  predniSONE (DELTASONE) 5 MG tablet TAKE ONE TABLET BY MOUTH ONCE A DAY WITH BREAKFAST. 11/07/22   Earna Coder, MD    Allergies as of  03/05/2023   (No Known Allergies)    Family History  Problem Relation Age of Onset   Prostate cancer Neg Hx    Kidney cancer Neg Hx    Bladder Cancer Neg Hx    Cancer - Prostate Neg Hx     Social History   Socioeconomic History   Marital status: Widowed    Spouse name: Talbert Forest   Number of children: 2   Years of education: 6th grade   Highest education level: 6th grade  Occupational History   Not on file  Tobacco Use   Smoking status: Former    Current packs/day: 0.00    Types: Cigarettes    Quit date: 07-Apr-2009    Years since quitting: 14.7   Smokeless tobacco: Former    Types: Chew   Tobacco comments:    Stopped approximately 10 years ago.  Vaping Use   Vaping status: Never Used  Substance and Sexual Activity   Alcohol use: Yes    Alcohol/week: 2.0 standard drinks of alcohol    Types: 2 Cans of beer per week    Comment: occasionally   Drug use: Never   Sexual activity: Not Currently    Birth control/protection: None  Other Topics Concern   Not on file  Social History Narrative    lives in [place]; with oldest son. Wife died in 2019/04/08.  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 Pt still drives and is indepent with no serious chronic disease. Worked in [employer]    Social Determinants of Health   Financial Resource Strain: Low Risk  (02/08/2023)   Overall Financial Resource Strain (CARDIA)    Difficulty of Paying Living Expenses: Not hard at all  Food Insecurity: No Food Insecurity (02/08/2023)   Hunger Vital Sign    Worried About Running Out of Food in the Last Year: Never true    Ran Out of Food in the Last Year: Never true  Transportation Needs: No Transportation Needs (02/08/2023)   PRAPARE - Administrator, Civil Service (Medical): No    Lack of Transportation (Non-Medical): No  Physical Activity: Insufficiently Active (02/08/2023)   Exercise Vital Sign    Days of Exercise per Week: 3 days     Minutes of Exercise per Session: 30 min  Stress: No Stress Concern Present (02/08/2023)   [employer] of Occupational Health - Occupational Stress Questionnaire    Feeling of Stress : Only a little  Social Connections: Socially Isolated (02/08/2023)   Social Connection and Isolation Panel [NHANES]    Frequency of Communication with Friends and Family: More than three times a week    Frequency of Social Gatherings with Friends and Family: More than three times a week    Attends Religious Services: Never    Database administrator or Organizations: No    Attends Banker Meetings: Never    Marital Status: Widowed  Intimate Partner Violence: Not At Risk (02/08/2023)   Humiliation,  Afraid, Rape, and Kick questionnaire    Fear of Current or Ex-Partner: No    Emotionally Abused: No    Physically Abused: No    Sexually Abused: No    Review of Systems: See HPI, otherwise negative ROS  Physical Exam: BP (!) 153/73   Pulse 63   Temp (!) 96.3 F (35.7 C) (Temporal)   Resp 16   Wt 56.7 kg   SpO2 100%   BMI 20.18 kg/m  General:   Alert,  pleasant and cooperative in NAD Head:  Normocephalic and atraumatic. Neck:  Supple; no masses or thyromegaly. Lungs:  Clear throughout to auscultation, normal respiratory effort.    Heart:  +S1, +S2, Regular rate and rhythm, No edema. Abdomen:  Soft, nontender and nondistended. Normal bowel sounds, without guarding, and without rebound.   Neurologic:  Alert and  oriented x4;  grossly normal neurologically.  Impression/Plan: Edward Cummings is here for an endoscopy  to be performed for  evaluation of dysphagia    Risks, benefits, limitations, and alternatives regarding endoscopy have been reviewed with the patient.  Questions have been answered.  All parties agreeable.   [name], MD  03/08/2023, 10:10 AM

## 2023-03-08 NOTE — Anesthesia Preprocedure Evaluation (Signed)
Anesthesia Evaluation  Patient identified by MRN, date of birth, ID band Patient awake    Reviewed: Allergy & Precautions, NPO status , Patient's Chart, lab work & pertinent test results  Airway Mallampati: II  TM Distance: >3 FB Neck ROM: full    Dental  (+) Edentulous Upper, Edentulous Lower   Pulmonary neg pulmonary ROS, COPD, Patient abstained from smoking., former smoker   Pulmonary exam normal  + decreased breath sounds      Cardiovascular Exercise Tolerance: Good hypertension, Pt. on medications negative cardio ROS Normal cardiovascular exam Rhythm:Regular Rate:Normal     Neuro/Psych    Depression    negative neurological ROS  negative psych ROS   GI/Hepatic negative GI ROS, Neg liver ROS,GERD  Medicated,,  Endo/Other  negative endocrine ROSHypothyroidism    Renal/GU CRFRenal diseasenegative Renal ROS  negative genitourinary   Musculoskeletal   Abdominal Normal abdominal exam  (+)   Peds negative pediatric ROS (+)  Hematology negative hematology ROS (+)   Anesthesia Other Findings Past Medical History: No date: Anemia 12/31/2019: Anxiety No date: Cancer [facility])     Comment:  bladder No date: Chronic kidney disease No date: Depression No date: Hypertension No date: Neuromuscular disorder (HCC)     Comment:  Nerve damage to left face/eye since around 2002.  Past Surgical History: 01/25/2018: CYSTOSCOPY W/ RETROGRADES; Bilateral     Comment:  Procedure: CYSTOSCOPY WITH RETROGRADE PYELOGRAM;                Surgeon: Riki Altes, MD;  [name];                Service: Urology;  Laterality: Bilateral; 01/06/2019: CYSTOSCOPY W/ URETERAL STENT PLACEMENT; Bilateral     Comment:  Procedure: CYSTOSCOPY WITH RETROGRADE PYELOGRAM/URETERAL              STENT REMOVAL;  Surgeon: Riki Altes, MD;  Location:              [facility] ORS;  Service: Urology;  Laterality: Bilateral; 09/21/2020: CYSTOSCOPY WITH  BIOPSY; N/A     Comment:  Procedure: CYSTOSCOPY WITH BLADDER BIOPSY;  Surgeon:               Riki Altes, MD;  [name];  Service:               Urology;  [name]; 09/21/2020: CYSTOSCOPY WITH FULGERATION; N/A     Comment:  Procedure: CYSTOSCOPY WITH FULGERATION;  Surgeon:               Riki Altes, MD;  [name];  Service:               Urology;  [name]; 01/25/2018: CYSTOSCOPY WITH STENT PLACEMENT; Bilateral     Comment:  Procedure: CYSTOSCOPY WITH STENT PLACEMENT;  Surgeon:               Riki Altes, MD;  [name];  Service:               Urology;  Laterality: Bilateral; 01/06/2019: DORSAL SLIT; N/A     Comment:  Procedure: DORSAL SLIT;  Surgeon: Riki Altes, MD;               [name];  Service: Urology;  [name]; 11/12/2019: ESOPHAGOGASTRODUODENOSCOPY (EGD) WITH PROPOFOL; N/A     Comment:  Procedure: ESOPHAGOGASTRODUODENOSCOPY (EGD) WITH  PROPOFOL;  Surgeon: Pasty Spillers, MD;  Location:               [facility] ENDOSCOPY;  Service: Endoscopy;  [name]; No date: EYE SURGERY     Comment:  Cornea transplants bilaterally & cataract surgery. 01/25/2018: TRANSURETHRAL RESECTION OF BLADDER TUMOR; N/A     Comment:  Procedure: TRANSURETHRAL RESECTION OF BLADDER TUMOR               (TURBT);  Surgeon: Riki Altes, MD;  Location: [facility]               ORS;  Service: Urology;  [name];  BMI    Body Mass Index: 20.18 kg/m      Reproductive/Obstetrics negative OB ROS                             Anesthesia Physical Anesthesia Plan  ASA: 3  Anesthesia Plan: General   Post-op Pain Management:    Induction: Intravenous  PONV Risk Score and Plan: Propofol infusion and TIVA  Airway Management Planned: Natural Airway  Additional Equipment:   Intra-op Plan:   Post-operative Plan:   Informed Consent: I have reviewed the patients History and  Physical, chart, labs and discussed the procedure including the risks, benefits and alternatives for the proposed anesthesia with the patient or authorized representative who has indicated his/her understanding and acceptance.     Dental Advisory Given  Plan Discussed with: CRNA and Surgeon  Anesthesia Plan Comments:        Anesthesia Quick Evaluation

## 2023-03-08 NOTE — Anesthesia Postprocedure Evaluation (Signed)
Anesthesia Post Note  Patient: Edward Cummings  Procedure(s) Performed: ESOPHAGOGASTRODUODENOSCOPY (EGD) WITH PROPOFOL BIOPSY  Anesthesia Type: General Level of consciousness: awake Pain management: satisfactory to patient Vital Signs Assessment: post-procedure vital signs reviewed and stable Respiratory status: spontaneous breathing Cardiovascular status: blood pressure returned to baseline Anesthetic complications: no   No notable events documented.   Last Vitals:  Vitals:   03/08/23 1120 03/08/23 1130  BP: 99/67 133/64  Pulse:    Resp:    Temp:    SpO2:      Last Pain:  Vitals:   03/08/23 1130  TempSrc:   PainSc: 0-No pain                 VAN STAVEREN,Kemauri Musa

## 2023-03-12 ENCOUNTER — Other Ambulatory Visit: Payer: Self-pay | Admitting: Internal Medicine

## 2023-03-12 ENCOUNTER — Telehealth: Payer: Self-pay

## 2023-03-12 ENCOUNTER — Encounter: Payer: Self-pay | Admitting: Gastroenterology

## 2023-03-12 DIAGNOSIS — R1319 Other dysphagia: Secondary | ICD-10-CM

## 2023-03-12 DIAGNOSIS — K449 Diaphragmatic hernia without obstruction or gangrene: Secondary | ICD-10-CM

## 2023-03-12 LAB — SURGICAL PATHOLOGY

## 2023-03-12 NOTE — Telephone Encounter (Signed)
Patient's son Edward Cummings called wanting his father's EGD results. He wanted to know if he had any type of cancer and what could be done to get his hiatal hernia repaired.  Dr. Tobi Bastos, you had requested to follow up with Edward Cummings in 2 months from his last visit (03/05/2023) with Edward Cummings but it was never scheduled. Do I schedule an appointment with you or with Edward Cummings? Please advise.

## 2023-03-13 ENCOUNTER — Other Ambulatory Visit: Payer: Self-pay | Admitting: *Deleted

## 2023-03-13 DIAGNOSIS — D649 Anemia, unspecified: Secondary | ICD-10-CM

## 2023-03-13 NOTE — Telephone Encounter (Signed)
Called Edward Cummings to let him know what Dr. Tobi Bastos wanted me to tell him about his father. I let [name] know that his dad had his upper GI series scheduled and that it would show up 03/19/2023 at 9:45 AM at the [facility] and that he had to : nothing to eat or drink after midnight the night before his UGI. [name] understood and had no further question,

## 2023-03-14 ENCOUNTER — Inpatient Hospital Stay: Payer: Medicare HMO | Attending: Internal Medicine

## 2023-03-14 ENCOUNTER — Inpatient Hospital Stay: Payer: Medicare HMO

## 2023-03-14 ENCOUNTER — Inpatient Hospital Stay (HOSPITAL_BASED_OUTPATIENT_CLINIC_OR_DEPARTMENT_OTHER): Payer: Medicare HMO | Admitting: Internal Medicine

## 2023-03-14 ENCOUNTER — Encounter: Payer: Self-pay | Admitting: Internal Medicine

## 2023-03-14 VITALS — BP 137/61 | HR 72 | Temp 96.9°F | Ht 66.0 in | Wt 129.2 lb

## 2023-03-14 DIAGNOSIS — Z87891 Personal history of nicotine dependence: Secondary | ICD-10-CM | POA: Diagnosis not present

## 2023-03-14 DIAGNOSIS — D649 Anemia, unspecified: Secondary | ICD-10-CM

## 2023-03-14 DIAGNOSIS — I129 Hypertensive chronic kidney disease with stage 1 through stage 4 chronic kidney disease, or unspecified chronic kidney disease: Secondary | ICD-10-CM | POA: Diagnosis not present

## 2023-03-14 DIAGNOSIS — Z8551 Personal history of malignant neoplasm of bladder: Secondary | ICD-10-CM

## 2023-03-14 DIAGNOSIS — Z79899 Other long term (current) drug therapy: Secondary | ICD-10-CM | POA: Insufficient documentation

## 2023-03-14 DIAGNOSIS — Z9221 Personal history of antineoplastic chemotherapy: Secondary | ICD-10-CM | POA: Diagnosis not present

## 2023-03-14 DIAGNOSIS — D631 Anemia in chronic kidney disease: Secondary | ICD-10-CM | POA: Insufficient documentation

## 2023-03-14 DIAGNOSIS — N183 Chronic kidney disease, stage 3 unspecified: Secondary | ICD-10-CM | POA: Insufficient documentation

## 2023-03-14 DIAGNOSIS — C678 Malignant neoplasm of overlapping sites of bladder: Secondary | ICD-10-CM

## 2023-03-14 LAB — CMP (CANCER CENTER ONLY)
ALT: 17 U/L (ref 0–44)
AST: 19 U/L (ref 15–41)
Albumin: 4 g/dL (ref 3.5–5.0)
Alkaline Phosphatase: 55 U/L (ref 38–126)
Anion gap: 8 (ref 5–15)
BUN: 29 mg/dL — ABNORMAL HIGH (ref 8–23)
CO2: 24 mmol/L (ref 22–32)
Calcium: 9.1 mg/dL (ref 8.9–10.3)
Chloride: 104 mmol/L (ref 98–111)
Creatinine: 2.25 mg/dL — ABNORMAL HIGH (ref 0.61–1.24)
GFR, Estimated: 27 mL/min — ABNORMAL LOW (ref >60)
Glucose, Bld: 92 mg/dL (ref 70–99)
Potassium: 4.2 mmol/L (ref 3.5–5.1)
Sodium: 136 mmol/L (ref 135–145)
Total Bilirubin: 0.6 mg/dL (ref 0.3–1.2)
Total Protein: 6.8 g/dL (ref 6.5–8.1)

## 2023-03-14 LAB — CBC WITH DIFFERENTIAL (CANCER CENTER ONLY)
Abs Immature Granulocytes: 0.06 10*3/uL (ref 0.00–0.07)
Basophils Absolute: 0.1 10*3/uL (ref 0.0–0.1)
Basophils Relative: 1 %
Eosinophils Absolute: 0.6 10*3/uL — ABNORMAL HIGH (ref 0.0–0.5)
Eosinophils Relative: 5 %
HCT: 34.6 % — ABNORMAL LOW (ref 39.0–52.0)
Hemoglobin: 11.1 g/dL — ABNORMAL LOW (ref 13.0–17.0)
Immature Granulocytes: 1 %
Lymphocytes Relative: 13 %
Lymphs Abs: 1.6 10*3/uL (ref 0.7–4.0)
MCH: 31.7 pg (ref 26.0–34.0)
MCHC: 32.1 g/dL (ref 30.0–36.0)
MCV: 98.9 fL (ref 80.0–100.0)
Monocytes Absolute: 0.8 10*3/uL (ref 0.1–1.0)
Monocytes Relative: 7 %
Neutro Abs: 9 10*3/uL — ABNORMAL HIGH (ref 1.7–7.7)
Neutrophils Relative %: 73 %
Platelet Count: 232 10*3/uL (ref 150–400)
RBC: 3.5 MIL/uL — ABNORMAL LOW (ref 4.22–5.81)
RDW: 13 % (ref 11.5–15.5)
WBC Count: 12.1 10*3/uL — ABNORMAL HIGH (ref 4.0–10.5)
nRBC: 0 % (ref 0.0–0.2)

## 2023-03-14 LAB — IRON AND TIBC
Iron: 52 ug/dL (ref 45–182)
Saturation Ratios: 19 % (ref 17.9–39.5)
TIBC: 272 ug/dL (ref 250–450)
UIBC: 220 ug/dL

## 2023-03-14 LAB — FERRITIN: Ferritin: 396 ng/mL — ABNORMAL HIGH (ref 24–336)

## 2023-03-14 NOTE — Progress Notes (Signed)
[facility] CONSULT NOTE  Patient Care Team: Lorre Munroe, NP as PCP - General (Internal Medicine) Earna Coder, MD as Consulting Physician (Hematology and Oncology) Pasty Spillers, MD (Inactive) as Consulting Physician (Gastroenterology) Geanie Logan, MD as Referring Physician (Otolaryngology) Vida Rigger, MD as Consulting Physician (Pulmonary Disease) Riki Altes, MD (Urology)  CHIEF COMPLAINTS/PURPOSE OF CONSULTATION: Bladder cancer   Oncology History Overview Note  # AUG 2019-TRANSITIONAL CELL BLADDER CA [~ 4cm tumor] s/p cystoscopy [Dr.Stoiff]  with extensive angiolymphatic invasion; [name]; [name]; mediastinal uptake; right pubic rami uptake.  # [id number] Neas; Jan 18th 2021- switched to Opdivo- [pt preference; [name]; July 2020 cystoscopy-no evidence of bladder malignancy/Dr. Lonna Cobb- enlarged prostate [PSA- 0.95; 2021]  # Molecular testing- PDL-1 CPS- 20%; [name]  ;GOALS: palliative  CURRENT/MOST RECENT THERAPY:OPDIVO [C]     History of bladder cancer  02/28/2018 - 06/09/2019 Chemotherapy   The patient had atezolizumab (TECENTRIQ) 1,[name], 1,200 mg, Intravenous, Once, [name],200 mg (02/28/2018), 1,200 mg (03/21/2018), 1,200 mg (04/11/2018), 1,200 mg (05/02/2018), 1,200 mg (06/13/2018), 1,200 mg (05/23/2018), 1,200 mg (07/04/2018), 1,200 mg (07/25/2018), 1,200 mg (08/15/2018), 1,200 mg (09/05/2018), 1,200 mg (10/25/2018), 1,200 mg (11/22/2018), 1,200 mg (12/20/2018), 1,200 mg (01/10/2019), 1,200 mg (01/31/2019), 1,200 mg (02/21/2019), 1,200 mg (03/14/2019), 1,200 mg (04/04/2019), 1,200 mg  (04/25/2019), 1,200 mg (05/16/2019), 1,200 mg (06/09/2019)  for chemotherapy treatment.    06/30/2019 - 06/17/2020 Chemotherapy   Patient is on Treatment Plan : BLADDER Nivolumab q14d      HISTORY OF PRESENTING ILLNESS: Patient ambulating independently.  Accompanied by his son.   Minna Merritts 89 y.o.  male with metastatic transitional carcinoma of the bladder most recently on Opdivo [currently on hold because of poor tolerance]; currently on surveillance is here for follow-up.  S/p upper GI endoscopy re: dysphagia.  Chronic dyspnea with exertion. Chronic low back pain. Appetite is good. No visible blood in stools. Appetite is good.  No weight loss.  No nausea no vomiting. Continues to be on prednisone 5 mg a day.   Review of Systems  Constitutional:  Positive for malaise/fatigue. Negative for chills, diaphoresis and fever.  HENT:  Negative for nosebleeds and sore throat.   Eyes:  Negative for double vision.  Respiratory:  Negative for hemoptysis, sputum production and wheezing.   Cardiovascular:  Negative for chest pain, palpitations, orthopnea and leg swelling.  Gastrointestinal:  Negative for abdominal pain, blood in stool, diarrhea, heartburn, melena, nausea and vomiting.  Genitourinary:  Negative for dysuria.  Musculoskeletal:  Positive for back pain and joint pain.  Skin: Negative.  Negative for itching and rash.  Neurological:  Negative for tingling, focal weakness and headaches.  Endo/Heme/Allergies:  Does not bruise/bleed easily.  Psychiatric/Behavioral:  Negative for depression. The patient is not nervous/anxious and does not have insomnia.      MEDICAL HISTORY:  Past Medical History:  Diagnosis Date   Anemia    Anxiety 12/31/2019   Cancer [facility])    bladder   Chronic kidney disease    Depression    Hypertension    Neuromuscular disorder (HCC)    Nerve damage to left  face/eye since around April 05, 2001.    SURGICAL HISTORY: Past Surgical History:  Procedure Laterality Date    BIOPSY  03/08/2023   Procedure: BIOPSY;  Surgeon: [name], MD;  Location: [facility] ENDOSCOPY;  Service: Gastroenterology;;   Bluford Kaufmann W/ RETROGRADES Bilateral 01/25/2018   Procedure: CYSTOSCOPY WITH RETROGRADE PYELOGRAM;  Surgeon: Riki Altes, MD;  [name];  Service: Urology;  Laterality: Bilateral;   CYSTOSCOPY W/ URETERAL STENT PLACEMENT Bilateral 01/06/2019   Procedure: CYSTOSCOPY WITH RETROGRADE PYELOGRAM/URETERAL STENT REMOVAL;  Surgeon: Riki Altes, MD;  [name];  Service: Urology;  Laterality: Bilateral;   CYSTOSCOPY WITH BIOPSY N/A 09/21/2020   Procedure: CYSTOSCOPY WITH BLADDER BIOPSY;  Surgeon: Riki Altes, MD;  [name];  Service: Urology;  [name];   CYSTOSCOPY WITH FULGERATION N/A 09/21/2020   Procedure: CYSTOSCOPY WITH FULGERATION;  Surgeon: Riki Altes, MD;  [name];  Service: Urology;  [name];   CYSTOSCOPY WITH STENT PLACEMENT Bilateral 01/25/2018   Procedure: CYSTOSCOPY WITH STENT PLACEMENT;  Surgeon: Riki Altes, MD;  [name];  Service: Urology;  Laterality: Bilateral;   DORSAL SLIT N/A 01/06/2019   Procedure: DORSAL SLIT;  Surgeon: Riki Altes, MD;  [name];  Service: Urology;  [name];   ESOPHAGOGASTRODUODENOSCOPY (EGD) WITH PROPOFOL N/A 11/12/2019   Procedure: ESOPHAGOGASTRODUODENOSCOPY (EGD) WITH PROPOFOL;  Surgeon: Pasty Spillers, MD;  Location: [facility] ENDOSCOPY;  Service: Endoscopy;  [name];   ESOPHAGOGASTRODUODENOSCOPY (EGD) WITH PROPOFOL N/A 03/08/2023   Procedure: ESOPHAGOGASTRODUODENOSCOPY (EGD) WITH PROPOFOL;  Surgeon: [name], MD;  Location: [facility] ENDOSCOPY;  Service: Gastroenterology;  [name];   EYE SURGERY     Cornea transplants bilaterally & cataract surgery.   TRANSURETHRAL RESECTION OF BLADDER TUMOR N/A 01/25/2018   Procedure: TRANSURETHRAL RESECTION OF BLADDER TUMOR (TURBT);  Surgeon: Riki Altes, MD;  Location: [facility]  ORS;  Service: Urology;  [name];    SOCIAL HISTORY: Social History   Socioeconomic History   Marital status: Widowed    Spouse name: Talbert Forest   Number of children: 2   Years of education: 6th grade   Highest education level: 6th grade  Occupational History   Not on file  Tobacco Use   Smoking status: Former    Current packs/day: 0.00    Types: Cigarettes    Quit date: 05-Apr-2009    Years since quitting: 14.7   Smokeless tobacco: Former    Types: Chew   Tobacco comments:    Stopped approximately 10 years ago.  Vaping Use   Vaping status: Never Used  Substance and Sexual Activity   Alcohol use: Yes    Alcohol/week: 2.0 standard drinks of alcohol    Types: 2 Cans of beer per week    Comment: occasionally   Drug use: Never   Sexual activity: Not Currently    Birth control/protection: None  Other Topics Concern   Not on file  Social History Narrative    lives in [place]; with oldest son. Wife died in April 06, 2019.  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 Pt still drives and is indepent with no serious chronic disease. Worked in [employer]    Social Determinants of Health   Financial Resource Strain: Low Risk  (02/08/2023)   Overall Financial Resource Strain (CARDIA)    Difficulty of Paying Living Expenses: Not hard at all  Food Insecurity: No Food Insecurity (02/08/2023)   Hunger Vital Sign  Worried About Programme researcher, broadcasting/film/video in the Last Year: Never true    Ran Out of Food in the Last Year: Never true  Transportation Needs: No Transportation Needs (02/08/2023)   PRAPARE - Administrator, Civil Service (Medical): No    Lack of Transportation (Non-Medical): No  Physical Activity: Insufficiently Active (02/08/2023)   Exercise Vital Sign    Days of Exercise per Week: 3 days    Minutes of Exercise per Session: 30 min  Stress: No Stress Concern Present (02/08/2023)   [employer] of Occupational Health -  Occupational Stress Questionnaire    Feeling of Stress : Only a little  Social Connections: Socially Isolated (02/08/2023)   Social Connection and Isolation Panel [NHANES]    Frequency of Communication with Friends and Family: More than three times a week    Frequency of Social Gatherings with Friends and Family: More than three times a week    Attends Religious Services: Never    Database administrator or Organizations: No    Attends Banker Meetings: Never    Marital Status: Widowed  Intimate Partner Violence: Not At Risk (02/08/2023)   Humiliation, Afraid, Rape, and Kick questionnaire    Fear of Current or Ex-Partner: No    Emotionally Abused: No    Physically Abused: No    Sexually Abused: No    FAMILY HISTORY: Family History  Problem Relation Age of Onset   Prostate cancer Neg Hx    Kidney cancer Neg Hx    Bladder Cancer Neg Hx    Cancer - Prostate Neg Hx     ALLERGIES:  has No Known Allergies.  MEDICATIONS:  Current Outpatient Medications  Medication Sig Dispense Refill   amLODipine (NORVASC) 10 MG tablet TAKE ONE TABLET BY MOUTH ONCE DAILY 90 tablet 1   aspirin EC 81 MG tablet Take 81 mg by mouth daily.     atorvastatin (LIPITOR) 10 MG tablet Take 1 tablet (10 mg total) by mouth daily. 90 tablet 1   Ipratropium-Albuterol (COMBIVENT RESPIMAT) 20-100 MCG/ACT AERS respimat Inhale 1 puff into the lungs every 6 (six) hours as needed for wheezing or shortness of breath. 4 g 0   levothyroxine (SYNTHROID) 75 MCG tablet TAKE ONE TABLET BY MOUTH EVERY MORNING BEFORE BREAKFAST 30 tablet 3   loratadine (CLARITIN) 10 MG tablet TAKE 1 TABLET BY MOUTH ONCE A DAY 30 tablet 11   mometasone (NASONEX) 50 MCG/ACT nasal spray PLACE TWO SPRAYS INTO THE NOSE DAILY 17 g 3   Multiple Vitamin (MULTIVITAMIN WITH MINERALS) TABS tablet Take 1 tablet by mouth daily.      omeprazole (PRILOSEC) 40 MG capsule Take 1 capsule (40 mg total) by mouth daily. 90 capsule 1   ondansetron (ZOFRAN) 4  MG tablet Take 1 tablet (4 mg total) by mouth daily as needed for nausea or vomiting. 30 tablet 0   prednisoLONE acetate (PRED FORTE) 1 % ophthalmic suspension Place 1 drop into both eyes daily at 2 PM. 5 mL 4   predniSONE (DELTASONE) 5 MG tablet TAKE ONE TABLET BY MOUTH ONCE A DAY WITH BREAKFAST. 60 tablet 1   No current facility-administered medications for this visit.      Marland Kitchen  PHYSICAL EXAMINATION: ECOG PERFORMANCE STATUS: 1 - Symptomatic but completely ambulatory  Vitals:   03/14/23 1255  BP: 137/61  Pulse: 72  Temp: (!) 96.9 F (36.1 C)  SpO2: 100%     Filed Weights   03/14/23 1255  Weight:  129 lb 3.2 oz (58.6 kg)      Physical Exam HENT:     Head: Normocephalic and atraumatic.     Mouth/Throat:     Pharynx: No oropharyngeal exudate.  Eyes:     Pupils: Pupils are equal, round, and reactive to light.     Comments: Chronic drooping of the left eyelid.  Cardiovascular:     Rate and Rhythm: Normal rate and regular rhythm.  Pulmonary:     Effort: No respiratory distress.     Breath sounds: No wheezing.  Abdominal:     General: Bowel sounds are normal. There is no distension.     Palpations: Abdomen is soft. There is no mass.     Tenderness: There is no abdominal tenderness. There is no guarding or rebound.  Musculoskeletal:        General: No tenderness. Normal range of motion.     Cervical back: Normal range of motion and neck supple.  Skin:    General: Skin is warm.  Neurological:     Mental Status: He is alert and oriented to person, place, and time.  Psychiatric:        Mood and Affect: Affect normal.      LABORATORY DATA:  I have reviewed the data as listed Lab Results  Component Value Date   WBC 12.1 (H) 03/14/2023   HGB 11.1 (L) 03/14/2023   HCT 34.6 (L) 03/14/2023   MCV 98.9 03/14/2023   PLT 232 03/14/2023   Recent Labs    11/28/22 1320 01/02/23 2014 02/27/23 1712 03/14/23 1302  NA 137 135 139 136  K 4.4 4.7 4.4 4.2  CL 104 105 102  104  CO2 25 22 26 24   GLUCOSE 102* 173* 151* 92  BUN 48* 42* 34* 29*  CREATININE 2.81* 2.90* 2.49* 2.25*  CALCIUM 9.6 9.0 9.9 9.1  GFRNONAA 21* 20* 24* 27*  PROT 7.1 6.7  --  6.8  ALBUMIN 4.2 3.9  --  4.0  AST 22 17  --  19  ALT 18 14  --  17  ALKPHOS 51 48  --  55  BILITOT 0.6 0.7  --  0.6    RADIOGRAPHIC STUDIES: I have personally reviewed the radiological images as listed and agreed with the findings in the report. DG Chest 2 View  Result Date: 02/27/2023 CLINICAL DATA:  Chest pain EXAM: CHEST - 2 VIEW COMPARISON:  11/05/2022 FINDINGS: Hyperinflation. No consolidation, pneumothorax or effusion. No edema. Normal cardiopericardial silhouette. Moderate hiatal hernia. Subtle dense lung nodules are stable, possibly related to old granulomatous disease. Air-fluid level along the stomach beneath the left hemidiaphragm. IMPRESSION: Hyperinflation with chronic changes.  Hiatal hernia. Electronically Signed   By: Karen Kays M.D.   On: 02/27/2023 18:33     ASSESSMENT & PLAN:   History of bladder cancer # High-grade transitional cell carcinoma of the bladder metastatic to retroperitoneal lymph node.   Stage IV- MARCH 26 th, 2024- No significant interval change; No developing new mass lesion, fluid collection or lymph node enlargement.  Last immunotherapy infusion January 2022.  Continue surveillance at this time.  Plan imaging only if patient is symptomatic. Stable.   # History of local recurrent bladder ca < 1cm agree with TURBT [march 2022] [Dr.Stoiof-cystoscopy NOV 2023]-APRIl 2024 s/p evaluation with urology-negative for recurrence stable.   # Hiatal hernia: S/p upper GI endoscopy re: dysphagia [Dr.Anna]; awaiting further work up.   # Pulmonary: SEP 2022- CT-emphysema /bronchial thickening /bilateral basilar groundglass opacities -?  Resolving  BOOP/ fibrosis Vs. fungal infection [s/p itraconazole; Dr.Aleskerov]; pleural plaques noted-CT scan March 2024 Stable.      # Iatrogenic  hypothyroidism- on Synthroid; 100 mcg wll repeat today-awaiting labs today. Stable.   # Anemia sec to CKD-III-IV/ on Iv iron.Hb-11-12-Clinically- Stable. ; Hold Venofer today.  # CKD stageIII-IV GFR-24 -Clinically  Stable. recommend increase fluid intake.  #Weight loss improving; continue prednisone for appetite/also above pneumonitis- 5mg  /day- Stable.   # IV access: NO port/PIV  mychart-Thyroid  # DISPOSITION:  # HOLD venofer  # Follow-up in 6  months-;MD; cbc/cmp;iron studies;ferritin;  thyoid panel ;possible venofer  Dr.B  # I reviewed the blood work- with the patient in detail; also reviewed the imaging independently [as summarized above]; and with the patient in detail.      All questions were answered. The patient knows to call the clinic with any problems, questions or concerns.    Earna Coder, MD 03/14/2023 1:46 PM

## 2023-03-14 NOTE — Progress Notes (Signed)
Had an endoscopy last week at [facility] and biopsy. Having u/s next week for hernia.

## 2023-03-14 NOTE — Assessment & Plan Note (Addendum)
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   Stage IV- MARCH 26 th, 2024- No significant interval change; No developing new mass lesion, fluid collection or lymph node enlargement.  Last immunotherapy infusion January 2022.  Continue surveillance at this time.  Plan imaging only if patient is symptomatic. Stable.   # History of local recurrent bladder ca < 1cm agree with TURBT [march 2022] [Dr.Stoiof-cystoscopy NOV 2023]-APRIl 2024 s/p evaluation with urology-negative for recurrence stable.   # Hiatal hernia: S/p upper GI endoscopy re: dysphagia [Dr.Anna]; awaiting further work up.   # Pulmonary: SEP 2022- CT-emphysema /bronchial thickening /bilateral basilar groundglass opacities -?  Resolving  BOOP/ fibrosis Vs. fungal infection [s/p itraconazole; Dr.Aleskerov]; pleural plaques noted-CT scan March 2024 Stable.      # Iatrogenic hypothyroidism- on Synthroid; 100 mcg wll repeat today-awaiting labs today. Stable.   # Anemia sec to CKD-III-IV/ on Iv iron.Hb-11-12-Clinically- Stable. ; Hold Venofer today.  # CKD stageIII-IV GFR-24 -Clinically  Stable. recommend increase fluid intake.  #Weight loss improving; continue prednisone for appetite/also above pneumonitis- 5mg  /day- Stable.   # IV access: NO port/PIV  mychart-Thyroid  # DISPOSITION:  # HOLD venofer  # Follow-up in 6  months-;MD; cbc/cmp;iron studies;ferritin;  thyoid panel ;possible venofer  Dr.B  # I reviewed the blood work- with the patient in detail; also reviewed the imaging independently [as summarized above]; and with the patient in detail.

## 2023-03-15 LAB — THYROID PANEL WITH TSH
Free Thyroxine Index: 2 (ref 1.2–4.9)
T3 Uptake Ratio: 30 % (ref 24–39)
T4, Total: 6.5 ug/dL (ref 4.5–12.0)
TSH: 1.97 u[IU]/mL (ref 0.450–4.500)

## 2023-03-19 ENCOUNTER — Other Ambulatory Visit: Payer: Medicare HMO

## 2023-03-26 ENCOUNTER — Ambulatory Visit
Admission: RE | Admit: 2023-03-26 | Discharge: 2023-03-26 | Disposition: A | Discharge status: Home / Self Care | Payer: Medicare HMO | Source: Ambulatory Visit | Attending: Gastroenterology | Admitting: Gastroenterology

## 2023-03-26 DIAGNOSIS — R1319 Other dysphagia: Secondary | ICD-10-CM | POA: Insufficient documentation

## 2023-03-26 DIAGNOSIS — K449 Diaphragmatic hernia without obstruction or gangrene: Secondary | ICD-10-CM | POA: Diagnosis not present

## 2023-04-04 ENCOUNTER — Telehealth: Payer: Self-pay

## 2023-04-04 NOTE — Telephone Encounter (Signed)
Called patient but I was not able to leave him a voicemail. However, I saw that he is active on MyChart and reads messages, so I will send him a messages through there.

## 2023-04-04 NOTE — Telephone Encounter (Signed)
-----   Message from [name] sent at 04/03/2023 12:47 PM EDT ----- Edward Cummings inform patient her GI series shows a very large hiatal hernia which is what I saw on his endoscopy.  This is very likely the reason for his dysphagia.  At his age options for repair of hiatal hernia Cummings be limited due to risks but he could con sider having a discussion with the surgeons .

## 2023-04-13 ENCOUNTER — Other Ambulatory Visit: Payer: Self-pay | Admitting: Internal Medicine

## 2023-04-13 NOTE — Telephone Encounter (Signed)
Component Ref Range & Units 1 mo ago (03/14/23) 4 mo ago (11/28/22) 5 mo ago (10/19/22) 7 mo ago (09/12/22) 11 mo ago (05/09/22) 1 yr ago (01/24/22) 1 yr ago (10/20/21)  TSH 0.450 - 4.500 uIU/mL 1.970 2.860 2.189 R, CM 0.834 2.900 0.645 0.094 Low   T4, Total 4.5 - 12.0 ug/dL 6.5 6.6  6.2 5.5 5.5 7.9  T3 Uptake Ratio 24 - 39 % 30 25  29 25 27 31   Free Thyroxine Index 1.2 - 4.9 2.0 1.7 CM  1.8 CM 1.4 CM 1.5 CM 2.4 CM  Comment: (NOTE) Performed At: [facility] [address] [place], [place] [id number] Jolene Schimke MD ON:[id number]  Resulting Agency [facility]         Specimen Collected: 03/14/23 13:02 Last Resulted: 03/15/23 08:3

## 2023-05-16 ENCOUNTER — Ambulatory Visit: Payer: Medicare HMO | Admitting: Urology

## 2023-05-16 ENCOUNTER — Encounter: Payer: Self-pay | Admitting: Urology

## 2023-05-16 VITALS — BP 130/72 | HR 78 | Ht 68.0 in | Wt 130.0 lb

## 2023-05-16 DIAGNOSIS — R8290 Unspecified abnormal findings in urine: Secondary | ICD-10-CM | POA: Diagnosis not present

## 2023-05-16 DIAGNOSIS — Z8551 Personal history of malignant neoplasm of bladder: Secondary | ICD-10-CM | POA: Diagnosis not present

## 2023-05-16 DIAGNOSIS — C7910 Secondary malignant neoplasm of unspecified urinary organs: Secondary | ICD-10-CM | POA: Diagnosis not present

## 2023-05-16 DIAGNOSIS — N184 Chronic kidney disease, stage 4 (severe): Secondary | ICD-10-CM | POA: Diagnosis not present

## 2023-05-16 DIAGNOSIS — I129 Hypertensive chronic kidney disease with stage 1 through stage 4 chronic kidney disease, or unspecified chronic kidney disease: Secondary | ICD-10-CM | POA: Diagnosis not present

## 2023-05-16 DIAGNOSIS — D631 Anemia in chronic kidney disease: Secondary | ICD-10-CM | POA: Diagnosis not present

## 2023-05-16 LAB — MICROSCOPIC EXAMINATION

## 2023-05-16 LAB — URINALYSIS, COMPLETE
Bilirubin, UA: NEGATIVE
Glucose, UA: NEGATIVE
Ketones, UA: NEGATIVE
Leukocytes,UA: NEGATIVE
Nitrite, UA: NEGATIVE
Protein,UA: NEGATIVE
RBC, UA: NEGATIVE
Specific Gravity, UA: 1.01 (ref 1.005–1.030)
Urobilinogen, Ur: 0.2 mg/dL (ref 0.2–1.0)
pH, UA: 6 (ref 5.0–7.5)

## 2023-05-16 NOTE — Progress Notes (Signed)
   05/16/23  CC:  Chief Complaint  Patient presents with   Cysto    Indications: History of urothelial carcinoma the bladder  status post TURBT 01/25/2018 for a 4 cm bladder mass and bilateral hydronephrosis.  He was noted to have moderate right hydronephrosis and left hydronephrosis and had bilateral stents placed.   He was subsequently found to have metastatic disease to the retroperitoneal lymph nodes and bulky retroperitoneal adenopathy as well as mediastinal uptake and uptake in the right pubic rami.  Treated with palliative therapy and oncology and did not follow-up with urology until 12/2018.   Cystoscopy with bilateral retrograde pyelograms showed no persistent hydronephrosis and stents were removed.  No evidence of urothelial recurrence Underwent cystoscopy with bladder biopsies 09/2020 for mucosal erythema with pathology returning reactive lymphoid aggregates with inflammatory changes.  HPI: Edward Cummings has no complaints today.  He denies gross hematuria.  Last visit Dr. Donneta Romberg 03/14/2023.  He was most recently treated with Opdivo however was currently on hold secondary to poor tolerance and presently on surveillance.  Last immunotherapy was January 2022 and cancer center follow-up scheduled April 2024  Blood pressure (!) 157/80, pulse (!) 105, height 5\' 8"  (1.727 m), weight 131 lb (59.4 kg). NED. A&Ox3.   No respiratory distress   Abd soft, NT, ND Normal phallus with bilateral descended testicles  Cystoscopy Procedure Note  Patient identification was confirmed, informed consent was obtained, and patient was prepped using Betadine solution.  Lidocaine jelly was administered per urethral meatus.     Pre-Procedure: - Inspection reveals a normal caliber urethral meatus.  Procedure: The flexible cystoscope was introduced without difficulty - No urethral strictures/lesions are present. -  Moderate lateral lobe enlargement  prostate  -  Moderate elevation  bladder neck -  Bilateral ureteral orifices identified; right resected - Bladder mucosa  reveals no ulcers, tumors, or lesions - No bladder stones - No trabeculation  Retroflexion shows no abnormalities   Post-Procedure: - Patient tolerated the procedure well  Assessment/ Plan: No mucosal abnormalities noted No bladder recurrence since 2019 and will move to annual surveillance cystoscopy   Riki Altes, MD

## 2023-05-21 ENCOUNTER — Ambulatory Visit: Payer: Medicare HMO | Admitting: Internal Medicine

## 2023-05-22 ENCOUNTER — Other Ambulatory Visit: Payer: Self-pay | Admitting: Nephrology

## 2023-05-22 DIAGNOSIS — I129 Hypertensive chronic kidney disease with stage 1 through stage 4 chronic kidney disease, or unspecified chronic kidney disease: Secondary | ICD-10-CM | POA: Diagnosis not present

## 2023-05-22 DIAGNOSIS — N184 Chronic kidney disease, stage 4 (severe): Secondary | ICD-10-CM | POA: Diagnosis not present

## 2023-05-22 DIAGNOSIS — D631 Anemia in chronic kidney disease: Secondary | ICD-10-CM | POA: Diagnosis not present

## 2023-05-28 ENCOUNTER — Ambulatory Visit
Admission: RE | Admit: 2023-05-28 | Discharge: 2023-05-28 | Disposition: A | Discharge status: Home / Self Care | Payer: Medicare HMO | Source: Ambulatory Visit | Attending: Nephrology | Admitting: Nephrology

## 2023-05-28 DIAGNOSIS — N184 Chronic kidney disease, stage 4 (severe): Secondary | ICD-10-CM | POA: Diagnosis not present

## 2023-05-28 DIAGNOSIS — D631 Anemia in chronic kidney disease: Secondary | ICD-10-CM | POA: Diagnosis not present

## 2023-05-28 DIAGNOSIS — N281 Cyst of kidney, acquired: Secondary | ICD-10-CM | POA: Diagnosis not present

## 2023-05-29 ENCOUNTER — Other Ambulatory Visit: Payer: Self-pay | Admitting: Internal Medicine

## 2023-05-29 DIAGNOSIS — E782 Mixed hyperlipidemia: Secondary | ICD-10-CM

## 2023-05-29 NOTE — Telephone Encounter (Signed)
Requested medication (s) are due for refill today: na   Requested medication (s) are on the active medication list: yes   Last refill:  08/25/22 #90 1 refills   Future visit scheduled: yes tomorrow  Notes to clinic:  last ordered by Edward Catholic, NP 08/25/22. Do you want to refill Rx?     Requested Prescriptions  Pending Prescriptions Disp Refills   amLODipine (NORVASC) 10 MG tablet [Pharmacy Med Name: AMLODIPINE BESYLATE 10MG  TABLET] 90 tablet 1    Sig: TAKE ONE TABLET BY MOUTH ONCE DAILY     Cardiovascular: Calcium Channel Blockers 2 Passed - 05/29/2023  3:42 PM      Passed - Last BP in normal range    BP Readings from Last 1 Encounters:  05/16/23 130/72         Passed - Last Heart Rate in normal range    Pulse Readings from Last 1 Encounters:  05/16/23 78         Passed - Valid encounter within last 6 months    Recent Outpatient Visits           2 months ago Gastroesophageal reflux disease without esophagitis   [place] [facility] [place], Salvadore Oxford, NP   4 months ago Other fatigue   [place] [facility] [place], [place] W, NP   4 months ago Gastroesophageal reflux disease without esophagitis   [place] [facility] [place], Salvadore Oxford, NP   6 months ago Vitamin D deficiency   [place] [facility] [place], Salvadore Oxford, NP       Future Appointments             Tomorrow Sampson Si, Salvadore Oxford, NP [place] [facility]            Signed Prescriptions Disp Refills   atorvastatin (LIPITOR) 10 MG tablet 90 tablet 1    Sig: TAKE ONE TABLET (10 MG TOTAL) BY MOUTH DAILY.     Cardiovascular:  Antilipid - Statins Failed - 05/29/2023  3:42 PM      Failed - Lipid Panel in normal range within the last 12 months    Cholesterol  Date Value Ref Range Status  11/30/2022 212 (H) <200 mg/dL Final   LDL Cholesterol (Calc)  Date Value Ref Range Status  11/30/2022 125 (H) mg/dL (calc) Final     Comment:    Reference range: <100 . Desirable range <100 mg/dL for primary prevention;   <70 mg/dL for patients with CHD or diabetic patients  with > or = 2 CHD risk factors. Marland Kitchen LDL-C is now calculated using the Martin-Hopkins  calculation, which is a validated novel method providing  better accuracy than the Friedewald equation in the  estimation of LDL-C.  Horald Pollen et al. Lenox Ahr. 1610;960(45): 2061-2068  (http://education.[facility].com/faq/FAQ164)    HDL  Date Value Ref Range Status  11/30/2022 60 > OR = 40 mg/dL Final   Triglycerides  Date Value Ref Range Status  11/30/2022 157 (H) <150 mg/dL Final         Passed - Patient is not pregnant      Passed - Valid encounter within last 12 months    Recent Outpatient Visits           2 months ago Gastroesophageal reflux disease without esophagitis    [facility] [place], Salvadore Oxford, NP   4 months ago Other fatigue   [facility] [place], [place] W, NP   4 months ago Gastroesophageal reflux disease without esophagitis   [place] [facility] [place], Salvadore Oxford, NP   6 months ago Vitamin D deficiency   [place] [facility] [place], Salvadore Oxford, NP       Future Appointments             Tomorrow Sampson Si, Salvadore Oxford, NP [place] [facility], [facility]

## 2023-05-29 NOTE — Telephone Encounter (Signed)
Requested by interface surescripts. Future visit tomorrow.  Requested Prescriptions  Pending Prescriptions Disp Refills   amLODipine (NORVASC) 10 MG tablet [Pharmacy Med Name: AMLODIPINE BESYLATE 10MG  TABLET] 90 tablet 1    Sig: TAKE ONE TABLET BY MOUTH ONCE DAILY     Cardiovascular: Calcium Channel Blockers 2 Passed - 05/29/2023  3:42 PM      Passed - Last BP in normal range    BP Readings from Last 1 Encounters:  05/16/23 130/72         Passed - Last Heart Rate in normal range    Pulse Readings from Last 1 Encounters:  05/16/23 78         Passed - Valid encounter within last 6 months    Recent Outpatient Visits           2 months ago Gastroesophageal reflux disease without esophagitis   [place] [facility] [place] [facility] [place], Salvadore Oxford, NP   4 months ago Other fatigue   [place] [facility] [place], [place] W, NP   4 months ago Gastroesophageal reflux disease without esophagitis   [place] [facility] [place], Salvadore Oxford, NP   6 months ago Vitamin D deficiency   [place] [facility] [place], Salvadore Oxford, NP       Future Appointments             Tomorrow Sampson Si, Salvadore Oxford, NP [place] [facility]             atorvastatin (LIPITOR) 10 MG tablet [Pharmacy Med Name: ATORVASTATIN CALCIUM 10MG  TABLET] 90 tablet 1    Sig: TAKE ONE TABLET (10 MG TOTAL) BY MOUTH DAILY.     Cardiovascular:  Antilipid - Statins Failed - 05/29/2023  3:42 PM      Failed - Lipid Panel in normal range within the last 12 months    Cholesterol  Date Value Ref Range Status  11/30/2022 212 (H) <200 mg/dL Final   LDL Cholesterol (Calc)  Date Value Ref Range Status  11/30/2022 125 (H) mg/dL (calc) Final    Comment:    Reference range: <100 . Desirable range <100 mg/dL for primary prevention;   <70 mg/dL for patients with CHD or diabetic patients  with > or = 2 CHD risk factors. Marland Kitchen LDL-C is now calculated using the  Martin-Hopkins  calculation, which is a validated novel method providing  better accuracy than the Friedewald equation in the  estimation of LDL-C.  Horald Pollen et al. Lenox Ahr. 8657;846(96): 2061-2068  (http://education.[facility].com/faq/FAQ164)    HDL  Date Value Ref Range Status  11/30/2022 60 > OR = 40 mg/dL Final   Triglycerides  Date Value Ref Range Status  11/30/2022 157 (H) <150 mg/dL Final         Passed - Patient is not pregnant      Passed - Valid encounter within last 12 months    Recent Outpatient Visits           2 months ago Gastroesophageal reflux disease without esophagitis   [place] [facility] [place], Salvadore Oxford, NP   4 months ago Other fatigue   [place] [facility] [place], [place], NP   4 months ago Gastroesophageal reflux disease without esophagitis   [place] [facility] [place], Salvadore Oxford, NP   6 months ago Vitamin D deficiency   [facility] [place], Salvadore Oxford, [place]  Future Appointments             Tomorrow Baity, Salvadore Oxford, NP  [facility], [facility]

## 2023-05-30 ENCOUNTER — Ambulatory Visit: Payer: Medicare HMO | Admitting: Internal Medicine

## 2023-05-30 NOTE — Progress Notes (Deleted)
Subjective:    Patient ID: Edward Cummings, male    DOB: Apr 26, 1933, 89 y.o.   MRN: [id number]  HPI  Patient presents to clinic today for his annual exam.  Flu: 01/2023 Tetanus: 12/2022 COVID: X 3 Pneumovax: 02/2020 Prevnar: 02/2015 Shingrix: 02/2017, 05/2017 PSA screening: >2 years ago Colon screening: No longer screening Vision screening: Dentist:  Diet: Exercise:   Review of Systems   Past Medical History:  Diagnosis Date   Anemia    Anxiety 12/31/2019   Cancer (HCC)    bladder   Chronic kidney disease    Depression    Hypertension    Neuromuscular disorder (HCC)    Nerve damage to left face/eye since around 2002.    Current Outpatient Medications  Medication Sig Dispense Refill   atorvastatin (LIPITOR) 10 MG tablet TAKE ONE TABLET (10 MG TOTAL) BY MOUTH DAILY. 90 tablet 1   amLODipine (NORVASC) 10 MG tablet TAKE ONE TABLET BY MOUTH ONCE DAILY 90 tablet 1   aspirin EC 81 MG tablet Take 81 mg by mouth daily.     Ipratropium-Albuterol (COMBIVENT RESPIMAT) 20-100 MCG/ACT AERS respimat Inhale 1 puff into the lungs every 6 (six) hours as needed for wheezing or shortness of breath. 4 g 0   levothyroxine (SYNTHROID) 75 MCG tablet TAKE ONE TABLET BY MOUTH EVERY MORNING BEFORE BREAKFAST 30 tablet 3   loratadine (CLARITIN) 10 MG tablet TAKE 1 TABLET BY MOUTH ONCE A DAY 30 tablet 11   mometasone (NASONEX) 50 MCG/ACT nasal spray PLACE TWO SPRAYS INTO THE NOSE DAILY 17 g 3   Multiple Vitamin (MULTIVITAMIN WITH MINERALS) TABS tablet Take 1 tablet by mouth daily.      omeprazole (PRILOSEC) 40 MG capsule Take 1 capsule (40 mg total) by mouth daily. 90 capsule 1   ondansetron (ZOFRAN) 4 MG tablet Take 1 tablet (4 mg total) by mouth daily as needed for nausea or vomiting. 30 tablet 0   prednisoLONE acetate (PRED FORTE) 1 % ophthalmic suspension Place 1 drop into both eyes daily at 2 PM. 5 mL 4   predniSONE (DELTASONE) 5 MG tablet TAKE ONE TABLET BY MOUTH ONCE A DAY WITH BREAKFAST. 60  tablet 1   No current facility-administered medications for this visit.    No Known Allergies  Family History  Problem Relation Age of Onset   Prostate cancer Neg Hx    Kidney cancer Neg Hx    Bladder Cancer Neg Hx    Cancer - Prostate Neg Hx     Social History   Socioeconomic History   Marital status: Widowed    Spouse name: Talbert Forest   Number of children: 2   Years of education: 6th grade   Highest education level: 6th grade  Occupational History   Not on file  Tobacco Use   Smoking status: Former    Current packs/day: 0.00    Types: Cigarettes    Quit date: 2010    Years since quitting: 14.9   Smokeless tobacco: Former    Types: Chew   Tobacco comments:    Stopped approximately 10 years ago.  Vaping Use   Vaping status: Never Used  Substance and Sexual Activity   Alcohol use: Yes    Alcohol/week: 2.0 standard drinks of alcohol    Types: 2 Cans of beer per week    Comment: occasionally   Drug use: Never   Sexual activity: Not Currently    Birth control/protection: None  Other Topics Concern   Not on  file  Social History Narrative    lives in [place]; with oldest son. Wife died in 29-Jun-2019.  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 Pt still drives and is indepent with no serious chronic disease. Worked in [employer]    Social Drivers of Health   Financial Resource Strain: Low Risk  (02/08/2023)   Overall Financial Resource Strain (CARDIA)    Difficulty of Paying Living Expenses: Not hard at all  Food Insecurity: No Food Insecurity (02/08/2023)   Hunger Vital Sign    Worried About Running Out of Food in the Last Year: Never true    Ran Out of Food in the Last Year: Never true  Transportation Needs: No Transportation Needs (02/08/2023)   PRAPARE - Administrator, Civil Service (Medical): No    Lack of Transportation (Non-Medical): No  Physical Activity: Insufficiently Active (02/08/2023)    Exercise Vital Sign    Days of Exercise per Week: 3 days    Minutes of Exercise per Session: 30 min  Stress: No Stress Concern Present (02/08/2023)   [employer] of Occupational Health - Occupational Stress Questionnaire    Feeling of Stress : Only a little  Social Connections: Socially Isolated (02/08/2023)   Social Connection and Isolation Panel [NHANES]    Frequency of Communication with Friends and Family: More than three times a week    Frequency of Social Gatherings with Friends and Family: More than three times a week    Attends Religious Services: Never    Database administrator or Organizations: No    Attends Banker Meetings: Never    Marital Status: Widowed  Intimate Partner Violence: Not At Risk (02/08/2023)   Humiliation, Afraid, Rape, and Kick questionnaire    Fear of Current or Ex-Partner: No    Emotionally Abused: No    Physically Abused: No    Sexually Abused: No     Constitutional: Denies fever, malaise, fatigue, headache or abrupt weight changes.  HEENT: Denies eye pain, eye redness, ear pain, ringing in the ears, wax buildup, runny nose, nasal congestion, bloody nose, or sore throat. Respiratory: Denies difficulty breathing, shortness of breath, cough or sputum production.   Cardiovascular: Denies chest pain, chest tightness, palpitations or swelling in the hands or feet.  Gastrointestinal: Denies abdominal pain, bloating, constipation, diarrhea or blood in the stool.  GU: Denies urgency, frequency, pain with urination, burning sensation, blood in urine, odor or discharge. Musculoskeletal: Denies decrease in range of motion, difficulty with gait, muscle pain or joint pain and swelling.  Skin: Denies redness, rashes, lesions or ulcercations.  Neurological: Denies dizziness, difficulty with memory, difficulty with speech or problems with balance and coordination.  Psych: Patient has a history of anxiety and depression.  Denies SI/HI.  No other  specific complaints in a complete review of systems (except as listed in HPI above).      Objective:   Physical Exam  There were no vitals taken for this visit. Wt Readings from Last 3 Encounters:  05/16/23 130 lb (59 kg)  03/14/23 129 lb 3.2 oz (58.6 kg)  03/08/23 125 lb (56.7 kg)    General: Appears their stated age, well developed, well nourished in NAD. Skin: Warm, dry and intact. No rashes, lesions or ulcerations noted. HEENT: Head: normal shape and size; Eyes: sclera white, no icterus, conjunctiva pink, PERRLA and EOMs intact; Ears: Tm's gray and intact, normal light reflex; Nose: mucosa pink and moist,  septum midline; Throat/Mouth: Teeth present, mucosa pink and moist, no exudate, lesions or ulcerations noted.  Neck:  Neck supple, trachea midline. No masses, lumps or thyromegaly present.  Cardiovascular: Normal rate and rhythm. S1,S2 noted.  No murmur, rubs or gallops noted. No JVD or BLE edema. No carotid bruits noted. Pulmonary/Chest: Normal effort and positive vesicular breath sounds. No respiratory distress. No wheezes, rales or ronchi noted.  Abdomen: Soft and nontender. Normal bowel sounds. No distention or masses noted. Liver, spleen and kidneys non palpable. Musculoskeletal: Normal range of motion. No signs of joint swelling. No difficulty with gait.  Neurological: Alert and oriented. Cranial nerves II-XII grossly intact. Coordination normal.  Psychiatric: Mood and affect normal. Behavior is normal. Judgment and thought content normal.    BMET    Component Value Date/Time   NA 136 03/14/2023 1302   K 4.2 03/14/2023 1302   CL 104 03/14/2023 1302   CO2 24 03/14/2023 1302   GLUCOSE 92 03/14/2023 1302   BUN 29 (H) 03/14/2023 1302   CREATININE 2.25 (H) 03/14/2023 1302   CALCIUM 9.1 03/14/2023 1302   GFRNONAA 27 (L) 03/14/2023 1302   GFRAA 29 (L) 03/04/2020 0801    Lipid Panel     Component Value Date/Time   CHOL 212 (H) 11/30/2022 1435   TRIG 157 (H) 11/30/2022  1435   HDL 60 11/30/2022 1435   CHOLHDL 3.5 11/30/2022 1435   LDLCALC 125 (H) 11/30/2022 1435    CBC    Component Value Date/Time   WBC 12.1 (H) 03/14/2023 1302   WBC 9.8 02/27/2023 1712   RBC 3.50 (L) 03/14/2023 1302   HGB 11.1 (L) 03/14/2023 1302   HCT 34.6 (L) 03/14/2023 1302   PLT 232 03/14/2023 1302   MCV 98.9 03/14/2023 1302   MCH 31.7 03/14/2023 1302   MCHC 32.1 03/14/2023 1302   RDW 13.0 03/14/2023 1302   LYMPHSABS 1.6 03/14/2023 1302   MONOABS 0.8 03/14/2023 1302   EOSABS 0.6 (H) 03/14/2023 1302   BASOSABS 0.1 03/14/2023 1302    Hgb A1C Lab Results  Component Value Date   HGBA1C 5.7 01/03/2023            Assessment & Plan:   Preventative health maintenance:  Flu shot UTD Tetanus UTD Encouraged him to get his COVID booster Pneumovax and Prevnar 13 UTD Shingrix UTD He no longer needs to screen for colon cancer Encouraged him to consume a balanced diet and exercise regimen Advised him to see an eye doctor and dentist annually We will check CBC, c-Met, lipid, A1c today  RTC in 6 months, follow-up chronic conditions Nicki Reaper, NP

## 2023-06-07 ENCOUNTER — Ambulatory Visit (INDEPENDENT_AMBULATORY_CARE_PROVIDER_SITE_OTHER): Payer: Medicare HMO | Admitting: Internal Medicine

## 2023-06-07 ENCOUNTER — Encounter: Payer: Self-pay | Admitting: Internal Medicine

## 2023-06-07 VITALS — BP 118/70 | HR 68 | Wt 123.4 lb

## 2023-06-07 DIAGNOSIS — Z0001 Encounter for general adult medical examination with abnormal findings: Secondary | ICD-10-CM | POA: Diagnosis not present

## 2023-06-07 DIAGNOSIS — Z125 Encounter for screening for malignant neoplasm of prostate: Secondary | ICD-10-CM | POA: Diagnosis not present

## 2023-06-07 DIAGNOSIS — Z136 Encounter for screening for cardiovascular disorders: Secondary | ICD-10-CM | POA: Diagnosis not present

## 2023-06-07 DIAGNOSIS — R739 Hyperglycemia, unspecified: Secondary | ICD-10-CM | POA: Diagnosis not present

## 2023-06-07 DIAGNOSIS — E039 Hypothyroidism, unspecified: Secondary | ICD-10-CM

## 2023-06-07 NOTE — Patient Instructions (Signed)
Health Maintenance After Age 87 After age 87, you are at a higher risk for certain long-term diseases and infections as well as injuries from falls. Falls are a major cause of broken bones and head injuries in people who are older than age 87. Getting regular preventive care can help to keep you healthy and well. Preventive care includes getting regular testing and making lifestyle changes as recommended by your health care provider. Talk with your health care provider about: Which screenings and tests you should have. A screening is a test that checks for a disease when you have no symptoms. A diet and exercise plan that is right for you. What should I know about screenings and tests to prevent falls? Screening and testing are the best ways to find a health problem early. Early diagnosis and treatment give you the best chance of managing medical conditions that are common after age 87. Certain conditions and lifestyle choices may make you more likely to have a fall. Your health care provider may recommend: Regular vision checks. Poor vision and conditions such as cataracts can make you more likely to have a fall. If you wear glasses, make sure to get your prescription updated if your vision changes. Medicine review. Work with your health care provider to regularly review all of the medicines you are taking, including over-the-counter medicines. Ask your health care provider about any side effects that may make you more likely to have a fall. Tell your health care provider if any medicines that you take make you feel dizzy or sleepy. Strength and balance checks. Your health care provider may recommend certain tests to check your strength and balance while standing, walking, or changing positions. Foot health exam. Foot pain and numbness, as well as not wearing proper footwear, can make you more likely to have a fall. Screenings, including: Osteoporosis screening. Osteoporosis is a condition that causes  the bones to get weaker and break more easily. Blood pressure screening. Blood pressure changes and medicines to control blood pressure can make you feel dizzy. Depression screening. You may be more likely to have a fall if you have a fear of falling, feel depressed, or feel unable to do activities that you used to do. Alcohol use screening. Using too much alcohol can affect your balance and may make you more likely to have a fall. Follow these instructions at home: Lifestyle Do not drink alcohol if: Your health care provider tells you not to drink. If you drink alcohol: Limit how much you have to: 0-1 drink a day for women. 0-2 drinks a day for men. Know how much alcohol is in your drink. In the [place] one drink equals one 12 oz bottle of beer (355 mL), one 5 oz glass of wine (148 mL), or one 1 oz glass of hard liquor (44 mL). Do not use any products that contain nicotine or tobacco. These products include cigarettes, chewing tobacco, and vaping devices, such as e-cigarettes. If you need help quitting, ask your health care provider. Activity  Follow a regular exercise program to stay fit. This will help you maintain your balance. Ask your health care provider what types of exercise are appropriate for you. If you need a cane or walker, use it as recommended by your health care provider. Wear supportive shoes that have nonskid soles. Safety  Remove any tripping hazards, such as rugs, cords, and clutter. Install safety equipment such as grab bars in bathrooms and safety rails on stairs. Keep rooms and walkways   well-lit. General instructions Talk with your health care provider about your risks for falling. Tell your health care provider if: You fall. Be sure to tell your health care provider about all falls, even ones that seem minor. You feel dizzy, tiredness (fatigue), or off-balance. Take over-the-counter and prescription medicines only as told by your health care provider. These include  supplements. Eat a healthy diet and maintain a healthy weight. A healthy diet includes low-fat dairy products, low-fat (lean) meats, and fiber from whole grains, beans, and lots of fruits and vegetables. Stay current with your vaccines. Schedule regular health, dental, and eye exams. Summary Having a healthy lifestyle and getting preventive care can help to protect your health and wellness after age 87. Screening and testing are the best way to find a health problem early and help you avoid having a fall. Early diagnosis and treatment give you the best chance for managing medical conditions that are more common for people who are older than age 87. Falls are a major cause of broken bones and head injuries in people who are older than age 87. Take precautions to prevent a fall at home. Work with your health care provider to learn what changes you can make to improve your health and wellness and to prevent falls. This information is not intended to replace advice given to you by your health care provider. Make sure you discuss any questions you have with your health care provider. Document Revised: 10/18/2020 Document Reviewed: 10/18/2020 Elsevier Patient Education  2024 [employer].  

## 2023-06-07 NOTE — Progress Notes (Signed)
Subjective:    Patient ID: Edward Cummings, male    DOB: [birth year], 89 y.o.   MRN: [id number]  HPI  Patient presents to clinic today for his annual exam.  Flu: 01/2023 Tetanus: 12/2022 COVID: never Pneumovax: 02/2020 Prevnar 13: 02/2015 Shingrix: 02/2017, 05/2017 PSA screening: > 2 years ago Colon screening: No longer screening Vision screening: as needed Dentist: as needed  Diet: He does eat meat. He consumes fruits and veggies. He does eat some fried foods. He drinks mostly water. Exercise: Walking  Review of Systems   Past Medical History:  Diagnosis Date   Anemia    Anxiety 12/31/2019   Cancer [facility])    bladder   Chronic kidney disease    Depression    Hypertension    Neuromuscular disorder (HCC)    Nerve damage to left face/eye since around 2002.    Current Outpatient Medications  Medication Sig Dispense Refill   atorvastatin (LIPITOR) 10 MG tablet TAKE ONE TABLET (10 MG TOTAL) BY MOUTH DAILY. 90 tablet 1   amLODipine (NORVASC) 10 MG tablet TAKE ONE TABLET BY MOUTH ONCE DAILY 90 tablet 1   aspirin EC 81 MG tablet Take 81 mg by mouth daily.     Ipratropium-Albuterol (COMBIVENT RESPIMAT) 20-100 MCG/ACT AERS respimat Inhale 1 puff into the lungs every 6 (six) hours as needed for wheezing or shortness of breath. 4 g 0   levothyroxine (SYNTHROID) 75 MCG tablet TAKE ONE TABLET BY MOUTH EVERY MORNING BEFORE BREAKFAST 30 tablet 3   loratadine (CLARITIN) 10 MG tablet TAKE 1 TABLET BY MOUTH ONCE A DAY 30 tablet 11   mometasone (NASONEX) 50 MCG/ACT nasal spray PLACE TWO SPRAYS INTO THE NOSE DAILY 17 g 3   Multiple Vitamin (MULTIVITAMIN WITH MINERALS) TABS tablet Take 1 tablet by mouth daily.      omeprazole (PRILOSEC) 40 MG capsule Take 1 capsule (40 mg total) by mouth daily. 90 capsule 1   ondansetron (ZOFRAN) 4 MG tablet Take 1 tablet (4 mg total) by mouth daily as needed for nausea or vomiting. 30 tablet 0   prednisoLONE acetate (PRED FORTE) 1 % ophthalmic suspension Place 1  drop into both eyes daily at 2 PM. 5 mL 4   predniSONE (DELTASONE) 5 MG tablet TAKE ONE TABLET BY MOUTH ONCE A DAY WITH BREAKFAST. 60 tablet 1   No current facility-administered medications for this visit.    No Known Allergies  Family History  Problem Relation Age of Onset   Prostate cancer Neg Hx    Kidney cancer Neg Hx    Bladder Cancer Neg Hx    Cancer - Prostate Neg Hx     Social History   Socioeconomic History   Marital status: Widowed    Spouse name: Talbert Forest   Number of children: 2   Years of education: 6th grade   Highest education level: 6th grade  Occupational History   Not on file  Tobacco Use   Smoking status: Former    Current packs/day: 0.00    Types: Cigarettes    Quit date: 2010    Years since quitting: 14.9   Smokeless tobacco: Former    Types: Chew   Tobacco comments:    Stopped approximately 10 years ago.  Vaping Use   Vaping status: Never Used  Substance and Sexual Activity   Alcohol use: Yes    Alcohol/week: 2.0 standard drinks of alcohol    Types: 2 Cans of beer per week    Comment: occasionally  Drug use: Never   Sexual activity: Not Currently    Birth control/protection: None  Other Topics Concern   Not on file  Social History Narrative    lives in [place]; with oldest son. Wife died in 18-Jun-2019.  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 Pt still drives and is indepent with no serious chronic disease. Worked in [employer]    Social Drivers of Health   Financial Resource Strain: Low Risk  (02/08/2023)   Overall Financial Resource Strain (CARDIA)    Difficulty of Paying Living Expenses: Not hard at all  Food Insecurity: No Food Insecurity (02/08/2023)   Hunger Vital Sign    Worried About Running Out of Food in the Last Year: Never true    Ran Out of Food in the Last Year: Never true  Transportation Needs: No Transportation Needs (02/08/2023)   PRAPARE - Scientist, research (physical sciences) (Medical): No    Lack of Transportation (Non-Medical): No  Physical Activity: Insufficiently Active (02/08/2023)   Exercise Vital Sign    Days of Exercise per Week: 3 days    Minutes of Exercise per Session: 30 min  Stress: No Stress Concern Present (02/08/2023)   [employer] of Occupational Health - Occupational Stress Questionnaire    Feeling of Stress : Only a little  Social Connections: Socially Isolated (02/08/2023)   Social Connection and Isolation Panel [NHANES]    Frequency of Communication with Friends and Family: More than three times a week    Frequency of Social Gatherings with Friends and Family: More than three times a week    Attends Religious Services: Never    Database administrator or Organizations: No    Attends Banker Meetings: Never    Marital Status: Widowed  Intimate Partner Violence: Not At Risk (02/08/2023)   Humiliation, Afraid, Rape, and Kick questionnaire    Fear of Current or Ex-Partner: No    Emotionally Abused: No    Physically Abused: No    Sexually Abused: No     Constitutional: Denies fever, malaise, fatigue, headache or abrupt weight changes.  HEENT: Denies eye pain, eye redness, ear pain, ringing in the ears, wax buildup, runny nose, nasal congestion, bloody nose, or sore throat. Respiratory: Denies difficulty breathing, shortness of breath, cough or sputum production.   Cardiovascular: Denies chest pain, chest tightness, palpitations or swelling in the hands or feet.  Gastrointestinal: Pt reports difficulty swallowing, intermittent constipation. Denies abdominal pain, bloating, diarrhea or blood in the stool.  GU: Denies urgency, frequency, pain with urination, burning sensation, blood in urine, odor or discharge. Musculoskeletal: Denies decrease in range of motion, difficulty with gait, muscle pain or joint pain and swelling.  Skin: Denies redness, rashes, lesions or ulcercations.  Neurological: Pt reports  intermittent dizziness. Denies difficulty with memory, difficulty with speech or problems with balance and coordination.  Psych: Patient has a history of anxiety and depression.  Denies SI/HI.  No other specific complaints in a complete review of systems (except as listed in HPI above).      Objective:   Physical Exam  BP 118/70   Pulse 68   Wt 123 lb 6.4 oz (56 kg)   SpO2 97%   BMI 18.76 kg/m   Wt Readings from Last 3 Encounters:  05/16/23 130 lb (59 kg)  03/14/23 129 lb 3.2 oz (58.6 kg)  03/08/23 125 lb (56.7 kg)    General: Appears his  stated age, well developed, well nourished in NAD. Skin: Warm, dry and intact.  HEENT: Head: normal shape and size; Eyes: sclera white, no icterus, conjunctiva pink, PERRLA and EOMs intact;  Neck:  Neck supple, trachea midline. No masses, lumps or thyromegaly present.  Cardiovascular: Normal rate and rhythm. S1,S2 noted.  No murmur, rubs or gallops noted. No JVD or BLE edema. No carotid bruits noted. Pulmonary/Chest: Normal effort and positive vesicular breath sounds. No respiratory distress. No wheezes, rales or ronchi noted.  Abdomen: Soft and nontender. Normal bowel sounds.  Musculoskeletal: Strength 5/5 BUE/BLE. No difficulty with gait.  Neurological: Let side facial droop noted (chronic). Alert and oriented. Cranial nerves II-XII grossly intact. Coordination normal.  Psychiatric: Mood and affect normal. Behavior is normal. Judgment and thought content normal.    BMET    Component Value Date/Time   NA 136 03/14/2023 1302   K 4.2 03/14/2023 1302   CL 104 03/14/2023 1302   CO2 24 03/14/2023 1302   GLUCOSE 92 03/14/2023 1302   BUN 29 (H) 03/14/2023 1302   CREATININE 2.25 (H) 03/14/2023 1302   CALCIUM 9.1 03/14/2023 1302   GFRNONAA 27 (L) 03/14/2023 1302   GFRAA 29 (L) 03/04/2020 0801    Lipid Panel     Component Value Date/Time   CHOL 212 (H) 11/30/2022 1435   TRIG 157 (H) 11/30/2022 1435   HDL 60 11/30/2022 1435   CHOLHDL  3.5 11/30/2022 1435   LDLCALC 125 (H) 11/30/2022 1435    CBC    Component Value Date/Time   WBC 12.1 (H) 03/14/2023 1302   WBC 9.8 02/27/2023 1712   RBC 3.50 (L) 03/14/2023 1302   HGB 11.1 (L) 03/14/2023 1302   HCT 34.6 (L) 03/14/2023 1302   PLT 232 03/14/2023 1302   MCV 98.9 03/14/2023 1302   MCH 31.7 03/14/2023 1302   MCHC 32.1 03/14/2023 1302   RDW 13.0 03/14/2023 1302   LYMPHSABS 1.6 03/14/2023 1302   MONOABS 0.8 03/14/2023 1302   EOSABS 0.6 (H) 03/14/2023 1302   BASOSABS 0.1 03/14/2023 1302    Hgb A1C Lab Results  Component Value Date   HGBA1C 5.7 01/03/2023            Assessment & Plan:   Preventative health maintenance:  Flu shot UTD Tetanus UTD Encouraged to get his COVID-vaccine Pneumovax and Prevnar UTD Shingrix UTD He no longer needs to screen for colon cancer Encouraged him to consume a balanced diet and exercise regimen Advised him to see an eye doctor and dentist annually We will check CBC, c-Met, TSH, free T4, lipid, A1c and PSA today  RTC in 6 months, follow-up chronic conditions Nicki Reaper, NP

## 2023-06-08 LAB — TSH+FREE T4: TSH W/REFLEX TO FT4: 2.06 m[IU]/L (ref 0.40–4.50)

## 2023-06-08 LAB — HEMOGLOBIN A1C
Hgb A1c MFr Bld: 5.2 %{Hb} (ref <5.7)
Mean Plasma Glucose: 103 mg/dL
eAG (mmol/L): 5.7 mmol/L

## 2023-06-08 LAB — COMPLETE METABOLIC PANEL WITH GFR
AG Ratio: 1.6 (calc) (ref 1.0–2.5)
ALT: 15 U/L (ref 9–46)
AST: 15 U/L (ref 10–35)
Albumin: 4.1 g/dL (ref 3.6–5.1)
Alkaline phosphatase (APISO): 60 U/L (ref 35–144)
BUN/Creatinine Ratio: 16 (calc) (ref 6–22)
BUN: 34 mg/dL — ABNORMAL HIGH (ref 7–25)
CO2: 25 mmol/L (ref 20–32)
Calcium: 9.7 mg/dL (ref 8.6–10.3)
Chloride: 108 mmol/L (ref 98–110)
Creat: 2.17 mg/dL — ABNORMAL HIGH (ref 0.70–1.22)
Globulin: 2.6 g/dL (ref 1.9–3.7)
Glucose, Bld: 87 mg/dL (ref 65–99)
Potassium: 4.7 mmol/L (ref 3.5–5.3)
Sodium: 142 mmol/L (ref 135–146)
Total Bilirubin: 0.5 mg/dL (ref 0.2–1.2)
Total Protein: 6.7 g/dL (ref 6.1–8.1)
eGFR: 29 mL/min/{1.73_m2} — ABNORMAL LOW (ref >=60)

## 2023-06-08 LAB — CBC
HCT: 33 % — ABNORMAL LOW (ref 38.5–50.0)
Hemoglobin: 10.7 g/dL — ABNORMAL LOW (ref 13.2–17.1)
MCH: 31.8 pg (ref 27.0–33.0)
MCHC: 32.4 g/dL (ref 32.0–36.0)
MCV: 98.2 fL (ref 80.0–100.0)
MPV: 11 fL (ref 7.5–12.5)
Platelets: 277 10*3/uL (ref 140–400)
RBC: 3.36 10*6/uL — ABNORMAL LOW (ref 4.20–5.80)
RDW: 11.9 % (ref 11.0–15.0)
WBC: 10.9 10*3/uL — ABNORMAL HIGH (ref 3.8–10.8)

## 2023-06-08 LAB — LIPID PANEL
Cholesterol: 160 mg/dL (ref <200)
HDL: 61 mg/dL (ref >=40)
LDL Cholesterol (Calc): 76 mg/dL
Non-HDL Cholesterol (Calc): 99 mg/dL (ref <130)
Total CHOL/HDL Ratio: 2.6 (calc) (ref <5.0)
Triglycerides: 142 mg/dL (ref <150)

## 2023-06-08 LAB — PSA: PSA: 0.52 ng/mL (ref <=4.00)

## 2023-06-11 ENCOUNTER — Telehealth: Payer: Self-pay | Admitting: *Deleted

## 2023-06-11 ENCOUNTER — Ambulatory Visit: Payer: Medicare HMO | Admitting: Internal Medicine

## 2023-06-11 NOTE — Telephone Encounter (Signed)
  Chief Complaint: Results Symptoms: NA Frequency: NA Pertinent Negatives: Patient denies NA Disposition: [] ED /[] Urgent Care (no appt availability in office) / [] Appointment(In office/virtual)/ []  [facility]/ [] Home Care/ [] Refused Recommended Disposition /[] [facility]/ []  Follow-up with PCP Additional Notes:    Pt's son [name] , on [place], calling for results. States not sure what new med pt was to take. Result note read, verbalizes understanding  Also states pt said he was to get a covid shot. Son states had vaccine in 2021, questioning if boosters were meant. Please advise.    Anemia is persistent. I would recommend starting oral iron 325 mg daily. This can cause constipation and dark stools. You may want to take a fiber supplement or stool softener as needed. Kidney function decreased but stable. Liver function is normal. Cholesterol looks great. You do not have diabetes. PSA is normal. Thyroid function is normal.

## 2023-06-18 NOTE — Telephone Encounter (Signed)
 Yes, would recommend covid booster

## 2023-06-18 NOTE — Telephone Encounter (Signed)
 Lm advising per Rene Kocher. Call back with any further questions or concerns.

## 2023-06-18 NOTE — Telephone Encounter (Signed)
 Copied from CRM [id number]. Topic: General - Other >> Jun 18, 2023  2:28 PM Santiya F wrote: Reason for CRM: Pt's son is calling in because he received a call from the nurse regarding this pt. Please follow up with pt's son.

## 2023-07-07 ENCOUNTER — Other Ambulatory Visit: Payer: Self-pay | Admitting: Internal Medicine

## 2023-07-13 ENCOUNTER — Encounter: Payer: Self-pay | Admitting: Internal Medicine

## 2023-07-30 ENCOUNTER — Encounter: Payer: Self-pay | Admitting: Internal Medicine

## 2023-07-31 ENCOUNTER — Telehealth: Payer: Self-pay

## 2023-07-31 ENCOUNTER — Ambulatory Visit (INDEPENDENT_AMBULATORY_CARE_PROVIDER_SITE_OTHER): Payer: Medicare Other | Admitting: Internal Medicine

## 2023-07-31 ENCOUNTER — Encounter: Payer: Self-pay | Admitting: Internal Medicine

## 2023-07-31 VITALS — BP 122/64 | Ht 68.0 in | Wt 124.6 lb

## 2023-07-31 DIAGNOSIS — R0981 Nasal congestion: Secondary | ICD-10-CM

## 2023-07-31 DIAGNOSIS — R0982 Postnasal drip: Secondary | ICD-10-CM

## 2023-07-31 MED ORDER — IPRATROPIUM BROMIDE 0.06 % NA SOLN: 2.0000 | Freq: Four times a day (QID) | NASAL | 0 refills | Status: DC

## 2023-07-31 NOTE — Telephone Encounter (Signed)
Son [name]) was wanting to know what happened at visit this morning. Advised son per Regina's note. No testing was done. Dad only c/o runny nose at visit and told us he felt fine otherwise. Advised son to call back if symptoms worsen or fail to improve. Voiced understanding.

## 2023-07-31 NOTE — Progress Notes (Signed)
Subjective:    Patient ID: Edward Cummings, male    DOB: September 27, 1932, 89 y.o.   MRN: [id number]  HPI  Discussed the use of AI scribe software for clinical note transcription with the patient, who gave verbal consent to proceed.   Edward Cummings is an 89 year old male who presents with congestion and body aches.  He has been experiencing symptoms consistent with a cold, primarily nasal congestion and a 'stopped up' nose. He occasionally coughs when blowing his nose. No headache, ear pain, sore throat, significant cough, shortness of breath, vomiting, or diarrhea. No fever or chills.  He reports body aches without fever or chills. He has been using over-the-counter cold and flu medication, which has provided some relief, but he continues to experience a stuffy nose.       Review of Systems   Past Medical History:  Diagnosis Date   Anemia    Anxiety 12/31/2019   Cancer [facility])    bladder   Chronic kidney disease    Depression    Hypertension    Neuromuscular disorder (HCC)    Nerve damage to left face/eye since around 2002.    Current Outpatient Medications  Medication Sig Dispense Refill   atorvastatin (LIPITOR) 10 MG tablet TAKE ONE TABLET (10 MG TOTAL) BY MOUTH DAILY. 90 tablet 1   amLODipine (NORVASC) 10 MG tablet TAKE ONE TABLET BY MOUTH ONCE DAILY 90 tablet 1   aspirin EC 81 MG tablet Take 81 mg by mouth daily.     Ipratropium-Albuterol (COMBIVENT RESPIMAT) 20-100 MCG/ACT AERS respimat Inhale 1 puff into the lungs every 6 (six) hours as needed for wheezing or shortness of breath. 4 g 0   levothyroxine (SYNTHROID) 75 MCG tablet TAKE ONE TABLET BY MOUTH EVERY MORNING BEFORE BREAKFAST 30 tablet 3   loratadine (CLARITIN) 10 MG tablet TAKE 1 TABLET BY MOUTH ONCE A DAY 30 tablet 11   mometasone (NASONEX) 50 MCG/ACT nasal spray PLACE TWO SPRAYS INTO THE NOSE DAILY 17 g 3   Multiple Vitamin (MULTIVITAMIN WITH MINERALS) TABS tablet Take 1 tablet by mouth daily.      omeprazole  (PRILOSEC) 40 MG capsule Take 1 capsule (40 mg total) by mouth daily. 90 capsule 1   ondansetron (ZOFRAN) 4 MG tablet Take 1 tablet (4 mg total) by mouth daily as needed for nausea or vomiting. 30 tablet 0   prednisoLONE acetate (PRED FORTE) 1 % ophthalmic suspension Place 1 drop into both eyes daily at 2 PM. 5 mL 4   predniSONE (DELTASONE) 5 MG tablet TAKE ONE TABLET BY MOUTH ONCE A DAY WITH BREAKFAST. 60 tablet 1   No current facility-administered medications for this visit.    No Known Allergies  Family History  Problem Relation Age of Onset   Prostate cancer Neg Hx    Kidney cancer Neg Hx    Bladder Cancer Neg Hx    Cancer - Prostate Neg Hx     Social History   Socioeconomic History   Marital status: Widowed    Spouse name: Talbert Forest   Number of children: 2   Years of education: 6th grade   Highest education level: 6th grade  Occupational History   Not on file  Tobacco Use   Smoking status: Former    Current packs/day: 0.00    Types: Cigarettes    Quit date: 2010    Years since quitting: 15.1   Smokeless tobacco: Former    Types: Chew   Tobacco comments:  Stopped approximately 10 years ago.  Vaping Use   Vaping status: Never Used  Substance and Sexual Activity   Alcohol use: Yes    Alcohol/week: 2.0 standard drinks of alcohol    Types: 2 Cans of beer per week    Comment: occasionally   Drug use: Never   Sexual activity: Not Currently    Birth control/protection: None  Other Topics Concern   Not on file  Social History Narrative    lives in [place]; with oldest son. Wife died in 2018-08-18.  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 Pt still drives and is indepent with no serious chronic disease. Worked in [employer]    Social Drivers of Health   Financial Resource Strain: Low Risk  (02/08/2023)   Overall Financial Resource Strain (CARDIA)    Difficulty of Paying Living Expenses: Not hard at all  Food  Insecurity: No Food Insecurity (02/08/2023)   Hunger Vital Sign    Worried About Running Out of Food in the Last Year: Never true    Ran Out of Food in the Last Year: Never true  Transportation Needs: No Transportation Needs (02/08/2023)   PRAPARE - Administrator, Civil Service (Medical): No    Lack of Transportation (Non-Medical): No  Physical Activity: Insufficiently Active (02/08/2023)   Exercise Vital Sign    Days of Exercise per Week: 3 days    Minutes of Exercise per Session: 30 min  Stress: No Stress Concern Present (02/08/2023)   [employer] of Occupational Health - Occupational Stress Questionnaire    Feeling of Stress : Only a little  Social Connections: Socially Isolated (02/08/2023)   Social Connection and Isolation Panel [NHANES]    Frequency of Communication with Friends and Family: More than three times a week    Frequency of Social Gatherings with Friends and Family: More than three times a week    Attends Religious Services: Never    Database administrator or Organizations: No    Attends Banker Meetings: Never    Marital Status: Widowed  Intimate Partner Violence: Not At Risk (02/08/2023)   Humiliation, Afraid, Rape, and Kick questionnaire    Fear of Current or Ex-Partner: No    Emotionally Abused: No    Physically Abused: No    Sexually Abused: No     Constitutional: Denies fever, malaise, fatigue, headache or abrupt weight changes.  HEENT: Pt reports nasal congestion. Denies eye pain, eye redness, ear pain, ringing in the ears, wax buildup, runny nose, bloody nose, or sore throat. Respiratory: Pt reports cough. Denies difficulty breathing, shortness of breath.   Cardiovascular: Denies chest pain, chest tightness, palpitations or swelling in the hands or feet.  Gastrointestinal: Pt reports difficulty swallowing, intermittent constipation. Denies abdominal pain, bloating, diarrhea or blood in the stool.  GU: Denies urgency, frequency,  pain with urination, burning sensation, blood in urine, odor or discharge. Musculoskeletal: Denies decrease in range of motion, difficulty with gait, muscle pain or joint pain and swelling.  Skin: Denies redness, rashes, lesions or ulcercations.  Neurological: Pt reports intermittent dizziness. Denies difficulty with memory, difficulty with speech or problems with balance and coordination.  Psych: Patient has a history of anxiety and depression.  Denies SI/HI.  No other specific complaints in a complete review of systems (except as listed in HPI above).      Objective:   Physical Exam  .BP 122/64 (BP Location: Left Arm, Patient  Position: Sitting, Cuff Size: Normal)   Ht 5\' 8"  (1.727 m)   Wt 124 lb 9.6 oz (56.5 kg)   BMI 18.95 kg/m    Wt Readings from Last 3 Encounters:  06/07/23 123 lb 6.4 oz (56 kg)  05/16/23 130 lb (59 kg)  03/14/23 129 lb 3.2 oz (58.6 kg)    General: Appears his stated age, well developed, well nourished in NAD. Skin: Warm, dry and intact.  HEENT: Head: normal shape and size, no sinus tenderness noted; Eyes: sclera white, no icterus, conjunctiva pink, PERRLA and EOMs intact; Nose: Mucosa pink and moist, septum midline; Throat: Mucosa pink and moist, + PND, no exudate, lesions or ulcerations noted. Neck: No adenopathy noted. Cardiovascular: Normal rate and rhythm.  Pulmonary/Chest: Normal effort and positive vesicular breath sounds. No respiratory distress. No wheezes, rales or ronchi noted.  Neurological: Let side facial droop noted (chronic). Alert and oriented.   BMET    Component Value Date/Time   NA 142 06/07/2023 1257   K 4.7 06/07/2023 1257   CL 108 06/07/2023 1257   CO2 25 06/07/2023 1257   GLUCOSE 87 06/07/2023 1257   BUN 34 (H) 06/07/2023 1257   CREATININE 2.17 (H) 06/07/2023 1257   CALCIUM 9.7 06/07/2023 1257   GFRNONAA 27 (L) 03/14/2023 1302   GFRAA 29 (L) 03/04/2020 0801    Lipid Panel     Component Value Date/Time   CHOL 160  06/07/2023 1257   TRIG 142 06/07/2023 1257   HDL 61 06/07/2023 1257   CHOLHDL 2.6 06/07/2023 1257   LDLCALC 76 06/07/2023 1257    CBC    Component Value Date/Time   WBC 10.9 (H) 06/07/2023 1257   RBC 3.36 (L) 06/07/2023 1257   HGB 10.7 (L) 06/07/2023 1257   HGB 11.1 (L) 03/14/2023 1302   HCT 33.0 (L) 06/07/2023 1257   PLT 277 06/07/2023 1257   PLT 232 03/14/2023 1302   MCV 98.2 06/07/2023 1257   MCH 31.8 06/07/2023 1257   MCHC 32.4 06/07/2023 1257   RDW 11.9 06/07/2023 1257   LYMPHSABS 1.6 03/14/2023 1302   MONOABS 0.8 03/14/2023 1302   EOSABS 0.6 (H) 03/14/2023 1302   BASOSABS 0.1 03/14/2023 1302    Hgb A1C Lab Results  Component Value Date   HGBA1C 5.2 06/07/2023            Assessment & Plan:   Assessment and Plan    Nasal congestion, Postnasal Drip Nasal congestion and cough. No fever, vomiting, diarrhea, or shortness of breath. Lungs clear on auscultation. -Prescribe Atrovent nasal spray, use four times daily. -Advise patient to follow up if symptoms do not improve or worsen.     RTC in 4 months, follow-up chronic conditions Nicki Reaper, NP

## 2023-07-31 NOTE — Patient Instructions (Signed)
 Postnasal Drip Postnasal drip is the feeling of mucus going down the back of your throat. Mucus is a slimy substance that moistens and cleans your nose and throat, as well as the air pockets in face bones near your forehead and cheeks (sinuses). Small amounts of mucus pass from your nose and sinuses down the back of your throat all the time. This is normal. When you produce too much mucus or the mucus gets too thick, you can feel it. Some common causes of postnasal drip include: Having more mucus because of: A cold or the flu. Allergies. Cold air. Certain medicines. Gastroesophageal reflux. Having more mucus that is thicker because of: A sinus or nasal infection. Dry air. A food allergy. Follow these instructions at home: Relieving discomfort  Gargle with a mixture of salt and water 3-4 times a day or as needed. To make salt water, completely dissolve -1 tsp (3-6 g) of salt in 1 cup (237 mL) of warm water. If the air in your home is dry, use a humidifier to add moisture to the air. Use a saline spray or a container (neti pot) to flush out the nose (nasal irrigation). These methods can help clear away mucus and keep the nasal passages moist. General instructions Take over-the-counter and prescription medicines only as told by your health care provider. Follow instructions from your health care provider about eating or drinking restrictions. You may need to avoid caffeine. Avoid things that you know you are allergic to (allergens), like dust, mold, pollen, pets, or certain foods. Drink enough fluid to keep your urine pale yellow. Keep all follow-up visits. This is important. Contact a health care provider if: You have a fever. You have a sore throat or difficulty swallowing. You have a headache. You have sinus or ear pain. You have a cough that does not go away. The mucus from your nose becomes thick and is green or yellow in color. You have cold or flu symptoms that last more than 10  days. Summary Postnasal drip is the feeling of mucus going down the back of your throat. Use nasal irrigation or a nasal spray to help clear away mucus and keep the nasal passages moist. Avoid things that you know you are allergic to (allergens), like dust, mold, pollen, pets, or certain foods. This information is not intended to replace advice given to you by your health care provider. Make sure you discuss any questions you have with your health care provider. Document Revised: 04/28/2021 Document Reviewed: 04/28/2021 Elsevier Patient Education  2024 [employer].

## 2023-07-31 NOTE — Telephone Encounter (Signed)
LM for son to call back

## 2023-07-31 NOTE — Telephone Encounter (Signed)
Copied from CRM [id number]. Topic: General - Inquiry >> Jul 31, 2023 10:49 AM Clide Dales wrote: Patient's son called to speak with nurse about today's visit. Please advise.

## 2023-08-01 ENCOUNTER — Ambulatory Visit: Payer: Self-pay | Admitting: Internal Medicine

## 2023-08-01 NOTE — Telephone Encounter (Signed)
Him and his dad need to get on the same page.  The patient himself told me that he did not overtly feel bad, that his symptoms had improved and that his only concern was nasal congestion.  There was no evidence of any bronchitis or pneumonia on exam.  He had a little postnasal drip in the back of his throat.  He has not taken the nasal spray long left even know if it is effective.  If I had seen any concerns of bacterial infection I would have placed him on antibiotics and possibly steroids.

## 2023-08-01 NOTE — Telephone Encounter (Signed)
Copied from CRM [id number]. Topic: Clinical - Red Word Triage >> Aug 01, 2023  8:05 AM Elle L wrote: Red Word that prompted transfer to Nurse Triage: The patient's son. Gustavus Bryant, states that the patient was seen yesterday but was prescribed a nasal spray that is not helping. He has a chest cold that has lasted 8 days. He has cold chills, body aches and pains, and is coughing up phelgm.   Patient's son Casimiro Needle called and stated that patient was seen in the office yesterday. He is concerned that only a nasal spray was prescribed for patient. He stated that patient has also been experiencing a chest cold/congestion and cough but nothing was prescribed for those symptoms. He is requesting for additional treatment to be prescribed.

## 2023-08-01 NOTE — Telephone Encounter (Signed)
Spoke with patient he stated he feels better today then yesterday when he was in the office.

## 2023-08-06 ENCOUNTER — Encounter: Payer: Self-pay | Admitting: Internal Medicine

## 2023-08-06 ENCOUNTER — Ambulatory Visit (INDEPENDENT_AMBULATORY_CARE_PROVIDER_SITE_OTHER): Payer: Medicare Other | Admitting: Internal Medicine

## 2023-08-06 VITALS — BP 118/60 | Temp 98.4°F | Ht 68.0 in | Wt 124.0 lb

## 2023-08-06 DIAGNOSIS — J00 Acute nasopharyngitis [common cold]: Secondary | ICD-10-CM

## 2023-08-06 DIAGNOSIS — J410 Simple chronic bronchitis: Secondary | ICD-10-CM

## 2023-08-06 LAB — POC COVID19/FLU A&B COMBO
Covid Antigen, POC: NEGATIVE
Influenza A Antigen, POC: NEGATIVE
Influenza B Antigen, POC: NEGATIVE

## 2023-08-06 MED ORDER — BREZTRI AEROSPHERE 160-9-4.8 MCG/ACT IN AERO: 2.0000 | INHALATION_SPRAY | Freq: Two times a day (BID) | RESPIRATORY_TRACT | Status: DC

## 2023-08-06 MED ORDER — AMOXICILLIN-POT CLAVULANATE 875-125 MG PO TABS: 1.0000 | ORAL_TABLET | Freq: Two times a day (BID) | ORAL | 0 refills | Status: DC

## 2023-08-06 MED ORDER — BREZTRI AEROSPHERE 160-9-4.8 MCG/ACT IN AERO: 2.0000 | INHALATION_SPRAY | Freq: Two times a day (BID) | RESPIRATORY_TRACT | 5 refills | Status: DC

## 2023-08-06 NOTE — Patient Instructions (Signed)

## 2023-08-06 NOTE — Progress Notes (Signed)
 Subjective:    Patient ID: Edward Cummings, male    DOB: 05/17/1933, 89 y.o.   MRN: [id number]  HPI  Discussed the use of AI scribe software for clinical note transcription with the patient, who gave verbal consent to proceed.   Edward Cummings is an 89 year old male with COPD who presents with a persistent cough and chest congestion.  He has experienced a persistent cough and chest congestion for approximately nine days. The symptoms began after a cold, which initially improved, leaving only nasal congestion. Subsequently, he developed significant coughing, leading to rib pain. Nasal discharge varies between clear and yellow, and the cough occasionally produces yellow sputum. He experiences some shortness of breath. No headache, sore throat, ear pain, vomiting, or diarrhea.  He has a history of COPD, identified on a previous chest x-ray, and was unaware of this diagnosis until recently. He is not currently using an inhaler as his previous one (combivent) was broken. He has a history of smoking, which contributes to his COPD.  He is currently on daily prednisone and has been using a nasal spray (atrovent), which he finds effective. He has also been taking Mucinex, though he feels these have not been effective in alleviating his symptoms.       Review of Systems   Past Medical History:  Diagnosis Date   Anemia    Anxiety 12/31/2019   Cancer [facility])    bladder   Chronic kidney disease    Depression    Hypertension    Neuromuscular disorder (HCC)    Nerve damage to left face/eye since around 2002.    Current Outpatient Medications  Medication Sig Dispense Refill   amLODipine (NORVASC) 10 MG tablet TAKE ONE TABLET BY MOUTH ONCE DAILY 90 tablet 1   aspirin EC 81 MG tablet Take 81 mg by mouth daily.     atorvastatin (LIPITOR) 10 MG tablet TAKE ONE TABLET (10 MG TOTAL) BY MOUTH DAILY. 90 tablet 1   ipratropium (ATROVENT) 0.06 % nasal spray Place 2 sprays into both nostrils 4 (four)  times daily. For up to 5-7 days then stop. 15 mL 0   Ipratropium-Albuterol (COMBIVENT RESPIMAT) 20-100 MCG/ACT AERS respimat Inhale 1 puff into the lungs every 6 (six) hours as needed for wheezing or shortness of breath. 4 g 0   levothyroxine (SYNTHROID) 75 MCG tablet TAKE ONE TABLET BY MOUTH EVERY MORNING BEFORE BREAKFAST 30 tablet 3   loratadine (CLARITIN) 10 MG tablet TAKE 1 TABLET BY MOUTH ONCE A DAY 30 tablet 11   mometasone (NASONEX) 50 MCG/ACT nasal spray PLACE TWO SPRAYS INTO THE NOSE DAILY 17 g 3   Multiple Vitamin (MULTIVITAMIN WITH MINERALS) TABS tablet Take 1 tablet by mouth daily.      omeprazole (PRILOSEC) 40 MG capsule Take 1 capsule (40 mg total) by mouth daily. 90 capsule 1   ondansetron (ZOFRAN) 4 MG tablet Take 1 tablet (4 mg total) by mouth daily as needed for nausea or vomiting. (Patient not taking: Reported on 07/31/2023) 30 tablet 0   prednisoLONE acetate (PRED FORTE) 1 % ophthalmic suspension Place 1 drop into both eyes daily at 2 PM. 5 mL 4   predniSONE (DELTASONE) 5 MG tablet TAKE ONE TABLET BY MOUTH ONCE A DAY WITH BREAKFAST. 60 tablet 1   No current facility-administered medications for this visit.    No Known Allergies  Family History  Problem Relation Age of Onset   Prostate cancer Neg Hx    Kidney cancer  Neg Hx    Bladder Cancer Neg Hx    Cancer - Prostate Neg Hx     Social History   Socioeconomic History   Marital status: Widowed    Spouse name: Talbert Forest   Number of children: 2   Years of education: 6th grade   Highest education level: 6th grade  Occupational History   Not on file  Tobacco Use   Smoking status: Former    Current packs/day: 0.00    Types: Cigarettes    Quit date: August 27, 2008    Years since quitting: 15.1   Smokeless tobacco: Former    Types: Chew   Tobacco comments:    Stopped approximately 10 years ago.  Vaping Use   Vaping status: Never Used  Substance and Sexual Activity   Alcohol use: Yes    Alcohol/week: 2.0 standard drinks  of alcohol    Types: 2 Cans of beer per week    Comment: occasionally   Drug use: Never   Sexual activity: Not Currently    Birth control/protection: None  Other Topics Concern   Not on file  Social History Narrative    lives in [place]; with oldest son. Wife died in 08/28/18.  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 Pt still drives and is indepent with no serious chronic disease. Worked in [employer]    Social Drivers of Health   Financial Resource Strain: Low Risk  (02/08/2023)   Overall Financial Resource Strain (CARDIA)    Difficulty of Paying Living Expenses: Not hard at all  Food Insecurity: No Food Insecurity (02/08/2023)   Hunger Vital Sign    Worried About Running Out of Food in the Last Year: Never true    Ran Out of Food in the Last Year: Never true  Transportation Needs: No Transportation Needs (02/08/2023)   PRAPARE - Administrator, Civil Service (Medical): No    Lack of Transportation (Non-Medical): No  Physical Activity: Insufficiently Active (02/08/2023)   Exercise Vital Sign    Days of Exercise per Week: 3 days    Minutes of Exercise per Session: 30 min  Stress: No Stress Concern Present (02/08/2023)   [employer] of Occupational Health - Occupational Stress Questionnaire    Feeling of Stress : Only a little  Social Connections: Socially Isolated (02/08/2023)   Social Connection and Isolation Panel [NHANES]    Frequency of Communication with Friends and Family: More than three times a week    Frequency of Social Gatherings with Friends and Family: More than three times a week    Attends Religious Services: Never    Database administrator or Organizations: No    Attends Banker Meetings: Never    Marital Status: Widowed  Intimate Partner Violence: Not At Risk (02/08/2023)   Humiliation, Afraid, Rape, and Kick questionnaire    Fear of Current or Ex-Partner: No    Emotionally  Abused: No    Physically Abused: No    Sexually Abused: No     Constitutional: Denies fever, malaise, fatigue, headache or abrupt weight changes.  HEENT: Pt reports sinus pressure, nasal congestion. Denies eye pain, eye redness, ear pain, ringing in the ears, wax buildup, runny nose, bloody nose, or sore throat. Respiratory: Pt reports cough and  shortness of breath. Denies difficulty breathing.   Cardiovascular: Denies chest pain, chest tightness, palpitations or swelling in the hands or feet.  Gastrointestinal: Pt reports difficulty swallowing, intermittent  constipation. Denies abdominal pain, bloating, diarrhea or blood in the stool.  GU: Denies urgency, frequency, pain with urination, burning sensation, blood in urine, odor or discharge. Musculoskeletal: Denies decrease in range of motion, difficulty with gait, muscle pain or joint pain and swelling.  Skin: Denies redness, rashes, lesions or ulcercations.  Neurological: Pt reports intermittent dizziness. Denies difficulty with memory, difficulty with speech or problems with balance and coordination.  Psych: Patient has a history of anxiety and depression.  Denies SI/HI.  No other specific complaints in a complete review of systems (except as listed in HPI above).      Objective:   Physical Exam  BP 118/60 (BP Location: Left Arm, Patient Position: Sitting, Cuff Size: Normal)   Temp 98.4 F (36.9 C)   Ht 5\' 8"  (1.727 m)   Wt 124 lb (56.2 kg)   BMI 18.85 kg/m    Wt Readings from Last 3 Encounters:  07/31/23 124 lb 9.6 oz (56.5 kg)  06/07/23 123 lb 6.4 oz (56 kg)  05/16/23 130 lb (59 kg)    General: Appears his stated age, chronically ill-appearing, in NAD. Skin: Warm, dry and intact.  HEENT: Head: normal shape and size, no sinus tenderness noted; Eyes: sclera white, no icterus, conjunctiva pink, PERRLA and EOMs intact; Nose: mucosa pink and moist, septum midline; Throat: Mucosa pink and moist, + PND, no exudate, lesions or  ulcerations noted. Neck: No adenopathy noted. Cardiovascular: Normal rate and rhythm.  Pulmonary/Chest: Normal effort and positive vesicular breath sounds. No respiratory distress. No wheezes, rales or ronchi noted.  Neurological: Let side facial droop noted (chronic). Alert and oriented.   BMET    Component Value Date/Time   NA 142 06/07/2023 1257   K 4.7 06/07/2023 1257   CL 108 06/07/2023 1257   CO2 25 06/07/2023 1257   GLUCOSE 87 06/07/2023 1257   BUN 34 (H) 06/07/2023 1257   CREATININE 2.17 (H) 06/07/2023 1257   CALCIUM 9.7 06/07/2023 1257   GFRNONAA 27 (L) 03/14/2023 1302   GFRAA 29 (L) 03/04/2020 0801    Lipid Panel     Component Value Date/Time   CHOL 160 06/07/2023 1257   TRIG 142 06/07/2023 1257   HDL 61 06/07/2023 1257   CHOLHDL 2.6 06/07/2023 1257   LDLCALC 76 06/07/2023 1257    CBC    Component Value Date/Time   WBC 10.9 (H) 06/07/2023 1257   RBC 3.36 (L) 06/07/2023 1257   HGB 10.7 (L) 06/07/2023 1257   HGB 11.1 (L) 03/14/2023 1302   HCT 33.0 (L) 06/07/2023 1257   PLT 277 06/07/2023 1257   PLT 232 03/14/2023 1302   MCV 98.2 06/07/2023 1257   MCH 31.8 06/07/2023 1257   MCHC 32.4 06/07/2023 1257   RDW 11.9 06/07/2023 1257   LYMPHSABS 1.6 03/14/2023 1302   MONOABS 0.8 03/14/2023 1302   EOSABS 0.6 (H) 03/14/2023 1302   BASOSABS 0.1 03/14/2023 1302    Hgb A1C Lab Results  Component Value Date   HGBA1C 5.2 06/07/2023            Assessment & Plan:   Assessment and Plan    Chronic Obstructive Pulmonary Disease (COPD) Newly diagnosed. History of smoking. Currently experiencing a cough and shortness of breath, likely related to COPD exacerbation. Lungs clear on auscultation. -Start inhaler Breztri (2 puffs twice a day, rinse mouth after use). -Continue current prednisone regimen.  Upper Respiratory Infection Symptoms of stuffy nose, cough, and chest discomfort for 9 days. Yellow sputum production. No  fever, headache, or ear pain. Negative  for COVID and flu. -Start Amoxicillin 75-125mg  twice a day for 10 days. -Continue Atrovent nasal spray      RTC in 4 months, follow-up chronic conditions Nicki Reaper, NP

## 2023-08-06 NOTE — Addendum Note (Signed)
 Addended by: Luanna Cole D on: 08/06/2023 09:38 AM   Modules accepted: Orders

## 2023-08-16 ENCOUNTER — Other Ambulatory Visit: Payer: Self-pay | Admitting: Nurse Practitioner

## 2023-08-20 ENCOUNTER — Encounter: Payer: Self-pay | Admitting: Internal Medicine

## 2023-09-03 DIAGNOSIS — D631 Anemia in chronic kidney disease: Secondary | ICD-10-CM | POA: Diagnosis not present

## 2023-09-03 DIAGNOSIS — I10 Essential (primary) hypertension: Secondary | ICD-10-CM | POA: Diagnosis not present

## 2023-09-03 DIAGNOSIS — I129 Hypertensive chronic kidney disease with stage 1 through stage 4 chronic kidney disease, or unspecified chronic kidney disease: Secondary | ICD-10-CM | POA: Diagnosis not present

## 2023-09-03 DIAGNOSIS — R808 Other proteinuria: Secondary | ICD-10-CM | POA: Diagnosis not present

## 2023-09-03 DIAGNOSIS — N281 Cyst of kidney, acquired: Secondary | ICD-10-CM | POA: Diagnosis not present

## 2023-09-03 DIAGNOSIS — N184 Chronic kidney disease, stage 4 (severe): Secondary | ICD-10-CM | POA: Diagnosis not present

## 2023-09-12 ENCOUNTER — Inpatient Hospital Stay: Payer: Medicare HMO | Attending: Internal Medicine

## 2023-09-12 ENCOUNTER — Other Ambulatory Visit: Payer: Self-pay | Admitting: Internal Medicine

## 2023-09-12 ENCOUNTER — Inpatient Hospital Stay: Payer: Medicare HMO | Admitting: Internal Medicine

## 2023-09-12 ENCOUNTER — Encounter: Payer: Self-pay | Admitting: Internal Medicine

## 2023-09-12 ENCOUNTER — Inpatient Hospital Stay: Payer: Medicare HMO

## 2023-09-12 VITALS — BP 119/68 | HR 73 | Temp 96.7°F | Resp 20 | Ht 68.0 in | Wt 121.8 lb

## 2023-09-12 DIAGNOSIS — E032 Hypothyroidism due to medicaments and other exogenous substances: Secondary | ICD-10-CM | POA: Diagnosis not present

## 2023-09-12 DIAGNOSIS — Z79899 Other long term (current) drug therapy: Secondary | ICD-10-CM | POA: Diagnosis not present

## 2023-09-12 DIAGNOSIS — Z7982 Long term (current) use of aspirin: Secondary | ICD-10-CM | POA: Insufficient documentation

## 2023-09-12 DIAGNOSIS — D649 Anemia, unspecified: Secondary | ICD-10-CM | POA: Insufficient documentation

## 2023-09-12 DIAGNOSIS — C772 Secondary and unspecified malignant neoplasm of intra-abdominal lymph nodes: Secondary | ICD-10-CM | POA: Diagnosis present

## 2023-09-12 DIAGNOSIS — N183 Chronic kidney disease, stage 3 unspecified: Secondary | ICD-10-CM | POA: Diagnosis not present

## 2023-09-12 DIAGNOSIS — R0609 Other forms of dyspnea: Secondary | ICD-10-CM | POA: Insufficient documentation

## 2023-09-12 DIAGNOSIS — Z87891 Personal history of nicotine dependence: Secondary | ICD-10-CM | POA: Diagnosis not present

## 2023-09-12 DIAGNOSIS — I129 Hypertensive chronic kidney disease with stage 1 through stage 4 chronic kidney disease, or unspecified chronic kidney disease: Secondary | ICD-10-CM | POA: Insufficient documentation

## 2023-09-12 DIAGNOSIS — G8929 Other chronic pain: Secondary | ICD-10-CM | POA: Diagnosis not present

## 2023-09-12 DIAGNOSIS — M545 Low back pain, unspecified: Secondary | ICD-10-CM | POA: Insufficient documentation

## 2023-09-12 DIAGNOSIS — Z8551 Personal history of malignant neoplasm of bladder: Secondary | ICD-10-CM | POA: Diagnosis not present

## 2023-09-12 DIAGNOSIS — C679 Malignant neoplasm of bladder, unspecified: Secondary | ICD-10-CM | POA: Diagnosis present

## 2023-09-12 DIAGNOSIS — Z7952 Long term (current) use of systemic steroids: Secondary | ICD-10-CM | POA: Insufficient documentation

## 2023-09-12 DIAGNOSIS — Z7951 Long term (current) use of inhaled steroids: Secondary | ICD-10-CM | POA: Insufficient documentation

## 2023-09-12 LAB — CBC WITH DIFFERENTIAL (CANCER CENTER ONLY)
Abs Immature Granulocytes: 0.04 K/uL (ref 0.00–0.07)
Basophils Absolute: 0.1 K/uL (ref 0.0–0.1)
Basophils Relative: 1 %
Eosinophils Absolute: 0.5 K/uL (ref 0.0–0.5)
Eosinophils Relative: 5 %
HCT: 33.7 % — ABNORMAL LOW (ref 39.0–52.0)
Hemoglobin: 10.6 g/dL — ABNORMAL LOW (ref 13.0–17.0)
Immature Granulocytes: 0 %
Lymphocytes Relative: 28 %
Lymphs Abs: 3 K/uL (ref 0.7–4.0)
MCH: 31.2 pg (ref 26.0–34.0)
MCHC: 31.5 g/dL (ref 30.0–36.0)
MCV: 99.1 fL (ref 80.0–100.0)
Monocytes Absolute: 0.9 K/uL (ref 0.1–1.0)
Monocytes Relative: 8 %
Neutro Abs: 6.1 K/uL (ref 1.7–7.7)
Neutrophils Relative %: 58 %
Platelet Count: 280 K/uL (ref 150–400)
RBC: 3.4 MIL/uL — ABNORMAL LOW (ref 4.22–5.81)
RDW: 13.8 % (ref 11.5–15.5)
WBC Count: 10.5 K/uL (ref 4.0–10.5)
nRBC: 0 % (ref 0.0–0.2)

## 2023-09-12 LAB — IRON AND TIBC
Iron: 88 ug/dL (ref 45–182)
Saturation Ratios: 33 % (ref 17.9–39.5)
TIBC: 270 ug/dL (ref 250–450)
UIBC: 182 ug/dL

## 2023-09-12 LAB — CMP (CANCER CENTER ONLY)
ALT: 22 U/L (ref 0–44)
AST: 23 U/L (ref 15–41)
Albumin: 3.8 g/dL (ref 3.5–5.0)
Alkaline Phosphatase: 72 U/L (ref 38–126)
Anion gap: 8 (ref 5–15)
BUN: 32 mg/dL — ABNORMAL HIGH (ref 8–23)
CO2: 23 mmol/L (ref 22–32)
Calcium: 9 mg/dL (ref 8.9–10.3)
Chloride: 108 mmol/L (ref 98–111)
Creatinine: 2.08 mg/dL — ABNORMAL HIGH (ref 0.61–1.24)
GFR, Estimated: 30 mL/min — ABNORMAL LOW (ref >60)
Glucose, Bld: 88 mg/dL (ref 70–99)
Potassium: 3.9 mmol/L (ref 3.5–5.1)
Sodium: 139 mmol/L (ref 135–145)
Total Bilirubin: 0.5 mg/dL (ref 0.0–1.2)
Total Protein: 7 g/dL (ref 6.5–8.1)

## 2023-09-12 LAB — FERRITIN: Ferritin: 418 ng/mL — ABNORMAL HIGH (ref 24–336)

## 2023-09-12 NOTE — Progress Notes (Signed)
 No venofer today.

## 2023-09-12 NOTE — Progress Notes (Signed)
 [facility] CONSULT NOTE  Patient Care Team: Lorre Munroe, NP as PCP - General (Internal Medicine) Earna Coder, MD as Consulting Physician (Hematology and Oncology) Pasty Spillers, MD (Inactive) as Consulting Physician (Gastroenterology) Geanie Logan, MD as Referring Physician (Otolaryngology) Vida Rigger, MD as Consulting Physician (Pulmonary Disease) Riki Altes, MD (Urology)  CHIEF COMPLAINTS/PURPOSE OF CONSULTATION: Bladder cancer   Oncology History Overview Note  # AUG 2019-TRANSITIONAL CELL BLADDER CA [~ 4cm tumor] s/p cystoscopy [Dr.Stoiff]  with extensive angiolymphatic invasion; [name]; [name]; mediastinal uptake; right pubic rami uptake.  # [id number] Neas; Jan 18th 2021- switched to Opdivo- [pt preference; [name]; July 2020 cystoscopy-no evidence of bladder malignancy/Dr. Lonna Cobb- enlarged prostate [PSA- 0.95; 2021]  # Molecular testing- PDL-1 CPS- 20%; [name]  ;GOALS: palliative  CURRENT/MOST RECENT THERAPY:OPDIVO [C]     History of bladder cancer  02/28/2018 - 06/09/2019 Chemotherapy   The patient had atezolizumab (TECENTRIQ) 1,[name], 1,200 mg, Intravenous, Once, [name],200 mg (02/28/2018), 1,200 mg (03/21/2018), 1,200 mg (04/11/2018), 1,200 mg (05/02/2018), 1,200 mg (06/13/2018), 1,200 mg (05/23/2018), 1,200 mg (07/04/2018), 1,200 mg (07/25/2018), 1,200 mg (08/15/2018), 1,200 mg (09/05/2018), 1,200 mg (10/25/2018), 1,200 mg (11/22/2018), 1,200 mg (12/20/2018), 1,200 mg (01/10/2019), 1,200 mg (01/31/2019), 1,200 mg (02/21/2019), 1,200 mg (03/14/2019), 1,200 mg (04/04/2019), 1,200 mg  (04/25/2019), 1,200 mg (05/16/2019), 1,200 mg (06/09/2019)  for chemotherapy treatment.    06/30/2019 - 06/17/2020 Chemotherapy   Patient is on Treatment Plan : BLADDER Nivolumab q14d      HISTORY OF PRESENTING ILLNESS: Patient ambulating independently.  Alone.   Edward Cummings 89 y.o.  male with metastatic transitional carcinoma of the bladder most recently on Opdivo [currently on hold because of poor tolerance]; currently on surveillance is here for follow-up.  S/p upper GI endoscopy re: dysphagia.  Patient denies any blood in urine.  Denies any headaches or nausea vomiting.  Chronic dyspnea with exertion. Chronic low back pain. Appetite is good. No visible blood in stools. Appetite is good.  No weight loss. Continues to be on prednisone 5 mg a day.   Review of Systems  Constitutional:  Positive for malaise/fatigue. Negative for chills, diaphoresis and fever.  HENT:  Negative for nosebleeds and sore throat.   Eyes:  Negative for double vision.  Respiratory:  Negative for hemoptysis, sputum production and wheezing.   Cardiovascular:  Negative for chest pain, palpitations, orthopnea and leg swelling.  Gastrointestinal:  Negative for abdominal pain, blood in stool, diarrhea, heartburn, melena, nausea and vomiting.  Genitourinary:  Negative for dysuria.  Musculoskeletal:  Positive for back pain and joint pain.  Skin: Negative.  Negative for itching and rash.  Neurological:  Negative for tingling, focal weakness and headaches.  Endo/Heme/Allergies:  Does not bruise/bleed easily.  Psychiatric/Behavioral:  Negative for depression. The patient is not nervous/anxious and does not have insomnia.      MEDICAL HISTORY:  Past Medical History:  Diagnosis Date   Anemia    Anxiety 12/31/2019   Cancer [facility])    bladder   Chronic kidney disease    Depression    Hypertension    Neuromuscular disorder (HCC)  Nerve damage to left face/eye since around 29-Sep-2000.    SURGICAL HISTORY: Past Surgical  History:  Procedure Laterality Date   BIOPSY  03/08/2023   Procedure: BIOPSY;  Surgeon: [name], MD;  Location: [facility] ENDOSCOPY;  Service: Gastroenterology;;   Bluford Kaufmann W/ RETROGRADES Bilateral 01/25/2018   Procedure: CYSTOSCOPY WITH RETROGRADE PYELOGRAM;  Surgeon: Riki Altes, MD;  [name];  Service: Urology;  Laterality: Bilateral;   CYSTOSCOPY W/ URETERAL STENT PLACEMENT Bilateral 01/06/2019   Procedure: CYSTOSCOPY WITH RETROGRADE PYELOGRAM/URETERAL STENT REMOVAL;  Surgeon: Riki Altes, MD;  [name];  Service: Urology;  Laterality: Bilateral;   CYSTOSCOPY WITH BIOPSY N/A 09/21/2020   Procedure: CYSTOSCOPY WITH BLADDER BIOPSY;  Surgeon: Riki Altes, MD;  [name];  Service: Urology;  [name];   CYSTOSCOPY WITH FULGERATION N/A 09/21/2020   Procedure: CYSTOSCOPY WITH FULGERATION;  Surgeon: Riki Altes, MD;  [name];  Service: Urology;  [name];   CYSTOSCOPY WITH STENT PLACEMENT Bilateral 01/25/2018   Procedure: CYSTOSCOPY WITH STENT PLACEMENT;  Surgeon: Riki Altes, MD;  [name];  Service: Urology;  Laterality: Bilateral;   DORSAL SLIT N/A 01/06/2019   Procedure: DORSAL SLIT;  Surgeon: Riki Altes, MD;  [name];  Service: Urology;  [name];   ESOPHAGOGASTRODUODENOSCOPY (EGD) WITH PROPOFOL N/A 11/12/2019   Procedure: ESOPHAGOGASTRODUODENOSCOPY (EGD) WITH PROPOFOL;  Surgeon: Pasty Spillers, MD;  Location: [facility] ENDOSCOPY;  Service: Endoscopy;  [name];   ESOPHAGOGASTRODUODENOSCOPY (EGD) WITH PROPOFOL N/A 03/08/2023   Procedure: ESOPHAGOGASTRODUODENOSCOPY (EGD) WITH PROPOFOL;  Surgeon: [name], MD;  Location: [facility] ENDOSCOPY;  Service: Gastroenterology;  [name];   EYE SURGERY     Cornea transplants bilaterally & cataract surgery.   TRANSURETHRAL RESECTION OF BLADDER TUMOR N/A 01/25/2018   Procedure: TRANSURETHRAL RESECTION OF BLADDER TUMOR (TURBT);  Surgeon:  Riki Altes, MD;  [name];  Service: Urology;  [name];    SOCIAL HISTORY: Social History   Socioeconomic History   Marital status: Widowed    Spouse name: Talbert Forest   Number of children: 2   Years of education: 6th grade   Highest education level: 6th grade  Occupational History   Not on file  Tobacco Use   Smoking status: Former    Current packs/day: 0.00    Types: Cigarettes    Quit date: 2008-09-29    Years since quitting: 15.2   Smokeless tobacco: Former    Types: Chew   Tobacco comments:    Stopped approximately 10 years ago.  Vaping Use   Vaping status: Never Used  Substance and Sexual Activity   Alcohol use: Yes    Alcohol/week: 2.0 standard drinks of alcohol    Types: 2 Cans of beer per week    Comment: occasionally   Drug use: Never   Sexual activity: Not Currently    Birth control/protection: None  Other Topics Concern   Not on file  Social History Narrative    lives in [place]; with oldest son. Wife died in 30-Sep-2018.  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 Pt still drives and is indepent with no serious chronic disease. Worked in [employer]    Social Drivers of Health   Financial Resource Strain: Low Risk  (02/08/2023)   Overall Financial Resource Strain (CARDIA)    Difficulty of Paying Living Expenses: Not hard at all  Food Insecurity: No Food Insecurity (02/08/2023)   Hunger Vital  Sign    Worried About Programme researcher, broadcasting/film/video in the Last Year: Never true    Ran Out of Food in the Last Year: Never true  Transportation Needs: No Transportation Needs (02/08/2023)   PRAPARE - Administrator, Civil Service (Medical): No    Lack of Transportation (Non-Medical): No  Physical Activity: Insufficiently Active (02/08/2023)   Exercise Vital Sign    Days of Exercise per Week: 3 days    Minutes of Exercise per Session: 30 min  Stress: No Stress Concern Present (02/08/2023)   [employer] of Occupational Health - Occupational Stress Questionnaire    Feeling of Stress : Only a little  Social Connections: Socially Isolated (02/08/2023)   Social Connection and Isolation Panel [NHANES]    Frequency of Communication with Friends and Family: More than three times a week    Frequency of Social Gatherings with Friends and Family: More than three times a week    Attends Religious Services: Never    Database administrator or Organizations: No    Attends Banker Meetings: Never    Marital Status: Widowed  Intimate Partner Violence: Not At Risk (02/08/2023)   Humiliation, Afraid, Rape, and Kick questionnaire    Fear of Current or Ex-Partner: No    Emotionally Abused: No    Physically Abused: No    Sexually Abused: No    FAMILY HISTORY: Family History  Problem Relation Age of Onset   Prostate cancer Neg Hx    Kidney cancer Neg Hx    Bladder Cancer Neg Hx    Cancer - Prostate Neg Hx     ALLERGIES:  has no known allergies.  MEDICATIONS:  Current Outpatient Medications  Medication Sig Dispense Refill   amLODipine (NORVASC) 10 MG tablet TAKE ONE TABLET BY MOUTH ONCE DAILY (Patient taking differently: Take 5 mg by mouth daily.) 90 tablet 1   amoxicillin-clavulanate (AUGMENTIN) 875-125 MG tablet Take 1 tablet by mouth 2 (two) times daily. 20 tablet 0   aspirin EC 81 MG tablet Take 81 mg by mouth daily.     atorvastatin (LIPITOR) 10 MG tablet TAKE ONE TABLET (10 MG TOTAL) BY MOUTH DAILY. 90 tablet 1   Budeson-Glycopyrrol-Formoterol (BREZTRI AEROSPHERE) 160-9-4.8 MCG/ACT AERO Inhale 2 puffs into the lungs 2 (two) times daily. 10.7 g 5   Budeson-Glycopyrrol-Formoterol (BREZTRI AEROSPHERE) 160-9-4.8 MCG/ACT AERO Inhale 2 puffs into the lungs 2 (two) times daily.     ipratropium (ATROVENT) 0.06 % nasal spray Place 2 sprays into both nostrils 4 (four) times daily. For up to 5-7 days then stop. 15 mL 0   levothyroxine (SYNTHROID) 75 MCG tablet TAKE ONE TABLET BY  MOUTH EVERY MORNING BEFORE BREAKFAST 30 tablet 3   loratadine (CLARITIN) 10 MG tablet TAKE 1 TABLET BY MOUTH ONCE A DAY 30 tablet 11   Multiple Vitamin (MULTIVITAMIN WITH MINERALS) TABS tablet Take 1 tablet by mouth daily.      omeprazole (PRILOSEC) 40 MG capsule Take 1 capsule (40 mg total) by mouth daily. 90 capsule 1   prednisoLONE acetate (PRED FORTE) 1 % ophthalmic suspension Place 1 drop into both eyes daily at 2 PM. 5 mL 4   predniSONE (DELTASONE) 5 MG tablet TAKE ONE TABLET BY MOUTH ONCE A DAY WITH BREAKFAST. 60 tablet 1   mometasone (NASONEX) 50 MCG/ACT nasal spray PLACE TWO SPRAYS INTO THE NOSE DAILY (Patient not taking: Reported on 09/12/2023) 17 g 3   No current facility-administered medications for this visit.      Marland Kitchen  PHYSICAL EXAMINATION: ECOG PERFORMANCE STATUS: 1 - Symptomatic but completely ambulatory  Vitals:   09/12/23 1032  BP: 119/68  Pulse: 73  Resp: 20  Temp: (!) 96.7 F (35.9 C)  SpO2: 100%     Filed Weights   09/12/23 1032  Weight: 121 lb 12.8 oz (55.2 kg)      Physical Exam HENT:     Head: Normocephalic and atraumatic.     Mouth/Throat:     Pharynx: No oropharyngeal exudate.  Eyes:     Pupils: Pupils are equal, round, and reactive to light.     Comments: Chronic drooping of the left eyelid.  Cardiovascular:     Rate and Rhythm: Normal rate and regular rhythm.  Pulmonary:     Effort: No respiratory distress.     Breath sounds: No wheezing.  Abdominal:     General: Bowel sounds are normal. There is no distension.     Palpations: Abdomen is soft. There is no mass.     Tenderness: There is no abdominal tenderness. There is no guarding or rebound.  Musculoskeletal:        General: No tenderness. Normal range of motion.     Cervical back: Normal range of motion and neck supple.  Skin:    General: Skin is warm.  Neurological:     Mental Status: He is alert and oriented to person, place, and time.  Psychiatric:        Mood and Affect: Affect  normal.      LABORATORY DATA:  I have reviewed the data as listed Lab Results  Component Value Date   WBC 10.5 09/12/2023   HGB 10.6 (L) 09/12/2023   HCT 33.7 (L) 09/12/2023   MCV 99.1 09/12/2023   PLT 280 09/12/2023   Recent Labs    01/02/23 2014 02/27/23 1712 03/14/23 1302 06/07/23 1257 09/12/23 1025  NA 135 139 136 142 139  K 4.7 4.4 4.2 4.7 3.9  CL 105 102 104 108 108  CO2 22 26 24 25 23   GLUCOSE 173* 151* 92 87 88  BUN 42* 34* 29* 34* 32*  CREATININE 2.90* 2.49* 2.25* 2.17* 2.08*  CALCIUM 9.0 9.9 9.1 9.7 9.0  GFRNONAA 20* 24* 27*  --  30*  PROT 6.7  --  6.8 6.7 7.0  ALBUMIN 3.9  --  4.0  --  3.8  AST 17  --  19 15 23   ALT 14  --  17 15 22   ALKPHOS 48  --  55  --  72  BILITOT 0.7  --  0.6 0.5 0.5    RADIOGRAPHIC STUDIES: I have personally reviewed the radiological images as listed and agreed with the findings in the report. No results found.   ASSESSMENT & PLAN:   History of bladder cancer # High-grade transitional cell carcinoma of the bladder metastatic to retroperitoneal lymph node.   Stage IV- MARCH 26 th, 2024- No significant interval change; No developing new mass lesion, fluid collection or lymph node enlargement.  Last immunotherapy infusion January 2022.  Continue surveillance at this time.  Plan imaging only if patient is symptomatic.stable.   # History of local recurrent bladder ca < 1cm agree with TURBT [march 2022] [Dr.Stoiof-cystoscopy NOV 2023]-APRIl 2024 s/p evaluation with urology-negative for recurrence stable.   # Hiatal hernia: S/p upper GI endoscopy re: dysphagia [Dr.Anna]- stable.   # Pulmonary: SEP 2022- CT-emphysema /bronchial thickening /bilateral basilar groundglass opacities -?  Resolving  BOOP/ fibrosis Vs. fungal infection [s/p itraconazole; Dr.Aleskerov];  pleural plaques noted-CT scan March 2024 Stable.      # Iatrogenic hypothyroidism- on Synthroid; 100 mcg wll repeat today-awaiting labs today. Stable.   # Anemia sec to  CKD-III-IV/ on Iv iron.Hb-10-11- Clinically- Stable.recommend PO iron. Hold Venofer today.  # CKD stageIII-IV GFR- 30  -Clinically   stable. recommend increase fluid intake.  #Weight loss improving; continue prednisone for appetite/also above pneumonitis- 5mg  /day- Stable.   # IV access: NO port/PIV  mychart-Thyroid  # DISPOSITION:  # HOLD venofer  # Follow-up in 6  months-;MD; cbc/cmp;iron studies;ferritin;  thyoid panel ;possible venofer - Dr.B  # I reviewed the blood work- with the patient in detail; also reviewed the imaging independently [as summarized above]; and with the patient in detail.       All questions were answered. The patient knows to call the clinic with any problems, questions or concerns.    Earna Coder, MD 09/12/2023 1:37 PM

## 2023-09-12 NOTE — Progress Notes (Signed)
 Fatigue/weakness:  YES Dyspena: NO Light headedness: NO [name]: NO     Dr. Thedore Mins For bladder reduced amoldipine to 5 mg per day, because he staggered when he stood up, is this ok?

## 2023-09-12 NOTE — Patient Instructions (Signed)
#  Recommend gentle iron [iron biglycinate; 28 mg ] 1 pill a day.  This pill is unlikely to cause stomach upset or cause constipation.

## 2023-09-12 NOTE — Assessment & Plan Note (Signed)
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   Stage IV- MARCH 26 th, 2024- No significant interval change; No developing new mass lesion, fluid collection or lymph node enlargement.  Last immunotherapy infusion January 2022.  Continue surveillance at this time.  Plan imaging only if patient is symptomatic.stable.   # History of local recurrent bladder ca < 1cm agree with TURBT [march 2022] [Dr.Stoiof-cystoscopy NOV 2023]-APRIl 2024 s/p evaluation with urology-negative for recurrence stable.   # Hiatal hernia: S/p upper GI endoscopy re: dysphagia [Dr.Anna]- stable.   # Pulmonary: SEP 2022- CT-emphysema /bronchial thickening /bilateral basilar groundglass opacities -?  Resolving  BOOP/ fibrosis Vs. fungal infection [s/p itraconazole; Dr.Aleskerov]; pleural plaques noted-CT scan March 2024 Stable.      # Iatrogenic hypothyroidism- on Synthroid; 100 mcg wll repeat today-awaiting labs today. Stable.   # Anemia sec to CKD-III-IV/ on Iv iron.Hb-10-11- Clinically- Stable.recommend PO iron. Hold Venofer today.  # CKD stageIII-IV GFR- 30  -Clinically   stable. recommend increase fluid intake.  #Weight loss improving; continue prednisone for appetite/also above pneumonitis- 5mg  /day- Stable.   # IV access: NO port/PIV  mychart-Thyroid  # DISPOSITION:  # HOLD venofer  # Follow-up in 6  months-;MD; cbc/cmp;iron studies;ferritin;  thyoid panel ;possible venofer - Dr.B  # I reviewed the blood work- with the patient in detail; also reviewed the imaging independently [as summarized above]; and with the patient in detail.

## 2023-09-13 LAB — THYROID PANEL WITH TSH
Free Thyroxine Index: 2 (ref 1.2–4.9)
T3 Uptake Ratio: 30 % (ref 24–39)
T4, Total: 6.7 ug/dL (ref 4.5–12.0)
TSH: 2.15 u[IU]/mL (ref 0.450–4.500)

## 2023-09-13 NOTE — Telephone Encounter (Signed)
 Requested medications are due for refill today.  yes  Requested medications are on the active medications list.  yes  Last refill. Ophthalmic refilled 10/28/2021. Nasal 07/31/2023   Future visit scheduled.   yes  Notes to clinic.  No protocols attached to either medication refill.    Requested Prescriptions  Pending Prescriptions Disp Refills   prednisoLONE acetate (PRED FORTE) 1 % ophthalmic suspension [Pharmacy Med Name: PREDNISOLONE ACETATE 1% OP SUSPENSION] 5 mL 1    Sig: PLACE ONE DROP INTO EACH EYE DAILY AT TWO PM.     Off-Protocol Failed - 09/13/2023  5:04 PM      Failed - Medication not assigned to a protocol, review manually.      Passed - Valid encounter within last 12 months    Recent Outpatient Visits           1 month ago Simple chronic bronchitis [facility])   [place] [facility] [place], Salvadore Oxford, NP   1 month ago Nasal congestion   [place] [facility] [place], Salvadore Oxford, NP       Future Appointments             In 2 months Baity, Salvadore Oxford, NP [place] [facility]             ipratropium (ATROVENT) 0.06 % nasal spray [Pharmacy Med Name: IPRATROPIUM BROMIDE 0.06% SOLUTION] 15 mL 0    Sig: PLACE TWO SPRAYS INTO BOTH NOSTRILS FOUR (FOUR) TIMES DAILY, FOR UP TO 5-7 DAYS, THEN STOP.     Off-Protocol Failed - 09/13/2023  5:04 PM      Failed - Medication not assigned to a protocol, review manually.      Passed - Valid encounter within last 12 months    Recent Outpatient Visits           1 month ago Simple chronic bronchitis [facility])   [place] [facility], Salvadore Oxford, NP   1 month ago Nasal congestion   [place] [facility] [place], Salvadore Oxford, NP       Future Appointments             In 2 months Sam[place] Si, Salvadore Oxford, NP [place] [facility] [place], [facility]           Off-Protocol Failed - 09/13/2023  5:04 PM      Failed - Medication not assigned to a  protocol, review manually.      Passed - Valid encounter within last 12 months    Recent Outpatient Visits           1 month ago Simple chronic bronchitis [facility])   [place] [facility] [place], Salvadore Oxford, NP   1 month ago Nasal congestion   [place] [facility] [place], Salvadore Oxford, NP       Future Appointments             In 2 months Baity, Salvadore Oxford, NP [facility], [facility]

## 2023-10-03 ENCOUNTER — Encounter: Payer: Self-pay | Admitting: Internal Medicine

## 2023-10-03 ENCOUNTER — Ambulatory Visit (INDEPENDENT_AMBULATORY_CARE_PROVIDER_SITE_OTHER): Admitting: Internal Medicine

## 2023-10-03 ENCOUNTER — Telehealth: Payer: Self-pay

## 2023-10-03 VITALS — BP 124/62 | HR 83 | Ht 68.0 in | Wt 120.0 lb

## 2023-10-03 DIAGNOSIS — J343 Hypertrophy of nasal turbinates: Secondary | ICD-10-CM | POA: Diagnosis not present

## 2023-10-03 DIAGNOSIS — J301 Allergic rhinitis due to pollen: Secondary | ICD-10-CM

## 2023-10-03 MED ORDER — PREDNISONE 10 MG PO TABS: ORAL_TABLET | ORAL | 0 refills | Status: DC

## 2023-10-03 NOTE — Telephone Encounter (Signed)
 Copied from CRM [id number]. Topic: Clinical - Medical Advice >> Oct 03, 2023 11:27 AM Rosamond Comes wrote: Reason for CRM: patient Son Bambi Lever patient care giver calling in. Patient had appt today. Bambi Lever is asking about the appt and what new medication or information was given to the patient.    Bambi Lever would like a nurse to call him regarding this appt Bambi Lever phone  [phone]

## 2023-10-03 NOTE — Patient Instructions (Signed)
 Allergic Rhinitis, Adult  Allergic rhinitis is a reaction to allergens. Allergens are things that can cause an allergic reaction. This condition affects the lining inside the nose (mucous membrane). There are two types of allergic rhinitis: Seasonal. This type is also called hay fever. It happens only during some times of the year. Perennial. This type can happen at any time of the year. This condition cannot be spread from person to person (is not contagious). It can be mild, bad, or very bad. It can develop at any age and may be outgrown. What are the causes? Pollen from grasses, trees, and weeds. Other causes can be: Dust mites. Smoke. Mold. Car fumes. The pee (urine), spit, or dander of pets. Dander is dead skin cells from a pet. What increases the risk? You are more likely to develop this condition if: You have allergies in your family. You have problems like allergies in your family. You may have: Swelling of parts of your eyes and eyelids. Asthma. This affects how you breathe. Long-term redness and swelling on your skin. Food allergies. What are the signs or symptoms? The main symptom of this condition is a runny or stuffy nose (nasal congestion). Other symptoms may include: Sneezing or coughing. Itching and tearing of your eyes. Mucus that drips down the back of your throat (postnasal drip). This may cause a sore throat. Trouble sleeping. Feeling tired. Headache. How is this treated? There is no cure for this condition. You should avoid things that you are allergic to. Treatment can help to relieve symptoms. This may include: Medicines that block allergy symptoms, such as anti-inflammatories or antihistamines. These may be given as a shot, nasal spray, or pill. Avoiding things you are allergic to. Medicines that give you some of what you are allergic to over time. This is called immunotherapy. It is done if other treatments do not help. You may get: Shots. Medicine under  your tongue. Stronger medicines, if other treatments do not help. Follow these instructions at home: Avoiding allergens Find out what things you are allergic to and avoid them. To do this, try these things: If you get allergies any time of year: Replace carpet with wood, tile, or vinyl flooring. Carpet can trap pet dander and dust. Do not smoke. Do not allow smoking in your home. Change your heating and air conditioning filters at least once a month. If you get allergies only some times of the year: Keep windows closed when you can. Plan things to do outside when pollen counts are lowest. Check pollen counts before you plan things to do outside. When you come indoors, change your clothes and shower before you sit on furniture or bedding. If you are allergic to a pet: Keep the pet out of your bedroom. Vacuum, sweep, and dust often. General instructions Take over-the-counter and prescription medicines only as told by your doctor. Drink enough fluid to keep your pee pale yellow. Where to find more information American Academy of Allergy, Asthma & Immunology: aaaai.org Contact a doctor if: You have a fever. You get a cough that does not go away. You make high-pitched whistling sounds when you breathe, most often when you breathe out (wheeze). Your symptoms slow you down. Your symptoms stop you from doing your normal things each day. Get help right away if: You are short of breath. This symptom may be an emergency. Get help right away. Call 911. Do not wait to see if the symptom will go away. Do not drive yourself to the  hospital. This information is not intended to replace advice given to you by your health care provider. Make sure you discuss any questions you have with your health care provider. Document Revised: 02/06/2022 Document Reviewed: 02/06/2022 Elsevier Patient Education  2024 [employer].

## 2023-10-03 NOTE — Progress Notes (Signed)
 Subjective:    Patient ID: Edward Cummings, male    DOB: [birth year], 89 y.o.   MRN: [id number]  HPI  Discussed the use of AI scribe software for clinical note transcription with the patient, who gave verbal consent to proceed.  Edward Cummings is an 89 year old male with COPD who presents with nasal congestion and cough.  He experiences nasal congestion that worsens upon standing and walking, accompanied by a sore throat. Symptoms improve when lying down. No headache or ear pain. Cough is productive, but there is no shortness of breath.  He has a history of COPD and uses a Breztri  inhaler daily. He also uses Nasonex  nasal spray twice daily and takes Claritin  daily for allergies. A similar episode occurred two months ago, which improved after a course of steroids and antibiotics.  No general malaise or sinus tenderness.  He denies fever, chills or bodyaches.  He reports that he does not "feel bad" just cannot get rid of this nasal congestion.      Review of Systems   Past Medical History:  Diagnosis Date   Anemia    Anxiety 12/31/2019   Cancer [facility])    bladder   Chronic kidney disease    Depression    Hypertension    Neuromuscular disorder (HCC)    Nerve damage to left face/eye since around 2002.    Current Outpatient Medications  Medication Sig Dispense Refill   amLODipine  (NORVASC ) 10 MG tablet TAKE ONE TABLET BY MOUTH ONCE DAILY (Patient taking differently: Take 5 mg by mouth daily.) 90 tablet 1   amoxicillin -clavulanate (AUGMENTIN ) 875-125 MG tablet Take 1 tablet by mouth 2 (two) times daily. 20 tablet 0   aspirin  EC 81 MG tablet Take 81 mg by mouth daily.     atorvastatin  (LIPITOR) 10 MG tablet TAKE ONE TABLET (10 MG TOTAL) BY MOUTH DAILY. 90 tablet 1   Budeson-Glycopyrrol-Formoterol (BREZTRI  AEROSPHERE) 160-9-4.8 MCG/ACT AERO Inhale 2 puffs into the lungs 2 (two) times daily. 10.7 g 5   Budeson-Glycopyrrol-Formoterol (BREZTRI  AEROSPHERE) 160-9-4.8 MCG/ACT AERO Inhale 2  puffs into the lungs 2 (two) times daily.     ipratropium (ATROVENT ) 0.06 % nasal spray PLACE TWO SPRAYS INTO BOTH NOSTRILS FOUR (FOUR) TIMES DAILY, FOR UP TO 5-7 DAYS, THEN STOP. 15 mL 0   levothyroxine  (SYNTHROID ) 75 MCG tablet TAKE ONE TABLET BY MOUTH EVERY MORNING BEFORE BREAKFAST 30 tablet 3   loratadine  (CLARITIN ) 10 MG tablet TAKE 1 TABLET BY MOUTH ONCE A DAY 30 tablet 11   mometasone  (NASONEX ) 50 MCG/ACT nasal spray PLACE TWO SPRAYS INTO THE NOSE DAILY (Patient not taking: Reported on 09/12/2023) 17 g 3   Multiple Vitamin (MULTIVITAMIN WITH MINERALS) TABS tablet Take 1 tablet by mouth daily.      omeprazole  (PRILOSEC) 40 MG capsule Take 1 capsule (40 mg total) by mouth daily. 90 capsule 1   prednisoLONE  acetate (PRED FORTE ) 1 % ophthalmic suspension PLACE ONE DROP INTO EACH EYE DAILY AT TWO PM. 5 mL 1   predniSONE  (DELTASONE ) 5 MG tablet TAKE ONE TABLET BY MOUTH ONCE A DAY WITH BREAKFAST. 60 tablet 1   No current facility-administered medications for this visit.    No Known Allergies  Family History  Problem Relation Age of Onset   Prostate cancer Neg Hx    Kidney cancer Neg Hx    Bladder Cancer Neg Hx    Cancer - Prostate Neg Hx     Social History   Socioeconomic History  Marital status: Widowed    Spouse name: Monroe Antigua   Number of children: 2   Years of education: 6th grade   Highest education level: 6th grade  Occupational History   Not on file  Tobacco Use   Smoking status: Former    Current packs/day: 0.00    Types: Cigarettes    Quit date: 11/06/2008    Years since quitting: 15.3   Smokeless tobacco: Former    Types: Chew   Tobacco comments:    Stopped approximately 10 years ago.  Vaping Use   Vaping status: Never Used  Substance and Sexual Activity   Alcohol use: Yes    Alcohol/week: 2.0 standard drinks of alcohol    Types: 2 Cans of beer per week    Comment: occasionally   Drug use: Never   Sexual activity: Not Currently    Birth control/protection: None   Other Topics Concern   Not on file  Social History Narrative    lives in [place]; with oldest son. Wife died in 11-07-18.  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 Pt still drives and is indepent with no serious chronic disease. Worked in [employer]    Social Drivers of Health   Financial Resource Strain: Low Risk  (02/08/2023)   Overall Financial Resource Strain (CARDIA)    Difficulty of Paying Living Expenses: Not hard at all  Food Insecurity: No Food Insecurity (02/08/2023)   Hunger Vital Sign    Worried About Running Out of Food in the Last Year: Never true    Ran Out of Food in the Last Year: Never true  Transportation Needs: No Transportation Needs (02/08/2023)   PRAPARE - Administrator, Civil Service (Medical): No    Lack of Transportation (Non-Medical): No  Physical Activity: Insufficiently Active (02/08/2023)   Exercise Vital Sign    Days of Exercise per Week: 3 days    Minutes of Exercise per Session: 30 min  Stress: No Stress Concern Present (02/08/2023)   [employer] of Occupational Health - Occupational Stress Questionnaire    Feeling of Stress : Only a little  Social Connections: Socially Isolated (02/08/2023)   Social Connection and Isolation Panel [NHANES]    Frequency of Communication with Friends and Family: More than three times a week    Frequency of Social Gatherings with Friends and Family: More than three times a week    Attends Religious Services: Never    Database administrator or Organizations: No    Attends Banker Meetings: Never    Marital Status: Widowed  Intimate Partner Violence: Not At Risk (02/08/2023)   Humiliation, Afraid, Rape, and Kick questionnaire    Fear of Current or Ex-Partner: No    Emotionally Abused: No    Physically Abused: No    Sexually Abused: No     Constitutional: Denies fever, malaise, fatigue, headache or abrupt weight changes.  HEENT: Pt  reports nasal congestion, sore throat. Denies eye pain, eye redness, ear pain, ringing in the ears, wax buildup, runny nose, bloody nose. Respiratory: Pt reports cough. Denies difficulty breathing, shortness of breath.   Cardiovascular: Denies chest pain, chest tightness, palpitations or swelling in the hands or feet.  Gastrointestinal: Denies abdominal pain, bloating, constipation, diarrhea or blood in the stool.   No other specific complaints in a complete review of systems (except as listed in HPI above).      Objective:   Physical Exam  BP 124/62 (BP Location: Left Arm, Patient Position: Sitting, Cuff Size: Normal)   Pulse 83   Ht 5\' 8"  (1.727 m)   Wt 120 lb (54.4 kg)   SpO2 99%   BMI 18.25 kg/m   Wt Readings from Last 3 Encounters:  09/12/23 121 lb 12.8 oz (55.2 kg)  08/06/23 124 lb (56.2 kg)  07/31/23 124 lb 9.6 oz (56.5 kg)    General: Appears his stated age, underweight, in NAD. Skin: Warm, dry and intact. No rashes noted. HEENT: Head: normal shape and size, no sinus tenderness noted; Eyes: sclera white, no icterus, conjunctiva pink, PERRLA and EOMs intact;  Nose: mucosa boggy and moist, turban swollen; Throat/Mouth: Teeth present, mucosa pink and moist, + PND, no exudate, lesions or ulcerations noted.  Neck: No adenopathy noted. Cardiovascular: Normal rate and rhythm.  Pulmonary/Chest: Normal effort and positive vesicular breath sounds. No respiratory distress. No wheezes, rales or ronchi noted.  Musculoskeletal: No difficulty with gait.  Neurological: Alert and oriented.   BMET    Component Value Date/Time   NA 139 09/12/2023 1025   K 3.9 09/12/2023 1025   CL 108 09/12/2023 1025   CO2 23 09/12/2023 1025   GLUCOSE 88 09/12/2023 1025   BUN 32 (H) 09/12/2023 1025   CREATININE 2.08 (H) 09/12/2023 1025   CREATININE 2.17 (H) 06/07/2023 1257   CALCIUM  9.0 09/12/2023 1025   GFRNONAA 30 (L) 09/12/2023 1025   GFRAA 29 (L) 03/04/2020 0801    Lipid Panel     Component  Value Date/Time   CHOL 160 06/07/2023 1257   TRIG 142 06/07/2023 1257   HDL 61 06/07/2023 1257   CHOLHDL 2.6 06/07/2023 1257   LDLCALC 76 06/07/2023 1257    CBC    Component Value Date/Time   WBC 10.5 09/12/2023 1025   WBC 10.9 (H) 06/07/2023 1257   RBC 3.40 (L) 09/12/2023 1025   HGB 10.6 (L) 09/12/2023 1025   HCT 33.7 (L) 09/12/2023 1025   PLT 280 09/12/2023 1025   MCV 99.1 09/12/2023 1025   MCH 31.2 09/12/2023 1025   MCHC 31.5 09/12/2023 1025   RDW 13.8 09/12/2023 1025   LYMPHSABS 3.0 09/12/2023 1025   MONOABS 0.9 09/12/2023 1025   EOSABS 0.5 09/12/2023 1025   BASOSABS 0.1 09/12/2023 1025    Hgb A1C Lab Results  Component Value Date   HGBA1C 5.2 06/07/2023            Assessment & Plan:  Assessment and Plan    Allergic Rhinitis with nasal turbinate hypertrophy Chronic allergic rhinitis with nasal congestion and post-nasal drainage. No systemic illness, no antibiotics needed. Discussed potential ENT referral if symptoms persist. - Continue Claritin  daily. - Continue Nasonex  nasal spray daily. - Prescribe steroids taper for nasal congestion. - Consider ENT referral if symptoms persist.        RTC in 2 months for follow-up of chronic conditions Helayne Lo, NP

## 2023-10-03 NOTE — Telephone Encounter (Signed)
 He is able to read the note in mychart. Essentially, patient said he did not feel bad but the nasal congestion was the biggest problem. The inside of his nose is swollen, and I recommend he continue zyrtec, nasonex . I did think he needed to see ENT to see if they could do anything about the swollen turbinates but he didn't want to seen ENT, he wanted to try another round of steroids first. He has no infection and does not need antibiotics.

## 2023-10-03 NOTE — Telephone Encounter (Signed)
 Son advised per [facility] Record and verbalized understanding. No further questions or concerns at this time.

## 2023-10-15 ENCOUNTER — Inpatient Hospital Stay
Admission: EM | Admit: 2023-10-15 | Discharge: 2023-11-11 | DRG: 871 | Disposition: E | Attending: Pulmonary Disease | Admitting: Pulmonary Disease

## 2023-10-15 ENCOUNTER — Emergency Department

## 2023-10-15 ENCOUNTER — Other Ambulatory Visit: Payer: Self-pay

## 2023-10-15 DIAGNOSIS — F419 Anxiety disorder, unspecified: Secondary | ICD-10-CM | POA: Diagnosis present

## 2023-10-15 DIAGNOSIS — Z79890 Hormone replacement therapy (postmenopausal): Secondary | ICD-10-CM

## 2023-10-15 DIAGNOSIS — A419 Sepsis, unspecified organism: Secondary | ICD-10-CM | POA: Diagnosis present

## 2023-10-15 DIAGNOSIS — I129 Hypertensive chronic kidney disease with stage 1 through stage 4 chronic kidney disease, or unspecified chronic kidney disease: Secondary | ICD-10-CM | POA: Diagnosis present

## 2023-10-15 DIAGNOSIS — N184 Chronic kidney disease, stage 4 (severe): Secondary | ICD-10-CM | POA: Diagnosis present

## 2023-10-15 DIAGNOSIS — Z66 Do not resuscitate: Secondary | ICD-10-CM | POA: Diagnosis present

## 2023-10-15 DIAGNOSIS — R6521 Severe sepsis with septic shock: Secondary | ICD-10-CM | POA: Diagnosis present

## 2023-10-15 DIAGNOSIS — Z1152 Encounter for screening for COVID-19: Secondary | ICD-10-CM | POA: Diagnosis not present

## 2023-10-15 DIAGNOSIS — E872 Acidosis, unspecified: Secondary | ICD-10-CM | POA: Insufficient documentation

## 2023-10-15 DIAGNOSIS — K449 Diaphragmatic hernia without obstruction or gangrene: Secondary | ICD-10-CM | POA: Diagnosis present

## 2023-10-15 DIAGNOSIS — Z515 Encounter for palliative care: Secondary | ICD-10-CM

## 2023-10-15 DIAGNOSIS — F32A Depression, unspecified: Secondary | ICD-10-CM | POA: Diagnosis present

## 2023-10-15 DIAGNOSIS — Z7982 Long term (current) use of aspirin: Secondary | ICD-10-CM

## 2023-10-15 DIAGNOSIS — R1013 Epigastric pain: Secondary | ICD-10-CM | POA: Diagnosis present

## 2023-10-15 DIAGNOSIS — Z87891 Personal history of nicotine dependence: Secondary | ICD-10-CM

## 2023-10-15 DIAGNOSIS — J449 Chronic obstructive pulmonary disease, unspecified: Secondary | ICD-10-CM | POA: Diagnosis present

## 2023-10-15 DIAGNOSIS — R072 Precordial pain: Secondary | ICD-10-CM | POA: Diagnosis present

## 2023-10-15 DIAGNOSIS — Z79899 Other long term (current) drug therapy: Secondary | ICD-10-CM

## 2023-10-15 DIAGNOSIS — Z8551 Personal history of malignant neoplasm of bladder: Secondary | ICD-10-CM | POA: Diagnosis not present

## 2023-10-15 DIAGNOSIS — I493 Ventricular premature depolarization: Secondary | ICD-10-CM | POA: Diagnosis present

## 2023-10-15 DIAGNOSIS — J9601 Acute respiratory failure with hypoxia: Secondary | ICD-10-CM | POA: Diagnosis present

## 2023-10-15 DIAGNOSIS — J982 Interstitial emphysema: Secondary | ICD-10-CM | POA: Diagnosis present

## 2023-10-15 DIAGNOSIS — M549 Dorsalgia, unspecified: Secondary | ICD-10-CM | POA: Diagnosis present

## 2023-10-15 DIAGNOSIS — K223 Perforation of esophagus: Secondary | ICD-10-CM | POA: Diagnosis present

## 2023-10-15 LAB — BRAIN NATRIURETIC PEPTIDE: B Natriuretic Peptide: 57.3 pg/mL (ref 0.0–100.0)

## 2023-10-15 LAB — CBC WITH DIFFERENTIAL/PLATELET
Abs Immature Granulocytes: 0.1 10*3/uL — ABNORMAL HIGH (ref 0.00–0.07)
Basophils Absolute: 0 10*3/uL (ref 0.0–0.1)
Basophils Relative: 0 %
Eosinophils Absolute: 0.3 10*3/uL (ref 0.0–0.5)
Eosinophils Relative: 2 %
HCT: 32.4 % — ABNORMAL LOW (ref 39.0–52.0)
Hemoglobin: 10.4 g/dL — ABNORMAL LOW (ref 13.0–17.0)
Immature Granulocytes: 1 %
Lymphocytes Relative: 15 %
Lymphs Abs: 2.7 10*3/uL (ref 0.7–4.0)
MCH: 31.9 pg (ref 26.0–34.0)
MCHC: 32.1 g/dL (ref 30.0–36.0)
MCV: 99.4 fL (ref 80.0–100.0)
Monocytes Absolute: 0.9 10*3/uL (ref 0.1–1.0)
Monocytes Relative: 5 %
Neutro Abs: 13.7 10*3/uL — ABNORMAL HIGH (ref 1.7–7.7)
Neutrophils Relative %: 77 %
Platelets: 384 10*3/uL (ref 150–400)
RBC: 3.26 MIL/uL — ABNORMAL LOW (ref 4.22–5.81)
RDW: 13.8 % (ref 11.5–15.5)
WBC: 17.7 10*3/uL — ABNORMAL HIGH (ref 4.0–10.5)
nRBC: 0 % (ref 0.0–0.2)

## 2023-10-15 LAB — COMPREHENSIVE METABOLIC PANEL WITH GFR
ALT: 28 U/L (ref 0–44)
AST: 25 U/L (ref 15–41)
Albumin: 3.4 g/dL — ABNORMAL LOW (ref 3.5–5.0)
Alkaline Phosphatase: 59 U/L (ref 38–126)
Anion gap: 14 (ref 5–15)
BUN: 30 mg/dL — ABNORMAL HIGH (ref 8–23)
CO2: 25 mmol/L (ref 22–32)
Calcium: 9 mg/dL (ref 8.9–10.3)
Chloride: 101 mmol/L (ref 98–111)
Creatinine, Ser: 2.01 mg/dL — ABNORMAL HIGH (ref 0.61–1.24)
GFR, Estimated: 31 mL/min — ABNORMAL LOW (ref >60)
Glucose, Bld: 127 mg/dL — ABNORMAL HIGH (ref 70–99)
Potassium: 4.1 mmol/L (ref 3.5–5.1)
Sodium: 140 mmol/L (ref 135–145)
Total Bilirubin: 0.6 mg/dL (ref 0.0–1.2)
Total Protein: 6.3 g/dL — ABNORMAL LOW (ref 6.5–8.1)

## 2023-10-15 LAB — TROPONIN I (HIGH SENSITIVITY)
Troponin I (High Sensitivity): 7 ng/L (ref <18)
Troponin I (High Sensitivity): 9 ng/L (ref <18)

## 2023-10-15 LAB — LACTIC ACID, PLASMA
Lactic Acid, Venous: 2.6 mmol/L (ref 0.5–1.9)
Lactic Acid, Venous: 3.1 mmol/L (ref 0.5–1.9)

## 2023-10-15 LAB — RESP PANEL BY RT-PCR (RSV, FLU A&B, COVID)  RVPGX2
Influenza A by PCR: NEGATIVE
Influenza B by PCR: NEGATIVE
Resp Syncytial Virus by PCR: NEGATIVE
SARS Coronavirus 2 by RT PCR: NEGATIVE

## 2023-10-15 LAB — MRSA NEXT GEN BY PCR, NASAL: MRSA by PCR Next Gen: NOT DETECTED

## 2023-10-15 LAB — LIPASE, BLOOD: Lipase: 95 U/L — ABNORMAL HIGH (ref 11–51)

## 2023-10-15 LAB — GLUCOSE, CAPILLARY: Glucose-Capillary: 76 mg/dL (ref 70–99)

## 2023-10-15 MED ORDER — ONDANSETRON HCL 4 MG/2ML IJ SOLN
4.0000 mg | Freq: Once | INTRAMUSCULAR | Status: AC
  Administered 2023-10-15: 4 mg via INTRAVENOUS
  Filled 2023-10-15: qty 2

## 2023-10-15 MED ORDER — ACETAMINOPHEN 650 MG RE SUPP: 650.0000 mg | Freq: Four times a day (QID) | RECTAL | Status: DC | PRN

## 2023-10-15 MED ORDER — IOHEXOL 9 MG/ML PO SOLN
500.0000 mL | ORAL | Status: AC
  Administered 2023-10-15: 500 mL via ORAL

## 2023-10-15 MED ORDER — LACTATED RINGERS IV BOLUS
1000.0000 mL | Freq: Once | INTRAVENOUS | Status: AC
  Administered 2023-10-15: 1000 mL via INTRAVENOUS

## 2023-10-15 MED ORDER — MORPHINE SULFATE (PF) 2 MG/ML IV SOLN
1.0000 mg | INTRAVENOUS | Status: DC | PRN
  Administered 2023-10-15: 1 mg via INTRAVENOUS
  Filled 2023-10-15: qty 1

## 2023-10-15 MED ORDER — GLYCOPYRROLATE 1 MG PO TABS: 1.0000 mg | ORAL_TABLET | ORAL | Status: DC | PRN

## 2023-10-15 MED ORDER — METRONIDAZOLE 500 MG/100ML IV SOLN
500.0000 mg | Freq: Once | INTRAVENOUS | Status: AC
  Administered 2023-10-15: 500 mg via INTRAVENOUS
  Filled 2023-10-15: qty 100

## 2023-10-15 MED ORDER — MORPHINE SULFATE (PF) 2 MG/ML IV SOLN
2.0000 mg | INTRAVENOUS | Status: DC | PRN
  Administered 2023-10-15 (×3): 2 mg via INTRAVENOUS
  Filled 2023-10-15 (×3): qty 1

## 2023-10-15 MED ORDER — GLYCOPYRROLATE 0.2 MG/ML IJ SOLN: 0.2000 mg | INTRAMUSCULAR | Status: DC | PRN

## 2023-10-15 MED ORDER — ONDANSETRON 4 MG PO TBDP: 4.0000 mg | ORAL_TABLET | Freq: Four times a day (QID) | ORAL | Status: DC | PRN

## 2023-10-15 MED ORDER — VANCOMYCIN HCL IN DEXTROSE 1-5 GM/200ML-% IV SOLN
1000.0000 mg | Freq: Once | INTRAVENOUS | Status: AC
  Administered 2023-10-15: 1000 mg via INTRAVENOUS
  Filled 2023-10-15: qty 200

## 2023-10-15 MED ORDER — LACTATED RINGERS IV BOLUS (SEPSIS): 250.0000 mL | Freq: Once | INTRAVENOUS | Status: DC

## 2023-10-15 MED ORDER — DOCUSATE SODIUM 100 MG PO CAPS: 100.0000 mg | ORAL_CAPSULE | Freq: Two times a day (BID) | ORAL | Status: DC | PRN

## 2023-10-15 MED ORDER — MORPHINE SULFATE (PF) 4 MG/ML IV SOLN
4.0000 mg | Freq: Once | INTRAVENOUS | Status: AC
  Administered 2023-10-15: 4 mg via INTRAVENOUS
  Filled 2023-10-15: qty 1

## 2023-10-15 MED ORDER — LACTATED RINGERS IV BOLUS (SEPSIS)
1000.0000 mL | Freq: Once | INTRAVENOUS | Status: AC
  Administered 2023-10-15: 1000 mL via INTRAVENOUS

## 2023-10-15 MED ORDER — SODIUM CHLORIDE 0.9% FLUSH: 3.0000 mL | Freq: Two times a day (BID) | INTRAVENOUS | Status: DC

## 2023-10-15 MED ORDER — SODIUM CHLORIDE 0.9 % IV BOLUS
1000.0000 mL | Freq: Once | INTRAVENOUS | Status: AC
  Administered 2023-10-15: 1000 mL via INTRAVENOUS

## 2023-10-15 MED ORDER — SODIUM CHLORIDE 0.9% FLUSH: 3.0000 mL | INTRAVENOUS | Status: DC | PRN

## 2023-10-15 MED ORDER — NOREPINEPHRINE 4 MG/250ML-% IV SOLN
INTRAVENOUS | Status: AC
Start: 2023-10-15 — End: 2023-10-15
  Administered 2023-10-15: 2 ug/min via INTRAVENOUS
  Filled 2023-10-15: qty 250

## 2023-10-15 MED ORDER — SODIUM CHLORIDE 0.9 % IV SOLN
2.0000 g | Freq: Once | INTRAVENOUS | Status: AC
  Administered 2023-10-15: 2 g via INTRAVENOUS
  Filled 2023-10-15: qty 12.5

## 2023-10-15 MED ORDER — PANTOPRAZOLE SODIUM 40 MG IV SOLR
80.0000 mg | Freq: Once | INTRAVENOUS | Status: AC
  Administered 2023-10-15: 80 mg via INTRAVENOUS
  Filled 2023-10-15: qty 20

## 2023-10-15 MED ORDER — POLYVINYL ALCOHOL 1.4 % OP SOLN: 1.0000 [drp] | Freq: Four times a day (QID) | OPHTHALMIC | Status: DC | PRN

## 2023-10-15 MED ORDER — LACTATED RINGERS IV BOLUS (SEPSIS): 500.0000 mL | Freq: Once | INTRAVENOUS | Status: DC

## 2023-10-15 MED ORDER — PANTOPRAZOLE SODIUM 40 MG IV SOLR
40.0000 mg | Freq: Two times a day (BID) | INTRAVENOUS | Status: DC
  Administered 2023-10-15: 40 mg via INTRAVENOUS
  Filled 2023-10-15: qty 10

## 2023-10-15 MED ORDER — ORAL CARE MOUTH RINSE: 15.0000 mL | OROMUCOSAL | Status: DC | PRN

## 2023-10-15 MED ORDER — NOREPINEPHRINE 4 MG/250ML-% IV SOLN: 0.0000 ug/min | INTRAVENOUS | Status: DC

## 2023-10-15 MED ORDER — FENTANYL CITRATE PF 50 MCG/ML IJ SOSY
50.0000 ug | PREFILLED_SYRINGE | Freq: Once | INTRAMUSCULAR | Status: AC
  Administered 2023-10-15: 50 ug via INTRAVENOUS
  Filled 2023-10-15 (×2): qty 1

## 2023-10-15 MED ORDER — MIDAZOLAM HCL 2 MG/2ML IJ SOLN: 2.0000 mg | INTRAMUSCULAR | Status: DC | PRN

## 2023-10-15 MED ORDER — ACETAMINOPHEN 325 MG PO TABS: 650.0000 mg | ORAL_TABLET | Freq: Four times a day (QID) | ORAL | Status: DC | PRN

## 2023-10-15 MED ORDER — ONDANSETRON HCL 4 MG/2ML IJ SOLN: 4.0000 mg | Freq: Four times a day (QID) | INTRAMUSCULAR | Status: DC | PRN

## 2023-10-15 MED ORDER — POLYETHYLENE GLYCOL 3350 17 G PO PACK: 17.0000 g | PACK | Freq: Every day | ORAL | Status: DC | PRN

## 2023-10-15 MED ORDER — IOHEXOL 350 MG/ML SOLN
75.0000 mL | Freq: Once | INTRAVENOUS | Status: AC | PRN
Start: 2023-10-15 — End: 2023-10-15
  Administered 2023-10-15: 75 mL via INTRAVENOUS

## 2023-10-15 MED ORDER — PIPERACILLIN-TAZOBACTAM IN DEX 2-0.25 GM/50ML IV SOLN
2.2500 g | Freq: Three times a day (TID) | INTRAVENOUS | Status: DC
  Filled 2023-10-15: qty 50

## 2023-10-15 MED ORDER — IPRATROPIUM-ALBUTEROL 0.5-2.5 (3) MG/3ML IN SOLN: 3.0000 mL | Freq: Four times a day (QID) | RESPIRATORY_TRACT | Status: DC | PRN

## 2023-10-19 ENCOUNTER — Telehealth: Payer: Self-pay | Admitting: Internal Medicine

## 2023-10-19 ENCOUNTER — Telehealth: Payer: Self-pay

## 2023-10-19 NOTE — Telephone Encounter (Signed)
 I called patient's son, Athena Bland and offered my condolences.  Family very thankful for the call. Appreciate the cancer center staff for the care and support provided to the patient.   GB  FYI-

## 2023-10-19 NOTE — Telephone Encounter (Signed)
 Copied from CRM [id number]. Topic: General - Deceased Patient >> Oct 31, 2023  9:10 AM Crispin Dolphin wrote: Name of caller: Dianah Fort - [phone] - was not sure if provider new that father is deceased. Does not know right now if anything else is needed from provider. Will call back.   Date of death: 10-Nov-2023  10-27-2023  Name of funeral home: N/A  Phone number of funeral home: N/A  Provider that needs to sign form: N/A  Timeline for signing: N/A

## 2023-10-19 NOTE — Telephone Encounter (Signed)
 Called son to express my condolences

## 2023-10-20 LAB — CULTURE, BLOOD (ROUTINE X 2)
Culture: NO GROWTH
Culture: NO GROWTH
Special Requests: ADEQUATE

## 2023-10-22 ENCOUNTER — Telehealth: Payer: Self-pay

## 2023-10-22 NOTE — Telephone Encounter (Signed)
 Copied from CRM [id number]. Topic: General - Other >> Oct 22, 2023 10:29 AM CMA Alisia Irons wrote: I have called the son and directed him the direction to get the results he is looking for.

## 2023-11-11 NOTE — Significant Event (Signed)
 Patient expired 1904 while on comfort care.  Family was at bedside and belongings were returned to family on prior shift. This RN was second Charity fundraiser to verify time of death and night intensivist was notified.  Body was prepped for transport to morgue.

## 2023-11-11 NOTE — ED Notes (Signed)
 RT to bedside

## 2023-11-11 NOTE — ED Provider Notes (Signed)
 Patient received in signout from Dr. Demetrios Finders pending CT imaging for patient presents with epigastric pain after overnight emesis.  History of hiatal hernia and GERD.  As below, patient found to have pneumomediastinum and concern for perforated esophagus.  Unfortunately developed septic shock and requires vasopressors.  I multiple conversations with CT surgery, [place] and at [facility], who did not believe patient would be a surgical candidate.  I consulted the ICU at our facility who agrees to admit for medical management and comfort measures.   After discussing with patient and family multiplications, patient is DNR/DNI.   Clinical Course as of 11/08/2023 1020  Mon Oct 15, 2023  0745 Call from radiology, pneumomediastinum, large hiatal hernia, uncertain if he has esophageal perforation.  Recommend repeat CT chest with p.o. contrast. [DS]  5366 I called over to our CT department to ensure appropriate order and updated them of need for oral contrast administration.  Will add metronidazole to his antibiotic coverage considering possible enteric source, as well as high dose PPI [DS]  0804 Reassessed just prior to him going back to CT for this additional scan.  I update his son and grandson at the bedside.  We discussed possible severity of this pathology, transfer, surgery.  Pending additional imaging [DS]  0907 Worsening hypoxia and blood pressures.  Now on a nonrebreather, continuing fluids.  No indications for pressors yet.  Still waiting on CT results but go ahead and reach out to cardiothoracic surgeon for consultation. [DS]  4403 I get a circulator nurse for Dr. Luna Salinas, CT surgery at [facility]. They're all busy, scrubbed in on cases. Recommend transfer to [facility] or [facility] [DS]  0945 Have secretary page [facility] [DS]  [phone] I talk to [facility] transfer center coordinator who takes pertinent patient info [DS]  [phone] 18g USIV right basilic v [DS]  3875 I have long discussion with patient and both of his sons at the  bedside and we discussed CODE STATUS and answered their questions to the best of my ability.  They are agreeable with DNR/DNI but maximizing medication management including vasopressors, possible operative intervention, but no CPR and no later. [DS]  1002 Dr. Lydia Sams, thoracic surgery at [facility], we discussed patient's presentation, imaging, declining hemodynamics.  He does not think patient is a surgical candidate and would not benefit from transfer.  Recommends medical management, comfort measures as likely patient will ultimately expire from this insult [DS]  1015 I update patient of this conversation as well as his 2 sons at the bedside.  We discussed poor prognosis, septic shock and surgical recommendations for medical management only.  They understand that he has a poor prognosis and could expire from this.  I will page ICU for admission [DS]  1018 I consult with Dr. Lucina Sabal, ICU physician here, he agrees to admit [DS]    Clinical Course User Index [DS] Arline Bennett, MD    .1-3 Lead EKG Interpretation  Performed by: Arline Bennett, MD Authorized by: Arline Bennett, MD     Interpretation: abnormal     ECG rate:  110   ECG rate assessment: tachycardic     Rhythm: sinus tachycardia     Ectopy: none     Conduction: normal   .Critical Care  Performed by: Arline Bennett, MD Authorized by: Arline Bennett, MD   Critical care provider statement:    Critical care time (minutes):  75   Critical care time was exclusive of:  Separately billable procedures and treating other patients   Critical care was  necessary to treat or prevent imminent or life-threatening deterioration of the following conditions:  Shock, sepsis and respiratory failure   Critical care was time spent personally by me on the following activities:  Development of treatment plan with patient or surrogate, discussions with consultants, evaluation of patient's response to treatment, examination of patient, ordering and review of laboratory  studies, ordering and review of radiographic studies, ordering and performing treatments and interventions, pulse oximetry, re-evaluation of patient's condition and review of old charts .Ultrasound ED Peripheral IV (Provider)  Date/Time: 11/01/2023 10:20 AM  Performed by: Arline Bennett, MD Authorized by: Arline Bennett, MD   Procedure details:    Indications: multiple failed IV attempts and poor IV access     Skin Prep: chlorhexidine  gluconate     Location: right basilic v.   Angiocath:  18 G   Bedside Ultrasound Guided: Yes     Images: not archived     Patient tolerated procedure without complications: Yes     Dressing applied: Yes        Arline Bennett, MD 11/03/2023 1022

## 2023-11-11 NOTE — Progress Notes (Addendum)
 Belongings (pants, wallet, change, socks) given to patients sister theresa

## 2023-11-11 NOTE — ED Notes (Signed)
Pt was taken to CT

## 2023-11-11 NOTE — Progress Notes (Signed)
   10/31/2023 1030  Spiritual Encounters  Type of Visit Initial  Care provided to: Pt and family  Conversation partners present during encounter Nurse  Reason for visit Routine spiritual support  OnCall Visit No   Unit Chaplain received message from On-Call Chaplain that ED called about this patient having received troubling news about his health.  Dr. shared with Chaplain that patient would likely be moved to ICU without intubation and other life sustaining measures.  Family was tearful but at peace and shared these are also the wishes of the patient.  Chaplain offered seating for additional family arriving and prayed with the family and patient.  Chaplain also shared with ICU Chaplain to offer additional care, as needed.  Rev. Rana M. Nolon Baxter, M.Div. Chaplain Resident [facility]

## 2023-11-11 NOTE — Death Summary Note (Cosign Needed Addendum)
 DEATH SUMMARY   Patient Details  Name: Edward Cummings MRN: [id number] DOB: 1933/05/11  Admission/Discharge Information   Admit Date:  11/09/2023  Date of Death: Date of Death: 11/09/2023  Time of Death: Time of Death: November 16, 1902  Length of Stay: 0  Referring Physician: Carollynn Cirri, NP   Reason(s) for Hospitalization  Mid sternal chest pain with pneumomediastinum and possible esophageal perforation  Diagnoses  Preliminary cause of death: acute hypoxic respiratory failure s/t pneumomediastinum Secondary Diagnoses (including complications and co-morbidities):  Principal Problem:   Septic shock (HCC) Active Problems:   CKD (chronic kidney disease), stage IV (HCC)   Acute hypoxic respiratory failure (HCC)   Lactic acidosis   Pneumomediastinum (HCC)   Large hiatal hernia   Brief Hospital Course (including significant findings, care, treatment, and services provided and events leading to death)  Edward Cummings is a 89 y.o. year old male who  presented to [facility] ER via EMS on 2023-11-09 from home with c/o mid sternal chest pain which is worse when he takes a deep breath and shortness of breath. He also endorsed back pain when he lays flat. His symptoms started 1 hour prior to presentation.  EMS reported the pt informed them he had 2 episodes of vomiting prior to going to bed last night.     ED Course  In the ER CXR negative for acute cardiopulmonary abnormalities.  Significant lab results were: glucose 127/BUN 30/creatinine 2.01/albumin 3.4/lipase 95/lactic 2.6/wbc 17.7/hgb 10.4.  Pt received cefepime/metronidazole/iv protonix/ vancomycin/fentanyl /morphine/4L iv fluid bolus.  Despite aggressive iv fluid resuscitation pt became hypotensive requiring levophed gtt.  CTA Chest/Abd/ Pelvis revealed persistent and progression of pneumomediastinum with air surrounding the hiatal hernia, distal esophagus extending superiorly into the lower neck, unable to exclude esophageal perforation.  EDP contacted [facility]  transfer center and spoke with thoracic surgeon Dr. Lydia Sams.  Per Dr. Lydia Sams pt NOT a surgical candidate and would NOT benefit from transfer.  Dr. Lydia Sams recommended medical management, comfort measures as likely pt will ultimately expire from this insult.  Also, per [place] thoracic surgery pt NOT a surgical candidate.  Following goals of care conversations pt and pts family changed code status to DNR/DNI.  They also decided to continue with medical management for now unless pt starts to decline further.  PCCM team contacted for ICU admission.   11/09/23: Admitted to ICU with septic shock and acute hypoxic respiratory failure secondary to pneumomediastinum, large hiatal hernia with inability to exclude esophageal perforation. Pt with non rebreather in place mild respiratory distress denies pain requiring levophed gtt @4  mcg/min to maintain map 65 or higher. Over the next few hours the patient's shock & respiratory failure significantly worsened. Goals of care were discussed, given worsening clinical status with ongoing esophageal perforation and worsening pneumomediastinum, shock without any possible interventions, we elected to proceed with CMO and focus on Mr. Dwinell comfort. Patient passed with family bedside.  Pertinent Labs and Studies  Significant Diagnostic Studies CT CHEST WO CONTRAST Result Date: 11/09/2023 CLINICAL DATA:  Esophageal perforation, pneumomediastinum. EXAM: CT CHEST WITHOUT CONTRAST TECHNIQUE: Multidetector CT imaging of the chest was performed following the standard protocol without IV contrast. RADIATION DOSE REDUCTION: This exam was performed according to the departmental dose-optimization program which includes automated exposure control, adjustment of the mA and/or kV according to patient size and/or use of iterative reconstruction technique. COMPARISON:  CT chest angio 2023-11-09 and Nov 04, 2022 FINDINGS: Cardiovascular: No significant vascular findings. Normal heart size. No pericardial  effusion.  Mediastinum/Nodes: Comparison with the prior examination earlier today there is persistent and progression of pneumomediastinum with air surrounding the hiatal hernia, distal esophagus extending superiorly into the lower neck. There is significant dilution of the water-soluble oral contrast however, no obvious extravasation of contrast material is noted within the mediastinum or surrounding the hiatal hernia. No change in the pulmonary infiltrates or nodular parenchymal densities previously described on the recent dictation. Particularly in the right lower lobe and again follow-up CT in 3 months recommended. Upper Abdomen: No acute abnormality. Musculoskeletal: No chest wall mass or suspicious bone lesions identified. IMPRESSION: *Persistent and progression of pneumomediastinum with air surrounding the hiatal hernia, distal esophagus extending superiorly into the lower neck. There is significant dilution of the water-soluble oral contrast however, no obvious extravasation of contrast material is noted within the mediastinum or surrounding the hiatal hernia. *No change in the pulmonary infiltrates or nodular parenchymal densities previously described on the recent dictation. Particularly in the right lower lobe and again follow-up CT in 3 months recommended. Electronically Signed   By: Fredrich Jefferson M.D.   On: 10/20/2023 09:49   CT Angio Chest/Abd/Pel for Dissection W and/or Wo Contrast Result Date: 11/10/2023 CLINICAL DATA:  Acute aortic syndrome Suspected. Midsternal chest pain that increases with deep breath. Shortness of breath.History of bladder cancer. EXAM: CT ANGIOGRAPHY CHEST, ABDOMEN AND PELVIS TECHNIQUE: Non-contrast CT of the chest was initially obtained. Multidetector CT imaging through the chest, abdomen and pelvis was performed using the standard protocol during bolus administration of intravenous contrast. Multiplanar reconstructed images and MIPs were obtained and reviewed to evaluate the  vascular anatomy. RADIATION DOSE REDUCTION: This exam was performed according to the departmental dose-optimization program which includes automated exposure control, adjustment of the mA and/or kV according to patient size and/or use of iterative reconstruction technique. CONTRAST:  75mL OMNIPAQUE IOHEXOL 350 MG/ML SOLN COMPARISON:  CT chest, abdomen and pelvis from 09/04/2022 FINDINGS: CTA CHEST FINDINGS Cardiovascular: Preferential opacification of the thoracic aorta. No evidence of thoracic aortic aneurysm or dissection. Heart size is normal. Aortic atherosclerosis and multi vessel coronary artery calcifications. No pericardial effusion. Mediastinum/Nodes: Thyroid  gland, trachea and esophagus demonstrates no significant findings. Large hiatal hernia is identified. This contains the distal portions of the gastric body and the antrum. The left side of the hernia sac appears fluid-filled contains gas, fluid and debris. Pneumomediastinum is identified which extends the length of the esophagus into the lower neck. There are no enlarged mediastinal or hilar lymph nodes. Lungs/Pleura: Signs of chronic interstitial lung disease identified with progressive interstitial reticulation in volume loss within both lower lobes. Within the posterolateral right upper lobe there is bronchiectasis with mucous plugging, image 107/7. Posterior nodular density within the right upper lobe measures 1.5 cm, image 60/7 and is new from previous exam. Nodule within the posterior right lower lobe is also new measuring 6 mm, image 94/7. Emphysema with diffuse bronchial wall thickening. Musculoskeletal: Osteopenia. No acute or suspicious osseous findings. Review of the MIP images confirms the above findings. CTA ABDOMEN AND PELVIS FINDINGS VASCULAR Aorta: Normal caliber aorta without aneurysm, dissection, vasculitis or significant stenosis. Celiac: Patent. Calcified plaque at the origin of the celiac artery with moderate severe stenosis which  appears non rate limiting. No signs of aneurysm or dissection SMA: Patent. Calcified plaque at the origin of the celiac artery with moderate severe stenosis which appears non rate limiting. No signs of aneurysm or dissection Renals: Bilateral renal arteries are patent. Calcified plaque at the origin of both renal  arteries with resultant moderate to severe stenosis without signs of dissection or aneurysm. IMA: Patent. Inflow: Bilateral iliac vessels are patent. Extensive aortic atherosclerotic calcifications noted. Focal, non rate limiting dissection is identified involving the right common iliac artery, image 62/8. Veins: No obvious venous abnormality within the limitations of this arterial phase study. Review of the MIP images confirms the above findings. NON-VASCULAR Hepatobiliary: No focal liver abnormality is seen. No gallstones, gallbladder wall thickening, or biliary dilatation. Pancreas: Unremarkable. No pancreatic ductal dilatation or surrounding inflammatory changes. Spleen: Normal in size without focal abnormality. Adrenals/Urinary Tract: Normal adrenal glands. Bilateral Bosniak class 1 and class 2 kidney cysts are identified. The largest cyst arises off the lateral right kidney measuring 2.4 cm, image 190/5. No follow-up imaging recommended. No signs of nephrolithiasis or obstructive uropathy. No focal bladder abnormality. Stomach/Bowel: The proximal stomach is distended with air-fluid level. There is a large hiatal hernia which contains the distal body and antrum of stomach which also appear distended with fluid. There is also gas, fluid and debris within the left side of the hiatal hernia. Lymphatic: No signs of abdominopelvic adenopathy. Reproductive: Prostate is unremarkable. Ovoid structure within the proximal right inguinal canal is felt to represent the right testis. Other: No significant free fluid or fluid collections. No signs of pneumoperitoneum. Musculoskeletal: Osteopenia. No acute or  suspicious osseous findings. Review of the MIP images confirms the above findings. IMPRESSION: 1. No signs of thoracic or abdominal aortic aneurysm or dissection. 2. Large hiatal hernia is identified which contains the distal body and antrum of stomach which appears distended with fluid. There is also gas, fluid and debris within the left side of the hiatal hernia sac. Pneumomediastinum is identified which extends the length of the esophagus into the lower neck. Presence of pneumomediastinum is of uncertain clinical significance, most commonly associated with barotrauma. Esophageal perforation is not excluded. Consider repeat CT of the chest following oral administration of water-soluble contrast material. 3. Signs of chronic interstitial lung disease with progressive interstitial reticulation and volume loss within both lower lobes. 4. New nodular densities within the right upper lobe and right lower lobe. The largest measures 1.5 cm. Recommend repeat imaging of the chest in 3 months to address potentially inflammatory or infectious process. If persistent then PET-CT would be advised in this patient who has a history of bladder cancer. 5. Focal, non rate limiting dissection involving the right common iliac artery. 6. Ovoid structure within the proximal right inguinal canal is felt to represent the right testis. 7. Aortic Atherosclerosis (ICD10-I70.0) and Emphysema (ICD10-J43.9). Critical Value/emergent results were called by telephone at the time of interpretation on 10/22/2023 at 7:53 am to provider Dr. Felipe Horton, who verbally acknowledged these results. Electronically Signed   By: Kimberley Penman M.D.   On: 10/29/2023 07:53   DG Chest Port 1 View Result Date: 10/27/2023 CLINICAL DATA:  89 year old male with midsternal chest pain, pleuritic pain acute onset. EXAM: PORTABLE CHEST 1 VIEW COMPARISON:  Chest radiographs 02/27/2023 and earlier. FINDINGS: Portable AP semi upright view at 0536 hours. Lower lung volumes.  Emphysema on chest CT last year. Moderate to large chronic hiatal hernia. Stable cardiac size and mediastinal contours. Visualized tracheal air column is within normal limits. No pneumothorax or pulmonary edema. No consolidation or definite pleural effusion. No acute osseous abnormality identified. Increased bowel gas in the left upper quadrant is nonspecific. IMPRESSION: Emphysema ([id number]) with smaller lung volumes. Chronic hiatal hernia. No acute cardiopulmonary abnormality identified. Electronically Signed   By: Marvina Slough  Del Favia M.D.   On: 10/24/2023 05:54    Microbiology Recent Results (from the past 240 hours)  Resp panel by RT-PCR (RSV, Flu A&B, Covid) Anterior Nasal Swab     Status: None   Collection Time: 10/17/2023  5:54 AM   Specimen: Anterior Nasal Swab  Result Value Ref Range Status   SARS Coronavirus 2 by RT PCR NEGATIVE NEGATIVE Final    Comment: (NOTE) SARS-CoV-2 target nucleic acids are NOT DETECTED.  The SARS-CoV-2 RNA is generally detectable in upper respiratory specimens during the acute phase of infection. The lowest concentration of SARS-CoV-2 viral copies this assay can detect is 138 copies/mL. A negative result does not preclude SARS-Cov-2 infection and should not be used as the sole basis for treatment or other patient management decisions. A negative result may occur with  improper specimen collection/handling, submission of specimen other than nasopharyngeal swab, presence of viral mutation(s) within the areas targeted by this assay, and inadequate number of viral copies(<138 copies/mL). A negative result must be combined with clinical observations, patient history, and epidemiological information. The expected result is Negative.  Fact Sheet for Patients:  BloggerCourse.com  Fact Sheet for Healthcare Providers:  SeriousBroker.it  This test is no t yet approved or cleared by the [place]  FDA and  has been  authorized for detection and/or diagnosis of SARS-CoV-2 by FDA under an Emergency Use Authorization (EUA). This EUA will remain  in effect (meaning this test can be used) for the duration of the COVID-19 declaration under Section 564(b)(1) of the Act, 21 U.S.C.section 360bbb-3(b)(1), unless the authorization is terminated  or revoked sooner.       Influenza A by PCR NEGATIVE NEGATIVE Final   Influenza B by PCR NEGATIVE NEGATIVE Final    Comment: (NOTE) The Xpert Xpress SARS-CoV-2/FLU/RSV plus assay is intended as an aid in the diagnosis of influenza from Nasopharyngeal swab specimens and should not be used as a sole basis for treatment. Nasal washings and aspirates are unacceptable for Xpert Xpress SARS-CoV-2/FLU/RSV testing.  Fact Sheet for Patients: BloggerCourse.com  Fact Sheet for Healthcare Providers: SeriousBroker.it  This test is not yet approved or cleared by the [place]  FDA and has been authorized for detection and/or diagnosis of SARS-CoV-2 by FDA under an Emergency Use Authorization (EUA). This EUA will remain in effect (meaning this test can be used) for the duration of the COVID-19 declaration under Section 564(b)(1) of the Act, 21 U.S.C. section 360bbb-3(b)(1), unless the authorization is terminated or revoked.     Resp Syncytial Virus by PCR NEGATIVE NEGATIVE Final    Comment: (NOTE) Fact Sheet for Patients: BloggerCourse.com  Fact Sheet for Healthcare Providers: SeriousBroker.it  This test is not yet approved or cleared by the [place]  FDA and has been authorized for detection and/or diagnosis of SARS-CoV-2 by FDA under an Emergency Use Authorization (EUA). This EUA will remain in effect (meaning this test can be used) for the duration of the COVID-19 declaration under Section 564(b)(1) of the Act, 21 U.S.C. section 360bbb-3(b)(1), unless the  authorization is terminated or revoked.  Performed at [facility], [address] [place], [place] [address]   MRSA Next Gen by PCR, Nasal     Status: None   Collection Time: 10/28/2023 11:23 AM   Specimen: Nasal Mucosa; Nasal Swab  Result Value Ref Range Status   MRSA by PCR Next Gen NOT DETECTED NOT DETECTED Final    Comment: (NOTE) The GeneXpert MRSA Assay (FDA approved for NASAL specimens only), is one  component of a comprehensive MRSA colonization surveillance program. It is not intended to diagnose MRSA infection nor to guide or monitor treatment for MRSA infections. Test performance is not FDA approved in patients less than 60 years old. Performed at [facility], [address] [place], [place] [address]     Lab Basic Metabolic Panel: Recent Labs  Lab 11/08/2023 0515  NA 140  K 4.1  CL 101  CO2 25  GLUCOSE 127*  BUN 30*  CREATININE 2.01*  CALCIUM  9.0   Liver Function Tests: Recent Labs  Lab 11/06/2023 0515  AST 25  ALT 28  ALKPHOS 59  BILITOT 0.6  PROT 6.3*  ALBUMIN 3.4*   Recent Labs  Lab 10/12/2023 0515  LIPASE 95*   No results for input(s): "AMMONIA" in the last 168 hours. CBC: Recent Labs  Lab 10/14/2023 0515  WBC 17.7*  NEUTROABS 13.7*  HGB 10.4*  HCT 32.4*  MCV 99.4  PLT 384   Cardiac Enzymes: No results for input(s): "CKTOTAL", "CKMB", "CKMBINDEX", "TROPONINI" in the last 168 hours. Sepsis Labs: Recent Labs  Lab 11/06/2023 0515 11/03/2023 0845  WBC 17.7*  --   LATICACIDVEN 2.6* 3.1*    Procedures/Operations  N/A  Recardo Canal Rust-Chester, AGACNP-BC Acute Care Nurse Practitioner [facility]   [phone] / [phone] Please see Amion for details.

## 2023-11-11 NOTE — ED Provider Notes (Signed)
 [facility] Provider Note    Event Date/Time   First MD Initiated Contact with Patient 10/17/2023 [phone]     (approximate)   History   Chest Pain   HPI  Edward Cummings is a 89 y.o. male with a history of hypertension, COPD, CKD, anemia, and anxiety who presents with chest pain, acute onset about an hour ago, associated with shortness of breath and worse if he takes a deep breath.  It is in the lower part of the chest in the center.  He states he feels it into his back as well when he lies flat.  He states that he vomited twice prior to the pain starting.  I reviewed the past medical records per the patient's most recent outpatient encounter was with family medicine on 4/23 for nasal congestion and cough.   Physical Exam   Triage Vital Signs: ED Triage Vitals  Encounter Vitals Group     BP 10/16/2023 0446 (!) 85/55     Systolic BP Percentile --      Diastolic BP Percentile --      Pulse Rate 10/14/2023 0446 80     Resp 11/09/2023 0446 19     Temp 10/29/2023 0450 (!) 97.1 F (36.2 C)     Temp Source 10/18/2023 0450 Oral     SpO2 11/05/2023 0446 100 %     Weight --      Height --      Head Circumference --      Peak Flow --      Pain Score 10/12/2023 0450 10     Pain Loc --      Pain Education --      Exclude from Growth Chart --     Most recent vital signs: Vitals:   10/29/2023 0530 11/04/2023 0545  BP: (!) 96/54 104/65  Pulse: 77 81  Resp: (!) 22 19  Temp:    SpO2: 97% 92%     General: Alert, uncomfortable appearing, no distress.  CV:  Good peripheral perfusion.   Resp:  Normal effort.  Lungs CTAB. Abd:  Soft and nontender.  No distention.  Other:  No peripheral edema.   ED Results / Procedures / Treatments   Labs (all labs ordered are listed, but only abnormal results are displayed) Labs Reviewed  COMPREHENSIVE METABOLIC PANEL WITH GFR - Abnormal; Notable for the following components:      Result Value   Glucose, Bld 127 (*)    BUN 30 (*)     Creatinine, Ser 2.01 (*)    Total Protein 6.3 (*)    Albumin 3.4 (*)    GFR, Estimated 31 (*)    All other components within normal limits  CBC WITH DIFFERENTIAL/PLATELET - Abnormal; Notable for the following components:   WBC 17.7 (*)    RBC 3.26 (*)    Hemoglobin 10.4 (*)    HCT 32.4 (*)    Neutro Abs 13.7 (*)    Abs Immature Granulocytes 0.10 (*)    All other components within normal limits  LIPASE, BLOOD - Abnormal; Notable for the following components:   Lipase 95 (*)    All other components within normal limits  LACTIC ACID, PLASMA - Abnormal; Notable for the following components:   Lactic Acid, Venous 2.6 (*)    All other components within normal limits  RESP PANEL BY RT-PCR (RSV, FLU A&B, COVID)  [id number]  CULTURE, BLOOD (ROUTINE X 2)  CULTURE, BLOOD (ROUTINE X 2)  BRAIN NATRIURETIC PEPTIDE  LACTIC ACID, PLASMA  TROPONIN I (HIGH SENSITIVITY)  TROPONIN I (HIGH SENSITIVITY)     EKG  ED ECG REPORT I, Lind Repine, the attending physician, personally viewed and interpreted this ECG.  Date: 10/17/2023 EKG Time: 0455 Rate: 78 Rhythm: normal sinus rhythm with PVCs QRS Axis: normal Intervals: Nonspecific IVCD ST/T Wave abnormalities: normal Narrative Interpretation: no evidence of acute ischemia    RADIOLOGY  Chest x-ray: I independently viewed and interpreted the images; there is no focal consolidation or edema   PROCEDURES:  Critical Care performed: Yes, see critical care procedure note(s)  .Critical Care  Performed by: Lind Repine, MD Authorized by: Lind Repine, MD   Critical care provider statement:    Critical care time (minutes):  30   Critical care time was exclusive of:  Separately billable procedures and treating other patients   Critical care was necessary to treat or prevent imminent or life-threatening deterioration of the following conditions:  Sepsis   Critical care was time spent personally by me on the following  activities:  Development of treatment plan with patient or surrogate, discussions with consultants, evaluation of patient's response to treatment, examination of patient, ordering and review of laboratory studies, ordering and review of radiographic studies, ordering and performing treatments and interventions, pulse oximetry, re-evaluation of patient's condition, review of old charts and obtaining history from patient or surrogate   Care discussed with: admitting provider      MEDICATIONS ORDERED IN ED: Medications  lactated ringers  bolus 1,000 mL (has no administration in time range)  ceFEPIme (MAXIPIME) 2 g in sodium chloride  0.9 % 100 mL IVPB (has no administration in time range)  vancomycin (VANCOCIN) IVPB 1000 mg/200 mL premix (has no administration in time range)  fentaNYL  (SUBLIMAZE ) injection 50 mcg (has no administration in time range)  iohexol (OMNIPAQUE) 350 MG/ML injection 75 mL (has no administration in time range)  ondansetron  (ZOFRAN ) injection 4 mg (4 mg Intravenous Given 10/31/2023 0532)  morphine (PF) 4 MG/ML injection 4 mg (4 mg Intravenous Given 10/17/2023 0536)  sodium chloride  0.9 % bolus 1,000 mL (1,000 mLs Intravenous New Bag/Given 10/16/2023 0532)     IMPRESSION / MDM / ASSESSMENT AND PLAN / ED COURSE  I reviewed the triage vital signs and the nursing notes.  89 year old male with PMH as noted above presents with acute onset of lower chest/epigastric pain about an hour ago associated with some shortness of breath and preceded by vomiting.  On exam the patient is uncomfortable appearing but in no acute distress.  Blood pressure is low but other vital signs are normal.  Physical exam is otherwise unremarkable for acute findings.  Differential diagnosis includes, but is not limited to, ACS, pancreatitis, gastritis, GERD, gastroenteritis, aspiration pneumonitis or pneumonia, other bacterial pneumonia, acute bronchitis.  I have a low suspicion for PE given the lack of tachycardia or  hypoxia and the location and nature of the pain.  Aortic dissection is a consideration, however the patient's vital signs as well as the location and quality the pain do not appear consistent with this.  He does not have a strong history of vascular disease.  We will obtain chest x-ray, lab workup, give analgesia, fluids, and reassess.  Patient's presentation is most consistent with acute presentation with potential threat to life or bodily function.  The patient is on the cardiac monitor to evaluate for evidence of arrhythmia and/or significant heart rate changes.   ----------------------------------------- 6:54 AM on 11/07/2023 -----------------------------------------  Chest x-ray  shows no acute findings.  Labs so far significant for lactic acid of 2.6 and WBC count of 17.7.  Lipase is also somewhat elevated.  Workup is concerning for sepsis.  Blood pressure has improved somewhat with fluids.  I have ordered additional fluids and broad-spectrum antibiotics per the sepsis protocol.  Given the location and quality of the pain, aortic dissection or other vascular etiology is a possibility, and the pain is right between the chest and abdomen.  Therefore, we will obtain a CTA of the chest, abdomen, and pelvis for further evaluation.  The patient will need admission once the workup is complete.  He will be signed out to the oncoming ED physician at 7 AM.   FINAL CLINICAL IMPRESSION(S) / ED DIAGNOSES   Final diagnoses:  Epigastric pain     Rx / DC Orders   ED Discharge Orders     None        Note:  This document was prepared using Dragon voice recognition software and may include unintentional dictation errors.    Lind Repine, MD 10/14/2023 [phone]

## 2023-11-11 NOTE — H&P (Signed)
 NAME:  Edward Cummings, MRN:  [id number], DOB:  [birth year], LOS: 0 ADMISSION DATE:  10/24/2023, CONSULTATION DATE: 10/25/2023 REFERRING MD: Dr. Felipe Horton, CHIEF COMPLAINT: Mid sternal Chest Pain    History of Present Illness:  This is an 89 yo male who presented to [facility] ER via EMS on 05/5 from home with c/o mid sternal chest pain which is worse when he takes a deep breath and shortness of breath. He also endorsed back pain when he lays flat. His symptoms started 1 hour prior to presentation.  EMS reported the pt informed them he had 2 episodes of vomiting prior to going to bed last night.    ED Course  In the ER CXR negative for acute cardiopulmonary abnormalities.  Significant lab results were: glucose 127/BUN 30/creatinine 2.01/albumin 3.4/lipase 95/lactic 2.6/wbc 17.7/hgb 10.4.  Pt received cefepime/metronidazole/iv protonix/ vancomycin/fentanyl /morphine/4L iv fluid bolus.  Despite aggressive iv fluid resuscitation pt became hypotensive requiring levophed gtt.  CTA Chest/Abd/ Pelvis revealed persistent and progression of pneumomediastinum with air surrounding the hiatal hernia, distal esophagus extending superiorly into the lower neck, unable to exclude esophageal perforation.  EDP contacted [facility] transfer center and spoke with thoracic surgeon Dr. Lydia Sams.  Per Dr. Lydia Sams pt NOT a surgical candidate and would NOT benefit from transfer.  Dr. Lydia Sams recommended medical management, comfort measures as likely pt will ultimately expire from this insult.  Also, per [place] thoracic surgery pt NOT a surgical candidate.  Following goals of care conversations pt and pts family changed code status to DNR/DNI.  They also decided to continue with medical management for now unless pt starts to decline further.  PCCM team contacted for ICU admission.   Pertinent  Medical History  Anemia  Anxiety  Bladder Cancer  Depression  HTN  Neuromuscular Disorder CKD   Significant Hospital Events: Including procedures, antibiotic  start and stop dates in addition to other pertinent events   05/5: Admitted to ICU with septic shock and acute hypoxic respiratory failure secondary to pneumomediastinum, large hiatal hernia with inability to exclude esophageal perforation requiring levophed gtt   Interim History / Subjective:  Pt with non rebreather in place mild respiratory distress denies pain requiring levophed gtt @4  mcg/min to maintain map 65 or higher   Objective   Blood pressure (!) 125/98, pulse (!) 119, temperature 99.6 F (37.6 C), temperature source Axillary, resp. rate (!) 21, weight 53.7 kg, SpO2 100%.        Intake/Output Summary (Last 24 hours) at 11/10/2023 1147 Last data filed at 11/03/2023 0709 Gross per 24 hour  Intake 1000 ml  Output --  Net 1000 ml   Filed Weights   10/17/2023 1124  Weight: 53.7 kg   Examination: General: Acutely-ill appearing male, mild respiratory distress  HENT: Supple, no JVD  Lungs: Diffuse pleural rub throughout, mildly tachypneic  Cardiovascular: Sinus tachycardia, s1s2, no m/r/g, 2+ radial/1+ distal pulses, no edema  Abdomen: +BS x4, taut, tenderness present, non distended  Extremities: Normal bulk and tone, moves all extremities  Neuro: Alert and oriented, follows commands, PERRLA  GU: Deferred   Resolved Hospital Problem list     Assessment & Plan:   #Septic shock  - Continuous telemetry monitoring  - Aggressive iv fluid resuscitation and prn levophed gtt to maintain map 65 or higher - Hold outpatient po cardiac medications   #CKD stage IV  #Lactic acidosis  - Trend BMP and lactic acid  - Replace electrolytes as indicated  - Strict I&O's - Avoid nephrotoxic agents as  able   #Acute hypoxic respiratory failure  #Pneumomediastinum  #Large hiatal hernia with inability to exclude esophageal perforation Hx: COPD  - Deemed NOT a surgical candidate per thoracic surgery  - Supplemental O2 for dyspnea and/or hypoxia  - Maintain O2 sats 88 to 92% - Avoid Bipap   - Prn bronchodilator therapy  - Prn morphine for pain management  - PPI bid  - Keep NPO   #Sepsis  - Trend WBC and monitor fever curve  - Follow cultures  - Will start zosyn pending culture results and sensitivities   #Endo  - CBG's q4hrs  - Follow hyper/hypoglycemic protocol  - Target CBG's 140 to 180   Pts prognosis extremely poor with HIGH likelihood of cardiac arrest and sudden death.  Pt is DNR/DNI.  Recommend transitioning to comfort measures only. Pts family would like to continue with current care as outlined above, and if pt declines will transition to comfort measures only Best Practice (right click and "Reselect all SmartList Selections" daily)   Diet/type: NPO DVT prophylaxis SCD Pressure ulcer(s): N/A GI prophylaxis: PPI Lines: N/A Foley:  N/A Code Status:  DNR Last date of multidisciplinary goals of care discussion [N/A]  Labs   CBC: Recent Labs  Lab 10/20/2023 0515  WBC 17.7*  NEUTROABS 13.7*  HGB 10.4*  HCT 32.4*  MCV 99.4  PLT 384    Basic Metabolic Panel: Recent Labs  Lab 11/05/2023 0515  NA 140  K 4.1  CL 101  CO2 25  GLUCOSE 127*  BUN 30*  CREATININE 2.01*  CALCIUM  9.0   GFR: Estimated Creatinine Clearance: 18.9 mL/min (A) (by C-G formula based on SCr of 2.01 mg/dL (H)). Recent Labs  Lab 11/04/2023 0515 11/05/2023 0845  WBC 17.7*  --   LATICACIDVEN 2.6* 3.1*    Liver Function Tests: Recent Labs  Lab 11/07/2023 0515  AST 25  ALT 28  ALKPHOS 59  BILITOT 0.6  PROT 6.3*  ALBUMIN 3.4*   Recent Labs  Lab 10/12/2023 0515  LIPASE 95*   No results for input(s): "AMMONIA" in the last 168 hours.  ABG No results found for: "PHART", "PCO2ART", "PO2ART", "HCO3", "TCO2", "ACIDBASEDEF", "O2SAT"   Coagulation Profile: No results for input(s): "INR", "PROTIME" in the last 168 hours.  Cardiac Enzymes: No results for input(s): "CKTOTAL", "CKMB", "CKMBINDEX", "TROPONINI" in the last 168 hours.  HbA1C: HbA1c, POC (prediabetic range)   Date/Time Value Ref Range Status  01/03/2023 11:42 AM 5.7 5.7 - 6.4 % Final   Hgb A1c MFr Bld  Date/Time Value Ref Range Status  06/07/2023 12:57 PM 5.2 <5.7 % of total Hgb Final    Comment:    For the purpose of screening for the presence of diabetes: . <5.7%       Consistent with the absence of diabetes 5.7-6.4%    Consistent with increased risk for diabetes             (prediabetes) > or =6.5%  Consistent with diabetes . This assay result is consistent with a decreased risk of diabetes. . Currently, no consensus exists regarding use of hemoglobin A1c for diagnosis of diabetes in children. . According to American Diabetes Association (ADA) guidelines, hemoglobin A1c <7.0% represents optimal control in non-pregnant diabetic patients. Different metrics may apply to specific patient populations.  Standards of Medical Care in Diabetes(ADA). .   04/13/2018 07:29 PM 4.5 (L) 4.8 - 5.6 % Final    Comment:    (NOTE) Pre diabetes:  5.7%-6.4% Diabetes:              >6.4% Glycemic control for   <7.0% adults with diabetes     CBG: No results for input(s): "GLUCAP" in the last 168 hours.  Review of Systems: Positives in BOLD   Gen: Denies fever, chills, weight change, fatigue, night sweats HEENT: Denies blurred vision, double vision, hearing loss, tinnitus, sinus congestion, rhinorrhea, sore throat, neck stiffness, dysphagia PULM: shortness of breath, cough, sputum production, hemoptysis, wheezing CV: chest and back pain, edema, orthopnea, paroxysmal nocturnal dyspnea, palpitations GI: abdominal pain, nausea, vomiting, diarrhea, hematochezia, melena, constipation, change in bowel habits GU: Denies dysuria, hematuria, polyuria, oliguria, urethral discharge Endocrine: Denies hot or cold intolerance, polyuria, polyphagia or appetite change Derm: Denies rash, dry skin, scaling or peeling skin change Heme: Denies easy bruising, bleeding, bleeding gums Neuro: Denies  headache, numbness, weakness, slurred speech, loss of memory or consciousness  Past Medical History:  He,  has a past medical history of Anemia, Anxiety (12/31/2019), Cancer (HCC), Chronic kidney disease, Depression, Hypertension, and Neuromuscular disorder (HCC).   Surgical History:   Past Surgical History:  Procedure Laterality Date   BIOPSY  03/08/2023   Procedure: BIOPSY;  Surgeon: Luke Salaam, MD;  Location: [facility] ENDOSCOPY;  Service: Gastroenterology;;   Orin Birk W/ RETROGRADES Bilateral 01/25/2018   Procedure: CYSTOSCOPY WITH RETROGRADE PYELOGRAM;  Surgeon: Geraline Knapp, MD;  [name];  Service: Urology;  Laterality: Bilateral;   CYSTOSCOPY W/ URETERAL STENT PLACEMENT Bilateral 01/06/2019   Procedure: CYSTOSCOPY WITH RETROGRADE PYELOGRAM/URETERAL STENT REMOVAL;  Surgeon: Geraline Knapp, MD;  [name];  Service: Urology;  Laterality: Bilateral;   CYSTOSCOPY WITH BIOPSY N/A 09/21/2020   Procedure: CYSTOSCOPY WITH BLADDER BIOPSY;  Surgeon: Geraline Knapp, MD;  [name];  Service: Urology;  [name];   CYSTOSCOPY WITH FULGERATION N/A 09/21/2020   Procedure: CYSTOSCOPY WITH FULGERATION;  Surgeon: Geraline Knapp, MD;  [name];  Service: Urology;  [name];   CYSTOSCOPY WITH STENT PLACEMENT Bilateral 01/25/2018   Procedure: CYSTOSCOPY WITH STENT PLACEMENT;  Surgeon: Geraline Knapp, MD;  [name];  Service: Urology;  Laterality: Bilateral;   DORSAL SLIT N/A 01/06/2019   Procedure: DORSAL SLIT;  Surgeon: Geraline Knapp, MD;  [name];  Service: Urology;  [name];   ESOPHAGOGASTRODUODENOSCOPY (EGD) WITH PROPOFOL  N/A 11/12/2019   Procedure: ESOPHAGOGASTRODUODENOSCOPY (EGD) WITH PROPOFOL ;  Surgeon: Irby Mannan, MD;  Location: [facility] ENDOSCOPY;  Service: Endoscopy;  [name];   ESOPHAGOGASTRODUODENOSCOPY (EGD) WITH PROPOFOL  N/A 03/08/2023   Procedure: ESOPHAGOGASTRODUODENOSCOPY (EGD) WITH  PROPOFOL ;  Surgeon: Luke Salaam, MD;  Location: [facility] ENDOSCOPY;  Service: Gastroenterology;  [name];   EYE SURGERY     Cornea transplants bilaterally & cataract surgery.   TRANSURETHRAL RESECTION OF BLADDER TUMOR N/A 01/25/2018   Procedure: TRANSURETHRAL RESECTION OF BLADDER TUMOR (TURBT);  Surgeon: Geraline Knapp, MD;  [name];  Service: Urology;  [name];     Social History:   reports that he quit smoking about 15 years ago. His smoking use included cigarettes. He has quit using smokeless tobacco.  His smokeless tobacco use included chew. He reports current alcohol use of about 2.0 standard drinks of alcohol per week. He reports that he does not use drugs.   Family History:  His family history is negative for Prostate cancer, Kidney cancer, Bladder Cancer, and Cancer - Prostate.   Allergies No Known Allergies   Home Medications  Prior to  Admission medications   Medication Sig Start Date End Date Taking? Authorizing Provider  amLODipine  (NORVASC ) 10 MG tablet TAKE ONE TABLET BY MOUTH ONCE DAILY Patient taking differently: Take 5 mg by mouth daily. 05/30/23  Yes Carollynn Cirri, NP  aspirin  EC 81 MG tablet Take 81 mg by mouth daily.    [provider]  atorvastatin  (LIPITOR) 10 MG tablet TAKE ONE TABLET (10 MG TOTAL) BY MOUTH DAILY. 05/29/23   Carollynn Cirri, NP  Budeson-Glycopyrrol-Formoterol (BREZTRI  AEROSPHERE) 160-9-4.8 MCG/ACT AERO Inhale 2 puffs into the lungs 2 (two) times daily. 08/06/23   Carollynn Cirri, NP  ipratropium (ATROVENT ) 0.06 % nasal spray PLACE TWO SPRAYS INTO BOTH NOSTRILS FOUR (FOUR) TIMES DAILY, FOR UP TO 5-7 DAYS, THEN STOP. 09/14/23   Carollynn Cirri, NP  levothyroxine  (SYNTHROID ) 75 MCG tablet TAKE ONE TABLET BY MOUTH EVERY MORNING BEFORE BREAKFAST 08/20/23   Nelda Balsam, NP  loratadine  (CLARITIN ) 10 MG tablet TAKE 1 TABLET BY MOUTH ONCE A DAY 12/26/21   Theron Flavin, MD  mometasone  (NASONEX ) 50 MCG/ACT nasal spray PLACE TWO  SPRAYS INTO THE NOSE DAILY Patient not taking: Reported on 10/03/2023 12/08/22   Carollynn Cirri, NP  Multiple Vitamin (MULTIVITAMIN WITH MINERALS) TABS tablet Take 1 tablet by mouth daily.     [provider]  omeprazole  (PRILOSEC) 40 MG capsule Take 1 capsule (40 mg total) by mouth daily. 01/01/23   Carollynn Cirri, NP  prednisoLONE  acetate (PRED FORTE ) 1 % ophthalmic suspension PLACE ONE DROP INTO EACH EYE DAILY AT TWO PM. 09/14/23   Carollynn Cirri, NP  predniSONE  (DELTASONE ) 10 MG tablet Take 6 tabs on day 1, 5 tabs on day 2, 4 tabs on day 3, 3 tabs on day 4, 2 tabs on day 5, 1 tab on day 6 10/03/23   Carollynn Cirri, NP     Critical care time: 70 minutes       Janey Meek, AGNP  Pulmonary/Critical Care Pager [phone] (please enter 7 digits) PCCM Consult Pager [phone] (please enter 7 digits) a

## 2023-11-11 NOTE — ED Triage Notes (Addendum)
 Per ems pt coming from home after family called d/t pt experiencing midsternal cp that increases when he takes a deep breath and SOB that started 1 hour ago. Per ems pt had 2 episodes of vomiting prior to going to bed. EMS sts they heard "pleural rub" I the lower lobes.

## 2023-11-11 NOTE — IPAL (Signed)
  Interdisciplinary Goals of Care Family Meeting   Date carried out: 10/13/2023  Location of the meeting: Bedside  Member's involved: Physician, Bedside Registered Nurse, and Family Member or next of kin  Durable Power of Attorney or acting medical decision maker: Sons Athena Bland and Aimee Houseman   Discussion: We discussed goals of care for [employer] .  Given worsening clinical status with ongoing esophageal perforation worsening pneumomediastinum, shock without any possible interventions, we elected to proceed with CMO and focus on Mr. Alarcon comfort.   Code status:   Code Status: Do not attempt resuscitation (DNR) - Comfort care   Disposition: In-patient comfort care  Time spent for the meeting: 10 minutes.     Annitta Kindler, MD  10/22/2023, 2:55 PM

## 2023-11-11 NOTE — Plan of Care (Signed)
  Problem: Coping: Goal: Level of anxiety will decrease Outcome: Progressing   Problem: Pain Managment: Goal: General experience of comfort will improve and/or be controlled Outcome: Progressing   Problem: Education: Goal: Knowledge of General Education information will improve Description: Including pain rating scale, medication(s)/side effects and non-pharmacologic comfort measures Outcome: Not Progressing   Problem: Health Behavior/Discharge Planning: Goal: Ability to manage health-related needs will improve Outcome: Not Progressing   Problem: Clinical Measurements: Goal: Ability to maintain clinical measurements within normal limits will improve Outcome: Not Progressing

## 2023-11-11 NOTE — ED Notes (Signed)
 Lab called for repeat labs/BC

## 2023-11-11 NOTE — ED Notes (Signed)
 RT called for bedside assessment, MD aware.

## 2023-11-11 NOTE — Progress Notes (Signed)
   10/28/2023 1900  Spiritual Encounters  Type of Visit Initial  Care provided to: Family  Reason for visit Patient death  OnCall Visit No  Interventions  Spiritual Care Interventions Made Established relationship of care and support;Compassionate presence;Reflective listening;Bereavement/grief support  Intervention Outcomes  Outcomes Connection to spiritual care   Chaplain provided compassionate presence for the family. Chaplain supported family members and offered encouraging words. Chaplain spoke with the on-call Chaplain so they were aware of what took place.

## 2023-11-11 DEATH — deceased

## 2023-12-06 ENCOUNTER — Ambulatory Visit: Payer: Self-pay | Admitting: Internal Medicine

## 2024-03-12 ENCOUNTER — Other Ambulatory Visit

## 2024-03-12 ENCOUNTER — Ambulatory Visit

## 2024-03-12 ENCOUNTER — Ambulatory Visit: Admitting: Internal Medicine

## 2024-05-14 ENCOUNTER — Other Ambulatory Visit: Payer: Self-pay | Admitting: Urology
